# Patient Record
Sex: Male | Born: 1955 | Race: White | Hispanic: No | Smoking: Former smoker
Health system: Southern US, Community
[De-identification: ages and names within clinical notes are randomized; demographics above are authoritative.]

## PROBLEM LIST (undated history)

## (undated) DIAGNOSIS — I10 Essential (primary) hypertension: Secondary | ICD-10-CM

## (undated) DIAGNOSIS — I5032 Chronic diastolic (congestive) heart failure: Secondary | ICD-10-CM

## (undated) DIAGNOSIS — R51 Headache: Secondary | ICD-10-CM

## (undated) DIAGNOSIS — G8929 Other chronic pain: Secondary | ICD-10-CM

## (undated) DIAGNOSIS — F32A Depression, unspecified: Secondary | ICD-10-CM

## (undated) DIAGNOSIS — I2510 Atherosclerotic heart disease of native coronary artery without angina pectoris: Secondary | ICD-10-CM

## (undated) DIAGNOSIS — M25559 Pain in unspecified hip: Secondary | ICD-10-CM

## (undated) DIAGNOSIS — F419 Anxiety disorder, unspecified: Secondary | ICD-10-CM

## (undated) DIAGNOSIS — F1411 Cocaine abuse, in remission: Secondary | ICD-10-CM

## (undated) DIAGNOSIS — K219 Gastro-esophageal reflux disease without esophagitis: Secondary | ICD-10-CM

## (undated) DIAGNOSIS — F1011 Alcohol abuse, in remission: Secondary | ICD-10-CM

## (undated) DIAGNOSIS — I509 Heart failure, unspecified: Secondary | ICD-10-CM

## (undated) DIAGNOSIS — J449 Chronic obstructive pulmonary disease, unspecified: Secondary | ICD-10-CM

## (undated) DIAGNOSIS — M1990 Unspecified osteoarthritis, unspecified site: Secondary | ICD-10-CM

## (undated) DIAGNOSIS — M549 Dorsalgia, unspecified: Secondary | ICD-10-CM

## (undated) DIAGNOSIS — F329 Major depressive disorder, single episode, unspecified: Secondary | ICD-10-CM

## (undated) DIAGNOSIS — E785 Hyperlipidemia, unspecified: Secondary | ICD-10-CM

## (undated) DIAGNOSIS — E669 Obesity, unspecified: Secondary | ICD-10-CM

## (undated) DIAGNOSIS — J9851 Mediastinitis: Secondary | ICD-10-CM

## (undated) DIAGNOSIS — J189 Pneumonia, unspecified organism: Secondary | ICD-10-CM

## (undated) HISTORY — DX: Depression, unspecified: F32.A

## (undated) HISTORY — DX: Hyperlipidemia, unspecified: E78.5

## (undated) HISTORY — PX: CHOLECYSTECTOMY: SHX55

## (undated) HISTORY — DX: Other chronic pain: G89.29

## (undated) HISTORY — DX: Major depressive disorder, single episode, unspecified: F32.9

## (undated) HISTORY — DX: Dorsalgia, unspecified: M54.9

## (undated) HISTORY — PX: BACK SURGERY: SHX140

## (undated) HISTORY — PX: HIP SURGERY: SHX245

## (undated) HISTORY — DX: Cocaine abuse, in remission: F14.11

## (undated) HISTORY — DX: Obesity, unspecified: E66.9

## (undated) HISTORY — PX: NASAL/SINUS ENDOSCOPY: SHX288

## (undated) HISTORY — DX: Alcohol abuse, in remission: F10.11

## (undated) HISTORY — DX: Chronic obstructive pulmonary disease, unspecified: J44.9

## (undated) HISTORY — PX: CORONARY ARTERY BYPASS GRAFT: SHX141

## (undated) HISTORY — PX: TOTAL HIP ARTHROPLASTY: SHX124

## (undated) HISTORY — DX: Anxiety disorder, unspecified: F41.9

## (undated) HISTORY — PX: JOINT REPLACEMENT: SHX530

---

## 2005-10-19 ENCOUNTER — Ambulatory Visit: Payer: Self-pay | Admitting: Cardiology

## 2005-10-19 ENCOUNTER — Observation Stay (HOSPITAL_COMMUNITY): Admission: EM | Admit: 2005-10-19 | Discharge: 2005-10-20 | Payer: Self-pay | Admitting: Cardiology

## 2006-03-18 ENCOUNTER — Ambulatory Visit: Payer: Self-pay | Admitting: Family Medicine

## 2006-03-18 ENCOUNTER — Inpatient Hospital Stay (HOSPITAL_COMMUNITY): Admission: EM | Admit: 2006-03-18 | Discharge: 2006-03-18 | Payer: Self-pay | Admitting: Emergency Medicine

## 2006-03-28 ENCOUNTER — Inpatient Hospital Stay (HOSPITAL_COMMUNITY): Admission: EM | Admit: 2006-03-28 | Discharge: 2006-03-31 | Payer: Self-pay | Admitting: *Deleted

## 2006-03-29 ENCOUNTER — Encounter (INDEPENDENT_AMBULATORY_CARE_PROVIDER_SITE_OTHER): Payer: Self-pay | Admitting: Cardiovascular Disease

## 2006-04-07 ENCOUNTER — Ambulatory Visit: Payer: Self-pay | Admitting: Family Medicine

## 2006-04-19 ENCOUNTER — Ambulatory Visit: Payer: Self-pay | Admitting: Sports Medicine

## 2006-04-20 ENCOUNTER — Ambulatory Visit (HOSPITAL_COMMUNITY): Admission: RE | Admit: 2006-04-20 | Discharge: 2006-04-20 | Payer: Self-pay | Admitting: Internal Medicine

## 2006-04-20 ENCOUNTER — Encounter: Payer: Self-pay | Admitting: Vascular Surgery

## 2006-05-19 ENCOUNTER — Ambulatory Visit: Payer: Self-pay | Admitting: Sports Medicine

## 2006-06-08 ENCOUNTER — Emergency Department (HOSPITAL_COMMUNITY): Admission: EM | Admit: 2006-06-08 | Discharge: 2006-06-08 | Payer: Self-pay | Admitting: Emergency Medicine

## 2006-06-26 ENCOUNTER — Emergency Department (HOSPITAL_COMMUNITY): Admission: EM | Admit: 2006-06-26 | Discharge: 2006-06-26 | Payer: Self-pay | Admitting: Emergency Medicine

## 2006-07-12 ENCOUNTER — Ambulatory Visit: Payer: Self-pay | Admitting: Family Medicine

## 2006-08-09 ENCOUNTER — Emergency Department (HOSPITAL_COMMUNITY): Admission: EM | Admit: 2006-08-09 | Discharge: 2006-08-09 | Payer: Self-pay | Admitting: Emergency Medicine

## 2006-09-05 ENCOUNTER — Ambulatory Visit: Payer: Self-pay | Admitting: Sports Medicine

## 2006-09-06 ENCOUNTER — Ambulatory Visit (HOSPITAL_COMMUNITY): Admission: RE | Admit: 2006-09-06 | Discharge: 2006-09-06 | Payer: Self-pay | Admitting: Sports Medicine

## 2006-09-19 ENCOUNTER — Emergency Department (HOSPITAL_COMMUNITY): Admission: EM | Admit: 2006-09-19 | Discharge: 2006-09-19 | Payer: Self-pay | Admitting: Emergency Medicine

## 2006-10-04 ENCOUNTER — Ambulatory Visit: Payer: Self-pay | Admitting: Family Medicine

## 2006-10-20 ENCOUNTER — Ambulatory Visit: Payer: Self-pay | Admitting: Family Medicine

## 2006-10-20 DIAGNOSIS — M545 Low back pain, unspecified: Secondary | ICD-10-CM | POA: Insufficient documentation

## 2006-10-20 DIAGNOSIS — I739 Peripheral vascular disease, unspecified: Secondary | ICD-10-CM | POA: Insufficient documentation

## 2006-10-20 DIAGNOSIS — F32A Depression, unspecified: Secondary | ICD-10-CM | POA: Insufficient documentation

## 2006-10-20 DIAGNOSIS — J449 Chronic obstructive pulmonary disease, unspecified: Secondary | ICD-10-CM | POA: Insufficient documentation

## 2006-10-20 DIAGNOSIS — I10 Essential (primary) hypertension: Secondary | ICD-10-CM | POA: Insufficient documentation

## 2006-10-20 DIAGNOSIS — F329 Major depressive disorder, single episode, unspecified: Secondary | ICD-10-CM

## 2006-10-25 ENCOUNTER — Encounter (INDEPENDENT_AMBULATORY_CARE_PROVIDER_SITE_OTHER): Payer: Self-pay | Admitting: Family Medicine

## 2006-12-30 ENCOUNTER — Telehealth: Payer: Self-pay | Admitting: Family Medicine

## 2007-01-19 ENCOUNTER — Ambulatory Visit: Payer: Self-pay | Admitting: Family Medicine

## 2007-01-19 ENCOUNTER — Encounter: Payer: Self-pay | Admitting: Family Medicine

## 2007-01-19 DIAGNOSIS — E785 Hyperlipidemia, unspecified: Secondary | ICD-10-CM | POA: Insufficient documentation

## 2007-01-19 DIAGNOSIS — F411 Generalized anxiety disorder: Secondary | ICD-10-CM | POA: Insufficient documentation

## 2007-01-19 LAB — CONVERTED CEMR LAB
BUN: 17 mg/dL (ref 6–23)
Calcium: 9.6 mg/dL (ref 8.4–10.5)
Creatinine, Ser: 1.05 mg/dL (ref 0.40–1.50)
Direct LDL: 105 mg/dL — ABNORMAL HIGH
Glucose, Bld: 74 mg/dL (ref 70–99)
Potassium: 3.8 meq/L (ref 3.5–5.3)

## 2007-01-20 ENCOUNTER — Encounter: Payer: Self-pay | Admitting: Family Medicine

## 2007-01-29 ENCOUNTER — Inpatient Hospital Stay (HOSPITAL_COMMUNITY): Admission: EM | Admit: 2007-01-29 | Discharge: 2007-02-02 | Payer: Self-pay | Admitting: Emergency Medicine

## 2007-01-29 ENCOUNTER — Ambulatory Visit: Payer: Self-pay | Admitting: Internal Medicine

## 2007-02-01 ENCOUNTER — Ambulatory Visit: Payer: Self-pay | Admitting: Cardiology

## 2007-02-02 ENCOUNTER — Other Ambulatory Visit: Payer: Self-pay | Admitting: Internal Medicine

## 2007-03-03 ENCOUNTER — Emergency Department (HOSPITAL_COMMUNITY): Admission: EM | Admit: 2007-03-03 | Discharge: 2007-03-03 | Payer: Self-pay | Admitting: Emergency Medicine

## 2007-03-10 ENCOUNTER — Emergency Department (HOSPITAL_COMMUNITY): Admission: EM | Admit: 2007-03-10 | Discharge: 2007-03-10 | Payer: Self-pay | Admitting: Emergency Medicine

## 2007-06-13 ENCOUNTER — Emergency Department (HOSPITAL_COMMUNITY): Admission: EM | Admit: 2007-06-13 | Discharge: 2007-06-13 | Payer: Self-pay | Admitting: Emergency Medicine

## 2007-07-10 ENCOUNTER — Encounter (INDEPENDENT_AMBULATORY_CARE_PROVIDER_SITE_OTHER): Payer: Self-pay | Admitting: General Surgery

## 2007-07-10 ENCOUNTER — Ambulatory Visit (HOSPITAL_COMMUNITY): Admission: RE | Admit: 2007-07-10 | Discharge: 2007-07-10 | Payer: Self-pay | Admitting: General Surgery

## 2007-07-13 ENCOUNTER — Emergency Department (HOSPITAL_COMMUNITY): Admission: EM | Admit: 2007-07-13 | Discharge: 2007-07-13 | Payer: Self-pay | Admitting: Emergency Medicine

## 2007-08-21 ENCOUNTER — Encounter (INDEPENDENT_AMBULATORY_CARE_PROVIDER_SITE_OTHER): Payer: Self-pay | Admitting: *Deleted

## 2007-08-21 ENCOUNTER — Ambulatory Visit: Payer: Self-pay | Admitting: Sports Medicine

## 2007-08-21 ENCOUNTER — Encounter (INDEPENDENT_AMBULATORY_CARE_PROVIDER_SITE_OTHER): Payer: Self-pay | Admitting: Family Medicine

## 2007-09-11 ENCOUNTER — Encounter
Admission: RE | Admit: 2007-09-11 | Discharge: 2007-09-11 | Payer: Self-pay | Admitting: Physical Medicine & Rehabilitation

## 2007-09-12 ENCOUNTER — Telehealth: Payer: Self-pay | Admitting: Family Medicine

## 2007-09-18 ENCOUNTER — Ambulatory Visit: Payer: Self-pay | Admitting: Family Medicine

## 2007-09-27 ENCOUNTER — Emergency Department (HOSPITAL_COMMUNITY): Admission: EM | Admit: 2007-09-27 | Discharge: 2007-09-27 | Payer: Self-pay | Admitting: Emergency Medicine

## 2007-09-29 ENCOUNTER — Encounter (INDEPENDENT_AMBULATORY_CARE_PROVIDER_SITE_OTHER): Payer: Self-pay | Admitting: Family Medicine

## 2007-10-05 ENCOUNTER — Ambulatory Visit: Payer: Self-pay | Admitting: Family Medicine

## 2007-10-05 DIAGNOSIS — K449 Diaphragmatic hernia without obstruction or gangrene: Secondary | ICD-10-CM | POA: Insufficient documentation

## 2007-10-06 ENCOUNTER — Telehealth (INDEPENDENT_AMBULATORY_CARE_PROVIDER_SITE_OTHER): Payer: Self-pay | Admitting: Family Medicine

## 2007-10-15 ENCOUNTER — Encounter: Admission: RE | Admit: 2007-10-15 | Discharge: 2007-10-15 | Payer: Self-pay | Admitting: Neurosurgery

## 2007-11-04 ENCOUNTER — Emergency Department (HOSPITAL_COMMUNITY): Admission: EM | Admit: 2007-11-04 | Discharge: 2007-11-04 | Payer: Self-pay | Admitting: Emergency Medicine

## 2007-11-10 ENCOUNTER — Ambulatory Visit: Payer: Self-pay | Admitting: Family Medicine

## 2007-11-20 ENCOUNTER — Emergency Department (HOSPITAL_COMMUNITY): Admission: EM | Admit: 2007-11-20 | Discharge: 2007-11-20 | Payer: Self-pay | Admitting: Emergency Medicine

## 2007-12-05 ENCOUNTER — Ambulatory Visit (HOSPITAL_COMMUNITY): Admission: RE | Admit: 2007-12-05 | Discharge: 2007-12-05 | Payer: Self-pay | Admitting: Family Medicine

## 2007-12-05 ENCOUNTER — Ambulatory Visit: Payer: Self-pay | Admitting: Family Medicine

## 2007-12-05 ENCOUNTER — Emergency Department (HOSPITAL_COMMUNITY): Admission: EM | Admit: 2007-12-05 | Discharge: 2007-12-05 | Payer: Self-pay | Admitting: Emergency Medicine

## 2007-12-06 ENCOUNTER — Telehealth (INDEPENDENT_AMBULATORY_CARE_PROVIDER_SITE_OTHER): Payer: Self-pay | Admitting: Family Medicine

## 2007-12-11 ENCOUNTER — Ambulatory Visit: Payer: Self-pay | Admitting: Family Medicine

## 2007-12-11 ENCOUNTER — Encounter (INDEPENDENT_AMBULATORY_CARE_PROVIDER_SITE_OTHER): Payer: Self-pay | Admitting: Family Medicine

## 2007-12-12 ENCOUNTER — Encounter: Payer: Self-pay | Admitting: Family Medicine

## 2007-12-12 ENCOUNTER — Ambulatory Visit: Payer: Self-pay | Admitting: Family Medicine

## 2007-12-12 ENCOUNTER — Inpatient Hospital Stay (HOSPITAL_COMMUNITY): Admission: EM | Admit: 2007-12-12 | Discharge: 2007-12-13 | Payer: Self-pay | Admitting: Emergency Medicine

## 2007-12-21 ENCOUNTER — Ambulatory Visit: Payer: Self-pay | Admitting: Internal Medicine

## 2007-12-22 ENCOUNTER — Observation Stay (HOSPITAL_COMMUNITY): Admission: EM | Admit: 2007-12-22 | Discharge: 2007-12-22 | Payer: Self-pay | Admitting: Emergency Medicine

## 2007-12-28 ENCOUNTER — Inpatient Hospital Stay (HOSPITAL_COMMUNITY): Admission: RE | Admit: 2007-12-28 | Discharge: 2007-12-30 | Payer: Self-pay | Admitting: Neurosurgery

## 2008-01-03 ENCOUNTER — Telehealth (INDEPENDENT_AMBULATORY_CARE_PROVIDER_SITE_OTHER): Payer: Self-pay | Admitting: Family Medicine

## 2008-01-11 ENCOUNTER — Telehealth (INDEPENDENT_AMBULATORY_CARE_PROVIDER_SITE_OTHER): Payer: Self-pay | Admitting: Family Medicine

## 2008-01-29 ENCOUNTER — Ambulatory Visit: Payer: Self-pay | Admitting: Family Medicine

## 2008-02-02 ENCOUNTER — Encounter (INDEPENDENT_AMBULATORY_CARE_PROVIDER_SITE_OTHER): Payer: Self-pay | Admitting: Family Medicine

## 2008-02-09 ENCOUNTER — Telehealth (INDEPENDENT_AMBULATORY_CARE_PROVIDER_SITE_OTHER): Payer: Self-pay | Admitting: Family Medicine

## 2008-02-12 ENCOUNTER — Telehealth (INDEPENDENT_AMBULATORY_CARE_PROVIDER_SITE_OTHER): Payer: Self-pay | Admitting: *Deleted

## 2008-03-17 ENCOUNTER — Ambulatory Visit: Payer: Self-pay | Admitting: Cardiology

## 2008-03-19 ENCOUNTER — Ambulatory Visit: Payer: Self-pay | Admitting: Family Medicine

## 2008-03-20 ENCOUNTER — Telehealth: Payer: Self-pay | Admitting: *Deleted

## 2008-03-21 ENCOUNTER — Telehealth (INDEPENDENT_AMBULATORY_CARE_PROVIDER_SITE_OTHER): Payer: Self-pay | Admitting: Family Medicine

## 2008-03-27 ENCOUNTER — Encounter (INDEPENDENT_AMBULATORY_CARE_PROVIDER_SITE_OTHER): Payer: Self-pay | Admitting: Family Medicine

## 2008-04-10 ENCOUNTER — Telehealth (INDEPENDENT_AMBULATORY_CARE_PROVIDER_SITE_OTHER): Payer: Self-pay | Admitting: *Deleted

## 2008-04-26 ENCOUNTER — Ambulatory Visit: Payer: Self-pay | Admitting: Family Medicine

## 2008-04-26 ENCOUNTER — Telehealth: Payer: Self-pay | Admitting: *Deleted

## 2008-04-26 DIAGNOSIS — G4700 Insomnia, unspecified: Secondary | ICD-10-CM | POA: Insufficient documentation

## 2008-05-03 ENCOUNTER — Emergency Department (HOSPITAL_COMMUNITY): Admission: EM | Admit: 2008-05-03 | Discharge: 2008-05-03 | Payer: Self-pay | Admitting: Emergency Medicine

## 2008-05-05 ENCOUNTER — Telehealth (INDEPENDENT_AMBULATORY_CARE_PROVIDER_SITE_OTHER): Payer: Self-pay | Admitting: *Deleted

## 2008-05-06 ENCOUNTER — Encounter (INDEPENDENT_AMBULATORY_CARE_PROVIDER_SITE_OTHER): Payer: Self-pay | Admitting: Family Medicine

## 2008-05-13 ENCOUNTER — Encounter (INDEPENDENT_AMBULATORY_CARE_PROVIDER_SITE_OTHER): Payer: Self-pay | Admitting: Family Medicine

## 2008-05-13 ENCOUNTER — Telehealth (INDEPENDENT_AMBULATORY_CARE_PROVIDER_SITE_OTHER): Payer: Self-pay | Admitting: Family Medicine

## 2008-05-17 ENCOUNTER — Emergency Department (HOSPITAL_COMMUNITY): Admission: EM | Admit: 2008-05-17 | Discharge: 2008-05-18 | Payer: Self-pay | Admitting: Emergency Medicine

## 2008-05-29 ENCOUNTER — Ambulatory Visit: Payer: Self-pay | Admitting: Family Medicine

## 2008-06-06 ENCOUNTER — Ambulatory Visit: Payer: Self-pay | Admitting: Internal Medicine

## 2008-06-06 DIAGNOSIS — Z77090 Contact with and (suspected) exposure to asbestos: Secondary | ICD-10-CM | POA: Insufficient documentation

## 2008-06-11 ENCOUNTER — Telehealth (INDEPENDENT_AMBULATORY_CARE_PROVIDER_SITE_OTHER): Payer: Self-pay | Admitting: *Deleted

## 2008-06-18 ENCOUNTER — Telehealth (INDEPENDENT_AMBULATORY_CARE_PROVIDER_SITE_OTHER): Payer: Self-pay | Admitting: Family Medicine

## 2008-06-24 ENCOUNTER — Telehealth (INDEPENDENT_AMBULATORY_CARE_PROVIDER_SITE_OTHER): Payer: Self-pay | Admitting: *Deleted

## 2008-08-29 ENCOUNTER — Emergency Department (HOSPITAL_COMMUNITY): Admission: EM | Admit: 2008-08-29 | Discharge: 2008-08-29 | Payer: Self-pay | Admitting: Emergency Medicine

## 2008-09-20 ENCOUNTER — Telehealth: Payer: Self-pay | Admitting: Internal Medicine

## 2008-09-25 ENCOUNTER — Encounter: Payer: Self-pay | Admitting: Internal Medicine

## 2008-10-08 ENCOUNTER — Ambulatory Visit: Payer: Self-pay | Admitting: Internal Medicine

## 2008-10-14 ENCOUNTER — Ambulatory Visit (HOSPITAL_COMMUNITY): Admission: RE | Admit: 2008-10-14 | Discharge: 2008-10-14 | Payer: Self-pay | Admitting: Internal Medicine

## 2008-10-22 ENCOUNTER — Ambulatory Visit: Payer: Self-pay | Admitting: Internal Medicine

## 2008-12-10 ENCOUNTER — Emergency Department (HOSPITAL_COMMUNITY): Admission: EM | Admit: 2008-12-10 | Discharge: 2008-12-10 | Payer: Self-pay | Admitting: Emergency Medicine

## 2008-12-18 ENCOUNTER — Ambulatory Visit: Payer: Self-pay | Admitting: Family Medicine

## 2009-02-18 ENCOUNTER — Encounter (INDEPENDENT_AMBULATORY_CARE_PROVIDER_SITE_OTHER): Payer: Self-pay | Admitting: Family Medicine

## 2009-03-14 ENCOUNTER — Emergency Department (HOSPITAL_COMMUNITY): Admission: EM | Admit: 2009-03-14 | Discharge: 2009-03-14 | Payer: Self-pay | Admitting: Emergency Medicine

## 2009-03-28 ENCOUNTER — Emergency Department (HOSPITAL_COMMUNITY): Admission: EM | Admit: 2009-03-28 | Discharge: 2009-03-28 | Payer: Self-pay | Admitting: Emergency Medicine

## 2009-05-26 ENCOUNTER — Telehealth: Payer: Self-pay | Admitting: Family Medicine

## 2009-05-26 ENCOUNTER — Encounter: Payer: Self-pay | Admitting: *Deleted

## 2009-08-01 ENCOUNTER — Emergency Department (HOSPITAL_COMMUNITY): Admission: EM | Admit: 2009-08-01 | Discharge: 2009-08-02 | Payer: Self-pay | Admitting: Emergency Medicine

## 2010-06-27 ENCOUNTER — Emergency Department (HOSPITAL_COMMUNITY): Admission: EM | Admit: 2010-06-27 | Discharge: 2010-06-28 | Payer: Self-pay | Admitting: Emergency Medicine

## 2010-07-30 ENCOUNTER — Emergency Department (HOSPITAL_COMMUNITY): Admission: EM | Admit: 2010-07-30 | Discharge: 2010-01-09 | Payer: Self-pay | Admitting: Emergency Medicine

## 2010-07-31 ENCOUNTER — Telehealth: Payer: Self-pay | Admitting: Family Medicine

## 2010-09-13 ENCOUNTER — Encounter: Payer: Self-pay | Admitting: Internal Medicine

## 2010-09-13 ENCOUNTER — Encounter: Payer: Self-pay | Admitting: Neurosurgery

## 2010-09-13 ENCOUNTER — Encounter: Payer: Self-pay | Admitting: Family Medicine

## 2010-09-22 NOTE — Progress Notes (Signed)
   Patient sent Rx request for DuoNeb. Rx 1 with no refills. Ask patient to make appointment to meet new doc and review his medicines. I like to see patients that are on his types of medications every 3 months. I will not continue to Rx medications without an office visit.

## 2010-09-22 NOTE — Progress Notes (Signed)
Summary: UPDATE PROBLEM LIST   

## 2010-11-03 LAB — DIFFERENTIAL
Basophils Absolute: 0.1 10*3/uL (ref 0.0–0.1)
Eosinophils Absolute: 0.5 10*3/uL (ref 0.0–0.7)
Eosinophils Relative: 3 % (ref 0–5)
Lymphocytes Relative: 24 % (ref 12–46)
Monocytes Absolute: 1.4 10*3/uL — ABNORMAL HIGH (ref 0.1–1.0)

## 2010-11-03 LAB — BLOOD GAS, ARTERIAL
Acid-Base Excess: 1.2 mmol/L (ref 0.0–2.0)
FIO2: 0.21 %
O2 Saturation: 94 %
Patient temperature: 37
pCO2 arterial: 39 mmHg (ref 35.0–45.0)
pO2, Arterial: 66 mmHg — ABNORMAL LOW (ref 80.0–100.0)

## 2010-11-03 LAB — BASIC METABOLIC PANEL
BUN: 11 mg/dL (ref 6–23)
CO2: 26 mEq/L (ref 19–32)
Chloride: 102 mEq/L (ref 96–112)
Creatinine, Ser: 1.13 mg/dL (ref 0.4–1.5)
Glucose, Bld: 95 mg/dL (ref 70–99)
Potassium: 3.5 mEq/L (ref 3.5–5.1)

## 2010-11-03 LAB — CBC
HCT: 38.7 % — ABNORMAL LOW (ref 39.0–52.0)
MCH: 32.1 pg (ref 26.0–34.0)
MCV: 94.3 fL (ref 78.0–100.0)
Platelets: 263 10*3/uL (ref 150–400)
RDW: 13 % (ref 11.5–15.5)
WBC: 13.7 10*3/uL — ABNORMAL HIGH (ref 4.0–10.5)

## 2010-11-03 LAB — POCT CARDIAC MARKERS

## 2010-11-04 ENCOUNTER — Emergency Department (HOSPITAL_COMMUNITY)
Admission: EM | Admit: 2010-11-04 | Discharge: 2010-11-04 | Disposition: A | Discharge status: Home / Self Care | Payer: Medicare Other | Attending: Emergency Medicine | Admitting: Emergency Medicine

## 2010-11-04 ENCOUNTER — Emergency Department (HOSPITAL_COMMUNITY): Payer: Medicare Other

## 2010-11-04 DIAGNOSIS — R0602 Shortness of breath: Secondary | ICD-10-CM | POA: Insufficient documentation

## 2010-11-04 DIAGNOSIS — R071 Chest pain on breathing: Secondary | ICD-10-CM | POA: Insufficient documentation

## 2010-11-04 DIAGNOSIS — R079 Chest pain, unspecified: Secondary | ICD-10-CM | POA: Insufficient documentation

## 2010-11-04 LAB — CBC
HCT: 35.9 % — ABNORMAL LOW (ref 39.0–52.0)
Hemoglobin: 12.4 g/dL — ABNORMAL LOW (ref 13.0–17.0)
MCHC: 34.5 g/dL (ref 30.0–36.0)
RBC: 4.01 MIL/uL — ABNORMAL LOW (ref 4.22–5.81)

## 2010-11-04 LAB — COMPREHENSIVE METABOLIC PANEL
ALT: 20 U/L (ref 0–53)
AST: 21 U/L (ref 0–37)
Albumin: 3.4 g/dL — ABNORMAL LOW (ref 3.5–5.2)
CO2: 26 mEq/L (ref 19–32)
Chloride: 103 mEq/L (ref 96–112)
GFR calc Af Amer: >60 mL/min (ref >60)
GFR calc non Af Amer: >60 mL/min (ref >60)
Potassium: 4 mEq/L (ref 3.5–5.1)
Sodium: 136 mEq/L (ref 135–145)
Total Bilirubin: 0.3 mg/dL (ref 0.3–1.2)

## 2010-11-04 LAB — DIFFERENTIAL
Basophils Absolute: 0.1 10*3/uL (ref 0.0–0.1)
Lymphocytes Relative: 26 % (ref 12–46)
Monocytes Absolute: 0.8 10*3/uL (ref 0.1–1.0)
Monocytes Relative: 8 % (ref 3–12)
Neutro Abs: 6.3 10*3/uL (ref 1.7–7.7)
Neutrophils Relative %: 63 % (ref 43–77)

## 2010-11-09 LAB — BLOOD GAS, ARTERIAL
Acid-base deficit: 2 mmol/L (ref 0.0–2.0)
Bicarbonate: 21.2 mEq/L (ref 20.0–24.0)
O2 Saturation: 96.1 %
Patient temperature: 37
TCO2: 18.4 mmol/L (ref 0–100)
pH, Arterial: 7.463 — ABNORMAL HIGH (ref 7.350–7.450)

## 2010-11-09 LAB — BASIC METABOLIC PANEL
CO2: 23 mEq/L (ref 19–32)
Calcium: 9.3 mg/dL (ref 8.4–10.5)
Chloride: 102 mEq/L (ref 96–112)
GFR calc Af Amer: >60 mL/min (ref >60)
Glucose, Bld: 173 mg/dL — ABNORMAL HIGH (ref 70–99)
Sodium: 135 mEq/L (ref 135–145)

## 2010-11-09 LAB — CBC
HCT: 38.3 % — ABNORMAL LOW (ref 39.0–52.0)
Hemoglobin: 13.8 g/dL (ref 13.0–17.0)
MCHC: 36 g/dL (ref 30.0–36.0)
MCV: 94.4 fL (ref 78.0–100.0)
RBC: 4.05 MIL/uL — ABNORMAL LOW (ref 4.22–5.81)
RDW: 12.8 % (ref 11.5–15.5)

## 2010-11-09 LAB — DIFFERENTIAL
Basophils Absolute: 0.1 10*3/uL (ref 0.0–0.1)
Basophils Relative: 0 % (ref 0–1)
Eosinophils Absolute: 0.3 10*3/uL (ref 0.0–0.7)
Eosinophils Relative: 2 % (ref 0–5)
Monocytes Absolute: 1.4 10*3/uL — ABNORMAL HIGH (ref 0.1–1.0)
Monocytes Relative: 9 % (ref 3–12)
Neutro Abs: 11.8 10*3/uL — ABNORMAL HIGH (ref 1.7–7.7)

## 2010-11-10 ENCOUNTER — Telehealth: Payer: Self-pay | Admitting: Internal Medicine

## 2010-11-11 ENCOUNTER — Emergency Department (HOSPITAL_COMMUNITY)
Admission: EM | Admit: 2010-11-11 | Discharge: 2010-11-12 | Disposition: A | Discharge status: Home / Self Care | Payer: Medicare Other | Attending: Emergency Medicine | Admitting: Emergency Medicine

## 2010-11-11 ENCOUNTER — Emergency Department (HOSPITAL_COMMUNITY): Payer: Medicare Other

## 2010-11-11 DIAGNOSIS — E876 Hypokalemia: Secondary | ICD-10-CM | POA: Insufficient documentation

## 2010-11-11 DIAGNOSIS — G8929 Other chronic pain: Secondary | ICD-10-CM | POA: Insufficient documentation

## 2010-11-11 DIAGNOSIS — R079 Chest pain, unspecified: Secondary | ICD-10-CM | POA: Insufficient documentation

## 2010-11-11 DIAGNOSIS — I10 Essential (primary) hypertension: Secondary | ICD-10-CM | POA: Insufficient documentation

## 2010-11-11 DIAGNOSIS — E7800 Pure hypercholesterolemia, unspecified: Secondary | ICD-10-CM | POA: Insufficient documentation

## 2010-11-11 DIAGNOSIS — M549 Dorsalgia, unspecified: Secondary | ICD-10-CM | POA: Insufficient documentation

## 2010-11-11 LAB — BASIC METABOLIC PANEL
Chloride: 104 mEq/L (ref 96–112)
GFR calc non Af Amer: >60 mL/min (ref >60)
Potassium: 3.2 mEq/L — ABNORMAL LOW (ref 3.5–5.1)
Sodium: 137 mEq/L (ref 135–145)

## 2010-11-11 LAB — DIFFERENTIAL
Eosinophils Absolute: 0.5 10*3/uL (ref 0.0–0.7)
Eosinophils Relative: 4 % (ref 0–5)
Lymphocytes Relative: 34 % (ref 12–46)
Lymphs Abs: 4.1 10*3/uL — ABNORMAL HIGH (ref 0.7–4.0)
Monocytes Relative: 12 % (ref 3–12)

## 2010-11-11 LAB — CBC
HCT: 39.4 % (ref 39.0–52.0)
MCH: 31.3 pg (ref 26.0–34.0)
MCV: 91.4 fL (ref 78.0–100.0)
RDW: 12.8 % (ref 11.5–15.5)
WBC: 11.9 10*3/uL — ABNORMAL HIGH (ref 4.0–10.5)

## 2010-11-11 LAB — POCT CARDIAC MARKERS
CKMB, poc: 2.5 ng/mL (ref 1.0–8.0)
Troponin i, poc: <0.05 ng/mL (ref 0.00–0.09)

## 2010-11-11 LAB — D-DIMER, QUANTITATIVE: D-Dimer, Quant: 0.22 ug/mL-FEU (ref 0.00–0.48)

## 2010-11-12 LAB — POCT CARDIAC MARKERS
CKMB, poc: 1.3 ng/mL (ref 1.0–8.0)
Myoglobin, poc: 112 ng/mL (ref 12–200)
Troponin i, poc: <0.05 ng/mL (ref 0.00–0.09)

## 2010-11-19 NOTE — Progress Notes (Signed)
Summary: re stopping ace inhibitor - march 2010  ---- Converted from flag ---- ---- 10/30/2008 8:41 AM, Paulene Floor MD wrote: sure thing.  I have not seen hims since October and he has no showed or canceled 4 appointments with me since then!  I will certainly address his lisinopril the next time I see him.  mary  ---- 10/29/2008 2:59 AM, Kalman Shan MD wrote: Dear Dr. Yetta Barre  I think his lisinopril is making cough worse. COuld you please switch him to another antihypertensive?  Thanks  Bridget Westbrooks ------------------------------

## 2010-11-19 NOTE — Progress Notes (Signed)
Summary:  re stopping ace inhibitor in view of cough. March 2010  ---- Converted from flag ---- ---- 10/30/2008 8:41 AM, Paulene Floor MD wrote: sure thing.  I have not seen hims since October and he has no showed or canceled 4 appointments with me since then!  I will certainly address his lisinopril the next time I see him.  mary  ---- 10/29/2008 2:59 AM, Kalman Shan MD wrote: Dear Dr. Yetta Barre  I think his lisinopril is making cough worse. COuld you please switch him to another antihypertensive?  Thanks  [name] ------------------------------

## 2010-11-24 LAB — DIFFERENTIAL
Basophils Relative: 1 % (ref 0–1)
Eosinophils Absolute: 0.4 10*3/uL (ref 0.0–0.7)
Monocytes Relative: 8 % (ref 3–12)
Neutrophils Relative %: 69 % (ref 43–77)

## 2010-11-24 LAB — CBC
MCHC: 34.7 g/dL (ref 30.0–36.0)
MCV: 96.1 fL (ref 78.0–100.0)
Platelets: 245 10*3/uL (ref 150–400)
RBC: 4.48 MIL/uL (ref 4.22–5.81)
RDW: 13.2 % (ref 11.5–15.5)

## 2010-11-24 LAB — BLOOD GAS, ARTERIAL
Acid-base deficit: 2.7 mmol/L — ABNORMAL HIGH (ref 0.0–2.0)
Bicarbonate: 21.8 mEq/L (ref 20.0–24.0)
O2 Saturation: 97.6 %
Patient temperature: 37

## 2010-11-24 LAB — POCT CARDIAC MARKERS: Myoglobin, poc: 150 ng/mL (ref 12–200)

## 2010-11-24 LAB — BASIC METABOLIC PANEL
BUN: 14 mg/dL (ref 6–23)
CO2: 22 mEq/L (ref 19–32)
Chloride: 103 mEq/L (ref 96–112)
Creatinine, Ser: 0.83 mg/dL (ref 0.4–1.5)
Glucose, Bld: 124 mg/dL — ABNORMAL HIGH (ref 70–99)

## 2010-11-29 LAB — DIFFERENTIAL
Basophils Absolute: 0.1 10*3/uL (ref 0.0–0.1)
Basophils Relative: 1 % (ref 0–1)
Eosinophils Absolute: 0.4 K/uL (ref 0.0–0.7)
Eosinophils Relative: 4 % (ref 0–5)
Lymphocytes Relative: 26 % (ref 12–46)
Lymphs Abs: 2.6 K/uL (ref 0.7–4.0)
Monocytes Absolute: 1 K/uL (ref 0.1–1.0)
Monocytes Relative: 10 % (ref 3–12)
Neutro Abs: 5.9 10*3/uL (ref 1.7–7.7)
Neutrophils Relative %: 59 % (ref 43–77)

## 2010-11-29 LAB — POCT CARDIAC MARKERS
CKMB, poc: 1.2 ng/mL (ref 1.0–8.0)
Myoglobin, poc: 107 ng/mL (ref 12–200)
Troponin i, poc: <0.05 ng/mL (ref 0.00–0.09)

## 2010-11-29 LAB — COMPREHENSIVE METABOLIC PANEL WITH GFR
ALT: 23 U/L (ref 0–53)
AST: 33 U/L (ref 0–37)
Alkaline Phosphatase: 42 U/L (ref 39–117)
Calcium: 9.2 mg/dL (ref 8.4–10.5)
GFR calc Af Amer: >60 mL/min (ref >60)
Potassium: 3.6 meq/L (ref 3.5–5.1)
Sodium: 138 meq/L (ref 135–145)
Total Protein: 6.9 g/dL (ref 6.0–8.3)

## 2010-11-29 LAB — ETHANOL: Alcohol, Ethyl (B): <5 mg/dL (ref 0–10)

## 2010-11-29 LAB — COMPREHENSIVE METABOLIC PANEL
Albumin: 3.8 g/dL (ref 3.5–5.2)
BUN: 11 mg/dL (ref 6–23)
CO2: 24 mEq/L (ref 19–32)
Chloride: 106 mEq/L (ref 96–112)
Creatinine, Ser: 0.92 mg/dL (ref 0.4–1.5)
GFR calc non Af Amer: >60 mL/min (ref >60)
Glucose, Bld: 132 mg/dL — ABNORMAL HIGH (ref 70–99)
Total Bilirubin: 0.6 mg/dL (ref 0.3–1.2)

## 2010-11-29 LAB — CBC
HCT: 39.7 % (ref 39.0–52.0)
Hemoglobin: 14.1 g/dL (ref 13.0–17.0)
MCHC: 35.5 g/dL (ref 30.0–36.0)
MCV: 92.3 fL (ref 78.0–100.0)
Platelets: 249 10*3/uL (ref 150–400)
RBC: 4.3 MIL/uL (ref 4.22–5.81)
RDW: 13 % (ref 11.5–15.5)
WBC: 10 10*3/uL (ref 4.0–10.5)

## 2010-12-02 LAB — POCT CARDIAC MARKERS
Myoglobin, poc: 112 ng/mL (ref 12–200)
Myoglobin, poc: 95 ng/mL (ref 12–200)
Troponin i, poc: <0.05 ng/mL (ref 0.00–0.09)

## 2010-12-02 LAB — BASIC METABOLIC PANEL
BUN: 9 mg/dL (ref 6–23)
Calcium: 9.2 mg/dL (ref 8.4–10.5)
GFR calc non Af Amer: >60 mL/min (ref >60)
Glucose, Bld: 145 mg/dL — ABNORMAL HIGH (ref 70–99)

## 2010-12-02 LAB — DIFFERENTIAL
Basophils Absolute: 0.1 10*3/uL (ref 0.0–0.1)
Basophils Relative: 1 % (ref 0–1)
Eosinophils Relative: 5 % (ref 0–5)
Lymphocytes Relative: 25 % (ref 12–46)
Neutro Abs: 5.8 10*3/uL (ref 1.7–7.7)

## 2010-12-02 LAB — CBC
MCHC: 34.9 g/dL (ref 30.0–36.0)
Platelets: 272 10*3/uL (ref 150–400)
RDW: 13.7 % (ref 11.5–15.5)

## 2010-12-02 LAB — D-DIMER, QUANTITATIVE: D-Dimer, Quant: 0.56 ug/mL-FEU — ABNORMAL HIGH (ref 0.00–0.48)

## 2010-12-07 LAB — URINALYSIS, ROUTINE W REFLEX MICROSCOPIC
Bilirubin Urine: NEGATIVE
Glucose, UA: NEGATIVE mg/dL
Hgb urine dipstick: NEGATIVE
Protein, ur: NEGATIVE mg/dL
Urobilinogen, UA: 0.2 mg/dL (ref 0.0–1.0)

## 2010-12-07 LAB — POCT CARDIAC MARKERS
CKMB, poc: 1.3 ng/mL (ref 1.0–8.0)
CKMB, poc: 1.4 ng/mL (ref 1.0–8.0)
Myoglobin, poc: 115 ng/mL (ref 12–200)
Myoglobin, poc: 70.3 ng/mL (ref 12–200)

## 2010-12-07 LAB — COMPREHENSIVE METABOLIC PANEL
ALT: 21 U/L (ref 0–53)
AST: 24 U/L (ref 0–37)
Albumin: 3.7 g/dL (ref 3.5–5.2)
Alkaline Phosphatase: 47 U/L (ref 39–117)
BUN: 10 mg/dL (ref 6–23)
Chloride: 106 mEq/L (ref 96–112)
GFR calc Af Amer: >60 mL/min (ref >60)
Potassium: 3.5 mEq/L (ref 3.5–5.1)
Sodium: 134 mEq/L — ABNORMAL LOW (ref 135–145)
Total Bilirubin: 0.6 mg/dL (ref 0.3–1.2)
Total Protein: 7 g/dL (ref 6.0–8.3)

## 2010-12-07 LAB — RAPID URINE DRUG SCREEN, HOSP PERFORMED
Amphetamines: NOT DETECTED
Barbiturates: NOT DETECTED
Benzodiazepines: POSITIVE — AB
Tetrahydrocannabinol: NOT DETECTED

## 2010-12-07 LAB — DIFFERENTIAL
Basophils Absolute: 0.1 10*3/uL (ref 0.0–0.1)
Basophils Relative: 1 % (ref 0–1)
Eosinophils Absolute: 0.5 10*3/uL (ref 0.0–0.7)
Eosinophils Relative: 6 % — ABNORMAL HIGH (ref 0–5)
Monocytes Absolute: 1.1 10*3/uL — ABNORMAL HIGH (ref 0.1–1.0)
Monocytes Relative: 11 % (ref 3–12)

## 2010-12-07 LAB — CBC
HCT: 41.7 % (ref 39.0–52.0)
Platelets: 256 10*3/uL (ref 150–400)
RDW: 12.6 % (ref 11.5–15.5)
WBC: 9.9 10*3/uL (ref 4.0–10.5)

## 2010-12-07 LAB — BRAIN NATRIURETIC PEPTIDE: Pro B Natriuretic peptide (BNP): <30 pg/mL (ref 0.0–100.0)

## 2011-01-05 NOTE — Cardiovascular Report (Signed)
NAMEKHRYSTIAN, [name] NO.:  [id number]   MEDICAL RECORD NO.:  [id number]          PATIENT TYPE:  INP   LOCATION:  2034                         FACILITY:  [facility]   PHYSICIAN:  [name] Beals. Juanda Chance, MD, FACCDATE OF BIRTH:  1956/02/25   DATE OF PROCEDURE:  02/02/2007  DATE OF DISCHARGE:                            CARDIAC CATHETERIZATION   CLINICAL HISTORY:  [name] is 55 year old and was admitted to [name] with chest pain.  He has a history of hypertension and  hyperlipidemia.  Dr. Dietrich [name] saw him in consult and thought his pain  was somewhat concerning for ischemia and recommended evaluation with  angiography.  He had had previous noninvasive testing which has been  nondefinitive.  His ECG's and were normal and his markers were negative.   He had previous catheterization at [facility] and we did not have the  records of that.   PROCEDURE:  The procedure was performed via right femoral arterial using  arterial sheath and 6-French preformed coronary catheter.  A front wall  arterial puncture was performed and Omnipaque contrast was used.  We  used the right coronary artery for injecting the right coronary and the  anomalous left coronary off the right coronary cusp.  The patient's  right femoral was closed Angio-Seal at the end the procedure.  The  patient tolerated the procedure well and left laboratory in satisfactory  condition.   RESULTS:  The right coronary. Right coronary is a very large dominant  vessel gave rise to a large right ventricular branch, very large  posterior descending branch, two small posterolateral branches, and a  very large posterolateral branch.  These vessels were free of disease.   The left coronary artery arose from the right coronary cusp.  It may  have come off the very proximal portion of the right coronary artery  with a very short left main coronary artery.  This artery gave rise to a  left anterior descending  artery which gave rise to septal perforator and  diagonal branch and terminated before the apex.  The LAD was free of  disease.   This artery also gave rise to a circumflex artery which gave rise to a  marginal branch and two posterolateral branches.  These vessels appeared  to be free of disease.   The left ventriculogram performed in the RAO projection showed good wall  motion with no areas of hypokinesis.   The aortic pressure was 97/66 with mean of 81, left ventricular pressure  was 97/13.   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  2. Otherwise normal coronary angiography and no evidence of      obstructive coronary artery disease.   RECOMMENDATIONS:  The patient does not have any obstructive coronary  disease.  I cannot tell for certain from the initial review whether the  left coronary artery traverses between the pulmonary artery and aorta  which  potentially could put the patient at some risk or whether it traverses  behind the aorta.  I suspect the latter and will  review this with my  colleagues.  We may be able to let the patient go home later today.  I  think it is unlikely his symptoms are related to his anomalous coronary  artery.      [name] Elvera Lennox Juanda Chance, MD, [facility]  Electronically Signed     BRB/MEDQ  D:  02/02/2007  T:  02/02/2007  Job:  [id number]   cc:   [name] John T. Pamalee Leyden, MD  [name] Riffle, MD, [name]  [name] Friends. Dietrich Pates, MD, [name]

## 2011-01-05 NOTE — H&P (Signed)
NAMECOBURN, KNAUS NO.:  [id number]   MEDICAL RECORD NO.:  [id number]          PATIENT TYPE:  INP   LOCATION:  2034                         FACILITY:  [facility]   PHYSICIAN:  Gerrit Friends. Dietrich Pates, MD, FACCDATE OF BIRTH:  [birth year]   DATE OF ADMISSION:  02/01/2007  DATE OF DISCHARGE:                              HISTORY & PHYSICAL   REFERRING PHYSICIAN:  Dr. Rito Ehrlich, Hospitalist at [facility].   PRIMARY CARE PHYSICIAN:  Dr. Lynnea Ferrier.   PRIMARY CARDIOLOGIST:  Dr Dietrich Pates.   HISTORY OF PRESENT ILLNESS:  A 55 year old gentleman presenting to [facility] with chest pain and now transferred to [facility] for cardiac catheterization.  Mr. Scholle has no history  of cardiovascular disease.  He did undergo cardiac catheterization seven  years ago at [facility].  We have been unable to obtain records  regarding that procedure, but the patient was told that results were not  perfectly normal, although no intervention was required.  He presented  to [facility] with severe chest pressure.  He has had a  plethora of other associated symptoms including numbness in both hands,  paresthesias in the left arm, pain extending up both legs into his  pelvic region, etc.  Myocardial infarction was ruled out.  He continued  to have episodes of sharp left chest pain of moderate severity in-  hospital.  A stress test was attempted, but he was able to achieve only  a work load of 4 METS with minimal heart rate acceleration.  He  apparently has had a pharmacologic stress test in the past and refused  administration of adenosine, dipyridamole or dobutamine.  Rest images  had been obtained.  Stress imaging was performed after he complained of  left arm discomfort.  There were persistent defects in the anteroseptal  region and inferior region that did not clearly represent perfusion  abnormalities.  There was no evidence for  ischemia.  The left ventricle  was dilated with preserved systolic function.  Cardiac catheterization  is now recommended for definitive diagnosis in the absence of our  ability to obtain a reasonable noninvasive assessment.  The patient  understands the risks of the procedure and agrees to proceed.   There is information regarding an adenosine Myoview study in 2007 that  showed questionable mild ischemia in a segment of the myocardium with  preserved left ventricular systolic function.  An echocardiogram in  March 24, 2006 was reportedly normal.   ALLERGIES:  NONE.   MEDICATIONS ON ADMISSION:  1. Aspirin 325 mg daily.  2. Spiriva.  3. Lisinopril 10 mg daily.  4. Protonix 40 mg daily.  5. Niaspan 500 mg daily.  6. Hydrochlorothiazide 12.5 mg daily.  7. Ultracet p.r.n.  8. Prozac 20 mg daily.  9. Lovastatin 20 mg daily.   PAST MEDICAL HISTORY:  1. Hypertension.  2. Dyslipidemia.  3. GERD.  4. Question of peripheral vascular disease.   FAMILY HISTORY:  Positive for coronary disease.   SOCIAL HISTORY:  Tobacco use in the past; no  excessive use of alcohol.   REVIEW OF SYSTEMS:  Was updated without significant change.   PHYSICAL EXAMINATION:  GENERAL APPEARANCE:  A pleasant gentleman with  multiple tattoos in no acute distress.  VITAL SIGNS:  The heart rate is 60 and regular, blood pressure 125/55,  temperature 97.2, respirations 16.  Afebrile.  HEENT:  Unremarkable.  NECK:  No jugular venous distension; no carotid bruits.  LUNGS:  Clear.  CARDIAC:  Normal first and second heart sounds; no murmurs nor gallops  appreciated.  ABDOMEN:  Soft and nontender; no organomegaly.  EXTREMITIES:  2+ left dorsalis pedis pulse; 1+ pulses on the right; no  edema.   Laboratory studies at [facility] were unremarkable including  negative cardiac markers and a normal D-dimer.   EKG shows normal sinus rhythm, a right ventricular conduction delay and  is otherwise unremarkable.   An echocardiogram was repeated and was  unremarkable.   IMPRESSION:  [name] presents with somewhat worrisome chest  discomfort, but negative EKGs and negative cardiac markers.  There is  questionable history of coronary disease.  He continues to have symptoms  that are somewhat less worrisome in character, but will require  definitive diagnosis.  Cardiac catheterization is planned for today  (February 01, 2007).      Gerrit Friends. Dietrich Pates, MD, [facility]  Electronically Signed     RMR/MEDQ  D:  02/01/2007  T:  02/01/2007  Job:  [id number]

## 2011-01-05 NOTE — H&P (Signed)
[name], [name] NO.:  [id number]   MEDICAL RECORD NO.:  [id number]          PATIENT TYPE:  INP   LOCATION:  A217                          FACILITY:  [facility]   PHYSICIAN:  Skeet Latch, DO    DATE OF BIRTH:  1956/03/31   DATE OF ADMISSION:  12/21/2007  DATE OF DISCHARGE:  LH                              HISTORY & PHYSICAL   PRIMARY CARE Cheray Pardi:  Sylvan Cheese, M.D. of [facility].   CHIEF COMPLAINT:  Chest pain.   HISTORY OF THE PRESENT ILLNESS:  This is a 55 year old Caucasian male  who presents with the complaint of chest pain.  The patient has a  history of coronary artery disease, COPD and was recently discharged  from [facility] approximately a week ago for COPD, cough and  some similar chest pain.  The patient was released with the diagnoses of  cough and possible pneumonia.  The patient also had some chest pain  likely secondary to his coughing spells.  The patient was discharged and  told to follow up with his primary care physician and to follow up with  the neurosurgeon.   The patient presents to the ER today stating that yesterday he began to  have severe chest pain.  The patient states the pain has worsened to a 7-  8/10.  The patient states it is a pressure type sensation was a warm  feeling to his epigastrium with radiation to the left chest.  The  patient states that his pain started yesterday and happened all day, and  continued through to today.  The patient states that sometimes walking  made it feel better and at other times it did not.  The patient had some  associated lightheadedness and states that he had some ankle swelling  this A.M.  The patient at times states that he has had the sudden onset  of severe left sternal pain at times, but this has subsided.  Upon being  seen in the emergency room he was given 3 nitroglycerins with some  relief and states that IV pain medications also helped relieve some of  the  pain.   PAST MEDICAL HISTORY:  The patient's past medical history includes:  1. Generalized anxiety disorder.  2. Coronary artery disease.  3. Hyperlipidemia.  4. History of alcohol abuse.  5. History was tobacco abuse.  6. Hypertension.  7. Depression.  8. COPD.  9. Claudication.  10.Low back pain.   DRUG ALLERGIES:  No known drug allergies.   FAMILY HISTORY:  The patient states that his father is deceased from  heart disease in 2007.  His mother had a myocardial infarction at age  62.  She also has diabetes and Alzheimer's disease.  The patient states  that his brother is in the process of getting a stress test secondary to  chest discomfort.   PAST SURGICAL HISTORY:  Cholecystectomy.   SOCIAL HISTORY:  The patient is married.  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   MEDICATIONS:  The patient's home medications include:  1.  Aspirin daily.  2. Lisinopril 10 mg daily.  3. Lovastatin 20 mg daily.  4. Hydrochlorothiazide 12.5 mg daily.  5. Prozac 40 mg daily.  6. Xanax 1 mg every 8 hours as needed.   REVIEW OF SYSTEMS:  The review of systems is positive for chest pain,  lightheadedness, some ankle swelling, sweats, and difficulty breathing.  He also has a  productive cough.   PHYSICAL EXAMINATION:  VITAL SIGNS:  On physical exam vital signs show  temperature is 98.0, pulse is 87, respirations are 20, blood pressure  170/42, and sating at 97%, I believe on 2 liters.  GENERAL APPEARANCE:  In general he is alert, well-developed, well-  nourished, well-hydrated, and in no acute distress.  HEENT AND NECK:  Head is atraumatic and normocephalic.  Pupils are  PERLA.  EOMI.  The neck is soft, supple, nontender and nondistended.  Oral mucosa is moist.  No scleral icterus is noted.  CHEST AND LUNGS:  Respiratory - he has some end-expiratory wheezing  noted.  No rales or rhonchi.  HEART:  Cardiovascular - he has a regular rate and rhythm with no  murmurs, rubs or  gallops.  ABDOMEN:  The abdomen is obese, soft, nontender and nondistended.  No  rebound or guarding.  EXTREMITIES:  The extremities show there is some trace edema in the  bilateral lower extremities.  No clubbing or cyanosis.  NEUROLOGICAL:  Cranial nerves II-XII are grossly intact.  The patient is alert and  oriented x3.  He has good strength in all extremities.  PSYCHIATRIC:  The patient is slightly anxious on exam.   LABORATORY DATA:  Labs show the CK/MB is less than 1 and troponin is  less than 0.05.  Sodium 136, potassium 3.6, chloride 102, CO2 25,  glucose 122, BUN 17, and creatinine 1.14.  White count 11,000,  hemoglobin 13.7, hematocrit 39.6 and platelets 233,000.  Radiologic  studies; the chest x-ray showed no active disease.   ASSESSMENT:  1. Chest pain of unknown etiology.  2. History of a coronary artery disease.  3. History of generalized anxiety disorder.  4. History of hypercholesterolemia.  5. History of hypertension.  6. History of chronic obstructive pulmonary disease .  7. History of depression.   PLAN:  1. For his chest pain the patient will be IV pain medication as well      as nitroglycerin orally as needed.  So far troponins have been      negative.  2. We will get a repeat EKG in the morning as well as labs.  3. We will get a cardiology consult at that time.  4. For his generalized anxiety disorder we will place the patient on      Xanax three times a day as needed.  At this time the patient will      be kept on his home Prozac.  5. For his coronary artery disease; the patient is on a statin and      this will be continued at this time.  Also we will get a lipid      panel in the A.M.  6. For his hypertension the patient will be placed on his on home      medications with lisinopril and hydrochlorothiazide.  His blood      pressure seems to be stable at this time.  7. The patient will be placed on DVT as well as GI prophylaxis.      Skeet Latch, DO  Electronically  Signed     SM/MEDQ  D:  12/22/2007  T:  12/22/2007  Job:  [id number]

## 2011-01-05 NOTE — H&P (Signed)
[name], LEVENE NO.:  [id number]   MEDICAL RECORD NO.:  [id number]          PATIENT TYPE:  INP   LOCATION:  A208                          FACILITY:  [facility]   PHYSICIAN:  Marcello Moores, MD   DATE OF BIRTH:  Jan 15, 1956   DATE OF ADMISSION:  01/29/2007  DATE OF DISCHARGE:  LH                              HISTORY & PHYSICAL   PRIMARY CARE PHYSICIAN:  Broadus John T. Pickard II, MD.   CHIEF COMPLAINT:  Left-sided chest pain since this morning.   HISTORY OF PRESENT ILLNESS:  [name] is a 55 year old man with a  history of hypertension, dyslipidemia, sliding hiatal hernia with  esophageal spasm, obesity and history of atypical chest pain and  peripheral vascular disease and coronary artery disease status post  cardiac catheterization. He presented today to the emergency room with  chest pain since morning. The chest pain is on the left side and  anterior part of the chest, pressure in nature, intermittent and  including on the left shoulder area.  The patient stated that he had not  any palpitation. He has not any atypical exercise the last few days and  he has not any history of vomiting. Denied any fever or cough. He stated  that he had chest pain before for which he had cardiac catheterization  in [facility] a couple years back and he was told that there is no  severe stenosis at that time, but he was told that he is at risk for  future time. The patient was admitted also with the same complaints in  2007 in our hospital and old EKG as well as cardiac enzymes were normal  and stress test was without significant abnormality except for area of  suspicious ischemia. A barium swallow  was done also at that time which  showed a sliding hiatal hernia with intermittent esophageal spasm. The  patient stated that his chest pain relieved when he received  nitroglycerin and he has not any episode of pain since he arrived in the  emergency room. He denied any associated  shortness of breath with his  chest pain as well.   REVIEW OF SYSTEMS:  A 10-point review of systems is noncontributory.  He  denied any GI or urinary complaints.   ALLERGIES:  No known drug allergies.   SOCIAL HISTORY:  The patient is married and he used to work as a  Corporate investment banker.  He is a chronic smoker and alcohol user.   FAMILY HISTORY:  Significant for a history of MI in his mother as well  as his brother, both of them at the age of less than 1.   PAST MEDICAL HISTORY:  1. Hypertension.  2. History of coronary artery disease status post catheterization.  3. Peripheral vascular disease.  4. Hearing difficulties on the left ear.  5. Obesity.  6. Dyslipidemia.  7. Sliding hiatal hernia with esophageal spasm.   HOME MEDICATIONS:  1. Aspirin 325 mg p.o. daily.  2. Lisinopril 10 mg p.o. daily.  3. Protonix 40 mg p.o. daily.  4. Niaspan 500 mg p.o.  at bedtime.  5. Hydrochlorothiazide 12.5 mg p.o. daily.  6. Ultracet 1-2 tablets p.o. every 4 hours for pain.  7. Spiriva HandiHaler.   PHYSICAL EXAMINATION:  GENERAL:  The patient is lying in the bed without  any respiratory distress.  VITAL SIGNS:  Blood pressure 110/50 and temperature is 97.8 and pulse is  64, respiratory rate 24 and saturation is 98% on 2 liters. HEENT: He has  pink conjunctivae.  Nonicteric sclera.  NECK:  Supple.  CHEST:  Good air entry bilaterally.  No wheezes or rhonchi.  CVS:  S1, S2, regular, well-heard.  No murmur.  ABDOMEN:  Obese, no area of tenderness.  Normoactive bowel sounds.  EXTREMITIES:  He has not any pedal edema.  Peripheral pulses positive.  CNS:  He is alert and well oriented.   LABORATORY DATA:  White blood cells 11.1 and hemoglobin is 13,  hematocrit is 39, platelet count is 270. On the BMP, sodium is 136 and  potassium is 3.4, chloride 104, bicarb 26 and glucose 105. BUN 10,  creatinine is 0.9. CK-MB is 1.1 and troponin is less than 0.05.   EKG is normal sinus rhythm at  67%.  There is not any ST wave changes.  Chest x-ray is pending.   ASSESSMENT:  1. Chest pain.  The patient has a history of hypertension he is a      chronic smoker and alcohol user with a history of heart problem and      history of atypical chest pain before. He needs to be investigated      for possible cardiac origin even though the first cardiac enzymes      as well as the EKG is normal and the patient was put on      nitroglycerin, aspirin and Plavix overnight.  2. Hiatal hernia with esophageal spasm. The patient has barium swallow      in 2007 which showed hiatal hernia with esophageal spasm and this      could be also a cause of his current chest pain. After ruling out      cardiac origin, he might need investigation for his possible      esophageal spasm as well.  Otherwise his hypertension is well-      controlled and he has not any sign of COPD currently. The patient      will have serial cardiac enzymes and EKG and tomorrow he will have      also echocardiogram as well as cardiology consult to rule out      cardiac origin and we will send urine for drug screen as well.      Marcello Moores, MD  Electronically Signed     MT/MEDQ  D:  01/29/2007  T:  01/30/2007  Job:  [id number]

## 2011-01-05 NOTE — Discharge Summary (Signed)
NAMEAERON, DONAGHEY NO.:  [id number]   MEDICAL RECORD NO.:  [id number]          PATIENT TYPE:  INP   LOCATION:  2034                         FACILITY:  [facility]   PHYSICIAN:  Theodore Demark, PA-C   DATE OF BIRTH:  07/03/1956   DATE OF ADMISSION:  02/01/2007  DATE OF DISCHARGE:  02/02/2007                               DISCHARGE SUMMARY   PROCEDURES:  1. Cardiac catheterization.  2. Coronary arteriogram.  3. Left ventriculogram.   PRIMARY DIAGNOSIS:  Chest pain, abnormal left coronary artery from   Dictation ends here.      Theodore Demark, PA-C     RB/MEDQ  D:  02/02/2007  T:  02/02/2007  Job:  [id number]

## 2011-01-05 NOTE — Consult Note (Signed)
NAMECORRADO, HYMON NO.:  [id number]   MEDICAL RECORD NO.:  [id number]          PATIENT TYPE:  INP   LOCATION:  A208                          FACILITY:  [facility]   PHYSICIAN:  Pricilla Riffle, MD, FACCDATE OF BIRTH:  29-Apr-1956   DATE OF CONSULTATION:  01/30/2007  DATE OF DISCHARGE:                                 CONSULTATION   IDENTIFICATION:  Mr. Linn is a 55 year old gentleman who was admitted  yesterday with chest pain.   He reports he was lying in bed watching a race and he developed pain in  his left chest. It was sharp, stabbing, knife-like.  His left arm became  numb.  That lasted all day.  The pain in his chest lasted several  seconds, at times minutes, and occurred intermittently.  Different from  previous paint that he had been admitted for. plus/minus pleuritic.  He  did note some nausea, positive diaphoresis at times.   Prior to yesterday afternoon he had been doing fine.  He notes no reflux  symptoms recently.  Note, nitroglycerin helped the pain but has given  him a headache.   ALLERGIES:  None.   PAST MEDICAL HISTORY:  1. Hypertension.  2. Dyslipidemia.  3. Hiatal hernia with esophageal spasm.  4. Obesity.  5. Decreased hearing, left ear.  6. Question CAD.  Cardiac catheterization in 2001 at [facility].  Question      blockage in one artery.  7. Question PVOD.   Note:  The patient had been admitted for chest pain in August 2007, had  a Myoview scan, adenosine, that showed mild defect in the mid base  region, question ischemia, EF of 54%.  Echocardiogram also in August  2007 2007 showed an LVEF of 60%.   MEDICATIONS ON ADMISSION:  1. IV fluids, 60 mL per hour.  2. Aspirin.  3. Spiriva.  4. Lisinopril 10 mg.  5. Gabapentin 300 mg.  6. Lovastatin 20 mg.  7. Hydrochlorothiazide 12.5 mg.  8. Prozac 40 mg.  9. Plavix initiated.  10.Nitroglycerin.  11.Lovenox.   At home he was also on Niaspan 500 mg.   FAMILY HISTORY:  Significant for CAD  in the mom and brother in their  62s.   SOCIAL HISTORY:  Quit tobacco, though smokes occasional cigarettes.  Occasional EtOH.   REVIEW OF SYSTEMS:  All systems reviewed, negative to the above problem  except as noted above.   PHYSICAL EXAMINATION:  GENERAL:  The patient is currently without pain.  VITAL SIGNS:  Temperature is 97.8, blood pressure 110/54, pulse 50s to  70s, sinus rhythm.  HEENT:  Normocephalic, atraumatic.  EOMI.  PERRL.  Throat clear.  NECK:  JVP is normal.  No bruits.  LUNGS:  Clear to auscultation without rales or wheezes.  CARDIAC:  Regular rate and rhythm, S1, S2.  No S3, S4 or murmurs.  CHEST:  Minimally tender, different from the pain he was experiencing.  Note tattoos on chest and upper arms.  ABDOMEN:  Benign.  No hepatomegaly.  Normal bowel sounds.  EXTREMITIES:  Slight excoriation on shins (after fishing),  2+ pulses.   LABORATORY DATA:  Significant for a hemoglobin of 13, WBC of 10.2,  platelets 225.  Last CK-MB of 223/1.7, troponin 0.02.  BUN and  creatinine of 12 and 1, potassium of 3.8.  Amylase, lipase are normal.   A 12-lead EKG:  Normal sinus bradycardia, 57 beats per minute.  First  degree AV block with PR interval of 210 msec.  Chest x-ray portable done  last in January:  Cardiomegaly with CHF, peribronchial thickening.   IMPRESSION:  The patient is a 55 year old gentleman with a history of  chest pain that is atypical, sharp, stabbing, fleeting.  He does have  risk factors, though, possible coronary artery disease.  Trying to get  records from [place].  EKG is negative.  CK, troponin negative.  Await  echo.  Continue medications for now.  Resume niacin for dyslipidemia.  Continue Protonix for reflux.  Question musculoskeletal.  Patient with  lumbosacral spine disease.      Pricilla Riffle, MD, [facility]  Electronically Signed     PVR/MEDQ  D:  01/30/2007  T:  01/30/2007  Job:  [id number]

## 2011-01-05 NOTE — Discharge Summary (Signed)
NAMEGLOVER, CAPANO NO.:  [id number]   MEDICAL RECORD NO.:  [id number]          PATIENT TYPE:  INP   LOCATION:  A219                          FACILITY:  [facility]   PHYSICIAN:  Osvaldo Shipper, MD     DATE OF BIRTH:  February 21, 1956   DATE OF ADMISSION:  01/29/2007  DATE OF DISCHARGE:  06/11/2008LH                               DISCHARGE SUMMARY   Please review H&P dictated by Dr. Ninetta Lights D. Fanta for details regarding  patient's presenting illness.   Patient was actually transferred to [facility] for  possible cardiac catheterization.   TRANSFER DIAGNOSES:  1. Chest pain, unclear etiology.  2. History of hypertension.  3. History of previous cardiac catheterization with a stent placement.  4. History of peripheral vascular disease.  5. History of dyslipidemia.   BRIEF HOSPITAL COURSE:  Briefly, this is a 55 year old Caucasian male  who presented complaining of left-sided chest pain.  He was admitted to  the hospital and essentially ruled out for acute coronary syndrome.  Because of his history of previous cardiac catheterization, [facility] was consulted and they recommended stress test.  However, the  stress test was not a very good study and no interpretation could be  made out of that which could definitely diagnose the patient's  condition.  Hence, Dr. Dietrich Pates felt that definite way would be to  catheterize him, hence he was transferred to [facility] for cardiac catheterization.  D-dimer was negative.  Otherwise  patient  had some mild hypokalemia which was corrected.  He did have a  chest x-ray which showed mild cardiac enlargement without any acute  changes.   Patient was actually discharged and he went to [facility] before I could see him this morning.      Osvaldo Shipper, MD  Electronically Signed     GK/MEDQ  D:  02/01/2007  T:  02/01/2007  Job:  [id number]   cc:   Gerrit Friends.  Dietrich Pates, MD, [facility]  [address]  [place], [place] [address]

## 2011-01-05 NOTE — Procedures (Signed)
[name], [name] NO.:  [id number]   MEDICAL RECORD NO.:  [id number]          PATIENT TYPE:  INP   LOCATION:  A208                          FAC[facility]:  [facility]   PHYSICIAN:  Pricilla Riffle, MD, FACCDATE OF BIRTH:  08-02-1956   DATE OF PROCEDURE:  01/30/2007  DATE OF DISCHARGE:                                ECHOCARDIOGRAM   REFERRING PHYSICIAN:  Marcello Moores, MD   INDICATIONS:  The patient is a 55 year old with a history of chest pain,  question CAD.   Two-dimensional echocardiogram with echocardiogram Doppler:  The left  ventricle is normal in size with an end-diastolic dimension of 45 mm.  The interventricular septum is thickened at 17 mm moderately.  Posterior  wall mildly thickened at 13 mm.   Left atrium is grossly normal.   Right atrium and right ventricle are normal.   The aortic valve is normal.  There is no insufficiency.   Mitral valve is mildly thickened with no insufficiency.   Pulmonic valve is normal with no insufficiency.   Tricuspid valve is normal with no insufficiency.   Overall LV systolic function is normal with an LVEF of approximately  60%.  RVEF is normal.   No pericardial effusion is seen.      Pricilla Riffle, MD, [facility]  Electronically Signed     PVR/MEDQ  D:  01/30/2007  T:  01/31/2007  Job:  [id number]   cc:   Marcello Moores, MD

## 2011-01-05 NOTE — H&P (Signed)
NAMEHARVE, SPRADLEY NO.:  [id number]   MEDICAL RECORD NO.:  [id number]          PATIENT TYPE:  INP   LOCATION:  NA                           FACILITY:  [facility]   PHYSICIAN:  Raynelle Fanning A. Mayford Knife, M.D.DATE OF BIRTH:  1956/01/15   DATE OF ADMISSION:  DATE OF DISCHARGE:                              HISTORY & PHYSICAL   PRIMARY CARE Jadaya Sommerfield:  Dr. Sylvan Cheese at [facility].   CHIEF COMPLAINT:  Cold, cough, and chest pain.   HISTORY OF PRESENT ILLNESS:  The patient is a 55 year old male with  history of coronary artery disease and COPD who presents with a history  of productive cough since Thursday of last week.  The patient was  evaluated in the emergency department and sent home.  He was seen at  [facility] earlier in the day prior to admission  by his primary care Rockney Grenz and given Xopenex and albuterol.  The  patient received albuterol nebulizer in the office and has an  improvement.  At home, he had Xopenex and albuterol treatment, and had a  very bad paroxysm of coughing.  He has had multiple paroxysms of cough  since Thursday and subsequently developed a chest pain.  He had a  difficulty to catch his breath and became diaphoretic.  The patient  reports fever up to 100.5 to 100.6 at home, as well as chills.  He has  difficulty taking deep breaths, secondary to cough.  Cough productive of  light sputum.  The patient received Rocephin and azithromycin as well as  Solu-Medrol in the emergency department.  The patient reports some  posttussive emesis.   PAST MEDICAL HISTORY:  1. Coronary artery disease.  2. Generalized anxiety disorder.  3. Hypercholesterolemia.  4. History of alcohol abuse.  5. Remote history of tobacco abuse.  6. Hypertension.  7. Depression.  8. COPD.  9. Claudications.  10.Low back pain.   MEDICATIONS:  1. Hydrochlorothiazide 12.5 mg p.o. daily.  2. Lisinopril 10 mg p.o. daily.  3. Lovastatin 20  mg p.o. daily.  4. Albuterol 2 puffs q.4 h. p.r.n.  5. Prozac 40 mg p.o. daily.  6. Symbicort 1 puff inhaled b.i.d.  7. Omeprazole 40 p.o. daily.  8. Xanax 1 mg p.o. t.i.d. p.r.n. anxiety.  9. Sudafed 1 tablet p.o. b.i.d. p.r.n. congestion.  10.Ibuprofen 800 mg p.o. t.i.d. p.r.n. headache and chest wall pain.  11.Xopenex 1.25 mg per 3 mL inhaled q.6 h. with albuterol.   ALLERGIES:  No known drug allergies.   FAMILY HISTORY:  Father with heart disease, mother with diabetes  mellitus and heart disease, and sister with gallbladder disease.   SOCIAL HISTORY:  The patient is married.  He has history of remote  working in [employer] until 2007.  Remote history of alcohol abuse as  well as tobacco abuse, but currently quit from both.   REVIEW OF SYSTEMS:  Positive for chills, fatigue, fever, loss of  appetite, sweats, difficulty breathing with lying down, peripheral  edema, chest discomfort, cough, productive sputum, and shortness of  breath.  PHYSICAL EXAMINATION:  VITAL SIGNS:  Temperature 98.9, heart rate 99,  respiratory rate 12, blood pressure 116/50, and oxygen saturation 98% on  room air.  GENERAL:  Alert, very anxious-appearing male, with rapid speech and mild  distress.  HEENT:  Extraocular movements intact.  Pupils equal, round, and reactive  to light and accommodation.  Vision grossly normal.  No conjunctival  inflammation, scleral injection, or icterus.  No erythema or exudate in  the oropharynx.  NECK:  No cervical lymphadenopathy.  RESPIRATORY:  Prolonged expirations.  Occasional scattered wheezes.  Tachypneic without accessory muscle use.  No rales or rhonchi.  CARDIOVASCULAR:  Regular rate and rhythm without murmurs, rubs, or  gallops.  ABDOMEN:  Obese, nontender, nondistended with positive bowel sounds.  No  rebound or guarding.  MUSCULOSKELETAL:  Normal range of motion of all extremities without  joint tenderness, erythema, or edema.  EXTREMITIES:  Trace  peripheral edema, right greater than left.  No  clubbing or cyanosis.  A 2+ distal pulses.  NEUROLOGICAL:  Cranial nerves II through XII grossly are intact.  Strength is 5/5 in all extremities with intact sensation.  SKIN:  Intact without plaques, lesions, or rashes.  PSYCHIATRIC:  The patient is moderately to severely anxious.   LABORATORY DATA:  White blood cell count 14.3, hemoglobin 12.5,  hematocrit 34.6, and absolute neutrophil count 9.5.  Sodium 140,  potassium 3.2, chloride 104, glucose 125, BUN 16, and creatinine 1.3.  BNP 32.  Point of care cardiac markers negative x1.  EKG with sinus  rhythm and chest x-ray appear to have air spaces opacity representing  multifocal pneumonia versus pulmonary edema.   ASSESSMENT:  The patient is a 55 year old male with a history of chronic  obstructive pulmonary disease and coronary artery disease who presents  with cough and chest pain.   PLAN:  1. Cough:  Likely secondary to pneumonia versus COPD exacerbation.  We      will continue antibiotics.  We will change to Avelox 400 mg p.o.      daily for COPD exacerbation.  We will start prednisone 40 mg p.o.      daily x 7 days.  We will give albuterol and Atrovent nebs q.42 h.      p.r.n.  We will give Tussionex 5 mL p.o. b.i.d. and Tessalon Perles      200 mg p.o. t.i.d., and ibuprofen 800 mg p.o. q.8 h. p.r.n. fever      and pain.  2. Chest pain:  The patient was diagnosed with costochondritis at the      family practice center appointment that he had earlier today.  His      symptoms are most consistent with costochondritis versus pleurisy      related to cough.  We will give the patient ibuprofen 800 mg p.o.      q.8 h. p.r.n.  No EKG changes and more negative strep workup and      cardiac markers.  We did not believe that his pain is secondary to      acute coronary syndrome; therefore, we will not cycle cardiac      enzymes further.  3. Anxiety disorder/depression:  The patient's cough  has been visibly      exacerbated by his anxiety.  We will give Ativan 1 mg IV in the      emergency department and Xanax 1 mg p.o. b.i.d. p.r.n.  We will      also continue the patient's Prozac at  home dose.  4. Coronary artery disease:  We will continue the patient's      lovastatin.  It is currently not documented if he is taking      aspirin; therefore, we will add this as well.  5. Hypercholesterolemia:  We will continue lovastatin.  6. Hypertension:  The patient's blood pressure appears well      controlled.  We will continue lisinopril and hydrochlorothiazide.  7. Gastroesophageal reflux disease:  We will continue Omeprazole 40 mg      p.o. daily.  8. Feeding and nutrition/gastrointestinal:  We will place the patient      on heart-healthy diet and saline lock the  intravenous fluids.  9. Prophylaxis:  Ambulation and Protonix.  10.Disposition:  Pending clinical improvement, and no O2 requirement.      Lauro Franklin, MD  Electronically Signed      Zenaida Deed. Mayford Knife, M.D.  Electronically Signed    TCB/MEDQ  D:  12/15/2007  T:  12/16/2007  Job:  [id number]

## 2011-01-05 NOTE — Op Note (Signed)
[name], [name] NO.:  [id number]   MEDICAL RECORD NO.:  [id number]          PATIENT TYPE:  AMB   LOCATION:  DAY                           FACILITY:  [facility]   PHYSICIAN:  Dalia Heading, M.D.  DATE OF BIRTH:  10/06/1955   DATE OF PROCEDURE:  07/10/2007  DATE OF DISCHARGE:                               OPERATIVE REPORT   PREOPERATIVE DIAGNOSES:  Cholecystitis, cholelithiasis.   POSTOPERATIVE DIAGNOSES:  Cholecystitis, cholelithiasis.   PROCEDURE:  Laparoscopic cholecystectomy.   SURGEON:  Dalia Heading, M.D.   ANESTHESIA:  General endotracheal.   INDICATIONS:  The patient is a 55 year old white male who presents with  biliary colic secondary to cholelithiasis.  The risks and benefits of  the procedure including bleeding, infection, hepatobiliary injury and  the possibility of an open procedure were fully explained to the  patient; he gave informed consent.   PROCEDURE NOTE:  The patient was placed in the supine position.  After  the induction of general endotracheal anesthesia, the abdomen was  prepped and draped using the usual sterile technique with Betadine.  Surgical site confirmation was performed.   A supraumbilical incision was made down to the fascia.  A Veress needle  was introduced into the abdominal cavity, and confirmation of placement  was done using the saline drop test.  The abdomen was then insufflated  to 16 mmHg pressure.  An 11-mm trocar was introduced into the abdominal  cavity under direct visualization without difficulty.  The patient was  placed in reverse Trendelenburg position, and an additional 11-mm trocar  was placed in the epigastric region; and 5-mm trocars were placed in the  right upper quadrant and right flank regions.  The liver was inspected  and noted to be within normal limits.  The gallbladder was retracted  superiorly and laterally.  The dissection was begun around the  infundibulum of the gallbladder.  The  cystic duct was first identified.  The juncture to the infundibulum fully identified.  Endoclips were  placed proximally and distally on the cystic duct, and the cystic duct  was divided.  This was likewise done to the cystic artery.  The  gallbladder was then freed away from the gallbladder fossa using Bovie  electrocautery.  The gallbladder was delivered through the epigastric  trocar site using an EndoCatch bag.  The gallbladder fossa was  inspected; no abnormal bleeding or bile leakage was noted.  Surgicel was  placed in the gallbladder fossa.  All fluid and air were then evacuated  from the abdominal cavity prior to removal of the trocars.   All wounds were irrigated with normal saline.  All wounds were injected  with 0.5 cm Sensorcaine.  The supraumbilical fascia was reapproximated  using an 0 Vicryl interrupted suture.  All skin incisions were closed  using staples.  Betadine ointment and dry sterile dressings were  applied.   All tape and needle counts were correct at the end of the procedure.  The patient was extubated in the operating room and went back to  recovery room, awake and in stable  condition.   COMPLICATIONS:  None.   SPECIMEN:  Gallbladder.   BLOOD LOSS:  Minimal.      Dalia Heading, M.D.  Electronically Signed     MAJ/MEDQ  D:  07/10/2007  T:  07/10/2007  Job:  [id number]   cc:   Kirstie Peri, MD  Fax: [phone]

## 2011-01-05 NOTE — Discharge Summary (Signed)
NAMEISAAK, DELMUNDO NO.:  [id number]   MEDICAL RECORD NO.:  [id number]          PATIENT TYPE:  INP   LOCATION:  2034                         FACILITY:  [facility]   PHYSICIAN:  Everardo Beals. Juanda Chance, MD, FACCDATE OF BIRTH:  1956-06-23   DATE OF ADMISSION:  02/01/2007  DATE OF DISCHARGE:  02/02/2007                               DISCHARGE SUMMARY   PROCEDURES:  1. Cardiac catheterization.  2. Coronary arteriogram.  3. Left ventriculogram.   PRIMARY DIAGNOSES:  1. Chest pain.  No significant coronary artery disease at      catheterization.  2. Hypertension  3. History of peripheral vascular disease.  4. Dyslipidemia, with triglycerides of 232, HDL 27 in July 2007.  5. Obesity.  6. Gastroesophageal reflux disease.  7. Family history of coronary artery disease.  8. Remote history of tobacco use.   TIME OF DISCHARGE:  37 minutes.   HOSPITAL COURSE:  Mr. Kirby is a 55 year old male with no previous  history of coronary artery disease.  He went to [facility] on  January 29, 2007 for chest pain.  He ruled out for MI and had an  echocardiogram while at [facility] that showed an EF of 60%, with no  significant valvular abnormalities.  A stress test was performed which  was nondiagnostic.  Because of recurrent chest pain, he was transferred  to [facility] for cardiac catheterization.   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  He had no coronary artery disease  and, although there was a report of a previous stent at [facility],  none was seen on the catheterization.  Dr. Juanda Chance evaluated the patient  and said that he needed to tell whether the left coronary artery  traverses between the pulmonary artery and aorta or behind the aorta.  After further review, Dr. Juanda Chance felt that the left coronary artery  traversed behind the aorta.  Postprocedure, he was without chest pain.  His symptoms  were felt to be secondary to reflux most likely, and there  were some anxiety issues as well.  Mr. Hyslop was given a short-term  prescription for Xanax, with the caveat that we would not refill it.  He  was advised to follow up with Dr. Tanya Nones if he needed any further  antianxiety medications.  He is to follow up with Dr. Tenny Craw.  He still  complains of some significant dyspnea on exertion, and he was advised to  follow up with Dr. Tanya Nones for that as well.  He was considered stable  for discharge on February 02, 2007, with outpatient followup arranged.   DISCHARGE INSTRUCTIONS:  1. His activity level is to be increased gradually.  2. He is not to drive for 2 days.  3. He is to stick to a low-sodium, heart-healthy diet.  4. He is to call our office for any problems with the catheterization      site.  5. He is to follow up with Dr. Tanya Nones.  6. He is to follow up with Dr. Tenny Craw  on February 20, 2007 at 1:45.   DISCHARGE MEDICATIONS:  1. Aspirin 325 mg daily.  2. Lisinopril 10 mg daily  3. Nexium or Protonix daily.  4. Niaspan 500 mg at bedtime.  5. HCTZ 12.5 mg daily.  6. Ultracet p.r.n.  7. Lovastatin 20 mg daily.  8. Prozac 40 mg daily  9. Xanax 0.5 mg, 1/2 to 1 tablet p.r.n., 30 given, no refills.      Theodore Demark, PA-C      Bruce R. Juanda Chance, MD, [facility]  Electronically Signed    RB/MEDQ  D:  02/02/2007  T:  02/03/2007  Job:  [id number]   cc:   Carollee Herter. Tanya Nones, M.D.

## 2011-01-05 NOTE — Discharge Summary (Signed)
NAMEJAYDENCE, [name] NO.:  [id number]   MEDICAL RECORD NO.:  [id number]          PATIENT TYPE:  INP   LOCATION:  A217                          FACILITY[facility]  [facility]   PHYSICIAN:  [name] Singh, DO    DATE OF BIRTH:  [birth year]   DATE OF ADMISSION:  12/21/2007  DATE OF DISCHARGE:  05/01/2009LH                               DISCHARGE SUMMARY   ADMISSION DIAGNOSES:  1. Chest pain.  2. Coronary artery disease.  3. Generalized anxiety disorder.  4. Hypercholesterolemia.  5. Hypertension.  6. Chronic obstructive pulmonary disease.  7. Depression.   DISCHARGE DIAGNOSES:  1. Chest pain, noncardiac.  2. Chronic obstructive pulmonary disease.  3. Generalized anxiety disorder.  4. Coronary artery disease.  5. Hypercholesterolemia.  6. Hypertension.  7. Depression.   PRIMARY CARE PHYSICIAN:  [facility].   DIAGNOSTICS:  Include a portable chest on December 21, 2007 which showed no  active disease.   CONSULTATIONS:  [facility].   HISTORY/PHYSICAL:  His H&P was done by Dr. Skeet Latch, please  refer.  To summarize, the patient was admitted to the service of  [facility] for chest pain after he was just recently discharged a week  ago from [facility] on December 15, 2007 for COPD.  Also he was  admitted, an EKG was done and [facility] was consulted.  The  patient had DVT and GI prophylaxis.  He was seen by cardiology today who  reviewed his labs and his tests that were done.  It was concluded that  if he had normal cardiac enzymes this afternoon that he could be  discharged.  There was concern there was low risk for secondary cardiac  ischemia.  The patient continued to do well.  At this point in time, it  was determined he was stable enough for discharge.   DISCHARGE MEDICATIONS:  1. Aspirin 325 mg daily.  2. Lisinopril 10 mg daily.  3. Lovastatin 20 mg p.o. daily.  4. HCTZ 12.5 mg daily.  5. Prozac 40 mg daily.  6. Xanax, no  dose given.   There was also some question whether or not this could also be due to  the patient's COPD and he is __________ with inhalers.  I will go ahead  and start him on Advair discus 150/50 use b.i.d. as directed.   CONDITION ON DISCHARGE:  Stable.   DISPOSITION:  Home.   FOLLOW UP:  1. He is to follow up with his primary care doctor in 3-5 days.  2. Follow up with [facility] in 2 weeks or if he has any other      symptoms.      [name] Singh, DO  Electronically Signed     CB/MEDQ  D:  12/22/2007  T:  12/22/2007  Job:  [id number]

## 2011-01-05 NOTE — Discharge Summary (Signed)
NAMEVICTORIANO, CAMPION NO.:  [id number]   MEDICAL RECORD NO.:  [id number]          PATIENT TYPE:  INP   LOCATION:  3030                         FACILITY:  [facility]   PHYSICIAN:  Nestor Ramp, MD        DATE OF BIRTH:  05/10/1956   DATE OF ADMISSION:  12/12/2007  DATE OF DISCHARGE:  12/13/2007                               DISCHARGE SUMMARY   PRIMARY CARE PROVIDERS:  1. Sylvan Cheese, MD  2. Cristi Loron, MD   REASON FOR ADMISSION:  Cough and chest pain.   DISCHARGE DIAGNOSES:  1. Chronic obstructive pulmonary disease exacerbation.  2. Generalized anxiety disorder.  3. Hypercholesteremia.  4. Hypertension.  5. Depression.  6. Costochondritis.  7. History of claudication.  8. History of chronic low back pain.   DISCHARGE MEDICATIONS:  1. Xanax 1 mg p.o. q.8 h. p.r.n. anxiety.  2. Aspirin 81 mg p.o. daily.  3. Prozac 40 mg p.o. daily.  4. HCTZ 12.5 mg p.o. daily.  5. Lisinopril 10 mg p.o. daily.  6. Lovastatin 20 mg p.o. daily.  7. Doxycycline 100 mg p.o. b.i.d. x6 days.  8. Prednisone 40 mg p.o. daily x6 days.  9. Lortab 10/500 1 tab p.o. q.4 h. p.r.n. pain, #12, no refills.   The patient's neurosurgeon's office was contacted and informed that the  patient has received his pain medication prescription.  Since the  patient has narcotic pain contract with Dr. Lovell Sheehan' office.  Dr.  Lovell Sheehan' office was called prior to the patient is being prescribed this  medication and the patient was told of possible verifications with  regards to his current pain medicine contract.   HOSPITAL COURSE:  1. Cough:  Upon admission, the patient continued to have significant      paroxysms of cough which were exacerbated by deep inspirations as      well as his anxiety.  The patient's chest x-ray showed the      findingsconcerning for possible pneumonia; however, given his      overall clinical picture, determined  that he  most likely had      exacerbation of his chronic  obstructive pulmonary disease.  He was      admitted, did not require oxygen, and was started on antibiotics as      well as oral steroids and nebulized-breathing treatment.  For his      cough, he was also given Tessalon Perles scheduled, Tussionex      scheduled, and by the morning following the discharge, seemed to      have significant improvement with his cough.  The patient is to      complete the remainder of his antibiotics course as an outpatient.      Prior to discharge, the patient's oxygen saturations remained      stable and greater than 92% on room air.  2. Chest pain:  The patient's pain was most likely secondary to strong      and frequent coughing paroxysms.  He reports ibuprofen did not      significantly change  the pain; and therefore while in the hospital,      he received OxyIR 5-10 mg p.o. q.4 h. as needed.  The patient      received significant amount of pain medication from his      neurosurgeon on a bimonthly basis according to the patient; and      therefore on discharge, he was provided with 2 days' worth of      Lortab for his continued chest pain as well as his back pain with      his neurosurgeon's office to be notified and prescribed above.  3. Back pain:  The patient with a history of low back pain, and he was      scheduled for surgery on Monday, December 18, 2007.  He received      Lortab from his neurosurgeon's office and congestion with a      narcotic pain medication contract.  At the time of discharge, the      patient was requesting 100 tablets of Lortab, stating that he had      recently received 50 tablets on November 28, 2007, and due to increased      activity, had been improperly using them and using up to 8 tablets      a day and subsequently had ran out.  The patient's neurosurgeon's      office was called to collaborate his informations, and they were      informed that patient will be given 12 tablets.  At the time of      discharge, Lortab 10/500  mg since he states that his medication      refilled every 17 days.  Give that the patient was hospitalized      previously for a mild COPD exacerbation, we feel that he will be      stable for back surgery to be determined by Dr. Lovell Sheehan.  4. Hypertension:  The patient's blood pressure was stable throughout      the hospital stay.  He was discontinued his home medication      regimen.  5. Anxiety/Depression:  The patient appears to have very significant      anxiety and depression and question of improper use of pain      medications as well as his Xanax.  The patient states that he was      taking Xanax at home every 3 hours.  Would recommend possibly      changing patient to an  alternative antidepressant with an      indication for generalized anxiety disorder as an outpatient.   CONDITION ON DISCHARGE:  Improved.   DISCHARGE LABS:  Sodium 137, potassium 4.5, BUN 11, creatinine 1.02,  hemoglobin 13, hematocrit 38, platelets 323, and white blood cell count  21.   DISPOSITION:  The patient is being discharged home.   FOLLOWUP:  1. The patient is to follow up with Dr. Melvyn Novas at [facility] on December 20, 2007, at 4 p.m.  2. The patient is to follow up with Dr. Lovell Sheehan in Neurosurgery.  Dr.      Lovell Sheehan' office will call the patient with appointment.   FOLLOWUP ISSUES:  1. In the last appointment, the patient was given albuterol and      Xopenex for COPD.  We will recommend adjustment of this medication      regimen.  2. The patient's behavior at the time of discharge returning for  misuse of his narcotic pain medications.  3. The patient did appear to have a significant anxiety on admission      and could especially benefit from a different antidepressant which      also is a indication for his generalized anxiety disorder.  This      also may assist in decreasing the possibility of misuse of Xanax.      Lauro Franklin, MD  Electronically  Signed      Nestor Ramp, MD  Electronically Signed    TCB/MEDQ  D:  12/15/2007  T:  12/16/2007  Job:  [id number]

## 2011-01-05 NOTE — Consult Note (Signed)
NAMEDERRICO, ZHONG NO.:  [id number]   MEDICAL RECORD NO.:  [id number]          PATIENT TYPE:  INP   LOCATION:  A217                          FACILITY:  [facility]   PHYSICIAN:  Pricilla Riffle, MD, FACCDATE OF BIRTH:  1956/04/05   DATE OF CONSULTATION:  12/22/2007  DATE OF DISCHARGE:  12/22/2007                                 CONSULTATION   IDENTIFICATION:  Asked to see regarding chest pain.  The patient is a 55-  year-old gentleman who was just discharged from [facility] Wednesday  of this week.  He was admitted on December 15, 2007, with cough productive  of sputum and temperature 106.  He was treated for pneumonia with  antibiotics, prednisone, and nebulizers.  His pain at that time was felt  to be secondary to cough, possibly pleurisy.   Yesterday, he was at home sitting when he developed chest pressure like  a heaviness across his chest, 8/10 in intensity.  Lasted for a couple of  hours, came to the emergency room.  There was no help with nitroglycerin  either p.o. or IV.  Only with a pain pill and with oxygen did the pain  go away.  It was not pleuritic.  He noted no wheezing at home.  He does  have a history of acid reflux, which he says he takes medicines at home,  though they are not on his list of home medicines.  Now, he is feeling  great.   ALLERGIES:  None.   MEDICATIONS AT HOME:  By records show aspirin, lisinopril 10, lovastatin  20, hydrochlorothiazide 12.5, Prozac 40, and Xanax p.r.n.   PAST MEDICAL HISTORY:  1. Chest pain.  The patient had a cardiac catheterization in June      2008, showed no coronary artery disease.  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  The RCA was      dominant and a large vessel.  2. Dyslipidemia.  3. EtOH.  4. Tobacco use, remote, 35-pack-year.  5. Hypertension.  6. COPD.  7. Had a history of dust exposure.  8.  Depression.  9. Claudication.  10.Anxiety.   SOCIAL HISTORY:  The patient again remote tobacco, remote EtOH.  Married.   FAMILY HISTORY:  Significant for a mother with CAD at 70.  Dad died in  2006/01/16 of CAD.  Otherwise, diabetes.   REVIEW OF SYSTEMS:  The patient snores, scheduled for a sleep study,  also scheduled to have back surgery by Dr. Lovell Sheehan on 12/28/2007.  Otherwise, negative to the above problem except as noted.   PHYSICAL EXAMINATION:  GENERAL:  The patient is in no acute distress.  Actually, he says he feels great.  VITAL SIGNS:  Temperature is 98, blood pressure is 112-122/60-70, pulse  75 and regular (telemetry sinus rhythm), and O2 sat on room air 97%.  HEENT:  Normocephalic and atraumatic.  EOMI.  PERRL.  Mucous membranes  are moist.  NECK:  No bruits.  No JVD.  LUNGS:  Minimal wheeze and mild decreased air flow.  No rales.  CARDIAC:  Regular rate and rhythm, S1 and S2.  No S3.  No murmurs.  ABDOMEN:  Benign.  Normal bowel sounds.  No masses.  EXTREMITIES:  Distal pulses 2+.  No lower extremity edema.   LABORATORY DATA:  Significant for hemoglobin of 12.7, WBC of 10.4,  platelets of 195.  BUN and creatinine of 15 and 1.  Potassium of 3.7,  point of care troponin negative x2.  Chest x-ray, no acute disease.  A  12-lead EKG, normal sinus rhythm at 75 beats per minute.   IMPRESSION:  A 55 year old who does not have CAD.  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  He was just  discharged from the hospital 2 days ago after treatment for pneumonia.  Yesterday, he developed chest pressure at rest, helped/relieved with  oxygen and OxyContin.  Exam minimal wheeze with forced expiration and  mild decreased air flow.   IMPRESSION:  1. Chest pain.  I do not think it is secondary to cardiac ischemia,      most likely pulmonary with some reactive airways disease  given      recent upper respiratory tract infection.  Note, the patient has      not been on any inhalers or steroids at discharge, question if      needed.   Also, history of reflux, not on meds.   One more troponin.  If negative, okay to discontinue.   Overall, I feel the patient is at low risk for major cardiac event with  upcoming back surgery, but would stabilize pulmonary first.   1. Dyslipidemia.  Followup as outpatient with primary.  Continue      lovastatin.      Pricilla Riffle, MD, [facility]  Electronically Signed     PVR/MEDQ  D:  12/22/2007  T:  12/23/2007  Job:  [id number]

## 2011-01-05 NOTE — Op Note (Signed)
[name], [name] NO.:  [id number]   MEDICAL RECORD NO.:  [id number]          PATIENT TYPE:  INP   LOCATION:  3041                         FACILITY:  [facility]   PHYSICIAN:  Cristi Loron, M.D.DATE OF BIRTH:  10-Nov-1955   DATE OF PROCEDURE:  DATE OF DISCHARGE:  12/22/2007                               OPERATIVE REPORT   BRIEF HISTORY:  The patient is a 55 year old white male who has suffered  from back and leg pain consistent with neurogenic claudication.  He has  failed medical management, was worked up with a lumbar MRI, which  demonstrated that the patient had a multifactorial spinal stenosis at L4-  5, L5-1 with epidural lipomatosis.  I discussed various treatment  options with the patient including surgery.  He is aware of the risks,  benefits, and alternatives of surgery and decided to proceed with a  lumbar decompression.   PREOPERATIVE DIAGNOSES:  L4-L5 and L5-S1 spinal stenosis, facet  arthropathy, epidural lipomatosis, lumbago, lumbar radiculopathy.   POSTOPERATIVE DIAGNOSES:  L4-L5 and L5-S1 spinal stenosis, facet  arthropathy, epidural lipomatosis, lumbago, lumbar radiculopathy.   PROCEDURE:  L4 and L5 laminotomy and foraminotomies to decompress the  thecal sac and the bilateral L4-L5 and S1 nerve roots using  microdissection and to remove the interspinal lesion, i.e. the epidural  lipomatosis.   SURGEON:  Cristi Loron, M.D.   ASSISTANT:  Danae Orleans. Venetia Maxon, M.D.   ANESTHESIA:  General endotracheal.   ESTIMATED BLOOD LOSS:  200 mL.   SPECIMENS:  None.   DRAINS:  None.   COMPLICATIONS:  None.   DESCRIPTION OF PROCEDURE:  The patient was brought to the operating room  by anesthesia team.  General endotracheal anesthesia was induced.  The  patient was then turned to the prone position on the Wilson frame.  His  lumbosacral region was then prepared with Betadine scrub and Betadine  solution.  Sterile drapes were applied and then  injected and excised  with Marcaine with epinephrine solution.  I used the scalpel to make a  linear midline incision over the L4-5 and L5-S1 interspaces.  I used  electrocautery to perform a bilateral subperiosteal dissection, exposing  the spinous process of lamina of L3, L4, and L5 in the upper sacrum.  We  obtained intraoperative radiograph to confirm our location and then  inserted the [facility] retractor for exposure.  We began the  decompression by incising interspinous ligament at L3-L4, L4-L5, and L5-  S1 with the scalpel.  We used the [employer] to remove the spinous  process of L4 and L5 and a caudal edge of the L3 lamina.  We then used a  high-speed drill to perform bilateral L4 and L5 laminotomies.  Then, I  used the Kerrison punch to complete the laminectomy at L4 and L5 as well  as removed the ligamentum of flavum at L3-4, L4-L5, and L5-S1.  We then  brought the operative microscope into the field and using magnification  and elimination, we completed the microdissection/decompression.  We  then performed foraminotomies about the bilateral L4-5 and S1 nerve  roots.  Of note, the patient had significant facet and ligamentum flavum  hypertrophy and a lateral recess stenosis.  This combined with his  epidural lipomatosis causing considerable spinal stenosis.  We also used  microdissection to free up some of the epidural lipomatosis and removed  using the pituitary forceps further decompressing the thecal sac.  We  then inspected the intervertebral disk at L3-L4, L4-L5, and L5-S1 noted  there was bulging, but there was no disk herniations.  At this point, we  had good decompression.  We obtained hemostasis using bipolar cautery.  We irrigated the wound out bacitracin solution.  We then removed the  retractor and then reapproximated the patient's thoracolumbar fascia  with interrupted #1 Vicryl suture, the subcutaneous tissue with  interrupted with 2-0 Vicryl suture, and  the skin with Steri-Strips and  Benzoin.  The wound was then closed with bacitracin ointment and sterile  dressing is applied.  The drapes were removed, and the patient was  subsequently returned to supine position where he was extubated by the  anesthesia team and transported to the post anesthesia care unit in  stable condition.  All sponge, instrument, and needle counts were  correct during this case.      Cristi Loron, M.D.  Electronically Signed     JDJ/MEDQ  D:  12/28/2007  T:  12/29/2007  Job:  [id number]

## 2011-01-05 NOTE — H&P (Signed)
NAMEQUINNTON, BURY NO.:  [id number]   MEDICAL RECORD NO.:  [id number]          PATIENT TYPE:  AMB   LOCATION:  DAY                           FACILITY:  [facility]   PHYSICIAN:  Dalia Heading, M.D.  DATE OF BIRTH:  Jul 24, 1956   DATE OF ADMISSION:  DATE OF DISCHARGE:  LH                              HISTORY & PHYSICAL   CHIEF COMPLAINT:  Cholecystitis, cholelithiasis.   HISTORY OF PRESENT ILLNESS:  The patient is a 55 year old, white male  who was referred for evaluation and treatment of biliary colic secondary  to cholelithiasis. He has been having right upper quadrant abdominal  pain, nausea and bloating for many weeks. He does have fatty food  intolerance. No fever, chills or jaundice have been noted.   PAST MEDICAL HISTORY:  Hypertension, depression.   PAST SURGICAL HISTORY:  Unremarkable.   CURRENT MEDICATIONS:  Lisinopril, hydrochlorothiazide, Prozac.   ALLERGIES:  No known drug allergies.   REVIEW OF SYSTEMS:  He drinks alcohol daily. He does smoke tobacco  occasionally. He denies any chest pain, MI, CVA, or diabetes mellitus.   PHYSICAL EXAMINATION:  The patient is a well-developed, well-nourished,  white male in no acute distress.  HEENT:  Reveals no scleral icterus.  LUNGS:  Clear to auscultation with equal breath sounds bilaterally.  HEART:  Reveals a regular rate and rhythm without S3, S4 or murmurs.  ABDOMEN:  Soft and nondistended. He is tender in the right upper  quadrant to palpation. No hepatosplenomegaly, masses or hernias  identified. CT scan of the abdomen done at [facility]  reveals cholelithiasis.   IMPRESSION:  Cholecystitis, cholelithiasis.   PLAN:  The patient is scheduled for laparoscopic cholecystectomy on  July 10, 2007. The risks and benefits of the procedure including  bleeding, infection, hepatobiliary injury, and the possibility of an  open procedure were fully explained to the patient, who gave  informed  consent.      Dalia Heading, M.D.  Electronically Signed     MAJ/MEDQ  D:  07/06/2007  T:  07/07/2007  Job:  [id number]   cc:   Kirstie Peri, MD  Fax: [phone]

## 2011-01-08 NOTE — Discharge Summary (Signed)
[name], [name] NO.:  [id number]   MEDICAL RECORD NO.:  [id number]          PATIENT TYPE:  INP   LOCATION:  2010                         FACILITY:  [facility]   PHYSICIAN:  [name], M.D. LHCDATE OF BIRTH:  1955-12-27   DATE OF ADMISSION:  10/19/2005  DATE OF DISCHARGE:  10/20/2005                           DISCHARGE SUMMARY - REFERRING   DISCHARGE DIAGNOSES:  1.  Chest discomfort of uncertain etiology, patient refusing further      evaluation.  2.  Recent upper respiratory infection without elevated WBC count, but      running a slight temperature.  History as noted below.  No procedures      performed.   SUMMARY OF HISTORY:  [name] is a 55 year old white male who was  transferred [facility] for evaluation of chest  discomfort.  He describes a three-day history of substernal chest discomfort  as well as a temperature up to 104 with productive cough.  His discomfort  has been lasting all day and is described as a heaviness and is worse with  cough and deep inspiration and certain movements.  He does have associated  shortness of breath, nausea, vomiting, and diaphoresis.   PAST MEDICAL HISTORY:  1.  Catheterization at [facility] after an abnormal stress test.  He was that      there was blockages and that only two arteries supplied blood to his      heart.  However, he does not know any specifics.  He denies PCI and      records are not available.  2.  Remote tobacco and substance abuse with continued alcohol use.  3.  He has a history of hypertension, but denies history of diabetes or      hyperlipidemia.   LABORATORY DATA:  Chest x-ray shows prominent bibasilar interstitial  markings of uncertain chronicity.  EKGs show normal sinus rhythm, slightly  delayed R-wave, normal axis, nonspecific ST-T wave changes.  No old EKG is  available for comparison.  CK-MBs and troponins were negative x2.  Admission  H&H was 14.3 and 41.5, normal  indices, platelets 214, WBC 5.1.  D-dimer was  0.36.  Sodium 135, potassium 3.3, BUN 12, creatinine 1.1, glucose 133.  BNP  was less than 30.  Overnight he did not have any further chest discomfort;  however, continued to complain of a cough and demand medications for this.  Dr. Jens Som felt that he should have an inpatient stress Myoview to further  evaluate his discomfort.  However, on the morning of the 28th patient was  initially agreeable but late on the morning of the 28th he refused to have  any further testing and demanded to go home.  After discussion with Dr.  Dietrich Pates instead of having the patient sign out AMA patient was discharged  home.   DISPOSITION:  Mr. Lips is asked to maintain a low salt, fat, cholesterol  diet.  His activities are not restricted.  He is given permission to return  to work on October 21, 2005.  Dr. Dietrich Pates has given him  prescriptions for  dextromethorphan with codeine q.4-6h. p.r.n. for his cough and pravastatin  40 mg p.o. q.h.s.  He is asked to continue aspirin 81 mg p.o. daily.  He was  asked to call Dr. Eliberto Ivory to become established with a primary care physician  and arrange a follow-up appointment in approximately one week.  He has an  appointment to see Dr. Andee Lineman and Arnette Felts in the [place] office on November 18, 2005 at 12:30 to become established  with a cardiologist.  At the time of follow-up with Dr. Andee Lineman a waiver  should be signed obtaining the records from [facility].  He did receive a note at  the time of discharge that he had been in the hospital from February 27 to  October 20, 2005.      [name], P.A. [facility]    ______________________________  [name], M.D. [facility]    EW/MEDQ  D:  10/20/2005  T:  10/20/2005  Job:  [id number]   cc:   Eliberto Ivory, M.D.  [address]  [place], [place] [address]

## 2011-01-08 NOTE — H&P (Signed)
[name], [name] NO.:  [id number]   MEDICAL RECORD NO.:  [id number]          PATIENT TYPE:  INP   LOCATION:  1849                         FACILITY:  [facility]   PHYSICIAN:  Broadus John T. Pamalee Leyden, MDDATE OF BIRTH:  Feb 21, 1956   DATE OF ADMISSION:  03/28/2006  DATE OF DISCHARGE:                                HISTORY & PHYSICAL   DICTATOR:  Corbin Ade, MS4, dictating for Priscille Heidelberg. Pickard II, MD.   CHIEF COMPLAINT:  Chest pain, shortness of breath, dyspnea on exertion, and  lower extremity claudication as well as some numbness and tingling in the  lower and upper extremities.   HISTORY OF PRESENT ILLNESS:  The patient is a 55 year old white male with a  past medical history significant for two coronary arteries, per the patient,  and history of hypertension as well as peripheral vascular disease, who  presents with atypical chest pain at both rest and exertion.  This has been  going on for the last four weeks.  The pain has been coming every other day  up until the last several days in which his pain has come every day.  The  patient describes the pain as someone sitting on his chest that lasts  minutes and is associated with shortness of breath.  There is also some  radiation to the left shoulder.  Again, this occurs both at rest and  exertion.  The pain is not relieved with rest or with nitro.  He has also  been having some dyspnea on exertion but also has shortness of breath at  rest as well.  He also has some symptoms consistent with lower extremity  claudication with walking as he has aching in his feet and calves.  The  patient was actually admitted on July 27th but left July 28th AMA to go to a  different hospital, but then he has had increasing chest pain over the last  three to four days and decided to return to the hospital today.  He denies  any pleuritic pain or any chest pains related to food intake.  The patient  states he does have some different  type of chest pain with palpation and  movement.  At present, the patient is not having any shortness of breath or  significantly bad chest pain.  In the emergency department, the patient  received 4 mg of Zofran and 10 mg of Dilaudid.   PAST MEDICAL HISTORY:  1.  History of hypertension, untreated.  2.  History of two coronary arteries, per his report, via a catheterization      at [facility] approximately two to three years ago.  3.  Peripheral vascular disease and possible stent placement five years ago.  4.  Left ear hearing loss.  5.  Tobacco use with less than half a pack a day.   SOCIAL HISTORY:  The patient is married.  He works in [employer] but has  not been working lately due to his chest pain and shortness of breath as  well as numbness and tingling.  Again, he smokes less than  half a pack a day  of cigarettes.  He admits to drinking six beers a day on the weekends.  The  patient does have a history of cocaine use, but none recently.   FAMILY HISTORY:  The patient's father had an MI in his 2s.  The patient's  mother has diabetes, hypertension, and had an MI at the age of 55.  The  patient has a brother, who had an MI at the age of 76.  The patient has nine  sisters, all of which he claims to have gallbladder disease.   MEDICATIONS:  The patient is taking aspirin 81 daily.   ALLERGIES:  The patient has no known drug allergies.   REVIEW OF SYSTEMS:  Positive for dyspnea, chest pain, numbness, tingling,  some back pain, and calf cramps with activity.   PHYSICAL EXAMINATION:  VITAL SIGNS:  Temperature 97.2, heart rate 55, blood  pressure 122/69, respirations 18, O2 SAT is 98% on 2 liters.  GENERAL:  The patient is lying on the stretcher.  He is an obese white male  in no apparent distress.  HEENT:  The oropharynx is clear.  He has moist mucous membranes.  His  extraocular muscles are intact.  His pupils are equal, round, and reactive  to light and accommodation.   CARDIOVASCULAR:  He has distant heart sounds, but otherwise a regular rate  and rhythm with no murmurs, rubs, or gallops.  NECK:  There is no JVD and no carotid bruits.  There is also no  lymphadenopathy or thyromegaly.  PULMONARY:  The patient's lungs are clear to auscultation bilaterally.  No  wheezes, rales, or rhonchi.  ABDOMEN:  Soft, nontender, nondistended.  He is obese but with positive  normoactive bowel sounds.  EXTREMITIES:  The patient has trace edema to the mid tibial region.  He has  2 out of 4 bilateral dorsalis pedis pulses and 0 to 1 posterior tibial  pulses found with Doppler.  NEUROLOGIC:  The patient is alert and oriented x4.  Cranial nerves II-XII  are intact.  His motor strength is 5 out of 5 diffusely.   LABORATORY DATA:  CBC:  The patient has a white blood cell count of 6.7,  hemoglobin 13.5, hematocrit 39.0, platelets 219.  BMP:  He has a sodium of  142, potassium 3.9, chloride of 110, he has a bicarb of 23, BUN of 11,  creatinine 0.7, glucose 125.  VBG shows a pH of 7.38, pCO2 of 38.7, bicarb  of 23, acid base excess 2.0.  Fibrin split products less than 0.22.  Point-  of-care markers:  Myoglobin 101, CK-MB of 2.0, troponin-I of less than 0.05,  BNP of less than 30.0.   RADIOGRAPHIC DATA:  Chest x-ray shows cardiomegaly, but otherwise normal  with no acute changes.   ASSESSMENT AND PLAN:  This is a 55 year old male with possible history of  hypertension and heart disease presenting with four weeks of progressive  shortness of breath and chest pain.   PROBLEM LIST:  1.  Chest pain/shortness of breath.  This appears to be unstable angina as      the patient has had increasing pain in both frequency and intensity as      it was appearing at rest.  We will begin with aspirin 325 a day.  Will      also start Lopressor and nitroglycerin sublingual.  We will cycle     cardiac enzymes q.8h x3.  We will admit to the telemetry  floor.  We will      hold off on  heparin for now since the patient's pain has been present      for the past four weeks.  His point-of-care markers and the emergency      room electrocardiogram were normal.  We will monitor overnight and      consult Cardiology in the morning.  We will also check a homocystine      level as well as a two-dimensional echocardiogram.  We will also start      Niaspan to increase his HDL level.  2.  For deep vein thrombosis prophylaxis, the patient will be begun on      Lovenox.      Broadus John T. Pamalee Leyden, MD     WTP/MEDQ  D:  03/28/2006  T:  03/28/2006  Job:  [id number]

## 2011-01-08 NOTE — Discharge Summary (Signed)
[name] [name], [name] NO.:  [id number]   MEDICAL RECORD NO.:  [id number]          PATIENT TYPE:  INP   LOCATION:  3731                         FACILITY:  [facility]   PHYSICIAN:  [name] [name], MDDATE OF BIRTH:  11-Jul-1956   DATE OF ADMISSION:  03/28/2006  DATE OF DISCHARGE:  03/31/2006                                 DISCHARGE SUMMARY   REASON FOR ADMISSION:  Chest pain and shortness of breath.   1. Atypical chest pain.  2. Peripheral vascular disease.  3. Obesity.  4. Dyslipidemia.  5. Hypertension.  6. Sliding hiatal hernia with esophageal spasm.   CONSULTATIONS:  [name] [name], M.D.   PROCEDURES:  1. Portable chest x-ray on March 28, 2006 showed cardiomegaly with      scattered subsegmental atelectasis.  2. 2-D echo on March 29, 2006 showed normal left ventricular systolic      function and an ejection fraction estimated at 60% with no left      ventricular regional wall motion abnormalities.  3. Abdominal ultrasound performed on March 30, 2006 showed a mobile 8.5      mm solitary gallstone with no gallbladder wall thickening or      pericholecystic fluid.  There was diffuse, increased echogenicity of      the liver that may be related to fatty infiltration with an otherwise      normal ultrasound.  4. Cardiolite stress test was performed by cardiology on March 30, 2006.      Results showed subtle area of decreased activity on stress images in      the mid to basilar segment of the anterior wall suspicious for      reversible ischemia, but showed no focal wall motion abnormalities.  It      showed mild left ventricular cavitary dilatation on stress and rest      images with ejection fraction of 54%.  5. Barium swallow on March 31, 2006 showed a small sliding hiatal hernia      with intermittent spasm, but no stricture, reflux, or radiographic      changes of the esophagus.   ADMISSION LABORATORY DATA:  CBC showed white count of 6.7,  hemoglobin 13.5,  hematocrit 39, platelets 219,000.  BMP, patient had a sodium 142, potassium  3.9, chloride 110, bicarb 23, BUN 11, creatinine 0.7, glucose 125.  ABG  showed pH of 7.38, PCO2 of 38.7, bicarb of 23, acid base excess of 2.  Fiber  and split products less than 0.22.  Point of care markers showed myoglobin  101, CK-MB 2, troponin I of less than 0.05.  BNP of less than 30.   DISCHARGE LABORATORY DATA:  CBC showed white count of 8.5, hemoglobin 14.9,  hematocrit 42.7, platelet count of 221,000.  BMP shows sodium 139, potassium  4, chloride 106, bicarb 26, BUN 7, creatinine 1, glucose 112, calcium 9.6.   OTHER LABORATORY DATA:  Cardiac markers were negative x3 during hospital  stay.  Normal homocystine level of 10.7.  Normal LFTs with a total bilirubin  of 0.5, direct bilirubin was  0.1.  Alkaline phosphatase 47, AST 22, ALT 20,  total protein 6.3, albumin 3.1.  Urine drug screen, performed on day #3, we  negative.   HOSPITAL COURSE:  1. Chest pain and shortness of breath.  Patient initially presented with      retrosternal chest pain and shortness of breath x4 weeks.  He initially      had cardiac enzymes cycled x3, which were all negative.  He had      multiple EKGs during his hospital stay, which were all normal.  On      admission, patient was started on aspirin 325 daily.  He was also      started on metoprolol 2.5 mg 4 times a day as well as sublingual      nitroglycerin.  Because of the length of his chest pain, going on for 4      weeks, the decision was made to withhold heparin for time being.      Patient was also noted to have a decreased HDL level during      chemistries, so the decision was also to start him in Niaspan.  Patient      was admitted to the telemetry floor and monitored on telemetry      throughout his stay.  He continued to complain of chest pain on      hospital day #2 and it was subsequently decided to place patient on      morphine p.r.n. as well  as a nitroglycerin drip.  However, he was not      able to tolerate his nitroglycerin drip because of headache complaints.      His nitroglycerin drip was stopped on day #3, and his pain was managed      with morphine only.  On day #2, Dr. Algie [name] of cardiology, was      consulted and saw the patient.  He performed a Cardiolite stress test      on the patient on day #3, which was found to be normal.  Patient also      received his right upper quadrant ultrasound on day #3 as well, which      was found to be normal.  He received his barium swallow on day #4,      which showed results previously dictated.  On day #3 and #4, the      patient's chest pain began to regress and his nitroglycerin drip was      stopped.  On hospital day #3 and #4, his primary complaint was back      pain, which was controlled successfully with Percocet.  Patient was      pain free in terms of chest etiology at time of discharge and it was      felt that much of his chest pain most likely occurred from his hiatal      hernia and esophageal spasm.  2. Hypertension.  The patient was started on Lopressor when he was      admitted.  During his stay he was also began on hydrochlorothiazide as      well as lisinopril, and these were successfully controlling his blood      pressure at time of discharge and he was continued on these medication      at discharge.  3. Dyslipidemia.  Patient was found to have decreased HDL levels.  On      admission, he was begun on Niaspan.  He will have these  levels followed      up regularly with his primary care physician.  4. Hiatal hernia and intermittent esophageal spasm.  This was found on      barium swallow and was believed that the Protonix the patient was on      during his stay, possibly help with this pain, to help reduce the chest      pain he presented with.  He will have this followed up as an outpatient     with Dr. Charissa [name], at which time it will be decided whether  or      not he needs to be started on the calcium drip channel blocker or      nitrate medications.  5. Back pain.  The patient was controlled with Percocet during his stay.      However, he was discharged on Ultracet.  6. Obesity.  The patient was counseled on decreasing the salt and fat in      his diet, as well as improving his overall lifestyle.   DISCHARGE MEDICATIONS:  1. Aspirin 325 mg 1 p.o. daily.  2. Niaspan 500 mg 1 p.o. q.h.s. after aspirin.  3. Hydrochlorothiazide 12.5 mg 1 p.o. daily.  4. Lisinopril 10 mg 1 p.o. daily.  5. Protonix 40 mg 1 p.o. daily.  6. Ultracet 1 to 2 tablets p.o. q.4 hours p.r.n. back pain.   DISPOSITION:  The patient has a follow up appointment at the [facility] with Dr. Charissa [name] on April 07, 2006 at 10 a.m.  At the time of discharge, the patient was counseled on the importance of  improving his diet, decreasing his alcohol intake, stopping smoking, and  decreasing any use of illicit substances.      [name] John T. Pamalee Leyden, MD    WTP/MEDQ  D:  03/31/2006  T:  04/01/2006  Job:  [id number]

## 2011-01-08 NOTE — Discharge Summary (Signed)
NAMEEMITT, MAGLIONE NO.:  [id number]   MEDICAL RECORD NO.:  [id number]          PATIENT TYPE:  INP   LOCATION:  3735                         FACILITY:  [facility]   PHYSICIAN:  Levander Campion, M.D.  DATE OF BIRTH:  September 09, 1955   DATE OF ADMISSION:  03/17/2006  DATE OF DISCHARGE:  03/18/2006                                 DISCHARGE SUMMARY   DISCHARGE DIAGNOSES:  1.  Hypertension.  2.  Peripheral vascular disease.  3.  Chest pain.  4.  Dyspnea.  5.  Tobacco abuse.   DISCHARGE MEDICATIONS:  Aspirin 81 mg daily.   FOLLOWUP:  The patient has [place].  The patient is to return to his  cardiologist at [facility] to followup his chest pain.   CONSULTATIONS:  None.   PROCEDURE:  None.   HOSPITAL COURSE:  Chest pain.  Mr. Reali is a 55 year old male with a  history of hypertension, peripheral vascular disease, who presented  complaining of progressive dyspnea on exertion with progressive dyspnea at  rest and he has had episodes of substernal left sided sharp stabbing chest  pain with numbness, tingling, and radiation to both arms right greater than  left.  The chest pain is not relieved by rest but is completely relieved by  nitroglycerin in the emergency department.  The patient on admission was  unsure if he had any cardiac workup in the past.  His symptoms were  concerning for unstable angina and he had 3 sets of enzymes in the emergency  department that were all negative.  He had a negative D-dimer.  An EKG  showed normal sinus rhythm with no ST segment changes or T wave changes.  A  chest x-ray showed airway thickening suggestive of bronchitis or reactive  airway disease.  His symptoms despite his negative enzymes and normal EKG  were concerning for unstable angina.  He was admitted and his 2 full sets of  cardiac enzymes 2 hours apart and repeat EKG were ordered.  He was started  on nitro paste  and metoprolol for angina prophylaxis.  The plan was to  consult cardiology for a Myoview with a 2D echocardiogram to get a fasting  lipid panel and a TSH and a urine drug screen.  However, the patient's chest  pain resolved overnight and the following morning he refused all care by our  team or the cardiologists team and stated that 8 months ago he had a cardiac  catheterization at [facility] that was clear and that he and his wife were going  to go back to [facility] the following week and there was nothing we could do to  convince him to stay at [facility] so he was discharged with  instructions to followup with his cardiologist and to establish care with  Dr. Raul Del in [place].   The patient's decision to leave occurred before any more of his cardiac  workup could be completed other than the 2 regular sets of enzymes and the  repeat EKG showed no  changes or signs of acute coronary artery disease or  acute myocardial injury.  However, the rest of his workup was unable to be  performed because the patient insisted upon leaving.           ______________________________  Levander Campion, M.D.     JH/MEDQ  D:  03/21/2006  T:  03/22/2006  Job:  [id number]

## 2011-01-08 NOTE — H&P (Signed)
NAMEDEVINN, HURWITZ NO.:  [id number]   MEDICAL RECORD NO.:  [id number]          PATIENT TYPE:  INP   LOCATION:  3735                         FACILITY:  [facility]   PHYSICIAN:  Lupita Raider, M.D.   DATE OF BIRTH:  1955/08/25   DATE OF ADMISSION:  03/17/2006  DATE OF DISCHARGE:                                HISTORY & PHYSICAL   PRIMARY CARE PHYSICIAN:  Unassigned.   CHIEF COMPLAINT:  Chest pain and shortness of breath.   HISTORY OF PRESENT ILLNESS:  The patient is a 55 year old white male with a  history of hypertension, peripheral vascular disease, status post lower  extremity stents, now with the complaint of progressive dyspnea on exertion  with progression to dyspnea at rest.  He has episodes of substernal left-  sided, sharp, stabbing chest pain with numbness, tingling, radiating to both  arms, right greater than left.  No palpitations, hemoptysis, cough, syncope  or neurologic focality, GERD, dyspnea, nausea, vomiting, diarrhea, or  constipation.  The chest pain is not relieved by rest but is completely  relieved by nitroglycerin in the ED.  The patient is not sure where he had  his stents done.  He thinks they were done at [facility].  Unsure if he had a  cardiac work-up at that time.  He was admitted in February at [facility]  for chest pain and pneumonia.  He did not follow up after that  admission with cardiology or with establishment of a primary care physician  in [place].  He has not had a stress test or Myoview or any other cardiac work-  up as far as he knows.   REVIEW OF SYSTEMS:  Positive for intermittent headache for one month and  wheezing for one week.  Positive for decreased vision over the last six  months.  Positive for lower extremity edema and lower extremity cramps.  Positive for nasal congestion; otherwise negative except for the HPI.   PAST MEDICAL HISTORY:  1.  History of hypertension.  2.  Left ear hearing loss.  3.  Question  of a catheterization/peripheral vascular disease stents five      years ago.  4.  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   SOCIAL HISTORY:  Positive for tobacco, half pack a day.  The patient reports  drinking 12 to 15 beers per day up until two or three months ago when he  abruptly quit.  He does admit to drinking one to two beers this afternoon,  however.  Lives with his new wife and daughter.  Denies illicit drug use or  IV drug use.  He does light duty painting for employment currently.  Used to  be a heavy Location manager.   FAMILY HISTORY:  Father recently died at the age of 32 of an MI.  Mother is  living with diabetes and has had two MI's.  Eleven siblings with diabetes  and cardiovascular disease.   ALLERGIES:  No known drug allergies.   MEDICATIONS:  Aspirin 81  mg.   PHYSICAL EXAMINATION:  VITAL SIGNS:  Temperature 97.2, heart rate 94,  respirations 23, BP 137/85, 97% on room air.  GENERAL:  This is a well-appearing, obese, white male sitting up in bed,  pleasant, conversant, in no apparent distress.  He has a mild yellow tinge  to his skin and he is tattooed.  HEENT:  PERRL.  EOMI.  Moist mucous membranes.  NECK:  Large neck that is supple.  No JVD, lymphadenopathy or carotid  bruits.  CARDIOVASCULAR:  Regular rate and rhythm.  No murmurs, rubs or gallops.  Heart sounds are difficult to auscultate secondary to body habitus.  PMI not  displaced.  PULMONARY:  Mild inspiratory wheezes bilaterally, but otherwise clear to  auscultation.  No crackles or rhonchi.  No work of breathing.  ABDOMEN:  Soft.  Positive bowel sounds.  Nontender, nondistended, but obese.  No palpable organomegaly.  EXTREMITIES:  Normal.  2+ pulses radials.  Faint pedal pulses.  Otherwise  extremities warm and well perfused.  No edema.  NEUROLOGIC:  No focalities.  Cranial nerves II-XII intact. Strength 5/5 and  equal.  Normal gait.   LABORATORY DATA:   Point-of-care enzymes at 1 a.m., 2 a.m. and 3 a.m. were  negative.  D-dimer negative.  INR 1.  Sodium 138, potassium 3.8, chloride  107, bicarb 13, glucose 112, pH 7.4.  Hemoglobin 15.6, hematocrit 40%.  EKG  showed normal sinus rhythm.  Chest x-ray:  Airway thickening suggestive of  bronchitis or reactive airway disease.  Mild chronic prominence of the  interstitium.   ASSESSMENT/PLAN:  This is a 55 year old white male with a history of  peripheral vascular disease, hypertension, alcohol abuse, and question of a  congenital heart anomaly now with unstable angina.   Problem #1.  Unstable angina.  Negative for acute myocardial infarction.  Will follow point-of-care with full enzyme x2 and repeat his EKG.  Continue  Nitrol paste with the addition of Metoprolol for angina prophylaxis.  Cardiology consult for Myoview with 2-D echo, fasting lipid panel and TSH,  urine drug screen.  This is likely coronary artery disease given atypical  angina picture with chronic peripheral vascular disease.  Will check BNP as  well.   Problem #2.  Shortness of breath.  Likely related to #1.  Chest x-ray  negative for edema.  Will check BNP and repeat a chest x-ray, PA and  lateral.  No history of reactive airway disease.  More likely etiology  congestive but will start Tegaderm patch and offer smoking  cessation.  No  hypoxia currently.  Will follow.  No diuresis needed at this time.  D-dimer  was negative.   Problem #3.  Peripheral vascular disease.  Poor distal pulses in the lower  extremities.  Leg pain with remote paresthesias complaints.  We will check  venous arterial Dopplers and attempt to obtain records from [facility] about his  prior stent placement.  D-dimer negative.   Problem #4.  Question jaundice.  Will check a CMET given his alcohol abuse  history.  No known history of liver disease.  Denies IV drug abuse.  No need for Ativan protocol at this time given his recent cessation but will have   __________  procedure precautions from deep venous thromboses.  Also check  urine drug screen.   Problem #5.  Question of congenital anomaly.  The patient unclear what his  heart condition is.  Reports it is very rare and has two heart vessels  instead of  four.  He believes he was seen at [facility] for this as well.  We  will check an echo as above and consider heart MRI if we cannot obtain  records.   Problem #6.  Hypertension.  Not currently hypertensive but will start beta  blocker for anginal prophylaxis. Monitor his blood pressure. Would also  consider starting an ACE inhibitor if he needs further control and his  creatinine is within normal limits.   Problem #7.  Fluids, electrolytes, and nutrition/GI.  __________ , heart  healthy diet.  Follow electrolytes and replace p.r.n.   Problem #8.  Tobacco use.  Nicoderm 7 mg patch daily.  Smoking cessation  consult.   Problem #9.  Prophylaxis.  Heart healthy diet and ambulation.  To consider  __________  if the patient remains immobile tomorrow.   Problem #10.  Disposition.  Will follow __________  recommendations, and  complete risk stratification for coronary artery disease.  Anticipate one to  two day admission.           ______________________________  Lupita Raider, M.D.     KS/MEDQ  D:  03/18/2006  T:  03/18/2006  Job:  [id number]

## 2011-01-08 NOTE — H&P (Signed)
NAMEFERNAND, [name]:  [id number]   MEDICAL RECORD NO.:  [id number]          PATIENT TYPE:  INP   LOCATION:  2010                         FACILITY:  [facility]   PHYSICIAN:  Olga Millers, M.D. LHCDATE OF BIRTH:  1956-01-01   DATE OF ADMISSION:  10/19/2005  DATE OF DISCHARGE:                                HISTORY & PHYSICAL   [name] is a 55 year old male with past medical history of hypertension  and coronary disease who is transferred from [facility] with chest  pain. The patient apparently received previous cardiac care at [facility]. He had a catheterization at [facility] approximately two years ago  following an abnormal stress test. He was told there was blockage and only  two arteries supplied blood to his heart but I do not have those records  available. He apparently did not have PCI. Over the past three days, he has  complained of substernal chest pain. However, he has also had URI-type  symptoms. He has had a fever up to 104 by his report as well as a productive  cough. He had chest pain Sunday morning, Monday and recurrent pain today  lasting all day. The pain is described as someone standing on my chest. It  increases with cough and inspiration as well as certain movements. There is  associated shortness of breath, nausea, vomiting and diaphoresis. Because of  his pain, he went to [facility] and he was transferred for further  management. He takes only an aspirin a day at home.   ALLERGIES:  There are no known drug allergies.   SOCIAL HISTORY:  He does have a history of tobacco use as well as substance  abuse but states he has not consumed substances or tobacco over the past  year. There is occasional alcohol use.   FAMILY HISTORY:  Positive for coronary artery disease.   PAST MEDICAL HISTORY:  He denies any diabetes mellitus or hyperlipidemia but  he does have a history of hypertension. He has a history of coronary disease  as outlined in the HPI. He has had no previous surgeries.   REVIEW OF SYSTEMS:  He denies any headaches, fevers or chills at present. He  did have subjective fever and states it was up to 104 earlier this week.  There is a productive cough but no hemoptysis. There is no dysphagia,  odynophagia, melena, or hematochezia. There is no dysuria or hematuria.  There is no rash or seizure activity. There is no orthopnea, PND, or pedal  edema. He typically does not have exertional dyspnea or chest pain. The  remaining systems are negative.   PHYSICAL EXAMINATION:  His blood pressure is 120/68 and his pulse is 78. He  is afebrile. He is well-developed and somewhat obese. He is in no acute  distress. His skin is warm and dry. He has multiple tattoos. He does not  appear to be depressed and there is no peripheral clubbing. His HEENT is  unremarkable. Normal eyelids. His neck is supple with normal upstroke  bilaterally and I cannot appreciate bruits. There  is no jugular venous  distention and no thyromegaly noted. His chest is clear other than a mild  expiratory wheeze. His cardiovascular exam reveals a regular rate and  rhythm. Normal S1 and S2. There are no murmurs, rubs or gallops noted.  Abdominal exam is nontender, and nondistended. Positive bowel sounds. No  hepatosplenomegaly. No masses appreciated. There was no abdominal bruit. He  has 2+ femoral pulses bilaterally and no bruits. His extremities show no  edema and I can palpate no cords. He has 2+ dorsalis pedis pulses  bilaterally. Neurologic exam is grossly intact.   Electrocardiogram shows a normal sinus rhythm at a rate of 73. The axis is  normal. There is no RV conduction delay but there are no ST changes noted.  His white blood cell count is 5.1 and his hemoglobin is 14.3 with a  hematocrit of 41.5. His platelet count is 214,000. His d-dimer is normal.  His potassium was decreased at 3.3. His renal function is normal. His  initial  enzymes are negative. Chest x-ray showed prominent bibasilar  interstitial markings.   DIAGNOSES:  1.  Atypical chest pain.  2.  History of coronary disease.  3.  History of hypertension.   PLAN:  [name] presents with chest pain that is atypical in nature. He has  what sounds to be upper respiratory tract infection-type symptoms and his  pain may be musculoskeletal. We will rule out myocardial infarction with  serial enzymes. If his enzymes are negative, we will plan a Myoview for risk  stratification. I will add a Z-Pak for his URI and we will also check a  urine drug screen. We will treat with aspirin, Lopressor and IV heparin  until he rules out. Given his history of coronary disease, he would also  benefit from a Statin and we will begin Zocor 40 milligrams p.o. q.h.s.           ______________________________  Olga Millers, M.D. [facility]     BC/MEDQ  D:  10/19/2005  T:  10/20/2005  Job:  [id number]

## 2011-02-18 DIAGNOSIS — R55 Syncope and collapse: Secondary | ICD-10-CM

## 2011-02-18 DIAGNOSIS — R0989 Other specified symptoms and signs involving the circulatory and respiratory systems: Secondary | ICD-10-CM

## 2011-02-18 DIAGNOSIS — R0609 Other forms of dyspnea: Secondary | ICD-10-CM

## 2011-02-19 ENCOUNTER — Encounter: Payer: Self-pay | Admitting: Cardiology

## 2011-02-19 DIAGNOSIS — R55 Syncope and collapse: Secondary | ICD-10-CM

## 2011-02-24 ENCOUNTER — Emergency Department (HOSPITAL_COMMUNITY)
Admission: EM | Admit: 2011-02-24 | Discharge: 2011-02-24 | Disposition: A | Discharge status: Home / Self Care | Payer: Medicare Other | Source: Home / Self Care | Attending: Emergency Medicine | Admitting: Emergency Medicine

## 2011-02-24 ENCOUNTER — Emergency Department (HOSPITAL_COMMUNITY): Payer: Medicare Other

## 2011-02-24 ENCOUNTER — Inpatient Hospital Stay (HOSPITAL_COMMUNITY)
Admission: AD | Admit: 2011-02-24 | Discharge: 2011-02-26 | DRG: 287 | Disposition: A | Discharge status: Home / Self Care | Payer: Medicare Other | Source: Other Acute Inpatient Hospital | Attending: Cardiovascular Disease | Admitting: Cardiovascular Disease

## 2011-02-24 DIAGNOSIS — Z79899 Other long term (current) drug therapy: Secondary | ICD-10-CM

## 2011-02-24 DIAGNOSIS — R0789 Other chest pain: Principal | ICD-10-CM | POA: Diagnosis present

## 2011-02-24 DIAGNOSIS — Z7982 Long term (current) use of aspirin: Secondary | ICD-10-CM

## 2011-02-24 DIAGNOSIS — F411 Generalized anxiety disorder: Secondary | ICD-10-CM | POA: Insufficient documentation

## 2011-02-24 DIAGNOSIS — I10 Essential (primary) hypertension: Secondary | ICD-10-CM | POA: Diagnosis present

## 2011-02-24 DIAGNOSIS — J4489 Other specified chronic obstructive pulmonary disease: Secondary | ICD-10-CM | POA: Diagnosis present

## 2011-02-24 DIAGNOSIS — R079 Chest pain, unspecified: Secondary | ICD-10-CM | POA: Insufficient documentation

## 2011-02-24 DIAGNOSIS — I2510 Atherosclerotic heart disease of native coronary artery without angina pectoris: Secondary | ICD-10-CM | POA: Diagnosis present

## 2011-02-24 DIAGNOSIS — R0602 Shortness of breath: Secondary | ICD-10-CM | POA: Insufficient documentation

## 2011-02-24 DIAGNOSIS — F17200 Nicotine dependence, unspecified, uncomplicated: Secondary | ICD-10-CM | POA: Diagnosis present

## 2011-02-24 DIAGNOSIS — M4800 Spinal stenosis, site unspecified: Secondary | ICD-10-CM | POA: Diagnosis present

## 2011-02-24 DIAGNOSIS — F341 Dysthymic disorder: Secondary | ICD-10-CM | POA: Diagnosis present

## 2011-02-24 DIAGNOSIS — R110 Nausea: Secondary | ICD-10-CM | POA: Insufficient documentation

## 2011-02-24 DIAGNOSIS — E876 Hypokalemia: Secondary | ICD-10-CM | POA: Diagnosis present

## 2011-02-24 DIAGNOSIS — E785 Hyperlipidemia, unspecified: Secondary | ICD-10-CM | POA: Diagnosis present

## 2011-02-24 DIAGNOSIS — R61 Generalized hyperhidrosis: Secondary | ICD-10-CM | POA: Insufficient documentation

## 2011-02-24 DIAGNOSIS — J449 Chronic obstructive pulmonary disease, unspecified: Secondary | ICD-10-CM | POA: Diagnosis present

## 2011-02-24 DIAGNOSIS — Z8249 Family history of ischemic heart disease and other diseases of the circulatory system: Secondary | ICD-10-CM

## 2011-02-24 DIAGNOSIS — Q245 Malformation of coronary vessels: Secondary | ICD-10-CM

## 2011-02-24 DIAGNOSIS — E669 Obesity, unspecified: Secondary | ICD-10-CM | POA: Diagnosis present

## 2011-02-24 LAB — DIFFERENTIAL
Basophils Absolute: 0.1 10*3/uL (ref 0.0–0.1)
Basophils Relative: 1 % (ref 0–1)
Lymphocytes Relative: 22 % (ref 12–46)
Monocytes Relative: 11 % (ref 3–12)
Neutro Abs: 7.2 10*3/uL (ref 1.7–7.7)
Neutrophils Relative %: 65 % (ref 43–77)

## 2011-02-24 LAB — BASIC METABOLIC PANEL
Calcium: 8.9 mg/dL (ref 8.4–10.5)
Creatinine, Ser: 0.81 mg/dL (ref 0.50–1.35)
GFR calc Af Amer: >60 mL/min (ref >60)
GFR calc Af Amer: >60 mL/min (ref >60)
GFR calc non Af Amer: >60 mL/min (ref >60)
GFR calc non Af Amer: >60 mL/min (ref >60)
Glucose, Bld: 134 mg/dL — ABNORMAL HIGH (ref 70–99)
Potassium: 2.9 mEq/L — ABNORMAL LOW (ref 3.5–5.1)
Sodium: 137 mEq/L (ref 135–145)

## 2011-02-24 LAB — CBC
HCT: 37.9 % — ABNORMAL LOW (ref 39.0–52.0)
Hemoglobin: 13.2 g/dL (ref 13.0–17.0)
Hemoglobin: 12.1 g/dL — ABNORMAL LOW (ref 13.0–17.0)
MCH: 31.8 pg (ref 26.0–34.0)
MCH: 32.4 pg (ref 26.0–34.0)
MCHC: 34.8 g/dL (ref 30.0–36.0)
RBC: 3.74 MIL/uL — ABNORMAL LOW (ref 4.22–5.81)

## 2011-02-24 LAB — CARDIAC PANEL(CRET KIN+CKTOT+MB+TROPI)
CK, MB: 3.7 ng/mL (ref 0.3–4.0)
Relative Index: 1.1 (ref 0.0–2.5)
Total CK: 336 U/L — ABNORMAL HIGH (ref 7–232)
Troponin I: <0.3 ng/mL (ref <0.30)

## 2011-02-24 LAB — GLUCOSE, CAPILLARY: Glucose-Capillary: 114 mg/dL — ABNORMAL HIGH (ref 70–99)

## 2011-02-24 LAB — HEPARIN LEVEL (UNFRACTIONATED): Heparin Unfractionated: <0.1 IU/mL — ABNORMAL LOW (ref 0.30–0.70)

## 2011-02-24 LAB — CK TOTAL AND CKMB (NOT AT ARMC): CK, MB: 4.2 ng/mL — ABNORMAL HIGH (ref 0.3–4.0)

## 2011-02-25 DIAGNOSIS — I2510 Atherosclerotic heart disease of native coronary artery without angina pectoris: Secondary | ICD-10-CM

## 2011-02-25 LAB — BASIC METABOLIC PANEL
Chloride: 102 mEq/L (ref 96–112)
GFR calc Af Amer: >60 mL/min (ref >60)
GFR calc non Af Amer: >60 mL/min (ref >60)
Potassium: 3.2 mEq/L — ABNORMAL LOW (ref 3.5–5.1)

## 2011-02-25 LAB — CARDIAC PANEL(CRET KIN+CKTOT+MB+TROPI)
Relative Index: 1.5 (ref 0.0–2.5)
Total CK: 217 U/L (ref 7–232)
Troponin I: <0.3 ng/mL (ref <0.30)

## 2011-02-25 LAB — PROTIME-INR
INR: 1.02 (ref 0.00–1.49)
Prothrombin Time: 13.6 seconds (ref 11.6–15.2)

## 2011-02-26 DIAGNOSIS — R079 Chest pain, unspecified: Secondary | ICD-10-CM

## 2011-02-26 NOTE — Cardiovascular Report (Signed)
  NAMEORIS, STAFFIERI NO.:  [id number]  MEDICAL RECORD NO.:  [id number]  LOCATION:  6531                         FACILITY:  [facility]  PHYSICIAN:  Rollene Rotunda, MD, FACCDATE OF BIRTH:  04-Nov-1955  DATE OF PROCEDURE:  02/25/2011 DATE OF DISCHARGE:                           CARDIAC CATHETERIZATION   PRIMARY CARE PHYSICIAN:  Lia Hopping, MD  CARDIOLOGIST:  Learta Codding, MD, FACC  REASON FOR PRESENTATION:  Evaluate the patient with chest pain.  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  PROCEDURE NOTE:  Left heart catheterization was performed via the right radial artery.  The vessel was cannulated using anterior wall puncture. A #5-French arterial sheath was inserted via modified Seldinger technique.  Preformed Judkins and pigtail catheter were utilized.  The patient tolerated the procedure well, left the lab in stable condition.  RESULTS:  Hemodynamics:  LV 119/15, AO 123/80. Coronaries:  There was an anomalous left main takeoff off the right coronary cusp was seen previously.  This was selectively cannulated. Left main was normal through its course.  The LAD did not wrap the apex. There were diffuse proximal mid luminal irregularities, but no high- grade lesions.  There was small diagonals in the mid and distal vessel. The first diagonal had ostial 30% stenosis but was free of high-grade disease.  The circumflex in the AV groove had diffuse luminal irregularities.  There was mid 40% stenosis.  There was small marginals free of high-grade disease.  The right coronary artery was a very large dominant vessel.  There were mild proximal luminal irregularities.  The PDA was large with luminal irregularities.  Posterolateral was large and normal.  Left ventriculogram:  The left ventriculogram was obtained in the RAO projection.  The EF was 55% with no  regional wall motion abnormalities although the left ventricle was somewhat underfilled.  PLAN:  The patient will continue to be managed medically for primary risk reduction.  There is no objective evidence of ischemia, no obvious obstructive heart disease although he does have the anomalous anatomy. If he continues to have symptoms, we could consider CT angiography to evaluate the course of the left main though I would not strongly suspect this to be causing his symptoms.  This has not yet been clarified.     Rollene Rotunda, MD, [facility]     JH/MEDQ  D:  02/25/2011  T:  02/26/2011  Job:  [id number]  cc:   Lia Hopping, MD Learta Codding, MD,FACC  Electronically Signed by Rollene Rotunda MD [facility] on 02/26/2011 05:38:20 PM

## 2011-03-05 ENCOUNTER — Encounter: Payer: Self-pay | Admitting: *Deleted

## 2011-03-08 ENCOUNTER — Telehealth: Payer: Self-pay | Admitting: *Deleted

## 2011-03-08 NOTE — Telephone Encounter (Signed)
Message copied by Murriel Hopper on Mon Mar 08, 2011  4:13 PM ------      Message from: Lewayne Bunting E      Created: Mon Mar 08, 2011 11:39 AM       Patient will need cardiac CTA. I can discuss this if he has an early appointment if not he should be set up earlier. He is to be done at [facility].            ----- Message -----         From: Rollene Rotunda, MD         Sent: 02/25/2011  12:11 PM           To: Peyton Bottoms, MD            Michelle Piper,  This patient was to have an outpatient cath but came in with chest pain.  No objective evidence of CAD and no obstructive CAD.  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  The course of this was never clarified.  I will leave it to you to decide on CT if he has continued symptoms.  Thanks.  Leta Jungling

## 2011-03-08 NOTE — Telephone Encounter (Signed)
Patient has post hospital visit scheduled for 7/20 at 1:00.

## 2011-03-10 ENCOUNTER — Encounter: Payer: Medicare Other | Admitting: Cardiology

## 2011-03-11 NOTE — H&P (Signed)
NAMEWILLET, SCHLEIFER NO.:  [id number]  MEDICAL RECORD NO.:  [id number]  LOCATION:  2504                         FACILITY:  [facility]  PHYSICIAN:  Vesta Mixer, M.D. DATE OF BIRTH:  1956-05-02  DATE OF ADMISSION:  02/24/2011 DATE OF DISCHARGE:                             HISTORY & PHYSICAL   HISTORY. OF PRESENT ILLNESS:  [name] is a 55 year old gentleman with a history of COPD, chest pain and hypertension.  He also has a history of chronic back pain.  He is transferred from [facility] for episodes of chest pain.  The patient has history of chest pain for the past several weeks.  He apparently was seen at [facility] and was admitted.  I do not have any records on that.  He was apparently seen by Dr. Andee Lineman and was scheduled to have a heart catheterization last Friday.  Due to some scheduling problems, he was not able to show for the heart catheterization, but it was rescheduled for this Friday.  The patient had progressive shortness of breath and chest pain and presented to [facility] today.  He was transferred down for further evaluation.  The patient has severe chest pain and pressure as well shortness breath with any sort of exertion.  The pain radiates up to his neck.  It is associated with some diaphoresis.  He is very short of breath.  He notes that he is very short of breath walking to the mailbox.  CURRENT MEDICATIONS:  Include 1. Aspirin 325 mg a day. 2. Citalopram 40 mg a day. 3. HCTZ 25 mg a day. 4. Amlodipine 5 mg a day. 5. Imdur 30 mg a day. 6. Multivitamin once a day. 7. Omeprazole 40 mg a day. 8. Lorcet Plus 10 mg/500 mg every 4-6 hours as needed. 9. Xanax 1 mg three times a day as needed.  ALLERGIES:  He has no known drug allergies.  PAST MEDICAL HISTORY: 1. History of chest pain.  He has had a normal heart catheterization     in the past. 2. COPD - the patient continues to smoke. 3. History of back  pain.  The patient is unemployed because of his     chronic back pain.  SOCIAL HISTORY:  The patient still smokes.  He drinks alcohol occasionally.  He is unemployed.  FAMILY HISTORY:  He has extensive family history of coronary artery disease with heart attacks at early ages.  REVIEW OF SYSTEMS:  A 14-point review of systems is noted.  All systems were reviewed and are negative except for as otherwise noted in the HPI.  PHYSICAL EXAMINATION:  GENERAL:  He is a middle-aged gentleman in no acute distress. VITAL SIGNS:  His blood pressure is 127/67.  His heart rate is 71. HEENT:  Reveals that his carotids are 2+.  There is no JVD.  His sclerae are nonicteric.  His mucous membranes are moist. NECK:  Supple. LUNGS:  Exam reveals bilateral wheezes and rhonchi. BACK:  Nontender. HEART:  Regular rate S1 and S2.  His PMI is nondisplaced.  He has no murmurs. ABDOMEN:  Obese.  He has no hepatosplenomegaly.  There is  no guarding or rebound. EXTREMITIES:  He has no clubbing or cyanosis.  He has trace edema. There are no palpable cords. PULSES:  His pulses are intact. NEUROLOGIC:  Nonfocal.  Cranial nerves II-XII are intact and motor and sensory function intact.  EKG reveals normal sinus rhythm.  He has no ST or T-wave changes.  His laboratory data is pending.  IMPRESSION AND PLAN: 1. Chest pain.  The patient has a history of chest pain in the past     and had normal coronary arteries.  To be more specific he did have     an anomalous left coronary artery that rose from the right coronary     cusp, but there were no significant atherosclerotic lesions to     suggest ischemia.  Given his symptoms and a strong family history,     the fact that he continues to smoke.  We will proceed with cardiac     catheterization for further evaluation. 2. Chronic obstructive pulmonary disease.  The patient has significant     wheezing.  This may be because of his chest pain or shortness     breath.   We will start him on albuterol.  I have instructed him to     stop smoking. 3. Back pain - this is a chronic problem.     Vesta Mixer, M.D.     PJN/MEDQ  D:  02/24/2011  T:  02/25/2011  Job:  [id number]  cc:   Learta Codding, MD,FACC Lia Hopping, MD  Electronically Signed by Kristeen Miss M.D. on 03/11/2011 02:45:51 PM

## 2011-03-12 ENCOUNTER — Encounter: Payer: Medicare Other | Admitting: Cardiology

## 2011-03-15 ENCOUNTER — Encounter: Payer: Self-pay | Admitting: Cardiology

## 2011-03-19 NOTE — Discharge Summary (Signed)
NAMEAHMEER, TUMAN NO.:  [id number]  MEDICAL RECORD NO.:  [id number]  LOCATION:  6531                         FACILITY[facility]  [facility]  PHYSICIAN:  Rollene Rotunda, MD, FACCDATE OF BIRTH:  [birth year]  DATE OF ADMISSION:  02/24/2011 DATE OF DISCHARGE:  02/26/2011                              DISCHARGE SUMMARY   PROCEDURES: 1. Cardiac catheterization. 2. Coronary arteriogram. 3. Left ventriculogram. 4. Portable chest x-ray.  PRIMARY FINAL DISCHARGE DIAGNOSIS:  Chest pain, nonobstructive disease by cardiac catheterization this admission.  SECONDARY DIAGNOSES: 1. Chronic obstructive pulmonary disease. 2. Hypertension. 3. Chronic back pain with spinal stenosis, facet arthropathy, epidural     lymphomatosis, lumbago, and lumbar radiculopathy. 4. Anxiety disorder. 5. Hyperlipidemia. 6. Depression. 7. Obesity. 8. History of ethanol abuse. 9. Family history of coronary artery disease. 10.Remote history of cocaine use.  TIME AT DISCHARGE:  34 minutes.  HOSPITAL COURSE:  Mr. Sopko is a 55 year old male with a history of chest pain and nonobstructive coronary artery disease.  He is followed in [place].  He was seen at [facility] and scheduled for catheterization but had recurrent chest pain and came to the hospital where he was admitted for further evaluation.  His cardiac enzymes were negative for MI although his initial CKs were elevated.  The index and troponins were all negative.  He had some elevation in his blood sugars between 126-134 fasting.  He was hyperkalemic and this was supplemented.  On February 25, 2011, he had a cardiac catheterization which showed an anomalous left system off the right cusp.  Left main had no disease.  The LAD had diffuse irregularities.  D1 was 30% and the circumflex 40% stenosed with no other notable disease.  His EF was 55% with no regional wall motion abnormalities.  There was no objective evidence of ischemia and no obvious  obstructive heart disease despite the anomalous coronary anatomy.  A smoking cessation consult was called and the patient was encouraged to quit completely.  On February 26, 2011, Mr. Labell was evaluated by Dr. Antoine Poche.  He is ambulating without chest pain or shortness of breath.  He is considered stable for discharge, to follow up as an outpatient.  DISCHARGE INSTRUCTIONS: 1. His activity level is to be increased gradually. 2. He is encouraged to stick to a low-sodium, low-carbohydrate diet. 3. He is to follow up with Dr. Olena Leatherwood and with Dr. Andee Lineman on March 12, 2011 at 1:00 p.m.  DISCHARGE MEDICATIONS: 1. Celexa 40 mg a day. 2. Xanax 1 mg t.i.d. p.r.n. 3. Amlodipine 5 mg daily. 4. Multivitamins daily. 5. Isosorbide dinitrate is discontinued. 6. Lortab 10/500 q.6 h. p.r.n. as prior to admission. 7. Aspirin 325 mg a day. 8. Omeprazole 40 mg a day. 9. Trazodone 100 mg at bedtime p.r.n. as prior to admission. 10.Hydrochlorothiazide 25 mg daily as prior to admission. 11.Potassium 10 mEq daily.     Theodore Demark, PA-C ______________________________ Rollene Rotunda, MD, [facility]    RB/MEDQ  D:  02/26/2011  T:  02/26/2011  Job:  [id number]  cc:   Lia Hopping, MD  Electronically Signed by Theodore Demark PA-C on 03/09/2011 04:15:49 PM Electronically Signed by  Rollene Rotunda MD [facility] on 03/19/2011 07:48:52 PM

## 2011-04-02 ENCOUNTER — Encounter: Payer: Medicare Other | Admitting: Cardiology

## 2011-04-02 ENCOUNTER — Encounter: Payer: Self-pay | Admitting: Cardiology

## 2011-04-14 ENCOUNTER — Other Ambulatory Visit (HOSPITAL_COMMUNITY): Payer: Self-pay | Admitting: Neurosurgery

## 2011-04-14 DIAGNOSIS — M549 Dorsalgia, unspecified: Secondary | ICD-10-CM

## 2011-04-14 DIAGNOSIS — M5410 Radiculopathy, site unspecified: Secondary | ICD-10-CM

## 2011-04-19 ENCOUNTER — Ambulatory Visit (HOSPITAL_COMMUNITY): Admission: RE | Admit: 2011-04-19 | Payer: Medicare Other | Source: Ambulatory Visit

## 2011-04-22 ENCOUNTER — Other Ambulatory Visit (HOSPITAL_COMMUNITY): Payer: Self-pay | Admitting: Neurosurgery

## 2011-04-22 ENCOUNTER — Ambulatory Visit (HOSPITAL_COMMUNITY)
Admission: RE | Admit: 2011-04-22 | Discharge: 2011-04-22 | Disposition: A | Discharge status: Home / Self Care | Payer: Medicare Other | Source: Ambulatory Visit | Attending: Neurosurgery | Admitting: Neurosurgery

## 2011-04-22 DIAGNOSIS — M549 Dorsalgia, unspecified: Secondary | ICD-10-CM

## 2011-04-22 DIAGNOSIS — M5410 Radiculopathy, site unspecified: Secondary | ICD-10-CM

## 2011-04-22 DIAGNOSIS — M545 Low back pain, unspecified: Secondary | ICD-10-CM | POA: Insufficient documentation

## 2011-04-22 DIAGNOSIS — M5126 Other intervertebral disc displacement, lumbar region: Secondary | ICD-10-CM | POA: Insufficient documentation

## 2011-05-12 ENCOUNTER — Encounter: Payer: Self-pay | Admitting: Cardiology

## 2011-05-13 ENCOUNTER — Ambulatory Visit: Payer: Medicare Other | Admitting: Cardiology

## 2011-05-14 LAB — DIFFERENTIAL
Basophils Relative: 1
Eosinophils Absolute: 0.5
Eosinophils Relative: 6 — ABNORMAL HIGH
Lymphs Abs: 2.1
Monocytes Relative: 12
Neutrophils Relative %: 53

## 2011-05-14 LAB — CBC
HCT: 39.3
MCHC: 35.1
MCV: 92.1
RBC: 4.27

## 2011-05-14 LAB — BASIC METABOLIC PANEL
BUN: 16
CO2: 24
Chloride: 106
Creatinine, Ser: 0.89
GFR calc Af Amer: >60
Potassium: 3.9

## 2011-05-14 LAB — POCT CARDIAC MARKERS: Myoglobin, poc: 84.7

## 2011-05-17 LAB — CBC
HCT: 40.6
Hemoglobin: 14.3
MCHC: 35.2
MCV: 92.6
MCV: 92.8
Platelets: 237
RBC: 4.38
WBC: 11.4 — ABNORMAL HIGH
WBC: 9.6

## 2011-05-17 LAB — DIFFERENTIAL
Basophils Absolute: 0.1
Basophils Absolute: 0.1
Eosinophils Absolute: 0.6
Lymphocytes Relative: 25
Lymphs Abs: 3
Neutro Abs: 7
Neutrophils Relative %: 62
Neutrophils Relative %: 50

## 2011-05-17 LAB — POCT CARDIAC MARKERS
CKMB, poc: 3.1
Myoglobin, poc: 163
Operator id: 216461
Operator id: 211291
Troponin i, poc: <0.05

## 2011-05-17 LAB — BASIC METABOLIC PANEL
BUN: 11
Chloride: 104
Creatinine, Ser: 1.19
GFR calc non Af Amer: >60

## 2011-05-17 LAB — COMPREHENSIVE METABOLIC PANEL
BUN: 9
CO2: 24
Chloride: 103
Creatinine, Ser: 1.01
GFR calc non Af Amer: >60
Total Bilirubin: 0.6

## 2011-05-18 LAB — DIFFERENTIAL
Basophils Absolute: 0
Basophils Absolute: 0.1
Basophils Absolute: 0.1
Basophils Relative: 0
Basophils Relative: 1
Basophils Relative: 1
Eosinophils Absolute: 0
Eosinophils Absolute: 0.3
Eosinophils Absolute: 0.3
Lymphs Abs: 1
Monocytes Absolute: 1.1 — ABNORMAL HIGH
Monocytes Relative: 10
Neutro Abs: 7
Neutro Abs: 9.5 — ABNORMAL HIGH
Neutrophils Relative %: 85 — ABNORMAL HIGH
Neutrophils Relative %: 63
Neutrophils Relative %: 67

## 2011-05-18 LAB — CBC
HCT: 36.2 — ABNORMAL LOW
HCT: 38 — ABNORMAL LOW
Hemoglobin: 12.6 — ABNORMAL LOW
Hemoglobin: 13
Hemoglobin: 13.7
MCHC: 34.3
MCHC: 34.2
MCHC: 34.6
MCHC: 36
MCV: 93.4
MCV: 92.9
MCV: 95
Platelets: 246
Platelets: 248
Platelets: 271
RBC: 3.73 — ABNORMAL LOW
RBC: 4.02 — ABNORMAL LOW
RDW: 13.1
RDW: 12.9
RDW: 13.2
RDW: 13.4
WBC: 11 — ABNORMAL HIGH
WBC: 11.6 — ABNORMAL HIGH

## 2011-05-18 LAB — POCT I-STAT, CHEM 8
Calcium, Ion: 1.08 — ABNORMAL LOW
Calcium, Ion: 1.04 — ABNORMAL LOW
Creatinine, Ser: 1.3
Glucose, Bld: 125 — ABNORMAL HIGH
HCT: 41
HCT: 37 — ABNORMAL LOW
Hemoglobin: 13.9
Hemoglobin: 12.6 — ABNORMAL LOW
Potassium: 3.2 — ABNORMAL LOW
Sodium: 137
TCO2: 21
TCO2: 22

## 2011-05-18 LAB — BASIC METABOLIC PANEL
BUN: 11
CO2: 24
CO2: 25
Calcium: 8.7
Calcium: 8.8
Chloride: 102
Creatinine, Ser: 1.14
GFR calc Af Amer: >60
GFR calc non Af Amer: >60
Glucose, Bld: 122 — ABNORMAL HIGH
Glucose, Bld: 172 — ABNORMAL HIGH
Glucose, Bld: 230 — ABNORMAL HIGH
Potassium: 4.1
Potassium: 4.5
Sodium: 137
Sodium: 138

## 2011-05-18 LAB — B-NATRIURETIC PEPTIDE (CONVERTED LAB): Pro B Natriuretic peptide (BNP): <30

## 2011-05-18 LAB — POCT CARDIAC MARKERS
CKMB, poc: 1.1
CKMB, poc: 1.3
CKMB, poc: <1 — ABNORMAL LOW
Myoglobin, poc: 156
Myoglobin, poc: 277
Myoglobin, poc: 61.8
Operator id: 234501
Operator id: 151321

## 2011-05-24 LAB — CBC
Platelets: 264
RDW: 12.6

## 2011-05-24 LAB — URINALYSIS, ROUTINE W REFLEX MICROSCOPIC
Ketones, ur: NEGATIVE
Nitrite: NEGATIVE
Protein, ur: NEGATIVE
Urobilinogen, UA: 1

## 2011-05-24 LAB — COMPREHENSIVE METABOLIC PANEL
ALT: 21
AST: 21
Albumin: 3.8
Alkaline Phosphatase: 46
BUN: 15
GFR calc Af Amer: >60
Potassium: 3.7
Sodium: 137
Total Protein: 6.8

## 2011-05-24 LAB — DIFFERENTIAL
Basophils Relative: 0
Monocytes Absolute: 1.3 — ABNORMAL HIGH
Monocytes Relative: 10
Neutro Abs: 8.7 — ABNORMAL HIGH

## 2011-05-24 LAB — POCT CARDIAC MARKERS: Troponin i, poc: <0.05

## 2011-05-26 LAB — STREP A DNA PROBE: Group A Strep Probe: NEGATIVE

## 2011-05-31 ENCOUNTER — Encounter (HOSPITAL_COMMUNITY): Payer: Self-pay

## 2011-05-31 ENCOUNTER — Emergency Department (HOSPITAL_COMMUNITY)
Admission: EM | Admit: 2011-05-31 | Discharge: 2011-05-31 | Disposition: A | Discharge status: Home / Self Care | Payer: Medicare Other | Attending: Emergency Medicine | Admitting: Emergency Medicine

## 2011-05-31 DIAGNOSIS — M5440 Lumbago with sciatica, unspecified side: Secondary | ICD-10-CM

## 2011-05-31 DIAGNOSIS — R5381 Other malaise: Secondary | ICD-10-CM | POA: Insufficient documentation

## 2011-05-31 DIAGNOSIS — M549 Dorsalgia, unspecified: Secondary | ICD-10-CM | POA: Insufficient documentation

## 2011-05-31 DIAGNOSIS — M5430 Sciatica, unspecified side: Secondary | ICD-10-CM | POA: Insufficient documentation

## 2011-05-31 DIAGNOSIS — R209 Unspecified disturbances of skin sensation: Secondary | ICD-10-CM | POA: Insufficient documentation

## 2011-05-31 DIAGNOSIS — Z79899 Other long term (current) drug therapy: Secondary | ICD-10-CM | POA: Insufficient documentation

## 2011-05-31 DIAGNOSIS — R5383 Other fatigue: Secondary | ICD-10-CM | POA: Insufficient documentation

## 2011-05-31 MED ORDER — HYDROMORPHONE HCL 1 MG/ML IJ SOLN
2.0000 mg | Freq: Once | INTRAMUSCULAR | Status: AC
  Administered 2011-05-31: 2 mg via INTRAVENOUS
  Filled 2011-05-31: qty 2

## 2011-05-31 MED ORDER — ONDANSETRON HCL 4 MG/2ML IJ SOLN
4.0000 mg | Freq: Once | INTRAMUSCULAR | Status: AC
  Administered 2011-05-31: 4 mg via INTRAVENOUS
  Filled 2011-05-31: qty 2

## 2011-05-31 MED ORDER — SODIUM CHLORIDE 0.9 % IV SOLN
Freq: Once | INTRAVENOUS | Status: AC
  Administered 2011-05-31: 02:00:00 via INTRAVENOUS

## 2011-05-31 NOTE — ED Provider Notes (Signed)
History     CSN: [id number] Arrival date & time: 05/31/2011 12:55 AM  Chief Complaint  Patient presents with  . Back Pain    (Consider location/radiation/quality/duration/timing/severity/associated sxs/prior treatment) HPI Comments: Seen 0115. Patient with h/o back pain s/p laminectomies x 2 who has had increased lower back pain x 6 weeks. States pain intensified 6 weeks ago when he was getting up out of a chair. He has been seen by his neurosurgeon Dr. Lovell Sheehan. He has had 2 MRIs done since pain began, the most recent 05/25/11 at [facility] which was carried down to the neurosurgeon's office.Pain is across lower back and extends down both legs. Increased weakness in both legs, left worse than the right. Now unable to pick his left leg off the bed without severe back pain. Scheduled to see the neurosurgeon this week. Is on analgesics regularly without relief of the pain.  Patient is a 55 y.o. male presenting with back pain.  Back Pain  This is a chronic (Patient with chronic back pain s/p surgeries x 2 with 6 weeks of increased pain to lower back.) problem. Episode onset: 6 weeks. The problem occurs constantly. The problem has been gradually worsening. The pain is associated with no known injury. The pain is present in the lumbar spine. The quality of the pain is described as shooting and stabbing. Radiates to: radiates down both legs. The pain is at a severity of 10/10. The pain is severe. The symptoms are aggravated by bending, twisting and certain positions (walking). The pain is the same all the time. Associated symptoms include numbness, leg pain, tingling and weakness. Pertinent negatives include no perianal numbness and no bladder incontinence. He has tried analgesics and NSAIDs for the symptoms. The treatment provided no relief.    Past Medical History  Diagnosis Date  . COPD (chronic obstructive pulmonary disease)   . Hypertension   . Chronic back pain   . Anxiety disorder   .  Hyperlipidemia   . Depression   . Obesity   . History of alcohol abuse   . History of cocaine abuse     Past Surgical History  Procedure Date  . 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     Family History  Problem Relation Age of Onset  . Coronary artery disease      family h/o  . Heart attack      family h/o    History  Substance Use Topics  . Smoking status: Current Everyday Smoker  . Smokeless tobacco: Not on file  . Alcohol Use: Yes     drinks occasionally      Review of Systems  Genitourinary: Negative for bladder incontinence.  Musculoskeletal: Positive for back pain.  Neurological: Positive for tingling, weakness and numbness.  All other systems reviewed and are negative.    Allergies  Review of patient's allergies indicates no known allergies.  Home Medications   Current Outpatient Rx  Name Route Sig Dispense Refill  . ALBUTEROL SULFATE HFA 108 (90 BASE) MCG/ACT IN AERS Inhalation Inhale 2 puffs into the lungs every 4 (four) hours.     . ALPRAZOLAM 1 MG PO TABS Oral Take 1 mg by mouth 3 (three) times daily as needed.      Marland Kitchen AMLODIPINE BESYLATE 5 MG PO TABS Oral Take 5 mg by mouth daily.      . ASPIRIN 81 MG  PO TABS Oral Take 81 mg by mouth daily.      Marland Kitchen CITALOPRAM HYDROBROMIDE 20 MG PO TABS Oral Take 40 mg by mouth daily.     Marland Kitchen HYDROCHLOROTHIAZIDE 25 MG PO TABS Oral Take 25 mg by mouth daily. For blood pressure    . HYDROXYZINE HCL 25 MG PO TABS Oral Take 25 mg by mouth at bedtime.      . IBUPROFEN 600 MG PO TABS Oral Take 600 mg by mouth 3 (three) times daily.      . IPRATROPIUM-ALBUTEROL 0.5-2.5 (3) MG/3ML IN SOLN Nebulization Take 3 mLs by nebulization every 6 (six) hours.      . ISOSORBIDE DINITRATE 10 MG PO TABS Oral Take 30 mg by mouth 1 day or 1 dose.      Marland Kitchen OMEPRAZOLE 40 MG PO CPDR Oral Take 40 mg by mouth daily.      .  OXYCODONE HCL 15 MG PO TB12 Oral Take 15 mg by mouth 4 (four) times daily.      Marland Kitchen POTASSIUM CHLORIDE 10 MEQ PO TBCR Oral Take 10 mEq by mouth daily.      . BUDESONIDE-FORMOTEROL FUMARATE 160-4.5 MCG/ACT IN AERO Inhalation Inhale 1 puff into the lungs 2 (two) times daily.      Marland Kitchen LISINOPRIL 10 MG PO TABS Oral Take 10 mg by mouth daily.      Marland Kitchen LOVASTATIN 20 MG PO TABS Oral Take 20 mg by mouth daily.     . DESYREL PO Oral Take 50 mg by mouth at bedtime. For sleep       BP 132/67  Pulse 88  Temp(Src) 100.7 F (38.2 C) (Oral)  Resp 18  Ht 6\' 2"  (1.88 m)  Wt 330 lb (149.687 kg)  BMI 42.37 kg/m2  SpO2 96%  Physical Exam  Constitutional: He appears well-developed and well-nourished. He appears distressed.  HENT:  Head: Normocephalic and atraumatic.  Eyes: EOM are normal.  Neck: Normal range of motion.  Cardiovascular: Normal rate.   Murmur heard. Pulmonary/Chest: Effort normal and breath sounds normal.  Musculoskeletal:       Tenderness to percussion entire LS spine with tenderness to lumbar paraspinal muscles.    ED Course  Procedures (including critical care time)  Patient with chronic back pain here with acute pain he has had for 6 weeks. Last MRI done on 05/25/11 at [facility]. [id number] Spoke with Dr. Zonia Kief, radiologist. He reivewed findings from [facility] MRI; L4-5 with bulging disc that protrudes laterally not  Significantly changed , L1-L2 unchanged, L2-L3, right sided herniated disc. Overall report shows slight worsening of post operative scarring and changes, no acute process. Patient administered analgesic with some releif. Able to walk to the bathroom unassisted. Pt feels improved after observation and/or treatment in ED.Pt stable in ED with no significant deterioration in condition.The patient appears reasonably screened and/or stabilized for discharge and I doubt any other medical condition or other [facility] requiring further screening, evaluation, or treatment in the ED at this  time prior to discharge. MDM Reviewed: nursing note and vitals Reviewed previous: MRI and x-ray Total time providing critical care: 40 minutes. Consults: radiologist.          Nicoletta Dress. Colon Branch, MD 05/31/11 0230

## 2011-05-31 NOTE — ED Notes (Signed)
Back pain, states had mri this week.

## 2011-05-31 NOTE — ED Notes (Signed)
Pt brought to er by ems for severe back pain denies any recent known injury, was seen by pain doctor on Thursday and had mri on Friday for same, is on percocet 15mg  every 5 hours for pain and that is not helping.

## 2011-05-31 NOTE — ED Notes (Signed)
Pt ambulatory at discharge; steady gait noted; pain level decreased from admission

## 2011-05-31 NOTE — ED Notes (Signed)
Pt asked when he would be discharged, asked if deputy would drive him home, rn told pt that law enforcement was not able to transport pts' home.  Pt on cell phone attempting to get ride home--pt lives in [place]

## 2011-06-01 LAB — COMPREHENSIVE METABOLIC PANEL
ALT: 25
AST: 22
CO2: 27
Calcium: 9.1
GFR calc Af Amer: >60
Sodium: 137
Total Protein: 6.9

## 2011-06-01 LAB — CBC
MCHC: 34.2
MCHC: 33.8
MCV: 93.9
Platelets: 255
RBC: 4.06 — ABNORMAL LOW
RBC: 4.3
RDW: 12.4

## 2011-06-01 LAB — DIFFERENTIAL
Eosinophils Absolute: 0.5
Eosinophils Relative: 4
Lymphs Abs: 1.9
Monocytes Relative: 12

## 2011-06-01 LAB — BASIC METABOLIC PANEL
BUN: 15
CO2: 23
Chloride: 102
Creatinine, Ser: 1.18

## 2011-06-02 LAB — BASIC METABOLIC PANEL WITH GFR
CO2: 26
Calcium: 9.5
GFR calc Af Amer: >60
GFR calc non Af Amer: >60
Potassium: 3.5
Sodium: 136

## 2011-06-02 LAB — DIFFERENTIAL
Basophils Absolute: 0.1
Basophils Relative: 1
Eosinophils Absolute: 0.4
Eosinophils Relative: 3
Lymphocytes Relative: 26
Lymphs Abs: 3.4 — ABNORMAL HIGH
Monocytes Absolute: 1.1 — ABNORMAL HIGH
Monocytes Relative: 9
Neutro Abs: 8 — ABNORMAL HIGH
Neutrophils Relative %: 61

## 2011-06-02 LAB — CK TOTAL AND CKMB (NOT AT ARMC)
CK, MB: 1.6
Relative Index: 0.9
Total CK: 183

## 2011-06-02 LAB — PROTIME-INR
INR: 0.9
Prothrombin Time: 12.8

## 2011-06-02 LAB — HEPATIC FUNCTION PANEL
ALT: 25
AST: 22
Alkaline Phosphatase: 50
Bilirubin, Direct: <0.1
Total Protein: 7.5

## 2011-06-02 LAB — POCT CARDIAC MARKERS
CKMB, poc: 1.1
CKMB, poc: 1.3
Myoglobin, poc: 101
Myoglobin, poc: 103
Operator id: 208401
Operator id: 208401
Troponin i, poc: <0.05
Troponin i, poc: <0.05

## 2011-06-02 LAB — CBC
HCT: 39.5
Hemoglobin: 13.7
MCHC: 34.7
MCV: 93.2
Platelets: 265
RBC: 4.23
RDW: 12.5
WBC: 13.1 — ABNORMAL HIGH

## 2011-06-02 LAB — APTT: aPTT: 29

## 2011-06-02 LAB — BASIC METABOLIC PANEL
BUN: 13
Chloride: 103
Creatinine, Ser: 1.1
Glucose, Bld: 131 — ABNORMAL HIGH

## 2011-06-02 LAB — ETHANOL: Alcohol, Ethyl (B): 8

## 2011-06-02 LAB — TROPONIN I: Troponin I: 0.01

## 2011-06-02 LAB — D-DIMER, QUANTITATIVE: D-Dimer, Quant: 0.23

## 2011-06-02 LAB — B-NATRIURETIC PEPTIDE (CONVERTED LAB): Pro B Natriuretic peptide (BNP): <30

## 2011-06-10 LAB — CARDIAC PANEL(CRET KIN+CKTOT+MB+TROPI)
CK, MB: 1.7
CK, MB: 1.7
Relative Index: 0.8
Total CK: 200
Total CK: 223

## 2011-06-10 LAB — PROTIME-INR
INR: 0.9
INR: 1
Prothrombin Time: 12.8

## 2011-06-10 LAB — BASIC METABOLIC PANEL
BUN: 10
CO2: 26
CO2: 26
Calcium: 8.5
Calcium: 9.4
Chloride: 104
GFR calc Af Amer: >60
GFR calc Af Amer: >60
GFR calc non Af Amer: >60
Glucose, Bld: 105 — ABNORMAL HIGH
Potassium: 3.4 — ABNORMAL LOW
Potassium: 3.8
Sodium: 135
Sodium: 136
Sodium: 140

## 2011-06-10 LAB — CBC
HCT: 39
Hemoglobin: 13.7
Hemoglobin: 14
MCHC: 35
MCHC: 35.3
MCV: 90.4
Platelets: 270
RBC: 4.13 — ABNORMAL LOW
RBC: 4.44
RDW: 12.6

## 2011-06-10 LAB — DIFFERENTIAL
Basophils Absolute: 0.1
Basophils Absolute: 0.1
Basophils Relative: 1
Basophils Relative: 1
Eosinophils Absolute: 0.6
Eosinophils Relative: 5
Monocytes Absolute: 1.3 — ABNORMAL HIGH
Monocytes Relative: 13 — ABNORMAL HIGH
Neutro Abs: 3.9
Neutrophils Relative %: 39 — ABNORMAL LOW

## 2011-06-10 LAB — RAPID URINE DRUG SCREEN, HOSP PERFORMED
Barbiturates: NOT DETECTED
Opiates: NOT DETECTED

## 2011-06-10 LAB — POCT CARDIAC MARKERS: Troponin i, poc: <0.05

## 2011-06-29 ENCOUNTER — Ambulatory Visit: Payer: Medicare Other | Admitting: Cardiology

## 2011-07-09 ENCOUNTER — Other Ambulatory Visit: Payer: Self-pay

## 2011-07-09 ENCOUNTER — Emergency Department (HOSPITAL_COMMUNITY): Payer: Medicare Other

## 2011-07-09 ENCOUNTER — Encounter (HOSPITAL_COMMUNITY): Payer: Self-pay

## 2011-07-09 ENCOUNTER — Emergency Department (HOSPITAL_COMMUNITY)
Admission: EM | Admit: 2011-07-09 | Discharge: 2011-07-10 | Disposition: A | Discharge status: Home / Self Care | Payer: Medicare Other | Attending: Emergency Medicine | Admitting: Emergency Medicine

## 2011-07-09 DIAGNOSIS — W19XXXA Unspecified fall, initial encounter: Secondary | ICD-10-CM

## 2011-07-09 DIAGNOSIS — F17200 Nicotine dependence, unspecified, uncomplicated: Secondary | ICD-10-CM | POA: Insufficient documentation

## 2011-07-09 DIAGNOSIS — R0602 Shortness of breath: Secondary | ICD-10-CM | POA: Insufficient documentation

## 2011-07-09 DIAGNOSIS — S7000XA Contusion of unspecified hip, initial encounter: Secondary | ICD-10-CM

## 2011-07-09 DIAGNOSIS — R110 Nausea: Secondary | ICD-10-CM | POA: Insufficient documentation

## 2011-07-09 DIAGNOSIS — M79609 Pain in unspecified limb: Secondary | ICD-10-CM | POA: Insufficient documentation

## 2011-07-09 DIAGNOSIS — Z79899 Other long term (current) drug therapy: Secondary | ICD-10-CM | POA: Insufficient documentation

## 2011-07-09 DIAGNOSIS — R61 Generalized hyperhidrosis: Secondary | ICD-10-CM | POA: Insufficient documentation

## 2011-07-09 DIAGNOSIS — R079 Chest pain, unspecified: Secondary | ICD-10-CM | POA: Insufficient documentation

## 2011-07-09 DIAGNOSIS — M25559 Pain in unspecified hip: Secondary | ICD-10-CM | POA: Insufficient documentation

## 2011-07-09 LAB — BASIC METABOLIC PANEL
BUN: 17 mg/dL (ref 6–23)
Creatinine, Ser: 0.94 mg/dL (ref 0.50–1.35)
GFR calc Af Amer: >90 mL/min (ref >90)
GFR calc non Af Amer: >90 mL/min (ref >90)
Potassium: 3.4 mEq/L — ABNORMAL LOW (ref 3.5–5.1)

## 2011-07-09 LAB — CBC
Hemoglobin: 13.1 g/dL (ref 13.0–17.0)
MCH: 31.8 pg (ref 26.0–34.0)
MCHC: 34.1 g/dL (ref 30.0–36.0)
RDW: 12.3 % (ref 11.5–15.5)

## 2011-07-09 LAB — DIFFERENTIAL
Basophils Absolute: 0.1 10*3/uL (ref 0.0–0.1)
Basophils Relative: 1 % (ref 0–1)
Eosinophils Absolute: 0.5 10*3/uL (ref 0.0–0.7)
Monocytes Relative: 11 % (ref 3–12)
Neutrophils Relative %: 51 % (ref 43–77)

## 2011-07-09 LAB — POCT I-STAT TROPONIN I: Troponin i, poc: 0 ng/mL (ref 0.00–0.08)

## 2011-07-09 MED ORDER — MORPHINE SULFATE 4 MG/ML IJ SOLN
4.0000 mg | Freq: Once | INTRAMUSCULAR | Status: AC
  Administered 2011-07-09: 4 mg via INTRAVENOUS
  Filled 2011-07-09: qty 1

## 2011-07-09 MED ORDER — SODIUM CHLORIDE 0.9 % IV SOLN
Freq: Once | INTRAVENOUS | Status: AC
  Administered 2011-07-09: 21:00:00 via INTRAVENOUS

## 2011-07-09 MED ORDER — MORPHINE SULFATE 4 MG/ML IJ SOLN: 4.0000 mg | Freq: Once | INTRAMUSCULAR | Status: DC

## 2011-07-09 MED ORDER — ALBUTEROL SULFATE (5 MG/ML) 0.5% IN NEBU
5.0000 mg | INHALATION_SOLUTION | Freq: Once | RESPIRATORY_TRACT | Status: AC
  Administered 2011-07-09: 5 mg via RESPIRATORY_TRACT
  Filled 2011-07-09: qty 1

## 2011-07-09 MED ORDER — IPRATROPIUM BROMIDE 0.02 % IN SOLN
0.5000 mg | Freq: Once | RESPIRATORY_TRACT | Status: AC
  Administered 2011-07-09: 0.5 mg via RESPIRATORY_TRACT
  Filled 2011-07-09: qty 2.5

## 2011-07-09 MED ORDER — NITROGLYCERIN 0.4 MG SL SUBL
0.4000 mg | SUBLINGUAL_TABLET | Freq: Once | SUBLINGUAL | Status: AC
  Administered 2011-07-09: 0.4 mg via SUBLINGUAL
  Filled 2011-07-09: qty 25

## 2011-07-09 MED ORDER — OXYCODONE-ACETAMINOPHEN 5-325 MG PO TABS
2.0000 | ORAL_TABLET | Freq: Once | ORAL | Status: AC
  Administered 2011-07-09: 2 via ORAL
  Filled 2011-07-09: qty 2

## 2011-07-09 NOTE — ED Notes (Signed)
Pt requesting pain medication at this time. Pt advised last pain meds have not had enough time to work. Nitro given for cp. Pt requesting something to eat at this time also. Pt informed of the need to wait for ct results.

## 2011-07-09 NOTE — ED Notes (Signed)
Pt walked to restroom & returned to room. Pt stating in a lot of pain. edp notified.

## 2011-07-09 NOTE — ED Notes (Signed)
Patient transported to CT 

## 2011-07-09 NOTE — ED Provider Notes (Signed)
History     CSN: [id number] Arrival date & time: 07/09/2011  8:12 PM   First MD Initiated Contact with Patient 07/09/11 2024      Chief Complaint  Patient presents with  . Chest Pain  . Arm Pain  . Hip Pain  . Leg Pain    (Consider location/radiation/quality/duration/timing/severity/associated sxs/prior treatment) HPI Comments: Patient presenting with left-sided chest pain that radiates into his left arm has been ongoing for the past 2 hours. it is variable in intensity and associated with shortness of breath, nausea and diaphoresis. He states he has had this pain in the past but it's only when the last time was. 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  He states while walking in the kitchen he felt lightheaded and fell landing on his right hip where he is having a lot of pain. He denies any loss of consciousness, neck pain, low back pain. Denies abdominal pain.  He is able to bear weight with difficulty.  The history is provided by the patient.    Past Medical History  Diagnosis Date  . COPD (chronic obstructive pulmonary disease)   . Hypertension   . Chronic back pain   . Anxiety disorder   . Hyperlipidemia   . Depression   . Obesity   . History of alcohol abuse   . History of cocaine abuse     Past Surgical History  Procedure Date  . 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     Family History  Problem Relation Age of Onset  . Coronary artery disease      family h/o  . Heart attack      family h/o    History  Substance Use Topics  . Smoking status: Current Everyday Smoker  . Smokeless tobacco: Not on file  . Alcohol Use: Yes     drinks occasionally      Review of Systems  Cardiovascular: Positive for chest pain.    Allergies  Review of patient's allergies indicates no  known allergies.  Home Medications   Current Outpatient Rx  Name Route Sig Dispense Refill  . ALPRAZOLAM 1 MG PO TABS Oral Take 1 mg by mouth 3 (three) times daily as needed. For anxiety    . AMLODIPINE BESYLATE 5 MG PO TABS Oral Take 5 mg by mouth daily.      . ASPIRIN 81 MG PO TABS Oral Take 81 mg by mouth daily.      . BUDESONIDE-FORMOTEROL FUMARATE 160-4.5 MCG/ACT IN AERO Inhalation Inhale 1 puff into the lungs 2 (two) times daily.      . CELECOXIB 100 MG PO CAPS Oral Take 100 mg by mouth 2 (two) times daily.      Marland Kitchen CITALOPRAM HYDROBROMIDE 40 MG PO TABS Oral Take 40 mg by mouth daily.      Marland Kitchen HYDROCHLOROTHIAZIDE 25 MG PO TABS Oral Take 25 mg by mouth daily. For blood pressure    . ISOSORBIDE MONONITRATE ER 30 MG PO TB24 Oral Take 30 mg by mouth daily.      Marland Kitchen NITROGLYCERIN 0.4 MG SL SUBL Sublingual Place 0.4 mg under the tongue every 5 (five) minutes as needed.      Marland Kitchen OMEPRAZOLE 40 MG PO CPDR Oral Take 40 mg by mouth daily.      . OXYCODONE HCL 15 MG  PO TB12 Oral Take 15 mg by mouth 4 (four) times daily.      Marland Kitchen POTASSIUM CHLORIDE 10 MEQ PO TBCR Oral Take 10 mEq by mouth daily.      . TRAZODONE HCL 100 MG PO TABS Oral Take 100 mg by mouth at bedtime.      . ALBUTEROL SULFATE HFA 108 (90 BASE) MCG/ACT IN AERS Inhalation Inhale 2 puffs into the lungs every 4 (four) hours.     . ALBUTEROL SULFATE (2.5 MG/3ML) 0.083% IN NEBU Nebulization Take 2.5 mg by nebulization every 6 (six) hours as needed. For shortness of breath     . IPRATROPIUM-ALBUTEROL 0.5-2.5 (3) MG/3ML IN SOLN Nebulization Take 3 mLs by nebulization every 6 (six) hours.      Marland Kitchen LISINOPRIL 10 MG PO TABS Oral Take 10 mg by mouth daily.      Marland Kitchen LOVASTATIN 20 MG PO TABS Oral Take 20 mg by mouth daily.       BP 116/58  Pulse 72  Temp(Src) 97.9 F (36.6 C) (Oral)  Resp 20  Ht 6\' 3"  (1.905 m)  Wt 327 lb (148.326 kg)  BMI 40.87 kg/m2  SpO2 97%  Physical Exam  Constitutional: He is oriented to person, place, and time. He appears  well-developed and well-nourished. No distress.  HENT:  Head: Normocephalic and atraumatic.  Mouth/Throat: Oropharynx is clear and moist. No oropharyngeal exudate.  Eyes: Conjunctivae are normal. Pupils are equal, round, and reactive to light.  Neck: Normal range of motion.  Cardiovascular: Normal rate, regular rhythm and normal heart sounds.   Pulmonary/Chest: Effort normal. No respiratory distress. He has wheezes.       Inspiratory and expiratory wheezing bilaterally  Abdominal: Soft. There is no tenderness. There is no rebound and no guarding.  Musculoskeletal: Normal range of motion. He exhibits no edema and no tenderness.       TTP R lateral hip without deformity. No rotational deformity or shortening of limb.  Neurological: He is alert and oriented to person, place, and time. No cranial nerve deficit.  Skin: Skin is warm.    ED Course  Procedures (including critical care time)  Labs Reviewed  CBC - Abnormal; Notable for the following:    RBC 4.12 (*)    HCT 38.4 (*)    All other components within normal limits  DIFFERENTIAL - Abnormal; Notable for the following:    Monocytes Absolute 1.1 (*)    All other components within normal limits  BASIC METABOLIC PANEL - Abnormal; Notable for the following:    Potassium 3.4 (*)    Glucose, Bld 141 (*)    All other components within normal limits  PRO B NATRIURETIC PEPTIDE  PROTIME-INR  POCT I-STAT TROPONIN I  POCT I-STAT TROPONIN I   Dg Chest 1 View  07/09/2011  *RADIOLOGY REPORT*  Clinical Data: Fall.  Left-sided chest pain. COPD.  CHEST - 1 VIEW  Comparison: 02/24/2011  Findings: Heart size is stable.  Pulmonary interstitial prominence is unchanged.  No evidence of acute infiltrate or pleural effusion.  IMPRESSION: Stable exam.  No acute findings.  Original Report Authenticated By: Danae Orleans, M.D.   Dg Hip Complete Right  07/09/2011  *RADIOLOGY REPORT*  Clinical Data: Fall.  Right hip pain.  RIGHT HIP - COMPLETE 2+ VIEW   Comparison: None.  Findings: No evidence of right hip fracture or dislocation.  No evidence of arthropathy or other significant bone abnormality.  A sclerotic lesion is noted in the proximal left  femoral shaft which shows no aggressive radiographic features.  IMPRESSION:  1.  No acute findings. 2.  Sclerotic lesion in the proximal left femoral shaft, with no aggressive radiographic features.  If the patient has no previous history of cancer, this is almost certainly benign.  If the patient does have history of cancer, nuclear medicine whole body bone scan is recommended for further evaluation.  Original Report Authenticated By: Danae Orleans, M.D.   Ct Pelvis Wo Contrast  07/09/2011  *RADIOLOGY REPORT*  Clinical Data:  Severe right hip pain.  Negative hip radiographs. Evaluate for occult fracture.  CT PELVIS WITHOUT CONTRAST  Technique:  Multidetector CT imaging of the pelvis was performed following the standard protocol without intravenous contrast.  Comparison:  Right hip radiographs also obtained today.  Findings:  No evidence of acute fracture or dislocation.  Chronic avascular necrosis is seen involving both femoral heads. Mild right hip degenerative spurring noted without significant joint space narrowing. In the proximal left femoral shaft, there is a lucent lesion with thick sclerotic margin which shows no aggressive radiographic features, which is likely benign.  IMPRESSION:  1.  No evidence of hip fracture or dislocation. 2.  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  Original Report Authenticated By: Danae Orleans, M.D.     1. Fall   2. Chest pain   3. Contusion, hip       MDM  Chest pain, associated with nausea, lightheadedness, dizziness, SOB, in setting of recent negative LHC.  Also R hip pain after fall.  First set of cardiac enzymes negative, patient's chest pain persists. He also continues to complain of right hip pain despite negative x-rays. We'll attempt  to ambulate patient.  His wheezing has improved after the breathing treatment.  D/w Dr. Antoine Poche of cardiology given patient's recent LHC which showed nonobstructive disease.  Agrees no further provocative testing indicated.  Imaging of the hip is negative for occult fracture.  Patient is able to ambulate in the hallways without assistance.   Second set of enzymes pending, patient signed out to Dr. Lynelle Doctor.   Date: 07/09/2011  Rate: 78  Rhythm: normal sinus rhythm  QRS Axis: normal  Intervals: normal  ST/T Wave abnormalities: normal  Conduction Disutrbances:right bundle branch block  Narrative Interpretation:   Old EKG Reviewed: unchanged    Glynn Octave, MD 07/10/11 1005

## 2011-07-09 NOTE — ED Notes (Signed)
Pt state while he was sitting down he started having left sided chest pain that radiates to left arm. Pt state he fell when trying to stand and now is also having right hip and leg pain.

## 2011-07-09 NOTE — ED Notes (Signed)
Pt woke from sleep w/ left sided chest pain. Went to get up & fell on hardwood floors on right side of hip.

## 2011-07-10 NOTE — ED Notes (Signed)
This nurse carried paperwork outside to pt. Pt on cellphone when given paperwork. Was advised by security pt calling 911 for a ride.

## 2011-07-10 NOTE — ED Provider Notes (Signed)
Pt states his primary is Dr Olena Leatherwood in [place]. Pt had chest pain with negative cardiac cath in July 2012. His cardiac enzymes are negative. He states he fell and hurt his hip with negative scans. Nurses note when told he was going home he walked to the nurses station on his own ready to go home. Pt given his second troponin results and reviewed that his scans did not show a fracture.   [name] Givens, MD 07/10/11 [phone]

## 2011-07-10 NOTE — ED Notes (Signed)
Pt not wanting to wait for discharge paperwork. Pt signed discharge & walked to the front. Pt informed this nurse would bring papers to him when returned by the edp.

## 2011-08-17 ENCOUNTER — Encounter (HOSPITAL_COMMUNITY): Payer: Self-pay | Admitting: *Deleted

## 2011-08-17 ENCOUNTER — Observation Stay (HOSPITAL_COMMUNITY)
Admission: EM | Admit: 2011-08-17 | Discharge: 2011-08-18 | Discharge status: Against Medical Advice | Payer: Medicare Other | Attending: Emergency Medicine | Admitting: Emergency Medicine

## 2011-08-17 DIAGNOSIS — J4489 Other specified chronic obstructive pulmonary disease: Secondary | ICD-10-CM | POA: Insufficient documentation

## 2011-08-17 DIAGNOSIS — J449 Chronic obstructive pulmonary disease, unspecified: Secondary | ICD-10-CM | POA: Insufficient documentation

## 2011-08-17 DIAGNOSIS — M25551 Pain in right hip: Secondary | ICD-10-CM

## 2011-08-17 DIAGNOSIS — R0789 Other chest pain: Secondary | ICD-10-CM

## 2011-08-17 DIAGNOSIS — E669 Obesity, unspecified: Secondary | ICD-10-CM | POA: Insufficient documentation

## 2011-08-17 DIAGNOSIS — G8929 Other chronic pain: Secondary | ICD-10-CM | POA: Insufficient documentation

## 2011-08-17 DIAGNOSIS — F3289 Other specified depressive episodes: Secondary | ICD-10-CM | POA: Insufficient documentation

## 2011-08-17 DIAGNOSIS — R0989 Other specified symptoms and signs involving the circulatory and respiratory systems: Secondary | ICD-10-CM | POA: Insufficient documentation

## 2011-08-17 DIAGNOSIS — E785 Hyperlipidemia, unspecified: Secondary | ICD-10-CM | POA: Insufficient documentation

## 2011-08-17 DIAGNOSIS — I10 Essential (primary) hypertension: Secondary | ICD-10-CM | POA: Insufficient documentation

## 2011-08-17 DIAGNOSIS — M87059 Idiopathic aseptic necrosis of unspecified femur: Secondary | ICD-10-CM | POA: Insufficient documentation

## 2011-08-17 DIAGNOSIS — M169 Osteoarthritis of hip, unspecified: Secondary | ICD-10-CM | POA: Insufficient documentation

## 2011-08-17 DIAGNOSIS — M1610 Unilateral primary osteoarthritis, unspecified hip: Secondary | ICD-10-CM | POA: Insufficient documentation

## 2011-08-17 DIAGNOSIS — F411 Generalized anxiety disorder: Secondary | ICD-10-CM | POA: Insufficient documentation

## 2011-08-17 DIAGNOSIS — F329 Major depressive disorder, single episode, unspecified: Secondary | ICD-10-CM | POA: Insufficient documentation

## 2011-08-17 DIAGNOSIS — R0609 Other forms of dyspnea: Secondary | ICD-10-CM | POA: Insufficient documentation

## 2011-08-17 DIAGNOSIS — R079 Chest pain, unspecified: Principal | ICD-10-CM | POA: Insufficient documentation

## 2011-08-17 NOTE — ED Notes (Addendum)
Per EMS: pt normally walks with a walker due to right  hip and knee pain. Pt states that his mother fell this afternoon and he picked her up from the floor and he felt a sudden sharp pain in his chest. Pain is left sided and radiates down his left arm.pt received 324mg  of aspirin, 3SL nitro and 4mg  of morphine. Pt states pain barely relieved with meds. 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 Pt suppose to see specialist for hip and knee pain tomorrow.

## 2011-08-18 ENCOUNTER — Emergency Department (HOSPITAL_COMMUNITY): Payer: Medicare Other

## 2011-08-18 LAB — CBC
HCT: 40.8 % (ref 39.0–52.0)
Hemoglobin: 14.3 g/dL (ref 13.0–17.0)
MCHC: 35 g/dL (ref 30.0–36.0)
MCV: 92.9 fL (ref 78.0–100.0)
WBC: 13.8 10*3/uL — ABNORMAL HIGH (ref 4.0–10.5)

## 2011-08-18 LAB — POCT I-STAT TROPONIN I: Troponin i, poc: 0 ng/mL (ref 0.00–0.08)

## 2011-08-18 LAB — BASIC METABOLIC PANEL
BUN: 18 mg/dL (ref 6–23)
CO2: 23 mEq/L (ref 19–32)
Chloride: 99 mEq/L (ref 96–112)
Creatinine, Ser: 1.03 mg/dL (ref 0.50–1.35)

## 2011-08-18 LAB — DIFFERENTIAL
Eosinophils Relative: 3 % (ref 0–5)
Lymphocytes Relative: 26 % (ref 12–46)
Monocytes Absolute: 1.5 10*3/uL — ABNORMAL HIGH (ref 0.1–1.0)
Monocytes Relative: 11 % (ref 3–12)
Neutro Abs: 8.2 10*3/uL — ABNORMAL HIGH (ref 1.7–7.7)

## 2011-08-18 MED ORDER — SODIUM CHLORIDE 0.9 % IV SOLN: INTRAVENOUS | Status: DC

## 2011-08-18 MED ORDER — METOPROLOL TARTRATE 25 MG PO TABS
ORAL_TABLET | ORAL | Status: AC
  Administered 2011-08-18: 50 mg
  Filled 2011-08-18: qty 2

## 2011-08-18 MED ORDER — OXYCODONE-ACETAMINOPHEN 5-325 MG PO TABS
2.0000 | ORAL_TABLET | Freq: Once | ORAL | Status: AC
  Administered 2011-08-18: 2 via ORAL
  Filled 2011-08-18: qty 1

## 2011-08-18 MED ORDER — FENTANYL CITRATE 0.05 MG/ML IJ SOLN
100.0000 ug | Freq: Once | INTRAMUSCULAR | Status: AC
  Administered 2011-08-18: 100 ug via INTRAMUSCULAR
  Filled 2011-08-18: qty 2

## 2011-08-18 NOTE — ED Notes (Signed)
Pt requested to see Clarene Duke

## 2011-08-18 NOTE — ED Notes (Signed)
Pt requested that I prop his leg up; pt denies being able to ambulate but is able to move up and down in the bed unassisted. Pt is requesting additional pain medications.

## 2011-08-18 NOTE — ED Provider Notes (Signed)
History     CSN: [id number]  Arrival date & time 08/17/11  2338   Chief Complaint  Patient presents with  . Chest Pain  . Hip Pain    HPI Pt was seen at 2350.  Per pt, c/o gradual onset and persistence of constant acute flair of his chronic right hip "pain" x2 months.  States he has been walking with a walker due to his pain and is due to see "a specialist" for his pain tomorrow.  Denies new injury.  Also c/o sudden onset and resolution of one episode of chest pain that began this afternoon.  Describes the pain as "sharp" and radiates down his left arm.  States the pain began when he bent over to pick his mother up off the floor after she fell.  Pt was given ASA, ntg and morphine in route with improvement. States he has been having intermittent episodes of chest pain for the past 3 days, lasting "several hours" each episode, and being relieved with his own SL ntg.  Denies SOB/cough, no abd pain, no N/V/D.    Cards:  [place] Dr. Antoine Poche, Dr. Andee Lineman PMD:  Dr. Olena Leatherwood Past Medical History  Diagnosis Date  . COPD (chronic obstructive pulmonary disease)   . Hypertension   . Chronic back pain   . Anxiety disorder   . Hyperlipidemia   . Depression   . Obesity   . History of alcohol abuse   . History of cocaine abuse     Past Surgical History  Procedure Date  . 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     Family History  Problem Relation Age of Onset  . Coronary artery disease      family h/o  . Heart attack      family h/o    History  Substance Use Topics  . Smoking status: Current Everyday Smoker  . Smokeless tobacco: Not on file  . Alcohol Use: Yes     drinks occasionally    Review of Systems ROS: Statement: All systems negative except as marked or noted in the HPI; Constitutional: Negative for fever and chills. ; ; Eyes: Negative for eye pain,  redness and discharge. ; ; ENMT: Negative for ear pain, hoarseness, nasal congestion, sinus pressure and sore throat. ; ; Cardiovascular: +CP. Negative for palpitations, diaphoresis, dyspnea and peripheral edema. ; ; Respiratory: Negative for cough, wheezing and stridor. ; ; Gastrointestinal: Negative for nausea, vomiting, diarrhea, abdominal pain, blood in stool, hematemesis, jaundice and rectal bleeding.; ; Genitourinary: Negative for dysuria, flank pain and hematuria. ; ; Musculoskeletal: +right hip pain.  Negative for back pain and neck pain. Negative for swelling and trauma.; ; Skin: Negative for pruritus, rash, abrasions, blisters, bruising and skin lesion.; ; Neuro: Negative for headache, lightheadedness and neck stiffness. Negative for weakness, altered level of consciousness , altered mental status, extremity weakness, paresthesias, involuntary movement, seizure and syncope.     Allergies  Review of patient's allergies indicates no known allergies.  Home Medications   Current Outpatient Rx  Name Route Sig Dispense Refill  . ALBUTEROL SULFATE HFA 108 (90 BASE) MCG/ACT IN AERS Inhalation Inhale 2 puffs into the lungs every 4 (four) hours.     . ALBUTEROL SULFATE (2.5 MG/3ML) 0.083% IN NEBU Nebulization Take 2.5 mg by nebulization every 6 (six) hours as needed. For shortness of breath     .  ALPRAZOLAM 1 MG PO TABS Oral Take 1 mg by mouth 3 (three) times daily as needed. For anxiety    . AMLODIPINE BESYLATE 5 MG PO TABS Oral Take 5 mg by mouth daily.      . ASPIRIN 81 MG PO TABS Oral Take 81 mg by mouth daily.      . BUDESONIDE-FORMOTEROL FUMARATE 160-4.5 MCG/ACT IN AERO Inhalation Inhale 1 puff into the lungs 2 (two) times daily.      . CELECOXIB 100 MG PO CAPS Oral Take 100 mg by mouth 2 (two) times daily.      Marland Kitchen CITALOPRAM HYDROBROMIDE 40 MG PO TABS Oral Take 40 mg by mouth daily.      . COLCHICINE 0.6 MG PO TABS Oral Take 0.6 mg by mouth 2 (two) times daily.      Marland Kitchen HYDROCHLOROTHIAZIDE 25  MG PO TABS Oral Take 25 mg by mouth daily. For blood pressure    . IPRATROPIUM-ALBUTEROL 0.5-2.5 (3) MG/3ML IN SOLN Nebulization Take 3 mLs by nebulization every 6 (six) hours.      . ISOSORBIDE MONONITRATE ER 30 MG PO TB24 Oral Take 30 mg by mouth daily.      Marland Kitchen LISINOPRIL 10 MG PO TABS Oral Take 10 mg by mouth daily.      Marland Kitchen LOVASTATIN 20 MG PO TABS Oral Take 20 mg by mouth daily.     Marland Kitchen NITROGLYCERIN 0.4 MG SL SUBL Sublingual Place 0.4 mg under the tongue every 5 (five) minutes as needed.      Marland Kitchen OMEPRAZOLE 40 MG PO CPDR Oral Take 40 mg by mouth daily.      . OXYCODONE HCL ER 15 MG PO TB12 Oral Take 15 mg by mouth 4 (four) times daily.      . OXYCODONE-ACETAMINOPHEN 10-325 MG PO TABS Oral Take 1 tablet by mouth every 4 (four) hours as needed. For pain     . POTASSIUM CHLORIDE 10 MEQ PO TBCR Oral Take 10 mEq by mouth daily.      . TRAZODONE HCL 100 MG PO TABS Oral Take 100 mg by mouth at bedtime.        BP 102/51  Pulse 63  Temp(Src) 98.2 F (36.8 C) (Oral)  Resp 17  SpO2 97%  Physical Exam 0005: Physical examination:  Nursing notes reviewed; Vital signs and O2 SAT reviewed;  Constitutional: Well developed, Well nourished, Well hydrated, In no acute distress; Head:  Normocephalic, atraumatic; Eyes: EOMI, PERRL, No scleral icterus; ENMT: Mouth and pharynx normal, Mucous membranes moist; Neck: Supple, Full range of motion, No lymphadenopathy; Cardiovascular: Regular rate and rhythm, No murmur, rub, or gallop; Respiratory: Breath sounds clear & equal bilaterally, No rales, rhonchi, wheezes, or rub, Normal respiratory effort/excursion; Chest: +parasternal and anterior chest wall TTP, Movement normal; Abdomen: Soft, Nontender, Nondistended, Normal bowel sounds; Extremities: Pulses normal, +right hip TTP, No edema, No calf edema or asymmetry.; Neuro: AA&Ox3, Major CN grossly intact.  No gross focal motor or sensory deficits in extremities.; Skin: Color normal, Warm, Dry, no rash.    ED Course    Procedures   0030:  T/C to Cards MD [place], case discussed, including:  HPI, pertinent PM/SHx, VS/PE, dx testing, ED course and prehospital treatment (aware awaiting ED CXR and labs), also discussed EPIC chart review notes from Dr. Antoine Poche to Dr. Andee Lineman (below), as well as cardiac cath (below).  Pt has been to ED multiple times for CP since cardiac cath, has not obtained cardiac CTA; Cards MD feels pt  can be placed on CDU CP obs protocol and have cardiac CTA in the morning.       Message copied by Murriel Hopper on Mon Mar 08, 2011 4:13 PM  ------  Message from: Lewayne Bunting E  Created: Mon Mar 08, 2011 11:39 AM  Patient will need cardiac CTA. I can discuss this if he has an early appointment if not he should be set up earlier. He is to be done at [facility].  ----- Message -----  From: Rollene Rotunda, MD  Sent: 02/25/2011 12:11 PM  To: Peyton Bottoms, MD  Michelle Piper, This patient was to have an outpatient cath but came in with chest pain. No objective evidence of CAD and no obstructive CAD. 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 The course of this was never clarified. I will leave it to you to decide on CT if he has continued symptoms. Thanks. Leta Jungling  02/25/2011 Cardiac Cath: PLAN: The patient will continue to be managed medically for primary risk reduction. 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 If he continues to have symptoms, we could consider CT angiography to  evaluate the course of the left main though I would not strongly suspect this to be causing his symptoms. This has not yet been clarified.  Rollene Rotunda, MD, [facility]  Previous CT scan right hip:   Ct Pelvis Wo Contrast  07/09/2011 *RADIOLOGY REPORT* Clinical Data: Severe right hip pain. Negative hip radiographs. Evaluate for occult fracture. CT PELVIS WITHOUT CONTRAST Technique: Multidetector CT  imaging of the pelvis was performed following the standard protocol without intravenous contrast. Comparison: Right hip radiographs also obtained today. Findings: No evidence of acute fracture or dislocation. Chronic avascular necrosis is seen involving both femoral heads. Mild right hip degenerative spurring noted without significant joint space narrowing. In the proximal left femoral shaft, there is a lucent lesion with thick sclerotic margin which shows no aggressive radiographic features, which is likely benign. IMPRESSION: 1. No evidence of hip fracture or dislocation. 2. 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 Original Report Authenticated By: Danae Orleans, M.D.    [phone]:  2nd troponin negative.  EKG without acute STTW changes.  Continues without CP while in ED.  Only c/o chronic right hip pain, and asking staff multiple times for narcotic pain meds.  Observed to spontaneously move RLE/hip well.  Has had CT of right hip as above, will not re-image today given no new trauma.  Will give IM fentanyl.  Will place on CDU CP protocol for cardiac CTA this morning.  Pt agreeable.    MDM  MDM Reviewed: previous chart, nursing note and vitals Reviewed previous: ECG and CT scan Interpretation: ECG, labs and x-ray    Date: 08/18/2011  Rate: 75  Rhythm: normal sinus rhythm  QRS Axis: normal  Intervals: normal  ST/T Wave abnormalities: normal  Conduction Disutrbances:none  Narrative Interpretation:   Old EKG Reviewed: unchanged; no significant changes from previous EKG dated 07/09/2011.   Results for orders placed during the hospital encounter of 08/17/11  CBC      Component Value Range   WBC 13.8 (*) 4.0 - 10.5 (K/uL)   RBC 4.39  4.22 - 5.81 (MIL/uL)   Hemoglobin 14.3  13.0 - 17.0 (g/dL)   HCT 96.0  45.4 - 09.8 (%)   MCV 92.9  78.0 -  100.0 (fL)   MCH 32.6  26.0 - 34.0 (pg)   MCHC 35.0  30.0 - 36.0 (g/dL)   RDW 16.1  09.6 - 04.5 (%)   Platelets 234  150 - 400  (K/uL)  DIFFERENTIAL      Component Value Range   Neutrophils Relative 59  43 - 77 (%)   Neutro Abs 8.2 (*) 1.7 - 7.7 (K/uL)   Lymphocytes Relative 26  12 - 46 (%)   Lymphs Abs 3.6  0.7 - 4.0 (K/uL)   Monocytes Relative 11  3 - 12 (%)   Monocytes Absolute 1.5 (*) 0.1 - 1.0 (K/uL)   Eosinophils Relative 3  0 - 5 (%)   Eosinophils Absolute 0.4  0.0 - 0.7 (K/uL)   Basophils Relative 1  0 - 1 (%)   Basophils Absolute 0.1  0.0 - 0.1 (K/uL)  BASIC METABOLIC PANEL      Component Value Range   Sodium 136  135 - 145 (mEq/L)   Potassium 3.6  3.5 - 5.1 (mEq/L)   Chloride 99  96 - 112 (mEq/L)   CO2 23  19 - 32 (mEq/L)   Glucose, Bld 116 (*) 70 - 99 (mg/dL)   BUN 18  6 - 23 (mg/dL)   Creatinine, Ser 4.09  0.50 - 1.35 (mg/dL)   Calcium 9.4  8.4 - 81.1 (mg/dL)   GFR calc non Af Amer 81 (*) >90 (mL/min)   GFR calc Af Amer >90  >90 (mL/min)  POCT I-STAT TROPONIN I      Component Value Range   Troponin i, poc 0.01  0.00 - 0.08 (ng/mL)   Comment 3           PRO B NATRIURETIC PEPTIDE      Component Value Range   Pro B Natriuretic peptide (BNP) 12.5  0 - 125 (pg/mL)  POCT I-STAT TROPONIN I      Component Value Range   Troponin i, poc 0.00  0.00 - 0.08 (ng/mL)   Comment 3              Dg Chest Port 1 View 08/18/2011  *RADIOLOGY REPORT*  Clinical Data: Sudden onset of left-sided chest pain.  PORTABLE CHEST - 1 VIEW  Comparison: Chest radiograph performed 07/09/2011  Findings: The lungs are well-aerated.  Vascular congestion is noted, with bilateral central and bibasilar airspace opacities, concerning for pulmonary edema.  No pleural effusion or pneumothorax is seen.  The cardiomediastinal silhouette is borderline normal in size.  No acute osseous abnormalities are seen.  IMPRESSION: Vascular congestion, with bilateral central and bibasilar airspace opacities, concerning for pulmonary edema.  Original Report Authenticated By: Tonia Ghent, M.D.     [name] [address] DO 08/18/11 1953

## 2011-08-18 NOTE — ED Notes (Signed)
Pt provided w/ urinal.

## 2011-10-04 ENCOUNTER — Emergency Department (HOSPITAL_COMMUNITY)
Admission: EM | Admit: 2011-10-04 | Discharge: 2011-10-04 | Disposition: A | Discharge status: Home / Self Care | Payer: Medicare Other | Attending: Emergency Medicine | Admitting: Emergency Medicine

## 2011-10-04 ENCOUNTER — Encounter (HOSPITAL_COMMUNITY): Payer: Self-pay | Admitting: *Deleted

## 2011-10-04 DIAGNOSIS — E785 Hyperlipidemia, unspecified: Secondary | ICD-10-CM | POA: Insufficient documentation

## 2011-10-04 DIAGNOSIS — I10 Essential (primary) hypertension: Secondary | ICD-10-CM | POA: Insufficient documentation

## 2011-10-04 DIAGNOSIS — J4489 Other specified chronic obstructive pulmonary disease: Secondary | ICD-10-CM | POA: Insufficient documentation

## 2011-10-04 DIAGNOSIS — J449 Chronic obstructive pulmonary disease, unspecified: Secondary | ICD-10-CM | POA: Insufficient documentation

## 2011-10-04 DIAGNOSIS — M25559 Pain in unspecified hip: Secondary | ICD-10-CM | POA: Insufficient documentation

## 2011-10-04 DIAGNOSIS — M25551 Pain in right hip: Secondary | ICD-10-CM

## 2011-10-04 DIAGNOSIS — Z79899 Other long term (current) drug therapy: Secondary | ICD-10-CM | POA: Insufficient documentation

## 2011-10-04 DIAGNOSIS — F341 Dysthymic disorder: Secondary | ICD-10-CM | POA: Insufficient documentation

## 2011-10-04 MED ORDER — ONDANSETRON HCL 4 MG PO TABS
4.0000 mg | ORAL_TABLET | Freq: Once | ORAL | Status: AC
  Administered 2011-10-04: 4 mg via ORAL
  Filled 2011-10-04: qty 1

## 2011-10-04 MED ORDER — OXYCODONE-ACETAMINOPHEN 5-325 MG PO TABS: 2.0000 | ORAL_TABLET | Freq: Four times a day (QID) | ORAL | Status: AC | PRN

## 2011-10-04 MED ORDER — HYDROMORPHONE HCL PF 2 MG/ML IJ SOLN
INTRAMUSCULAR | Status: AC
  Administered 2011-10-04: 22:00:00
  Filled 2011-10-04: qty 1

## 2011-10-04 MED ORDER — HYDROMORPHONE HCL PF 1 MG/ML IJ SOLN: 2.0000 mg | Freq: Once | INTRAMUSCULAR | Status: DC

## 2011-10-04 NOTE — ED Notes (Signed)
Pt reports he was scheduled for rt hip surgery this am but his MD was sick and surgery was cancelled, pt now reports he is out of his pain meds and is in severe pain

## 2011-10-04 NOTE — Discharge Instructions (Signed)
Take your pain medicine as needed.  Follow-up with your doctor 

## 2011-10-04 NOTE — ED Notes (Addendum)
Pt reports that he was scheduled to have right hip surgery today at [facility] but his MD has the flu. Pt reports that he ran out of pain medication since he was suppose to have surgery. Pt states that pain is unbearable so he decided to come to the ER. Pt reports pain 10/10 at this time.

## 2011-10-04 NOTE — ED Provider Notes (Signed)
History    This chart was scribed for [employer]. Colon Branch, MD, MD by Smitty Pluck. The patient was seen in room APA7 and the patient's care was started at 9:21PM.   CSN: [id number]  Arrival date & time 10/04/11  1932   First MD Initiated Contact with Patient 10/04/11 2053      Chief Complaint  Patient presents with  . Hip Pain    (Consider location/radiation/quality/duration/timing/severity/associated sxs/prior treatment) Patient is a 56 y.o. male presenting with hip pain. The history is provided by the patient.  Hip Pain   [name] is a 56 y.o. male who presents to the Emergency Department complaining of severe hip pain.The pain has been constant. History of avascular necrosis of both hips. Pt reports that he was scheduled for hip surgery (right hip) today at [facility] but it was canceled due to MD being sick. Pt was using percocet for pain with relief but ran out of medication last night.  PCP is [name]. Camelia Eng  Past Medical History  Diagnosis Date  . COPD (chronic obstructive pulmonary disease)   . Hypertension   . Chronic back pain   . Anxiety disorder   . Hyperlipidemia   . Depression   . Obesity   . History of alcohol abuse   . History of cocaine abuse     Past Surgical History  Procedure Date  . 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     Family History  Problem Relation Age of Onset  . Coronary artery disease      family h/o  . Heart attack      family h/o    History  Substance Use Topics  . Smoking status: Current Everyday Smoker  . Smokeless tobacco: Not on file  . Alcohol Use: Yes     drinks occasionally      Review of Systems  All other systems reviewed and are negative.   10 Systems reviewed and are negative for acute change except as noted in the HPI.  Allergies  Review of patient's allergies indicates no known  allergies.  Home Medications   Current Outpatient Rx  Name Route Sig Dispense Refill  . ALBUTEROL SULFATE HFA 108 (90 BASE) MCG/ACT IN AERS Inhalation Inhale 2 puffs into the lungs every 4 (four) hours.     . ALBUTEROL SULFATE (2.5 MG/3ML) 0.083% IN NEBU Nebulization Take 2.5 mg by nebulization every 6 (six) hours as needed. For shortness of breath     . ALPRAZOLAM 1 MG PO TABS Oral Take 1 mg by mouth 3 (three) times daily as needed. For anxiety    . AMLODIPINE BESYLATE 5 MG PO TABS Oral Take 5 mg by mouth daily.      . ASPIRIN 81 MG PO TABS Oral Take 81 mg by mouth daily.      . BUDESONIDE-FORMOTEROL FUMARATE 160-4.5 MCG/ACT IN AERO Inhalation Inhale 1 puff into the lungs 2 (two) times daily.      . CELECOXIB 100 MG PO CAPS Oral Take 100 mg by mouth 2 (two) times daily.      Marland Kitchen CITALOPRAM HYDROBROMIDE 40 MG PO TABS Oral Take 40 mg by mouth daily.      . COLCHICINE 0.6 MG PO TABS Oral Take 0.6 mg by mouth 2 (two) times daily.      Marland Kitchen HYDROCHLOROTHIAZIDE 25 MG PO TABS Oral Take 25 mg  by mouth daily. For blood pressure    . IPRATROPIUM-ALBUTEROL 0.5-2.5 (3) MG/3ML IN SOLN Nebulization Take 3 mLs by nebulization every 6 (six) hours.      . ISOSORBIDE MONONITRATE ER 30 MG PO TB24 Oral Take 30 mg by mouth daily.      Marland Kitchen LISINOPRIL 10 MG PO TABS Oral Take 10 mg by mouth daily.      Marland Kitchen LOVASTATIN 20 MG PO TABS Oral Take 20 mg by mouth daily.     Marland Kitchen NITROGLYCERIN 0.4 MG SL SUBL Sublingual Place 0.4 mg under the tongue every 5 (five) minutes as needed.      Marland Kitchen OMEPRAZOLE 40 MG PO CPDR Oral Take 40 mg by mouth daily.      . OXYCODONE HCL ER 15 MG PO TB12 Oral Take 15 mg by mouth 4 (four) times daily.      . OXYCODONE-ACETAMINOPHEN 10-325 MG PO TABS Oral Take 1 tablet by mouth every 4 (four) hours as needed. For pain     . POTASSIUM CHLORIDE 10 MEQ PO TBCR Oral Take 10 mEq by mouth daily.      . TRAZODONE HCL 100 MG PO TABS Oral Take 100 mg by mouth at bedtime.        BP 127/79  Pulse 81  Temp(Src) 98.4  F (36.9 C) (Oral)  Resp 20  Ht 6\' 3"  (1.905 m)  Wt 327 lb (148.326 kg)  BMI 40.87 kg/m2  SpO2 98%  Physical Exam  Nursing note and vitals reviewed. Constitutional: He is oriented to person, place, and time. He appears well-developed and well-nourished. No distress.       Uncomfortable   HENT:  Head: Normocephalic and atraumatic.  Right Ear: External ear normal.  Left Ear: External ear normal.  Eyes: EOM are normal. Pupils are equal, round, and reactive to light.  Neck: Normal range of motion. Neck supple.  Cardiovascular: Normal rate, regular rhythm and normal heart sounds.   Pulmonary/Chest: Effort normal and breath sounds normal. No respiratory distress.  Abdominal: Soft. Bowel sounds are normal. He exhibits no distension. There is no tenderness.  Musculoskeletal:       Limited range of motion at either hip without pain.FROM at knees, ankles. Pulses 2+.  Neurological: He is alert and oriented to person, place, and time.  Skin: Skin is warm and dry.  Psychiatric: He has a normal mood and affect. His behavior is normal.    ED Course  Procedures (including critical care time)  DIAGNOSTIC STUDIES: Oxygen Saturation is 98% on room air, normal by my interpretation.    COORDINATION OF CARE:  9:33PM EDP ordered medication: Dilaudid 2 mg/ml, Zofran 4 mg  MDM  Patient with significant hip pain due to avascular necrosis scheduled for replacements, here with pain and out of medications. Given  analgesics with improvement. Patient to follow up with his orthopedist at [facility].Pt stable in ED with no significant deterioration in condition.The patient appears reasonably screened and/or stabilized for discharge and I doubt any other medical condition or other [facility] requiring further screening, evaluation, or treatment in the ED at this time prior to discharge.  I personally performed the services described in this documentation, which was scribed in my presence. The recorded information has  been reviewed and considered.  MDM Reviewed: nursing note and vitals Reviewed previous: MRI and CT scan         [name] Dress. Colon Branch, MD 10/05/11 2225

## 2011-11-29 ENCOUNTER — Emergency Department (HOSPITAL_COMMUNITY): Payer: Medicare Other

## 2011-11-29 ENCOUNTER — Encounter (HOSPITAL_COMMUNITY): Payer: Self-pay | Admitting: *Deleted

## 2011-11-29 ENCOUNTER — Emergency Department (HOSPITAL_COMMUNITY)
Admission: EM | Admit: 2011-11-29 | Discharge: 2011-11-29 | Discharge status: Against Medical Advice | Payer: Medicare Other | Attending: Emergency Medicine | Admitting: Emergency Medicine

## 2011-11-29 DIAGNOSIS — R0602 Shortness of breath: Secondary | ICD-10-CM | POA: Insufficient documentation

## 2011-11-29 DIAGNOSIS — R079 Chest pain, unspecified: Secondary | ICD-10-CM | POA: Insufficient documentation

## 2011-11-29 DIAGNOSIS — I517 Cardiomegaly: Secondary | ICD-10-CM | POA: Insufficient documentation

## 2011-11-29 HISTORY — DX: Heart failure, unspecified: I50.9

## 2011-11-29 LAB — CBC
HCT: 37.2 % — ABNORMAL LOW (ref 39.0–52.0)
MCV: 94.7 fL (ref 78.0–100.0)
Platelets: 304 10*3/uL (ref 150–400)
RBC: 3.93 MIL/uL — ABNORMAL LOW (ref 4.22–5.81)
WBC: 11.3 10*3/uL — ABNORMAL HIGH (ref 4.0–10.5)

## 2011-11-29 LAB — BASIC METABOLIC PANEL
CO2: 26 mEq/L (ref 19–32)
Chloride: 100 mEq/L (ref 96–112)
Creatinine, Ser: 0.96 mg/dL (ref 0.50–1.35)
GFR calc Af Amer: >90 mL/min (ref >90)
Potassium: 3.8 mEq/L (ref 3.5–5.1)

## 2011-11-29 LAB — PRO B NATRIURETIC PEPTIDE: Pro B Natriuretic peptide (BNP): 19.7 pg/mL (ref 0–125)

## 2011-11-29 NOTE — ED Notes (Signed)
Patient states - take all this off of me - I am leaving - I am hurting to much to stay here.  Encouraged to stay, refuses.  States he does not wish to stay - advised if he is having a heart attach he could walk out and die - he states he understands the risk

## 2011-11-29 NOTE — ED Notes (Signed)
Charge nurse in to speak to patient.  MD aware patient is leaving.  Assisted out via wheelchair.

## 2011-11-29 NOTE — ED Notes (Signed)
Discussed ramifications of leaving AMA.  Pt verbalized awareness of risk and states "I'll just chance it."  Reinforced risk and encouraged to stay.  Pt refused.

## 2011-11-29 NOTE — ED Notes (Addendum)
Pt presents to department with SOB. States that SOB began about 1230. Also reports that he has been having some chest pain in left side, into left shoulder.  Pt states he believes that chest pain is muscle pain due to having to pull himself up since he has had recent hip surgery.  Pt reports pain in hip as well.  States CP began when he woke up at 1230. Pt reports he did use 2 SL nitro at about 2:30 and did have relief from pain, but that it has returned.

## 2012-01-10 IMAGING — CR DG CHEST 1V PORT
1 series · 1 of 1 positions shown · non-contrast
Comparison: Chest radiograph performed 07/09/2011

CLINICAL DATA: Sudden onset of left-sided chest pain.

PORTABLE CHEST - 1 VIEW

[AP]
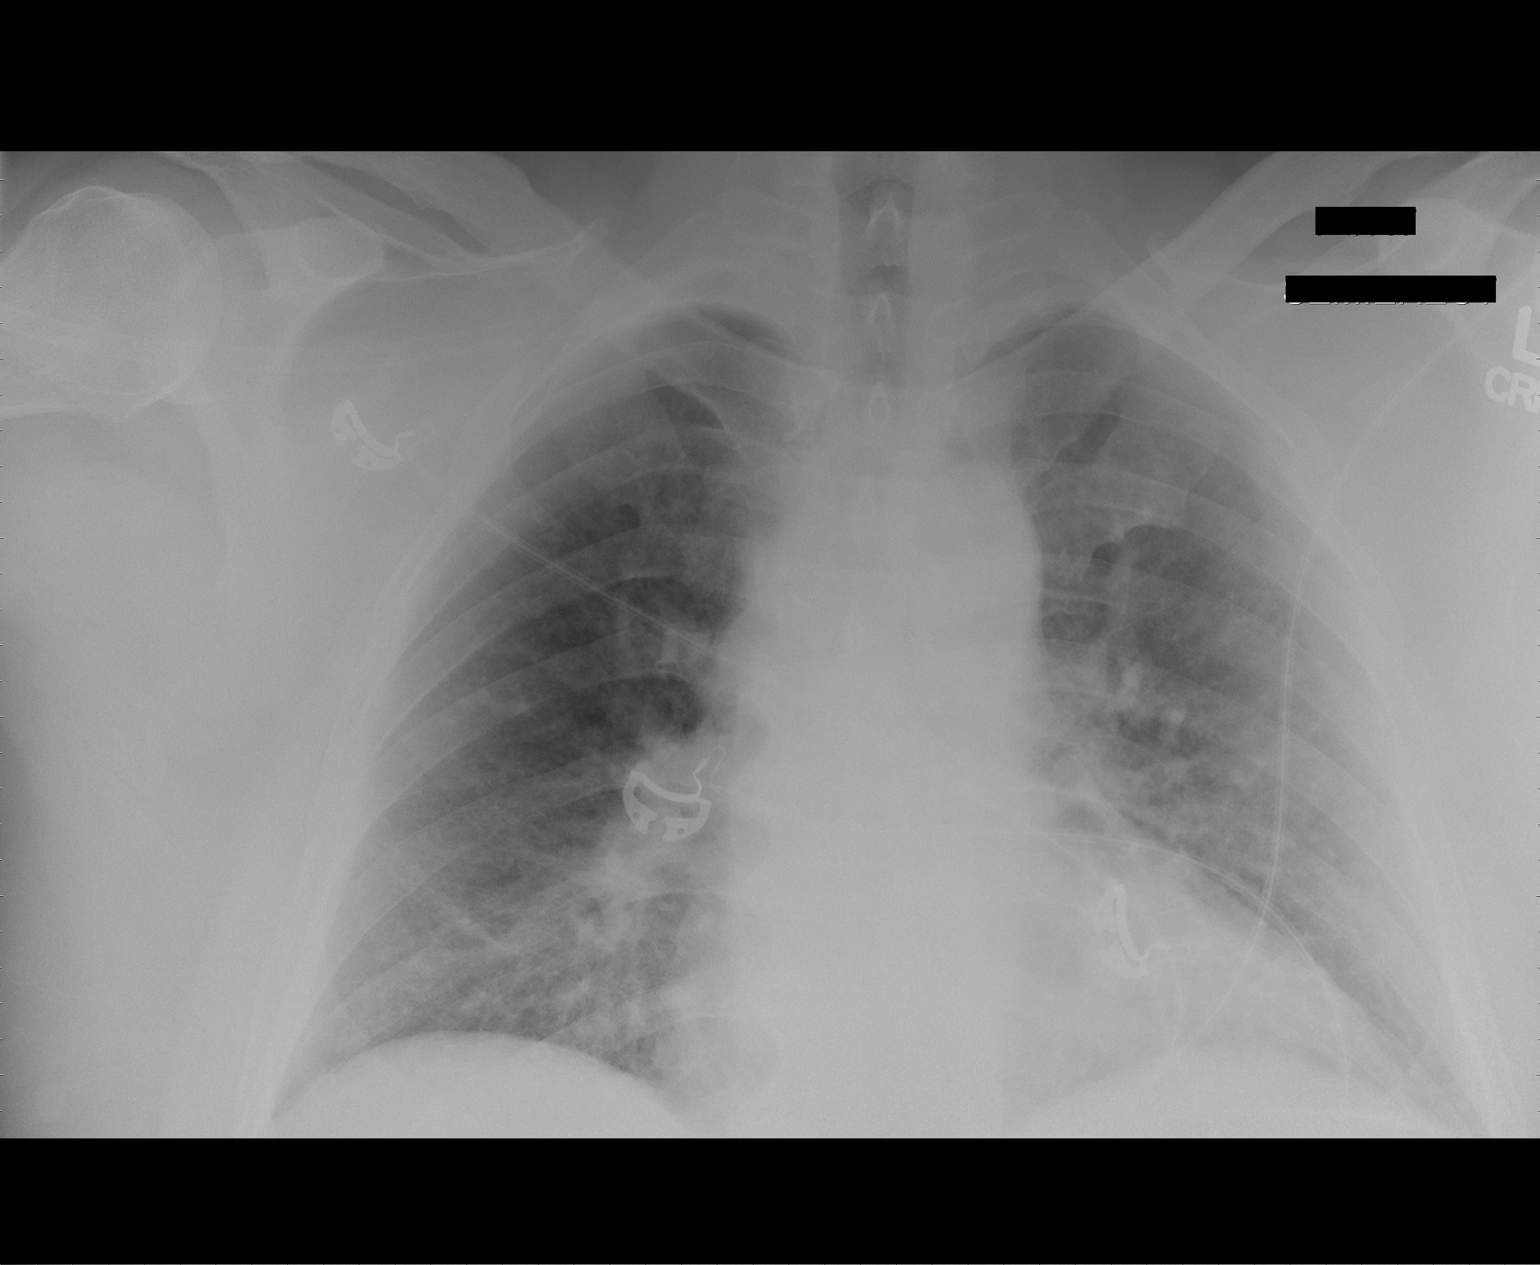

[1 of 1 positions shown; findings below may reference images not displayed]

FINDINGS: The lungs are well-aerated.  Vascular congestion is
noted, with bilateral central and bibasilar airspace opacities,
concerning for pulmonary edema.  No pleural effusion or
pneumothorax is seen.

The cardiomediastinal silhouette is borderline normal in size.  No
acute osseous abnormalities are seen.
IMPRESSION: Vascular congestion, with bilateral central and bibasilar airspace
opacities, concerning for pulmonary edema.

## 2012-02-06 ENCOUNTER — Emergency Department (HOSPITAL_COMMUNITY): Payer: Medicare Other

## 2012-02-06 ENCOUNTER — Emergency Department (HOSPITAL_COMMUNITY)
Admission: EM | Admit: 2012-02-06 | Discharge: 2012-02-06 | Disposition: A | Discharge status: Home / Self Care | Payer: Medicare Other | Attending: Emergency Medicine | Admitting: Emergency Medicine

## 2012-02-06 ENCOUNTER — Encounter (HOSPITAL_COMMUNITY): Payer: Self-pay

## 2012-02-06 DIAGNOSIS — I509 Heart failure, unspecified: Secondary | ICD-10-CM | POA: Insufficient documentation

## 2012-02-06 DIAGNOSIS — M25559 Pain in unspecified hip: Secondary | ICD-10-CM

## 2012-02-06 DIAGNOSIS — I10 Essential (primary) hypertension: Secondary | ICD-10-CM | POA: Insufficient documentation

## 2012-02-06 DIAGNOSIS — F3289 Other specified depressive episodes: Secondary | ICD-10-CM | POA: Insufficient documentation

## 2012-02-06 DIAGNOSIS — J449 Chronic obstructive pulmonary disease, unspecified: Secondary | ICD-10-CM | POA: Insufficient documentation

## 2012-02-06 DIAGNOSIS — R112 Nausea with vomiting, unspecified: Secondary | ICD-10-CM | POA: Insufficient documentation

## 2012-02-06 DIAGNOSIS — J4489 Other specified chronic obstructive pulmonary disease: Secondary | ICD-10-CM | POA: Insufficient documentation

## 2012-02-06 DIAGNOSIS — Z79899 Other long term (current) drug therapy: Secondary | ICD-10-CM | POA: Insufficient documentation

## 2012-02-06 DIAGNOSIS — G8929 Other chronic pain: Secondary | ICD-10-CM | POA: Insufficient documentation

## 2012-02-06 DIAGNOSIS — F329 Major depressive disorder, single episode, unspecified: Secondary | ICD-10-CM | POA: Insufficient documentation

## 2012-02-06 DIAGNOSIS — E785 Hyperlipidemia, unspecified: Secondary | ICD-10-CM | POA: Insufficient documentation

## 2012-02-06 DIAGNOSIS — F17200 Nicotine dependence, unspecified, uncomplicated: Secondary | ICD-10-CM | POA: Insufficient documentation

## 2012-02-06 LAB — CBC
Hemoglobin: 13.1 g/dL (ref 13.0–17.0)
MCH: 30.6 pg (ref 26.0–34.0)
Platelets: 232 10*3/uL (ref 150–400)
RBC: 4.28 MIL/uL (ref 4.22–5.81)
WBC: 9.5 10*3/uL (ref 4.0–10.5)

## 2012-02-06 LAB — DIFFERENTIAL
Basophils Absolute: 0 10*3/uL (ref 0.0–0.1)
Basophils Relative: 0 % (ref 0–1)
Eosinophils Absolute: 0.5 10*3/uL (ref 0.0–0.7)
Eosinophils Relative: 5 % (ref 0–5)
Lymphocytes Relative: 23 % (ref 12–46)
Lymphs Abs: 2.2 10*3/uL (ref 0.7–4.0)
Monocytes Absolute: 1 10*3/uL (ref 0.1–1.0)
Monocytes Relative: 11 % (ref 3–12)
Myelocytes: 0 %
Neutro Abs: 5.8 10*3/uL (ref 1.7–7.7)
Neutrophils Relative %: 61 % (ref 43–77)
nRBC: 0 /100 WBC

## 2012-02-06 LAB — BASIC METABOLIC PANEL
BUN: 15 mg/dL (ref 6–23)
Calcium: 9.7 mg/dL (ref 8.4–10.5)
Chloride: 97 mEq/L (ref 96–112)
Creatinine, Ser: 1.12 mg/dL (ref 0.50–1.35)
GFR calc Af Amer: 84 mL/min — ABNORMAL LOW (ref >90)
GFR calc non Af Amer: 73 mL/min — ABNORMAL LOW (ref >90)

## 2012-02-06 MED ORDER — HYDROMORPHONE HCL PF 1 MG/ML IJ SOLN
1.0000 mg | Freq: Once | INTRAMUSCULAR | Status: AC
  Administered 2012-02-06: 1 mg via INTRAVENOUS
  Filled 2012-02-06: qty 1

## 2012-02-06 MED ORDER — PANTOPRAZOLE SODIUM 40 MG IV SOLR
40.0000 mg | Freq: Once | INTRAVENOUS | Status: AC
  Administered 2012-02-06: 40 mg via INTRAVENOUS
  Filled 2012-02-06: qty 40

## 2012-02-06 MED ORDER — ONDANSETRON HCL 4 MG/2ML IJ SOLN
4.0000 mg | Freq: Once | INTRAMUSCULAR | Status: AC
  Administered 2012-02-06: 4 mg via INTRAVENOUS
  Filled 2012-02-06: qty 2

## 2012-02-06 MED ORDER — PROMETHAZINE HCL 25 MG PO TABS: 12.5000 mg | ORAL_TABLET | Freq: Four times a day (QID) | ORAL | Status: DC | PRN

## 2012-02-06 MED ORDER — IPRATROPIUM BROMIDE 0.02 % IN SOLN
0.5000 mg | Freq: Once | RESPIRATORY_TRACT | Status: AC
  Administered 2012-02-06: 0.5 mg via RESPIRATORY_TRACT
  Filled 2012-02-06: qty 2.5

## 2012-02-06 MED ORDER — SODIUM CHLORIDE 0.9 % IV SOLN: Freq: Once | INTRAVENOUS | Status: DC

## 2012-02-06 MED ORDER — SODIUM CHLORIDE 0.9 % IV BOLUS (SEPSIS)
1000.0000 mL | INTRAVENOUS | Status: AC
  Administered 2012-02-06: 1000 mL via INTRAVENOUS

## 2012-02-06 MED ORDER — ALBUTEROL SULFATE (5 MG/ML) 0.5% IN NEBU
2.5000 mg | INHALATION_SOLUTION | Freq: Once | RESPIRATORY_TRACT | Status: AC
  Administered 2012-02-06: 2.5 mg via RESPIRATORY_TRACT
  Filled 2012-02-06: qty 0.5

## 2012-02-06 MED ORDER — SODIUM CHLORIDE 0.9 % IV BOLUS (SEPSIS): 1000.0000 mL | INTRAVENOUS | Status: DC

## 2012-02-06 MED ORDER — METOCLOPRAMIDE HCL 5 MG/ML IJ SOLN
10.0000 mg | Freq: Once | INTRAMUSCULAR | Status: AC
  Administered 2012-02-06: 10 mg via INTRAVENOUS
  Filled 2012-02-06: qty 2

## 2012-02-06 MED ORDER — DIPHENOXYLATE-ATROPINE 2.5-0.025 MG PO TABS
2.0000 | ORAL_TABLET | Freq: Once | ORAL | Status: AC
  Administered 2012-02-06: 2 via ORAL
  Filled 2012-02-06: qty 2

## 2012-02-06 NOTE — ED Notes (Signed)
Pt alert & oriented x4, stable gait. Pt given discharge instructions, paperwork & prescription(s).  pt verbalized understanding. Pt left department w/ no further questions.  

## 2012-02-06 NOTE — Discharge Instructions (Signed)
Your blood work was normal here tonight and your xray did not show a bronchitis or pneumonia.Bland diet for the next 8-10 hours then progress as tolerated. Use BRAT to help with the diarrhea. Keep your follow up appointment with Dr. Andrey Campanile about your hip. Continue your home medicines.    B.R.A.T. Diet Your doctor has recommended the B.R.A.T. diet for you or your child until the condition improves. This is often used to help control diarrhea and vomiting symptoms. If you or your child can tolerate clear liquids, you may have:  Bananas.   Rice.   Applesauce.   Toast (and other simple starches such as crackers, potatoes, noodles).  Be sure to avoid dairy products, meats, and fatty foods until symptoms are better. Fruit juices such as apple, grape, and prune juice can make diarrhea worse. Avoid these. Continue this diet for 2 days or as instructed by your caregiver. Document Released: 08/09/2005 Document Revised: 07/29/2011 Document Reviewed: 01/26/2007 [facility] Patient Information 2012 [place], [place].

## 2012-02-06 NOTE — ED Notes (Signed)
Pt with no sx/signs of distress; appears comfortable; cough decreased; Pt has drank 2 cups of water without any vomiting; pt states pain is unchanged; states nausea has decreased; Discussed with MD

## 2012-02-06 NOTE — ED Notes (Signed)
Started having nausea, vomiting, diarrhea 2 days ago; coughing and coughing up phelm per pt.

## 2012-02-06 NOTE — ED Provider Notes (Signed)
History     CSN: [id number]  Arrival date & time 02/06/12  0008   First MD Initiated Contact with Patient 02/06/12 0006      Chief Complaint  Patient presents with  . Emesis  . Diarrhea  . Cough    (Consider location/radiation/quality/duration/timing/severity/associated sxs/prior treatment) HPI [name] is a 56 y.o. male who presents to the Emergency Department complaining of nausea, vomiting and diarrhea that began 2 days ago associated with a non productive cough and body aches. In addition he has continued pain to the right hip which is scheduled to be replaced in July. He has a h/o avascular necrosis and has had the right hip replaced 12 weeks ago, now scheduled for the left. His nausea, vomiting and diarrhea have resulted in him not eating or drinking anything for 2 days. Tonight he was unable to urinate. Denies fever, chills. He has taken no medicines.   PCP  Dr. Laury Axon Dr. Andrey Campanile, [facility]   Past Medical History  Diagnosis Date  . COPD (chronic obstructive pulmonary disease)   . Hypertension   . Chronic back pain   . Anxiety disorder   . Hyperlipidemia   . Depression   . Obesity   . History of alcohol abuse   . History of cocaine abuse   . CHF (congestive heart failure)     Past Surgical History  Procedure Date  . 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   . Hip surgery   . Cholecystectomy   . Back surgery     Family History  Problem Relation Age of Onset  . Coronary artery disease      family h/o  . Heart attack      family h/o    History  Substance Use Topics  . Smoking status: Current Everyday Smoker  . Smokeless tobacco: Not on file  . Alcohol Use: Yes     drinks occasionally      Review of Systems  Constitutional:       10 Systems reviewed and are negative for acute change except as noted in the HPI.    HENT: Negative for congestion.   Eyes: Negative for discharge.  Musculoskeletal:       Left hip pain  Neurological: Negative for syncope and numbness.  Psychiatric/Behavioral:       No behavior change.    Allergies  Haloperidol decanoate  Home Medications   Current Outpatient Rx  Name Route Sig Dispense Refill  . ALBUTEROL SULFATE HFA 108 (90 BASE) MCG/ACT IN AERS Inhalation Inhale 2 puffs into the lungs every 4 (four) hours.     . ALPRAZOLAM 1 MG PO TABS Oral Take 1 mg by mouth 3 (three) times daily as needed. For anxiety    . AMLODIPINE BESYLATE 5 MG PO TABS Oral Take 5 mg by mouth daily.      . ASPIRIN 325 MG PO TABS Oral Take 325 mg by mouth daily.    . BUDESONIDE-FORMOTEROL FUMARATE 160-4.5 MCG/ACT IN AERO Inhalation Inhale 1 puff into the lungs 2 (two) times daily.      Marland Kitchen CITRACAL + D PO Oral Take 1 tablet by mouth 2 (two) times daily.    Marland Kitchen CITALOPRAM HYDROBROMIDE 40 MG PO TABS Oral Take 40 mg by mouth daily.      Marland Kitchen HYDROCHLOROTHIAZIDE 25 MG PO TABS Oral Take 25 mg by mouth  daily. For blood pressure    . HYDROXYZINE PAMOATE 25 MG PO CAPS Oral Take 25 mg by mouth every 8 (eight) hours.    . ISOSORBIDE MONONITRATE ER 30 MG PO TB24 Oral Take 30 mg by mouth daily.      Marland Kitchen METOPROLOL TARTRATE 25 MG PO TABS Oral Take 25 mg by mouth daily.     Marland Kitchen NITROGLYCERIN 0.4 MG SL SUBL Sublingual Place 0.4 mg under the tongue every 5 (five) minutes as needed.      Marland Kitchen OMEPRAZOLE 20 MG PO CPDR Oral Take 20 mg by mouth daily.    . OXYCODONE-ACETAMINOPHEN 10-325 MG PO TABS Oral Take 1 tablet by mouth every 4 (four) hours as needed. For pain     . TRAZODONE HCL 100 MG PO TABS Oral Take 100 mg by mouth at bedtime.      . OXYCODONE HCL ER 15 MG PO TB12 Oral Take 15 mg by mouth 4 (four) times daily.        BP 121/57  Pulse 101  Temp 98.5 F (36.9 C) (Oral)  Resp 18  Ht 6\' 3"  (1.905 m)  Wt 327 lb (148.326 kg)  BMI 40.87 kg/m2  SpO2 95%  Physical Exam  Nursing note and vitals  reviewed. Constitutional: He is oriented to person, place, and time. He appears well-developed and well-nourished. No distress.       Awake, alert, nontoxic appearance.  HENT:  Head: Normocephalic and atraumatic.       edentulous  Eyes: EOM are normal. Pupils are equal, round, and reactive to light. Right eye exhibits no discharge. Left eye exhibits no discharge.  Neck: Normal range of motion. Neck supple.  Cardiovascular: Regular rhythm and normal heart sounds.   Pulmonary/Chest: Effort normal. He has wheezes. He exhibits no tenderness.       coughing  Abdominal: Soft. Bowel sounds are normal. There is no tenderness. There is no rebound.  Musculoskeletal: He exhibits no tenderness.       Baseline ROM, no obvious new focal weakness. Right hip with well healed incision from recent replacement 12 weeks ago. Left hip with discomfort to palpation and with movement.   Neurological: He is alert and oriented to person, place, and time.       Mental status and motor strength appears baseline for patient and situation.  Skin: Skin is warm and dry. No rash noted.  Psychiatric: He has a normal mood and affect.    ED Course  Procedures (including critical care time)  Results for orders placed during the hospital encounter of 02/06/12  CBC      Component Value Range   WBC 9.5  4.0 - 10.5 K/uL   RBC 4.28  4.22 - 5.81 MIL/uL   Hemoglobin 13.1  13.0 - 17.0 g/dL   HCT 96.0  45.4 - 09.8 %   MCV 91.4  78.0 - 100.0 fL   MCH 30.6  26.0 - 34.0 pg   MCHC 33.5  30.0 - 36.0 g/dL   RDW 11.9  14.7 - 82.9 %   Platelets 232  150 - 400 K/uL  DIFFERENTIAL      Component Value Range   Neutrophils Relative 61  43 - 77 %   Lymphocytes Relative 23  12 - 46 %   Monocytes Relative 11  3 - 12 %   Eosinophils Relative 5  0 - 5 %   Basophils Relative 0  0 - 1 %   Band Neutrophils 0  0 -  10 %   Metamyelocytes Relative 0     Myelocytes 0     Promyelocytes Absolute 0     Blasts 0     nRBC 0  0 /100 WBC   Neutro  Abs 5.8  1.7 - 7.7 K/uL   Lymphs Abs 2.2  0.7 - 4.0 K/uL   Monocytes Absolute 1.0  0.1 - 1.0 K/uL   Eosinophils Absolute 0.5  0.0 - 0.7 K/uL   Basophils Absolute 0.0  0.0 - 0.1 K/uL  BASIC METABOLIC PANEL      Component Value Range   Sodium 134 (*) 135 - 145 mEq/L   Potassium 3.6  3.5 - 5.1 mEq/L   Chloride 97  96 - 112 mEq/L   CO2 27  19 - 32 mEq/L   Glucose, Bld 85  70 - 99 mg/dL   BUN 15  6 - 23 mg/dL   Creatinine, Ser 1.61  0.50 - 1.35 mg/dL   Calcium 9.7  8.4 - 09.6 mg/dL   GFR calc non Af Amer 73 (*) >90 mL/min   GFR calc Af Amer 84 (*) >90 mL/min   Dg Chest Port 1 View  02/06/2012  *RADIOLOGY REPORT*  Clinical Data: Cough, difficulty breathing and congestion.  PORTABLE CHEST - 1 VIEW  Comparison: Chest radiograph performed 11/29/2011  Findings: The lungs are well-aerated.  Vascular congestion is noted, with mildly increased interstitial markings, possibly reflecting mild interstitial edema.  This is perhaps slightly worsened from the prior study.  No definite pleural effusion or pneumothorax is seen.  The cardiomediastinal silhouette is borderline enlarged.  No acute osseous abnormalities are seen.  IMPRESSION: Vascular congestion and borderline cardiomegaly, with mildly increased interstitial markings, possibly reflecting mild interstitial edema.  This is perhaps slightly worsened from the prior study.  Original Report Authenticated By: Tonia Ghent, M.D.    MDM  Patient with recent nausea, vomiting and diarrhea for two days. Given IVF, antiemeitc, PPI, analgesic. Patient given analgesic for chronic and increased pain to left hip. Able to take PO fluids. No vomiting or diarrhea since arrival in the ER. Labs are unremarkable, xray without acute findings. Patient was given albuterol/atrovent nebulizer treatment with improvement in cough and wheezing. Pt feels improved after observation and/or treatment in ED.Pt stable in ED with no significant deterioration in condition.The patient  appears reasonably screened and/or stabilized for discharge and I doubt any other medical condition or other [facility] requiring further screening, evaluation, or treatment in the ED at this time prior to discharge.  MDM Reviewed: nursing note and vitals Interpretation: labs and x-ray           Nicoletta Dress. Colon Branch, MD 02/06/12 0454

## 2012-02-18 ENCOUNTER — Emergency Department (HOSPITAL_COMMUNITY)
Admission: EM | Admit: 2012-02-18 | Discharge: 2012-02-19 | Disposition: A | Discharge status: Home / Self Care | Payer: Medicare Other | Attending: Emergency Medicine | Admitting: Emergency Medicine

## 2012-02-18 ENCOUNTER — Encounter (HOSPITAL_COMMUNITY): Payer: Self-pay | Admitting: *Deleted

## 2012-02-18 ENCOUNTER — Emergency Department (HOSPITAL_COMMUNITY): Payer: Medicare Other

## 2012-02-18 DIAGNOSIS — R062 Wheezing: Secondary | ICD-10-CM | POA: Insufficient documentation

## 2012-02-18 DIAGNOSIS — J40 Bronchitis, not specified as acute or chronic: Secondary | ICD-10-CM | POA: Insufficient documentation

## 2012-02-18 DIAGNOSIS — R079 Chest pain, unspecified: Secondary | ICD-10-CM | POA: Insufficient documentation

## 2012-02-18 DIAGNOSIS — Z79899 Other long term (current) drug therapy: Secondary | ICD-10-CM | POA: Insufficient documentation

## 2012-02-18 DIAGNOSIS — I10 Essential (primary) hypertension: Secondary | ICD-10-CM | POA: Insufficient documentation

## 2012-02-18 DIAGNOSIS — R0602 Shortness of breath: Secondary | ICD-10-CM | POA: Insufficient documentation

## 2012-02-18 HISTORY — DX: Pneumonia, unspecified organism: J18.9

## 2012-02-18 MED ORDER — OXYCODONE-ACETAMINOPHEN 5-325 MG PO TABS
2.0000 | ORAL_TABLET | Freq: Once | ORAL | Status: AC
  Administered 2012-02-18: 2 via ORAL
  Filled 2012-02-18: qty 2

## 2012-02-18 MED ORDER — ALBUTEROL SULFATE (5 MG/ML) 0.5% IN NEBU
INHALATION_SOLUTION | RESPIRATORY_TRACT | Status: AC
  Administered 2012-02-18: 5 mg
  Filled 2012-02-18: qty 1

## 2012-02-18 MED ORDER — ALBUTEROL SULFATE (5 MG/ML) 0.5% IN NEBU: 5.0000 mg | INHALATION_SOLUTION | Freq: Once | RESPIRATORY_TRACT | Status: DC

## 2012-02-18 MED ORDER — IPRATROPIUM BROMIDE 0.02 % IN SOLN
RESPIRATORY_TRACT | Status: AC
  Administered 2012-02-18: 0.5 mg
  Filled 2012-02-18: qty 2.5

## 2012-02-18 MED ORDER — PREDNISONE 20 MG PO TABS
60.0000 mg | ORAL_TABLET | Freq: Once | ORAL | Status: AC
  Administered 2012-02-18: 60 mg via ORAL
  Filled 2012-02-18: qty 3

## 2012-02-18 MED ORDER — IPRATROPIUM BROMIDE 0.02 % IN SOLN: 0.5000 mg | Freq: Once | RESPIRATORY_TRACT | Status: DC

## 2012-02-18 NOTE — ED Provider Notes (Signed)
History   This chart was scribed for Joya Gaskins, MD by Charolett Bumpers . The patient was seen in room APA09/APA09.    CSN: [id number]  Arrival date & time 02/18/12  2318   First MD Initiated Contact with Patient 02/18/12 2330      Chief Complaint  Patient presents with  . Shortness of Breath     HPI [name] is a 56 y.o. male who presents to the Emergency Department complaining of constant, moderate SOB with associated cough for the past several days. Patient reports that he was recently admitted to [facility] and was discharged on Monday with pneumonia. Reports being discharged with antibiotics and has been compliant with medications. Patient states that he finished his abx 2 days ago. Patient also reports associated chest wall pain that is aggravated with coughing.     Past Medical History  Diagnosis Date  . COPD (chronic obstructive pulmonary disease)   . Hypertension   . Chronic back pain   . Anxiety disorder   . Hyperlipidemia   . Depression   . Obesity   . History of alcohol abuse   . History of cocaine abuse   . CHF (congestive heart failure)   . Pneumonia     Past Surgical History  Procedure Date  . 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   . Hip surgery   . Cholecystectomy   . Back surgery     Family History  Problem Relation Age of Onset  . Coronary artery disease      family h/o  . Heart attack      family h/o    History  Substance Use Topics  . Smoking status: Current Everyday Smoker  . Smokeless tobacco: Not on file  . Alcohol Use: Yes     drinks occasionally      Review of Systems  Respiratory: Positive for cough and shortness of breath.   Cardiovascular: Positive for chest pain.  Gastrointestinal: Negative for vomiting, abdominal pain and diarrhea.  All other systems reviewed and are  negative.    Allergies  Haloperidol decanoate  Home Medications   Current Outpatient Rx  Name Route Sig Dispense Refill  . ALBUTEROL SULFATE HFA 108 (90 BASE) MCG/ACT IN AERS Inhalation Inhale 2 puffs into the lungs every 4 (four) hours.     . ALPRAZOLAM 1 MG PO TABS Oral Take 1 mg by mouth 3 (three) times daily as needed. For anxiety    . AMLODIPINE BESYLATE 5 MG PO TABS Oral Take 5 mg by mouth daily.      . ASPIRIN 325 MG PO TABS Oral Take 325 mg by mouth daily.    . BUDESONIDE-FORMOTEROL FUMARATE 160-4.5 MCG/ACT IN AERO Inhalation Inhale 1 puff into the lungs 2 (two) times daily.      Marland Kitchen CITRACAL + D PO Oral Take 1 tablet by mouth 2 (two) times daily.    Marland Kitchen CITALOPRAM HYDROBROMIDE 40 MG PO TABS Oral Take 40 mg by mouth daily.      Marland Kitchen HYDROCHLOROTHIAZIDE 25 MG PO TABS Oral Take 25 mg by mouth daily. For blood pressure    . HYDROXYZINE PAMOATE 25 MG PO CAPS Oral Take 25 mg by mouth every 8 (eight) hours.    . ISOSORBIDE MONONITRATE ER 30 MG PO TB24 Oral Take 30 mg by mouth daily.      Marland Kitchen  METOPROLOL TARTRATE 25 MG PO TABS Oral Take 25 mg by mouth daily.     Marland Kitchen NITROGLYCERIN 0.4 MG SL SUBL Sublingual Place 0.4 mg under the tongue every 5 (five) minutes as needed.      Marland Kitchen OMEPRAZOLE 20 MG PO CPDR Oral Take 20 mg by mouth daily.    . OXYCODONE HCL ER 15 MG PO TB12 Oral Take 15 mg by mouth 4 (four) times daily.      . OXYCODONE-ACETAMINOPHEN 10-325 MG PO TABS Oral Take 1 tablet by mouth every 4 (four) hours as needed. For pain     . PROMETHAZINE HCL 25 MG PO TABS Oral Take 0.5 tablets (12.5 mg total) by mouth every 6 (six) hours as needed for nausea. 10 tablet 0  . TRAZODONE HCL 100 MG PO TABS Oral Take 100 mg by mouth at bedtime.        BP 127/67  Pulse 90  Temp 98.2 F (36.8 C)  Resp 24  Ht 6\' 3"  (1.905 m)  Wt 322 lb (146.058 kg)  BMI 40.25 kg/m2  SpO2 99%  Physical Exam CONSTITUTIONAL: Well developed/well nourished HEAD AND FACE: Normocephalic/atraumatic EYES: EOMI/PERRL ENMT:  Mucous membranes moist NECK: supple no meningeal signs SPINE:entire spine nontender CV: S1/S2 noted, no murmurs/rubs/gallops noted Chest - tenderness to palpation, no crepitance LUNGS: no apparent distress, wheezes bilaterally ABDOMEN: soft, nontender, no rebound or guarding GU:no cva tenderness NEURO: Pt is awake/alert, moves all extremitiesx4 EXTREMITIES: pulses normal, full ROM SKIN: warm, color normal PSYCH: no abnormalities of mood noted  ED Course  Procedures   DIAGNOSTIC STUDIES: Oxygen Saturation is 99% on room air, normal by my interpretation.    COORDINATION OF CARE:  2336: Discussed planned course of treatment with the patient, who is agreeable at this time.   Patient improved after nebs.  His wheeze is present but improved.  He is not tachypneic and no hypoxia noted BP 133/80  Pulse 94  Temp 98.2 F (36.8 C)  Resp 18  Ht 6\' 3"  (1.905 m)  Wt 322 lb (146.058 kg)  BMI 40.25 kg/m2  SpO2 100% I feel he is safe for d/c.  He has albuterol at home and we discussed use of albuterol.  Also will start prednisone I doubt Pe/ACS/CHF at this time Pt walking around the ED in no distress     MDM  Nursing notes including past medical history and social history reviewed and considered in documentation xrays reviewed and considered      Date: 02/18/2012  Rate: 91  Rhythm: normal sinus rhythm  QRS Axis: normal  Intervals: normal  ST/T Wave abnormalities: normal  Conduction Disutrbances:none  Narrative Interpretation:   Old EKG Reviewed: unchanged     I personally performed the services described in this documentation, which was scribed in my presence. The recorded information has been reviewed and considered.          Joya Gaskins, MD 02/19/12 [phone]

## 2012-02-18 NOTE — ED Notes (Signed)
Pt reporting SOB, and cough.  Reports was recently admitted to [facility] and was discharged on Monday with pneumonia. Reports being discharged with antibiotics and has been compliant with medications.

## 2012-02-19 MED ORDER — ALBUTEROL (5 MG/ML) CONTINUOUS INHALATION SOLN
INHALATION_SOLUTION | RESPIRATORY_TRACT | Status: AC
  Filled 2012-02-19: qty 20

## 2012-02-19 MED ORDER — ALBUTEROL SULFATE (5 MG/ML) 0.5% IN NEBU
10.0000 mg | INHALATION_SOLUTION | Freq: Once | RESPIRATORY_TRACT | Status: AC
  Administered 2012-02-19: 10 mg via RESPIRATORY_TRACT

## 2012-02-19 MED ORDER — PREDNISONE 50 MG PO TABS: ORAL_TABLET | ORAL | Status: AC

## 2012-02-19 NOTE — ED Notes (Signed)
Respiratory paged for hour neb.

## 2012-02-19 NOTE — Discharge Instructions (Signed)
Bronchitis Bronchitis is a problem of the air tubes leading to your lungs. This problem makes it hard for air to get in and out of the lungs. You may cough a lot because your air tubes are narrow. Going without care can cause lasting (chronic) bronchitis. HOME CARE   Drink enough fluids to keep your pee (urine) clear or pale yellow.   Use a cool mist humidifier.   Quit smoking if you smoke. If you keep smoking, the bronchitis might not get better.   Only take medicine as told by your doctor.  GET HELP RIGHT AWAY IF:   Coughing keeps you awake.   You start to wheeze.   You become more sick or weak.   You have a hard time breathing or get short of breath.   You cough up blood.   Coughing lasts more than 2 weeks.   You have a fever.    MAKE SURE YOU:  Understand these instructions.   Will watch your condition.   Will get help right away if you are not doing well or get worse.  Document Released: 01/26/2008 Document Revised: 07/29/2011 Document Reviewed: 07/11/2009 [facility] Patient Information 2012 [place], [place].

## 2012-04-21 ENCOUNTER — Other Ambulatory Visit: Payer: Self-pay | Admitting: Orthopedic Surgery

## 2012-05-03 ENCOUNTER — Encounter (HOSPITAL_COMMUNITY): Payer: Self-pay | Admitting: Pharmacy Technician

## 2012-05-03 ENCOUNTER — Encounter (HOSPITAL_COMMUNITY): Payer: Self-pay

## 2012-05-03 ENCOUNTER — Encounter (HOSPITAL_COMMUNITY)
Admission: RE | Admit: 2012-05-03 | Discharge: 2012-05-03 | Disposition: A | Discharge status: Home / Self Care | Payer: Medicare Other | Source: Ambulatory Visit | Attending: Orthopedic Surgery | Admitting: Orthopedic Surgery

## 2012-05-03 HISTORY — DX: Headache: R51

## 2012-05-03 HISTORY — DX: Unspecified osteoarthritis, unspecified site: M19.90

## 2012-05-03 HISTORY — DX: Gastro-esophageal reflux disease without esophagitis: K21.9

## 2012-05-03 LAB — URINALYSIS, ROUTINE W REFLEX MICROSCOPIC
Bilirubin Urine: NEGATIVE
Glucose, UA: NEGATIVE mg/dL
Ketones, ur: NEGATIVE mg/dL
Protein, ur: NEGATIVE mg/dL
Urobilinogen, UA: 0.2 mg/dL (ref 0.0–1.0)

## 2012-05-03 LAB — CBC WITH DIFFERENTIAL/PLATELET
Eosinophils Relative: 6 % — ABNORMAL HIGH (ref 0–5)
Lymphocytes Relative: 34 % (ref 12–46)
Lymphs Abs: 3.3 10*3/uL (ref 0.7–4.0)
MCV: 93.1 fL (ref 78.0–100.0)
Neutro Abs: 5.2 10*3/uL (ref 1.7–7.7)
Platelets: 260 10*3/uL (ref 150–400)
RBC: 4.52 MIL/uL (ref 4.22–5.81)
WBC: 9.9 10*3/uL (ref 4.0–10.5)

## 2012-05-03 LAB — BASIC METABOLIC PANEL
CO2: 23 mEq/L (ref 19–32)
Calcium: 9.6 mg/dL (ref 8.4–10.5)
Chloride: 103 mEq/L (ref 96–112)
GFR calc Af Amer: >90 mL/min (ref >90)
Sodium: 139 mEq/L (ref 135–145)

## 2012-05-03 LAB — PROTIME-INR
INR: 0.91 (ref 0.00–1.49)
Prothrombin Time: 12.5 seconds (ref 11.6–15.2)

## 2012-05-03 LAB — TYPE AND SCREEN: ABO/RH(D): A NEG

## 2012-05-03 LAB — SURGICAL PCR SCREEN: Staphylococcus aureus: NEGATIVE

## 2012-05-03 LAB — APTT: aPTT: 31 seconds (ref 24–37)

## 2012-05-03 LAB — ABO/RH: ABO/RH(D): A NEG

## 2012-05-03 MED ORDER — CHLORHEXIDINE GLUCONATE 4 % EX LIQD: 60.0000 mL | Freq: Once | CUTANEOUS | Status: DC

## 2012-05-03 NOTE — Pre-Procedure Instructions (Addendum)
20 NENG ALBEE  05/03/2012   Your procedure is scheduled on:  05/08/12  Report to [facility] at 1030 AM.  Call this number if you have problems the morning of surgery: [phone]   Remember:   Do not eat food or drink:After Midnight.    Take these medicines the morning of surgery with A SIP OF WATER: xanax, inhalers, amlodipine,celexa, imdur, metoprolol, prilosec  STOP  Aspirin, now   Do not wear jewelry, make-up or nail polish.  Do not wear lotions, powders, or perfumes. You may wear deodorant.  Do not shave 48 hours prior to surgery. Men may shave face and neck.  Do not bring valuables to the hospital.  Contacts, dentures or bridgework may not be worn into surgery.  Leave suitcase in the car. After surgery it may be brought to your room.  For patients admitted to the hospital, checkout time is 11:00 AM the day of discharge.   Patients discharged the day of surgery will not be allowed to drive home.  Name and phone number of your driver: Raynelle Fanning [phone] wife  Special Instructions: Incentive Spirometry - Practice and bring it with you on the day of surgery. and CHG Shower Use Special Wash: 1/2 bottle night before surgery and 1/2 bottle morning of surgery.   Please read over the following fact sheets that you were given: Pain Booklet, Coughing and Deep Breathing, Blood Transfusion Information, Total Joint Packet, MRSA Information and Surgical Site Infection Prevention

## 2012-05-03 NOTE — Progress Notes (Signed)
Stress 6/08 ,echo 8/07 ,ekg 7/13 cath 7/12 all basically normal with some abn anomaly per note in epic

## 2012-05-05 NOTE — Progress Notes (Signed)
Patient called 05/05/12 at 1550 needs to arrive at 820 on 05/08/12. Message left on machine.

## 2012-05-05 NOTE — Progress Notes (Signed)
Patients wife verified  Surgery arrival time of 8:20am.

## 2012-05-06 NOTE — H&P (Signed)
  HPI: Patient presents with a chief complaint of left hip pain that began approximately 8 months ago with no known mechanism of injury.  [name] has had his right hip replaced by another physician several months ago.  He reports that the right hip is doing very well.  He had an MRI scan in December of 2012 that showed bilateral AVN of the hips.  At that time he had collapse on the right side.  Recently his left hip pain has been worsening.  He localizes it to the groin area and states that it radiates laterally.  He is ambulating with a cane and using 10 mg Percocet for pain control.  All: Haldol, Dilaudid, Ativan  ROS: 14 point review of systems form filled out by the patient was reviewed and was negative as it relates to the history of present illness except for: shortness of breath  PMH: Hypertension  FHx: Diabetes, hypertension, heart disease, arthritis  SocHx:  He smokes but denies use of alcohol.  He is married and disabled  PHYSICAL EXAM: Well-developed, well-nourished.  Awake, alert, and oriented x3.  Extraocular motion is intact.  No use of accessory respiratory muscles for breathing.   Cardiovascular exam reveals a regular rhythm.  Skin is intact without cuts, scrapes, or abrasions. He 6 feet 3 inches tall and weighs 327 pounds.  BMI is 41.  Exam of the left hip demonstrates significant pain with internal rotation.  Foot tap is negative.  He is neurovascularly intact.  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  He would like to proceed with left total hip arthroplasty.  He understands that the surgery will be slightly more difficult due to his body habitus and the cyst in his femur.  We'll plan on using an S-ROM stem and a Pinnacle cup.  He will talk to Iatan today about scheduling.

## 2012-05-07 MED ORDER — DEXTROSE 5 % IV SOLN
3.0000 g | INTRAVENOUS | Status: AC
  Administered 2012-05-08: 3 g via INTRAVENOUS
  Filled 2012-05-07: qty 3000

## 2012-05-08 ENCOUNTER — Encounter (HOSPITAL_COMMUNITY): Payer: Self-pay | Admitting: Anesthesiology

## 2012-05-08 ENCOUNTER — Encounter (HOSPITAL_COMMUNITY)
Admission: RE | Disposition: A | Discharge status: Skilled Nursing Facility | Payer: Self-pay | Source: Ambulatory Visit | Attending: Internal Medicine

## 2012-05-08 ENCOUNTER — Inpatient Hospital Stay (HOSPITAL_COMMUNITY): Payer: Medicare Other

## 2012-05-08 ENCOUNTER — Inpatient Hospital Stay (HOSPITAL_COMMUNITY): Payer: Medicare Other | Admitting: Anesthesiology

## 2012-05-08 ENCOUNTER — Inpatient Hospital Stay (HOSPITAL_COMMUNITY)
Admission: RE | Admit: 2012-05-08 | Discharge: 2012-05-15 | DRG: 469 | Disposition: A | Discharge status: Skilled Nursing Facility | Payer: Medicare Other | Source: Ambulatory Visit | Attending: Internal Medicine | Admitting: Internal Medicine

## 2012-05-08 DIAGNOSIS — K449 Diaphragmatic hernia without obstruction or gangrene: Secondary | ICD-10-CM | POA: Diagnosis present

## 2012-05-08 DIAGNOSIS — M87059 Idiopathic aseptic necrosis of unspecified femur: Principal | ICD-10-CM | POA: Diagnosis present

## 2012-05-08 DIAGNOSIS — J690 Pneumonitis due to inhalation of food and vomit: Secondary | ICD-10-CM | POA: Diagnosis not present

## 2012-05-08 DIAGNOSIS — D500 Iron deficiency anemia secondary to blood loss (chronic): Secondary | ICD-10-CM

## 2012-05-08 DIAGNOSIS — E876 Hypokalemia: Secondary | ICD-10-CM

## 2012-05-08 DIAGNOSIS — G929 Unspecified toxic encephalopathy: Secondary | ICD-10-CM | POA: Diagnosis present

## 2012-05-08 DIAGNOSIS — G8918 Other acute postprocedural pain: Secondary | ICD-10-CM

## 2012-05-08 DIAGNOSIS — G92 Toxic encephalopathy: Secondary | ICD-10-CM | POA: Diagnosis present

## 2012-05-08 DIAGNOSIS — D62 Acute posthemorrhagic anemia: Secondary | ICD-10-CM | POA: Diagnosis not present

## 2012-05-08 DIAGNOSIS — Z01812 Encounter for preprocedural laboratory examination: Secondary | ICD-10-CM

## 2012-05-08 DIAGNOSIS — J9620 Acute and chronic respiratory failure, unspecified whether with hypoxia or hypercapnia: Secondary | ICD-10-CM

## 2012-05-08 DIAGNOSIS — G9340 Encephalopathy, unspecified: Secondary | ICD-10-CM | POA: Diagnosis not present

## 2012-05-08 DIAGNOSIS — E871 Hypo-osmolality and hyponatremia: Secondary | ICD-10-CM | POA: Diagnosis present

## 2012-05-08 DIAGNOSIS — F17200 Nicotine dependence, unspecified, uncomplicated: Secondary | ICD-10-CM | POA: Diagnosis present

## 2012-05-08 DIAGNOSIS — N179 Acute kidney failure, unspecified: Secondary | ICD-10-CM | POA: Diagnosis present

## 2012-05-08 DIAGNOSIS — G4733 Obstructive sleep apnea (adult) (pediatric): Secondary | ICD-10-CM | POA: Diagnosis present

## 2012-05-08 DIAGNOSIS — I10 Essential (primary) hypertension: Secondary | ICD-10-CM

## 2012-05-08 DIAGNOSIS — E785 Hyperlipidemia, unspecified: Secondary | ICD-10-CM | POA: Diagnosis present

## 2012-05-08 DIAGNOSIS — J449 Chronic obstructive pulmonary disease, unspecified: Secondary | ICD-10-CM

## 2012-05-08 DIAGNOSIS — E872 Acidosis, unspecified: Secondary | ICD-10-CM | POA: Diagnosis present

## 2012-05-08 DIAGNOSIS — J4489 Other specified chronic obstructive pulmonary disease: Secondary | ICD-10-CM | POA: Diagnosis present

## 2012-05-08 DIAGNOSIS — Z77090 Contact with and (suspected) exposure to asbestos: Secondary | ICD-10-CM

## 2012-05-08 DIAGNOSIS — M87052 Idiopathic aseptic necrosis of left femur: Secondary | ICD-10-CM

## 2012-05-08 HISTORY — PX: TOTAL HIP ARTHROPLASTY: SHX124

## 2012-05-08 SURGERY — ARTHROPLASTY, HIP, TOTAL,POSTERIOR APPROACH
Anesthesia: General | Site: Hip | Laterality: Left | Wound class: Clean

## 2012-05-08 MED ORDER — DIPHENHYDRAMINE HCL 12.5 MG/5ML PO ELIX: 12.5000 mg | ORAL_SOLUTION | ORAL | Status: DC | PRN

## 2012-05-08 MED ORDER — ALPRAZOLAM 0.5 MG PO TABS
1.0000 mg | ORAL_TABLET | Freq: Three times a day (TID) | ORAL | Status: DC | PRN
  Filled 2012-05-08: qty 2

## 2012-05-08 MED ORDER — NITROGLYCERIN 0.4 MG SL SUBL: 0.4000 mg | SUBLINGUAL_TABLET | SUBLINGUAL | Status: DC | PRN

## 2012-05-08 MED ORDER — SODIUM CHLORIDE 0.9 % IR SOLN
Status: DC | PRN
  Administered 2012-05-08: 1000 mL

## 2012-05-08 MED ORDER — AMLODIPINE BESYLATE 5 MG PO TABS
5.0000 mg | ORAL_TABLET | Freq: Every day | ORAL | Status: DC
  Administered 2012-05-08 – 2012-05-13 (×4): 5 mg via ORAL
  Filled 2012-05-08 (×6): qty 1

## 2012-05-08 MED ORDER — TRAZODONE HCL 100 MG PO TABS
100.0000 mg | ORAL_TABLET | Freq: Every day | ORAL | Status: DC
  Administered 2012-05-08 – 2012-05-14 (×5): 100 mg via ORAL
  Filled 2012-05-08 (×9): qty 1

## 2012-05-08 MED ORDER — GLYCOPYRROLATE 0.2 MG/ML IJ SOLN
INTRAMUSCULAR | Status: DC | PRN
  Administered 2012-05-08: .6 mg via INTRAVENOUS

## 2012-05-08 MED ORDER — ASPIRIN EC 325 MG PO TBEC
325.0000 mg | DELAYED_RELEASE_TABLET | Freq: Two times a day (BID) | ORAL | Status: DC
  Administered 2012-05-08 – 2012-05-10 (×5): 325 mg via ORAL
  Filled 2012-05-08 (×6): qty 1

## 2012-05-08 MED ORDER — ONDANSETRON HCL 4 MG/2ML IJ SOLN
4.0000 mg | Freq: Four times a day (QID) | INTRAMUSCULAR | Status: DC | PRN
  Filled 2012-05-08: qty 2

## 2012-05-08 MED ORDER — MENTHOL 3 MG MT LOZG: 1.0000 | LOZENGE | OROMUCOSAL | Status: DC | PRN

## 2012-05-08 MED ORDER — HYDROCHLOROTHIAZIDE 25 MG PO TABS
25.0000 mg | ORAL_TABLET | Freq: Every day | ORAL | Status: DC
  Administered 2012-05-08 – 2012-05-09 (×2): 25 mg via ORAL
  Filled 2012-05-08 (×2): qty 1

## 2012-05-08 MED ORDER — MORPHINE SULFATE (PF) 1 MG/ML IV SOLN
INTRAVENOUS | Status: DC
  Administered 2012-05-08 (×2): via INTRAVENOUS
  Administered 2012-05-08: 16.5 mg via INTRAVENOUS
  Administered 2012-05-09: 19.32 mg via INTRAVENOUS
  Administered 2012-05-09: 12.5 mg via INTRAVENOUS
  Administered 2012-05-09: 1 mg via INTRAVENOUS
  Administered 2012-05-09: 10.5 mg via INTRAVENOUS
  Administered 2012-05-09: 12.63 mg via INTRAVENOUS
  Administered 2012-05-09: 02:00:00 via INTRAVENOUS
  Filled 2012-05-08 (×4): qty 25

## 2012-05-08 MED ORDER — MIDAZOLAM HCL 5 MG/5ML IJ SOLN
INTRAMUSCULAR | Status: DC | PRN
  Administered 2012-05-08: 2 mg via INTRAVENOUS

## 2012-05-08 MED ORDER — CITALOPRAM HYDROBROMIDE 40 MG PO TABS
40.0000 mg | ORAL_TABLET | Freq: Every day | ORAL | Status: DC
  Administered 2012-05-09 – 2012-05-15 (×5): 40 mg via ORAL
  Filled 2012-05-08 (×8): qty 1

## 2012-05-08 MED ORDER — FENTANYL CITRATE 0.05 MG/ML IJ SOLN
INTRAMUSCULAR | Status: DC | PRN
  Administered 2012-05-08 (×3): 50 ug via INTRAVENOUS
  Administered 2012-05-08: 250 ug via INTRAVENOUS
  Administered 2012-05-08 (×2): 50 ug via INTRAVENOUS

## 2012-05-08 MED ORDER — METHOCARBAMOL 500 MG PO TABS
500.0000 mg | ORAL_TABLET | Freq: Four times a day (QID) | ORAL | Status: DC | PRN
  Administered 2012-05-08 – 2012-05-09 (×2): 500 mg via ORAL
  Filled 2012-05-08 (×3): qty 1

## 2012-05-08 MED ORDER — ONDANSETRON HCL 4 MG/2ML IJ SOLN
INTRAMUSCULAR | Status: DC | PRN
  Administered 2012-05-08: 4 mg via INTRAVENOUS

## 2012-05-08 MED ORDER — ALUM & MAG HYDROXIDE-SIMETH 200-200-20 MG/5ML PO SUSP
30.0000 mL | ORAL | Status: DC | PRN
  Filled 2012-05-08: qty 30

## 2012-05-08 MED ORDER — MORPHINE SULFATE (PF) 1 MG/ML IV SOLN
INTRAVENOUS | Status: AC
  Administered 2012-05-08: 21:00:00
  Filled 2012-05-08: qty 25

## 2012-05-08 MED ORDER — MAGNESIUM HYDROXIDE 400 MG/5ML PO SUSP: 30.0000 mL | Freq: Every day | ORAL | Status: DC | PRN

## 2012-05-08 MED ORDER — ALBUMIN HUMAN 5 % IV SOLN
INTRAVENOUS | Status: DC | PRN
  Administered 2012-05-08: 12:00:00 via INTRAVENOUS

## 2012-05-08 MED ORDER — OXYCODONE-ACETAMINOPHEN 5-325 MG PO TABS
1.0000 | ORAL_TABLET | ORAL | Status: DC | PRN
  Administered 2012-05-08 (×3): 2 via ORAL
  Filled 2012-05-08 (×4): qty 2

## 2012-05-08 MED ORDER — FENTANYL CITRATE 0.05 MG/ML IJ SOLN
INTRAMUSCULAR | Status: AC
  Filled 2012-05-08: qty 2

## 2012-05-08 MED ORDER — BISACODYL 5 MG PO TBEC: 5.0000 mg | DELAYED_RELEASE_TABLET | Freq: Every day | ORAL | Status: DC | PRN

## 2012-05-08 MED ORDER — LACTATED RINGERS IV SOLN
INTRAVENOUS | Status: DC | PRN
  Administered 2012-05-08 (×3): via INTRAVENOUS

## 2012-05-08 MED ORDER — OXYCODONE HCL 5 MG PO TABS
5.0000 mg | ORAL_TABLET | ORAL | Status: DC | PRN
  Administered 2012-05-08 (×3): 10 mg via ORAL
  Filled 2012-05-08 (×4): qty 2

## 2012-05-08 MED ORDER — PANTOPRAZOLE SODIUM 40 MG PO TBEC
40.0000 mg | DELAYED_RELEASE_TABLET | Freq: Every day | ORAL | Status: DC
  Administered 2012-05-13 – 2012-05-15 (×3): 40 mg via ORAL
  Filled 2012-05-08 (×5): qty 1

## 2012-05-08 MED ORDER — OXYCODONE-ACETAMINOPHEN 10-325 MG PO TABS: 1.0000 | ORAL_TABLET | ORAL | Status: DC | PRN

## 2012-05-08 MED ORDER — INFLUENZA VIRUS VACC SPLIT PF IM SUSP
0.5000 mL | INTRAMUSCULAR | Status: AC
  Filled 2012-05-08: qty 0.5

## 2012-05-08 MED ORDER — METOPROLOL TARTRATE 25 MG PO TABS
25.0000 mg | ORAL_TABLET | Freq: Every day | ORAL | Status: DC
  Administered 2012-05-09 – 2012-05-13 (×3): 25 mg via ORAL
  Filled 2012-05-08 (×6): qty 1

## 2012-05-08 MED ORDER — DIPHENHYDRAMINE HCL 12.5 MG/5ML PO ELIX: 12.5000 mg | ORAL_SOLUTION | Freq: Four times a day (QID) | ORAL | Status: DC | PRN

## 2012-05-08 MED ORDER — ONDANSETRON HCL 4 MG/2ML IJ SOLN
4.0000 mg | Freq: Four times a day (QID) | INTRAMUSCULAR | Status: DC | PRN
  Administered 2012-05-09: 4 mg via INTRAVENOUS

## 2012-05-08 MED ORDER — HYDROXYZINE PAMOATE 25 MG PO CAPS
25.0000 mg | ORAL_CAPSULE | Freq: Three times a day (TID) | ORAL | Status: DC | PRN
  Filled 2012-05-08: qty 1

## 2012-05-08 MED ORDER — METOCLOPRAMIDE HCL 5 MG PO TABS
5.0000 mg | ORAL_TABLET | Freq: Three times a day (TID) | ORAL | Status: DC | PRN
  Filled 2012-05-08: qty 2

## 2012-05-08 MED ORDER — NEOSTIGMINE METHYLSULFATE 1 MG/ML IJ SOLN
INTRAMUSCULAR | Status: DC | PRN
  Administered 2012-05-08: 4 mg via INTRAVENOUS

## 2012-05-08 MED ORDER — BUDESONIDE-FORMOTEROL FUMARATE 160-4.5 MCG/ACT IN AERO
2.0000 | INHALATION_SPRAY | Freq: Two times a day (BID) | RESPIRATORY_TRACT | Status: DC
  Administered 2012-05-08 – 2012-05-09 (×2): 2 via RESPIRATORY_TRACT
  Filled 2012-05-08: qty 6

## 2012-05-08 MED ORDER — SODIUM CHLORIDE 0.9 % IV SOLN
INTRAVENOUS | Status: DC | PRN
  Administered 2012-05-08: 12:00:00 via INTRAVENOUS

## 2012-05-08 MED ORDER — PROPOFOL 10 MG/ML IV BOLUS
INTRAVENOUS | Status: DC | PRN
  Administered 2012-05-08: 300 mg via INTRAVENOUS

## 2012-05-08 MED ORDER — ISOSORBIDE MONONITRATE ER 30 MG PO TB24
30.0000 mg | ORAL_TABLET | Freq: Every day | ORAL | Status: DC
  Administered 2012-05-08 – 2012-05-15 (×6): 30 mg via ORAL
  Filled 2012-05-08 (×8): qty 1

## 2012-05-08 MED ORDER — EPHEDRINE SULFATE 50 MG/ML IJ SOLN
INTRAMUSCULAR | Status: DC | PRN
  Administered 2012-05-08: 10 mg via INTRAVENOUS

## 2012-05-08 MED ORDER — ROCURONIUM BROMIDE 100 MG/10ML IV SOLN
INTRAVENOUS | Status: DC | PRN
  Administered 2012-05-08: 50 mg via INTRAVENOUS
  Administered 2012-05-08 (×4): 10 mg via INTRAVENOUS

## 2012-05-08 MED ORDER — ACETAMINOPHEN 10 MG/ML IV SOLN
INTRAVENOUS | Status: DC | PRN
  Administered 2012-05-08: 1000 mg via INTRAVENOUS

## 2012-05-08 MED ORDER — MORPHINE SULFATE 10 MG/ML IJ SOLN
INTRAMUSCULAR | Status: DC | PRN
  Administered 2012-05-08: 10 mg via INTRAVENOUS

## 2012-05-08 MED ORDER — ALBUTEROL SULFATE HFA 108 (90 BASE) MCG/ACT IN AERS
2.0000 | INHALATION_SPRAY | Freq: Four times a day (QID) | RESPIRATORY_TRACT | Status: DC | PRN
  Administered 2012-05-14: 2 via RESPIRATORY_TRACT
  Filled 2012-05-08 (×2): qty 6.7

## 2012-05-08 MED ORDER — SUCCINYLCHOLINE CHLORIDE 20 MG/ML IJ SOLN
INTRAMUSCULAR | Status: DC | PRN
  Administered 2012-05-08: 140 mg via INTRAVENOUS

## 2012-05-08 MED ORDER — FENTANYL CITRATE 0.05 MG/ML IJ SOLN
100.0000 ug | INTRAMUSCULAR | Status: DC | PRN
  Administered 2012-05-08: 50 ug via INTRAVENOUS

## 2012-05-08 MED ORDER — LIDOCAINE HCL 4 % MT SOLN
OROMUCOSAL | Status: DC | PRN
  Administered 2012-05-08: 4 mL via TOPICAL

## 2012-05-08 MED ORDER — ONDANSETRON HCL 4 MG/2ML IJ SOLN: 4.0000 mg | Freq: Once | INTRAMUSCULAR | Status: DC | PRN

## 2012-05-08 MED ORDER — LIDOCAINE HCL (CARDIAC) 20 MG/ML IV SOLN
INTRAVENOUS | Status: DC | PRN
  Administered 2012-05-08: 100 mg via INTRAVENOUS

## 2012-05-08 MED ORDER — NALOXONE HCL 0.4 MG/ML IJ SOLN: 0.4000 mg | INTRAMUSCULAR | Status: DC | PRN

## 2012-05-08 MED ORDER — FLEET ENEMA 7-19 GM/118ML RE ENEM: 1.0000 | ENEMA | Freq: Once | RECTAL | Status: AC | PRN

## 2012-05-08 MED ORDER — ACETAMINOPHEN 10 MG/ML IV SOLN
1000.0000 mg | Freq: Four times a day (QID) | INTRAVENOUS | Status: AC
  Filled 2012-05-08 (×4): qty 100

## 2012-05-08 MED ORDER — HYDROXYZINE HCL 25 MG PO TABS
25.0000 mg | ORAL_TABLET | Freq: Three times a day (TID) | ORAL | Status: DC | PRN
  Administered 2012-05-08 (×2): 25 mg via ORAL
  Filled 2012-05-08 (×2): qty 1

## 2012-05-08 MED ORDER — ACETAMINOPHEN 325 MG PO TABS: 650.0000 mg | ORAL_TABLET | Freq: Four times a day (QID) | ORAL | Status: DC | PRN

## 2012-05-08 MED ORDER — DEXTROSE-NACL 5-0.45 % IV SOLN: INTRAVENOUS | Status: DC

## 2012-05-08 MED ORDER — KCL IN DEXTROSE-NACL 20-5-0.45 MEQ/L-%-% IV SOLN
INTRAVENOUS | Status: DC
  Administered 2012-05-08: 16:00:00 via INTRAVENOUS
  Administered 2012-05-09: 125 mL/h via INTRAVENOUS
  Filled 2012-05-08 (×6): qty 1000

## 2012-05-08 MED ORDER — BUPIVACAINE-EPINEPHRINE PF 0.25-1:200000 % IJ SOLN
INTRAMUSCULAR | Status: DC | PRN
  Administered 2012-05-08: 20 mL

## 2012-05-08 MED ORDER — SODIUM CHLORIDE 0.9 % IJ SOLN: 9.0000 mL | INTRAMUSCULAR | Status: DC | PRN

## 2012-05-08 MED ORDER — ONDANSETRON HCL 4 MG PO TABS: 4.0000 mg | ORAL_TABLET | Freq: Four times a day (QID) | ORAL | Status: DC | PRN

## 2012-05-08 MED ORDER — PHENOL 1.4 % MT LIQD: 1.0000 | OROMUCOSAL | Status: DC | PRN

## 2012-05-08 MED ORDER — DIPHENHYDRAMINE HCL 50 MG/ML IJ SOLN: 12.5000 mg | Freq: Four times a day (QID) | INTRAMUSCULAR | Status: DC | PRN

## 2012-05-08 MED ORDER — PHENYLEPHRINE HCL 10 MG/ML IJ SOLN
INTRAMUSCULAR | Status: DC | PRN
  Administered 2012-05-08 (×5): 80 ug via INTRAVENOUS

## 2012-05-08 MED ORDER — METHOCARBAMOL 100 MG/ML IJ SOLN
500.0000 mg | Freq: Four times a day (QID) | INTRAVENOUS | Status: DC | PRN
  Administered 2012-05-08: 500 mg via INTRAVENOUS
  Filled 2012-05-08: qty 5

## 2012-05-08 MED ORDER — ACETAMINOPHEN 650 MG RE SUPP: 650.0000 mg | Freq: Four times a day (QID) | RECTAL | Status: DC | PRN

## 2012-05-08 MED ORDER — METOCLOPRAMIDE HCL 5 MG/ML IJ SOLN
5.0000 mg | Freq: Three times a day (TID) | INTRAMUSCULAR | Status: DC | PRN
  Administered 2012-05-09: 10 mg via INTRAVENOUS
  Filled 2012-05-08 (×2): qty 2

## 2012-05-08 MED ORDER — BUPIVACAINE-EPINEPHRINE PF 0.25-1:200000 % IJ SOLN
INTRAMUSCULAR | Status: AC
  Filled 2012-05-08: qty 30

## 2012-05-08 SURGICAL SUPPLY — 53 items
BLADE SAW SAG 73X25 THK (BLADE) ×1
BLADE SAW SGTL 18X1.27X75 (BLADE) IMPLANT
BLADE SAW SGTL 73X25 THK (BLADE) ×1 IMPLANT
BLADE SAW SGTL MED 73X18.5 STR (BLADE) IMPLANT
BRUSH FEMORAL CANAL (MISCELLANEOUS) IMPLANT
CLOTH BEACON ORANGE TIMEOUT ST (SAFETY) ×2 IMPLANT
COVER BACK TABLE 24X17X13 BIG (DRAPES) IMPLANT
COVER SURGICAL LIGHT HANDLE (MISCELLANEOUS) ×4 IMPLANT
DRAPE ORTHO SPLIT 77X108 STRL (DRAPES) ×1
DRAPE PROXIMA HALF (DRAPES) ×2 IMPLANT
DRAPE SURG ORHT 6 SPLT 77X108 (DRAPES) ×1 IMPLANT
DRAPE U-SHAPE 47X51 STRL (DRAPES) ×2 IMPLANT
DRILL BIT 7/64X5 (BIT) ×2 IMPLANT
DRSG MEPILEX BORDER 4X12 (GAUZE/BANDAGES/DRESSINGS) ×2 IMPLANT
DRSG MEPILEX BORDER 4X8 (GAUZE/BANDAGES/DRESSINGS) ×2 IMPLANT
DURAPREP 26ML APPLICATOR (WOUND CARE) ×2 IMPLANT
ELECT BLADE 4.0 EZ CLEAN MEGAD (MISCELLANEOUS)
ELECT REM PT RETURN 9FT ADLT (ELECTROSURGICAL) ×2
ELECTRODE BLDE 4.0 EZ CLN MEGD (MISCELLANEOUS) IMPLANT
ELECTRODE REM PT RTRN 9FT ADLT (ELECTROSURGICAL) ×1 IMPLANT
GAUZE XEROFORM 1X8 LF (GAUZE/BANDAGES/DRESSINGS) ×2 IMPLANT
GLOVE BIO SURGEON STRL SZ7 (GLOVE) ×2 IMPLANT
GLOVE BIO SURGEON STRL SZ7.5 (GLOVE) ×2 IMPLANT
GLOVE BIOGEL PI IND STRL 7.0 (GLOVE) ×1 IMPLANT
GLOVE BIOGEL PI IND STRL 8 (GLOVE) ×1 IMPLANT
GLOVE BIOGEL PI INDICATOR 7.0 (GLOVE) ×1
GLOVE BIOGEL PI INDICATOR 8 (GLOVE) ×1
GOWN PREVENTION PLUS XLARGE (GOWN DISPOSABLE) ×2 IMPLANT
GOWN STRL NON-REIN LRG LVL3 (GOWN DISPOSABLE) ×4 IMPLANT
HANDPIECE INTERPULSE COAX TIP (DISPOSABLE)
HOOD PEEL AWAY FACE SHEILD DIS (HOOD) ×4 IMPLANT
KIT BASIN OR (CUSTOM PROCEDURE TRAY) ×2 IMPLANT
KIT ROOM TURNOVER OR (KITS) ×2 IMPLANT
MANIFOLD NEPTUNE II (INSTRUMENTS) ×2 IMPLANT
NEEDLE 22X1 1/2 (OR ONLY) (NEEDLE) ×2 IMPLANT
NS IRRIG 1000ML POUR BTL (IV SOLUTION) ×2 IMPLANT
PACK TOTAL JOINT (CUSTOM PROCEDURE TRAY) ×2 IMPLANT
PAD ARMBOARD 7.5X6 YLW CONV (MISCELLANEOUS) ×4 IMPLANT
PASSER SUT SWANSON 36MM LOOP (INSTRUMENTS) ×2 IMPLANT
PRESSURIZER FEMORAL UNIV (MISCELLANEOUS) IMPLANT
SET HNDPC FAN SPRY TIP SCT (DISPOSABLE) IMPLANT
SUT ETHIBOND 2 V 37 (SUTURE) ×2 IMPLANT
SUT ETHILON 3 0 FSL (SUTURE) ×2 IMPLANT
SUT VIC AB 0 CTB1 27 (SUTURE) ×2 IMPLANT
SUT VIC AB 1 CTX 36 (SUTURE) ×1
SUT VIC AB 1 CTX36XBRD ANBCTR (SUTURE) ×1 IMPLANT
SUT VIC AB 2-0 CTB1 (SUTURE) ×2 IMPLANT
SYR CONTROL 10ML LL (SYRINGE) ×2 IMPLANT
TOWEL OR 17X24 6PK STRL BLUE (TOWEL DISPOSABLE) ×2 IMPLANT
TOWEL OR 17X26 10 PK STRL BLUE (TOWEL DISPOSABLE) ×2 IMPLANT
TOWER CARTRIDGE SMART MIX (DISPOSABLE) IMPLANT
TRAY FOLEY CATH 14FR (SET/KITS/TRAYS/PACK) IMPLANT
WATER STERILE IRR 1000ML POUR (IV SOLUTION) ×8 IMPLANT

## 2012-05-08 NOTE — Anesthesia Preprocedure Evaluation (Addendum)
Anesthesia Evaluation  Patient identified by MRN, date of birth, ID band Patient awake    Reviewed: Allergy & Precautions, H&P , NPO status , Patient's Chart, lab work & pertinent test results, reviewed documented beta blocker date and time   Airway Mallampati: III TM Distance: >3 FB   Mouth opening: Limited Mouth Opening  Dental  (+) Edentulous Upper and Edentulous Lower   Pulmonary pneumonia -, COPD COPD inhaler,    Pulmonary exam normal       Cardiovascular hypertension, Pt. on medications and Pt. on home beta blockers + CAD, + Cardiac Stents and +CHF Rhythm:regular Rate:Normal     Neuro/Psych PSYCHIATRIC DISORDERS    GI/Hepatic GERD-  Medicated,  Endo/Other    Renal/GU      Musculoskeletal   Abdominal Normal abdominal exam  (+)   Peds  Hematology   Anesthesia Other Findings   Reproductive/Obstetrics                          Anesthesia Physical Anesthesia Plan  ASA: III  Anesthesia Plan: General   Post-op Pain Management:    Induction: Intravenous  Airway Management Planned: Oral ETT  Additional Equipment:   Intra-op Plan:   Post-operative Plan: Extubation in OR  Informed Consent: I have reviewed the patients History and Physical, chart, labs and discussed the procedure including the risks, benefits and alternatives for the proposed anesthesia with the patient or authorized representative who has indicated his/her understanding and acceptance.   Dental advisory given  Plan Discussed with: CRNA, Anesthesiologist and Surgeon  Anesthesia Plan Comments:         Anesthesia Quick Evaluation

## 2012-05-08 NOTE — Progress Notes (Signed)
UR COMPLETED  

## 2012-05-08 NOTE — Interval H&P Note (Signed)
History and Physical Interval Note:  05/08/2012 10:15 AM  [name]  has presented today for surgery, with the diagnosis of AVASCULAR NECROSIS LEFT HIP  The various methods of treatment have been discussed with the patient and family. After consideration of risks, benefits and other options for treatment, the patient has consented to  Procedure(s) (LRB) with comments: TOTAL HIP ARTHROPLASTY (Left) as a surgical intervention .  The patient's history has been reviewed, patient examined, no change in status, stable for surgery.  I have reviewed the patient's chart and labs.  Questions were answered to the patient's satisfaction.     Nestor Lewandowsky

## 2012-05-08 NOTE — Op Note (Signed)
OPERATIVE REPORT    DATE OF PROCEDURE:  05/08/2012       PREOPERATIVE DIAGNOSIS:  AVASCULAR NECROSIS LEFT HIP                                                          POSTOPERATIVE DIAGNOSIS:  AVASCULAR NECROSIS LEFT HIP                                                           PROCEDURE:  L total hip arthroplasty using a 58 mm DePuy Pinnacle  Cup, [employer], 10-degree polyethylene liner index superior  and posterior, a +0 36 mm ceramic head, a 18x13x42x160 SROM stem, 18FXL Cone   SURGEON: Alleene Stoy J    ASSISTANT:   Mauricia Area, PA-C  (present throughout entire procedure and necessary for timely completion of the procedure)   ANESTHESIA: General BLOOD LOSS: 800 FLUID REPLACEMENT: 2000 crystalloid DRAINS: Foley Catheter URINE OUTPUT: 300cc COMPLICATIONS: none    INDICATIONS FOR PROCEDURE: A 56 y.o. year-old With  AVASCULAR NECROSIS LEFT HIP   for 3 years, x-rays show crescent sign with subchondral collapse of bone femoral head. Despite conservative measures with observation, anti-inflammatory medicine, narcotics, use of a cane, has severe unremitting pain and can ambulate only a few blocks before resting.  Patient desires elective L total hip arthroplasty to decrease pain and increase function. The risks, benefits, and alternatives were discussed at length including but not limited to the risks of infection, bleeding, nerve injury, stiffness, blood clots, the need for revision surgery, cardiopulmonary complications, among others, and they were willing to proceed.y have been discussed. Questions answered.     PROCEDURE IN DETAIL: The patient was identified by armband,  received preoperative IV antibiotics in the holding area at [facility], taken to the operating room , appropriate anesthetic monitors  were attached and general endotracheal anesthesia induced. Foley catheter was inserted. Pt was rolled into the R lateral decubitus position and fixed there with a  Stulberg Mark II pelvic clamp and the L lower extremity was then prepped and draped  in the usual sterile fashion from the ankle to the hemipelvis. A time-out  procedure was performed. The skin along the lateral hip and thigh  infiltrated with 10 mL of 0.5% Marcaine and epinephrine solution. We  then made a posterolateral approach to the hip. With a #10 blade, 24 cm  incision through skin and subcutaneous tissue down to the level of the  IT band. Small bleeders were identified and cauterized. IT band cut in  line with skin incision exposing the greater trochanter. A Cobra retractor was placed between the gluteus minimus and the superior hip joint capsule, and a spiked Cobra between the quadratus femoris and the inferior hip joint capsule. This isolated the short  external rotators and piriformis tendons. These were tagged with a #2 Ethibond  suture and cut off their insertion on the intertrochanteric crest. The posterior  capsule was then developed into an acetabular-based flap from Posterior Superior off of the acetabulum out over the femoral neck and back posterior inferior to the acetabular rim. This flap was  tagged with two #2 Ethibond sutures and retracted protecting the sciatic nerve. This exposed the arthritic femoral head and osteophytes. The hip was then flexed and internally rotated, dislocating the femoral head and a standard neck cut performed 1 fingerbreadth above the lesser trochanter.  A spiked Cobra was placed in the cotyloid notch and a Hohmann retractor was then used to lever the femur anteriorly off of the anterior pelvic column. A posterior-inferior wing retractor was placed at the junction of the acetabulum and the ischium completing the acetabular exposure.We then removed the peripheral osteophytes and labrum from the acetabulum. We then reamed the acetabulum up to 57 mm with basket reamers obtaining good coverage in all quadrants, irrigated out with normal  saline solution and  hammered into place a 58 mm pinnacle cup in 45  degrees of abduction and about 20 degrees of anteversion. More  peripheral osteophytes removed and a trial 10-degree liner placed with the  index superior-posterior. The hip was then flexed and internally rotated exposing the  proximal femur, which was entered with the initiating reamer. This reamer was used to punch through the large 4 x 6 cm metaphyseal osteochondroma of the proximal femur. We then penetrated the inferior wall the osteochondroma with a small long drill and obtained C-arm images to make sure they were still within the center of the femoral canal. We then reamed from 4-6-8 mm using reamers from the revision set, and staying in the canal of the femur. This was followed by  the axial reamers up to a 13.5 mm full depth and 14 mm partial depth. We then conically reamed to 89F to the correct depth for a 42 base neck. The calcar was milled to 89FXL. A trial cone and stem was inserted in the 25 degrees anteversion, with a +0 36mm trial head. Trial reduction was then performed and excellent stability was noted with at 90 of flexion with 60 of internal rotation and then full extension with maximal external rotation. The hip could not be dislocated in full extension. The knee could easily flex  to about 130 degrees. We also stretched the abductors at this point,  because of the preexisting adductor contractures. All trial components  were then removed. The acetabulum was irrigated out with normal saline  solution. A titanium Apex [facility] was then screwed into place  followed by a 10-degree polyethylene liner index superior-posterior. On  the femoral side a 89FXL ZTT1 cone was hammered into place, followed by a 18x13x160x42 SROM stem in 25 degrees of anteversion. At this point, a +0 36 mm ceramic head was  hammered on the stem. The hip was reduced. We checked our stability  one more time and found to be excellent. The wound was once again    thoroughly irrigated out with normal saline solution lavage. The  capsular flap and short external rotators were repaired back to the  intertrochanteric crest through drill holes with a #2 Ethibond suture.  The IT band was closed with running 1 Vicryl suture. The subcutaneous  tissue with 0 and 2-0 undyed Vicryl suture and the skin with running  interlocking 3-0 nylon suture. Dressing of Xeroform and Mepilex was  then applied. The patient was then unclamped, rolled supine, awaken extubated and taken to recovery room without difficulty in stable condition.   Nestor Lewandowsky 05/08/2012, 12:27 PM

## 2012-05-08 NOTE — Transfer of Care (Signed)
Immediate Anesthesia Transfer of Care Note  Patient: [name]  Procedure(s) Performed: Procedure(s) (LRB) with comments: TOTAL HIP ARTHROPLASTY (Left)  Patient Location: PACU  Anesthesia Type: General  Level of Consciousness: awake, alert  and oriented  Airway & Oxygen Therapy: Patient Spontanous Breathing and Patient connected to face mask oxygen  Post-op Assessment: Report given to PACU RN  Post vital signs: Reviewed and stable  Complications: No apparent anesthesia complications

## 2012-05-08 NOTE — Preoperative (Signed)
Beta Blockers   Reason not to administer Beta Blockers:Not Applicable 

## 2012-05-08 NOTE — Plan of Care (Signed)
Problem: Consults Goal: Diagnosis- Total Joint Replacement Primary Total Hip LEFT     

## 2012-05-09 ENCOUNTER — Inpatient Hospital Stay (HOSPITAL_COMMUNITY): Payer: Medicare Other

## 2012-05-09 DIAGNOSIS — G8918 Other acute postprocedural pain: Secondary | ICD-10-CM

## 2012-05-09 DIAGNOSIS — J9620 Acute and chronic respiratory failure, unspecified whether with hypoxia or hypercapnia: Secondary | ICD-10-CM

## 2012-05-09 DIAGNOSIS — N179 Acute kidney failure, unspecified: Secondary | ICD-10-CM | POA: Diagnosis not present

## 2012-05-09 DIAGNOSIS — J449 Chronic obstructive pulmonary disease, unspecified: Secondary | ICD-10-CM

## 2012-05-09 DIAGNOSIS — J690 Pneumonitis due to inhalation of food and vomit: Secondary | ICD-10-CM | POA: Diagnosis not present

## 2012-05-09 DIAGNOSIS — G9340 Encephalopathy, unspecified: Secondary | ICD-10-CM | POA: Diagnosis not present

## 2012-05-09 LAB — BLOOD GAS, ARTERIAL
Bicarbonate: 24.9 mEq/L — ABNORMAL HIGH (ref 20.0–24.0)
Patient temperature: 98.6
TCO2: 26.6 mmol/L (ref 0–100)
pH, Arterial: 7.284 — ABNORMAL LOW (ref 7.350–7.450)

## 2012-05-09 LAB — BASIC METABOLIC PANEL
BUN: 12 mg/dL (ref 6–23)
CO2: 29 mEq/L (ref 19–32)
Calcium: 9 mg/dL (ref 8.4–10.5)
Chloride: 96 mEq/L (ref 96–112)
Creatinine, Ser: 1.36 mg/dL — ABNORMAL HIGH (ref 0.50–1.35)
GFR calc Af Amer: 67 mL/min — ABNORMAL LOW (ref >90)
Glucose, Bld: 152 mg/dL — ABNORMAL HIGH (ref 70–99)
Glucose, Bld: 124 mg/dL — ABNORMAL HIGH (ref 70–99)
Potassium: 5.3 mEq/L — ABNORMAL HIGH (ref 3.5–5.1)
Sodium: 130 mEq/L — ABNORMAL LOW (ref 135–145)

## 2012-05-09 LAB — CBC
HCT: 32.3 % — ABNORMAL LOW (ref 39.0–52.0)
HCT: 28.2 % — ABNORMAL LOW (ref 39.0–52.0)
Hemoglobin: 10.9 g/dL — ABNORMAL LOW (ref 13.0–17.0)
MCHC: 33.7 g/dL (ref 30.0–36.0)
MCV: 94.4 fL (ref 78.0–100.0)
MCV: 92.5 fL (ref 78.0–100.0)
RBC: 3.05 MIL/uL — ABNORMAL LOW (ref 4.22–5.81)
RDW: 12.9 % (ref 11.5–15.5)
RDW: 12.7 % (ref 11.5–15.5)
WBC: 15.6 10*3/uL — ABNORMAL HIGH (ref 4.0–10.5)
WBC: 17.2 10*3/uL — ABNORMAL HIGH (ref 4.0–10.5)

## 2012-05-09 LAB — RAPID URINE DRUG SCREEN, HOSP PERFORMED
Benzodiazepines: POSITIVE — AB
Cocaine: NOT DETECTED
Opiates: POSITIVE — AB
Tetrahydrocannabinol: NOT DETECTED

## 2012-05-09 MED ORDER — FUROSEMIDE 10 MG/ML IJ SOLN
40.0000 mg | Freq: Once | INTRAMUSCULAR | Status: AC
  Administered 2012-05-09: 40 mg via INTRAVENOUS

## 2012-05-09 MED ORDER — SODIUM CHLORIDE 0.9 % IV SOLN: INTRAVENOUS | Status: DC

## 2012-05-09 MED ORDER — SODIUM CHLORIDE 0.9 % IV BOLUS (SEPSIS): 1000.0000 mL | Freq: Once | INTRAVENOUS | Status: DC

## 2012-05-09 MED ORDER — METOCLOPRAMIDE HCL 5 MG/ML IJ SOLN
10.0000 mg | Freq: Three times a day (TID) | INTRAMUSCULAR | Status: DC | PRN
  Filled 2012-05-09: qty 2

## 2012-05-09 MED ORDER — FENTANYL CITRATE 0.05 MG/ML IJ SOLN
50.0000 ug | INTRAMUSCULAR | Status: DC | PRN
  Administered 2012-05-09 – 2012-05-10 (×5): 100 ug via INTRAVENOUS
  Administered 2012-05-10: 50 ug via INTRAVENOUS
  Administered 2012-05-10 (×2): 100 ug via INTRAVENOUS
  Administered 2012-05-11: 200 ug via INTRAVENOUS
  Administered 2012-05-11 – 2012-05-12 (×10): 100 ug via INTRAVENOUS
  Administered 2012-05-12: 200 ug via INTRAVENOUS
  Administered 2012-05-13 (×2): 100 ug via INTRAVENOUS
  Administered 2012-05-13: 50 ug via INTRAVENOUS
  Filled 2012-05-09 (×10): qty 2
  Filled 2012-05-09: qty 4
  Filled 2012-05-09 (×6): qty 2
  Filled 2012-05-09: qty 4
  Filled 2012-05-09: qty 2
  Filled 2012-05-09: qty 4
  Filled 2012-05-09: qty 2

## 2012-05-09 MED ORDER — ALBUTEROL SULFATE (5 MG/ML) 0.5% IN NEBU
2.5000 mg | INHALATION_SOLUTION | RESPIRATORY_TRACT | Status: DC
  Administered 2012-05-09 – 2012-05-10 (×3): 2.5 mg via RESPIRATORY_TRACT
  Filled 2012-05-09 (×3): qty 0.5

## 2012-05-09 MED ORDER — PANTOPRAZOLE SODIUM 40 MG IV SOLR
40.0000 mg | INTRAVENOUS | Status: DC
  Administered 2012-05-09 – 2012-05-12 (×4): 40 mg via INTRAVENOUS
  Filled 2012-05-09 (×5): qty 40

## 2012-05-09 MED ORDER — ALPRAZOLAM 0.5 MG PO TABS
0.5000 mg | ORAL_TABLET | Freq: Three times a day (TID) | ORAL | Status: DC | PRN
Start: 2012-05-09 — End: 2012-05-10
  Administered 2012-05-10: 0.5 mg via ORAL
  Filled 2012-05-09: qty 1

## 2012-05-09 MED ORDER — ENOXAPARIN SODIUM 30 MG/0.3ML ~~LOC~~ SOLN
30.0000 mg | Freq: Two times a day (BID) | SUBCUTANEOUS | Status: DC
  Administered 2012-05-09 – 2012-05-10 (×2): 30 mg via SUBCUTANEOUS
  Filled 2012-05-09 (×3): qty 0.3

## 2012-05-09 MED ORDER — SODIUM CHLORIDE 0.9 % IV SOLN
INTRAVENOUS | Status: DC
  Administered 2012-05-09: 125 mL/h via INTRAVENOUS
  Administered 2012-05-09: 17:00:00 via INTRAVENOUS
  Administered 2012-05-09: 20 mL/h via INTRAVENOUS
  Administered 2012-05-11: 18:00:00 via INTRAVENOUS

## 2012-05-09 MED ORDER — PIPERACILLIN-TAZOBACTAM 3.375 G IVPB
3.3750 g | Freq: Three times a day (TID) | INTRAVENOUS | Status: DC
  Administered 2012-05-09 – 2012-05-11 (×5): 3.375 g via INTRAVENOUS
  Filled 2012-05-09 (×8): qty 50

## 2012-05-09 MED ORDER — FUROSEMIDE 10 MG/ML IJ SOLN
INTRAMUSCULAR | Status: AC
  Filled 2012-05-09: qty 4

## 2012-05-09 MED ORDER — PROCHLORPERAZINE EDISYLATE 5 MG/ML IJ SOLN
10.0000 mg | Freq: Four times a day (QID) | INTRAMUSCULAR | Status: DC | PRN
  Filled 2012-05-09: qty 2

## 2012-05-09 MED ORDER — SENNOSIDES-DOCUSATE SODIUM 8.6-50 MG PO TABS
1.0000 | ORAL_TABLET | Freq: Two times a day (BID) | ORAL | Status: DC
  Administered 2012-05-10 – 2012-05-15 (×8): 1 via ORAL
  Filled 2012-05-09 (×14): qty 1

## 2012-05-09 MED ORDER — DEXMEDETOMIDINE HCL IN NACL 200 MCG/50ML IV SOLN
0.4000 ug/kg/h | INTRAVENOUS | Status: DC
  Administered 2012-05-10: 1.2 ug/kg/h via INTRAVENOUS
  Administered 2012-05-10: 0.2 ug/kg/h via INTRAVENOUS
  Administered 2012-05-10: 0.7 ug/kg/h via INTRAVENOUS
  Filled 2012-05-09 (×3): qty 50

## 2012-05-09 MED ORDER — IPRATROPIUM BROMIDE 0.02 % IN SOLN
0.5000 mg | RESPIRATORY_TRACT | Status: DC
  Administered 2012-05-09 – 2012-05-10 (×3): 0.5 mg via RESPIRATORY_TRACT
  Filled 2012-05-09 (×3): qty 2.5

## 2012-05-09 MED ORDER — INSULIN ASPART 100 UNIT/ML ~~LOC~~ SOLN
2.0000 [IU] | SUBCUTANEOUS | Status: DC
  Administered 2012-05-09 – 2012-05-11 (×7): 2 [IU] via SUBCUTANEOUS

## 2012-05-09 MED ORDER — MORPHINE SULFATE 2 MG/ML IJ SOLN: 2.0000 mg | INTRAMUSCULAR | Status: DC | PRN

## 2012-05-09 NOTE — Progress Notes (Addendum)
Patient ID: [name], male   DOB: 10/07/1955, 56 y.o.   MRN: [id number] PATIENT ID: [name]  MRN: [id number]  DOB/AGE:  March 13, 1956 / 56 y.o.  1 Day Post-Op Procedure(s) (LRB): TOTAL HIP ARTHROPLASTY (Left)    PROGRESS NOTE Subjective: Patient is alert, oriented,no Nausea, no Vomiting, yes passing gas, no Bowel Movement. Taking PO well. Denies SOB, Chest or Calf Pain. Using Incentive Spirometer, PAS in place. Ambulate wbat Patient reports pain as 10 on 0-10 scale, has been on long term percocet.  Using morphine PCA, seems mildly narcotized  Objective: Vital signs in last 24 hours: Filed Vitals:   05/09/12 0000 05/09/12 0144 05/09/12 0410 05/09/12 0628  BP:    133/80  Pulse:    101  Temp:    98.5 F (36.9 C)  TempSrc:      Resp: 21 20 22 16   SpO2: 97% 98% 93% 94%      Intake/Output from previous day: I/O last 3 completed shifts: In: 2700 [I.V.:2450; IV Piggyback:250] Out: 1675 [Urine:925; Blood:750]   Intake/Output this shift:     LABORATORY DATA: No results found for this basename: WBC:2,HGB:2,HCT:2,PLT:2,NA:2,K:2,CL:2,CO2:2,BUN:2,CREATININE:2,GLUCOSE:2,GLUCAP:3,PT:2,INR:2,CALCIUM,2: in the last 72 hours  Examination: Neurologically intact ABD soft Neurovascular intact Sensation intact distally Intact pulses distally Dorsiflexion/Plantar flexion intact Incision: moderate drainage No cellulitis present Compartment soft} XR AP&Lat of hip shows well placed\fixed THA  Assessment:   1 Day Post-Op Procedure(s) (LRB): TOTAL HIP ARTHROPLASTY (Left) ADDITIONAL DIAGNOSIS:  morbid obesity  Plan: PT/OT WBAT, THA  posterior precautions, change dressing  DVT Prophylaxis: SCDx72 hrs, ASA 325 mg BID x 2 weeks  DISCHARGE PLAN: Skilled Nursing Facility/Rehab  DISCHARGE NEEDS: HHPT, HHRN, CPM, Walker and 3-in-1 comode seat

## 2012-05-09 NOTE — Progress Notes (Signed)
Physical Therapy Evaluation Patient Details Name: [name] MRN: [id number] DOB: 1956/05/15 Today's Date: 05/09/2012 Time: 4098-1191 PT Time Calculation (min): 40 min  PT Assessment / Plan / Recommendation Clinical Impression  Pt is a 56 y.o. M s/p posterior THA.  Pt will benefit from acute care PT to increase safety awareness, strength, balance, and endurance through exercises and ambulation.  Pt very impulsive and required max cueing for all activities.    PT Assessment  Patient needs continued PT services    Follow Up Recommendations  Skilled nursing facility    Barriers to Discharge Inaccessible home environment      Equipment Recommendations  Rolling walker with 5" wheels    Recommendations for Other Services     Frequency 7X/week    Precautions / Restrictions Precautions Precautions: Posterior Hip Restrictions Weight Bearing Restrictions: Yes LLE Weight Bearing: Weight bearing as tolerated   Pertinent Vitals/Pain Pt c/o pain in his L LE.      Mobility  Bed Mobility Bed Mobility: Supine to Sit;Sitting - Scoot to Edge of Bed Supine to Sit: 1: +2 Total assist Supine to Sit: Patient Percentage: 50% Sitting - Scoot to Edge of Bed: 1: +2 Total assist Sitting - Scoot to Edge of Bed: Patient Percentage: 50% Details for Bed Mobility Assistance: (A) for safety and maintaining hip precautions.  Max cues for hand placement and proper technique Transfers Transfers: Sit to Stand;Stand to Sit Sit to Stand: 1: +2 Total assist Sit to Stand: Patient Percentage: 60% Stand to Sit: 1: +2 Total assist Stand to Sit: Patient Percentage: 60% Details for Transfer Assistance: (A) with maintaining safety and hip precautions.  Max cues for hand placement and proper technique. Ambulation/Gait Ambulation/Gait Assistance: Not tested (comment)    Exercises     PT Diagnosis: Difficulty walking;Abnormality of gait;Generalized weakness;Acute pain  PT Problem List: Decreased  strength;Decreased range of motion;Decreased activity tolerance;Decreased balance;Decreased mobility;Decreased cognition;Decreased knowledge of use of DME;Decreased safety awareness;Decreased knowledge of precautions;Pain PT Treatment Interventions: DME instruction;Gait training;Stair training;Functional mobility training;Therapeutic activities;Therapeutic exercise;Balance training;Neuromuscular re-education;Patient/family education   PT Goals Acute Rehab PT Goals PT Goal Formulation: With patient Time For Goal Achievement: 05/16/12 Potential to Achieve Goals: Fair Pt will go Supine/Side to Sit: with supervision PT Goal: Supine/Side to Sit - Progress: Goal set today Pt will Sit at Edge of Bed: with supervision PT Goal: Sit at Edge Of Bed - Progress: Goal set today Pt will go Sit to Supine/Side: with supervision PT Goal: Sit to Supine/Side - Progress: Goal set today Pt will go Sit to Stand: with supervision PT Goal: Sit to Stand - Progress: Goal set today Pt will go Stand to Sit: with supervision PT Goal: Stand to Sit - Progress: Goal set today Pt will Ambulate: >150 feet PT Goal: Ambulate - Progress: Goal set today Pt will Perform Home Exercise Program: Independently PT Goal: Perform Home Exercise Program - Progress: Goal set today  Visit Information  Last PT Received On: 05/09/12 Assistance Needed: +2    Subjective Data  Subjective: The doctor told me that I would not be getting up for days. Patient Stated Goal: To be d/c to SNF   Prior Functioning  Home Living Lives With: Spouse Available Help at Discharge: Skilled Nursing Facility Type of Home: House Home Access: Level entry Home Layout: One level Bathroom Shower/Tub: Tub/shower unit;Curtain Firefighter: Standard Bathroom Accessibility: No Home Adaptive Equipment: Straight cane Prior Function Level of Independence: Independent with assistive device(s) Able to Take Stairs?: Yes Driving: Yes Vocation:  On  disability Communication Communication: No difficulties Dominant Hand: Right    Cognition  Overall Cognitive Status: Impaired Area of Impairment: Attention;Following commands;Safety/judgement Arousal/Alertness: Lethargic Orientation Level: Disoriented to;Time Behavior During Session: Agitated Current Attention Level: Selective Attention - Other Comments: Pt kept falling asleep during the evaluation questions and would start talking off topic every few minutes. Following Commands: Follows one step commands inconsistently Safety/Judgement: Decreased awareness of safety precautions;Impulsive;Decreased safety judgement for tasks assessed;Decreased awareness of need for assistance Safety/Judgement - Other Comments: Unaware of safety precautions and was impulsive to move from the bed to chair.    Extremity/Trunk Assessment     Balance    End of Session PT - End of Session Activity Tolerance: Treatment limited secondary to agitation;Patient limited by pain Patient left: in chair;with call bell/phone within reach Nurse Communication: Mobility status  GP     [name] 05/09/2012, 10:23 AM

## 2012-05-09 NOTE — Consult Note (Addendum)
Name: [name] MRN: [id number] DOB: Jul 28, 1956    LOS: 1  PCP is [name], MD Referring Provider:  Dr Lonia Blood of [facility] and Dr Turner Daniels of orthopedics  Reason for Referral:  Delirium, nausea, and Acute resp acidosis following morphine following Left hip surgery in obese, non-compliant   PULMONARY / CRITICAL CARE MEDICINE  HPI:  56 year old morbidly obese male, BMI 43 in 2009, prior etoh in 2009 per chart/last week per wife, prior coccaine in 2009 per chart, > 20 pack smoker, pack smoker, minimal non-obst CAD. Has hx of "Drug seeking behavior" , hx of poor medication understanding/non compliance with MD Visits mentioned in chart review. 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 Diagnosed to have Gold stage 2 copd (fev1 61%, ratio 60 on 04/22/2006 per chart review).  Has OSA but does not use CPAP. At baseline has class 3 dyspnea and chronic cough  He was currently admitted 05/08/2012 for LEft Hip surgery for avascular necrosis of hip and on 05/08/12 underwent L total hip arthroplasty using a 58 mm DePuy Pinnacle by DR Turner Daniels. Post operatively initially uneventful but on 05/08/12 was using morpine PCA aggressively > 20mg  along with percocet and vistaril and xanax and robaxin. The on 05/09/12 has used 15mg  morphine sulfate between 9am-6pm due to severe pain along with developing agitation, confusion  And intermittent agitation alternating with somnolence since early morning 05/09/12. Then on 05/09/12 pm developed nausea and vomit along with cough needing zofran and reglan. ABG showed mild-moderate respiratory acodsis. Labs showed worsening creatinine.  LAsix given. AXR showed gastric distension but NG attempts failed due to agitation.  PCCM then called and patient arranged for transfer to ICU on 05/09/12 pm  Note: difficult IV access.No narcan given. Similar episode to above at [facility] when right hip surgery done     Past Medical History  Diagnosis  Date  . COPD (chronic obstructive pulmonary disease)   . Hypertension   . Chronic back pain   . Anxiety disorder   . Hyperlipidemia   . Depression   . Obesity   . History of alcohol abuse   . History of cocaine abuse   . Pneumonia   . CHF (congestive heart failure)   . GERD (gastroesophageal reflux disease)   . Headache   . Arthritis    Past Surgical History  Procedure Date  . 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   . Hip surgery     rt  . Cholecystectomy   . Back surgery   . Nasal/sinus endoscopy    Prior to Admission medications   Medication Sig Start Date End Date Taking? Authorizing Provider  albuterol (PROVENTIL HFA;VENTOLIN HFA) 108 (90 BASE) MCG/ACT inhaler Inhale 2 puffs into the lungs every 6 (six) hours as needed. For shortness of breath   Yes Historical Provider, MD  ALPRAZolam Prudy Feeler) 1 MG tablet Take 1 mg by mouth 3 (three) times daily as needed. For anxiety   Yes Historical Provider, MD  amLODipine (NORVASC) 5 MG tablet Take 5 mg by mouth daily.     Yes Historical Provider, MD  budesonide-formoterol (SYMBICORT) 160-4.5 MCG/ACT inhaler Inhale 2 puffs into the lungs 2 (two) times daily.   Yes Historical Provider, MD  citalopram (CELEXA) 40 MG tablet Take 40 mg by mouth daily.     Yes Historical Provider,  MD  hydrochlorothiazide 25 MG tablet Take 25 mg by mouth daily. For blood pressure   Yes Historical Provider, MD  hydrOXYzine (VISTARIL) 25 MG capsule Take 25 mg by mouth 3 (three) times daily as needed. For itching   Yes Historical Provider, MD  isosorbide mononitrate (IMDUR) 30 MG 24 hr tablet Take 30 mg by mouth daily.     Yes Historical Provider, MD  metoprolol tartrate (LOPRESSOR) 25 MG tablet Take 25 mg by mouth daily.    Yes Historical Provider, MD  omeprazole (PRILOSEC) 20 MG capsule Take 20 mg by mouth daily.   Yes Historical  Provider, MD  oxyCODONE-acetaminophen (PERCOCET) 10-325 MG per tablet Take 1 tablet by mouth every 4 (four) hours as needed. For pain   Yes Historical Provider, MD  traZODone (DESYREL) 100 MG tablet Take 100 mg by mouth at bedtime.    Yes Historical Provider, MD  aspirin 81 MG tablet Take 81 mg by mouth daily.    Historical Provider, MD  nitroGLYCERIN (NITROSTAT) 0.4 MG SL tablet Place 0.4 mg under the tongue every 5 (five) minutes as needed. For chest pain    Historical Provider, MD   Allergies Allergies  Allergen Reactions  . Ativan (Lorazepam) Other (See Comments)    "out of mind"  . Dilaudid (Hydromorphone Hcl) Other (See Comments)    " out of mind"  . Haloperidol Decanoate     Family History Family History  Problem Relation Age of Onset  . Coronary artery disease      family h/o  . Heart attack      family h/o   Social History  reports that he has been smoking Cigarettes.  He has a 19.5 pack-year smoking history. He does not have any smokeless tobacco history on file. He reports that he does not drink alcohol or use illicit drugs.  Review Of Systems:  NEgative other than HPI  Events Since Admission: 05/08/2012 > Admit   Vital Signs: Temp:  [97.7 F (36.5 C)-99.5 F (37.5 C)] 99.5 F (37.5 C) (09/17 1600) Pulse Rate:  [82-111] 83  (09/17 1529) Resp:  [16-22] 20  (09/17 1529) BP: (127-156)/(60-95) 139/60 mmHg (09/17 1529) SpO2:  [90 %-100 %] 90 % (09/17 1600)  Physical Examination: General:  Obese Morbidly Neuro:  RASS +1 to +2.. Intermittently confused. Cries of pain HEENT:  Obese. Mallampatti class 4. Supple neck Neck:  PEERL + Cardiovascular:  S1S2+. Mild sinus tachycardia. No murmur Lungs:  Distant breath sounds. No wheeze. No distress Abdomen:  Obese, Distended. Bowel sounds + Musculoskeletal:  No cyanosis. No clubbing. No edema Skin:  TATTOOS+   Dg Hip Operative Left  05/08/2012  *RADIOLOGY REPORT*  Clinical Data: Avascular necrosis of the left hip.   Left total hip replacement.  OPERATIVE LEFT HIP  Comparison: None.  Findings: Intraoperative images demonstrate the drill bit in the central portion of the intramedullary canal of the proximal left femur.  Sclerotic bone lesion is seen in the proximal femoral shaft.  IMPRESSION: Drill bit appears in good position.   Original Report Authenticated By: Gwynn Burly, M.D.    Dg Pelvis Portable  05/08/2012  *RADIOLOGY REPORT*  Clinical Data: Postop total left hip arthroplasty.  PORTABLE PELVIS  Comparison: None  Findings: On the femoral and acetabular components are well seated. No complicating features are demonstrated.  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  The right hip prosthesis  is stable.  IMPRESSION: Well seated components of a total left hip arthroplasty without complicating features.   Original Report Authenticated By: P. Loralie Champagne, M.D.    Dg Chest Port 1 View  05/09/2012  *RADIOLOGY REPORT*  Clinical Data: Respiratory distress.  PORTABLE CHEST - 1 VIEW  Comparison: Two-view chest 05/03/2012.  Findings: Cardiac enlargement is exaggerated by low lung volumes. There is progressive right middle or lower lobe airspace disease, worrisome for developing pneumonia.  Mild pulmonary vascular congestion is now present.  IMPRESSION:  1.  Right to lower or more likely middle lobe airspace disease is worrisome for developing pneumonia. 2.  Cardiomegaly and progressive pulmonary vascular congestion.   Original Report Authenticated By: Jamesetta Orleans. MATTERN, M.D.    Dg Abd Portable 1v  05/09/2012  *RADIOLOGY REPORT*  Clinical Data: Vomiting.  Abdominal pain.  PORTABLE ABDOMEN - 1 VIEW  Comparison: Single view pelvis 05/08/2012.  Findings: There is marked gastric distention.  The bowel gas pattern is otherwise unremarkable.  Gas is present in both large and small bowel.  Although no definite free air  is seen, it is difficult to exclude free air on the basis of this single view. Recommend supine and upright to views of the abdomen or decubitus imaging if the patient cannot tolerate upright imaging.  IMPRESSION:  1.  Marked gastric distention. 2.  Normal gas pattern in the large and small bowel. 3.  Although no definite free air is present, upright or decubitus imaging would be useful for further evaluation.   Original Report Authenticated By: Jamesetta Orleans. MATTERN, M.D.      Principal Problem:  *Acute-on-chronic respiratory failure Active Problems:  OBESITY, MORBID  Avascular necrosis of femur head, left  Acute encephalopathy  Acute renal failure  Post-operative pain  COPD  HYPERCHOLESTEROLEMIA, PURE  HIATAL HERNIA  HISTORY OF ASBESTOS EXPOSURE  Aspiration pneumonia   ASSESSMENT AND PLAN  PULMONARY  Lab 05/09/12 1625  PHART 7.284*  PCO2ART 54.3*  PO2ART 62.5*  HCO3 24.9*  O2SAT 90.7   Ventilator Settings:    A:  Acute on CHronic Resp acidosis due to opioids, copd and untreated OSA in setting of morbid obesity P:   STart bipap depending on clinical status  CARDIOVASCULAR No results found for this basename: TROPONINI:5,LATICACIDVEN:5, O2SATVEN:5,PROBNP:5 in the last 168 hours ECG:  Nil acute  A: Prior normal cath but risk factors for CAD + P:  Serial troponin  RENAL  Lab 05/09/12 0545 05/03/12 1339  NA 129* 139  K 5.3* 3.7  CL 96 103  CO2 25 23  BUN 12 8  CREATININE 1.36* 0.87  CALCIUM 8.7 9.6  MG -- --  PHOS -- --   Intake/Output      09/16 0701 - 09/17 0700 09/17 0701 - 09/18 0700   P.O. 480 840   I.V. 3950 1200   IV Piggyback 250    Total Intake 4680 2040   Urine 925 1000   Blood 750    Total Output 1675 1000   Net +3005 +1040        Urine Occurrence  1 x    Foley:  05/08/2012   A:  Acute post operative renal failure with hyperkalemia (was on K containing fluids). HE is s/p lasix P:   FLuid bolus 1L and then maintenance fluids DC K  containing fluids (already done) Recheck bmet 10pm  GASTROINTESTINAL No results found for this basename: AST:5,ALT:5,ALKPHOS:5,BILITOT:5,PROT:5,ALBUMIN:5 in the last 168 hours  A:  Nause and VOmit 05/09/12 -  likely related to morphine. Got agitated placing NG for Gastric distension on AXR P:   REglan 10mg  Q8h prn - reduce dose if renal function gets worse Add compazine prn as well Keep NPO NG tube depending on his cooperation   HEMATOLOGIC  Lab 05/09/12 0545 05/03/12 1339  HGB 10.9* 14.6  HCT 32.3* 42.1  PLT 251 260  INR -- 0.91  APTT -- 31   A:  Post op anemia P:  PRBC for hgb < 7gm% only  INFECTIOUS  Lab 05/09/12 0545 05/03/12 1339  WBC 15.6* 9.9  PROCALCITON -- --   Cultures:  Antibiotics:   A:  No evidence of infection P:   Zosyn pr ortho Check PCT and lactate Monitor  ENDOCRINE No results found for this basename: GLUCAP:5 in the last 168 hours A:  Metabolic syndrome +   P:   Monitor ICU hyperglycemia protocol  NEUROLOGIC  A:  #Difficult to Control SEvere POst OP Pain   - Hx of chronic narcs, ETOH and prior substance abuse places him at risk for effective pain mgmt    #On 05/09/12  - Likely opioid induced delirium in setting of developing renal failure  P:   Rx fluid bolus Rotate PCA opioid to RN directed fentanyl  If delirum continues, consider precedex For now continue home robaxin and xanax  Prn and daily celexa  BEST PRACTICE / DISPOSITION Level of Care:  Move to ICU 05/09/12 Primary Service:  ORtho to PCCM on 05/09/12  Consultants:  Ortho Code Status:  Full Diet:  NPO DVT Px:  Lovenox SQ GI Px:  Protonix IV Skin Integrity:  Intact Social / Family:  Wife not at bedside    The patient is critically ill with multiple organ systems failure and requires high complexity decision making for assessment and support, frequent evaluation and titration of therapies, application of advanced monitoring technologies and extensive interpretation of  multiple databases.   Critical Care Time devoted to patient care services described in this note is  75  Minutes.  Dr. Kalman Shan, M.D., [facility] Pulmonary and Critical Care Medicine Staff Physician [facility] [place] [facility] Pager: [phone], If no answer or between  15:00h - 7:00h: call [phone]  05/09/2012 7:10 PM

## 2012-05-09 NOTE — Progress Notes (Signed)
MD notified regarding elevated K+ at 5.3.  Order obtained to switch fluids to NS @ 125 mL/hr and then [facility] when pt taking POs and voiding.  Also discussed patient's decrease in orientation, confusion, and decline in respiratory status.  MD aware.  Will continue to monitor.

## 2012-05-09 NOTE — Progress Notes (Signed)
[name] c/o severe pain during night, using Morphine PCA plus percocet and oxycodone. He was able to sleep for 3 hours at a time and waking up in between in severe pain. Pt assisted to sit edge of bed, attempted to get OOB but he had too much pain and refused. Pt constantly moved around in bed during the night unable to get comfortable. Around 0600 he began to c/o pain all over and shooting down his arms and "not feeling right". EKG obtained to r/o cardiac issues, results normal sinus rhythm. Pt sat up in bed, eating breakfast. States he feels better now but just continues to have severe pain all over. Will continue to monitor.

## 2012-05-09 NOTE — Progress Notes (Signed)
Pt. Admitted to 2101. Report called to me from [place], RN on 5 [place]. Pt. Is stable, but complaining of severe pain. He is alert and oriented. Pt. Has no IV access. Passed on in report to call IV team to get a PIV. Pt. Seems very agitated and unhappy with situation.

## 2012-05-09 NOTE — Progress Notes (Signed)
Called and updated Patient's wife, Raynelle Fanning.  She is aware of patient's transfer to 2101.  Patient's wife reports a similar situation immediately following surgery on his right hip a couple years ago.  She reports severe hallucinations, agitation, and confusion for up to 3 weeks post operatively.  She advised RN she would come visit in the AM.  Pt transported to room 2101 via bed, on telemetry and 2LO2.  Rapid response RN and floor RN transported pt in stable condition.

## 2012-05-09 NOTE — Progress Notes (Signed)
ANTICOAGULATION CONSULT NOTE - Initial Consult  Pharmacy Consult for Lovenox Indication: DVT prophylaxis  Allergies  Allergen Reactions  . Ativan (Lorazepam) Other (See Comments)    "out of mind"  . Dilaudid (Hydromorphone Hcl) Other (See Comments)    " out of mind"  . Haloperidol Decanoate     Patient Measurements: Height: 6\' 3"  (190.5 cm) Weight: 343 lb 11.2 oz (155.9 kg) IBW/kg (Calculated) : 84.5  Body mass index is 42.96 kg/(m^2).   Vital Signs: Temp: 99.5 F (37.5 C) (09/17 1600) Temp src: Oral (09/17 1600) BP: 139/60 mmHg (09/17 1529) Pulse Rate: 90  (09/17 1900)  Labs:  Basename 05/09/12 0545  HGB 10.9*  HCT 32.3*  PLT 251  APTT --  LABPROT --  INR --  HEPARINUNFRC --  CREATININE 1.36*  CKTOTAL --  CKMB --  TROPONINI --    Estimated Creatinine Clearance: 99.3 ml/min (by C-G formula based on Cr of 1.36).   Medical History: Past Medical History  Diagnosis Date  . COPD (chronic obstructive pulmonary disease)   . Hypertension   . Chronic back pain   . Anxiety disorder   . Hyperlipidemia   . Depression   . Obesity   . History of alcohol abuse   . History of cocaine abuse   . Pneumonia   . CHF (congestive heart failure)   . GERD (gastroesophageal reflux disease)   . Headache   . Arthritis     Medications:  Scheduled:    . acetaminophen  1,000 mg Intravenous Q6H  . ipratropium  0.5 mg Nebulization Q4H   And  . albuterol  2.5 mg Nebulization Q4H  . amLODipine  5 mg Oral Daily  . aspirin EC  325 mg Oral BID  . citalopram  40 mg Oral Daily  . fentaNYL      . furosemide      . furosemide  40 mg Intravenous Once  . influenza  inactive virus vaccine  0.5 mL Intramuscular Tomorrow-1000  . insulin aspart  2-6 Units Subcutaneous Q4H  . isosorbide mononitrate  30 mg Oral Daily  . metoprolol tartrate  25 mg Oral Daily  . morphine      . pantoprazole  40 mg Oral Q1200  . pantoprazole (PROTONIX) IV  40 mg Intravenous Q24H  .  piperacillin-tazobactam (ZOSYN)  IV  3.375 g Intravenous Q8H  . senna-docusate  1 tablet Oral BID  . sodium chloride  1,000 mL Intravenous Once  . traZODone  100 mg Oral QHS  . DISCONTD: budesonide-formoterol  2 puff Inhalation BID  . DISCONTD: hydrochlorothiazide  25 mg Oral Daily  . DISCONTD: morphine   Intravenous Q4H    Assessment: DVT prophylaxis:  Patient is s/p left hip THA on 9/16 and is currently receiving ASA BID + SCDs for DVT prophylaxis.  With transfer to ICU additional pharmacologic prophylaxis is indicated.  In a medical patient we would use a higher dose of Lovenox for BMI >30 however in a post-surgical patient the risk of bleeding may outweigh the benefit and a standard dose approach will be used.  Goal of Therapy:  Prevention of VTE Monitor platelets by anticoagulation protocol: Yes   Plan:  Lovenox 30mg  SQ q12h until risk of bleed is lower, then may consider dose increase for BMI >30. Monitor CBC and renal function May consider holding BID aspirin while on Lovenox  Estella Husk, Pharm.D., BCPS Clinical Pharmacist  Phone [phone] Pager [phone] 05/09/2012, 7:53 PM

## 2012-05-09 NOTE — Evaluation (Signed)
Occupational Therapy Evaluation Patient Details Name: [name] MRN: [id number] DOB: 06-Oct-1955 Today's Date: 05/09/2012 Time: 0865-7846 OT Time Calculation (min): 26 min  OT Assessment / Plan / Recommendation Clinical Impression  56 yo male s/p posterior Lt THP with decr arousal and cardiac symptoms. OT to follow acutely    OT Assessment  Patient needs continued OT Services    Follow Up Recommendations  Skilled nursing facility    Barriers to Discharge      Equipment Recommendations  Defer to next venue    Recommendations for Other Services    Frequency  Min 2X/week    Precautions / Restrictions Precautions Precautions: Posterior Hip Precaution Comments: Pt educated on 3 precautions and asked to immediately recall. pt unable to state any precautions or awareness of surgery.  Restrictions LLE Weight Bearing: Weight bearing as tolerated   Pertinent Vitals/Pain Rapid response present Vomiting 10/10   ADL  ADL Comments: Pt required arousal throughout session. Pt with slurred speech. Pt with focused attention through session. Pt requesting repositioning. Tech called to the room to assist with bed mobility due to pt request. Pt states "raise me UP" pt states "I need to throw up give me something to throw up" Pt then vomitted small bile amount into basin. RN called immediately to room due to patients symptoms appear cardiac. Pt requesting "help. something bad is happening." pt c/o SOB, chest pain, radiating pain in Rt UE and pain at Lt hip. Pt with RN addressing pt's vitals. OT asking orientation questions. Pt responds "I dont know" to all questions. Rapid response present    OT Diagnosis: Generalized weakness;Acute pain;Cognitive deficits  OT Problem List: Decreased strength;Decreased range of motion;Decreased activity tolerance;Impaired balance (sitting and/or standing);Decreased cognition;Decreased coordination;Decreased safety awareness;Decreased knowledge of use of DME or  AE;Decreased knowledge of precautions;Pain;Obesity OT Treatment Interventions:     OT Goals Acute Rehab OT Goals OT Goal Formulation: Patient unable to participate in goal setting Time For Goal Achievement: 05/23/12 Potential to Achieve Goals: Good ADL Goals Pt Will Transfer to Toilet: with mod assist;with DME;3-in-1 ADL Goal: Toilet Transfer - Progress: Goal set today Miscellaneous OT Goals Miscellaneous OT Goal #1: Pt will verbalzie posterior hip precautions as precursor to adls OT Goal: Miscellaneous Goal #1 - Progress: Goal set today Miscellaneous OT Goal #2: Pt will perform bed mobility Min (A) as precursor to adls OT Goal: Miscellaneous Goal #2 - Progress: Goal set today  Visit Information  Last OT Received On: 05/09/12 Assistance Needed: +2    Subjective Data  Subjective: " Please call my wife I need to see her something bad is about to happen" Patient Stated Goal: to talk to wife   Prior Functioning  Vision/Perception  Home Living Lives With: Spouse Available Help at Discharge: Skilled Nursing Facility Type of Home: House Home Access: Level entry Home Layout: One level Bathroom Shower/Tub: Forensic scientist: Standard Bathroom Accessibility: No Home Adaptive Equipment: Straight cane Prior Function Level of Independence: Independent with assistive device(s) Able to Take Stairs?: Yes Driving: Yes Vocation: On disability Communication Communication: No difficulties Dominant Hand: Right      Cognition  Overall Cognitive Status: Impaired Arousal/Alertness: Lethargic Orientation Level: Oriented X4 / Intact Behavior During Session: Lethargic Current Attention Level:  (aroused to focus)    Extremity/Trunk Assessment              End of Session OT - End of Session Activity Tolerance: Patient limited by fatigue Patient left: in bed;with call bell/phone within reach  Nurse Communication:  (RN present with Rapid response)  GO      Harrel Carina [facility] 05/09/2012, 3:33 PM Pager: [phone]

## 2012-05-09 NOTE — Progress Notes (Signed)
Agree with PT evaluation.  Levorn Oleski, PT DPT [phone]  

## 2012-05-09 NOTE — Progress Notes (Signed)
Notified by OT that pt. was complaining of SOB, chest pain, and pain radiating "all over". Pt stated he had pain "shooting down his arms" was having difficulty breathing. Pt was nauseated and began vomiting per OT.  Assessed pts. heart and lung sounds. Cardiac sounds were normal, breath sounds were diminished with slight expiratory wheezing. Elevated pts HOB to 90 degrees. Assessed vital signs and called rapid response RN. Stayed with patient and continued to monitor.

## 2012-05-09 NOTE — Consult Note (Signed)
[facility] Medical Consultation  READ BONELLI ZOX:[id number] DOB: [birth year] DOA: 05/08/2012 PCP: [name], MD   Requesting physician: Gean Birchwood  Date of consultation: 05/09/12 Reason for consultation: chest pain   Impression/Recommendations Principal Problem:  *Acute-on-chronic respiratory failure Active Problems:  HYPERCHOLESTEROLEMIA, PURE  OBESITY, MORBID  COPD  HIATAL HERNIA  HISTORY OF ASBESTOS EXPOSURE  Avascular necrosis of femur head, left  Aspiration pneumonia   1. Acute on chronic respiratory failure - suspect due to combination of aspiration pneumonia, obstructive sleep apnea, COPD, decreased lung compliance from an overdistended stomach, volume overload. Recommend ICU admission, nasogastric tube placement with decompression of the stomach, BiPAP if patient can tolerate, one dose of IV Lasix, empiric IV Zosyn. Pulmonary and critical care medicine consultation has been obtained.  2. chest pain-noncardiac-EKG is within normal limits. The patient had a normal cardiac catheterization 1 year ago.  Chief Complaint: here for hip replacement  HPI:  56 yo man admitted for an elective THR, according to family has a very low pain tolerance, he received a PCA morphine pump post op and oxycodone as needed. This AM RN noticed that the patient was less alert and that he vomited a few times. We were called to help with management.    Review of Systems:  Unobtainable due to patient obtundation   Past Medical History  Diagnosis Date  . COPD (chronic obstructive pulmonary disease)   . Hypertension   . Chronic back pain   . Anxiety disorder   . Hyperlipidemia   . Depression   . Obesity   . History of alcohol abuse   . History of cocaine abuse   . Pneumonia   . CHF (congestive heart failure)   . GERD (gastroesophageal reflux disease)   . Headache   . Arthritis    Past Surgical History  Procedure Date  . 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   . Hip surgery     rt  . Cholecystectomy   . Back surgery   . Nasal/sinus endoscopy    Social History:  reports that he has been smoking Cigarettes.  He has a 19.5 pack-year smoking history. He does not have any smokeless tobacco history on file. He reports that he does not drink alcohol or use illicit drugs.  Allergies  Allergen Reactions  . Ativan (Lorazepam) Other (See Comments)    "out of mind"  . Dilaudid (Hydromorphone Hcl) Other (See Comments)    " out of mind"  . Haloperidol Decanoate    Family History  Problem Relation Age of Onset  . Coronary artery disease      family h/o  . Heart attack      family h/o    Prior to Admission medications   Medication Sig Start Date End Date Taking? Authorizing Provider  albuterol (PROVENTIL HFA;VENTOLIN HFA) 108 (90 BASE) MCG/ACT inhaler Inhale 2 puffs into the lungs every 6 (six) hours as needed. For shortness of breath   Yes Historical Provider, MD  ALPRAZolam Prudy Feeler) 1 MG tablet Take 1 mg by mouth 3 (three) times daily as needed. For anxiety   Yes Historical Provider, MD  amLODipine (NORVASC) 5 MG tablet Take 5 mg by mouth daily.     Yes Historical Provider, MD  budesonide-formoterol (SYMBICORT) 160-4.5 MCG/ACT inhaler Inhale 2 puffs into the lungs 2 (two) times daily.   Yes Historical Provider, MD  citalopram (CELEXA) 40 MG tablet Take 40 mg by mouth daily.     Yes Historical Provider, MD  hydrochlorothiazide 25 MG tablet Take 25 mg by mouth daily. For blood pressure   Yes Historical Provider, MD  hydrOXYzine (VISTARIL) 25 MG capsule Take 25 mg by mouth 3 (three) times daily as needed. For itching   Yes Historical Provider, MD  isosorbide mononitrate (IMDUR) 30 MG 24 hr tablet Take 30 mg by mouth daily.     Yes Historical Provider, MD  metoprolol tartrate (LOPRESSOR) 25 MG tablet Take 25 mg by mouth daily.    Yes  Historical Provider, MD  omeprazole (PRILOSEC) 20 MG capsule Take 20 mg by mouth daily.   Yes Historical Provider, MD  oxyCODONE-acetaminophen (PERCOCET) 10-325 MG per tablet Take 1 tablet by mouth every 4 (four) hours as needed. For pain   Yes Historical Provider, MD  traZODone (DESYREL) 100 MG tablet Take 100 mg by mouth at bedtime.    Yes Historical Provider, MD  aspirin 81 MG tablet Take 81 mg by mouth daily.    Historical Provider, MD  nitroGLYCERIN (NITROSTAT) 0.4 MG SL tablet Place 0.4 mg under the tongue every 5 (five) minutes as needed. For chest pain    Historical Provider, MD   Physical Exam: Blood pressure 139/60, pulse 83, temperature 99.5 F (37.5 C), temperature source Oral, resp. rate 20, SpO2 90.00%. Filed Vitals:   05/09/12 1508 05/09/12 1517 05/09/12 1529 05/09/12 1600  BP: 156/95 156/95 139/60   Pulse: 111 111 83   Temp: 98 F (36.7 C)   99.5 F (37.5 C)  TempSrc:    Oral  Resp: 16  20   SpO2: 91%  92% 90%     General:  Arousable, but unable to stay awake for more than a few seconds  Eyes: 1 mm, reactive to light  ENT: Normal appearing external ears  Neck: Short thick, JVD  Cardiovascular: Regular, distant  Respiratory: Poor air movement, right base crackles, no wheezes  Abdomen: Distended, tympanitic, nontender, quiet  Skin: Tattoos, pale, dry  Musculoskeletal: Tender over the hip replacement surgical site  Psychiatric: Unable to test  Neurologic: Able to move all 4 extremities  Labs on Admission:  Basic Metabolic Panel:  Lab 05/09/12 1610 05/03/12 1339  NA 129* 139  K 5.3* 3.7  CL 96 103  CO2 25 23  GLUCOSE 152* 117*  BUN 12 8  CREATININE 1.36* 0.87  CALCIUM 8.7 9.6  MG -- --  PHOS -- --   Liver Function Tests: No results found for this basename: AST:5,ALT:5,ALKPHOS:5,BILITOT:5,PROT:5,ALBUMIN:5 in the last 168 hours No results found for this basename: LIPASE:5,AMYLASE:5 in the last 168 hours No results found for this basename:  AMMONIA:5 in the last 168 hours CBC:  Lab 05/09/12 0545 05/03/12 1339  WBC 15.6* 9.9  NEUTROABS -- 5.2  HGB 10.9* 14.6  HCT 32.3* 42.1  MCV 94.4 93.1  PLT 251 260   Cardiac Enzymes: No results found for this basename: CKTOTAL:5,CKMB:5,CKMBINDEX:5,TROPONINI:5 in the last 168 hours BNP: No components found with this basename: POCBNP:5 CBG: No results found for this basename: GLUCAP:5 in the last 168 hours  Radiological Exams on Admission: Dg Hip Operative Left  05/08/2012  *RADIOLOGY REPORT*  Clinical Data: Avascular necrosis of the left hip.  Left total hip replacement.  OPERATIVE LEFT HIP  Comparison: None.  Findings: Intraoperative images demonstrate the drill bit in the central portion of the intramedullary canal of the proximal left femur.  Sclerotic bone lesion  is seen in the proximal femoral shaft.  IMPRESSION: Drill bit appears in good position.   Original Report Authenticated By: Gwynn Burly, M.D.    Dg Pelvis Portable  05/08/2012  *RADIOLOGY REPORT*  Clinical Data: Postop total left hip arthroplasty.  PORTABLE PELVIS  Comparison: None  Findings: On the femoral and acetabular components are well seated. No complicating features are demonstrated.  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  The right hip prosthesis is stable.  IMPRESSION: Well seated components of a total left hip arthroplasty without complicating features.   Original Report Authenticated By: P. Loralie Champagne, M.D.    Dg Chest Port 1 View  05/09/2012  *RADIOLOGY REPORT*  Clinical Data: Respiratory distress.  PORTABLE CHEST - 1 VIEW  Comparison: Two-view chest 05/03/2012.  Findings: Cardiac enlargement is exaggerated by low lung volumes. There is progressive right middle or lower lobe airspace disease, worrisome for developing pneumonia.  Mild pulmonary vascular congestion is now present.  IMPRESSION:  1.  Right  to lower or more likely middle lobe airspace disease is worrisome for developing pneumonia. 2.  Cardiomegaly and progressive pulmonary vascular congestion.   Original Report Authenticated By: Jamesetta Orleans. MATTERN, M.D.    Dg Abd Portable 1v  05/09/2012  *RADIOLOGY REPORT*  Clinical Data: Vomiting.  Abdominal pain.  PORTABLE ABDOMEN - 1 VIEW  Comparison: Single view pelvis 05/08/2012.  Findings: There is marked gastric distention.  The bowel gas pattern is otherwise unremarkable.  Gas is present in both large and small bowel.  Although no definite free air is seen, it is difficult to exclude free air on the basis of this single view. Recommend supine and upright to views of the abdomen or decubitus imaging if the patient cannot tolerate upright imaging.  IMPRESSION:  1.  Marked gastric distention. 2.  Normal gas pattern in the large and small bowel. 3.  Although no definite free air is present, upright or decubitus imaging would be useful for further evaluation.   Original Report Authenticated By: Jamesetta Orleans. MATTERN, M.D.     EKG: Independently reviewed. Normal sinus rhythm, without ST changes  Time spent: One hour  Maclaine Ahola [facility] Pager [phone]  If 7PM-7AM, please contact night-coverage [email or web address] Password [facility] 05/09/2012, 6:11 PM

## 2012-05-09 NOTE — Clinical Documentation Improvement (Signed)
Abnormal Labs Clarification  THIS DOCUMENT IS NOT A PERMANENT PART OF THE MEDICAL RECORD  TO RESPOND TO THE THIS QUERY, FOLLOW THE INSTRUCTIONS BELOW:  1. If needed, update documentation for the patient's encounter via the notes activity.  2. Access this query again and click edit on the [employer].  3. After updating, or not, click F2 to complete all highlighted (required) fields concerning your review. Select "additional documentation in the medical record" OR "no additional documentation provided".  4. Click Sign note button.  5. The deficiency will fall out of your InBasket *Please let us know if you are not able to complete this workflow by phone or e-mail (listed below).  Please update your documentation within the medical record to reflect your response to this query.                                                                                   05/09/12  Dear Mauricia Area, PA-C Marton Redwood  In a better effort to capture your patient's severity of illness, reflect appropriate length of stay and utilization of resources, a review of the medical record has revealed the following indicators.    Based on your clinical judgment, please clarify and document in a progress note and/or discharge summary the clinical condition associated with the following supporting information:  In responding to this query please exercise your independent judgment.  The fact that a query is asked, does not imply that any particular answer is desired or expected.  Abnormal findings (laboratory, x-ray, pathologic, and other diagnostic results) are not coded and reported unless the physician indicates their clinical significance.   The medical record reflects the following clinical findings, please clarify the diagnostic and/or clinical significance:      Per op note patient had EBL of 800 ml, given 2000 ml of fluid replacement, H/H ( 14.6/42.1) 9/11 , H/H (10.9/32.3) 9/17, continues on IVF 2 125  ml /hr with CBC 's ordered daily.  Please consider secondary diagnosis if appropriate. Thank you     Possible Clinical Conditions?                                   Expected Acute Blood Loss Anemia  Acute Blood Loss Anemia  Acute on chronic blood loss anemia  Chronic blood loss anemia  Precipitous drop in Hematocrit   Other Condition              Cannot Clinically Determine   Treatment: Intra Op transfused 2000 ml crystalloid  Reviewed: additional documentation in the medical record   Thank You,  Leonette Most Addison  Clinical Documentation Specialist RN, BSN: Pager [phone] HIM Off # [phone]  Health Information Management [place]

## 2012-05-09 NOTE — Progress Notes (Signed)
CARE MANAGEMENT NOTE 05/09/2012  Patient:  [name], [name]   Account Number:  [id number]  Date Initiated:  05/09/2012  Documentation initiated by:  Vance Peper  Subjective/Objective Assessment:   56 yr old male s/p left total hip arthroplasty.     Action/Plan:   CM spoke with patient regarding home health and DME needs. Patient states he will be going to [facility] for shortterm rehab.Clinical social worker is aware.   Anticipated DC Date:  05/11/2012   Anticipated DC Plan:  SKILLED NURSING FACILITY  In-house referral  Clinical Social Worker      DC Planning Services  CM consult      Choice offered to / List presented to:             Status of service:  Completed, signed off Medicare Important Message given?   (If response is "NO", the following Medicare IM given date fields will be blank) Date Medicare IM given:   Date Additional Medicare IM given:    Discharge Disposition:  SKILLED NURSING FACILITY  Per UR Regulation:    If discussed at Long Length of Stay Meetings, dates discussed:    Comments:

## 2012-05-09 NOTE — Anesthesia Postprocedure Evaluation (Signed)
  Anesthesia Post-op Note  Patient: [name]  Procedure(s) Performed: Procedure(s) (LRB) with comments: TOTAL HIP ARTHROPLASTY (Left)  Patient Location: Nursing Unit  Anesthesia Type: General  Level of Consciousness: alert  and oriented  Airway and Oxygen Therapy: Patient Spontanous Breathing and Patient connected to nasal cannula oxygen  Post-op Pain: severe  Post-op Assessment: Post-op Vital signs reviewed, Patient's Cardiovascular Status Stable, Respiratory Function Stable, Patent Airway, No signs of Nausea or vomiting and Adequate PO intake  Post-op Vital Signs: Reviewed and stable  Complications: No apparent anesthesia complications

## 2012-05-09 NOTE — Clinical Social Work Placement (Signed)
     Clinical Social Work Department CLINICAL SOCIAL WORK PLACEMENT NOTE 05/09/2012  Patient:  [name], [name]  Account Number:  [id number] Admit date:  05/08/2012  Clinical Social Worker:  Lupita Leash Meilech Virts, BSW  Date/time:  05/09/2012 03:22 PM  Clinical Social Work is seeking post-discharge placement for this patient at the following level of care:   SKILLED NURSING   (*CSW will update this form in Epic as items are completed)   05/09/2012  Patient/family provided with [facility] Department of Clinical Social Works list of facilities offering this level of care within the geographic area requested by the patient (or if unable, by the patients family).  05/09/2012  Patient/family informed of their freedom to choose among providers that offer the needed level of care, that participate in Medicare, Medicaid or managed care program needed by the patient, have an available bed and are willing to accept the patient.  05/09/2012  Patient/family informed of [facility] ownership interest in [facility], as well as of the fact that they are under no obligation to receive care at this facility.  PASARR submitted to EDS on 05/09/2012 PASARR number received from EDS on   FL2 transmitted to all facilities in geographic area requested by pt/family on   FL2 transmitted to all facilities within larger geographic area on   Patient informed that his/her managed care company has contracts with or will negotiate with  certain facilities, including the following:     Patient/family informed of bed offers received:   Patient [facility] Physician recommends and patient chooses bed at    Patient to be transferred to [facility] on   Patient to be transferred to facility by Ambulance  Sharin Mons)  The following physician request were entered in Epic:   Additional Comments:

## 2012-05-10 ENCOUNTER — Encounter (HOSPITAL_COMMUNITY): Payer: Self-pay | Admitting: Orthopedic Surgery

## 2012-05-10 DIAGNOSIS — J690 Pneumonitis due to inhalation of food and vomit: Secondary | ICD-10-CM

## 2012-05-10 DIAGNOSIS — M87059 Idiopathic aseptic necrosis of unspecified femur: Principal | ICD-10-CM

## 2012-05-10 LAB — BLOOD GAS, ARTERIAL
Bicarbonate: 27.6 mEq/L — ABNORMAL HIGH (ref 20.0–24.0)
FIO2: 0.32 %
O2 Saturation: 98.6 %
Patient temperature: 97
pH, Arterial: 7.449 (ref 7.350–7.450)

## 2012-05-10 LAB — BASIC METABOLIC PANEL
CO2: 30 mEq/L (ref 19–32)
Chloride: 91 mEq/L — ABNORMAL LOW (ref 96–112)
Sodium: 128 mEq/L — ABNORMAL LOW (ref 135–145)

## 2012-05-10 LAB — CBC
HCT: 26 % — ABNORMAL LOW (ref 39.0–52.0)
Hemoglobin: 8.8 g/dL — ABNORMAL LOW (ref 13.0–17.0)
MCV: 91.9 fL (ref 78.0–100.0)
RBC: 2.83 MIL/uL — ABNORMAL LOW (ref 4.22–5.81)
WBC: 14.3 10*3/uL — ABNORMAL HIGH (ref 4.0–10.5)

## 2012-05-10 LAB — PROCALCITONIN: Procalcitonin: 0.18 ng/mL

## 2012-05-10 LAB — GLUCOSE, CAPILLARY: Glucose-Capillary: 132 mg/dL — ABNORMAL HIGH (ref 70–99)

## 2012-05-10 LAB — TROPONIN I: Troponin I: <0.3 ng/mL (ref <0.30)

## 2012-05-10 MED ORDER — VITAMIN B-1 100 MG PO TABS
100.0000 mg | ORAL_TABLET | Freq: Every day | ORAL | Status: DC
  Filled 2012-05-10: qty 1

## 2012-05-10 MED ORDER — SODIUM CHLORIDE 0.9 % IV SOLN
1.0000 mg | Freq: Once | INTRAVENOUS | Status: AC
  Administered 2012-05-10: 1 mg via INTRAVENOUS
  Filled 2012-05-10: qty 0.2

## 2012-05-10 MED ORDER — MIDAZOLAM HCL 2 MG/2ML IJ SOLN
INTRAMUSCULAR | Status: AC
  Administered 2012-05-10: 1 mg
  Filled 2012-05-10: qty 2

## 2012-05-10 MED ORDER — FOLIC ACID 1 MG PO TABS
1.0000 mg | ORAL_TABLET | Freq: Every day | ORAL | Status: DC
  Filled 2012-05-10: qty 1

## 2012-05-10 MED ORDER — RISPERIDONE 1 MG PO TABS
1.0000 mg | ORAL_TABLET | Freq: Once | ORAL | Status: AC
Start: 2012-05-10 — End: 2012-05-10
  Administered 2012-05-10: 1 mg via ORAL
  Filled 2012-05-10: qty 1

## 2012-05-10 MED ORDER — ADULT MULTIVITAMIN W/MINERALS CH
1.0000 | ORAL_TABLET | Freq: Every day | ORAL | Status: DC
  Administered 2012-05-13 – 2012-05-15 (×3): 1 via ORAL
  Filled 2012-05-10 (×6): qty 1

## 2012-05-10 MED ORDER — MIDAZOLAM HCL 2 MG/2ML IJ SOLN
1.0000 mg | INTRAMUSCULAR | Status: DC | PRN
  Administered 2012-05-10 – 2012-05-11 (×4): 2 mg via INTRAVENOUS
  Administered 2012-05-11: 1 mg via INTRAVENOUS
  Administered 2012-05-11 – 2012-05-13 (×8): 2 mg via INTRAVENOUS
  Filled 2012-05-10 (×12): qty 2

## 2012-05-10 MED ORDER — POTASSIUM CHLORIDE 10 MEQ/100ML IV SOLN
10.0000 meq | INTRAVENOUS | Status: AC
  Administered 2012-05-10 (×2): 10 meq via INTRAVENOUS
  Filled 2012-05-10 (×2): qty 100

## 2012-05-10 MED ORDER — ALBUTEROL SULFATE (5 MG/ML) 0.5% IN NEBU
2.5000 mg | INHALATION_SOLUTION | Freq: Four times a day (QID) | RESPIRATORY_TRACT | Status: DC
  Administered 2012-05-10 – 2012-05-15 (×16): 2.5 mg via RESPIRATORY_TRACT
  Filled 2012-05-10 (×19): qty 0.5

## 2012-05-10 MED ORDER — MIDAZOLAM HCL 2 MG/2ML IJ SOLN
1.0000 mg | Freq: Four times a day (QID) | INTRAMUSCULAR | Status: DC
  Administered 2012-05-10 – 2012-05-13 (×6): 1 mg via INTRAVENOUS
  Filled 2012-05-10 (×6): qty 2

## 2012-05-10 MED ORDER — HALOPERIDOL LACTATE 5 MG/ML IJ SOLN
INTRAMUSCULAR | Status: AC
  Filled 2012-05-10: qty 1

## 2012-05-10 MED ORDER — MIDAZOLAM HCL 2 MG/2ML IJ SOLN
INTRAMUSCULAR | Status: AC
  Filled 2012-05-10: qty 2

## 2012-05-10 MED ORDER — IPRATROPIUM BROMIDE 0.02 % IN SOLN
0.5000 mg | Freq: Four times a day (QID) | RESPIRATORY_TRACT | Status: DC
  Administered 2012-05-10 – 2012-05-15 (×16): 0.5 mg via RESPIRATORY_TRACT
  Filled 2012-05-10 (×19): qty 2.5

## 2012-05-10 MED ORDER — DEXMEDETOMIDINE HCL IN NACL 400 MCG/100ML IV SOLN
0.4000 ug/kg/h | INTRAVENOUS | Status: DC
  Administered 2012-05-10 (×2): 1.2 ug/kg/h via INTRAVENOUS
  Administered 2012-05-10: 1.5 ug/kg/h via INTRAVENOUS
  Filled 2012-05-10 (×2): qty 100

## 2012-05-10 MED ORDER — RISPERIDONE 1 MG PO TABS
1.0000 mg | ORAL_TABLET | Freq: Two times a day (BID) | ORAL | Status: DC
  Filled 2012-05-10 (×4): qty 1

## 2012-05-10 MED ORDER — FENTANYL CITRATE 0.05 MG/ML IJ SOLN
25.0000 ug | INTRAMUSCULAR | Status: DC | PRN
  Administered 2012-05-10: 25 ug via INTRAVENOUS
  Filled 2012-05-10 (×2): qty 2

## 2012-05-10 MED ORDER — MIDAZOLAM HCL 2 MG/2ML IJ SOLN
1.0000 mg | Freq: Once | INTRAMUSCULAR | Status: AC
  Administered 2012-05-10: 1 mg via INTRAMUSCULAR

## 2012-05-10 MED ORDER — THIAMINE HCL 100 MG/ML IJ SOLN
100.0000 mg | Freq: Every day | INTRAMUSCULAR | Status: DC
  Administered 2012-05-10 – 2012-05-13 (×4): 100 mg via INTRAVENOUS
  Filled 2012-05-10 (×4): qty 1

## 2012-05-10 MED ORDER — DEXMEDETOMIDINE HCL IN NACL 400 MCG/100ML IV SOLN
0.2000 ug/kg/h | INTRAVENOUS | Status: DC
  Administered 2012-05-10 – 2012-05-11 (×9): 1.5 ug/kg/h via INTRAVENOUS
  Administered 2012-05-11: 0.75 ug/kg/h via INTRAVENOUS
  Administered 2012-05-11 (×5): 1.5 ug/kg/h via INTRAVENOUS
  Administered 2012-05-11 (×2): 1.7 ug/kg/h via INTRAVENOUS
  Administered 2012-05-11 – 2012-05-12 (×10): 1.5 ug/kg/h via INTRAVENOUS
  Filled 2012-05-10 (×31): qty 100

## 2012-05-10 MED ORDER — MIDAZOLAM HCL 2 MG/2ML IJ SOLN
2.0000 mg | Freq: Four times a day (QID) | INTRAMUSCULAR | Status: DC
  Administered 2012-05-10: 2 mg via INTRAVENOUS
  Filled 2012-05-10: qty 2

## 2012-05-10 MED ORDER — DEXMEDETOMIDINE HCL IN NACL 200 MCG/50ML IV SOLN
0.2000 ug/kg/h | INTRAVENOUS | Status: DC
  Administered 2012-05-10: 1.7 ug/kg/h via INTRAVENOUS
  Administered 2012-05-10: 1.6 ug/kg/h via INTRAVENOUS
  Filled 2012-05-10 (×2): qty 50

## 2012-05-10 MED ORDER — ENOXAPARIN SODIUM 80 MG/0.8ML ~~LOC~~ SOLN
75.0000 mg | SUBCUTANEOUS | Status: DC
  Administered 2012-05-10 – 2012-05-14 (×5): 75 mg via SUBCUTANEOUS
  Filled 2012-05-10 (×6): qty 0.8

## 2012-05-10 NOTE — Consult Note (Signed)
Name: EMIGDIO WILDEMAN MRN: [id number] DOB: 1955-10-17    LOS: 2  PCP is [name], MD Referring Provider:  Dr Lonia Blood of [facility] and Dr Turner Daniels of orthopedics  Reason for Referral:  Delirium, nausea, and Acute resp acidosis following morphine following Left hip surgery in obese, non-compliant   PULMONARY / CRITICAL CARE MEDICINE  HPI:  56 year old morbidly obese male, BMI 43 in 2009, prior etoh in 2009 per chart/last week per wife, prior coccaine in 2009 per chart, > 20 pack smoker, pack smoker, minimal non-obst CAD. Has hx of "Drug seeking behavior" , hx of poor medication understanding/non compliance with MD Visits mentioned in chart review. 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 Diagnosed to have Gold stage 2 copd (fev1 61%, ratio 60 on 04/22/2006 per chart review).  Has OSA but does not use CPAP. At baseline has class 3 dyspnea and chronic cough  He was currently admitted 05/08/2012 for LEft Hip surgery for avascular necrosis of hip and on 05/08/12 underwent L total hip arthroplasty using a 58 mm DePuy Pinnacle by DR Turner Daniels. Post operatively initially uneventful but on 05/08/12 was using morpine PCA aggressively > 20mg  along with percocet and vistaril and xanax and robaxin. The on 05/09/12 has used 15mg  morphine sulfate between 9am-6pm due to severe pain along with developing agitation, confusion  And intermittent agitation alternating with somnolence since early morning 05/09/12. Then on 05/09/12 pm developed nausea and vomit along with cough needing zofran and reglan. ABG showed mild-moderate respiratory acodsis. Labs showed worsening creatinine.  LAsix given. AXR showed gastric distension but NG attempts failed due to agitation.  PCCM then called and patient arranged for transfer to ICU on 05/09/12 pm  Note: difficult IV access.No narcan given. Similar episode to above at [facility] when right hip surgery done  Events Since Admission: 05/08/2012 >  Admit 9/18- severe agitation, PIV out, precdex  Vital Signs: Temp:  [97.5 F (36.4 C)-99.5 F (37.5 C)] 98.1 F (36.7 C) (09/18 0814) Pulse Rate:  [60-111] 80  (09/18 1008) Resp:  [13-21] 16  (09/18 0700) BP: (86-156)/(29-112) 101/60 mmHg (09/18 1008) SpO2:  [90 %-100 %] 100 % (09/18 0700) FiO2 (%):  [40 %] 40 % (09/18 0009) Weight:  [155.9 kg (343 lb 11.2 oz)] 155.9 kg (343 lb 11.2 oz) (09/17 1900)  Physical Examination: General:  Obese Morbidly Neuro:  RASS 3, severe agiation HEENT:  Obese. Mallampatti class 4. Supple neck Neck:  PEERL + Cardiovascular:  S1S2+. Mild sinus tachycardia. No murmur Lungs:  Distant breath sounds Abdomen:  Obese, Distended. Bowel sounds + Musculoskeletal:  No cyanosis. No clubbing. No edema Skin:  TATTOOS+   Dg Hip Operative Left  05/08/2012  *RADIOLOGY REPORT*  Clinical Data: Avascular necrosis of the left hip.  Left total hip replacement.  OPERATIVE LEFT HIP  Comparison: None.  Findings: Intraoperative images demonstrate the drill bit in the central portion of the intramedullary canal of the proximal left femur.  Sclerotic bone lesion is seen in the proximal femoral shaft.  IMPRESSION: Drill bit appears in good position.   Original Report Authenticated By: Gwynn Burly, M.D.    Dg Pelvis Portable  05/08/2012  *RADIOLOGY REPORT*  Clinical Data: Postop total left hip arthroplasty.  PORTABLE PELVIS  Comparison: None  Findings: On the femoral and acetabular components are well seated. No complicating features are demonstrated.  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  The right hip prosthesis is stable.  IMPRESSION: Well seated components of a total left hip arthroplasty without complicating features.   Original Report Authenticated By: P. Loralie Champagne, M.D.    Dg Chest Port 1 View  05/09/2012  *RADIOLOGY REPORT*  Clinical Data: Respiratory  distress.  PORTABLE CHEST - 1 VIEW  Comparison: Two-view chest 05/03/2012.  Findings: Cardiac enlargement is exaggerated by low lung volumes. There is progressive right middle or lower lobe airspace disease, worrisome for developing pneumonia.  Mild pulmonary vascular congestion is now present.  IMPRESSION:  1.  Right to lower or more likely middle lobe airspace disease is worrisome for developing pneumonia. 2.  Cardiomegaly and progressive pulmonary vascular congestion.   Original Report Authenticated By: Jamesetta Orleans. MATTERN, M.D.    Dg Abd Portable 1v  05/09/2012  *RADIOLOGY REPORT*  Clinical Data: Vomiting.  Abdominal pain.  PORTABLE ABDOMEN - 1 VIEW  Comparison: Single view pelvis 05/08/2012.  Findings: There is marked gastric distention.  The bowel gas pattern is otherwise unremarkable.  Gas is present in both large and small bowel.  Although no definite free air is seen, it is difficult to exclude free air on the basis of this single view. Recommend supine and upright to views of the abdomen or decubitus imaging if the patient cannot tolerate upright imaging.  IMPRESSION:  1.  Marked gastric distention. 2.  Normal gas pattern in the large and small bowel. 3.  Although no definite free air is present, upright or decubitus imaging would be useful for further evaluation.   Original Report Authenticated By: Jamesetta Orleans. MATTERN, M.D.      Principal Problem:  *Acute-on-chronic respiratory failure Active Problems:  HYPERCHOLESTEROLEMIA, PURE  OBESITY, MORBID  COPD  HIATAL HERNIA  HISTORY OF ASBESTOS EXPOSURE  Avascular necrosis of femur head, left  Aspiration pneumonia  Acute encephalopathy  Acute renal failure  Post-operative pain   ASSESSMENT AND PLAN  PULMONARY  Lab 05/10/12 0624 05/09/12 1625  PHART 7.449 7.284*  PCO2ART 39.9 54.3*  PO2ART 75.7* 62.5*  HCO3 27.6* 24.9*  O2SAT 98.6 90.7   Ventilator Settings: Vent Mode:  [-]  FiO2 (%):  [40 %] 40 %  A:  Acute on CHronic  Resp acidosis due to opioids, copd and untreated OSA in setting of morbid obesity, r/;o edema P:   -STart bipap depending on clinical status, not tolerable or required now -pcxr reviewed, re assess in am  -Goal neg obtained -change to q6h treatments -abg improved, limit narcs when able  CARDIOVASCULAR  Lab 05/10/12 0311 05/09/12 2146 05/09/12 1957  TROPONINI <0.30 -- <0.30  LATICACIDVEN -- 1.4 --  PROBNP -- -- --   ECG:  Nil acute  A: Prior normal cath but risk factors for CAD + P:  Serial troponin - neg -tele  RENAL  Lab 05/10/12 0311 05/09/12 2145 05/09/12 0545 05/03/12 1339  NA 128* 130* 129* 139  K 3.7 4.3 -- --  CL 91* 92* 96 103  CO2 30 29 25 23   BUN 13 15 12 8   CREATININE 0.92 1.06 1.36* 0.87  CALCIUM 8.9 9.0 8.7 9.6  MG -- -- -- --  PHOS -- -- -- --   Intake/Output      09/17 0701 - 09/18 0700 09/18 0701 - 09/19 0700   P.O. 840    I.V. (mL/kg) 1471.8 (9.4)    IV Piggyback 72.5    Total Intake(mL/kg) 2384.3 (15.3)  Urine (mL/kg/hr) 3235 (0.9)    Blood     Total Output 3235    Net -850.7         Urine Occurrence 1 x     Foley:  05/08/2012   A:  Acute post operative renal failure with hyperkalemia (was on K containing fluids). HE is s/p lasix P:   Neg balance tolerated supp k  pcxr in am  Hold further lasix unless dest or delines  GASTROINTESTINAL No results found for this basename: AST:5,ALT:5,ALKPHOS:5,BILITOT:5,PROT:5,ALBUMIN:5 in the last 168 hours  A:  Nause and VOmit 05/09/12 - likely related to morphine. Got agitated placing NG for Gastric distension on AXR P:   REglan 10mg  Q8h prn  Clears improved  HEMATOLOGIC  Lab 05/10/12 0311 05/09/12 2145 05/09/12 0545 05/03/12 1339  HGB 8.8* 9.8* 10.9* 14.6  HCT 26.0* 28.2* 32.3* 42.1  PLT 197 217 251 260  INR -- -- -- 0.91  APTT -- -- -- 31   A:  Post op anemia, s/p hip P:  PRBC for hgb < 7gm% only Cbc in am after neg balance Lovenox, crt clearance in am  scd  INFECTIOUS  Lab  05/10/12 0311 05/09/12 2145 05/09/12 0545 05/03/12 1339  WBC 14.3* 17.2* 15.6* 9.9  PROCALCITON -- -- -- --   Cultures:  Antibiotics:   A:  No evidence of infection, r/o asp P:   Zosyn pr ortho, consider short course and narrow Likely edema a component as well Monitor  ENDOCRINE  Lab 05/10/12 0708 05/10/12 0333 05/10/12 0002 05/09/12 1934  GLUCAP 118* 132* 121* 133*   A:  Metabolic syndrome +   P:   Monitor ICU hyperglycemia protocol  NEUROLOGIC  A:  #Difficult to Control SEvere POst OP Pain   - Hx of chronic narcs, ETOH and prior substance abuse places him at risk for effective pain mgmt    #On 05/09/12  - Likely opioid induced delirium in setting of developing renal failure  P:   Restarted precedex after PIV obtained, will need to increase past 1.2 max, he is tolerating fine so far, follow hemodynamics Had to give versed 1 mg IM  X 2 Benzo at home, continue to IV benzo with hold parameters Ensure thiamine, folic Ativan se listed, avoiding for now Consider oral rispirdal Add celexa home, avoid SSRI rebound trazadone  BEST PRACTICE / DISPOSITION Level of Care:  Move to ICU 05/09/12 Primary Service:  ORtho to PCCM on 05/09/12  Consultants:  Ortho Code Status:  Full Diet:  NPO DVT Px:  Lovenox SQ GI Px:  Protonix IV Skin Integrity:  Intact Social / Family:  Wife not at bedside  Ccm time 30 min   Mcarthur Rossetti. Tyson Alias, MD, FACP Pgr: [phone] [facility]

## 2012-05-10 NOTE — Progress Notes (Signed)
Patient refused to wear bipap, pulling it off saying he did not need to wear it.Patient alert, oriented x4. Patient placed on 4L [place]. Will continue to closely assess patient. Doree Albee

## 2012-05-10 NOTE — Progress Notes (Signed)
PT Cancellation Note  Treatment cancelled today due to per RN pt with increased Delirium and became combative with Nsg staff.  Pt now in restraints and per RN not following any directions.  Will hold PT at this time and check back tomorrow.    Sunny Schlein, [place] [phone] 05/10/2012, 9:44 AM

## 2012-05-10 NOTE — Clinical Social Work Psychosocial (Addendum)
    Clinical Social Work Department BRIEF PSYCHOSOCIAL ASSESSMENT 05/10/2012  Patient:  [name], [name]     Account Number:  [id number]     Admit date:  05/08/2012  Clinical Social Worker:  Burnard Hawthorne  Date/Time:  05/09/2012 03:18 PM  Referred by:  Physician  Date Referred:  05/09/2012 Referred for  SNF Placement   Other Referral:   Interview type:   Other interview type:    PSYCHOSOCIAL DATA Living Status:  WIFE Admitted from facility:   Level of care:   Primary support name:  [name]  [phone] Primary support relationship to patient:  SPOUSE Degree of support available:   Strong support    CURRENT CONCERNS Current Concerns  Post-Acute Placement   Other Concerns:    SOCIAL WORK ASSESSMENT / PLAN 56 year old male referred to CSW for Short term SNF placement Patient is requesting placement at [facility] [place] per nursing.  Spoke with Ceasar Mons Admissions Director of [facility]. She is aware of patient and states at bed will be available for patient when stable. FL2 wil be sent to facility.   Assessment/plan status:  Psychosocial Support/Ongoing Assessment of Needs Other assessment/ plan:   Information/referral to community resources:   SNF list not given as patient has prechosen facility that has a bed.    PATIENT'S/FAMILY'S RESPONSE TO PLAN OF CARE: Attempted to speak with patient this afternoon but a Rapid Response Code had been callled for patient. Nursing states patient will be transferred to ICU.  CSW was unable to interview patient or his wife- will report to receiving CSW for follow up.

## 2012-05-10 NOTE — Progress Notes (Signed)
Pt. Wore Bipap for a total of approximately 2.5 hours throughout the night. Pt. Refused several times when asked to wear it. He states his nasal cannula was enough oxygen.

## 2012-05-10 NOTE — Progress Notes (Signed)
Clinical Social Worker received referral to assist with dc planning. At this time, pt is not oriented.  CSW to continue to follow and assist as needed.   Angelia Mould, MSW, [place] [phone]

## 2012-05-10 NOTE — Progress Notes (Signed)
ANTICOAGULATION CONSULT NOTE - Follow-up  Pharmacy Consult for Lovenox Indication: DVT prophylaxis  Allergies  Allergen Reactions  . Ativan (Lorazepam) Other (See Comments)    "out of mind"  . Dilaudid (Hydromorphone Hcl) Other (See Comments)    " out of mind"  . Haloperidol Decanoate     Patient Measurements: Height: 6\' 3"  (190.5 cm) Weight: 343 lb 11.2 oz (155.9 kg) IBW/kg (Calculated) : 84.5  Body mass index is 42.96 kg/(m^2).   Vital Signs: Temp: 98.1 F (36.7 C) (09/18 0814) Temp src: Oral (09/18 0814) BP: 101/60 mmHg (09/18 1008) Pulse Rate: 80  (09/18 1008)  Labs:  Basename 05/10/12 0311 05/09/12 2145 05/09/12 1957 05/09/12 0545  HGB 8.8* 9.8* -- --  HCT 26.0* 28.2* -- 32.3*  PLT 197 217 -- 251  APTT -- -- -- --  LABPROT -- -- -- --  INR -- -- -- --  HEPARINUNFRC -- -- -- --  CREATININE 0.92 1.06 -- 1.36*  CKTOTAL -- -- -- --  CKMB -- -- -- --  TROPONINI <0.30 -- <0.30 --    Estimated Creatinine Clearance: 146.8 ml/min (by C-G formula based on Cr of 0.92).  Assessment: DVT prophylaxis:  Patient is s/p left hip THA on 9/16 and is currently receiving ASA BID + SCDs for DVT prophylaxis. Started on standard lovenox 30mg  BID after transfer from the ICU. However, d/t pt weight and BMI agreed to increase to 0.5mg /kg/day and DC BID asa. No bleeding noted  Goal of Therapy:  Prevention of VTE Monitor platelets by anticoagulation protocol: Yes   Plan:  1. Change lovenox to 75mg  SQ Q24H 2. DC aspirin 3. F/u CBC as ordered 4. Monitor for signs/symptoms of bleeding  Lysle Pearl, PharmD, BCPS Pager # [phone] 05/10/2012 11:28 AM

## 2012-05-10 NOTE — Progress Notes (Signed)
Patient was calm,cooperative throughout the night just requesting pain medicine for hip. At 0620am patient became very agitated and disruptive in care. Deterding MD gave order to increase precedex and ABG. Patient calmed down with precedex and pain medicine. At 0710 patient became extremely agitated and aggressive towards staff. Patient removed cardiac monitoring, iv, and foley.  Doree Albee

## 2012-05-10 NOTE — Progress Notes (Addendum)
PATIENT ID: [name]  MRN: [id number]  DOB/AGE:  1956-02-15 / 56 y.o.  2 Days Post-Op Procedure(s) (LRB): TOTAL HIP ARTHROPLASTY (Left)    PROGRESS NOTE Subjective: Awake, confused.  Transferred to ICU yesterday.  Patient reports pain as moderate  .    Objective: Vital signs in last 24 hours: Filed Vitals:   05/10/12 0400 05/10/12 0500 05/10/12 0600 05/10/12 0814  BP: 99/81  93/30   Pulse: 108 86 60   Temp:    98.1 F (36.7 C)  TempSrc:    Oral  Resp: 18 19 20    Height:      Weight:      SpO2: 96% 96% 94%       Intake/Output from previous day: I/O last 3 completed shifts: In: 4364.3 [P.O.:1320; I.V.:2971.8; IV Piggyback:72.5] Out: 3685 [Urine:3685]   Intake/Output this shift:     LABORATORY DATA:  Basename 05/10/12 0333 05/10/12 0311 05/10/12 0002 05/09/12 2145 05/09/12 1934  WBC -- 14.3* -- 17.2* --  HGB -- 8.8* -- 9.8* --  HCT -- 26.0* -- 28.2* --  PLT -- 197 -- 217 --  NA -- 128* -- 130* --  K -- 3.7 -- 4.3 --  CL -- 91* -- 92* --  CO2 -- 30 -- 29 --  BUN -- 13 -- 15 --  CREATININE -- 0.92 -- 1.06 --  GLUCOSE -- 112* -- 124* --  GLUCAP 132* -- 121* -- 133*  INR -- -- -- -- --  CALCIUM -- 8.9 -- -- --    Examination: Neurovascular intact Sensation intact distally Intact pulses distally Dorsiflexion/Plantar flexion intact Incision: scant drainage} XR AP&Lat of hip shows well placed\fixed THA  Assessment:   2 Days Post-Op Procedure(s) (LRB): TOTAL HIP ARTHROPLASTY (Left)  #2: Expected blood loss anemia  Plan: PT/OT WBAT, THA  posterior precautions  DVT Prophylaxis: SCDx72 hrs, Lovenox per medicine service  DISCHARGE PLAN: Skilled Nursing Facility/Rehab when stable  DISCHARGE NEEDS: HHPT, HHRN, Walker and 3-in-1 comode seat

## 2012-05-10 NOTE — Care Management Note (Signed)
    Page 1 of 1   05/10/2012     1:47:26 PM   CARE MANAGEMENT NOTE 05/10/2012  Patient:  [name], [name]   Account Number:  [id number]  Date Initiated:  05/09/2012  Documentation initiated by:  Vance Peper  Subjective/Objective Assessment:   56 yr old male s/p left total hip arthroplasty.     Action/Plan:   CM spoke with patient regarding home health and DME needs. Patient states he will be going to [facility] [place] for shortterm rehab.Clinical social worker is aware.   Anticipated DC Date:  05/11/2012   Anticipated DC Plan:  SKILLED NURSING FACILITY  In-house referral  Clinical Social Worker      DC Planning Services  CM consult      Choice offered to / List presented to:             Status of service:  Completed, signed off Medicare Important Message given?   (If response is "NO", the following Medicare IM given date fields will be blank) Date Medicare IM given:   Date Additional Medicare IM given:    Discharge Disposition:  SKILLED NURSING FACILITY  Per UR Regulation:    If discussed at Long Length of Stay Meetings, dates discussed:    Comments:  05-10-12 1:45pm Avie Arenas, RNBSN [phone] Patient tx to ICU due to requiring bipap, belligerent, hollering out.  Wife states this happened the last time he had hip surgery.  Plan still for ST rehab at SNF.   SW referral already placed.

## 2012-05-11 ENCOUNTER — Inpatient Hospital Stay (HOSPITAL_COMMUNITY): Payer: Medicare Other

## 2012-05-11 LAB — GLUCOSE, CAPILLARY
Glucose-Capillary: 112 mg/dL — ABNORMAL HIGH (ref 70–99)
Glucose-Capillary: 131 mg/dL — ABNORMAL HIGH (ref 70–99)
Glucose-Capillary: 92 mg/dL (ref 70–99)

## 2012-05-11 LAB — CBC
HCT: 25 % — ABNORMAL LOW (ref 39.0–52.0)
MCH: 31.9 pg (ref 26.0–34.0)
MCHC: 34.8 g/dL (ref 30.0–36.0)
MCV: 91.6 fL (ref 78.0–100.0)
Platelets: 183 10*3/uL (ref 150–400)
RDW: 12.5 % (ref 11.5–15.5)
WBC: 11.6 10*3/uL — ABNORMAL HIGH (ref 4.0–10.5)

## 2012-05-11 LAB — POCT I-STAT 3, ART BLOOD GAS (G3+)
Acid-Base Excess: 4 mmol/L — ABNORMAL HIGH (ref 0.0–2.0)
O2 Saturation: 98 %
TCO2: 29 mmol/L (ref 0–100)
pCO2 arterial: 40.2 mmHg (ref 35.0–45.0)

## 2012-05-11 LAB — MAGNESIUM: Magnesium: 2 mg/dL (ref 1.5–2.5)

## 2012-05-11 LAB — BASIC METABOLIC PANEL
BUN: 13 mg/dL (ref 6–23)
Calcium: 8.9 mg/dL (ref 8.4–10.5)
Chloride: 97 mEq/L (ref 96–112)
Creatinine, Ser: 0.92 mg/dL (ref 0.50–1.35)
GFR calc Af Amer: >90 mL/min (ref >90)
GFR calc non Af Amer: >90 mL/min (ref >90)

## 2012-05-11 LAB — TROPONIN I: Troponin I: <0.3 ng/mL (ref <0.30)

## 2012-05-11 MED ORDER — PRO-STAT SUGAR FREE PO LIQD
30.0000 mL | Freq: Two times a day (BID) | ORAL | Status: DC
  Administered 2012-05-13 – 2012-05-15 (×4): 30 mL
  Filled 2012-05-11 (×10): qty 30

## 2012-05-11 MED ORDER — DEXMEDETOMIDINE HCL IN NACL 400 MCG/100ML IV SOLN: 0.2000 ug/kg/h | INTRAVENOUS | Status: DC

## 2012-05-11 MED ORDER — OSMOLITE 1.2 CAL PO LIQD
1000.0000 mL | ORAL | Status: DC
  Filled 2012-05-11 (×6): qty 1000

## 2012-05-11 MED ORDER — OSMOLITE 1.2 CAL PO LIQD
1000.0000 mL | ORAL | Status: DC
  Filled 2012-05-11 (×2): qty 1000

## 2012-05-11 MED ORDER — RISPERIDONE 2 MG PO TABS
2.0000 mg | ORAL_TABLET | Freq: Two times a day (BID) | ORAL | Status: DC
  Administered 2012-05-12 – 2012-05-15 (×6): 2 mg via ORAL
  Filled 2012-05-11 (×10): qty 1

## 2012-05-11 NOTE — Progress Notes (Signed)
Speech Language Pathology  Patient Details Name: [name] MRN: [id number] DOB: [birth year] Today's Date: 05/11/2012 Time:  -    Received order for speech-language-cognitive eval.  Order clarified (verbally) from Dr.Feinstein that order is for swallow assessment.  RN reports pt. has been agitated this am and was given sedatives.  RN also stated he just placed an NGT, however MD stated in hall that pt. Doesn't need NGT.  RN is to clarify need for NGT.    SLP will attempt to return later this afternoon.  Breck Coons [place].Ed [employer] [phone]  05/11/2012

## 2012-05-11 NOTE — Consult Note (Signed)
Name: [name] MRN: [id number] DOB: 06-21-1956    LOS: 3  PCP is [name], MD Referring Provider:  Dr Lonia Blood of [facility] and Dr Turner Daniels of orthopedics  Reason for Referral:  Delirium, nausea, and Acute resp acidosis following morphine following Left hip surgery in obese, non-compliant   PULMONARY / CRITICAL CARE MEDICINE  HPI:  56 year old morbidly obese male, BMI 43 in 2009, prior etoh in 2009 per chart/last week per wife, prior coccaine in 2009 per chart, > 20 pack smoker, pack smoker, minimal non-obst CAD. Has hx of "Drug seeking behavior" , hx of poor medication understanding/non compliance with MD Visits mentioned in chart review. 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 Diagnosed to have Gold stage 2 copd (fev1 61%, ratio 60 on 04/22/2006 per chart review).  Has OSA but does not use CPAP. At baseline has class 3 dyspnea and chronic cough  He was currently admitted 05/08/2012 for LEft Hip surgery for avascular necrosis of hip and on 05/08/12 underwent L total hip arthroplasty using a 58 mm DePuy Pinnacle by DR Turner Daniels. Post operatively with severe delirium.  Events Since Admission: 05/08/2012 > Admit 9/18- severe agitation, PIV out, precdex 9/19- improved with precedex high dose  Vital Signs: Temp:  [97.9 F (36.6 C)-98.4 F (36.9 C)] 97.9 F (36.6 C) (09/19 0752) Pulse Rate:  [66-80] 70  (09/19 0800) Resp:  [20-28] 20  (09/19 0800) BP: (93-134)/(37-115) 110/52 mmHg (09/19 0800) SpO2:  [97 %-100 %] 100 % (09/19 0800)  Physical Examination: General:  Obese Morbidly Neuro:  RASS -2, int wakes up "f you" HEENT:  Obese. Mallampatti class 4. Supple neck Neck:  PEERL + Cardiovascular:  S1S2+. Mild sinus tachycardia. No murmur Lungs:  cta Abdomen:  Obese, Distended. Bowel sounds + Musculoskeletal:  No cyanosis. No clubbing. No edema Skin:  TATTOOS+   Dg Chest Port 1 View  05/09/2012  *RADIOLOGY REPORT*  Clinical Data:  Respiratory distress.  PORTABLE CHEST - 1 VIEW  Comparison: Two-view chest 05/03/2012.  Findings: Cardiac enlargement is exaggerated by low lung volumes. There is progressive right middle or lower lobe airspace disease, worrisome for developing pneumonia.  Mild pulmonary vascular congestion is now present.  IMPRESSION:  1.  Right to lower or more likely middle lobe airspace disease is worrisome for developing pneumonia. 2.  Cardiomegaly and progressive pulmonary vascular congestion.   Original Report Authenticated By: Jamesetta Orleans. MATTERN, M.D.    Dg Abd Portable 1v  05/09/2012  *RADIOLOGY REPORT*  Clinical Data: Vomiting.  Abdominal pain.  PORTABLE ABDOMEN - 1 VIEW  Comparison: Single view pelvis 05/08/2012.  Findings: There is marked gastric distention.  The bowel gas pattern is otherwise unremarkable.  Gas is present in both large and small bowel.  Although no definite free air is seen, it is difficult to exclude free air on the basis of this single view. Recommend supine and upright to views of the abdomen or decubitus imaging if the patient cannot tolerate upright imaging.  IMPRESSION:  1.  Marked gastric distention. 2.  Normal gas pattern in the large and small bowel. 3.  Although no definite free air is present, upright or decubitus imaging would be useful for further evaluation.   Original Report Authenticated By: Jamesetta Orleans. MATTERN, M.D.      Principal Problem:  *Acute-on-chronic respiratory failure Active Problems:  HYPERCHOLESTEROLEMIA, PURE  OBESITY, MORBID  COPD  HIATAL HERNIA  HISTORY OF ASBESTOS EXPOSURE  Avascular  necrosis of femur head, left  Aspiration pneumonia  Acute encephalopathy  Acute renal failure  Post-operative pain   ASSESSMENT AND PLAN  PULMONARY  Lab 05/10/12 0624 05/09/12 1625  PHART 7.449 7.284*  PCO2ART 39.9 54.3*  PO2ART 75.7* 62.5*  HCO3 27.6* 24.9*  O2SAT 98.6 90.7   Ventilator Settings:    A:  Acute on CHronic Resp acidosis due to  opioids, copd and untreated OSA in setting of morbid obesity, r/;o edema P:   -would avoid BIPAP in this current circumstance -pcxr reviewed, re assess in am  -Goal neg obtained -consider abg assessment  CARDIOVASCULAR  Lab 05/11/12 0417 05/10/12 2144 05/10/12 1357 05/10/12 0311 05/09/12 2146 05/09/12 1957  TROPONINI <0.30 <0.30 <0.30 <0.30 -- <0.30  LATICACIDVEN -- -- -- -- 1.4 --  PROBNP -- -- -- -- -- --   ECG:  Nil acute  A: Prior normal cath but risk factors for CAD + P:  Serial troponin - neg -tele -ensure kvo  RENAL  Lab 05/11/12 0417 05/10/12 0311 05/09/12 2145 05/09/12 0545  NA 134* 128* 130* 129*  K 4.0 3.7 -- --  CL 97 91* 92* 96  CO2 30 30 29 25   BUN 13 13 15 12   CREATININE 0.92 0.92 1.06 1.36*  CALCIUM 8.9 8.9 9.0 8.7  MG 2.0 -- -- --  PHOS 2.7 -- -- --   Intake/Output      09/18 0701 - 09/19 0700 09/19 0701 - 09/20 0700   P.O.     I.V. (mL/kg) 1814.4 (11.6) 85 (0.5)   IV Piggyback 122.5 12.5   Total Intake(mL/kg) 1936.9 (12.4) 97.5 (0.6)   Urine (mL/kg/hr) 2110 (0.6)    Total Output 2110    Net -173.1 +97.5        Urine Occurrence 1 x     Foley:  05/08/2012   A:  Acute post operative renal failure with hyperkalemia improved, neg on own P:   Neg balance tolerated Chem in am  kvo Consider T Fif able  GASTROINTESTINAL No results found for this basename: AST:5,ALT:5,ALKPHOS:5,BILITOT:5,PROT:5,ALBUMIN:5 in the last 168 hours  A:  Nause and Vomit 05/09/12 - likely related to morphine. Got agitated placing NG for Gastric distension on AXR P:   Gastric distention was likely from NIMV, now off No vomiting Consider NGT and startTF, asp precaution lft in am  With lower precedex, hope can feed? slp  HEMATOLOGIC  Lab 05/11/12 0417 05/10/12 0311 05/09/12 2145 05/09/12 0545  HGB 8.7* 8.8* 9.8* 10.9*  HCT 25.0* 26.0* 28.2* 32.3*  PLT 183 197 217 251  INR -- -- -- --  APTT -- -- -- --   A:  Post op anemia, s/p hip (high risk dvt) P:  PRBC for  hgb < 7gm% only Cbc in am after neg balance Lovenox increased for mass, crt in am  scd  INFECTIOUS  Lab 05/11/12 0417 05/10/12 2144 05/10/12 0311 05/09/12 2145 05/09/12 0545  WBC 11.6* -- 14.3* 17.2* 15.6*  PROCALCITON -- 0.18 -- -- --   Cultures:  Antibiotics:  A:  No evidence of infection, r/o asp P:   No evidence aspiration, no fevers Dc zosyn  ENDOCRINE  Lab 05/11/12 0748 05/11/12 0345 05/10/12 2342 05/10/12 1951 05/10/12 1612  GLUCAP 130* 124* 131* 128* 117*   A:  Metabolic syndrome + , controlled glu P:   Monitor ICU hyperglycemia protocol  NEUROLOGIC  A:  #Difficult to Control SEvere POst OP Pain   - Hx of chronic narcs, ETOH and  prior substance abuse places him at risk for effective pain mgmt    #On 05/09/12  - Likely opioid induced delirium in setting of developing renal failure  P:   precedex  At 1.5, goal to less 1.0 with increase other agents Benzo at home, continue to IV benzo with hold parameters thiamine, folic Ativan se listed, avoiding for now increase oral Risperdal in hopes to taper  celexa home, avoid SSRI rebound trazadone  BEST PRACTICE / DISPOSITION Level of Care:  Move to ICU 05/09/12 Primary Service:  ORtho to PCCM on 05/09/12  Consultants:  Ortho Code Status:  Full Diet:  NPO DVT Px:  Lovenox SQ GI Px:  Protonix IV Skin Integrity:  Intact Social / Family:  Wife not at bedside  Ccm time 30 min   Mcarthur Rossetti. Tyson Alias, MD, FACP Pgr: [phone] [facility]

## 2012-05-11 NOTE — Progress Notes (Signed)
PATIENT ID: [name]  MRN: [id number]  DOB/AGE:  09/18/1955 / 56 y.o.  3 Days Post-Op Procedure(s) (LRB): TOTAL HIP ARTHROPLASTY (Left)    PROGRESS NOTE Subjective: Subjective: Awake but drowsy and disoriented.  Does not respond appropriately to questioning.  Physical therapy on hold until mental status improves.    Objective: Vital signs in last 24 hours: Filed Vitals:   05/11/12 0700 05/11/12 0708 05/11/12 0752 05/11/12 0800  BP: 134/115   110/52  Pulse: 67   70  Temp:   97.9 F (36.6 C)   TempSrc:   Oral   Resp: 21   20  Height:      Weight:      SpO2: 100% 100%  100%      Intake/Output from previous day: I/O last 3 completed shifts: In: 2301.2 [I.V.:2106.2; IV Piggyback:195] Out: 4085 [Urine:4085]   Intake/Output this shift: Total I/O In: 97.5 [I.V.:85; IV Piggyback:12.5] Out: -    LABORATORY DATA:  Basename 05/11/12 0748 05/11/12 0417 05/11/12 0345 05/10/12 2342 05/10/12 0311  WBC -- 11.6* -- -- 14.3*  HGB -- 8.7* -- -- 8.8*  HCT -- 25.0* -- -- 26.0*  PLT -- 183 -- -- 197  NA -- 134* -- -- 128*  K -- 4.0 -- -- 3.7  CL -- 97 -- -- 91*  CO2 -- 30 -- -- 30  BUN -- 13 -- -- 13  CREATININE -- 0.92 -- -- 0.92  GLUCOSE -- 127* -- -- 112*  GLUCAP 130* -- 124* 131* --  INR -- -- -- -- --  CALCIUM -- 8.9 -- -- --    Examination: Intact pulses distally Incision: scant drainage} XR AP&Lat of hip shows well placed\fixed THA  Assessment:   3 Days Post-Op Procedure(s) (LRB): TOTAL HIP ARTHROPLASTY (Left) ADDITIONAL DIAGNOSIS:  delirium, expected blood loss anemia  Plan: PT/OT WBAT, THA  posterior precautions  DVT Prophylaxis: SCDx72 hrs, ASA 325 mg BID x 2 weeks  DISCHARGE PLAN: Skilled Nursing Facility/Rehab when stable.  DISCHARGE NEEDS: HHPT, Walker and 3-in-1 comode seat

## 2012-05-11 NOTE — Progress Notes (Signed)
INITIAL ADULT NUTRITION ASSESSMENT Date: 05/11/2012   Time: 10:55 AM  Reason for Assessment: MD Consult for TF initiation and management  INTERVENTION: Start Osmolite 1.2 at 20 ml/h, increase by 10 ml every 4 hours to goal rate of 75 ml/h with Prostat 30 ml BID to provide 2360 kcals, 130 grams protein, 1476 ml free water daily.   DOCUMENTATION CODES Per approved criteria  -Morbid Obesity    ASSESSMENT: Male 56 y.o.  Dx: Acute-on-chronic respiratory failure  Hx:  Past Medical History  Diagnosis Date  . COPD (chronic obstructive pulmonary disease)   . Hypertension   . Chronic back pain   . Anxiety disorder   . Hyperlipidemia   . Depression   . Obesity   . History of alcohol abuse   . History of cocaine abuse   . Pneumonia   . CHF (congestive heart failure)   . GERD (gastroesophageal reflux disease)   . Headache   . Arthritis     Past Surgical History  Procedure Date  . 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   . Hip surgery     rt  . Cholecystectomy   . Back surgery   . Nasal/sinus endoscopy   . Total hip arthroplasty 05/08/2012    Procedure: TOTAL HIP ARTHROPLASTY;  Surgeon: Nestor Lewandowsky, MD;  Location: [facility] OR;  Service: Orthopedics;  Laterality: Left;    Related Meds:  Scheduled Meds:   . albuterol  2.5 mg Nebulization Q6H   And  . ipratropium  0.5 mg Nebulization Q6H  . amLODipine  5 mg Oral Daily  . citalopram  40 mg Oral Daily  . enoxaparin (LOVENOX) injection  75 mg Subcutaneous Q24H  . folic acid (FOLVITE) IVPB  1 mg Intravenous Once  . haloperidol lactate      . insulin aspart  2-6 Units Subcutaneous Q4H  . isosorbide mononitrate  30 mg Oral Daily  . metoprolol tartrate  25 mg Oral Daily  . midazolam      . midazolam  1 mg Intramuscular Once  . midazolam  1 mg Intravenous Q6H  . multivitamin with minerals  1 tablet Oral  Daily  . pantoprazole  40 mg Oral Q1200  . pantoprazole (PROTONIX) IV  40 mg Intravenous Q24H  . potassium chloride  10 mEq Intravenous Q1 Hr x 2  . risperiDONE  2 mg Oral Q12H  . senna-docusate  1 tablet Oral BID  . sodium chloride  1,000 mL Intravenous Once  . thiamine  100 mg Intravenous Daily  . traZODone  100 mg Oral QHS  . DISCONTD: albuterol  2.5 mg Nebulization Q4H  . DISCONTD: aspirin EC  325 mg Oral BID  . DISCONTD: enoxaparin (LOVENOX) injection  30 mg Subcutaneous Q12H  . DISCONTD: folic acid  1 mg Oral Daily  . DISCONTD: ipratropium  0.5 mg Nebulization Q4H  . DISCONTD: midazolam  2 mg Intravenous Q6H  . DISCONTD: piperacillin-tazobactam (ZOSYN)  IV  3.375 g Intravenous Q8H  . DISCONTD: risperiDONE  1 mg Oral Q12H  . DISCONTD: thiamine  100 mg Oral Daily   Continuous Infusions:   . sodium chloride 20 mL/hr (05/09/12 2132)  . dexmedetomidine 1.5 mcg/kg/hr (05/11/12 1031)  . feeding supplement (OSMOLITE 1.2 CAL)    . DISCONTD: sodium chloride    . DISCONTD: dexmedetomidine 1.7 mcg/kg/hr (05/10/12 1140)  . DISCONTD: dexmedetomidine 1.5 mcg/kg/hr (05/10/12  1111)  . DISCONTD: dexmedetomidine     PRN Meds:.albuterol, fentaNYL, fentaNYL, midazolam, DISCONTD: ALPRAZolam, DISCONTD: methocarbamol (ROBAXIN) IV, DISCONTD: methocarbamol, DISCONTD: metoCLOPramide (REGLAN) injection, DISCONTD: metoCLOPramide (REGLAN) injection, DISCONTD: metoCLOPramide, DISCONTD: nitroGLYCERIN, DISCONTD: ondansetron (ZOFRAN) IV, DISCONTD: ondansetron, DISCONTD: prochlorperazine   Ht: 6\' 3"  (190.5 cm)  Wt: 343 lb 11.2 oz (155.9 kg)  Ideal Wt: 89.1 kg % Ideal Wt: 175%  Wt Readings from Last 10 Encounters:  05/09/12 343 lb 11.2 oz (155.9 kg)  05/09/12 343 lb 11.2 oz (155.9 kg)  05/03/12 323 lb 13.7 oz (146.9 kg)  02/18/12 322 lb (146.058 kg)  02/06/12 327 lb (148.326 kg)  11/29/11 312 lb (141.522 kg)  10/04/11 327 lb (148.326 kg)  07/09/11 327 lb (148.326 kg)  05/31/11 330 lb (149.687 kg)    12/18/08 319 lb 1.6 oz (144.743 kg)    Usual Wt: 323 lb % Usual Wt: 106%  Body mass index is 42.96 kg/(m^2).  Food/Nutrition Related Hx: No nutrition problems identified on admission nutrition screen.  Class 3, extreme obesity.  Labs:  CMP     Component Value Date/Time   NA 134* 05/11/2012 0417   K 4.0 05/11/2012 0417   CL 97 05/11/2012 0417   CO2 30 05/11/2012 0417   GLUCOSE 127* 05/11/2012 0417   BUN 13 05/11/2012 0417   CREATININE 0.92 05/11/2012 0417   CALCIUM 8.9 05/11/2012 0417   PROT 6.6 11/04/2010 1340   ALBUMIN 3.4* 11/04/2010 1340   AST 21 11/04/2010 1340   ALT 20 11/04/2010 1340   ALKPHOS 47 11/04/2010 1340   BILITOT 0.3 11/04/2010 1340   GFRNONAA >90 05/11/2012 0417   GFRAA >90 05/11/2012 0417    CBG (last 3)   Basename 05/11/12 0748 05/11/12 0345 05/10/12 2342  GLUCAP 130* 124* 131*     Intake/Output Summary (Last 24 hours) at 05/11/12 1058 Last data filed at 05/11/12 0900  Gross per 24 hour  Intake 1895.33 ml  Output   2110 ml  Net -214.67 ml     Diet Order: NPO; Osmolite 1.2 ordered with goal rate of 40 ml/h to provide 1152 kcals, 53 grams protein, 787 ml free water daily.   IVF:    sodium chloride Last Rate: 20 mL/hr (05/09/12 2132)  dexmedetomidine Last Rate: 1.5 mcg/kg/hr (05/11/12 1031)  feeding supplement (OSMOLITE 1.2 CAL)   DISCONTD: sodium chloride   DISCONTD: dexmedetomidine Last Rate: 1.7 mcg/kg/hr (05/10/12 1140)  DISCONTD: dexmedetomidine Last Rate: 1.5 mcg/kg/hr (05/10/12 1111)  DISCONTD: dexmedetomidine     Estimated Nutritional Needs:   Kcal: 2300-2500 Protein: 130-145 grams Fluid: 2.3-2.5 liters  Patient was admitted with avascular necrosis of his left hip, S/P L total hip arthroplasty 9/16.  Transferred to the ICU 9/17 with delirium, nausea, and acute resp acidosis.  Received consult for TF initiation and management.  NG tube is in place.  Swallow evaluation has been ordered.  Patient is currently sedated, not  intubated.  NUTRITION DIAGNOSIS: -Inadequate oral intake (NI-2.1).  Status: Ongoing  RELATED TO: inability to eat  AS EVIDENCED BY: NPO status  MONITORING/EVALUATION(Goals): Goal:  Intake to meet >90% of estimated nutrition needs.  Monitor:  TF tolerance/adequacy, labs, weight trend, ability to start a PO diet.  EDUCATION NEEDS: -Education not appropriate at this time   Joaquin Courts, RD, LDN, CNSC Pager# [phone] After Hours Pager# [phone]  05/11/2012, 10:55 AM

## 2012-05-11 NOTE — Evaluation (Signed)
Clinical/Bedside Swallow Evaluation Patient Details  Name: [name] MRN: [id number] Date of Birth: 16-Oct-1955  Today's Date: 05/11/2012 Time: 4098-1191 SLP Time Calculation (min): 13 min  Past Medical History:  Past Medical History  Diagnosis Date  . COPD (chronic obstructive pulmonary disease)   . Hypertension   . Chronic back pain   . Anxiety disorder   . Hyperlipidemia   . Depression   . Obesity   . History of alcohol abuse   . History of cocaine abuse   . Pneumonia   . CHF (congestive heart failure)   . GERD (gastroesophageal reflux disease)   . Headache   . Arthritis    Past Surgical History:  Past Surgical History  Procedure Date  . 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   . Hip surgery     rt  . Cholecystectomy   . Back surgery   . Nasal/sinus endoscopy   . Total hip arthroplasty 05/08/2012    Procedure: TOTAL HIP ARTHROPLASTY;  Surgeon: Nestor Lewandowsky, MD;  Location: [facility] OR;  Service: Orthopedics;  Laterality: Left;   HPI:  56 yr old patient presents with a chief complaint of left hip pain that began approximately 8 months ago with no known mechanism of injury.  [name] has had his right hip replaced by another physician several months ago.  He reports that the right hip is doing very well.  He had an MRI scan in December of 2012 that showed bilateral AVN of the hips.  At that time he had collapse on the right side.  Recently his left hip pain has been worsening.  He is ambulating with a cane and using 10 mg Percocet for pain control.  PMH: BMI 43 in 2009, prior ETOH in 2009 per chart/last week per wife, prior coccaine in 2009 per chart, > 20 pack smoker, pack smoker, CAD. Has hx of "Drug seeking behavior" , hx of poor medication understanding/non compliance, COPD, OSA   Assessment / Plan / Recommendation Clinical Impression  Pt.  lethargic and drowsy following sedation this am for agitation.  Pt. is confused, frustrated and stating "take these off" (mitts).  Immediate cough thin water most likely resulting from premature spill/delayed swallow.  Prognosis for functional swallow is good once alertness and cognition improves.  Continue NPO and SLP will return next date to determine safety to initiate po's.      Aspiration Risk  Severe    Diet Recommendation NPO        Other  Recommendations Oral Care Recommendations: Oral care BID   Follow Up Recommendations   (to be determined)    Frequency and Duration min 2x/week  2 weeks        SLP Swallow Goals Goal #3: Pt. will demonstrate increased ability to sustain alertness/increased awareness to initiate swallow for safetst recommendation with mod verbal cues    Swallow Study Prior Functional Status          Oral/Motor/Sensory Function Overall Oral Motor/Sensory Function:  (uncooperative )   Ice Chips Ice chips: Impaired Presentation: Spoon Oral Phase Impairments: Reduced labial seal;Reduced lingual movement/coordination;Poor awareness of bolus;Impaired anterior to posterior transit Oral Phase Functional Implications: Prolonged oral transit;Oral residue Pharyngeal Phase Impairments: Decreased hyoid-laryngeal movement;Suspected delayed Swallow;Throat Clearing - Immediate;Cough - Delayed   Thin Liquid Thin Liquid: Impaired Presentation: Spoon Oral Phase Impairments: Reduced labial seal;Reduced lingual movement/coordination;Impaired anterior  to posterior transit;Poor awareness of bolus Oral Phase Functional Implications: Prolonged oral transit Pharyngeal  Phase Impairments: Suspected delayed Swallow;Decreased hyoid-laryngeal movement;Cough - Immediate    Nectar Thick Nectar Thick Liquid: Not tested   Honey Thick Honey Thick Liquid: Not tested   Puree Puree: Not tested   Solid       Solid: Not tested       Royce Macadamia M.Ed [employer]  [phone] 05/11/2012

## 2012-05-11 NOTE — Progress Notes (Signed)
Notified MD on call that I have been unsuccessful in placing an NG tube after 4 attempts.  While I was able to pass the NG tube on the first attempt, portable abdomen x-ray revealed it to not be in the stomach.  Second attempt did not pass into the stomach.  Subsequent two attempts unable to pass beyond the nasopharynx.  Patient combative in spite of Precedex at 1.7 mcg/kg/min, and bolus of 2 mg Versed IV.  Patient did not pass swallow evaluation this am and refuses to cooperate when asked to swallow. Unable to give scheduled P.O medications or initiate ordered enteral feed.

## 2012-05-11 NOTE — Progress Notes (Signed)
PT Cancellation Note  Treatment cancelled today due to pt now sedated.  Spoke with RN who notes pt sedated secondary to being confused and combative.  Will hold PT today.  Please clarify if pt is appropriate for PT and mobility at this time or if should sign off and await new order.  Thanks.    Sunny Schlein, [place] [phone] 05/11/2012, 8:51 AM

## 2012-05-11 NOTE — Progress Notes (Signed)
Clinical Social Worker submitted required information for [place] Prior Approval.  CSW to continue to follow and assist as needed.   Angelia Mould, MSW, [place] [phone]

## 2012-05-12 ENCOUNTER — Inpatient Hospital Stay (HOSPITAL_COMMUNITY): Payer: Medicare Other

## 2012-05-12 LAB — CBC WITH DIFFERENTIAL/PLATELET
Basophils Relative: 0 % (ref 0–1)
Eosinophils Absolute: 0.5 10*3/uL (ref 0.0–0.7)
HCT: 24.2 % — ABNORMAL LOW (ref 39.0–52.0)
Hemoglobin: 8.3 g/dL — ABNORMAL LOW (ref 13.0–17.0)
Lymphs Abs: 1.9 10*3/uL (ref 0.7–4.0)
MCH: 31.8 pg (ref 26.0–34.0)
MCHC: 34.3 g/dL (ref 30.0–36.0)
Monocytes Absolute: 1.6 10*3/uL — ABNORMAL HIGH (ref 0.1–1.0)
Monocytes Relative: 14 % — ABNORMAL HIGH (ref 3–12)
Neutrophils Relative %: 65 % (ref 43–77)
RBC: 2.61 MIL/uL — ABNORMAL LOW (ref 4.22–5.81)

## 2012-05-12 LAB — COMPREHENSIVE METABOLIC PANEL
ALT: 24 U/L (ref 0–53)
AST: 74 U/L — ABNORMAL HIGH (ref 0–37)
Albumin: 2.5 g/dL — ABNORMAL LOW (ref 3.5–5.2)
Calcium: 8.6 mg/dL (ref 8.4–10.5)
Creatinine, Ser: 0.83 mg/dL (ref 0.50–1.35)
Sodium: 133 mEq/L — ABNORMAL LOW (ref 135–145)
Total Protein: 5.9 g/dL — ABNORMAL LOW (ref 6.0–8.3)

## 2012-05-12 LAB — TROPONIN I
Troponin I: <0.3 ng/mL (ref <0.30)
Troponin I: <0.3 ng/mL (ref <0.30)

## 2012-05-12 LAB — GLUCOSE, CAPILLARY
Glucose-Capillary: 114 mg/dL — ABNORMAL HIGH (ref 70–99)
Glucose-Capillary: 114 mg/dL — ABNORMAL HIGH (ref 70–99)
Glucose-Capillary: 120 mg/dL — ABNORMAL HIGH (ref 70–99)

## 2012-05-12 MED ORDER — POTASSIUM CHLORIDE 10 MEQ/100ML IV SOLN
10.0000 meq | INTRAVENOUS | Status: AC
  Administered 2012-05-12 (×2): 10 meq via INTRAVENOUS
  Filled 2012-05-12: qty 200

## 2012-05-12 MED ORDER — ZIPRASIDONE MESYLATE 20 MG IM SOLR
20.0000 mg | Freq: Once | INTRAMUSCULAR | Status: AC
  Administered 2012-05-12: 20 mg via INTRAMUSCULAR
  Filled 2012-05-12: qty 20

## 2012-05-12 MED ORDER — FUROSEMIDE 10 MG/ML IJ SOLN
40.0000 mg | Freq: Two times a day (BID) | INTRAMUSCULAR | Status: AC
  Administered 2012-05-12 – 2012-05-13 (×4): 40 mg via INTRAVENOUS
  Filled 2012-05-12 (×4): qty 4

## 2012-05-12 NOTE — Progress Notes (Signed)
PT Cancellation Note  Treatment cancelled today due to per Dr Tyson Alias hold until Monday.  Will f/u Monday.    Sunny Schlein, [place] [phone] 05/12/2012, 11:29 AM

## 2012-05-12 NOTE — Progress Notes (Signed)
Attempted to see pt today. MD asked therapy to return to attempt on Monday as pt continues to be cognitively inappropriate for therapy at this time. Tory Emerald, [place] [phone]

## 2012-05-12 NOTE — Evaluation (Signed)
Clinical/Bedside Swallow Evaluation Patient Details  Name: [name] MRN: [id number] Date of Birth: 11/11/1955  Today's Date: 05/12/2012 Time: 1430-1510 SLP Time Calculation (min): 40 min  Past Medical History:  Past Medical History  Diagnosis Date  . COPD (chronic obstructive pulmonary disease)   . Hypertension   . Chronic back pain   . Anxiety disorder   . Hyperlipidemia   . Depression   . Obesity   . History of alcohol abuse   . History of cocaine abuse   . Pneumonia   . CHF (congestive heart failure)   . GERD (gastroesophageal reflux disease)   . Headache   . Arthritis    Past Surgical History:  Past Surgical History  Procedure Date  . 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   . Hip surgery     rt  . Cholecystectomy   . Back surgery   . Nasal/sinus endoscopy   . Total hip arthroplasty 05/08/2012    Procedure: TOTAL HIP ARTHROPLASTY;  Surgeon: Nestor Lewandowsky, MD;  Location: [facility] OR;  Service: Orthopedics;  Laterality: Left;   HPI:      Assessment / Plan / Recommendation Clinical Impression  Sedation has been held, and patient is more alert, but still poorly cooperative.  Patient had coughed bloody mucous across room, onto floor, refused applesauce and cracker, (spitting items he did not want).  Patient appears to tolerate thin liquids via straw without difficulty, and should be able to swallow solids as he becomes more alert and lucid.    Aspiration Risk  Mild    Diet Recommendation Thin liquid;Dysphagia 3 (Mechanical Soft)   Liquid Administration via: Straw Medication Administration: Whole meds with puree Supervision: Full supervision/cueing for compensatory strategies;Staff feed patient Compensations: Slow rate;Small sips/bites Postural Changes and/or Swallow Maneuvers: Seated upright 90 degrees    Other  Recommendations Oral Care  Recommendations: Oral care QID Other Recommendations: Clarify dietary restrictions (Carb modified)   Follow Up Recommendations   (TBD)    Frequency and Duration min 1 x/week  1 week   Pertinent Vitals/Pain C/o pain in right arm.    SLP Swallow Goals Patient will consume recommended diet without observed clinical signs of aspiration with: Supervision/safety;Set-up;Moderate assistance;Moderate cueing Patient will utilize recommended strategies during swallow to increase swallowing safety with: Supervision/safety;Set-up;Moderate assistance;Moderate cueing Goal #3: Pt. will demonstrate increased ability to sustain alertness/increased awareness to initiate swallow for safetst recommendation with mod verbal cues    Swallow Study Prior Functional Status       General Type of Study: Bedside swallow evaluation Previous Swallow Assessment: See am evaluatino. Diet Prior to this Study: NPO Temperature Spikes Noted: No Respiratory Status: Supplemental O2 delivered via (comment) History of Recent Intubation: Yes Length of Intubations (days):  (intubated for surgery) Date extubated: 05/09/12 Behavior/Cognition: Confused;Agitated;Impulsive;Uncooperative;Distractible;Requires cueing;Doesn't follow directions;Decreased sustained attention Oral Cavity - Dentition: Adequate natural dentition Self-Feeding Abilities: Total assist (Hands in restraints) Patient Positioning: Upright in bed Baseline Vocal Quality: Clear Volitional Cough: Cognitively unable to elicit Volitional Swallow: Unable to elicit    Oral/Motor/Sensory Function Overall Oral Motor/Sensory Function:  (unable to assess)   Ice Chips     Thin Liquid Thin Liquid: Within functional limits Presentation: Straw Other Comments: Much improved over am session.  Patient took large consecutive swallows of thin liquid via a straw without overt s/s of aspiration.    Nectar Thick Nectar Thick Liquid:  Not tested   Honey Thick Honey Thick Liquid:  Not tested   Puree Puree: Not tested (Patient refused.) Presentation: Spoon   Solid   GO    Solid: Within functional limits Presentation:  (Patient refused most solids presented.  Took bite of cookie)       [name] T 05/12/2012,3:36 PM

## 2012-05-12 NOTE — Progress Notes (Signed)
Clinical Social Worker spoke by phone with pt's wife, Raynelle Fanning [phone]).  CSW introduced self, explained role, and provided support.  CSW to continue to follow and assist as needed.    Angelia Mould, MSW, [place] [phone]

## 2012-05-12 NOTE — Progress Notes (Signed)
Speech Language Pathology Dysphagia Treatment Patient Details Name: [name] MRN: [id number] DOB: 18-Jun-1956 Today's Date: 05/12/2012 Time: 7829-5621 SLP Time Calculation (min): 23 min  Assessment / Plan / Recommendation Clinical Impression  RN reports patient requires continued sedation due to delirum, agitation, and combative behavior.  Patient is unable to complete any functional swallow evaluation in current sedated state.  Patient only accepted one ice chip orally, but did not initiate a swallow, and had positive cough response as if aspirating what was given.  Max stim was required to obtain even slight LOA for p.o. attemtps,.  Patient would not follow commands, and spit out applesauce and nectar cranberry juice.  Patient is not appropriate for further p.o. attempts until he can remain alert for at least 5-10 minutes and cooperate for evaluation.      Diet Recommendation  Continue with Current Diet: NPO    SLP Plan:  MD, please reorder BSE when patient is alert and able to cooperate in evaluation.  Discharge SLP treatment due to (comment) (decreased LOA.)   Pertinent Vitals/Pain n/a   Swallowing Goals     General Temperature Spikes Noted: No Respiratory Status: Supplemental O2 delivered via (comment) Behavior/Cognition: Confused;Impulsive;Uncooperative;Lethargic;Decreased sustained attention Oral Cavity - Dentition: Adequate natural dentition Patient Positioning: Upright in bed  Oral Cavity - Oral Hygiene Does patient have any of the following "at risk" factors?: Oxygen therapy - cannula, mask, simple oxygen devices Brush patient's teeth BID with toothbrush (using toothpaste with fluoride): Yes Patient is HIGH RISK - Oral Care Protocol followed (see row info): Yes Patient is AT RISK - Oral Care Protocol followed (see row info): Yes Patient is mechanically ventilated, follow VAP prevention protocol for oral care: Oral care provided every 4 hours   Dysphagia  Treatment Treatment focused on: Skilled observation of diet tolerance;Upgraded PO texture trials Treatment Methods/Modalities: Skilled observation Patient observed directly with PO's: Yes Type of PO's observed: Ice chips;Dysphagia 1 (puree);Nectar-thick liquids Feeding: Total assist;Refused PO Liquids provided via: Teaspoon Oral Phase Signs & Symptoms: Anterior loss/spillage;Other (comment) (Spitting applesauce and Nectar cranberry juice.) Pharyngeal Phase Signs & Symptoms: Immediate cough Type of cueing: Verbal Amount of cueing: Total (Max assist to awaken.  Cold cloth to face, sternal rub)   GO     Maryjo Rochester T 05/12/2012, 10:27 AM

## 2012-05-12 NOTE — Progress Notes (Signed)
PATIENT ID: [name]  MRN: [id number]  DOB/AGE:  54-Jan-1957 / 54 y.o.  4 Days Post-Op Procedure(s) (LRB): TOTAL HIP ARTHROPLASTY (Left)    PROGRESS NOTE Subjective:  Patient is resting comfortably this morning.  Nursing reports that he is still intermittently agitated.  Attempts at NG tube have been unsuccessful.  Physical therapy on hold until mental status improves.    Objective: Vital signs in last 24 hours: Filed Vitals:   05/12/12 0700 05/12/12 0732 05/12/12 0736 05/12/12 0800  BP: 104/60   109/51  Pulse: 65   70  Temp:   98.5 F (36.9 C)   TempSrc:   Oral   Resp: 20   19  Height:      Weight:      SpO2: 100% 100%  100%      Intake/Output from previous day: I/O last 3 completed shifts: In: 2862.4 [I.V.:2742.4; IV Piggyback:120] Out: 2461 [Urine:2461]   Intake/Output this shift:     LABORATORY DATA:  Basename 05/12/12 0734 05/12/12 0352 05/12/12 0249 05/11/12 2339 05/11/12 0417  WBC -- -- 11.2* -- 11.6*  HGB -- -- 8.3* -- 8.7*  HCT -- -- 24.2* -- 25.0*  PLT -- -- 215 -- 183  NA -- -- 133* -- 134*  K -- -- 3.8 -- 4.0  CL -- -- 98 -- 97  CO2 -- -- 25 -- 30  BUN -- -- 15 -- 13  CREATININE -- -- 0.83 -- 0.92  GLUCOSE -- -- 111* -- 127*  GLUCAP 120* 117* -- 114* --  INR -- -- -- -- --  CALCIUM -- -- 8.6 -- --    Examination: Incision: scant drainage} XR AP&Lat of hip shows well placed\fixed THA  Assessment:   4 Days Post-Op Procedure(s) (LRB): TOTAL HIP ARTHROPLASTY (Left) ADDITIONAL DIAGNOSIS:  Acute Blood Loss Anemia, delirium  Plan: PT/OT WBAT, THA  posterior precautions  DVT Prophylaxis: SCDx72 hrs, Lovenox per medicine service  DISCHARGE PLAN: Skilled Nursing Facility/Rehab when stable.  DISCHARGE NEEDS: HHPT, Walker and 3-in-1 comode seat

## 2012-05-12 NOTE — Consult Note (Signed)
Name: ALFONZA BRIESE MRN: [id number] DOB: [birth year]    LOS: 4  PCP is [name], MD Referring Provider:  Dr Lonia Blood of [facility] and Dr Turner Daniels of orthopedics  Reason for Referral:  Delirium, nausea, and Acute resp acidosis following morphine following Left hip surgery in obese, non-compliant   PULMONARY / CRITICAL CARE MEDICINE  HPI:  56 year old morbidly obese male, BMI 43 in 2009, prior etoh in 2009 per chart/last week per wife, prior coccaine in 2009 per chart, > 20 pack smoker, pack smoker, minimal non-obst CAD. Has hx of "Drug seeking behavior" , hx of poor medication understanding/non compliance with MD Visits mentioned in chart review. 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 Diagnosed to have Gold stage 2 copd (fev1 61%, ratio 60 on 04/22/2006 per chart review).  Has OSA but does not use CPAP. At baseline has class 3 dyspnea and chronic cough  He was currently admitted 05/08/2012 for LEft Hip surgery for avascular necrosis of hip and on 05/08/12 underwent L total hip arthroplasty using a 58 mm DePuy Pinnacle by DR Turner Daniels. Post operatively with severe delirium.  Events Since Admission: 05/08/2012 > Admit 9/18- severe agitation, PIV out, precdex 9/19- improved with precedex high dose 9/20- more oprganzied  Vital Signs: Temp:  [98.2 F (36.8 C)-98.9 F (37.2 C)] 98.5 F (36.9 C) (09/20 0736) Pulse Rate:  [65-80] 70  (09/20 0800) Resp:  [16-25] 19  (09/20 0800) BP: (90-122)/(36-86) 109/51 mmHg (09/20 0800) SpO2:  [96 %-100 %] 100 % (09/20 0800) Weight:  [150.7 kg (332 lb 3.7 oz)] 150.7 kg (332 lb 3.7 oz) (09/20 0500)  Physical Examination: General:  Obese Morbidly Neuro:  RASS -1, more organized, calmer HEENT:  Obese.Supple neck Neck:  PEERL + Cardiovascular:  S1S2+. No longer tachy rr Lungs:  cta Abdomen:  Obese, Distended. Bowel sounds + Musculoskeletal:  No cyanosis. Skin:  TATTOOS+   Dg Chest Port 1  View  05/12/2012  *RADIOLOGY REPORT*  Clinical Data: Congestion and shortness of breath.  PORTABLE CHEST - 1 VIEW  Comparison: Chest 05/09/2012 and 05/03/2012  Findings: There is cardiomegaly.  There is left perihilar and right basilar airspace disease.  No pneumothorax.  No pleural effusion.  IMPRESSION: Left perihilar and right basilar airspace disease could be due to asymmetric edema or pneumonia.   Original Report Authenticated By: Bernadene Bell. Maricela Curet, M.D.    Dg Abd Portable 1v  05/11/2012  *RADIOLOGY REPORT*  Clinical Data: NG tube placement.  PORTABLE ABDOMEN - 1 VIEW  Comparison: Single view of the abdomen 05/11/2012.  Findings: NG tube is not visualized. Gaseous distention of the colon noted.  IMPRESSION: No NG tube is visualized. Finding called to the patient's nurse, Fayrene Fearing, at the time of interpreation.   Original Report Authenticated By: Bernadene Bell. Maricela Curet, M.D.    Dg Abd Portable 1v  05/11/2012  *RADIOLOGY REPORT*  Clinical Data: Tube placement.  PORTABLE ABDOMEN - 1 VIEW  Comparison: 05/09/2012  Findings: There is no NG tube visible in the abdomen.  Gaseous distention of the stomach has resolved.  There is moderate gas in the colon and in the stomach but these are not significantly distended.  No acute osseous abnormality.  IMPRESSION: No visible NG tube.  Decreased air in the stomach.   Original Report Authenticated By: Gwynn Burly, M.D.      Principal Problem:  *Acute-on-chronic respiratory failure Active Problems:  HYPERCHOLESTEROLEMIA, PURE  OBESITY, MORBID  COPD  HIATAL HERNIA  HISTORY OF ASBESTOS EXPOSURE  Avascular necrosis of femur head, left  Aspiration pneumonia  Acute encephalopathy  Acute renal failure  Post-operative pain   ASSESSMENT AND PLAN  PULMONARY  Lab 05/11/12 0929 05/10/12 0624 05/09/12 1625  PHART 7.452* 7.449 7.284*  PCO2ART 40.2 39.9 54.3*  PO2ART 93.0 75.7* 62.5*  HCO3 28.1* 27.6* 24.9*  O2SAT 98.0 98.6 90.7   Ventilator Settings:     A:  Acute on CHronic Resp acidosis due to opioids, copd and untreated OSA in setting of morbid obesity, r/;o edema P:   Some edema on pcxr vs under penetration Pos balance, lasix to mild neg balance as goal IS when abel pcxr in am   CARDIOVASCULAR  Lab 05/12/12 0248 05/11/12 1822 05/11/12 1226 05/11/12 0417 05/10/12 2144 05/09/12 2146  TROPONINI <0.30 <0.30 <0.30 <0.30 <0.30 --  LATICACIDVEN -- -- -- -- -- 1.4  PROBNP -- -- -- -- -- --   ECG:  Nil acute  A: Prior normal cath but risk factors for CAD + P:  Tele while on dex lasix  RENAL  Lab 05/12/12 0249 05/11/12 0417 05/10/12 0311 05/09/12 2145 05/09/12 0545  NA 133* 134* 128* 130* 129*  K 3.8 4.0 -- -- --  CL 98 97 91* 92* 96  CO2 25 30 30 29 25   BUN 15 13 13 15 12   CREATININE 0.83 0.92 0.92 1.06 1.36*  CALCIUM 8.6 8.9 8.9 9.0 8.7  MG -- 2.0 -- -- --  PHOS -- 2.7 -- -- --   Intake/Output      09/19 0701 - 09/20 0700 09/20 0701 - 09/21 0700   I.V. (mL/kg) 1804.8 (12)    IV Piggyback 22.5    Total Intake(mL/kg) 1827.3 (12.1)    Urine (mL/kg/hr) 1101 (0.3)    Total Output 1101    Net +726.3          Foley:  05/08/2012   A:  Pos balance, hyponatremia P:   bmet in am  supp k  As we start lasix kvo  GASTROINTESTINAL  Lab 05/12/12 0249  AST 74*  ALT 24  ALKPHOS 46  BILITOT 0.5  PROT 5.9*  ALBUMIN 2.5*   A:  Got agitated placing NG for Gastric distension on AXR P:   Repeat slp as we lower precedex If unable, then place ngt again and start TF Seems more compliant today lft in am   HEMATOLOGIC  Lab 05/12/12 0249 05/11/12 0417 05/10/12 0311 05/09/12 2145 05/09/12 0545  HGB 8.3* 8.7* 8.8* 9.8* 10.9*  HCT 24.2* 25.0* 26.0* 28.2* 32.3*  PLT 215 183 197 217 251  INR -- -- -- -- --  APTT -- -- -- -- --   A:  Post op anemia, s/p hip (high risk dvt), dilutional anemia P:  PRBC for hgb < 7gm% only L;asix , hope for hemoconcentration Cbc in am  No role TxLovenox 0.5 mg/kg for dvt , post  hip  INFECTIOUS  Lab 05/12/12 0249 05/11/12 0417 05/10/12 2144 05/10/12 0311 05/09/12 2145 05/09/12 0545  WBC 11.2* 11.6* -- 14.3* 17.2* 15.6*  PROCALCITON -- -- 0.18 -- -- --   Cultures:  Antibiotics:  A:  No evidence of infection, r/o asp P:   Off all abx, monitor fever curve  ENDOCRINE  Lab 05/12/12 0734 05/12/12 0352 05/11/12 2339 05/11/12 1948 05/11/12 1600  GLUCAP 120* 117* 114* 114* 92   A:  Metabolic syndrome + , controlled glu P:   Monitor ICU hyperglycemia protocol  NEUROLOGIC  A:  #Difficult to Control SEvere POst OP Pain   - Hx of chronic narcs, ETOH and prior substance abuse places him at risk for effective pain mgmt    #On 05/09/12  - Likely opioid induced delirium in setting of developing renal failure Not taking pills at times  P:   precedex  At 1.5, goal to 0.7 today  Not taking Risperdal, consider IM geodon  x1 then re eval, goal reduction precedex Benzo continue scheduled celexa home, avoid SSRI rebound trazadone Limiting narcs as able  BEST PRACTICE / DISPOSITION Level of Care:  Move to ICU 05/09/12 Primary Service:  ORtho to PCCM on 05/09/12  Consultants:  Ortho Code Status:  Full Diet:  NPO DVT Px:  Lovenox SQ GI Px:  Protonix IV Skin Integrity:  Intact Social / Family:  Wife not at bedside, will call. Sisters updated  Ccm time 30 min   Mcarthur Rossetti. Tyson Alias, MD, FACP Pgr: [phone] [facility]

## 2012-05-13 ENCOUNTER — Inpatient Hospital Stay (HOSPITAL_COMMUNITY): Payer: Medicare Other

## 2012-05-13 LAB — COMPREHENSIVE METABOLIC PANEL
ALT: 31 U/L (ref 0–53)
AST: 91 U/L — ABNORMAL HIGH (ref 0–37)
Calcium: 8.8 mg/dL (ref 8.4–10.5)
Creatinine, Ser: 0.91 mg/dL (ref 0.50–1.35)
GFR calc non Af Amer: >90 mL/min (ref >90)
Sodium: 131 mEq/L — ABNORMAL LOW (ref 135–145)
Total Protein: 6.4 g/dL (ref 6.0–8.3)

## 2012-05-13 LAB — TROPONIN I: Troponin I: <0.3 ng/mL (ref <0.30)

## 2012-05-13 LAB — GLUCOSE, CAPILLARY
Glucose-Capillary: 93 mg/dL (ref 70–99)
Glucose-Capillary: 95 mg/dL (ref 70–99)

## 2012-05-13 LAB — CBC WITH DIFFERENTIAL/PLATELET
Basophils Relative: 1 % (ref 0–1)
Eosinophils Absolute: 0.3 10*3/uL (ref 0.0–0.7)
HCT: 27 % — ABNORMAL LOW (ref 39.0–52.0)
Hemoglobin: 9.1 g/dL — ABNORMAL LOW (ref 13.0–17.0)
Lymphs Abs: 1.6 10*3/uL (ref 0.7–4.0)
MCH: 31.3 pg (ref 26.0–34.0)
MCHC: 33.7 g/dL (ref 30.0–36.0)
Monocytes Absolute: 2.3 10*3/uL — ABNORMAL HIGH (ref 0.1–1.0)
Monocytes Relative: 23 % — ABNORMAL HIGH (ref 3–12)
Neutro Abs: 5.7 10*3/uL (ref 1.7–7.7)
Neutrophils Relative %: 58 % (ref 43–77)
RBC: 2.91 MIL/uL — ABNORMAL LOW (ref 4.22–5.81)

## 2012-05-13 MED ORDER — MIDAZOLAM HCL 2 MG/2ML IJ SOLN: 0.5000 mg | Freq: Four times a day (QID) | INTRAMUSCULAR | Status: DC

## 2012-05-13 MED ORDER — OXYCODONE HCL 5 MG PO TABS: 5.0000 mg | ORAL_TABLET | ORAL | Status: DC | PRN

## 2012-05-13 MED ORDER — FENTANYL CITRATE 0.05 MG/ML IJ SOLN
50.0000 ug | INTRAMUSCULAR | Status: DC | PRN
  Administered 2012-05-13 – 2012-05-14 (×3): 100 ug via INTRAVENOUS
  Filled 2012-05-13 (×4): qty 2

## 2012-05-13 MED ORDER — HYDROMORPHONE HCL PF 1 MG/ML IJ SOLN: 0.2500 mg | INTRAMUSCULAR | Status: DC | PRN

## 2012-05-13 MED ORDER — OXYCODONE HCL 5 MG PO TABS
5.0000 mg | ORAL_TABLET | ORAL | Status: DC | PRN
  Administered 2012-05-13: 10 mg via ORAL
  Administered 2012-05-13 – 2012-05-14 (×2): 5 mg via ORAL
  Administered 2012-05-14 (×2): 10 mg via ORAL
  Filled 2012-05-13 (×2): qty 1
  Filled 2012-05-13 (×3): qty 2

## 2012-05-13 MED ORDER — OXYCODONE-ACETAMINOPHEN 5-325 MG PO TABS
1.0000 | ORAL_TABLET | ORAL | Status: DC | PRN
  Administered 2012-05-13: 2 via ORAL
  Filled 2012-05-13 (×2): qty 2

## 2012-05-13 MED ORDER — METOPROLOL TARTRATE 25 MG PO TABS
25.0000 mg | ORAL_TABLET | Freq: Two times a day (BID) | ORAL | Status: DC
  Administered 2012-05-13 – 2012-05-15 (×4): 25 mg via ORAL
  Filled 2012-05-13 (×5): qty 1

## 2012-05-13 MED ORDER — MIDAZOLAM HCL 2 MG/2ML IJ SOLN
0.5000 mg | Freq: Three times a day (TID) | INTRAMUSCULAR | Status: DC
  Administered 2012-05-14 – 2012-05-15 (×4): 0.5 mg via INTRAVENOUS
  Filled 2012-05-13 (×4): qty 2

## 2012-05-13 MED ORDER — MIDAZOLAM HCL 2 MG/2ML IJ SOLN
1.0000 mg | INTRAMUSCULAR | Status: DC | PRN
  Administered 2012-05-13 – 2012-05-14 (×3): 2 mg via INTRAVENOUS
  Filled 2012-05-13 (×3): qty 2

## 2012-05-13 NOTE — Progress Notes (Addendum)
[facility] Progress Note [place] TEAM 1 - Stepdown/ICU TEAM   [name] WUJ:[id number] DOB: 1956-06-30 DOA: 05/08/2012 PCP: [name], MD  Brief narrative: 56 year old morbidly obese male, BMI 43 in 2009, prior etoh in 2009 per chart/last week per wife, prior coccaine in 2009 per chart, > 20 pack year smoker, minimal non-obst CAD, hx of "Drug seeking behavior", hx of poor medication understanding, non compliance with MD visits mentioned in chart review. 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 Diagnosed to have Gold stage 2 copd (fev1 61%, ratio 60 on 04/22/2006 per chart review). Has OSA but does not use CPAP. At baseline has class 3 dyspnea and chronic cough.  He was admitted 05/08/2012 for Left Hip surgery for avascular necrosis and on 05/08/12 underwent L total hip arthroplasty using a 58 mm DePuy Pinnacle by DR Turner Daniels. Post operatively he developed severe delirium and was admitted to the ICU.  Events Since Admission:  05/08/2012 > Admit  9/18- severe agitation, PIV out, precdex  9/19- improved with precedex high dose  9/20- more organized  Assessment/Plan:  Acute on chronic respiratory acidosis due to opioids, COPD, untreated obstructive sleep apnea  Severe acute delirium / toxic metabolic encephalopathy Required Precedex IV drip  Hyponatremia  Postop anemia  Difficult to control severe postoperative pain History of chronic narcotic use, alcohol abuse, and prior history of substance abuse  Morbid obesity  COPD  Avascular necrosis of left femoral head status post surgical intervention Ongoing care as per orthopedic surgery  Acute renal failure  Code Status: Full Disposition Plan:  As day has progressed, pt has become much more alert and conversant - now appears to be stable for transfer to tele bed  Consultants: Orthopedic surgery  Procedures: 05/08/12 L total hip arthroplasty - 58 mm DePuy  Pinnacle  Antibiotics: None  DVT prophylaxis: Lovenox as directed by orthopedic service  HPI/Subjective: At the time of my visit the patient is quite lethargic but he is able to open his eyes.  He cannot provide a reliable history.  His speech is slurred and he is glassy eyed.  There is no family present in the room   Objective: Blood pressure 117/41, pulse 88, temperature 98.7 F (37.1 C), temperature source Oral, resp. rate 17, height 6\' 3"  (1.905 m), weight 150.7 kg (332 lb 3.7 oz), SpO2 98.00%.  Intake/Output Summary (Last 24 hours) at 05/13/12 1726 Last data filed at 05/13/12 0600  Gross per 24 hour  Intake    194 ml  Output   2080 ml  Net  -1886 ml     Exam: General: No acute respiratory distress - patient is sedate/lethargic Lungs: Distant breath sounds throughout with no appreciable wheeze or focal crackles Cardiovascular: Regular rate and rhythm without murmur gallop or rub normal S1 and S2 Abdomen: Obese, nontender, nondistended, soft, bowel sounds positive, no rebound, no ascites, no appreciable mass Extremities: 1+ bilateral lower extremity edema without cyanosis or clubbing  Data Reviewed: Basic Metabolic Panel:  Lab 05/13/12 9562 05/12/12 0249 05/11/12 0417 05/10/12 0311 05/09/12 2145  NA 131* 133* 134* 128* 130*  K 3.6 3.8 4.0 3.7 4.3  CL 96 98 97 91* 92*  CO2 22 25 30 30 29   GLUCOSE 96 111* 127* 112* 124*  BUN 15 15 13 13 15   CREATININE 0.91 0.83 0.92 0.92 1.06  CALCIUM 8.8 8.6 8.9 8.9 9.0  MG -- -- 2.0 -- --  PHOS -- -- 2.7 -- --  Liver Function Tests:  Lab 05/13/12 0500 05/12/12 0249  AST 91* 74*  ALT 31 24  ALKPHOS 54 46  BILITOT 0.9 0.5  PROT 6.4 5.9*  ALBUMIN 2.5* 2.5*   CBC:  Lab 05/13/12 0500 05/12/12 0249 05/11/12 0417 05/10/12 0311 05/09/12 2145  WBC 10.0 11.2* 11.6* 14.3* 17.2*  NEUTROABS 5.7 7.2 -- -- --  HGB 9.1* 8.3* 8.7* 8.8* 9.8*  HCT 27.0* 24.2* 25.0* 26.0* 28.2*  MCV 92.8 92.7 91.6 91.9 92.5  PLT 207 215 183 197 217    Cardiac Enzymes:  Lab 05/13/12 1047 05/13/12 0257 05/12/12 1857 05/12/12 1008 05/12/12 0248  CKTOTAL -- -- -- -- --  CKMB -- -- -- -- --  CKMBINDEX -- -- -- -- --  TROPONINI <0.30 <0.30 <0.30 <0.30 <0.30   BNP (last 3 results)  Basename 11/29/11 2026 08/18/11 0109 07/09/11 2029  PROBNP 19.7 12.5 21.0   CBG:  Lab 05/13/12 1533 05/13/12 1123 05/13/12 0710 05/13/12 0432 05/12/12 2344  GLUCAP 106* 116* 91 95 93    Recent Results (from the past 240 hour(s))  MRSA PCR SCREENING     Status: Normal   Collection Time   05/09/12  6:59 PM      Component Value Range Status Comment   MRSA by PCR NEGATIVE  NEGATIVE Final      Studies:  Recent x-ray studies have been reviewed in detail by the Attending Physician  Scheduled Meds:  Reviewed in detail by the Attending Physician   Lonia Blood, MD [facility] Office  [phone] Pager [phone]  On-Call/Text Page:      [email or web address]      password [email or web address]  If 7PM-7AM, please contact night-coverage [email or web address] Password [email or web address] 05/13/2012, 5:26 PM   LOS: 5 days

## 2012-05-13 NOTE — Progress Notes (Signed)
Subjective: 5 Days Post-Op Procedure(s) (LRB): TOTAL HIP ARTHROPLASTY (Left) C/o of pain in r arm and l hip Activity level:  Can be oob when he cooperates Diet tolerance:  ok Voiding:  ok Patient reports pain as 4 on 0-10 scale.    Objective: Vital signs in last 24 hours: Temp:  [98.5 F (36.9 C)-99 F (37.2 C)] 98.9 F (37.2 C) (09/21 0737) Pulse Rate:  [65-109] 95  (09/21 0700) Resp:  [16-26] 16  (09/21 0500) BP: (97-133)/(38-85) 133/53 mmHg (09/21 0700) SpO2:  [94 %-100 %] 100 % (09/21 0700)  Labs:  Basename 05/13/12 0500 05/12/12 0249 05/11/12 0417  HGB 9.1* 8.3* 8.7*    Basename 05/13/12 0500 05/12/12 0249  WBC 10.0 11.2*  RBC 2.91* 2.61*  HCT 27.0* 24.2*  PLT 207 215    Basename 05/13/12 0500 05/12/12 0249  NA 131* 133*  K 3.6 3.8  CL 96 98  CO2 22 25  BUN 15 15  CREATININE 0.91 0.83  GLUCOSE 96 111*  CALCIUM 8.8 8.6   No results found for this basename: LABPT:2,INR:2 in the last 72 hours  Physical Exam:  Neurologically intact ABD soft Neurovascular intact Sensation intact distally Intact pulses distally Dorsiflexion/Plantar flexion intact No cellulitis present Compartment soft RUE wnl Assessment/Plan:  5 Days Post-Op Procedure(s) (LRB): TOTAL HIP ARTHROPLASTY (Left) Up with therapy Discharge to SNF next week     Chantal Worthey R 05/13/2012, 8:19 AM

## 2012-05-13 NOTE — Progress Notes (Signed)
ANTICOAGULATION CONSULT NOTE - Follow-up  Pharmacy Consult for Lovenox Indication: DVT prophylaxis  Allergies  Allergen Reactions  . Ativan (Lorazepam) Other (See Comments)    "out of mind"  . Dilaudid (Hydromorphone Hcl) Other (See Comments)    " out of mind"  . Haloperidol Decanoate     Patient Measurements: Height: 6\' 3"  (190.5 cm) Weight: 332 lb 3.7 oz (150.7 kg) IBW/kg (Calculated) : 84.5  Body mass index is 41.53 kg/(m^2).   Vital Signs: Temp: 98.9 F (37.2 C) (09/21 0737) Temp src: Oral (09/21 0737) BP: 133/53 mmHg (09/21 0700) Pulse Rate: 95  (09/21 0700)  Labs:  Basename 05/13/12 0500 05/13/12 0257 05/12/12 1857 05/12/12 1008 05/12/12 0249 05/11/12 0417  HGB 9.1* -- -- -- 8.3* --  HCT 27.0* -- -- -- 24.2* 25.0*  PLT 207 -- -- -- 215 183  APTT -- -- -- -- -- --  LABPROT -- -- -- -- -- --  INR -- -- -- -- -- --  HEPARINUNFRC -- -- -- -- -- --  CREATININE 0.91 -- -- -- 0.83 0.92  CKTOTAL -- -- -- -- -- --  CKMB -- -- -- -- -- --  TROPONINI -- <0.30 <0.30 <0.30 -- --    Estimated Creatinine Clearance: 145.7 ml/min (by C-G formula based on Cr of 0.91).  Assessment: DVT prophylaxis:  Patient is s/p left hip THA on 9/16 and continues on lovenox 0.5mg /kg/day due to BMI>30 for VTE prophylaxis. Previously on standard post-op lovenox 30mg  BID + ASA BID for DVT prophylaxis. No bleeding noted. CBC is stable.   Goal of Therapy:  Prevention of VTE Monitor platelets by anticoagulation protocol: Yes   Plan:  1. Change lovenox to 75mg  SQ Q24H 2. F/u CBC as ordered 3. Monitor for signs/symptoms of bleeding  Lysle Pearl, PharmD, BCPS Pager # [phone] 05/13/2012 10:25 AM

## 2012-05-13 NOTE — Progress Notes (Signed)
Changed l hip dressing  - wound looks great Will try on po pain meds  - Percocet / oxy ir Will back off iv meds

## 2012-05-14 ENCOUNTER — Encounter (HOSPITAL_COMMUNITY): Payer: Self-pay | Admitting: Family Medicine

## 2012-05-14 DIAGNOSIS — D500 Iron deficiency anemia secondary to blood loss (chronic): Secondary | ICD-10-CM

## 2012-05-14 LAB — BASIC METABOLIC PANEL
BUN: 17 mg/dL (ref 6–23)
CO2: 29 mEq/L (ref 19–32)
Calcium: 8.9 mg/dL (ref 8.4–10.5)
Creatinine, Ser: 0.9 mg/dL (ref 0.50–1.35)
GFR calc non Af Amer: >90 mL/min (ref >90)
Glucose, Bld: 111 mg/dL — ABNORMAL HIGH (ref 70–99)

## 2012-05-14 LAB — CBC
HCT: 24 % — ABNORMAL LOW (ref 39.0–52.0)
Hemoglobin: 7.9 g/dL — ABNORMAL LOW (ref 13.0–17.0)
MCH: 31.1 pg (ref 26.0–34.0)
MCHC: 32.9 g/dL (ref 30.0–36.0)
MCV: 94.5 fL (ref 78.0–100.0)
RBC: 2.54 MIL/uL — ABNORMAL LOW (ref 4.22–5.81)

## 2012-05-14 MED ORDER — FENTANYL CITRATE 0.05 MG/ML IJ SOLN: 25.0000 ug | INTRAMUSCULAR | Status: DC | PRN

## 2012-05-14 MED ORDER — MIDAZOLAM HCL 2 MG/2ML IJ SOLN: 0.5000 mg | INTRAMUSCULAR | Status: DC | PRN

## 2012-05-14 MED ORDER — OXYCODONE HCL 5 MG PO TABS
10.0000 mg | ORAL_TABLET | ORAL | Status: DC | PRN
  Administered 2012-05-14 – 2012-05-15 (×8): 10 mg via ORAL
  Filled 2012-05-14 (×8): qty 2

## 2012-05-14 MED ORDER — POTASSIUM CHLORIDE CRYS ER 20 MEQ PO TBCR
40.0000 meq | EXTENDED_RELEASE_TABLET | Freq: Two times a day (BID) | ORAL | Status: AC
  Administered 2012-05-14 – 2012-05-15 (×2): 40 meq via ORAL
  Filled 2012-05-14 (×2): qty 2

## 2012-05-14 NOTE — Progress Notes (Addendum)
Subjective: 6 Days Post-Op Procedure(s) (LRB): TOTAL HIP ARTHROPLASTY (Left) Seems more comfortable . HGB 7.9 vss stable Activity level:  Needs to be oob with therapy pwb-wbat l leg Diet tolerance:  eating Voiding:  ok Patient reports pain as 6 on 0-10 scale.    Objective: Vital signs in last 24 hours: Temp:  [98.6 F (37 C)-99.1 F (37.3 C)] 99.1 F (37.3 C) (09/22 0405) Pulse Rate:  [74-88] 86  (09/22 0405) Resp:  [10-23] 18  (09/22 0405) BP: (109-135)/(41-72) 135/72 mmHg (09/22 0405) SpO2:  [88 %-100 %] 96 % (09/22 0405)  Labs:  Basename 05/14/12 0628 05/13/12 0500 05/12/12 0249  HGB 7.9* 9.1* 8.3*    Basename 05/14/12 0628 05/13/12 0500  WBC 10.4 10.0  RBC 2.54* 2.91*  HCT 24.0* 27.0*  PLT 276 207    Basename 05/14/12 0628 05/13/12 0500  NA 136 131*  K 3.2* 3.6  CL 97 96  CO2 29 22  BUN 17 15  CREATININE 0.90 0.91  GLUCOSE 111* 96  CALCIUM 8.9 8.8   No results found for this basename: LABPT:2,INR:2 in the last 72 hours  Physical Exam:  Neurologically intact ABD soft Neurovascular intact Sensation intact distally Intact pulses distally Dorsiflexion/Plantar flexion intact Incision: dressing C/D/I No cellulitis present Compartment soft  Assessment/Plan:  6 Days Post-Op Procedure(s) (LRB): TOTAL HIP ARTHROPLASTY (Left) Up with therapy Discharge to SNF hopefully in a few days Seems stable with hgb of 7.9 , will let Dr. Laurier Nancy transfuse if needed OOB with therapy pwb-wbat left leg    Lovel Suazo R 05/14/2012, 11:41 AM

## 2012-05-14 NOTE — Progress Notes (Signed)
[facility] Progress Note    TAGE FEGGINS MWU:[id number] DOB: 06-26-1956 DOA: 05/08/2012 PCP: [name], MD  Brief narrative: 56 year old morbidly obese male, BMI 43 in 2009, prior etoh in 2009 per chart/last week per wife, prior coccaine in 2009 per chart, > 20 pack year smoker, minimal non-obst CAD, hx of "Drug seeking behavior", hx of poor medication understanding, non compliance with MD visits mentioned in chart review. 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 Diagnosed to have Gold stage 2 copd (fev1 61%, ratio 60 on 04/22/2006 per chart review). Has OSA but does not use CPAP. At baseline has class 3 dyspnea and chronic cough.  He was admitted 05/08/2012 for Left Hip surgery for avascular necrosis and on 05/08/12 underwent L total hip arthroplasty using a 58 mm DePuy Pinnacle by DR Turner Daniels. Post operatively he developed severe delirium and was admitted to the ICU.  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  Code Status: Full Disposition Plan:  As day has progressed, pt has become much more alert and conversant - now appears to be stable for transfer to tele bed  Consultants: Orthopedic surgery  Procedures: 05/08/12 L total  hip arthroplasty - 58 mm DePuy Pinnacle  Antibiotics: None  DVT prophylaxis: Lovenox as directed by orthopedic service  HPI/Subjective:pt is lethargic, but asking for more pain medications.   Objective: Blood pressure 135/72, pulse 86, temperature 99.1 F (37.3 C), temperature source Oral, resp. rate 18, height 6\' 3"  (1.905 m), weight 150.7 kg (332 lb 3.7 oz), SpO2 96.00%.  Intake/Output Summary (Last 24 hours) at 05/14/12 0905 Last data filed at 05/14/12 0026  Gross per 24 hour  Intake    474 ml  Output   1650 ml  Net  -1176 ml     Exam: General: No acute respiratory distress - patient is sedate/lethargic Lungs: Distant breath sounds throughout with no appreciable wheeze or focal crackles Cardiovascular: Regular rate and rhythm without murmur gallop or rub normal S1 and S2 Abdomen: Obese, nontender, nondistended, soft, bowel sounds positive, no rebound, no ascites, no appreciable mass Extremities: 1+ bilateral lower extremity edema without cyanosis or clubbing  Data Reviewed: Basic Metabolic Panel:  Lab 05/14/12 7253 05/13/12 0500 05/12/12 0249 05/11/12 0417 05/10/12 0311  NA 136 131* 133* 134* 128*  K 3.2* 3.6 3.8 4.0 3.7  CL 97 96 98 97 91*  CO2 29 22 25 30 30   GLUCOSE 111* 96 111* 127* 112*  BUN 17 15 15 13 13   CREATININE 0.90 0.91 0.83 0.92 0.92  CALCIUM 8.9 8.8 8.6 8.9 8.9  MG -- -- -- 2.0 --  PHOS -- -- -- 2.7 --   Liver Function Tests:  Lab 05/13/12 0500 05/12/12 0249  AST 91* 74*  ALT 31 24  ALKPHOS 54 46  BILITOT 0.9  0.5  PROT 6.4 5.9*  ALBUMIN 2.5* 2.5*   CBC:  Lab 05/14/12 0628 05/13/12 0500 05/12/12 0249 05/11/12 0417 05/10/12 0311  WBC 10.4 10.0 11.2* 11.6* 14.3*  NEUTROABS -- 5.7 7.2 -- --  HGB 7.9* 9.1* 8.3* 8.7* 8.8*  HCT 24.0* 27.0* 24.2* 25.0* 26.0*  MCV 94.5 92.8 92.7 91.6 91.9  PLT 276 207 215 183 197   Cardiac Enzymes:  Lab 05/13/12 1047 05/13/12 0257 05/12/12 1857 05/12/12 1008 05/12/12 0248  CKTOTAL -- -- -- -- --  CKMB --  -- -- -- --  CKMBINDEX -- -- -- -- --  TROPONINI <0.30 <0.30 <0.30 <0.30 <0.30   BNP (last 3 results)  Basename 11/29/11 2026 08/18/11 0109 07/09/11 2029  PROBNP 19.7 12.5 21.0   CBG:  Lab 05/13/12 1934 05/13/12 1533 05/13/12 1123 05/13/12 0710 05/13/12 0432  GLUCAP 104* 106* 116* 91 95    Recent Results (from the past 240 hour(s))  MRSA PCR SCREENING     Status: Normal   Collection Time   05/09/12  6:59 PM      Component Value Range Status Comment   MRSA by PCR NEGATIVE  NEGATIVE Final         Kathlen Mody, MD [facility] Office  [phone] Pager [phone]  On-Call/Text Page:      [email or web address]      password [email or web address]  If 7PM-7AM, please contact night-coverage [email or web address] Password [email or web address] 05/14/2012, 9:05 AM   LOS: 6 days

## 2012-05-15 DIAGNOSIS — I10 Essential (primary) hypertension: Secondary | ICD-10-CM

## 2012-05-15 DIAGNOSIS — E876 Hypokalemia: Secondary | ICD-10-CM

## 2012-05-15 LAB — BASIC METABOLIC PANEL
BUN: 17 mg/dL (ref 6–23)
Calcium: 8.9 mg/dL (ref 8.4–10.5)
GFR calc Af Amer: >90 mL/min (ref >90)
GFR calc non Af Amer: >90 mL/min (ref >90)
Potassium: 3.3 mEq/L — ABNORMAL LOW (ref 3.5–5.1)
Sodium: 136 mEq/L (ref 135–145)

## 2012-05-15 LAB — CBC
MCH: 31.5 pg (ref 26.0–34.0)
MCHC: 33.5 g/dL (ref 30.0–36.0)
RDW: 13 % (ref 11.5–15.5)

## 2012-05-15 MED ORDER — OXYCODONE-ACETAMINOPHEN 5-325 MG PO TABS
1.0000 | ORAL_TABLET | Freq: Four times a day (QID) | ORAL | Status: DC | PRN
  Administered 2012-05-15: 2 via ORAL
  Filled 2012-05-15: qty 2

## 2012-05-15 MED ORDER — ALPRAZOLAM 1 MG PO TABS: 1.0000 mg | ORAL_TABLET | Freq: Three times a day (TID) | ORAL | Status: DC | PRN

## 2012-05-15 MED ORDER — POTASSIUM CHLORIDE CRYS ER 20 MEQ PO TBCR
40.0000 meq | EXTENDED_RELEASE_TABLET | Freq: Once | ORAL | Status: AC
  Administered 2012-05-15: 40 meq via ORAL
  Filled 2012-05-15: qty 2

## 2012-05-15 MED ORDER — ALBUTEROL SULFATE (5 MG/ML) 0.5% IN NEBU
2.5000 mg | INHALATION_SOLUTION | Freq: Four times a day (QID) | RESPIRATORY_TRACT | Status: DC
  Administered 2012-05-15: 2.5 mg via RESPIRATORY_TRACT
  Filled 2012-05-15: qty 0.5

## 2012-05-15 MED ORDER — OXYCODONE-ACETAMINOPHEN 10-325 MG PO TABS: 1.0000 | ORAL_TABLET | Freq: Four times a day (QID) | ORAL | Status: DC | PRN

## 2012-05-15 MED ORDER — ADULT MULTIVITAMIN W/MINERALS CH: 1.0000 | ORAL_TABLET | Freq: Every day | ORAL | Status: DC

## 2012-05-15 MED ORDER — IPRATROPIUM BROMIDE 0.02 % IN SOLN
0.5000 mg | Freq: Three times a day (TID) | RESPIRATORY_TRACT | Status: DC
  Administered 2012-05-15: 0.5 mg via RESPIRATORY_TRACT
  Filled 2012-05-15: qty 2.5

## 2012-05-15 MED ORDER — ALBUTEROL SULFATE (5 MG/ML) 0.5% IN NEBU: 2.5000 mg | INHALATION_SOLUTION | Freq: Three times a day (TID) | RESPIRATORY_TRACT | Status: DC

## 2012-05-15 MED ORDER — ASPIRIN EC 325 MG PO TBEC: 325.0000 mg | DELAYED_RELEASE_TABLET | Freq: Every day | ORAL | Status: AC

## 2012-05-15 MED ORDER — SODIUM CHLORIDE 0.9 % IJ SOLN
3.0000 mL | INTRAMUSCULAR | Status: DC | PRN
  Administered 2012-05-15: 3 mL via INTRAVENOUS

## 2012-05-15 MED ORDER — ASPIRIN 81 MG PO TABS: 81.0000 mg | ORAL_TABLET | Freq: Every day | ORAL | Status: AC

## 2012-05-15 MED ORDER — IPRATROPIUM BROMIDE 0.02 % IN SOLN: 0.5000 mg | Freq: Three times a day (TID) | RESPIRATORY_TRACT | Status: DC

## 2012-05-15 MED ORDER — SENNOSIDES-DOCUSATE SODIUM 8.6-50 MG PO TABS: 1.0000 | ORAL_TABLET | Freq: Two times a day (BID) | ORAL | Status: DC

## 2012-05-15 MED ORDER — POTASSIUM CHLORIDE 10 MEQ/100ML IV SOLN
10.0000 meq | Freq: Once | INTRAVENOUS | Status: AC
  Administered 2012-05-15: 10 meq via INTRAVENOUS
  Filled 2012-05-15: qty 100

## 2012-05-15 MED ORDER — PRO-STAT SUGAR FREE PO LIQD: 30.0000 mL | Freq: Two times a day (BID) | ORAL | Status: DC

## 2012-05-15 NOTE — Progress Notes (Signed)
Pt discharge instructions and education in packet for SNF. IV site d/c. Sites WNL. Family updated at bedside. Awaiting ambulance to be transferred to [facility]. Dion Saucier

## 2012-05-15 NOTE — Progress Notes (Signed)
Occupational Therapy Treatment Patient Details Name: [name] MRN: [id number] DOB: May 10, 1956 Today's Date: 05/15/2012 Time: 4098-1191 OT Time Calculation (min): 24 min  OT Assessment / Plan / Recommendation Comments on Treatment Session Pt limited by pain in R shoulder, MD is aware.  Knowledgeable in use of AE and stated 2 of 3 hip precautions.  Pt is anxious to get to rehab in SNF and return home.    Follow Up Recommendations  Skilled nursing facility    Barriers to Discharge       Equipment Recommendations  None recommended by OT    Recommendations for Other Services    Frequency Min 2X/week   Plan Discharge plan remains appropriate    Precautions / Restrictions Precautions Precautions: Fall;Posterior Hip Precaution Comments: Pt able to state 2/3 hip precautions (not inward rotation of hip) Restrictions Weight Bearing Restrictions: Yes LLE Weight Bearing: Weight bearing as tolerated   Pertinent Vitals/Pain 10/10 pain in R shoulder, repositioned, RN aware    ADL  Grooming: Performed;Wash/dry face;Wash/dry hands;Set up Where Assessed - Grooming: Unsupported sitting Toilet Transfer: Simulated;+2 Total assistance Toilet Transfer: Patient Percentage: 70% Toilet Transfer Method: Sit to Barista: Other (comment) (to recliner) Equipment Used: Gait belt;Rolling walker Transfers/Ambulation Related to ADLs: Pain in pt's R shoulder preventing him from being able to ambulate with RW. ADL Comments: Pt directing care throughout session.    OT Diagnosis:    OT Problem List:   OT Treatment Interventions:     OT Goals ADL Goals Pt Will Transfer to Toilet: with mod assist;with DME;3-in-1 ADL Goal: Toilet Transfer - Progress: Progressing toward goals Miscellaneous OT Goals Miscellaneous OT Goal #1: Pt will verbalzie posterior hip precautions as precursor to adls OT Goal: Miscellaneous Goal #1 - Progress: Progressing toward goals Miscellaneous OT Goal  #2: Pt will perform bed mobility Min (A) as precursor to adls OT Goal: Miscellaneous Goal #2 - Progress: Progressing toward goals  Visit Information  Last OT Received On: 05/15/12 Assistance Needed: +2 PT/OT Co-Evaluation/Treatment: Yes    Subjective Data  Subjective: "My shoulder is more painful than my hip."   Prior Functioning       Cognition  Overall Cognitive Status: Appears within functional limits for tasks assessed/performed Arousal/Alertness: Awake/alert Orientation Level: Appears intact for tasks assessed    Mobility  Shoulder Instructions Bed Mobility Bed Mobility: Supine to Sit;Sitting - Scoot to Edge of Bed Supine to Sit: 1: +2 Total assist Supine to Sit: Patient Percentage: 60% Sitting - Scoot to Edge of Bed: 1: +2 Total assist Sitting - Scoot to Edge of Bed: Patient Percentage: 60% Details for Bed Mobility Assistance: (A) for safety and maintaining hip precautions.  Max cues for hand placement and proper technique Transfers Transfers: Sit to Stand;Stand to Sit Sit to Stand: 1: +2 Total assist;From bed;From elevated surface;With upper extremity assist Sit to Stand: Patient Percentage: 70% Stand to Sit: 1: +2 Total assist;To chair/3-in-1;With upper extremity assist Stand to Sit: Patient Percentage: 70%       Exercises      Balance     End of Session OT - End of Session Activity Tolerance: Patient limited by pain Patient left: in chair;with call bell/phone within reach Nurse Communication: Mobility status  GO     [name] 05/15/2012, 10:49 AM [phone]

## 2012-05-15 NOTE — Discharge Summary (Addendum)
Physician Discharge Summary  [name]:[id number] DOB: 12-20-1955 DOA: 05/08/2012  PCP: [name], MD  Admit date: 05/08/2012 Discharge date: 05/15/2012  Recommendations for Outpatient Follow-up:  1. Follow up with orthopedics in 3 weeks 2. Please check BMP tomorrow at the facility to make sure potassium is within normal limits.  3.   PT/OT WBAT, THA posterior precautions 4. DVT Prophylaxis: SCDx72 hrs, ASA 325 mg BID x 2 weeks as recommended by ortho. Followed by aspirin 81 mg daily.     Discharge Diagnoses:  Principal Problem:  *Acute-on-chronic respiratory failure Active Problems:  HYPERCHOLESTEROLEMIA, PURE  OBESITY, MORBID  COPD  HIATAL HERNIA  HISTORY OF ASBESTOS EXPOSURE  Avascular necrosis of femur head, left  Aspiration pneumonia  Acute encephalopathy  Acute renal failure  Post-operative pain  Hypokalemia  Blood loss anemia   Discharge Condition: stable  Diet recommendation: low sodium diet.   Filed Weights   05/09/12 1900 05/12/12 0500 05/15/12 0500  Weight: 155.9 kg (343 lb 11.2 oz) 150.7 kg (332 lb 3.7 oz) 148 kg (326 lb 4.5 oz)    History of present illness:  56 year old morbidly obese male, BMI 43 in 2009, prior etoh in 2009 per chart/last week per wife, prior coccaine in 2009 per chart, > 20 pack year smoker, minimal non-obst CAD, hx of "Drug seeking behavior", hx of poor medication understanding, non compliance with MD visits mentioned in chart review. 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 Diagnosed to have Gold stage 2 copd (fev1 61%, ratio 60 on 04/22/2006 per chart review). Has OSA but does not use CPAP. At baseline has class 3 dyspnea and chronic cough. He was admitted 05/08/2012 for Left Hip surgery for avascular necrosis and on 05/08/12 underwent L total hip arthroplasty using a 58 mm DePuy Pinnacle by DR Turner Daniels. Post operatively he developed severe delirium and was admitted to the ICU.  Events  Since Admission:  05/08/2012 > Admit  9/18- severe agitation, PIV out, precdex  9/19- improved with precedex high dose  9/20- more organized and transferred to regular floor on 9/22. 9/23- discharge to SNF.   Hospital Course:  Acute on chronic respiratory acidosis due to opioids, COPD, untreated obstructive sleep apnea  RESOLVED.  All sedatives have been discontinued and he is being discharged on percocet every 6 hours as needed.  Severe acute delirium / toxic metabolic encephalopathy  Required Precedex IV drip  And resolved.  Hyponatremia  Resolved.   Difficult to control severe postoperative pain  History of chronic narcotic use, alcohol abuse, and prior history of substance abuse . His pain is controlled on 10 mg of oxycodone every 6 hours.  Morbid obesity  COPD  Stable. 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  PT/OT WBAT, THA posterior precautions  DVT Prophylaxis: SCDx72 hrs, ASA 325 mg BID x 2 weeks as recommended by ortho. Followed by aspirin 81 mg daily.    Acute renal failure: resolved  Anemia/ Blood loss anemia secondary to surgery:  His hemoglobin is stable at 7.9. Will need transfusion if his hgb is less than 7.  Hypokalemia: replete as needed.    Procedures:  TOTAL HIP ARTHROPLASTY  Consultations:  PCCM  ORTHOPEDICS  Discharge Exam: Filed Vitals:   05/14/12 2025 05/14/12 2115 05/15/12 0500 05/15/12 0956  BP:  132/53 143/59 141/62  Pulse:  90 89  88  Temp:  99.2 F (37.3 C) 99.9 F (37.7 C)   TempSrc:  Oral Oral   Resp:  19 18   Height:      Weight:   148 kg (326 lb 4.5 oz)   SpO2: 94% 96% 93%     Exam:  General: No acute respiratory distress - patient is alert and oriented.   Lungs: Distant breath sounds throughout with no appreciable wheeze or focal crackles  Cardiovascular: Regular rate and rhythm without murmur gallop or rub  normal S1 and S2  Abdomen: Obese, nontender, nondistended, soft, bowel sounds positive, no rebound, no ascites, no appreciable mass  Extremities: 1+ bilateral lower extremity edema without cyanosis or clubbing      Discharge Instructions  Discharge Orders    Future Orders Please Complete By Expires   Diet - low sodium heart healthy      Discharge instructions      Comments:   Follow up with orthopedics in 2 weeks. SCD'S FOR 72 HOURS AND ASPIRIN 325 MG DAILY FOR 2 WEEKS FOLLOWED BY 81 MG DAILY .       Medication List     As of 05/15/2012 12:01 PM    STOP taking these medications         hydrochlorothiazide 25 MG tablet   Commonly known as: HYDRODIURIL      TAKE these medications         albuterol 108 (90 BASE) MCG/ACT inhaler   Commonly known as: PROVENTIL HFA;VENTOLIN HFA   Inhale 2 puffs into the lungs every 6 (six) hours as needed. For shortness of breath      ALPRAZolam 1 MG tablet   Commonly known as: XANAX   Take 1 tablet (1 mg total) by mouth 3 (three) times daily as needed for anxiety. For anxiety      amLODipine 5 MG tablet   Commonly known as: NORVASC   Take 5 mg by mouth daily.      aspirin EC 325 MG tablet   Take 1 tablet (325 mg total) by mouth daily.   Start taking on: 05/17/2012      aspirin 81 MG tablet   Take 1 tablet (81 mg total) by mouth daily.   Start taking on: 05/31/2012      budesonide-formoterol 160-4.5 MCG/ACT inhaler   Commonly known as: SYMBICORT   Inhale 2 puffs into the lungs 2 (two) times daily.      citalopram 40 MG tablet   Commonly known as: CELEXA   Take 40 mg by mouth daily.      feeding supplement Liqd   Place 30 mLs into feeding tube 2 (two) times daily.      hydrOXYzine 25 MG capsule   Commonly known as: VISTARIL   Take 25 mg by mouth 3 (three) times daily as needed. For itching      isosorbide mononitrate 30 MG 24 hr tablet   Commonly known as: IMDUR   Take 30 mg by mouth daily.      metoprolol tartrate 25 MG  tablet   Commonly known as: LOPRESSOR   Take 25 mg by mouth daily.      multivitamin with minerals Tabs   Take 1 tablet by mouth daily.      nitroGLYCERIN 0.4 MG SL tablet   Commonly known as: NITROSTAT   Place 0.4 mg under the tongue every 5 (five) minutes as needed. For chest pain      omeprazole 20 MG capsule  Commonly known as: PRILOSEC   Take 20 mg by mouth daily.      oxyCODONE-acetaminophen 10-325 MG per tablet   Commonly known as: PERCOCET   Take 1 tablet by mouth every 6 (six) hours as needed. For pain      senna-docusate 8.6-50 MG per tablet   Commonly known as: Senokot-S   Take 1 tablet by mouth 2 (two) times daily.      traZODone 100 MG tablet   Commonly known as: DESYREL   Take 100 mg by mouth at bedtime.          The results of significant diagnostics from this hospitalization (including imaging, microbiology, ancillary and laboratory) are listed below for reference.    Significant Diagnostic Studies: Dg Chest 2 View  05/03/2012  *RADIOLOGY REPORT*  Clinical Data: Left hip arthroplasty.  Preop.  COPD.  Hypertension. CHF.  Smoker.  CHEST - 2 VIEW  Comparison: 02/10/2012  Findings: Hyperinflation.  Moderate mid thoracic spondylosis. Midline trachea.  Borderline cardiomegaly.     Mediastinal contours otherwise within normal limits.  No pleural effusion or pneumothorax.  Diffuse peribronchial thickening.  Clear lungs.  IMPRESSION: 1.  No acute cardiopulmonary disease. 2.  COPD/chronic bronchitis.   Original Report Authenticated By: Consuello Bossier, M.D.    Dg Hip Operative Left  05/08/2012  *RADIOLOGY REPORT*  Clinical Data: Avascular necrosis of the left hip.  Left total hip replacement.  OPERATIVE LEFT HIP  Comparison: None.  Findings: Intraoperative images demonstrate the drill bit in the central portion of the intramedullary canal of the proximal left femur.  Sclerotic bone lesion is seen in the proximal femoral shaft.  IMPRESSION: Drill bit appears in good  position.   Original Report Authenticated By: Gwynn Burly, M.D.    Dg Pelvis Portable  05/08/2012  *RADIOLOGY REPORT*  Clinical Data: Postop total left hip arthroplasty.  PORTABLE PELVIS  Comparison: None  Findings: On the femoral and acetabular components are well seated. No complicating features are demonstrated.  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  The right hip prosthesis is stable.  IMPRESSION: Well seated components of a total left hip arthroplasty without complicating features.   Original Report Authenticated By: P. Loralie Champagne, M.D.    Dg Chest Port 1 View  05/13/2012  *RADIOLOGY REPORT*  Clinical Data: Evaluate pulmonary edema  PORTABLE CHEST - 1 VIEW  Comparison: 05/12/2012; 05/09/2012; 05/03/2012  Findings:  Examination is slightly degraded secondary to patient body habitus and portable technique.  Grossly unchanged enlarged cardiac silhouette and mediastinal contours. Grossly unchanged bilateral infrahilar and lower lung heterogeneous air space opacities.  Pulmonary vasculature is indistinct.  No definite pleural effusion or pneumothorax.  Grossly unchanged bones.  IMPRESSION: 1.  Grossly unchanged bilateral infrahilar and basilar heterogeneous air space opacities while possibly atelectasis, worrisome for multifocal infection. Further evaluation with a PA and lateral chest radiograph may be obtained as clinically indicated.  Mild cephalization of flow without frank evidence of edema.   Original Report Authenticated By: Waynard Reeds, M.D.    Dg Chest Port 1 View  05/12/2012  *RADIOLOGY REPORT*  Clinical Data: Congestion and shortness of breath.  PORTABLE CHEST - 1 VIEW  Comparison: Chest 05/09/2012 and 05/03/2012  Findings: There is cardiomegaly.  There is left perihilar and right basilar airspace disease.  No pneumothorax.  No pleural effusion.  IMPRESSION: Left perihilar and  right basilar airspace  disease could be due to asymmetric edema or pneumonia.   Original Report Authenticated By: Bernadene Bell. Maricela Curet, M.D.    Dg Chest Port 1 View  05/09/2012  *RADIOLOGY REPORT*  Clinical Data: Respiratory distress.  PORTABLE CHEST - 1 VIEW  Comparison: Two-view chest 05/03/2012.  Findings: Cardiac enlargement is exaggerated by low lung volumes. There is progressive right middle or lower lobe airspace disease, worrisome for developing pneumonia.  Mild pulmonary vascular congestion is now present.  IMPRESSION:  1.  Right to lower or more likely middle lobe airspace disease is worrisome for developing pneumonia. 2.  Cardiomegaly and progressive pulmonary vascular congestion.   Original Report Authenticated By: Jamesetta Orleans. MATTERN, M.D.    Dg Abd Portable 1v  05/11/2012  *RADIOLOGY REPORT*  Clinical Data: NG tube placement.  PORTABLE ABDOMEN - 1 VIEW  Comparison: Single view of the abdomen 05/11/2012.  Findings: NG tube is not visualized. Gaseous distention of the colon noted.  IMPRESSION: No NG tube is visualized. Finding called to the patient's nurse, Fayrene Fearing, at the time of interpreation.   Original Report Authenticated By: Bernadene Bell. Maricela Curet, M.D.    Dg Abd Portable 1v  05/11/2012  *RADIOLOGY REPORT*  Clinical Data: Tube placement.  PORTABLE ABDOMEN - 1 VIEW  Comparison: 05/09/2012  Findings: There is no NG tube visible in the abdomen.  Gaseous distention of the stomach has resolved.  There is moderate gas in the colon and in the stomach but these are not significantly distended.  No acute osseous abnormality.  IMPRESSION: No visible NG tube.  Decreased air in the stomach.   Original Report Authenticated By: Gwynn Burly, M.D.    Dg Abd Portable 1v  05/09/2012  *RADIOLOGY REPORT*  Clinical Data: Vomiting.  Abdominal pain.  PORTABLE ABDOMEN - 1 VIEW  Comparison: Single view pelvis 05/08/2012.  Findings: There is marked gastric distention.  The bowel gas pattern is otherwise  unremarkable.  Gas is present in both large and small bowel.  Although no definite free air is seen, it is difficult to exclude free air on the basis of this single view. Recommend supine and upright to views of the abdomen or decubitus imaging if the patient cannot tolerate upright imaging.  IMPRESSION:  1.  Marked gastric distention. 2.  Normal gas pattern in the large and small bowel. 3.  Although no definite free air is present, upright or decubitus imaging would be useful for further evaluation.   Original Report Authenticated By: Jamesetta Orleans. MATTERN, M.D.     Microbiology: Recent Results (from the past 240 hour(s))  MRSA PCR SCREENING     Status: Normal   Collection Time   05/09/12  6:59 PM      Component Value Range Status Comment   MRSA by PCR NEGATIVE  NEGATIVE Final      Labs: Basic Metabolic Panel:  Lab 05/15/12 1610 05/14/12 0628 05/13/12 0500 05/12/12 0249 05/11/12 0417  NA 136 136 131* 133* 134*  K 3.3* 3.2* 3.6 3.8 4.0  CL 100 97 96 98 97  CO2 28 29 22 25 30   GLUCOSE 114* 111* 96 111* 127*  BUN 17 17 15 15 13   CREATININE 0.83 0.90 0.91 0.83 0.92  CALCIUM 8.9 8.9 8.8 8.6 8.9  MG 2.1 -- -- -- 2.0  PHOS -- -- -- -- 2.7   Liver Function Tests:  Lab 05/13/12 0500 05/12/12 0249  AST 91* 74*  ALT 31 24  ALKPHOS 54 46  BILITOT 0.9 0.5  PROT 6.4  5.9*  ALBUMIN 2.5* 2.5*   No results found for this basename: LIPASE:5,AMYLASE:5 in the last 168 hours No results found for this basename: AMMONIA:5 in the last 168 hours CBC:  Lab 05/15/12 0500 05/14/12 0628 05/13/12 0500 05/12/12 0249 05/11/12 0417  WBC 10.5 10.4 10.0 11.2* 11.6*  NEUTROABS -- -- 5.7 7.2 --  HGB 7.9* 7.9* 9.1* 8.3* 8.7*  HCT 23.6* 24.0* 27.0* 24.2* 25.0*  MCV 94.0 94.5 92.8 92.7 91.6  PLT 289 276 207 215 183   Cardiac Enzymes:  Lab 05/13/12 1047 05/13/12 0257 05/12/12 1857 05/12/12 1008 05/12/12 0248  CKTOTAL -- -- -- -- --  CKMB -- -- -- -- --  CKMBINDEX -- -- -- -- --  TROPONINI <0.30 <0.30  <0.30 <0.30 <0.30   BNP: BNP (last 3 results)  Basename 11/29/11 2026 08/18/11 0109 07/09/11 2029  PROBNP 19.7 12.5 21.0   CBG:  Lab 05/13/12 1934 05/13/12 1533 05/13/12 1123 05/13/12 0710 05/13/12 0432  GLUCAP 104* 106* 116* 91 95    Time coordinating discharge: 65 minutes  Signed:  Anda Sobotta  [facility] 05/15/2012, 12:01 PM

## 2012-05-15 NOTE — Progress Notes (Signed)
Physical Therapy Treatment Patient Details Name: [name] MRN: [id number] DOB: [birth year] Today's Date: 05/15/2012 Time: 6578-4696 PT Time Calculation (min): 23 min  PT Assessment / Plan / Recommendation Comments on Treatment Session  Pt s/p lt  THA.  Pt with resp failure.  Pt now improving with mobiilty.  Limited today primarily by rt shoulder/arm pain.    Follow Up Recommendations  Skilled nursing facility    Barriers to Discharge        Equipment Recommendations  None recommended by PT    Recommendations for Other Services    Frequency 7X/week   Plan Discharge plan remains appropriate;Frequency remains appropriate    Precautions / Restrictions Precautions Precautions: Fall;Posterior Hip Precaution Comments: Pt able to state 2/3 hip precautions (not inward rotation of hip) Restrictions Weight Bearing Restrictions: Yes LLE Weight Bearing: Weight bearing as tolerated   Pertinent Vitals/Pain Rt shoulder/arm pain    Mobility  Bed Mobility Bed Mobility: Supine to Sit;Sitting - Scoot to Edge of Bed Supine to Sit: 1: +2 Total assist Supine to Sit: Patient Percentage: 60% Sitting - Scoot to Edge of Bed: 1: +2 Total assist Sitting - Scoot to Edge of Bed: Patient Percentage: 60% Details for Bed Mobility Assistance: (A) for safety and maintaining hip precautions.  Max cues for hand placement and proper technique Transfers Sit to Stand: 1: +2 Total assist;From bed;From elevated surface;With upper extremity assist Sit to Stand: Patient Percentage: 70% Stand to Sit: 1: +2 Total assist;To chair/3-in-1;With upper extremity assist Stand to Sit: Patient Percentage: 70% Details for Transfer Assistance: verbal cues for hand placement Ambulation/Gait Ambulation/Gait Assistance: 4: Min assist Ambulation Distance (Feet): 2 Feet Assistive device: Rolling walker Ambulation/Gait Assistance Details: Amb limited by rt arm pain. Gait Pattern: Step-to pattern;Decreased step length -  right;Decreased step length - left    Exercises     PT Diagnosis:    PT Problem List:   PT Treatment Interventions:     PT Goals Acute Rehab PT Goals PT Goal: Supine/Side to Sit - Progress: Progressing toward goal PT Goal: Sit at Edge Of Bed - Progress: Met PT Goal: Sit to Stand - Progress: Progressing toward goal PT Goal: Stand to Sit - Progress: Progressing toward goal PT Goal: Ambulate - Progress: Progressing toward goal  Visit Information  Last PT Received On: 05/15/12 Assistance Needed: +2    Subjective Data  Subjective: Pt stating his rt shoulder hurts more than his hip.   Cognition  Overall Cognitive Status: Appears within functional limits for tasks assessed/performed Arousal/Alertness: Awake/alert Orientation Level: Appears intact for tasks assessed    Balance  Static Standing Balance Static Standing - Balance Support: Bilateral upper extremity supported (on walker) Static Standing - Level of Assistance: 4: Min assist  End of Session PT - End of Session Equipment Utilized During Treatment: Gait belt Activity Tolerance: Patient limited by pain Patient left: in chair;with call bell/phone within reach Nurse Communication: Mobility status   GP     Beverlee Wilmarth 05/15/2012, 11:04 AM  Skip Mayer PT [phone]

## 2012-05-15 NOTE — Progress Notes (Signed)
CSW called and confirmed bed availability at [facility], [facility], [place].  CSW confirmed d/c with RN and text paged MD to confirm d/c plans.  FL2 is on the chart- needs signature.  As soon as CSW is able to access the d/c summary and AVS, CSW will facilitate d/c to SNF. Vickii Penna, LCSWA [phone]  Clinical Social Work

## 2012-05-15 NOTE — Progress Notes (Signed)
PATIENT ID: [name]  MRN: [id number]  DOB/AGE:  [birth year] / 56 y.o.  7 Days Post-Op Procedure(s) (LRB): TOTAL HIP ARTHROPLASTY (Left)    PROGRESS NOTE Subjective: Awake, more alert this morning.  Hip pain controlled.    Objective: Vital signs in last 24 hours: Filed Vitals:   05/14/12 1445 05/14/12 2025 05/14/12 2115 05/15/12 0500  BP: 130/70  132/53 143/59  Pulse: 82  90 89  Temp: 98.9 F (37.2 C)  99.2 F (37.3 C) 99.9 F (37.7 C)  TempSrc:   Oral Oral  Resp: 18  19 18   Height:      Weight:    148 kg (326 lb 4.5 oz)  SpO2: 96% 94% 96% 93%      Intake/Output from previous day: I/O last 3 completed shifts: In: 730 [P.O.:720; I.V.:10] Out: 875 [Urine:875]   Intake/Output this shift:     LABORATORY DATA:  Basename 05/15/12 0500 05/14/12 0628 05/13/12 1934 05/13/12 1533 05/13/12 1123  WBC 10.5 10.4 -- -- --  HGB 7.9* 7.9* -- -- --  HCT 23.6* 24.0* -- -- --  PLT 289 276 -- -- --  NA 136 136 -- -- --  K 3.3* 3.2* -- -- --  CL 100 97 -- -- --  CO2 28 29 -- -- --  BUN 17 17 -- -- --  CREATININE 0.83 0.90 -- -- --  GLUCOSE 114* 111* -- -- --  GLUCAP -- -- 104* 106* 116*  INR -- -- -- -- --  CALCIUM 8.9 -- -- -- --    Examination: Neurologically intact ABD soft Neurovascular intact Sensation intact distally Intact pulses distally Dorsiflexion/Plantar flexion intact Incision: scant drainage} XR AP&Lat of hip shows well placed\fixed THA  Assessment:   7 Days Post-Op Procedure(s) (LRB): TOTAL HIP ARTHROPLASTY (Left) ADDITIONAL DIAGNOSIS:  Acute Blood Loss Anemia, delirium (improving)  Plan: PT/OT WBAT, THA  posterior precautions  DVT Prophylaxis: SCDx72 hrs, ASA 325 mg BID x 2 weeks  DISCHARGE PLAN: Skilled Nursing Facility/Rehab when bed available   DISCHARGE NEEDS: Walker and 3-in-1 comode seat

## 2012-05-22 ENCOUNTER — Encounter (HOSPITAL_COMMUNITY): Payer: Self-pay | Admitting: *Deleted

## 2012-05-22 ENCOUNTER — Emergency Department (HOSPITAL_COMMUNITY)
Admission: EM | Admit: 2012-05-22 | Discharge: 2012-05-22 | Disposition: A | Discharge status: Home / Self Care | Payer: Medicare Other | Attending: Emergency Medicine | Admitting: Emergency Medicine

## 2012-05-22 DIAGNOSIS — E669 Obesity, unspecified: Secondary | ICD-10-CM | POA: Insufficient documentation

## 2012-05-22 DIAGNOSIS — M25559 Pain in unspecified hip: Secondary | ICD-10-CM | POA: Insufficient documentation

## 2012-05-22 DIAGNOSIS — F411 Generalized anxiety disorder: Secondary | ICD-10-CM | POA: Insufficient documentation

## 2012-05-22 DIAGNOSIS — Z8249 Family history of ischemic heart disease and other diseases of the circulatory system: Secondary | ICD-10-CM | POA: Insufficient documentation

## 2012-05-22 DIAGNOSIS — Z888 Allergy status to other drugs, medicaments and biological substances status: Secondary | ICD-10-CM | POA: Insufficient documentation

## 2012-05-22 DIAGNOSIS — F329 Major depressive disorder, single episode, unspecified: Secondary | ICD-10-CM | POA: Insufficient documentation

## 2012-05-22 DIAGNOSIS — M25552 Pain in left hip: Secondary | ICD-10-CM

## 2012-05-22 DIAGNOSIS — I10 Essential (primary) hypertension: Secondary | ICD-10-CM | POA: Insufficient documentation

## 2012-05-22 DIAGNOSIS — F3289 Other specified depressive episodes: Secondary | ICD-10-CM | POA: Insufficient documentation

## 2012-05-22 DIAGNOSIS — K219 Gastro-esophageal reflux disease without esophagitis: Secondary | ICD-10-CM | POA: Insufficient documentation

## 2012-05-22 DIAGNOSIS — F17200 Nicotine dependence, unspecified, uncomplicated: Secondary | ICD-10-CM | POA: Insufficient documentation

## 2012-05-22 MED ORDER — HYDROMORPHONE HCL PF 2 MG/ML IJ SOLN
2.0000 mg | Freq: Once | INTRAMUSCULAR | Status: AC
  Administered 2012-05-22: 2 mg via INTRAMUSCULAR
  Filled 2012-05-22: qty 1

## 2012-05-22 MED ORDER — OXYCODONE-ACETAMINOPHEN 10-325 MG PO TABS: 1.0000 | ORAL_TABLET | Freq: Four times a day (QID) | ORAL | Status: DC | PRN

## 2012-05-22 NOTE — ED Notes (Signed)
Pt's wife states he is just here for pain medicine because he is out at home.

## 2012-05-22 NOTE — ED Notes (Signed)
Had hip replacement  2 weeks ago at [facility], chest pain and bil arm pain

## 2012-05-22 NOTE — ED Notes (Signed)
Pt discharged. Pt stable at time of discharge. Medications reviewed pt has no questions regarding discharge at this time. Pt voiced understanding of discharge instructions.  

## 2012-05-22 NOTE — ED Provider Notes (Signed)
History     CSN: [id number]  Arrival date & time 05/22/12  1736   First MD Initiated Contact with Patient 05/22/12 1802      Chief Complaint  Patient presents with  . Chest Pain   Chief complaint:  Left hip pain  [not chest pain] (Consider location/radiation/quality/duration/timing/severity/associated sxs/prior treatment) HPI....status post left hip replacement approximately 2 weeks ago by Dr. Gean Birchwood.  Now with persistent pain in hip area.  Recent x-ray on Saturday was negative.  He has run out of pain medicine. Severity is moderate. Palpation makes pain worse. No fevers, sweats, chills, tenderness on posterior aspect of thigh or calf.  No chest pain or shortness of breath   Past Medical History  Diagnosis Date  . COPD (chronic obstructive pulmonary disease)   . Hypertension   . Chronic back pain   . Anxiety disorder   . Hyperlipidemia   . Depression   . Obesity   . History of alcohol abuse   . History of cocaine abuse   . Pneumonia   . CHF (congestive heart failure)   . GERD (gastroesophageal reflux disease)   . Headache   . Arthritis     Past Surgical History  Procedure Date  . 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   . Hip surgery     rt  . Cholecystectomy   . Back surgery   . Nasal/sinus endoscopy   . Total hip arthroplasty 05/08/2012    Procedure: TOTAL HIP ARTHROPLASTY;  Surgeon: Nestor Lewandowsky, MD;  Location: [facility] OR;  Service: Orthopedics;  Laterality: Left;  . Joint replacement     Family History  Problem Relation Age of Onset  . Coronary artery disease      family h/o  . Heart attack      family h/o    History  Substance Use Topics  . Smoking status: Current Every Day Smoker -- 0.5 packs/day for 39 years    Types: Cigarettes  . Smokeless tobacco: Not on file  . Alcohol Use: No     drinks occasionally      Review of  Systems  All other systems reviewed and are negative.    Allergies  Ativan; Dilaudid; and Haloperidol decanoate  Home Medications   Current Outpatient Rx  Name Route Sig Dispense Refill  . ALBUTEROL SULFATE HFA 108 (90 BASE) MCG/ACT IN AERS Inhalation Inhale 2 puffs into the lungs every 6 (six) hours as needed. For shortness of breath    . ALPRAZOLAM 1 MG PO TABS Oral Take 1 tablet (1 mg total) by mouth 3 (three) times daily as needed for anxiety. For anxiety 30 tablet 0  . AMLODIPINE BESYLATE 5 MG PO TABS Oral Take 5 mg by mouth daily.      . ASPIRIN 81 MG PO TABS Oral Take 1 tablet (81 mg total) by mouth daily. 30 tablet 0  . ASPIRIN EC 325 MG PO TBEC Oral Take 1 tablet (325 mg total) by mouth daily. 30 tablet 0    FOR 2 WEEKS.  . BUDESONIDE-FORMOTEROL FUMARATE 160-4.5 MCG/ACT IN AERO Inhalation Inhale 2 puffs into the lungs 2 (two) times daily.    Marland Kitchen CITALOPRAM HYDROBROMIDE 40 MG PO TABS Oral Take 40 mg by mouth daily.      Marland Kitchen PRO-STAT 64 PO LIQD Per Tube Place 30 mLs into feeding tube 2 (two) times daily.  900 mL   . HYDROXYZINE PAMOATE 25 MG PO CAPS Oral Take 25 mg by mouth 3 (three) times daily as needed. For itching    . ISOSORBIDE MONONITRATE ER 30 MG PO TB24 Oral Take 30 mg by mouth daily.      Marland Kitchen METOPROLOL TARTRATE 25 MG PO TABS Oral Take 25 mg by mouth daily.     . ADULT MULTIVITAMIN W/MINERALS CH Oral Take 1 tablet by mouth daily.    Marland Kitchen NITROGLYCERIN 0.4 MG SL SUBL Sublingual Place 0.4 mg under the tongue every 5 (five) minutes as needed. For chest pain    . OMEPRAZOLE 20 MG PO CPDR Oral Take 20 mg by mouth daily.    . OXYCODONE-ACETAMINOPHEN 10-325 MG PO TABS Oral Take 1 tablet by mouth every 6 (six) hours as needed. For pain 30 tablet 0  . SENNOSIDES-DOCUSATE SODIUM 8.6-50 MG PO TABS Oral Take 1 tablet by mouth 2 (two) times daily.    . TRAZODONE HCL 100 MG PO TABS Oral Take 100 mg by mouth at bedtime.       BP 109/64  Pulse 81  Temp 97.8 F (36.6 C) (Oral)  Resp 22  Ht  6\' 3"  (1.905 m)  Wt 327 lb (148.326 kg)  BMI 40.87 kg/m2  SpO2 100%  Physical Exam  Nursing note and vitals reviewed. Constitutional: He is oriented to person, place, and time. He appears well-developed and well-nourished.  HENT:  Head: Normocephalic and atraumatic.  Eyes: Conjunctivae normal and EOM are normal. Pupils are equal, round, and reactive to light.  Neck: Normal range of motion. Neck supple.  Cardiovascular: Normal rate, regular rhythm and normal heart sounds.   Pulmonary/Chest: Effort normal and breath sounds normal.  Abdominal: Soft. Bowel sounds are normal.  Musculoskeletal:       Left hip: Tenderness to palpation. No evidence of cellulitis or abscess.   Nontender on posterior calf and thigh  Neurological: He is alert and oriented to person, place, and time.  Skin: Skin is warm and dry.  Psychiatric: He has a normal mood and affect.    ED Course  Procedures (including critical care time)  Labs Reviewed - No data to display No results found.   No diagnosis found.  Date: 05/22/2012  Rate: 75  Rhythm: normal sinus rhythm  QRS Axis: normal  Intervals: normal  ST/T Wave abnormalities: normal  Conduction Disutrbances:none  Narrative Interpretation:   Old EKG Reviewed: unchanged    MDM  Patient has had inadequate pain management. No evidence of infection or DVT.   Rx Percocet 10/325 #40. Follow up with orthopedic surgeon.        Donnetta Hutching, MD 05/22/12 2005

## 2012-11-10 ENCOUNTER — Emergency Department (HOSPITAL_COMMUNITY)
Admission: EM | Admit: 2012-11-10 | Discharge: 2012-11-10 | Disposition: A | Discharge status: Home / Self Care | Payer: Medicare Other | Attending: Emergency Medicine | Admitting: Emergency Medicine

## 2012-11-10 ENCOUNTER — Emergency Department (HOSPITAL_COMMUNITY): Payer: Medicare Other

## 2012-11-10 ENCOUNTER — Encounter (HOSPITAL_COMMUNITY): Payer: Self-pay | Admitting: *Deleted

## 2012-11-10 ENCOUNTER — Other Ambulatory Visit: Payer: Self-pay

## 2012-11-10 DIAGNOSIS — M549 Dorsalgia, unspecified: Secondary | ICD-10-CM | POA: Insufficient documentation

## 2012-11-10 DIAGNOSIS — F411 Generalized anxiety disorder: Secondary | ICD-10-CM | POA: Insufficient documentation

## 2012-11-10 DIAGNOSIS — Z862 Personal history of diseases of the blood and blood-forming organs and certain disorders involving the immune mechanism: Secondary | ICD-10-CM | POA: Insufficient documentation

## 2012-11-10 DIAGNOSIS — F329 Major depressive disorder, single episode, unspecified: Secondary | ICD-10-CM | POA: Insufficient documentation

## 2012-11-10 DIAGNOSIS — Z79899 Other long term (current) drug therapy: Secondary | ICD-10-CM | POA: Insufficient documentation

## 2012-11-10 DIAGNOSIS — J449 Chronic obstructive pulmonary disease, unspecified: Secondary | ICD-10-CM | POA: Insufficient documentation

## 2012-11-10 DIAGNOSIS — Z8639 Personal history of other endocrine, nutritional and metabolic disease: Secondary | ICD-10-CM | POA: Insufficient documentation

## 2012-11-10 DIAGNOSIS — R079 Chest pain, unspecified: Secondary | ICD-10-CM | POA: Insufficient documentation

## 2012-11-10 DIAGNOSIS — J4489 Other specified chronic obstructive pulmonary disease: Secondary | ICD-10-CM | POA: Insufficient documentation

## 2012-11-10 DIAGNOSIS — E669 Obesity, unspecified: Secondary | ICD-10-CM | POA: Insufficient documentation

## 2012-11-10 DIAGNOSIS — Z8739 Personal history of other diseases of the musculoskeletal system and connective tissue: Secondary | ICD-10-CM | POA: Insufficient documentation

## 2012-11-10 DIAGNOSIS — G8929 Other chronic pain: Secondary | ICD-10-CM | POA: Insufficient documentation

## 2012-11-10 DIAGNOSIS — M25559 Pain in unspecified hip: Secondary | ICD-10-CM | POA: Insufficient documentation

## 2012-11-10 DIAGNOSIS — F17200 Nicotine dependence, unspecified, uncomplicated: Secondary | ICD-10-CM | POA: Insufficient documentation

## 2012-11-10 DIAGNOSIS — Z7982 Long term (current) use of aspirin: Secondary | ICD-10-CM | POA: Insufficient documentation

## 2012-11-10 DIAGNOSIS — K219 Gastro-esophageal reflux disease without esophagitis: Secondary | ICD-10-CM | POA: Insufficient documentation

## 2012-11-10 DIAGNOSIS — F3289 Other specified depressive episodes: Secondary | ICD-10-CM | POA: Insufficient documentation

## 2012-11-10 DIAGNOSIS — M25552 Pain in left hip: Secondary | ICD-10-CM

## 2012-11-10 DIAGNOSIS — I10 Essential (primary) hypertension: Secondary | ICD-10-CM | POA: Insufficient documentation

## 2012-11-10 DIAGNOSIS — Z8701 Personal history of pneumonia (recurrent): Secondary | ICD-10-CM | POA: Insufficient documentation

## 2012-11-10 DIAGNOSIS — I509 Heart failure, unspecified: Secondary | ICD-10-CM | POA: Insufficient documentation

## 2012-11-10 LAB — BASIC METABOLIC PANEL WITH GFR
BUN: 14 mg/dL (ref 6–23)
CO2: 25 meq/L (ref 19–32)
Calcium: 9.5 mg/dL (ref 8.4–10.5)
Chloride: 98 meq/L (ref 96–112)
Creatinine, Ser: 0.75 mg/dL (ref 0.50–1.35)
GFR calc Af Amer: >90 mL/min
GFR calc non Af Amer: >90 mL/min
Glucose, Bld: 112 mg/dL — ABNORMAL HIGH (ref 70–99)
Potassium: 3.8 meq/L (ref 3.5–5.1)
Sodium: 134 meq/L — ABNORMAL LOW (ref 135–145)

## 2012-11-10 LAB — CBC
MCH: 31.6 pg (ref 26.0–34.0)
MCHC: 34.7 g/dL (ref 30.0–36.0)
Platelets: 243 10*3/uL (ref 150–400)
RDW: 13.3 % (ref 11.5–15.5)

## 2012-11-10 LAB — TROPONIN I: Troponin I: <0.3 ng/mL (ref <0.30)

## 2012-11-10 MED ORDER — KETOROLAC TROMETHAMINE 30 MG/ML IJ SOLN
30.0000 mg | Freq: Once | INTRAMUSCULAR | Status: AC
  Administered 2012-11-10: 30 mg via INTRAVENOUS
  Filled 2012-11-10: qty 1

## 2012-11-10 MED ORDER — MORPHINE SULFATE 4 MG/ML IJ SOLN
6.0000 mg | Freq: Once | INTRAMUSCULAR | Status: AC
  Administered 2012-11-10: 6 mg via INTRAVENOUS
  Filled 2012-11-10: qty 1

## 2012-11-10 MED ORDER — MORPHINE SULFATE 2 MG/ML IJ SOLN
INTRAMUSCULAR | Status: AC
  Filled 2012-11-10: qty 1

## 2012-11-10 MED ORDER — MORPHINE SULFATE 4 MG/ML IJ SOLN
8.0000 mg | Freq: Once | INTRAMUSCULAR | Status: AC
  Administered 2012-11-10: 8 mg via INTRAVENOUS
  Filled 2012-11-10: qty 2

## 2012-11-10 NOTE — ED Provider Notes (Signed)
History    This chart was scribed for Hurman Horn, MD by Charolett Bumpers, ED Scribe. The patient was seen in room APA19/APA19. Patient's care was started at 1358.   CSN: [id number]  Arrival date & time 11/10/12  1340   First MD Initiated Contact with Patient 11/10/12 1358      Chief Complaint  Patient presents with  . Chest Pain  . Hip Pain    The history is provided by the patient. No language interpreter was used.  [name] is a 57 y.o. male who presents to the Emergency Department complaining of 2 days of sudden, sharp left hip pain. He states his pain is aggravated with weight bearing. He denies any pain at baseline. His pain started radiating all the way up and down the entire left side of his body yesterday with bearing weight. He denies any recent falls or trauma. He also complains of pain to the left side of his head and numbness to the tip of the toes on his left foot. He denies any nausea, vomiting, speech or visual disturbances, fevers, confusion, hallucinations. He has a h/o bilateral hip replacements. He reports a h/o stable COPD, chronic back and chest pain. He has a h/o alcohol and cocaine abuse with drug seeking behavior. He takes Percocet every 8 hours for his chronic pain.   Past Medical History  Diagnosis Date  . COPD (chronic obstructive pulmonary disease)   . Hypertension   . Chronic back pain   . Anxiety disorder   . Hyperlipidemia   . Depression   . Obesity   . History of alcohol abuse   . History of cocaine abuse   . Pneumonia   . CHF (congestive heart failure)   . GERD (gastroesophageal reflux disease)   . Headache   . Arthritis     Past Surgical History  Procedure Laterality Date  . 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   . Hip surgery      rt  . Cholecystectomy    . Back surgery    . Nasal/sinus endoscopy     . Total hip arthroplasty  05/08/2012    Procedure: TOTAL HIP ARTHROPLASTY;  Surgeon: Nestor Lewandowsky, MD;  Location: [facility] OR;  Service: Orthopedics;  Laterality: Left;  . Joint replacement      Family History  Problem Relation Age of Onset  . Coronary artery disease      family h/o  . Heart attack      family h/o    History  Substance Use Topics  . Smoking status: Current Every Day Smoker -- 0.50 packs/day for 39 years    Types: Cigarettes  . Smokeless tobacco: Not on file  . Alcohol Use: No     Comment: denies      Review of Systems A complete 10 system review of systems was obtained and all systems are negative except as noted in the HPI and PMH.   Allergies  Ativan; Dilaudid; and Haloperidol decanoate  Home Medications   Current Outpatient Rx  Name  Route  Sig  Dispense  Refill  . albuterol (PROVENTIL HFA;VENTOLIN HFA) 108 (90 BASE) MCG/ACT inhaler   Inhalation   Inhale 2 puffs into the lungs every 6 (six) hours as needed. For shortness of breath         . ALPRAZolam (XANAX) 1 MG  tablet   Oral   Take 1 tablet (1 mg total) by mouth 3 (three) times daily as needed for anxiety. For anxiety   30 tablet   0   . amLODipine (NORVASC) 5 MG tablet   Oral   Take 5 mg by mouth daily.           Marland Kitchen aspirin 81 MG tablet   Oral   Take 1 tablet (81 mg total) by mouth daily.   30 tablet   0   . budesonide-formoterol (SYMBICORT) 160-4.5 MCG/ACT inhaler   Inhalation   Inhale 2 puffs into the lungs 2 (two) times daily.         . citalopram (CELEXA) 40 MG tablet   Oral   Take 40 mg by mouth daily.           . hydrOXYzine (VISTARIL) 25 MG capsule   Oral   Take 25 mg by mouth at bedtime. For itching         . isosorbide mononitrate (IMDUR) 30 MG 24 hr tablet   Oral   Take 30 mg by mouth daily.           . metoprolol tartrate (LOPRESSOR) 25 MG tablet   Oral   Take 25 mg by mouth daily.          . nitroGLYCERIN (NITROSTAT) 0.4 MG SL tablet   Sublingual    Place 0.4 mg under the tongue every 5 (five) minutes as needed. For chest pain         . omeprazole (PRILOSEC) 20 MG capsule   Oral   Take 20 mg by mouth daily.         Marland Kitchen oxyCODONE-acetaminophen (PERCOCET) 10-325 MG per tablet   Oral   Take 1 tablet by mouth every 8 (eight) hours as needed for pain.         . traZODone (DESYREL) 100 MG tablet   Oral   Take 100 mg by mouth at bedtime.          . vitamin C (ASCORBIC ACID) 500 MG tablet   Oral   Take 500 mg by mouth daily.           BP 127/75  Pulse 68  Temp(Src) 97.3 F (36.3 C) (Oral)  Resp 13  SpO2 98%  Physical Exam  Nursing note and vitals reviewed. Constitutional:  Awake, alert, nontoxic appearance.  HENT:  Head: Atraumatic.  Eyes: Right eye exhibits no discharge. Left eye exhibits no discharge.  Neck: Neck supple.  Pulmonary/Chest: Effort normal. He exhibits no tenderness.  Abdominal: Soft. There is no tenderness. There is no rebound.  Musculoskeletal: He exhibits tenderness. He exhibits no edema.  Baseline ROM, no obvious new focal weakness. Left foot with decreased sensation to light touch on the tip of toes. DP/PT pulses intact. Capillary refill less than 2 seconds. Left lateral hip is tender to palpation with no redness, warmth or induration noted to skin. Pain with axial load and traction to left hip. Mild pain with external and internal rotation. Lower back is non-tender.   Neurological:  Mental status and motor strength appears baseline for patient and situation.  Skin: No rash noted.  Psychiatric: He has a normal mood and affect.    ED Course  Procedures (including critical care time) ECG: Sinus rhythm, first degree AV block, ventricular rate 60, normal axis, no acute ischemic changes noted, compared with July 2007 first degree AV block now present  DIAGNOSTIC STUDIES: Oxygen Saturation is  96% on room air, normal by my interpretation.    COORDINATION OF CARE:  2:11 PM-Patient / Family /  Caregiver understand and agree with initial ED impression and plan with expectations set for ED visit.  3:06 PM-Consult complete with [facility]. Patient case explained and discussed. Pt will be d/c home with walker and f/u in office. Call ended at 03:08 PM  3:14 PM-Consult complete with pain management. Patient case explained and discussed. Pt will f/u in office next week. Call ended at 3:16 PM  Results for orders placed during the hospital encounter of 11/10/12  CBC      Result Value Range   WBC 10.1  4.0 - 10.5 K/uL   RBC 4.46  4.22 - 5.81 MIL/uL   Hemoglobin 14.1  13.0 - 17.0 g/dL   HCT 16.1  09.6 - 04.5 %   MCV 91.0  78.0 - 100.0 fL   MCH 31.6  26.0 - 34.0 pg   MCHC 34.7  30.0 - 36.0 g/dL   RDW 40.9  81.1 - 91.4 %   Platelets 243  150 - 400 K/uL  BASIC METABOLIC PANEL      Result Value Range   Sodium 134 (*) 135 - 145 mEq/L   Potassium 3.8  3.5 - 5.1 mEq/L   Chloride 98  96 - 112 mEq/L   CO2 25  19 - 32 mEq/L   Glucose, Bld 112 (*) 70 - 99 mg/dL   BUN 14  6 - 23 mg/dL   Creatinine, Ser 7.82  0.50 - 1.35 mg/dL   Calcium 9.5  8.4 - 95.6 mg/dL   GFR calc non Af Amer >90  >90 mL/min   GFR calc Af Amer >90  >90 mL/min  TROPONIN I      Result Value Range   Troponin I <0.30  <0.30 ng/mL     Dg Hip Complete Left  11/10/2012  *RADIOLOGY REPORT*  Clinical Data: Left hip pain.  Left total hip arthroplasty 05/08/2012.  LEFT HIP - COMPLETE 2+ VIEW  Comparison: 05/08/2012.  Findings: The left sided femoral and acetabular components are intact.  No complicating features are demonstrated.  No change since prior examination.  The pubic symphysis and SI joints are intact.  The right hip prosthesis is stable.  IMPRESSION: Well seated components of a left total hip arthroplasty without complicating features.  No change since prior films.   Original Report Authenticated By: Rudie Meyer, M.D.      1. Left hip pain       MDM   I personally performed the services described in  this documentation, which was scribed in my presence. The recorded information has been reviewed and is accurate.  I doubt any other [facility] precluding discharge at this time including, but not necessarily limited to the following:SBI.      Hurman Horn, MD 11/10/12 2156

## 2012-11-10 NOTE — ED Notes (Signed)
MD at bedside. 

## 2012-11-10 NOTE — ED Notes (Signed)
Pt with left hip pain x 3 days, hx of left hip surgeries, 2 days ago pain radiating to left chest, takes 81mg  ASA everyday and has had today's dose, took 3 SL NTG earlier today with slight improvement in pain, pain with standing and ambulating to left chest-sharp

## 2012-11-10 NOTE — ED Notes (Signed)
Left hip pain that started x 2 days ago.  States pain is shooting from left hip to left chest and radiating into left shoulder.  C/o sob and dizziness.

## 2013-03-19 DIAGNOSIS — R079 Chest pain, unspecified: Secondary | ICD-10-CM

## 2013-03-20 DIAGNOSIS — R079 Chest pain, unspecified: Secondary | ICD-10-CM

## 2013-09-11 ENCOUNTER — Inpatient Hospital Stay (HOSPITAL_COMMUNITY)
Admission: EM | Admit: 2013-09-11 | Discharge: 2013-09-14 | DRG: 287 | Disposition: A | Discharge status: Home / Self Care | Payer: Medicare Other | Attending: Cardiovascular Disease | Admitting: Cardiovascular Disease

## 2013-09-11 ENCOUNTER — Emergency Department (HOSPITAL_COMMUNITY): Payer: Medicare Other

## 2013-09-11 ENCOUNTER — Encounter (HOSPITAL_COMMUNITY): Payer: Self-pay | Admitting: Emergency Medicine

## 2013-09-11 DIAGNOSIS — M87059 Idiopathic aseptic necrosis of unspecified femur: Secondary | ICD-10-CM

## 2013-09-11 DIAGNOSIS — I25118 Atherosclerotic heart disease of native coronary artery with other forms of angina pectoris: Secondary | ICD-10-CM | POA: Diagnosis present

## 2013-09-11 DIAGNOSIS — R079 Chest pain, unspecified: Secondary | ICD-10-CM | POA: Diagnosis present

## 2013-09-11 DIAGNOSIS — M87052 Idiopathic aseptic necrosis of left femur: Secondary | ICD-10-CM

## 2013-09-11 DIAGNOSIS — J449 Chronic obstructive pulmonary disease, unspecified: Secondary | ICD-10-CM

## 2013-09-11 DIAGNOSIS — Z6841 Body Mass Index (BMI) 40.0 and over, adult: Secondary | ICD-10-CM

## 2013-09-11 DIAGNOSIS — M545 Low back pain, unspecified: Secondary | ICD-10-CM

## 2013-09-11 DIAGNOSIS — Z96649 Presence of unspecified artificial hip joint: Secondary | ICD-10-CM

## 2013-09-11 DIAGNOSIS — F17200 Nicotine dependence, unspecified, uncomplicated: Secondary | ICD-10-CM | POA: Diagnosis present

## 2013-09-11 DIAGNOSIS — F411 Generalized anxiety disorder: Secondary | ICD-10-CM | POA: Diagnosis present

## 2013-09-11 DIAGNOSIS — I10 Essential (primary) hypertension: Secondary | ICD-10-CM

## 2013-09-11 DIAGNOSIS — R55 Syncope and collapse: Principal | ICD-10-CM | POA: Diagnosis present

## 2013-09-11 DIAGNOSIS — F339 Major depressive disorder, recurrent, unspecified: Secondary | ICD-10-CM

## 2013-09-11 DIAGNOSIS — F329 Major depressive disorder, single episode, unspecified: Secondary | ICD-10-CM | POA: Diagnosis present

## 2013-09-11 DIAGNOSIS — M549 Dorsalgia, unspecified: Secondary | ICD-10-CM | POA: Diagnosis present

## 2013-09-11 DIAGNOSIS — Z9861 Coronary angioplasty status: Secondary | ICD-10-CM

## 2013-09-11 DIAGNOSIS — I200 Unstable angina: Secondary | ICD-10-CM

## 2013-09-11 DIAGNOSIS — E785 Hyperlipidemia, unspecified: Secondary | ICD-10-CM | POA: Diagnosis present

## 2013-09-11 DIAGNOSIS — M25559 Pain in unspecified hip: Secondary | ICD-10-CM | POA: Diagnosis present

## 2013-09-11 DIAGNOSIS — Z79899 Other long term (current) drug therapy: Secondary | ICD-10-CM

## 2013-09-11 DIAGNOSIS — G4733 Obstructive sleep apnea (adult) (pediatric): Secondary | ICD-10-CM | POA: Diagnosis present

## 2013-09-11 DIAGNOSIS — I509 Heart failure, unspecified: Secondary | ICD-10-CM | POA: Diagnosis present

## 2013-09-11 DIAGNOSIS — Z7982 Long term (current) use of aspirin: Secondary | ICD-10-CM

## 2013-09-11 DIAGNOSIS — K219 Gastro-esophageal reflux disease without esophagitis: Secondary | ICD-10-CM | POA: Diagnosis present

## 2013-09-11 DIAGNOSIS — J4489 Other specified chronic obstructive pulmonary disease: Secondary | ICD-10-CM | POA: Diagnosis present

## 2013-09-11 DIAGNOSIS — G8929 Other chronic pain: Secondary | ICD-10-CM | POA: Diagnosis present

## 2013-09-11 DIAGNOSIS — F3289 Other specified depressive episodes: Secondary | ICD-10-CM | POA: Diagnosis present

## 2013-09-11 DIAGNOSIS — I2510 Atherosclerotic heart disease of native coronary artery without angina pectoris: Secondary | ICD-10-CM | POA: Diagnosis present

## 2013-09-11 DIAGNOSIS — G4730 Sleep apnea, unspecified: Secondary | ICD-10-CM | POA: Diagnosis present

## 2013-09-11 HISTORY — DX: Essential (primary) hypertension: I10

## 2013-09-11 HISTORY — DX: Atherosclerotic heart disease of native coronary artery without angina pectoris: I25.10

## 2013-09-11 LAB — COMPREHENSIVE METABOLIC PANEL
ALBUMIN: 3.8 g/dL (ref 3.5–5.2)
ALK PHOS: 51 U/L (ref 39–117)
ALT: 25 U/L (ref 0–53)
AST: 22 U/L (ref 0–37)
BILIRUBIN TOTAL: 0.3 mg/dL (ref 0.3–1.2)
BUN: 16 mg/dL (ref 6–23)
CHLORIDE: 101 meq/L (ref 96–112)
CO2: 25 mEq/L (ref 19–32)
CREATININE: 1 mg/dL (ref 0.50–1.35)
Calcium: 9.7 mg/dL (ref 8.4–10.5)
GFR calc non Af Amer: 83 mL/min — ABNORMAL LOW (ref >90)
GLUCOSE: 125 mg/dL — AB (ref 70–99)
Potassium: 3.6 mEq/L — ABNORMAL LOW (ref 3.7–5.3)
Sodium: 141 mEq/L (ref 137–147)
Total Protein: 7.8 g/dL (ref 6.0–8.3)

## 2013-09-11 LAB — CBC WITH DIFFERENTIAL/PLATELET
BASOS PCT: 1 % (ref 0–1)
Basophils Absolute: 0.1 10*3/uL (ref 0.0–0.1)
EOS ABS: 0.7 10*3/uL (ref 0.0–0.7)
Eosinophils Relative: 6 % — ABNORMAL HIGH (ref 0–5)
HEMATOCRIT: 42.4 % (ref 39.0–52.0)
HEMOGLOBIN: 14.7 g/dL (ref 13.0–17.0)
LYMPHS ABS: 4 10*3/uL (ref 0.7–4.0)
Lymphocytes Relative: 36 % (ref 12–46)
MCH: 32.9 pg (ref 26.0–34.0)
MCHC: 34.7 g/dL (ref 30.0–36.0)
MCV: 94.9 fL (ref 78.0–100.0)
MONO ABS: 1.1 10*3/uL — AB (ref 0.1–1.0)
MONOS PCT: 10 % (ref 3–12)
NEUTROS ABS: 5.3 10*3/uL (ref 1.7–7.7)
Neutrophils Relative %: 47 % (ref 43–77)
Platelets: 258 10*3/uL (ref 150–400)
RBC: 4.47 MIL/uL (ref 4.22–5.81)
RDW: 12.3 % (ref 11.5–15.5)
WBC: 11.3 10*3/uL — ABNORMAL HIGH (ref 4.0–10.5)

## 2013-09-11 LAB — TROPONIN I

## 2013-09-11 LAB — D-DIMER, QUANTITATIVE (NOT AT ARMC): D DIMER QUANT: 0.35 ug{FEU}/mL (ref 0.00–0.48)

## 2013-09-11 MED ORDER — HYDROCHLOROTHIAZIDE 25 MG PO TABS
25.0000 mg | ORAL_TABLET | Freq: Every day | ORAL | Status: DC
  Administered 2013-09-12 – 2013-09-14 (×3): 25 mg via ORAL
  Filled 2013-09-11 (×3): qty 1

## 2013-09-11 MED ORDER — BUDESONIDE-FORMOTEROL FUMARATE 160-4.5 MCG/ACT IN AERO
INHALATION_SPRAY | RESPIRATORY_TRACT | Status: AC
  Filled 2013-09-11: qty 6

## 2013-09-11 MED ORDER — NITROGLYCERIN 2 % TD OINT
1.0000 [in_us] | TOPICAL_OINTMENT | Freq: Once | TRANSDERMAL | Status: AC
  Administered 2013-09-11: 1 [in_us] via TOPICAL
  Filled 2013-09-11: qty 1

## 2013-09-11 MED ORDER — ONDANSETRON HCL 4 MG/2ML IJ SOLN: 4.0000 mg | Freq: Four times a day (QID) | INTRAMUSCULAR | Status: DC | PRN

## 2013-09-11 MED ORDER — CITALOPRAM HYDROBROMIDE 40 MG PO TABS
40.0000 mg | ORAL_TABLET | Freq: Every day | ORAL | Status: DC
  Administered 2013-09-12 – 2013-09-14 (×3): 40 mg via ORAL
  Filled 2013-09-11 (×2): qty 1
  Filled 2013-09-11: qty 2

## 2013-09-11 MED ORDER — OXYCODONE-ACETAMINOPHEN 10-325 MG PO TABS: 1.0000 | ORAL_TABLET | Freq: Three times a day (TID) | ORAL | Status: DC | PRN

## 2013-09-11 MED ORDER — MORPHINE SULFATE 2 MG/ML IJ SOLN
2.0000 mg | INTRAMUSCULAR | Status: DC | PRN
  Administered 2013-09-12 (×4): 2 mg via INTRAVENOUS
  Filled 2013-09-11 (×4): qty 1

## 2013-09-11 MED ORDER — POTASSIUM CHLORIDE CRYS ER 20 MEQ PO TBCR
20.0000 meq | EXTENDED_RELEASE_TABLET | Freq: Every day | ORAL | Status: DC
  Administered 2013-09-12 – 2013-09-14 (×3): 20 meq via ORAL
  Filled 2013-09-11 (×4): qty 1

## 2013-09-11 MED ORDER — ONDANSETRON HCL 4 MG PO TABS: 4.0000 mg | ORAL_TABLET | Freq: Four times a day (QID) | ORAL | Status: DC | PRN

## 2013-09-11 MED ORDER — OXYCODONE-ACETAMINOPHEN 5-325 MG PO TABS
1.0000 | ORAL_TABLET | Freq: Three times a day (TID) | ORAL | Status: DC | PRN
  Administered 2013-09-11 – 2013-09-13 (×4): 1 via ORAL
  Filled 2013-09-11 (×5): qty 1

## 2013-09-11 MED ORDER — NITROGLYCERIN 0.4 MG SL SUBL
0.4000 mg | SUBLINGUAL_TABLET | SUBLINGUAL | Status: DC | PRN
  Administered 2013-09-11 (×2): 0.4 mg via SUBLINGUAL
  Filled 2013-09-11: qty 25

## 2013-09-11 MED ORDER — ENOXAPARIN SODIUM 80 MG/0.8ML ~~LOC~~ SOLN
70.0000 mg | SUBCUTANEOUS | Status: DC
  Administered 2013-09-12: 70 mg via SUBCUTANEOUS
  Filled 2013-09-11 (×2): qty 0.8

## 2013-09-11 MED ORDER — TRAZODONE HCL 100 MG PO TABS
100.0000 mg | ORAL_TABLET | Freq: Every evening | ORAL | Status: DC | PRN
  Filled 2013-09-11: qty 1

## 2013-09-11 MED ORDER — SODIUM CHLORIDE 0.9 % IJ SOLN
3.0000 mL | Freq: Two times a day (BID) | INTRAMUSCULAR | Status: DC
  Administered 2013-09-11 – 2013-09-13 (×3): 3 mL via INTRAVENOUS

## 2013-09-11 MED ORDER — MORPHINE SULFATE 4 MG/ML IJ SOLN
4.0000 mg | Freq: Once | INTRAMUSCULAR | Status: AC
  Administered 2013-09-11: 4 mg via INTRAVENOUS
  Filled 2013-09-11: qty 1

## 2013-09-11 MED ORDER — PANTOPRAZOLE SODIUM 40 MG PO TBEC
40.0000 mg | DELAYED_RELEASE_TABLET | Freq: Every day | ORAL | Status: DC
Start: 2013-09-12 — End: 2013-09-14
  Administered 2013-09-12 – 2013-09-14 (×3): 40 mg via ORAL
  Filled 2013-09-11 (×4): qty 1

## 2013-09-11 MED ORDER — BUDESONIDE-FORMOTEROL FUMARATE 160-4.5 MCG/ACT IN AERO
2.0000 | INHALATION_SPRAY | Freq: Two times a day (BID) | RESPIRATORY_TRACT | Status: DC
  Administered 2013-09-11 – 2013-09-14 (×5): 2 via RESPIRATORY_TRACT
  Filled 2013-09-11 (×2): qty 6

## 2013-09-11 MED ORDER — AMLODIPINE BESYLATE 5 MG PO TABS
5.0000 mg | ORAL_TABLET | Freq: Every day | ORAL | Status: DC
  Administered 2013-09-12 – 2013-09-14 (×3): 5 mg via ORAL
  Filled 2013-09-11 (×3): qty 1

## 2013-09-11 MED ORDER — OXYCODONE HCL 5 MG PO TABS
5.0000 mg | ORAL_TABLET | Freq: Three times a day (TID) | ORAL | Status: DC | PRN
  Administered 2013-09-11 – 2013-09-13 (×4): 5 mg via ORAL
  Filled 2013-09-11 (×5): qty 1

## 2013-09-11 MED ORDER — ALPRAZOLAM 0.5 MG PO TABS
1.0000 mg | ORAL_TABLET | Freq: Three times a day (TID) | ORAL | Status: DC | PRN
  Administered 2013-09-11 – 2013-09-14 (×6): 1 mg via ORAL
  Filled 2013-09-11: qty 2
  Filled 2013-09-11 (×2): qty 1
  Filled 2013-09-11 (×3): qty 2

## 2013-09-11 MED ORDER — ASPIRIN 81 MG PO CHEW
81.0000 mg | CHEWABLE_TABLET | Freq: Every day | ORAL | Status: DC
  Administered 2013-09-12 – 2013-09-14 (×3): 81 mg via ORAL
  Filled 2013-09-11 (×6): qty 1

## 2013-09-11 MED ORDER — SODIUM CHLORIDE 0.9 % IV SOLN: 250.0000 mL | INTRAVENOUS | Status: DC | PRN

## 2013-09-11 MED ORDER — METOPROLOL TARTRATE 25 MG PO TABS
25.0000 mg | ORAL_TABLET | Freq: Every day | ORAL | Status: DC
  Administered 2013-09-12: 25 mg via ORAL
  Filled 2013-09-11 (×2): qty 1

## 2013-09-11 MED ORDER — ASPIRIN 325 MG PO TABS
325.0000 mg | ORAL_TABLET | Freq: Once | ORAL | Status: AC
  Administered 2013-09-11: 325 mg via ORAL
  Filled 2013-09-11: qty 1

## 2013-09-11 MED ORDER — FUROSEMIDE 20 MG PO TABS
20.0000 mg | ORAL_TABLET | Freq: Every day | ORAL | Status: DC
  Administered 2013-09-12 – 2013-09-13 (×2): 20 mg via ORAL
  Filled 2013-09-11 (×3): qty 1

## 2013-09-11 MED ORDER — MELOXICAM 7.5 MG PO TABS
15.0000 mg | ORAL_TABLET | Freq: Every day | ORAL | Status: DC
  Administered 2013-09-12 – 2013-09-14 (×3): 15 mg via ORAL
  Filled 2013-09-11: qty 2
  Filled 2013-09-11: qty 1
  Filled 2013-09-11: qty 2
  Filled 2013-09-11: qty 1
  Filled 2013-09-11 (×2): qty 2

## 2013-09-11 MED ORDER — SODIUM CHLORIDE 0.9 % IJ SOLN: 3.0000 mL | INTRAMUSCULAR | Status: DC | PRN

## 2013-09-11 MED ORDER — MORPHINE SULFATE 2 MG/ML IJ SOLN
2.0000 mg | Freq: Once | INTRAMUSCULAR | Status: AC
  Administered 2013-09-11: 2 mg via INTRAVENOUS
  Filled 2013-09-11: qty 1

## 2013-09-11 MED ORDER — HYDROXYZINE HCL 25 MG PO TABS
25.0000 mg | ORAL_TABLET | Freq: Every day | ORAL | Status: DC
  Administered 2013-09-11 – 2013-09-13 (×3): 25 mg via ORAL
  Filled 2013-09-11 (×4): qty 1

## 2013-09-11 MED ORDER — ISOSORBIDE MONONITRATE ER 30 MG PO TB24
30.0000 mg | ORAL_TABLET | Freq: Every day | ORAL | Status: DC
  Administered 2013-09-12 – 2013-09-14 (×3): 30 mg via ORAL
  Filled 2013-09-11 (×3): qty 1

## 2013-09-11 NOTE — ED Notes (Signed)
Patient complaining of worsening pain. Requesting pain medication. MD aware.

## 2013-09-11 NOTE — H&P (Signed)
PCP:   [name],[name], MD   Chief Complaint:  Passed out  HPI: 58 year old male who  has a past medical history of COPD (chronic obstructive pulmonary disease); Hypertension; Chronic back pain; Anxiety disorder; Hyperlipidemia; Depression; Obesity; History of alcohol abuse; History of cocaine abuse; Pneumonia; CHF (congestive heart failure); GERD (gastroesophageal reflux disease); Headache(784.0); and Arthritis. Today presented to the ED after patient had an episode of chest pain with profuse sweating and passing out while patient was visiting one of his friends at this nursing home. The chest pain is stabbing in character radiates to the jaw and left arm, patient does have a history of CAD and status post coronary stent placed in 2000 at [facility]. Patient takes aspirin at home. He denies nausea vomiting or diarrhea. No fever dysuria urgency or frequency of urination. Patient says that he has gout while he was standing near a recliner and he did not fall to the ground but fell in the recliner, he did not have seizure-like activity and he passed out for 3-4 minutes. In the ED patient continues to have left-sided chest pain, EKG shows no significant ST-T changes cardiac enzymes are negative. Patient's sister died fluid is ago after a massive heart attack.  Allergies:   Allergies  Allergen Reactions  . Ativan [Lorazepam] Other (See Comments)    "out of mind"  . Dilaudid [Hydromorphone Hcl] Other (See Comments)    " out of mind"  . Haloperidol Decanoate       Past Medical History  Diagnosis Date  . COPD (chronic obstructive pulmonary disease)   . Hypertension   . Chronic back pain   . Anxiety disorder   . Hyperlipidemia   . Depression   . Obesity   . History of alcohol abuse   . History of cocaine abuse   . Pneumonia   . CHF (congestive heart failure)   . GERD (gastroesophageal reflux disease)   . Headache(784.0)   . Arthritis     Past Surgical History  Procedure Laterality Date   . 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   . Hip surgery      rt  . Cholecystectomy    . Back surgery    . Nasal/sinus endoscopy    . Total hip arthroplasty  05/08/2012    Procedure: TOTAL HIP ARTHROPLASTY;  Surgeon: Kerin Salen, MD;  Location: [facility];  Service: Orthopedics;  Laterality: Left;  . Joint replacement      Prior to Admission medications   Medication Sig Start Date End Date Taking? Authorizing Provider  ALPRAZolam Duanne Moron) 1 MG tablet Take 1 tablet (1 mg total) by mouth 3 (three) times daily as needed for anxiety. For anxiety 05/15/12  Yes Hosie Poisson, MD  amLODipine (NORVASC) 5 MG tablet Take 5 mg by mouth daily.     Yes Historical Provider, MD  aspirin 81 MG tablet Take 1 tablet (81 mg total) by mouth daily. 05/31/12  Yes Hosie Poisson, MD  budesonide-formoterol (SYMBICORT) 160-4.5 MCG/ACT inhaler Inhale 2 puffs into the lungs 2 (two) times daily.   Yes Historical Provider, MD  citalopram (CELEXA) 40 MG tablet Take 40 mg by mouth daily.     Yes Historical Provider, MD  furosemide (LASIX) 20 MG tablet Take 1 tablet by mouth daily. 08/22/13  Yes Historical Provider, MD  hydrochlorothiazide (HYDRODIURIL) 25 MG tablet Take 1 tablet by mouth  daily. 09/05/13  Yes Historical Provider, MD  hydrOXYzine (VISTARIL) 25 MG capsule Take 25 mg by mouth at bedtime. For itching   Yes Historical Provider, MD  ipratropium-albuterol (DUONEB) 0.5-2.5 (3) MG/3ML SOLN Take 3 mLs by nebulization 2 (two) times daily.   Yes Historical Provider, MD  isosorbide mononitrate (IMDUR) 30 MG 24 hr tablet Take 30 mg by mouth daily.     Yes Historical Provider, MD  meloxicam (MOBIC) 15 MG tablet Take 1 tablet by mouth daily. 08/13/13  Yes Historical Provider, MD  metoprolol tartrate (LOPRESSOR) 25 MG tablet Take 25 mg by mouth daily.    Yes Historical Provider, MD  nitroGLYCERIN  (NITROSTAT) 0.4 MG SL tablet Place 0.4 mg under the tongue every 5 (five) minutes as needed. For chest pain   Yes Historical Provider, MD  omeprazole (PRILOSEC) 20 MG capsule Take 20 mg by mouth daily.   Yes Historical Provider, MD  oxyCODONE-acetaminophen (PERCOCET) 10-325 MG per tablet Take 1 tablet by mouth every 8 (eight) hours as needed for pain. 05/22/12  Yes Nat Christen, MD  potassium chloride SA (K-DUR,KLOR-CON) 20 MEQ tablet Take 1 tablet by mouth daily. 08/13/13  Yes Historical Provider, MD  traZODone (DESYREL) 100 MG tablet Take 100 mg by mouth at bedtime as needed for sleep.    Yes Historical Provider, MD  vitamin C (ASCORBIC ACID) 500 MG tablet Take 500 mg by mouth daily.   Yes Historical Provider, MD    Social History:  reports that he has been smoking Cigarettes.  He has a 19.5 pack-year smoking history. He does not have any smokeless tobacco history on file. He reports that he does not drink alcohol or use illicit drugs.  Family History  Problem Relation Age of Onset  . Coronary artery disease      family h/o  . Heart attack      family h/o     All the positives are listed in BOLD  Review of Systems:  HEENT: Headache, blurred vision, runny nose, sore throat Neck: Hypothyroidism, hyperthyroidism,,lymphadenopathy Chest : Shortness of breath, history of COPD, Asthma Heart : Chest pain, history of coronary arterey disease GI:  Nausea, vomiting, diarrhea, constipation, GERD GU: Dysuria, urgency, frequency of urination, hematuria Neuro: Stroke, seizures, syncope Psych: Depression, anxiety, hallucinations   Physical Exam: Blood pressure 80/64, pulse 81, temperature 97.6 F (36.4 C), temperature source Oral, resp. rate 19, height '6\' 3"'  (1.905 m), weight 157.852 kg (348 lb), SpO2 98.00%. Constitutional:   Patient is a well-developed and well-nourished male in no acute distress and cooperative with exam. Head: Normocephalic and atraumatic Mouth: Mucus membranes moist Eyes:  PERRL, EOMI, conjunctivae normal Neck: Supple, No Thyromegaly Cardiovascular: RRR, S1 normal, S2 normal Pulmonary/Chest: CTAB, no wheezes, rales, or rhonchi Abdominal: Soft. Non-tender, non-distended, bowel sounds are normal, no masses, organomegaly, or guarding present.  Neurological: A&O x3, Strenght is normal and symmetric bilaterally, cranial nerve II-XII are grossly intact, no focal motor deficit, sensory intact to light touch bilaterally.  Extremities : No Cyanosis, Clubbing or Edema   Labs on Admission:  Results for orders placed during the hospital encounter of 09/11/13 (from the past 48 hour(s))  CBC WITH DIFFERENTIAL     Status: Abnormal   Collection Time    09/11/13  7:37 PM      Result Value Range   WBC 11.3 (*) 4.0 - 10.5 K/uL   RBC 4.47  4.22 - 5.81 MIL/uL   Hemoglobin 14.7  13.0 - 17.0 g/dL   HCT 42.4  39.0 - 52.0 %   MCV 94.9  78.0 - 100.0 fL   MCH 32.9  26.0 - 34.0 pg   MCHC 34.7  30.0 - 36.0 g/dL   RDW 12.3  11.5 - 15.5 %   Platelets 258  150 - 400 K/uL   Neutrophils Relative % 47  43 - 77 %   Neutro Abs 5.3  1.7 - 7.7 K/uL   Lymphocytes Relative 36  12 - 46 %   Lymphs Abs 4.0  0.7 - 4.0 K/uL   Monocytes Relative 10  3 - 12 %   Monocytes Absolute 1.1 (*) 0.1 - 1.0 K/uL   Eosinophils Relative 6 (*) 0 - 5 %   Eosinophils Absolute 0.7  0.0 - 0.7 K/uL   Basophils Relative 1  0 - 1 %   Basophils Absolute 0.1  0.0 - 0.1 K/uL  COMPREHENSIVE METABOLIC PANEL     Status: Abnormal   Collection Time    09/11/13  7:37 PM      Result Value Range   Sodium 141  137 - 147 mEq/L   Potassium 3.6 (*) 3.7 - 5.3 mEq/L   Chloride 101  96 - 112 mEq/L   CO2 25  19 - 32 mEq/L   Glucose, Bld 125 (*) 70 - 99 mg/dL   BUN 16  6 - 23 mg/dL   Creatinine, Ser 1.00  0.50 - 1.35 mg/dL   Calcium 9.7  8.4 - 10.5 mg/dL   Total Protein 7.8  6.0 - 8.3 g/dL   Albumin 3.8  3.5 - 5.2 g/dL   AST 22  0 - 37 U/L   ALT 25  0 - 53 U/L   Alkaline Phosphatase 51  39 - 117 U/L   Total Bilirubin 0.3   0.3 - 1.2 mg/dL   GFR calc non Af Amer 83 (*) >90 mL/min   GFR calc Af Amer >90  >90 mL/min   Comment: (NOTE)     The eGFR has been calculated using the CKD EPI equation.     This calculation has not been validated in all clinical situations.     eGFR's persistently <90 mL/min signify possible Chronic Kidney     Disease.  TROPONIN I     Status: None   Collection Time    09/11/13  7:37 PM      Result Value Range   Troponin I <0.30  <0.30 ng/mL   Comment:            Due to the release kinetics of cTnI,     a negative result within the first hours     of the onset of symptoms does not rule out     myocardial infarction with certainty.     If myocardial infarction is still suspected,     repeat the test at appropriate intervals.  D-DIMER, QUANTITATIVE     Status: None   Collection Time    09/11/13  7:38 PM      Result Value Range   D-Dimer, Quant 0.35  0.00 - 0.48 ug/mL-FEU   Comment:            AT THE INHOUSE ESTABLISHED CUTOFF     VALUE OF 0.48 ug/mL FEU,     THIS ASSAY HAS BEEN DOCUMENTED     IN THE LITERATURE TO HAVE     A SENSITIVITY AND NEGATIVE     PREDICTIVE VALUE OF AT LEAST     98 TO 99%.  THE  TEST RESULT     SHOULD BE CORRELATED WITH     AN ASSESSMENT OF THE CLINICAL     PROBABILITY OF DVT / VTE.    Radiological Exams on Admission: Dg Chest 2 View  09/11/2013   CLINICAL DATA:  Weakness.  EXAM: CHEST  2 VIEW  COMPARISON:  08/23/2013  FINDINGS: Two views of the chest demonstrate prominent lung markings that appear chronic. No evidence for overt pulmonary edema or airspace disease. Heart size is within normal limits and stable. Degenerative changes in thoracic spine. No evidence for pleural effusions.  IMPRESSION: Chronic lung changes without acute findings.   Electronically Signed   By: Markus Daft M.D.   On: 09/11/2013 20:11    Assessment/Plan Principal Problem:   Chest pain Active Problems:   ANXIETY DISORDER, GENERALIZED   HYPERTENSION, BENIGN SYSTEMIC    CORONARY ARTERY DISEASE   COPD   BACK PAIN, LOW   Syncope  Chest pain Patient has significant family history of coronary artery disease, he himself has CAD with stent. Will admit the patient and obtain serial cardiac enzymes, will get cardiology consultation in the morning for possible nuclear perfusion stress test. We'll keep him n.p.o.  Syncope Will monitor the patient in telemetry, monitor the cardiac enzymes. Last echo was in 2007, will obtain 2-D echo in the morning.  Chronic back pain Continue Percocet when necessary for pain  Anxiety Continue Xanax when necessary for anxiety  Coronary artery disease Continue metoprolol, aspirin 81 mg by mouth daily, Imdur 30 mg tablet daily, patient does not want nitroglycerin paste.  DVT prophylaxis Lovenox  Code status: Patient is full code  Family discussion: No family at bedside, discussed with patient in detail   Time Spent on Admission: 75 minutes  Kaeleen Odom S [facility] Pager: [phone] 09/11/2013, 10:07 PM  If 7PM-7AM, please contact night-coverage  [email or web address]  Password [email or web address]

## 2013-09-11 NOTE — ED Notes (Signed)
Pt was visiting friend at nursing home, pt passed out, denies injurying, SOB, diaphoretic, BP 129/110 HR 107, call pcp was told to come to ED.

## 2013-09-11 NOTE — ED Provider Notes (Signed)
CSN: [id number]     Arrival date & time 09/11/13  1816 History   First MD Initiated Contact with Patient 09/11/13 1830     Chief Complaint  Patient presents with  . Chest Pain   (Consider location/radiation/quality/duration/timing/severity/associated sxs/prior Treatment) Patient is a 58 y.o. male presenting with chest pain. The history is provided by the patient (the pt had chest pain and syncope today).  Chest Pain Pain location:  Substernal area Pain quality: aching   Pain radiates to:  Does not radiate Pain radiates to the back: no   Pain severity:  Moderate Timing:  Intermittent Progression:  Waxing and waning Chronicity:  New Associated symptoms: no abdominal pain, no back pain, no cough, no fatigue and no headache     Past Medical History  Diagnosis Date  . COPD (chronic obstructive pulmonary disease)   . Hypertension   . Chronic back pain   . Anxiety disorder   . Hyperlipidemia   . Depression   . Obesity   . History of alcohol abuse   . History of cocaine abuse   . Pneumonia   . CHF (congestive heart failure)   . GERD (gastroesophageal reflux disease)   . Headache(784.0)   . Arthritis    Past Surgical History  Procedure Laterality Date  . 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   . Hip surgery      rt  . Cholecystectomy    . Back surgery    . Nasal/sinus endoscopy    . Total hip arthroplasty  05/08/2012    Procedure: TOTAL HIP ARTHROPLASTY;  Surgeon: Nestor Lewandowsky, MD;  Location: [facility];  Service: Orthopedics;  Laterality: Left;  . Joint replacement     Family History  Problem Relation Age of Onset  . Coronary artery disease      family h/o  . Heart attack      family h/o   History  Substance Use Topics  . Smoking status: Current Every Day Smoker -- 0.50 packs/day for 39 years    Types: Cigarettes  . Smokeless tobacco: Not on  file  . Alcohol Use: No     Comment: denies    Review of Systems  Constitutional: Negative for appetite change and fatigue.  HENT: Negative for congestion, ear discharge and sinus pressure.   Eyes: Negative for discharge.  Respiratory: Negative for cough.   Cardiovascular: Positive for chest pain.  Gastrointestinal: Negative for abdominal pain and diarrhea.  Genitourinary: Negative for frequency and hematuria.  Musculoskeletal: Negative for back pain.  Skin: Negative for rash.  Neurological: Negative for seizures and headaches.  Psychiatric/Behavioral: Negative for hallucinations.    Allergies  Ativan; Dilaudid; and Haloperidol decanoate  Home Medications   Current Outpatient Rx  Name  Route  Sig  Dispense  Refill  . ALPRAZolam (XANAX) 1 MG tablet   Oral   Take 1 tablet (1 mg total) by mouth 3 (three) times daily as needed for anxiety. For anxiety   30 tablet   0   . amLODipine (NORVASC) 5 MG tablet   Oral   Take 5 mg by mouth daily.           Marland Kitchen aspirin 81 MG tablet   Oral   Take 1 tablet (81 mg total) by mouth daily.   30 tablet   0   . budesonide-formoterol (SYMBICORT) 160-4.5 MCG/ACT inhaler  Inhalation   Inhale 2 puffs into the lungs 2 (two) times daily.         . citalopram (CELEXA) 40 MG tablet   Oral   Take 40 mg by mouth daily.           . furosemide (LASIX) 20 MG tablet   Oral   Take 1 tablet by mouth daily.         . hydrochlorothiazide (HYDRODIURIL) 25 MG tablet   Oral   Take 1 tablet by mouth daily.         . hydrOXYzine (VISTARIL) 25 MG capsule   Oral   Take 25 mg by mouth at bedtime. For itching         . ipratropium-albuterol (DUONEB) 0.5-2.5 (3) MG/3ML SOLN   Nebulization   Take 3 mLs by nebulization 2 (two) times daily.         . isosorbide mononitrate (IMDUR) 30 MG 24 hr tablet   Oral   Take 30 mg by mouth daily.           . meloxicam (MOBIC) 15 MG tablet   Oral   Take 1 tablet by mouth daily.         .  metoprolol tartrate (LOPRESSOR) 25 MG tablet   Oral   Take 25 mg by mouth daily.          . nitroGLYCERIN (NITROSTAT) 0.4 MG SL tablet   Sublingual   Place 0.4 mg under the tongue every 5 (five) minutes as needed. For chest pain         . omeprazole (PRILOSEC) 20 MG capsule   Oral   Take 20 mg by mouth daily.         Marland Kitchen. oxyCODONE-acetaminophen (PERCOCET) 10-325 MG per tablet   Oral   Take 1 tablet by mouth every 8 (eight) hours as needed for pain.         . potassium chloride SA (K-DUR,KLOR-CON) 20 MEQ tablet   Oral   Take 1 tablet by mouth daily.         . traZODone (DESYREL) 100 MG tablet   Oral   Take 100 mg by mouth at bedtime as needed for sleep.          . vitamin C (ASCORBIC ACID) 500 MG tablet   Oral   Take 500 mg by mouth daily.          BP 109/74  Pulse 83  Temp(Src) 97.6 F (36.4 C) (Oral)  Resp 17  Ht 6\' 3"  (1.905 m)  Wt 348 lb (157.852 kg)  BMI 43.50 kg/m2  SpO2 100% Physical Exam  Constitutional: He is oriented to person, place, and time. He appears well-developed.  HENT:  Head: Normocephalic.  Eyes: Conjunctivae and EOM are normal. No scleral icterus.  Neck: Neck supple. No thyromegaly present.  Cardiovascular: Normal rate and regular rhythm.  Exam reveals no gallop and no friction rub.   No murmur heard. Pulmonary/Chest: No stridor. He has no wheezes. He has no rales. He exhibits no tenderness.  Abdominal: He exhibits no distension. There is no tenderness. There is no rebound.  Musculoskeletal: Normal range of motion. He exhibits no edema.  Lymphadenopathy:    He has no cervical adenopathy.  Neurological: He is oriented to person, place, and time. He exhibits normal muscle tone. Coordination normal.  Skin: No rash noted. No erythema.  Psychiatric: He has a normal mood and affect. His behavior is normal.    ED Course  Procedures (including critical care time) Labs Review Labs Reviewed  CBC WITH DIFFERENTIAL - Abnormal; Notable  for the following:    WBC 11.3 (*)    Monocytes Absolute 1.1 (*)    Eosinophils Relative 6 (*)    All other components within normal limits  COMPREHENSIVE METABOLIC PANEL - Abnormal; Notable for the following:    Potassium 3.6 (*)    Glucose, Bld 125 (*)    GFR calc non Af Amer 83 (*)    All other components within normal limits  TROPONIN I  D-DIMER, QUANTITATIVE   Imaging Review Dg Chest 2 View  09/11/2013   CLINICAL DATA:  Weakness.  EXAM: CHEST  2 VIEW  COMPARISON:  08/23/2013  FINDINGS: Two views of the chest demonstrate prominent lung markings that appear chronic. No evidence for overt pulmonary edema or airspace disease. Heart size is within normal limits and stable. Degenerative changes in thoracic spine. No evidence for pleural effusions.  IMPRESSION: Chronic lung changes without acute findings.   Electronically Signed   By: Richarda Overlie M.D.   On: 09/11/2013 20:11    EKG Interpretation   None      Date: 09/11/2013  Rate: 88  Rhythm: normal sinus rhythm  QRS Axis: normal  Intervals: normal  ST/T Wave abnormalities: normal  Conduction Disutrbances:none  Narrative Interpretation:   Old EKG Reviewed: none available    MDM   1. Syncope       Benny Lennert, MD 09/11/13 2120

## 2013-09-11 NOTE — ED Notes (Signed)
Chest pain

## 2013-09-11 NOTE — ED Notes (Signed)
Patient's B/P dropped to 80/64. Hospitalist at bedside. Ordered to remove nitropaste and not to reapply.  Removed nitropaste from chest area and B/P increased to 126/66.

## 2013-09-12 ENCOUNTER — Encounter (HOSPITAL_COMMUNITY)
Admission: EM | Disposition: A | Discharge status: Home / Self Care | Payer: Self-pay | Source: Home / Self Care | Attending: Cardiovascular Disease

## 2013-09-12 ENCOUNTER — Encounter (HOSPITAL_COMMUNITY): Payer: Self-pay | Admitting: Cardiology

## 2013-09-12 DIAGNOSIS — R079 Chest pain, unspecified: Secondary | ICD-10-CM | POA: Diagnosis present

## 2013-09-12 DIAGNOSIS — I2510 Atherosclerotic heart disease of native coronary artery without angina pectoris: Secondary | ICD-10-CM

## 2013-09-12 DIAGNOSIS — I200 Unstable angina: Secondary | ICD-10-CM

## 2013-09-12 HISTORY — PX: LEFT HEART CATHETERIZATION WITH CORONARY ANGIOGRAM: SHX5451

## 2013-09-12 LAB — COMPREHENSIVE METABOLIC PANEL
ALT: 21 U/L (ref 0–53)
AST: 16 U/L (ref 0–37)
Albumin: 3.4 g/dL — ABNORMAL LOW (ref 3.5–5.2)
Alkaline Phosphatase: 48 U/L (ref 39–117)
BUN: 14 mg/dL (ref 6–23)
CHLORIDE: 102 meq/L (ref 96–112)
CO2: 27 meq/L (ref 19–32)
Calcium: 9.2 mg/dL (ref 8.4–10.5)
Creatinine, Ser: 0.95 mg/dL (ref 0.50–1.35)
GFR calc Af Amer: >90 mL/min (ref >90)
Glucose, Bld: 113 mg/dL — ABNORMAL HIGH (ref 70–99)
Potassium: 3.6 mEq/L — ABNORMAL LOW (ref 3.7–5.3)
SODIUM: 141 meq/L (ref 137–147)
Total Bilirubin: 0.4 mg/dL (ref 0.3–1.2)
Total Protein: 7.1 g/dL (ref 6.0–8.3)

## 2013-09-12 LAB — CBC
HCT: 39.9 % (ref 39.0–52.0)
HEMOGLOBIN: 13.6 g/dL (ref 13.0–17.0)
MCH: 32.5 pg (ref 26.0–34.0)
MCHC: 34.1 g/dL (ref 30.0–36.0)
MCV: 95.2 fL (ref 78.0–100.0)
Platelets: 219 10*3/uL (ref 150–400)
RBC: 4.19 MIL/uL — AB (ref 4.22–5.81)
RDW: 12.4 % (ref 11.5–15.5)
WBC: 9.4 10*3/uL (ref 4.0–10.5)

## 2013-09-12 LAB — TROPONIN I
Troponin I: <0.3 ng/mL (ref <0.30)
Troponin I: <0.3 ng/mL (ref <0.30)

## 2013-09-12 LAB — PROTIME-INR
INR: 1.03 (ref 0.00–1.49)
PROTHROMBIN TIME: 13.3 s (ref 11.6–15.2)

## 2013-09-12 SURGERY — LEFT HEART CATHETERIZATION WITH CORONARY ANGIOGRAM
Anesthesia: LOCAL

## 2013-09-12 MED ORDER — HEPARIN SODIUM (PORCINE) 1000 UNIT/ML IJ SOLN
INTRAMUSCULAR | Status: AC
Start: 2013-09-12 — End: 2013-09-12
  Filled 2013-09-12: qty 1

## 2013-09-12 MED ORDER — VERAPAMIL HCL 2.5 MG/ML IV SOLN
INTRAVENOUS | Status: AC
  Filled 2013-09-12: qty 2

## 2013-09-12 MED ORDER — HEPARIN (PORCINE) IN NACL 2-0.9 UNIT/ML-% IJ SOLN
INTRAMUSCULAR | Status: AC
  Filled 2013-09-12: qty 1000

## 2013-09-12 MED ORDER — SODIUM CHLORIDE 0.9 % IJ SOLN: 3.0000 mL | INTRAMUSCULAR | Status: DC | PRN

## 2013-09-12 MED ORDER — ACTIVE PARTNERSHIP FOR HEALTH OF YOUR HEART BOOK
Freq: Once | Status: AC
  Administered 2013-09-12: 22:00:00
  Filled 2013-09-12: qty 1

## 2013-09-12 MED ORDER — ACETAMINOPHEN 325 MG PO TABS
650.0000 mg | ORAL_TABLET | ORAL | Status: DC | PRN
Start: 2013-09-12 — End: 2013-09-14

## 2013-09-12 MED ORDER — SODIUM CHLORIDE 0.9 % IV SOLN: 250.0000 mL | INTRAVENOUS | Status: DC | PRN

## 2013-09-12 MED ORDER — NITROGLYCERIN 0.2 MG/ML ON CALL CATH LAB
INTRAVENOUS | Status: AC
  Filled 2013-09-12: qty 1

## 2013-09-12 MED ORDER — ASPIRIN 81 MG PO CHEW: 81.0000 mg | CHEWABLE_TABLET | ORAL | Status: DC

## 2013-09-12 MED ORDER — MIDAZOLAM HCL 2 MG/2ML IJ SOLN
INTRAMUSCULAR | Status: AC
  Filled 2013-09-12: qty 2

## 2013-09-12 MED ORDER — SODIUM CHLORIDE 0.9 % IV SOLN: 1.0000 mL/kg/h | INTRAVENOUS | Status: DC

## 2013-09-12 MED ORDER — SODIUM CHLORIDE 0.9 % IJ SOLN: 3.0000 mL | Freq: Two times a day (BID) | INTRAMUSCULAR | Status: DC

## 2013-09-12 MED ORDER — MORPHINE SULFATE 2 MG/ML IJ SOLN
2.0000 mg | INTRAMUSCULAR | Status: DC | PRN
  Administered 2013-09-12 – 2013-09-13 (×5): 2 mg via INTRAVENOUS
  Filled 2013-09-12 (×5): qty 1

## 2013-09-12 MED ORDER — LIDOCAINE HCL (PF) 1 % IJ SOLN
INTRAMUSCULAR | Status: AC
  Filled 2013-09-12: qty 30

## 2013-09-12 MED ORDER — SODIUM CHLORIDE 0.9 % IV SOLN: INTRAVENOUS | Status: AC

## 2013-09-12 MED ORDER — FENTANYL CITRATE 0.05 MG/ML IJ SOLN
INTRAMUSCULAR | Status: AC
Start: 2013-09-12 — End: 2013-09-12
  Filled 2013-09-12: qty 2

## 2013-09-12 NOTE — CV Procedure (Signed)
      Cardiac Catheterization Operative Report  [name] [id number] 1/21/20153:50 PM [name],[name], MD  Procedure Performed:  1. Left Heart Catheterization 2. Selective Coronary Angiography 3. Left ventricular angiogram  Operator: Verne Carrow, MD  Arterial access site:  Right radial artery.   Indication: 58 yo male with history of COPD, tobacco abuse, HTN, HLD, GERD, prior cocaine abuse admitted to [facility] with chest pain. Cardiac markers negative. D-dimer within normal limits. Transferred to [facility], [place] for cardiac cath.                                  Procedure Details: The risks, benefits, complications, treatment options, and expected outcomes were discussed with the patient. The patient and/or family concurred with the proposed plan, giving informed consent. The patient was brought to the cath lab after IV hydration was begun and oral premedication was given. The patient was further sedated with Versed and Fentanyl. The right wrist was assessed with an Allens test which was positive. The right wrist was prepped and draped in a sterile fashion. 1% lidocaine was used for local anesthesia. Using the modified Seldinger access technique, a 5 French sheath was placed in the right radial artery. 3 mg Verapamil was given through the sheath. 5000 units IV heparin was given. 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 A pigtail catheter was used to perform a left ventricular angiogram. The sheath was removed from the right radial artery and a Terumo hemostasis band was applied at the arteriotomy site on the right wrist.   There were no immediate complications. The patient was taken to the recovery area in stable condition.   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  Left Anterior Descending Artery: Large caliber vessel that does not reach the apex. There is 30% ostial/proximal stenosis. The mid vessel has a 30% stenosis.   Circumflex Artery: Moderate caliber vessel with three small obtuse marginal branches. There is diffuse 30% proximal stenosis, 50% mid stenosis at the takeoff of the third OM branch with 50% ostial stenosis in the small caliber third OM branch.  Moderate caliber ramus intermediate branch with proximal 30% stenosis.   Right Coronary Artery: Large dominant vessel with 40% ostial stenosis, mild mid vessel irregularity. The posterolateral branch is a large caliber vessel. Distally, the posterolateral branch bifurcates and becomes small in caliber. In the distal branch, there is a 70% stenosis (1.75 mm vessel). The PDA has mild plaque disease.   Left Ventricular Angiogram: LVEF=50-55%.   Impression: 1. Mild to moderate non-obstructive CAD 2. Preserved LV systolic function 3. Non-cardiac chest pain  Recommendations: Continue medical management of CAD. Tobacco cessation. Resume PPI. 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        Complications:  None. The patient tolerated the procedure well.

## 2013-09-12 NOTE — Interval H&P Note (Signed)
History and Physical Interval Note:  09/12/2013 3:02 PM  [name]  has presented today for cardiac cath with the diagnosis of Chest pain, known CAD.  The various methods of treatment have been discussed with the patient and family. After consideration of risks, benefits and other options for treatment, the patient has consented to  Procedure(s): LEFT HEART CATHETERIZATION WITH CORONARY ANGIOGRAM (N/A) as a surgical intervention .  The patient's history has been reviewed, patient examined, no change in status, stable for surgery.  I have reviewed the patient's chart and labs.  Questions were answered to the patient's satisfaction.    Cath Lab Visit (complete for each Cath Lab visit)  Clinical Evaluation Leading to the Procedure:   ACS: no  Non-ACS:    Anginal Classification: CCS III  Anti-ischemic medical therapy: Maximal Therapy (2 or more classes of medications)  Non-Invasive Test Results: No non-invasive testing performed  Prior CABG: No previous CABG        MCALHANY,CHRISTOPHER

## 2013-09-12 NOTE — Progress Notes (Signed)
Patient drunk the rest of his coca cola around 0130 and asked for two ice creams. I reminded him that he was to not eat or drink anything after midnight due to seeing the cardiologist today and possibly having some tests done or a stress test. Patient stated the MD only told him he may go to [facility] and have some dye administered but nothing about surgery so he thought he could still eat and drink. I reminded him we discussed it prior and I even wrote it on the board and for him not to eat or drink anything else except few ice chips throughout the night. Helmut Muster RN

## 2013-09-12 NOTE — H&P (View-Only) (Signed)
Consulting cardiologist: Dr. Jonelle SidleSamuel G. Zalaya Astarita  Clinical Summary [name] is a 58 y.o.male with history outlined below, currently admitted after an episode of syncope preceded by diffuse diaphoresis, and followed by anginal chest pain. His cardiac history is not at all well-defined. He tells me that he had a cardiac catheterization in [facility] at [facility] and "may have had a stent" placed at that time. 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 He states that over the last 3 weeks he has been progressively more short of breath with activity, describes NYHA class III symptoms, also recurring mild exertional chest discomfort. He denies having any cardiac followup or ischemic testing in the last several years.  ECG reviewed and normal. Cardiac markers have been normal as well. Telemetry was reviewed showing episodes of brief pauses and decreased voltage QRS in sinus rhythm which the monitor is incorrectly labeling as asystole. He has no definite history of segment bradycardia or other arrhythmias.   Allergies  Allergen Reactions  . Ativan [Lorazepam] Other (See Comments)    "out of mind"  . Dilaudid [Hydromorphone Hcl] Other (See Comments)    " out of mind"  . Haloperidol Decanoate     Medications Scheduled Medications: . amLODipine  5 mg Oral Daily  . aspirin  81 mg Oral Daily  . budesonide-formoterol  2 puff Inhalation BID  . citalopram  40 mg Oral Daily  . enoxaparin (LOVENOX) injection  70 mg Subcutaneous Q24H  . furosemide  20 mg Oral Daily  . hydrochlorothiazide  25 mg Oral Daily  . hydrOXYzine  25 mg Oral QHS  . isosorbide mononitrate  30 mg Oral Daily  . meloxicam  15 mg Oral Daily  . metoprolol tartrate  25 mg Oral Daily  . pantoprazole  40 mg Oral Daily  . potassium chloride SA  20 mEq Oral Daily  . sodium chloride  3 mL Intravenous Q12H     PRN Medications: sodium  chloride, ALPRAZolam, morphine injection, nitroGLYCERIN, ondansetron (ZOFRAN) IV, ondansetron, oxyCODONE, oxyCODONE-acetaminophen, sodium chloride, traZODone   Past Medical History  Diagnosis Date  . COPD (chronic obstructive pulmonary disease)   . Essential hypertension, benign   . Chronic back pain   . Anxiety disorder   . Hyperlipidemia   . Depression   . Obesity   . History of alcohol abuse   . History of cocaine abuse   . Pneumonia   . GERD (gastroesophageal reflux disease)   . Headache(784.0)   . Arthritis   . 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 Hip surgery      rt  . Cholecystectomy    . Back surgery    . Nasal/sinus endoscopy    . Total hip arthroplasty  05/08/2012    Procedure: TOTAL HIP ARTHROPLASTY;  Surgeon: Nestor LewandowskyFrank J Rowan, MD;  Location: [facility] OR;  Service: Orthopedics;  Laterality: Left;  . Joint replacement      Family History  Problem Relation Age of Onset  . Coronary artery disease      family h/o  . Heart attack      family h/o    Social History [name] reports that he has been  smoking Cigarettes.  He has a 19.5 pack-year smoking history. He does not have any smokeless tobacco history on file. Mr. Howes reports that he does not drink alcohol.  Review of Systems No palpitations at baseline. Recent symptoms outlined above. No reported bleeding problems. Has chronic hip pain with previous hip replacements. No recent orthopnea. Otherwise negative.  Physical Examination Blood pressure 118/68, pulse 76, temperature 98.1 F (36.7 C), temperature source Oral, resp. rate 20, height 6\' 3"  (1.905 m), weight 344 lb 9.3 oz (156.3 kg), SpO2 98.00%.  Intake/Output Summary (Last 24 hours) at 09/12/13 1100 Last data filed at 09/12/13 0645  Gross per 24 hour  Intake       0 ml  Output   2200 ml  Net  -2200 ml    Morbidly obese male, no distress. HEENT: Conjunctiva and lids normal, oropharynx clear. Neck: Supple, increased girth, difficult to assess JVP, no thyromegaly. Lungs: Clear to auscultation, decreased breath sounds, nonlabored breathing at rest. Cardiac: Regular rate and rhythm, no S3 or significant systolic murmur, no pericardial rub. Abdomen: Soft, nontender, obese, bowel sounds present. Extremities: Chronic appearing mild edema, distal pulses 2+. Skin: Warm and dry. Multiple tattoos. Musculoskeletal: No kyphosis. Neuropsychiatric: Alert and oriented x3, affect grossly appropriate.   Lab Results  Basic Metabolic Panel:  Recent Labs Lab 09/11/13 1937 09/12/13 0516  NA 141 141  K 3.6* 3.6*  CL 101 102  CO2 25 27  GLUCOSE 125* 113*  BUN 16 14  CREATININE 1.00 0.95  CALCIUM 9.7 9.2    Liver Function Tests:  Recent Labs Lab 09/11/13 1937 09/12/13 0516  AST 22 16  ALT 25 21  ALKPHOS 51 48  BILITOT 0.3 0.4  PROT 7.8 7.1  ALBUMIN 3.8 3.4*    CBC:  Recent Labs Lab 09/11/13 1937 09/12/13 0516  WBC 11.3* 9.4  NEUTROABS 5.3  --   HGB 14.7 13.6  HCT 42.4 39.9  MCV 94.9 95.2  PLT 258 219    Cardiac Enzymes:  Recent Labs Lab 09/11/13 1937 09/11/13 2329 09/12/13 0516  TROPONINI <0.30 <0.30 <0.30    ECG Normal sinus rhythm, no acute ST segment changes.   Imaging CHEST 2 VIEW  COMPARISON: 08/23/2013  FINDINGS: Two views of the chest demonstrate prominent lung markings that appear chronic. No evidence for overt pulmonary edema or airspace disease. Heart size is within normal limits and stable. Degenerative changes in thoracic spine. No evidence for pleural effusions.  IMPRESSION: Chronic lung changes without acute findings.   Impression  1. Progressive dyspnea on exertion and recurring chest pain concerning for accelerating angina. Cardiac history is difficult to completely define based on limited  information as outlined above. Cardiac markers are normal this point in ECG shows no acute ST segment changes. He does have a reported history of present her CAD in the family, also personal history of hypertension, prior substance abuse, and ongoing tobacco use.  2. Episode of syncope preceded by diaphoresis, and followed by chest pain. Ischemia is certainly to be considered as noted above. Also follow heart rhythm closely to exclude symptomatic bradycardia or pauses.  3. History of hypertension, current blood pressures stable.  4. Uncertain lipid status. Not on statin at this time.  5. Tobacco abuse.  6. Morbid obesity.  7. History of COPD, would also question OSA.   Recommendations  Reviewed available information and discuss the situation with the patient. In light of his reported symptoms and history, we discussed transfer to [facility] for a  diagnostic cardiac catheterization to best define his coronary anatomy and determine if any revascularization strategies need to be considered. We reviewed the risks and benefits, and he is in agreement to proceed. Currently on aspirin, Norvasc, Lovenox, Lasix, Imdur, Lopressor. Will add empiric statin. Check FLP. Further plans to follow after catheterization.   Jonelle Sidle, M.D., F.A.C.C.

## 2013-09-12 NOTE — Consult Note (Addendum)
Consulting cardiologist: Dr. Jonelle SidleSamuel G. McDowell  Clinical Summary [name] is a 58 y.o.male with history outlined below, currently admitted after an episode of syncope preceded by diffuse diaphoresis, and followed by anginal chest pain. His cardiac history is not at all well-defined. He tells me that he had a cardiac catheterization in 2000 at [facility] and "may have had a stent" placed at that time. 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 He states that over the last 3 weeks he has been progressively more short of breath with activity, describes NYHA class III symptoms, also recurring mild exertional chest discomfort. He denies having any cardiac followup or ischemic testing in the last several years.  ECG reviewed and normal. Cardiac markers have been normal as well. Telemetry was reviewed showing episodes of brief pauses and decreased voltage QRS in sinus rhythm which the monitor is incorrectly labeling as asystole. He has no definite history of segment bradycardia or other arrhythmias.   Allergies  Allergen Reactions  . Ativan [Lorazepam] Other (See Comments)    "out of mind"  . Dilaudid [Hydromorphone Hcl] Other (See Comments)    " out of mind"  . Haloperidol Decanoate     Medications Scheduled Medications: . amLODipine  5 mg Oral Daily  . aspirin  81 mg Oral Daily  . budesonide-formoterol  2 puff Inhalation BID  . citalopram  40 mg Oral Daily  . enoxaparin (LOVENOX) injection  70 mg Subcutaneous Q24H  . furosemide  20 mg Oral Daily  . hydrochlorothiazide  25 mg Oral Daily  . hydrOXYzine  25 mg Oral QHS  . isosorbide mononitrate  30 mg Oral Daily  . meloxicam  15 mg Oral Daily  . metoprolol tartrate  25 mg Oral Daily  . pantoprazole  40 mg Oral Daily  . potassium chloride SA  20 mEq Oral Daily  . sodium chloride  3 mL Intravenous Q12H     PRN Medications: sodium  chloride, ALPRAZolam, morphine injection, nitroGLYCERIN, ondansetron (ZOFRAN) IV, ondansetron, oxyCODONE, oxyCODONE-acetaminophen, sodium chloride, traZODone   Past Medical History  Diagnosis Date  . COPD (chronic obstructive pulmonary disease)   . Essential hypertension, benign   . Chronic back pain   . Anxiety disorder   . Hyperlipidemia   . Depression   . Obesity   . History of alcohol abuse   . History of cocaine abuse   . Pneumonia   . GERD (gastroesophageal reflux disease)   . Headache(784.0)   . Arthritis   . 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 Hip surgery      rt  . Cholecystectomy    . Back surgery    . Nasal/sinus endoscopy    . Total hip arthroplasty  05/08/2012    Procedure: TOTAL HIP ARTHROPLASTY;  Surgeon: Nestor LewandowskyFrank J Rowan, MD;  Location: [facility] OR;  Service: Orthopedics;  Laterality: Left;  . Joint replacement      Family History  Problem Relation Age of Onset  . Coronary artery disease      family h/o  . Heart attack      family h/o    Social History [name] reports that he has been  smoking Cigarettes.  He has a 19.5 pack-year smoking history. He does not have any smokeless tobacco history on file. Mr. Howes reports that he does not drink alcohol.  Review of Systems No palpitations at baseline. Recent symptoms outlined above. No reported bleeding problems. Has chronic hip pain with previous hip replacements. No recent orthopnea. Otherwise negative.  Physical Examination Blood pressure 118/68, pulse 76, temperature 98.1 F (36.7 C), temperature source Oral, resp. rate 20, height 6\' 3"  (1.905 m), weight 344 lb 9.3 oz (156.3 kg), SpO2 98.00%.  Intake/Output Summary (Last 24 hours) at 09/12/13 1100 Last data filed at 09/12/13 0645  Gross per 24 hour  Intake       0 ml  Output   2200 ml  Net  -2200 ml    Morbidly obese male, no distress. HEENT: Conjunctiva and lids normal, oropharynx clear. Neck: Supple, increased girth, difficult to assess JVP, no thyromegaly. Lungs: Clear to auscultation, decreased breath sounds, nonlabored breathing at rest. Cardiac: Regular rate and rhythm, no S3 or significant systolic murmur, no pericardial rub. Abdomen: Soft, nontender, obese, bowel sounds present. Extremities: Chronic appearing mild edema, distal pulses 2+. Skin: Warm and dry. Multiple tattoos. Musculoskeletal: No kyphosis. Neuropsychiatric: Alert and oriented x3, affect grossly appropriate.   Lab Results  Basic Metabolic Panel:  Recent Labs Lab 09/11/13 1937 09/12/13 0516  NA 141 141  K 3.6* 3.6*  CL 101 102  CO2 25 27  GLUCOSE 125* 113*  BUN 16 14  CREATININE 1.00 0.95  CALCIUM 9.7 9.2    Liver Function Tests:  Recent Labs Lab 09/11/13 1937 09/12/13 0516  AST 22 16  ALT 25 21  ALKPHOS 51 48  BILITOT 0.3 0.4  PROT 7.8 7.1  ALBUMIN 3.8 3.4*    CBC:  Recent Labs Lab 09/11/13 1937 09/12/13 0516  WBC 11.3* 9.4  NEUTROABS 5.3  --   HGB 14.7 13.6  HCT 42.4 39.9  MCV 94.9 95.2  PLT 258 219    Cardiac Enzymes:  Recent Labs Lab 09/11/13 1937 09/11/13 2329 09/12/13 0516  TROPONINI <0.30 <0.30 <0.30    ECG Normal sinus rhythm, no acute ST segment changes.   Imaging CHEST 2 VIEW  COMPARISON: 08/23/2013  FINDINGS: Two views of the chest demonstrate prominent lung markings that appear chronic. No evidence for overt pulmonary edema or airspace disease. Heart size is within normal limits and stable. Degenerative changes in thoracic spine. No evidence for pleural effusions.  IMPRESSION: Chronic lung changes without acute findings.   Impression  1. Progressive dyspnea on exertion and recurring chest pain concerning for accelerating angina. Cardiac history is difficult to completely define based on limited  information as outlined above. Cardiac markers are normal this point in ECG shows no acute ST segment changes. He does have a reported history of present her CAD in the family, also personal history of hypertension, prior substance abuse, and ongoing tobacco use.  2. Episode of syncope preceded by diaphoresis, and followed by chest pain. Ischemia is certainly to be considered as noted above. Also follow heart rhythm closely to exclude symptomatic bradycardia or pauses.  3. History of hypertension, current blood pressures stable.  4. Uncertain lipid status. Not on statin at this time.  5. Tobacco abuse.  6. Morbid obesity.  7. History of COPD, would also question OSA.   Recommendations  Reviewed available information and discuss the situation with the patient. In light of his reported symptoms and history, we discussed transfer to [facility] for a  diagnostic cardiac catheterization to best define his coronary anatomy and determine if any revascularization strategies need to be considered. We reviewed the risks and benefits, and he is in agreement to proceed. Currently on aspirin, Norvasc, Lovenox, Lasix, Imdur, Lopressor. Will add empiric statin. Check FLP. Further plans to follow after catheterization.   Jonelle Sidle, M.D., F.A.C.C.

## 2013-09-12 NOTE — Progress Notes (Signed)
Report called to [place] on 3W at [facility].

## 2013-09-12 NOTE — Progress Notes (Signed)
UR chart review completed.  

## 2013-09-12 NOTE — Care Management Note (Signed)
    Page 1 of 1   09/12/2013     11:22:25 AM   CARE MANAGEMENT NOTE 09/12/2013  Patient:  [name], [name]   Account Number:  [id number]  Date Initiated:  09/12/2013  Documentation initiated by:  Sharrie Rothman  Subjective/Objective Assessment:   Pt admitted from home with CP. Pt lives with his wife and will return home at discharge. pPt has cane for home use.     Action/Plan:   Pt to transfer to [facility] for cardiology services.   Anticipated DC Date:  09/12/2013   Anticipated DC Plan:  ACUTE TO ACUTE TRANS      DC Planning Services  CM consult      Choice offered to / List presented to:             Status of service:  Completed, signed off Medicare Important Message given?   (If response is "NO", the following Medicare IM given date fields will be blank) Date Medicare IM given:   Date Additional Medicare IM given:    Discharge Disposition:  ACUTE TO ACUTE TRANS  Per UR Regulation:    If discussed at Long Length of Stay Meetings, dates discussed:    Comments:  09/12/13 1120 Arlyss Queen, RN BSN CM

## 2013-09-12 NOTE — Progress Notes (Signed)
Patient seen by cardiology and is being transferred to [facility] for Cardiac Catheterization and will be on cardiology service as primary.  TRH will sign off.  Appreciate cardiology for taking over.   Kathlen Mody, MD

## 2013-09-13 ENCOUNTER — Encounter (HOSPITAL_COMMUNITY): Payer: Self-pay | Admitting: Nurse Practitioner

## 2013-09-13 DIAGNOSIS — I517 Cardiomegaly: Secondary | ICD-10-CM

## 2013-09-13 DIAGNOSIS — I25118 Atherosclerotic heart disease of native coronary artery with other forms of angina pectoris: Secondary | ICD-10-CM | POA: Diagnosis present

## 2013-09-13 DIAGNOSIS — I2510 Atherosclerotic heart disease of native coronary artery without angina pectoris: Secondary | ICD-10-CM | POA: Diagnosis present

## 2013-09-13 LAB — BASIC METABOLIC PANEL
BUN: 13 mg/dL (ref 6–23)
CHLORIDE: 98 meq/L (ref 96–112)
CO2: 25 meq/L (ref 19–32)
Calcium: 9.2 mg/dL (ref 8.4–10.5)
Creatinine, Ser: 0.9 mg/dL (ref 0.50–1.35)
GFR calc Af Amer: >90 mL/min (ref >90)
GFR calc non Af Amer: >90 mL/min (ref >90)
Glucose, Bld: 150 mg/dL — ABNORMAL HIGH (ref 70–99)
Potassium: 3.7 mEq/L (ref 3.7–5.3)
Sodium: 137 mEq/L (ref 137–147)

## 2013-09-13 LAB — HEMOGLOBIN A1C
Hgb A1c MFr Bld: 5.9 % — ABNORMAL HIGH (ref <5.7)
Mean Plasma Glucose: 123 mg/dL — ABNORMAL HIGH (ref <117)

## 2013-09-13 LAB — TSH: TSH: 2.231 u[IU]/mL (ref 0.350–4.500)

## 2013-09-13 MED ORDER — PERFLUTREN LIPID MICROSPHERE
1.0000 mL | INTRAVENOUS | Status: AC | PRN
  Administered 2013-09-13: 17:00:00 2 mL via INTRAVENOUS
  Filled 2013-09-13: qty 10

## 2013-09-13 MED ORDER — ATORVASTATIN CALCIUM 10 MG PO TABS
10.0000 mg | ORAL_TABLET | Freq: Every day | ORAL | Status: DC
  Administered 2013-09-13 – 2013-09-14 (×2): 10 mg via ORAL
  Filled 2013-09-13 (×2): qty 1

## 2013-09-13 MED ORDER — OXYCODONE-ACETAMINOPHEN 5-325 MG PO TABS
1.0000 | ORAL_TABLET | ORAL | Status: DC | PRN
  Administered 2013-09-13 – 2013-09-14 (×4): 1 via ORAL
  Filled 2013-09-13 (×3): qty 1

## 2013-09-13 MED ORDER — OXYCODONE HCL 5 MG PO TABS
5.0000 mg | ORAL_TABLET | Freq: Three times a day (TID) | ORAL | Status: DC | PRN
  Administered 2013-09-13 – 2013-09-14 (×3): 5 mg via ORAL
  Filled 2013-09-13 (×2): qty 1

## 2013-09-13 NOTE — Progress Notes (Signed)
Patient Name: [name] Date of Encounter: 09/13/2013   Principal Problem:   Syncope Active Problems:   Accelerating angina   ANXIETY DISORDER, GENERALIZED   HYPERTENSION, BENIGN SYSTEMIC   CORONARY ARTERY DISEASE   BACK PAIN, LOW   COPD    SUBJECTIVE  No chest pain or sob.  Had a 2.83 second pause at approx 9:30 PM - asymptomatic.  He doesn't know if was sleeping at the time or not.  CURRENT MEDS . amLODipine  5 mg Oral Daily  . aspirin  81 mg Oral Daily  . budesonide-formoterol  2 puff Inhalation BID  . citalopram  40 mg Oral Daily  . furosemide  20 mg Oral Daily  . hydrochlorothiazide  25 mg Oral Daily  . hydrOXYzine  25 mg Oral QHS  . isosorbide mononitrate  30 mg Oral Daily  . meloxicam  15 mg Oral Daily  . metoprolol tartrate  25 mg Oral Daily  . pantoprazole  40 mg Oral Daily  . potassium chloride SA  20 mEq Oral Daily  . sodium chloride  3 mL Intravenous Q12H   OBJECTIVE  Filed Vitals:   09/12/13 2000 09/12/13 2341 09/13/13 0352 09/13/13 0620  BP: 113/62 111/53 98/65   Pulse: 69 74 72   Temp: 97.8 F (36.6 C) 97.9 F (36.6 C) 98.3 F (36.8 C)   TempSrc: Oral Oral Oral   Resp: 20 18 20    Height:      Weight:    328 lb 14.8 oz (149.2 kg)  SpO2: 95% 91% 95%     Intake/Output Summary (Last 24 hours) at 09/13/13 0759 Last data filed at 09/13/13 0657  Gross per 24 hour  Intake 658.75 ml  Output   2600 ml  Net -1941.25 ml   Filed Weights   09/11/13 2253 09/12/13 1100 09/13/13 0620  Weight: 344 lb 9.3 oz (156.3 kg) 350 lb (158.759 kg) 328 lb 14.8 oz (149.2 kg)    PHYSICAL EXAM  General: Pleasant, NAD. Neuro: Alert and oriented X 3. Moves all extremities spontaneously. Psych: Normal affect. HEENT:  Normal  Neck: Supple without bruits.  Neck is obese and difficult to assess for jvp. Lungs:  Resp regular and unlabored, exp wheezing and diminished breath sounds throughout. Heart: RRR no s3, s4, or murmurs. Abdomen: Soft, non-tender,  non-distended, BS + x 4.  Extremities: No clubbing, cyanosis or edema. DP/PT/Radials 2+ and equal bilaterally.  R wrist cath site w/o bleeding/bruit/hematoma.  Accessory Clinical Findings  CBC  Recent Labs  09/11/13 1937 09/12/13 0516  WBC 11.3* 9.4  NEUTROABS 5.3  --   HGB 14.7 13.6  HCT 42.4 39.9  MCV 94.9 95.2  PLT 258 219   Basic Metabolic Panel  Recent Labs  09/11/13 1937 09/12/13 0516  NA 141 141  K 3.6* 3.6*  CL 101 102  CO2 25 27  GLUCOSE 125* 113*  BUN 16 14  CREATININE 1.00 0.95  CALCIUM 9.7 9.2   Liver Function Tests  Recent Labs  09/11/13 1937 09/12/13 0516  AST 22 16  ALT 25 21  ALKPHOS 51 48  BILITOT 0.3 0.4  PROT 7.8 7.1  ALBUMIN 3.8 3.4*   Cardiac Enzymes  Recent Labs  09/11/13 2329 09/12/13 0516 09/12/13 1112  TROPONINI <0.30 <0.30 <0.30   D-Dimer  Recent Labs  09/11/13 1938  DDIMER 0.35   TELE  Sinus rhythm, intermittent sinus brady.  In past 24 hrs pauses of 2.83 seconds last PM and 3.71 secs yesterday  morning (though strip has some artifact).  Radiology/Studies  Dg Chest 2 View  09/11/2013   CLINICAL DATA:  Weakness.  EXAM: CHEST  2 VIEW  COMPARISON:  08/23/2013  FINDINGS: Two views of the chest demonstrate prominent lung markings that appear chronic. No evidence for overt pulmonary edema or airspace disease. Heart size is within normal limits and stable. Degenerative changes in thoracic spine. No evidence for pleural effusions.  IMPRESSION: Chronic lung changes without acute findings.   Electronically Signed   By: Richarda Overlie M.D.   On: 09/11/2013 20:11   ASSESSMENT AND PLAN  1.  Syncope:  On day of admission pt lost consciousness suddenly and fell to ground.  He does not recall falling.  Ischemic w/u negative.  He has had pauses on tele, longest of 3.71 yesterday morning, 2.83 secs last pm.  Not clear if he was sleeping during either of these episodes.  Regardless, will d/c bb.  Check echo.  Ambulate today.  If he has  pauses, while awake and off of BB, will ask EP to see.  OTW, would plan outpt event monitor.  2.  Progressive midsternal chest pain:  CE neg.  Cath showed anomalous LM take off but otw nonobs dzs.  Cont med Rx - asa, nitrate.  D/c bb.  Add statin.  Check lipids.  Will give consideration to cardiac CTA in the future to r/o malignant course of anomalous LM.  3.  HTN:  Stable.  D/c bb as above.  4.  Lipids:  With mod CAD, add statin and check lipids.  LFT's ok.  5.  Morbid obesity:  Discussed need for diet and exercise.  6.  Tob Abuse/COPD:  Cessation advised.  Still smoking a few cigarettes/day.  Wheezing today.  Cont inhaler.  D/c bb.  7.  Hyperglycemia:  Mildly elevated.  Check A1c.  8.  Chronic hip pain:  Requesting percocet - he is on @ home.  9.  OSA:  Says he has been told that he has OSA but does not use CPAP.  Signed, Nicolasa Ducking NP  I have personally seen and examined this patient with Ward Givens, NP. I agree with the assessment and plan as outlined above. Pt admitted with syncope and chest pain. Cardiac cath 09/12/13 with no obstructive CAD. Smoking cessation is recommended. Pauses overnight as above. Hold beta blocker. Monitor on telemetry for 24 more hours. Echo today.   MCALHANY,CHRISTOPHER 10:11 AM 09/13/2013

## 2013-09-13 NOTE — Progress Notes (Signed)
Echocardiogram 2D Echocardiogram with Definity has been performed.  [name] 09/13/2013, 4:47 PM

## 2013-09-14 ENCOUNTER — Other Ambulatory Visit: Payer: Self-pay | Admitting: *Deleted

## 2013-09-14 DIAGNOSIS — E785 Hyperlipidemia, unspecified: Secondary | ICD-10-CM

## 2013-09-14 DIAGNOSIS — G4730 Sleep apnea, unspecified: Secondary | ICD-10-CM | POA: Diagnosis present

## 2013-09-14 DIAGNOSIS — F411 Generalized anxiety disorder: Secondary | ICD-10-CM

## 2013-09-14 DIAGNOSIS — R55 Syncope and collapse: Secondary | ICD-10-CM

## 2013-09-14 LAB — LIPID PANEL
CHOL/HDL RATIO: 6.3 ratio
Cholesterol: 157 mg/dL (ref 0–200)
HDL: 25 mg/dL — ABNORMAL LOW (ref >39)
LDL Cholesterol: 71 mg/dL (ref 0–99)
Triglycerides: 305 mg/dL — ABNORMAL HIGH (ref <150)
VLDL: 61 mg/dL — ABNORMAL HIGH (ref 0–40)

## 2013-09-14 MED ORDER — ATORVASTATIN CALCIUM 10 MG PO TABS: 10.0000 mg | ORAL_TABLET | Freq: Every day | ORAL | Status: DC

## 2013-09-14 MED ORDER — ACETAMINOPHEN 325 MG PO TABS: 650.0000 mg | ORAL_TABLET | ORAL | Status: DC | PRN

## 2013-09-14 NOTE — Discharge Instructions (Signed)
Arteriogram Care After These instructions give you information on caring for yourself after your procedure. Your doctor may also give you more specific instructions. Call your doctor if you have any problems or questions after your procedure. HOME CARE  Stay in bed the rest of the day.  Keep your leg straight for at least 6 hours.  Do not lift anything heavier than 10 pounds (about a gallon of milk) for 2 days.  Do not walk a lot, run, or drive for 2 days.  Return to normal activities in 2 days or as told by your doctor. Finding out the results of your testSyncope Syncope is a fainting spell. This means the person loses consciousness and drops to the ground. The person is generally unconscious for less than 5 minutes. The person may have some muscle twitches for up to 15 seconds before waking up and returning to normal. Syncope occurs more often in elderly people, but it can happen to anyone. While most causes of syncope are not dangerous, syncope can be a sign of a serious medical problem. It is important to seek medical care.  CAUSES  Syncope is caused by a sudden decrease in blood flow to the brain. The specific cause is often not determined. Factors that can trigger syncope include:  Taking medicines that lower blood pressure.  Sudden changes in posture, such as standing up suddenly.  Taking more medicine than prescribed.  Standing in one place for too long.  Seizure disorders.  Dehydration and excessive exposure to heat.  Low blood sugar (hypoglycemia).  Straining to have a bowel movement.  Heart disease, irregular heartbeat, or other circulatory problems.  Fear, emotional distress, seeing blood, or severe pain. SYMPTOMS  Right before fainting, you may:  Feel dizzy or lightheaded.  Feel nauseous.  See all white or all black in your field of vision.  Have cold, clammy skin. DIAGNOSIS  Your caregiver will ask about your symptoms, perform a physical exam, and  perform electrocardiography (ECG) to record the electrical activity of your heart. Your caregiver may also perform other heart or blood tests to determine the cause of your syncope. TREATMENT  In most cases, no treatment is needed. Depending on the cause of your syncope, your caregiver may recommend changing or stopping some of your medicines. HOME CARE INSTRUCTIONS  Have someone stay with you until you feel stable.  Do not drive, operate machinery, or play sports until your caregiver says it is okay.  Keep all follow-up appointments as directed by your caregiver.  Lie down right away if you start feeling like you might faint. Breathe deeply and steadily. Wait until all the symptoms have passed.  Drink enough fluids to keep your urine clear or pale yellow.  If you are taking blood pressure or heart medicine, get up slowly, taking several minutes to sit and then stand. This can reduce dizziness. SEEK IMMEDIATE MEDICAL CARE IF:   You have a severe headache.  You have unusual pain in the chest, abdomen, or back.  You are bleeding from the mouth or rectum, or you have black or tarry stool.  You have an irregular or very fast heartbeat.  You have pain with breathing.  You have repeated fainting or seizure-like jerking during an episode.  You faint when sitting or lying down.  You have confusion.  You have difficulty walking.  You have severe weakness.  You have vision problems. If you fainted, call your local emergency services (911 in [place] Do not drive yourself  to the hospital.  MAKE SURE YOU:  Understand these instructions.  Will watch your condition.  Will get help right away if you are not doing well or get worse. Document Released: 08/09/2005 Document Revised: 02/08/2012 Document Reviewed: 10/08/2011 [facility] Patient Information 2014 [place], [place].  Ask when your test results will be ready. Make sure you get your test results. GET HELP RIGHT AWAY IF:   You  have fever of 102 F (38.9 C) or higher.  You have more pain in your leg.  The leg that was cut is:  Bleeding.  Puffy (swollen) or red.  Cold.  Pale or changes color.  Weak.  Tingly or numb. If you go to the Emergency Room, tell your nurse that you have had an arteriogram. Take this paper with you to show the nurse. MAKE SURE YOU:  Understand these instructions.  Will watch your condition.  Will get help right away if you are not doing well or get worse. Document Released: 11/05/2008 Document Revised: 11/01/2011 Document Reviewed: 11/05/2008 [facility] Patient Information 2014 [place], [place].

## 2013-09-14 NOTE — Discharge Summary (Signed)
Patient ID: [name],  MRN: [id number], DOB/AGE: 56-Aug-1957 56 y.o.  Admit date: 09/11/2013 Discharge date: 09/14/2013  Primary Care Provider: Neale Burly, MD   Primary Cardiologist: Dr Domenic Polite (new)  Discharge Diagnoses Principal Problem:   Syncope- beta blocker stopped 09/11/13 Active Problems:   Chest pain with high risk of acute coronary syndrome   CAD - 3d cath 09/13/13- moderate CAD, 50% OM3, 40% RCA   Dyslipidemia   OBESITY, MORBID   ANXIETY DISORDER, GENERALIZED   HYPERTENSION, BENIGN SYSTEMIC   COPD   BACK PAIN, LOW   Sleep apnea- C-pap intol    Procedures: Cath 09/13/13   Hospital Course:   58 y/o obese male with a history of prior CAD with previous cath in 2000 and 2012 at [place]. He was visiting a friend in a nursing home when he had syncope. [facility] where he complained of chest pain. He was admitted and monitored. Troponin's were negative for MI. He had sinus arrythmia with occasional sinus pause on telemetry. He has sleep apnea but has been intolerant to C-pap. Beta blocker was stopped.  He was transferred to [facility] a nd had a cath on 09/12/13 which revealed moderate CAD with 70% distal RCA, 50% OM3, with NL LVF at cath and by echo. He was monitored another 24 hrs and we feel he is stable for discharge 09/14/13. He will be set up for a monitor as an OP and follow up in the [place] office.   Discharge Vitals:  Blood pressure 149/64, pulse 79, temperature 97.7 F (36.5 C), temperature source Oral, resp. rate 20, height _0  (1.905 m), weight 328 lb 14.8 oz (149.2 kg), SpO2 95.00%.    Labs: Results for orders placed during the hospital encounter of 09/11/13 (from the past 48 hour(s))  TROPONIN I     Status: None   Collection Time    09/12/13 11:12 AM      Result Value Range   Troponin I <0.30  <0.30 ng/mL   Comment:            Due to the release kinetics of cTnI,     a negative result within the first hours     of the onset of symptoms does not rule  out     myocardial infarction with certainty.     If myocardial infarction is still suspected,     repeat the test at appropriate intervals.  PROTIME-INR     Status: None   Collection Time    09/12/13  1:00 PM      Result Value Range   Prothrombin Time 13.3  11.6 - 15.2 seconds   INR 1.03  0.00 - 1.49  TSH     Status: None   Collection Time    09/13/13 11:15 AM      Result Value Range   TSH 2.231  0.350 - 4.500 uIU/mL   Comment: Performed at [facility]     Status: Abnormal   Collection Time    09/13/13 11:15 AM      Result Value Range   Hemoglobin A1C 5.9 (*) <5.7 %   Comment: (NOTE)  According to the ADA Clinical Practice Recommendations for 2011, when     HbA1c is used as a screening test:      >=6.5%   Diagnostic of Diabetes Mellitus               (if abnormal result is confirmed)     5.7-6.4%   Increased risk of developing Diabetes Mellitus     References:Diagnosis and Classification of Diabetes Mellitus,Diabetes     MOQH,4765,46(TKPTW 1):S62-S69 and Standards of Medical Care in             Diabetes - 2011,Diabetes SFKC,1275,17 (Suppl 1):S11-S61.   Mean Plasma Glucose 123 (*) <117 mg/dL   Comment: Performed at [facility]     Status: Abnormal   Collection Time    09/13/13 11:15 AM      Result Value Range   Sodium 137  137 - 147 mEq/L   Potassium 3.7  3.7 - 5.3 mEq/L   Chloride 98  96 - 112 mEq/L   CO2 25  19 - 32 mEq/L   Glucose, Bld 150 (*) 70 - 99 mg/dL   BUN 13  6 - 23 mg/dL   Creatinine, Ser 0.90  0.50 - 1.35 mg/dL   Calcium 9.2  8.4 - 10.5 mg/dL   GFR calc non Af Amer >90  >90 mL/min   GFR calc Af Amer >90  >90 mL/min   Comment: (NOTE)     The eGFR has been calculated using the CKD EPI equation.     This calculation has not been validated in all clinical situations.     eGFR's persistently <90 mL/min signify possible Chronic  Kidney     Disease.  LIPID PANEL     Status: Abnormal   Collection Time    09/14/13  5:10 AM      Result Value Range   Cholesterol 157  0 - 200 mg/dL   Triglycerides 305 (*) <150 mg/dL   HDL 25 (*) >39 mg/dL   Total CHOL/HDL Ratio 6.3     VLDL 61 (*) 0 - 40 mg/dL   LDL Cholesterol 71  0 - 99 mg/dL   Comment:            Total Cholesterol/HDL:CHD Risk     Coronary Heart Disease Risk Table                         Men   Women      1/2 Average Risk   3.4   3.3      Average Risk       5.0   4.4      2 X Average Risk   9.6   7.1      3 X Average Risk  23.4   11.0                Use the calculated Patient Ratio     above and the CHD Risk Table     to determine the patient's CHD Risk.                ATP III CLASSIFICATION (LDL):      <100     mg/dL   Optimal      100-129  mg/dL   Near or Above                        Optimal  130-159  mg/dL   Borderline      160-189  mg/dL   High      >190     mg/dL   Very High    Disposition:  Follow-up Information   Follow up with Rozann Lesches, MD On 09/21/2013. (9:20. Office will call you about a monitor)    Specialty:  Cardiology   Contact information:   [place] [address] [phone]       Discharge Medications:    Medication List    STOP taking these medications       furosemide 20 MG tablet  Commonly known as:  LASIX     metoprolol tartrate 25 MG tablet  Commonly known as:  LOPRESSOR      TAKE these medications       acetaminophen 325 MG tablet  Commonly known as:  TYLENOL  Take 2 tablets (650 mg total) by mouth every 4 (four) hours as needed for headache or mild pain.     ALPRAZolam 1 MG tablet  Commonly known as:  XANAX  Take 1 tablet (1 mg total) by mouth 3 (three) times daily as needed for anxiety. For anxiety     amLODipine 5 MG tablet  Commonly known as:  NORVASC  Take 5 mg by mouth daily.     aspirin 81 MG tablet  Take 1 tablet (81 mg total) by mouth daily.     atorvastatin  10 MG tablet  Commonly known as:  LIPITOR  Take 1 tablet (10 mg total) by mouth daily at 6 PM.     budesonide-formoterol 160-4.5 MCG/ACT inhaler  Commonly known as:  SYMBICORT  Inhale 2 puffs into the lungs 2 (two) times daily.     citalopram 40 MG tablet  Commonly known as:  CELEXA  Take 40 mg by mouth daily.     hydrochlorothiazide 25 MG tablet  Commonly known as:  HYDRODIURIL  Take 1 tablet by mouth daily.     hydrOXYzine 25 MG capsule  Commonly known as:  VISTARIL  Take 25 mg by mouth at bedtime. For itching     ipratropium-albuterol 0.5-2.5 (3) MG/3ML Soln  Commonly known as:  DUONEB  Take 3 mLs by nebulization 2 (two) times daily.     isosorbide mononitrate 30 MG 24 hr tablet  Commonly known as:  IMDUR  Take 30 mg by mouth daily.     meloxicam 15 MG tablet  Commonly known as:  MOBIC  Take 1 tablet by mouth daily.     nitroGLYCERIN 0.4 MG SL tablet  Commonly known as:  NITROSTAT  Place 0.4 mg under the tongue every 5 (five) minutes as needed. For chest pain     omeprazole 20 MG capsule  Commonly known as:  PRILOSEC  Take 20 mg by mouth daily.     oxyCODONE-acetaminophen 10-325 MG per tablet  Commonly known as:  PERCOCET  Take 1 tablet by mouth every 8 (eight) hours as needed for pain.     potassium chloride SA 20 MEQ tablet  Commonly known as:  K-DUR,KLOR-CON  Take 1 tablet by mouth daily.     traZODone 100 MG tablet  Commonly known as:  DESYREL  Take 100 mg by mouth at bedtime as needed for sleep.     vitamin C 500 MG tablet  Commonly known as:  ASCORBIC ACID  Take 500 mg by mouth daily.         Duration of Discharge Encounter:  40 minutes including physician time.  Signed, Kerin Ransom PA-C 09/14/2013 8:32 AM  I have examined the patient and reviewed assessment and plan and discussed with patient.  Agree with above as stated.   Medical therapy. No rate slowing drugs due to pauses. OK to for d/c today if he can walk without CP. Pauses have  resolved off of beta blocker. F/u with Dr. Domenic Polite.  Spoke to his wife and answered all questions.  She mentioned that he is quite anxious.  Jema Deegan S.

## 2013-09-14 NOTE — Progress Notes (Signed)
SUBJECTIVE:  No CP with walking  OBJECTIVE:   Vitals:   Filed Vitals:   09/13/13 1930 09/14/13 0001 09/14/13 0400 09/14/13 0710  BP: 146/43 109/37 165/75 149/64  Pulse: 85 86 67 79  Temp: 98.1 F (36.7 C) 98.7 F (37.1 C) 98.1 F (36.7 C) 97.7 F (36.5 C)  TempSrc: Oral Oral Oral Oral  Resp: 18 20 20 20   Height:      Weight:      SpO2: 95% 94% 95% 95%   I&O's:   Intake/Output Summary (Last 24 hours) at 09/14/13 9093 Last data filed at 09/14/13 0300  Gross per 24 hour  Intake   1480 ml  Output   2475 ml  Net   -995 ml   TELEMETRY: Reviewed telemetry pt in NSR; prior pauses resolved off of beta blocker:     PHYSICAL EXAM General: Well developed, well nourished, in no acute distress Head: Eyes PERRLA, No xanthomas.   Normal cephalic and atramatic  Lungs:   Clear bilaterally to auscultation and percussion. Heart:  HRRR S1 S2 Pulses are 2+ & equal.            No carotid bruit. No JVD.  No abdominal bruits. No femoral bruits. Abdomen: Bowel sounds are positive, abdomen soft and non-tender without masses or                  Hernia's noted. Msk:  Back normal, normal gait. Normal strength and tone for age. Extremities:  No clubbing, cyanosis or edema.  2+ right radial pulse Neuro: Alert and oriented X 3. Psych:  Flat affect, responds appropriately   LABS: Basic Metabolic Panel:  Recent Labs  07/13/61 0516 09/13/13 1115  NA 141 137  K 3.6* 3.7  CL 102 98  CO2 27 25  GLUCOSE 113* 150*  BUN 14 13  CREATININE 0.95 0.90  CALCIUM 9.2 9.2   Liver Function Tests:  Recent Labs  09/11/13 1937 09/12/13 0516  AST 22 16  ALT 25 21  ALKPHOS 51 48  BILITOT 0.3 0.4  PROT 7.8 7.1  ALBUMIN 3.8 3.4*   No results found for this basename: LIPASE, AMYLASE,  in the last 72 hours CBC:  Recent Labs  09/11/13 1937 09/12/13 0516  WBC 11.3* 9.4  NEUTROABS 5.3  --   HGB 14.7 13.6  HCT 42.4 39.9  MCV 94.9 95.2  PLT 258 219   Cardiac Enzymes:  Recent Labs   09/11/13 2329 09/12/13 0516 09/12/13 1112  TROPONINI <0.30 <0.30 <0.30   BNP: No components found with this basename: POCBNP,  D-Dimer:  Recent Labs  09/11/13 1938  DDIMER 0.35   Hemoglobin A1C:  Recent Labs  09/13/13 1115  HGBA1C 5.9*   Fasting Lipid Panel:  Recent Labs  09/14/13 0510  CHOL 157  HDL 25*  LDLCALC 71  TRIG 446*  CHOLHDL 6.3   Thyroid Function Tests:  Recent Labs  09/13/13 1115  TSH 2.231   Anemia Panel: No results found for this basename: VITAMINB12, FOLATE, FERRITIN, TIBC, IRON, RETICCTPCT,  in the last 72 hours Coag Panel:   Lab Results  Component Value Date   INR 1.03 09/12/2013   INR 0.91 05/03/2012   INR 1.00 07/09/2011    RADIOLOGY: Dg Chest 2 View  09/11/2013   CLINICAL DATA:  Weakness.  EXAM: CHEST  2 VIEW  COMPARISON:  08/23/2013  FINDINGS: Two views of the chest demonstrate prominent lung markings that appear chronic. No evidence for overt pulmonary edema or  airspace disease. Heart size is within normal limits and stable. Degenerative changes in thoracic spine. No evidence for pleural effusions.  IMPRESSION: Chronic lung changes without acute findings.   Electronically Signed   By: Richarda OverlieAdam  Henn M.D.   On: 09/11/2013 20:11      ASSESSMENT: 1. Mild to moderate non-obstructive CAD  2. Preserved LV systolic function  3. Non-cardiac chest pain   PLAN:  Medical therapy. No rate slowing drugs due to pauses.  OK to for d/c today if he can walk without CP.  Pauses have resolved off of beta blocker. F/u with Dr. Diona BrownerMcDowell.  Corky CraftsVARANASI,Carron Jaggi S., MD  09/14/2013  7:28 AM

## 2013-09-21 ENCOUNTER — Ambulatory Visit (INDEPENDENT_AMBULATORY_CARE_PROVIDER_SITE_OTHER): Payer: Medicare Other | Admitting: Cardiology

## 2013-09-21 ENCOUNTER — Encounter: Payer: Self-pay | Admitting: Cardiology

## 2013-09-21 VITALS — BP 134/79 | HR 81 | Ht 75.0 in | Wt 347.0 lb

## 2013-09-21 DIAGNOSIS — R55 Syncope and collapse: Secondary | ICD-10-CM

## 2013-09-21 DIAGNOSIS — F17200 Nicotine dependence, unspecified, uncomplicated: Secondary | ICD-10-CM

## 2013-09-21 DIAGNOSIS — Z720 Tobacco use: Secondary | ICD-10-CM

## 2013-09-21 DIAGNOSIS — I2510 Atherosclerotic heart disease of native coronary artery without angina pectoris: Secondary | ICD-10-CM

## 2013-09-21 DIAGNOSIS — I10 Essential (primary) hypertension: Secondary | ICD-10-CM

## 2013-09-21 NOTE — Assessment & Plan Note (Signed)
Overall mild to moderate nonobstructive disease with some branch vessel disease, normal LVEF, anomalous origin of left main. He was evaluated by Dr. Clifton James and it was felt medical therapy was most appropriate. He had no evidence of ACS. Plan to continue aspirin, statin, Norvasc, Imdur. Not clear that further imaging of the left main is necessary at this time, although we can certainly consider a CTA ultimately. His weight would be a limitation.

## 2013-09-21 NOTE — Progress Notes (Signed)
Clinical Summary [name] is a 58 y.o.male presenting for a post hospital visit, [facility]. [facility] was [facility] after an episode of reported syncope, also chest pain symptoms. He ruled out for myocardial infarction, was taken off beta blocker during observation due to occasional sinus pauses by telemetry. Ultimately underwent a diagnostic cardiac catheterization that revealed moderate CAD that was managed medically with a 70% distal RCA, 50% OM 3, and normal LV function. 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 A followup cardiac monitor was arranged at discharge.  Recent lipid panel showed cholesterol 157, triglycerides 305, HDL 25, LDL 71. He continues on Lipitor.  He is here with his wife today. States he feels better in general. Still has some episodes of chest pain when he is anxious or upset. States that his mother is currently in Hospice. He states he is trying to cut back smoking, we also discussed reducing his alcohol intake. He seems to be interested in trying to lose some weight which I think would help significantly. He remains chronically short of breath, most likely multifactorial.  Reviewed the results of his cardiac catheterization and a plan for medical therapy now. He does have an event monitor which he just received in the mail, has yet to hook it up.   Allergies  Allergen Reactions  . Ativan [Lorazepam] Other (See Comments)    "out of mind"  . Dilaudid [Hydromorphone Hcl] Other (See Comments)    " out of mind"  . Haloperidol Decanoate     Current Outpatient Prescriptions  Medication Sig Dispense Refill  . ALPRAZolam (XANAX) 1 MG tablet Take 1 tablet (1 mg total) by mouth 3 (three) times daily as needed for anxiety. For anxiety  30 tablet  0  . amLODipine (NORVASC) 5 MG tablet Take 5 mg by mouth daily.        Marland Kitchen. aspirin 81 MG tablet Take 1 tablet (81 mg total) by mouth  daily.  30 tablet  0  . atorvastatin (LIPITOR) 10 MG tablet Take 1 tablet (10 mg total) by mouth daily at 6 PM.  30 tablet  11  . budesonide-formoterol (SYMBICORT) 160-4.5 MCG/ACT inhaler Inhale 2 puffs into the lungs 2 (two) times daily.      . citalopram (CELEXA) 40 MG tablet Take 40 mg by mouth daily.        . hydrochlorothiazide (HYDRODIURIL) 25 MG tablet Take 1 tablet by mouth daily.      . hydrOXYzine (VISTARIL) 25 MG capsule Take 25 mg by mouth at bedtime. For itching      . ipratropium-albuterol (DUONEB) 0.5-2.5 (3) MG/3ML SOLN Take 3 mLs by nebulization 2 (two) times daily.      . isosorbide mononitrate (IMDUR) 30 MG 24 hr tablet Take 30 mg by mouth daily.        . meloxicam (MOBIC) 15 MG tablet Take 1 tablet by mouth daily.      . nitroGLYCERIN (NITROSTAT) 0.4 MG SL tablet Place 0.4 mg under the tongue every 5 (five) minutes as needed. For chest pain      . omeprazole (PRILOSEC) 20 MG capsule Take 20 mg by mouth daily.      Marland Kitchen. oxyCODONE-acetaminophen (PERCOCET) 10-325 MG per tablet Take 1 tablet by mouth every 8 (eight) hours as needed for pain.      . potassium chloride SA (K-DUR,KLOR-CON) 20 MEQ tablet Take 1 tablet  by mouth daily.      Marland Kitchen PROAIR HFA 108 (90 BASE) MCG/ACT inhaler       . traZODone (DESYREL) 100 MG tablet Take 100 mg by mouth at bedtime as needed for sleep.       . vitamin C (ASCORBIC ACID) 500 MG tablet Take 500 mg by mouth daily.       No current facility-administered medications for this visit.    Past Medical History  Diagnosis Date  . COPD (chronic obstructive pulmonary disease)   . Essential hypertension, benign   . Chronic back pain   . Anxiety disorder   . Hyperlipidemia   . Depression   . Obesity   . History of alcohol abuse   . History of cocaine abuse   . Pneumonia   . GERD (gastroesophageal reflux disease)   . Headache(784.0)   . Arthritis   . CAD (coronary artery disease)     a. ~ 2000 cath at [facility];  b. 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    Social History [name] reports that he has been smoking Cigarettes.  He has a 9.75 pack-year smoking history. He does not have any smokeless tobacco history on file. [name] reports that he does not drink alcohol.  Review of Systems No recurrent syncope. Otherwise as outlined above.  Physical Examination Filed Vitals:   09/21/13 0921  BP: 134/79  Pulse: 81   Filed Weights   09/21/13 0921  Weight: 347 lb (157.398 kg)    Morbidly obese male, no distress.  HEENT: Conjunctiva and lids normal, oropharynx clear.  Neck: Supple, increased girth, difficult to assess JVP, no thyromegaly.  Lungs: Clear to auscultation, decreased breath sounds, nonlabored breathing at rest.  Cardiac: Regular rate and rhythm, no S3 or significant systolic murmur, no pericardial rub.  Abdomen: Soft, nontender, obese, bowel sounds present.  Extremities: Chronic appearing mild edema, distal pulses 2+.  Skin: Warm and dry. Multiple tattoos.  Musculoskeletal: No kyphosis.  Neuropsychiatric: Alert and oriented x3, affect grossly appropriate.   Problem List and Plan   Coronary atherosclerosis of native coronary artery Overall mild to moderate nonobstructive disease with some branch vessel disease, normal LVEF, anomalous origin of left main. He was evaluated by [name] and it was felt medical therapy was most appropriate. He had no evidence of ACS. Plan to continue aspirin, statin, Norvasc, Imdur. Not clear that further imaging of the left main is necessary at this time, although we can certainly consider a CTA ultimately. His weight would be a limitation.  Essential hypertension, benign Continue current regimen.  Syncope No recurrence. Etiology not entirely clear. Unlikely to be related to degree of CAD. No evidence of ACS. He had some sinus pauses on  telemetry when on beta blocker, now discontinued. Will followup on further cardiac monitoring as an outpatient.  OBESITY, MORBID Weight loss discussed.  Tobacco abuse Smoking cessation discussed.    Jonelle Sidle, M.D., F.A.C.C.

## 2013-09-21 NOTE — Assessment & Plan Note (Signed)
Weight loss discussed  

## 2013-09-21 NOTE — Assessment & Plan Note (Signed)
Smoking cessation discussed 

## 2013-09-21 NOTE — Assessment & Plan Note (Signed)
No recurrence. Etiology not entirely clear. Unlikely to be related to degree of CAD. No evidence of ACS. He had some sinus pauses on telemetry when on beta blocker, now discontinued. Will followup on further cardiac monitoring as an outpatient.

## 2013-09-21 NOTE — Assessment & Plan Note (Signed)
Continue current regimen

## 2013-09-21 NOTE — Patient Instructions (Signed)
   Please wear monitor as previously discussed Office will contact with results via phone or letter.   Continue all current medications. Keep follow up with Dr. Olena Leatherwood Your physician wants you to follow up in: 6 months.  You will receive a reminder letter in the mail one-two months in advance.  If you don't receive a letter, please call our office to schedule the follow up appointment

## 2013-09-24 ENCOUNTER — Telehealth: Payer: Self-pay | Admitting: *Deleted

## 2013-09-24 NOTE — Telephone Encounter (Signed)
Noted. We will have to do the best that we can with limited information.

## 2013-09-24 NOTE — Telephone Encounter (Signed)
Received information from [employer] saying that event monitor was terminated. Nurse called patient to inquire about this since recent visit stated that patient would be wearing monitor. Wife informed nurse that patient did not want to wear the monitor and was aware that MD wanted this done to determine if his heart was causing the syncope. Wife said that patient only wore the monitor for 1 day and refuses to put it back on.

## 2013-12-05 ENCOUNTER — Encounter (HOSPITAL_COMMUNITY): Payer: Self-pay | Admitting: Emergency Medicine

## 2013-12-05 ENCOUNTER — Emergency Department (HOSPITAL_COMMUNITY): Payer: Medicare Other

## 2013-12-05 ENCOUNTER — Observation Stay (HOSPITAL_COMMUNITY)
Admission: EM | Admit: 2013-12-05 | Discharge: 2013-12-07 | DRG: 189 | Disposition: A | Discharge status: Home / Self Care | Payer: Medicare Other | Attending: Internal Medicine | Admitting: Internal Medicine

## 2013-12-05 DIAGNOSIS — I5031 Acute diastolic (congestive) heart failure: Secondary | ICD-10-CM | POA: Diagnosis present

## 2013-12-05 DIAGNOSIS — I2510 Atherosclerotic heart disease of native coronary artery without angina pectoris: Secondary | ICD-10-CM | POA: Diagnosis present

## 2013-12-05 DIAGNOSIS — I509 Heart failure, unspecified: Secondary | ICD-10-CM | POA: Diagnosis present

## 2013-12-05 DIAGNOSIS — E876 Hypokalemia: Secondary | ICD-10-CM | POA: Diagnosis present

## 2013-12-05 DIAGNOSIS — K219 Gastro-esophageal reflux disease without esophagitis: Secondary | ICD-10-CM | POA: Diagnosis present

## 2013-12-05 DIAGNOSIS — M549 Dorsalgia, unspecified: Secondary | ICD-10-CM | POA: Diagnosis present

## 2013-12-05 DIAGNOSIS — F339 Major depressive disorder, recurrent, unspecified: Secondary | ICD-10-CM

## 2013-12-05 DIAGNOSIS — F411 Generalized anxiety disorder: Secondary | ICD-10-CM

## 2013-12-05 DIAGNOSIS — Z720 Tobacco use: Secondary | ICD-10-CM

## 2013-12-05 DIAGNOSIS — F17200 Nicotine dependence, unspecified, uncomplicated: Secondary | ICD-10-CM | POA: Diagnosis present

## 2013-12-05 DIAGNOSIS — R079 Chest pain, unspecified: Secondary | ICD-10-CM | POA: Diagnosis present

## 2013-12-05 DIAGNOSIS — I10 Essential (primary) hypertension: Secondary | ICD-10-CM | POA: Diagnosis present

## 2013-12-05 DIAGNOSIS — G4730 Sleep apnea, unspecified: Secondary | ICD-10-CM

## 2013-12-05 DIAGNOSIS — Z79899 Other long term (current) drug therapy: Secondary | ICD-10-CM

## 2013-12-05 DIAGNOSIS — J449 Chronic obstructive pulmonary disease, unspecified: Secondary | ICD-10-CM

## 2013-12-05 DIAGNOSIS — J9600 Acute respiratory failure, unspecified whether with hypoxia or hypercapnia: Principal | ICD-10-CM | POA: Diagnosis present

## 2013-12-05 DIAGNOSIS — E785 Hyperlipidemia, unspecified: Secondary | ICD-10-CM

## 2013-12-05 DIAGNOSIS — Z96649 Presence of unspecified artificial hip joint: Secondary | ICD-10-CM

## 2013-12-05 DIAGNOSIS — R55 Syncope and collapse: Secondary | ICD-10-CM

## 2013-12-05 DIAGNOSIS — M129 Arthropathy, unspecified: Secondary | ICD-10-CM | POA: Diagnosis present

## 2013-12-05 DIAGNOSIS — Z7982 Long term (current) use of aspirin: Secondary | ICD-10-CM

## 2013-12-05 DIAGNOSIS — I25118 Atherosclerotic heart disease of native coronary artery with other forms of angina pectoris: Secondary | ICD-10-CM | POA: Diagnosis present

## 2013-12-05 DIAGNOSIS — G8929 Other chronic pain: Secondary | ICD-10-CM | POA: Diagnosis present

## 2013-12-05 DIAGNOSIS — J811 Chronic pulmonary edema: Secondary | ICD-10-CM

## 2013-12-05 DIAGNOSIS — J441 Chronic obstructive pulmonary disease with (acute) exacerbation: Secondary | ICD-10-CM

## 2013-12-05 DIAGNOSIS — Z8249 Family history of ischemic heart disease and other diseases of the circulatory system: Secondary | ICD-10-CM

## 2013-12-05 LAB — CBC WITH DIFFERENTIAL/PLATELET
Basophils Absolute: 0.1 10*3/uL (ref 0.0–0.1)
Basophils Relative: 1 % (ref 0–1)
EOS PCT: 5 % (ref 0–5)
Eosinophils Absolute: 0.5 10*3/uL (ref 0.0–0.7)
HCT: 38.3 % — ABNORMAL LOW (ref 39.0–52.0)
HEMOGLOBIN: 13.2 g/dL (ref 13.0–17.0)
Lymphocytes Relative: 21 % (ref 12–46)
Lymphs Abs: 1.9 10*3/uL (ref 0.7–4.0)
MCH: 32.3 pg (ref 26.0–34.0)
MCHC: 34.5 g/dL (ref 30.0–36.0)
MCV: 93.6 fL (ref 78.0–100.0)
Monocytes Absolute: 1.3 10*3/uL — ABNORMAL HIGH (ref 0.1–1.0)
Monocytes Relative: 14 % — ABNORMAL HIGH (ref 3–12)
NEUTROS PCT: 59 % (ref 43–77)
Neutro Abs: 5.4 10*3/uL (ref 1.7–7.7)
Platelets: 211 10*3/uL (ref 150–400)
RBC: 4.09 MIL/uL — AB (ref 4.22–5.81)
RDW: 12.7 % (ref 11.5–15.5)
WBC: 9.2 10*3/uL (ref 4.0–10.5)

## 2013-12-05 LAB — D-DIMER, QUANTITATIVE: D-Dimer, Quant: 0.4 ug/mL-FEU (ref 0.00–0.48)

## 2013-12-05 LAB — CBC
HEMATOCRIT: 37.2 % — AB (ref 39.0–52.0)
HEMOGLOBIN: 12.9 g/dL — AB (ref 13.0–17.0)
MCH: 32.8 pg (ref 26.0–34.0)
MCHC: 34.7 g/dL (ref 30.0–36.0)
MCV: 94.7 fL (ref 78.0–100.0)
Platelets: 193 10*3/uL (ref 150–400)
RBC: 3.93 MIL/uL — AB (ref 4.22–5.81)
RDW: 12.8 % (ref 11.5–15.5)
WBC: 8.3 10*3/uL (ref 4.0–10.5)

## 2013-12-05 LAB — COMPREHENSIVE METABOLIC PANEL
ALT: 28 U/L (ref 0–53)
AST: 26 U/L (ref 0–37)
Albumin: 3.7 g/dL (ref 3.5–5.2)
Alkaline Phosphatase: 61 U/L (ref 39–117)
BUN: 15 mg/dL (ref 6–23)
CALCIUM: 9.1 mg/dL (ref 8.4–10.5)
CO2: 24 mEq/L (ref 19–32)
CREATININE: 1.02 mg/dL (ref 0.50–1.35)
Chloride: 98 mEq/L (ref 96–112)
GFR, EST NON AFRICAN AMERICAN: 81 mL/min — AB (ref >90)
GLUCOSE: 126 mg/dL — AB (ref 70–99)
Potassium: 3.6 mEq/L — ABNORMAL LOW (ref 3.7–5.3)
Sodium: 136 mEq/L — ABNORMAL LOW (ref 137–147)
Total Bilirubin: 0.4 mg/dL (ref 0.3–1.2)
Total Protein: 7.6 g/dL (ref 6.0–8.3)

## 2013-12-05 LAB — TROPONIN I
Troponin I: <0.3 ng/mL (ref <0.30)
Troponin I: <0.3 ng/mL (ref <0.30)

## 2013-12-05 LAB — PRO B NATRIURETIC PEPTIDE: PRO B NATRI PEPTIDE: 21.1 pg/mL (ref 0–125)

## 2013-12-05 MED ORDER — ISOSORBIDE MONONITRATE ER 30 MG PO TB24
30.0000 mg | ORAL_TABLET | Freq: Every day | ORAL | Status: DC
  Administered 2013-12-05 – 2013-12-07 (×3): 30 mg via ORAL
  Filled 2013-12-05 (×3): qty 1

## 2013-12-05 MED ORDER — CITALOPRAM HYDROBROMIDE 20 MG PO TABS
40.0000 mg | ORAL_TABLET | Freq: Every day | ORAL | Status: DC
  Administered 2013-12-05 – 2013-12-07 (×3): 40 mg via ORAL
  Filled 2013-12-05: qty 1
  Filled 2013-12-05 (×3): qty 2

## 2013-12-05 MED ORDER — MORPHINE SULFATE 2 MG/ML IJ SOLN
2.0000 mg | INTRAMUSCULAR | Status: DC | PRN
  Administered 2013-12-05 (×3): 2 mg via INTRAVENOUS
  Filled 2013-12-05 (×3): qty 1

## 2013-12-05 MED ORDER — ASPIRIN 81 MG PO CHEW
81.0000 mg | CHEWABLE_TABLET | Freq: Every day | ORAL | Status: DC
  Administered 2013-12-05 – 2013-12-07 (×3): 81 mg via ORAL
  Filled 2013-12-05 (×3): qty 1

## 2013-12-05 MED ORDER — MOMETASONE FURO-FORMOTEROL FUM 200-5 MCG/ACT IN AERO
2.0000 | INHALATION_SPRAY | Freq: Two times a day (BID) | RESPIRATORY_TRACT | Status: DC
  Administered 2013-12-05 – 2013-12-06 (×4): 2 via RESPIRATORY_TRACT
  Filled 2013-12-05: qty 8.8

## 2013-12-05 MED ORDER — FUROSEMIDE 10 MG/ML IJ SOLN
20.0000 mg | Freq: Once | INTRAMUSCULAR | Status: AC
  Administered 2013-12-05: 20 mg via INTRAVENOUS
  Filled 2013-12-05: qty 2

## 2013-12-05 MED ORDER — MORPHINE SULFATE 4 MG/ML IJ SOLN
4.0000 mg | Freq: Once | INTRAMUSCULAR | Status: AC
  Administered 2013-12-05: 4 mg via INTRAVENOUS
  Filled 2013-12-05: qty 1

## 2013-12-05 MED ORDER — SODIUM CHLORIDE 0.9 % IJ SOLN: 3.0000 mL | INTRAMUSCULAR | Status: DC | PRN

## 2013-12-05 MED ORDER — HYDROXYZINE HCL 25 MG PO TABS
25.0000 mg | ORAL_TABLET | Freq: Every day | ORAL | Status: DC
  Administered 2013-12-05 – 2013-12-06 (×2): 25 mg via ORAL
  Filled 2013-12-05 (×2): qty 1

## 2013-12-05 MED ORDER — POTASSIUM CHLORIDE CRYS ER 20 MEQ PO TBCR
20.0000 meq | EXTENDED_RELEASE_TABLET | Freq: Every day | ORAL | Status: DC
  Administered 2013-12-05 – 2013-12-06 (×2): 20 meq via ORAL
  Filled 2013-12-05 (×2): qty 1

## 2013-12-05 MED ORDER — ENOXAPARIN SODIUM 40 MG/0.4ML ~~LOC~~ SOLN
40.0000 mg | SUBCUTANEOUS | Status: DC
  Filled 2013-12-05: qty 0.4

## 2013-12-05 MED ORDER — IPRATROPIUM-ALBUTEROL 0.5-2.5 (3) MG/3ML IN SOLN
3.0000 mL | Freq: Once | RESPIRATORY_TRACT | Status: AC
  Administered 2013-12-05: 3 mL via RESPIRATORY_TRACT
  Filled 2013-12-05: qty 3

## 2013-12-05 MED ORDER — MORPHINE SULFATE 4 MG/ML IJ SOLN: 1.0000 mg | Freq: Once | INTRAMUSCULAR | Status: DC

## 2013-12-05 MED ORDER — FUROSEMIDE 10 MG/ML IJ SOLN
20.0000 mg | Freq: Two times a day (BID) | INTRAMUSCULAR | Status: DC
  Administered 2013-12-05 – 2013-12-06 (×3): 20 mg via INTRAVENOUS
  Filled 2013-12-05 (×3): qty 2

## 2013-12-05 MED ORDER — ASPIRIN 81 MG PO CHEW
324.0000 mg | CHEWABLE_TABLET | Freq: Once | ORAL | Status: AC
  Administered 2013-12-05: 324 mg via ORAL
  Filled 2013-12-05: qty 4

## 2013-12-05 MED ORDER — POTASSIUM CHLORIDE 20 MEQ/15ML (10%) PO LIQD
20.0000 meq | Freq: Once | ORAL | Status: AC
  Administered 2013-12-05: 20 meq via ORAL
  Filled 2013-12-05: qty 30

## 2013-12-05 MED ORDER — NITROGLYCERIN 2 % TD OINT
1.0000 [in_us] | TOPICAL_OINTMENT | Freq: Once | TRANSDERMAL | Status: AC
  Administered 2013-12-05: 1 [in_us] via TOPICAL

## 2013-12-05 MED ORDER — SODIUM CHLORIDE 0.9 % IV SOLN: 250.0000 mL | INTRAVENOUS | Status: DC | PRN

## 2013-12-05 MED ORDER — AMLODIPINE BESYLATE 5 MG PO TABS
5.0000 mg | ORAL_TABLET | Freq: Every day | ORAL | Status: DC
  Administered 2013-12-05 – 2013-12-06 (×2): 5 mg via ORAL
  Filled 2013-12-05 (×2): qty 1

## 2013-12-05 MED ORDER — ATORVASTATIN CALCIUM 10 MG PO TABS
10.0000 mg | ORAL_TABLET | Freq: Every day | ORAL | Status: DC
  Administered 2013-12-05 – 2013-12-06 (×2): 10 mg via ORAL
  Filled 2013-12-05 (×3): qty 1

## 2013-12-05 MED ORDER — NITROGLYCERIN 2 % TD OINT
TOPICAL_OINTMENT | TRANSDERMAL | Status: AC
  Filled 2013-12-05: qty 1

## 2013-12-05 MED ORDER — MORPHINE SULFATE 4 MG/ML IJ SOLN
4.0000 mg | Freq: Once | INTRAMUSCULAR | Status: AC
  Administered 2013-12-05: 4 mg via INTRAVENOUS

## 2013-12-05 MED ORDER — ENOXAPARIN SODIUM 80 MG/0.8ML ~~LOC~~ SOLN
80.0000 mg | SUBCUTANEOUS | Status: DC
  Administered 2013-12-05 – 2013-12-07 (×3): 80 mg via SUBCUTANEOUS
  Filled 2013-12-05 (×3): qty 0.8

## 2013-12-05 MED ORDER — IPRATROPIUM-ALBUTEROL 0.5-2.5 (3) MG/3ML IN SOLN
3.0000 mL | Freq: Four times a day (QID) | RESPIRATORY_TRACT | Status: DC
  Administered 2013-12-05 – 2013-12-07 (×7): 3 mL via RESPIRATORY_TRACT
  Filled 2013-12-05 (×9): qty 3

## 2013-12-05 MED ORDER — METHYLPREDNISOLONE SODIUM SUCC 125 MG IJ SOLR
80.0000 mg | Freq: Three times a day (TID) | INTRAMUSCULAR | Status: DC
  Administered 2013-12-05 – 2013-12-07 (×5): 80 mg via INTRAVENOUS
  Filled 2013-12-05 (×5): qty 2

## 2013-12-05 MED ORDER — TRAZODONE HCL 50 MG PO TABS: 100.0000 mg | ORAL_TABLET | Freq: Every evening | ORAL | Status: DC | PRN

## 2013-12-05 MED ORDER — MORPHINE SULFATE 2 MG/ML IJ SOLN
2.0000 mg | Freq: Once | INTRAMUSCULAR | Status: AC
  Administered 2013-12-05: 2 mg via INTRAVENOUS
  Filled 2013-12-05: qty 1

## 2013-12-05 MED ORDER — SODIUM CHLORIDE 0.9 % IJ SOLN
3.0000 mL | Freq: Two times a day (BID) | INTRAMUSCULAR | Status: DC
  Administered 2013-12-05 – 2013-12-06 (×4): 3 mL via INTRAVENOUS

## 2013-12-05 MED ORDER — NITROGLYCERIN 0.4 MG SL SUBL
0.4000 mg | SUBLINGUAL_TABLET | Freq: Once | SUBLINGUAL | Status: DC
  Filled 2013-12-05: qty 1

## 2013-12-05 MED ORDER — MORPHINE SULFATE 4 MG/ML IJ SOLN
INTRAMUSCULAR | Status: AC
  Filled 2013-12-05: qty 1

## 2013-12-05 MED ORDER — MORPHINE SULFATE 2 MG/ML IJ SOLN
2.0000 mg | INTRAMUSCULAR | Status: DC | PRN
  Administered 2013-12-05 – 2013-12-07 (×9): 2 mg via INTRAVENOUS
  Filled 2013-12-05 (×10): qty 1

## 2013-12-05 MED ORDER — ALPRAZOLAM 1 MG PO TABS
1.0000 mg | ORAL_TABLET | Freq: Three times a day (TID) | ORAL | Status: DC | PRN
  Administered 2013-12-05 – 2013-12-07 (×6): 1 mg via ORAL
  Filled 2013-12-05 (×6): qty 1

## 2013-12-05 NOTE — Consult Note (Signed)
CARDIOLOGY CONSULT NOTE   Patient ID: [name] MRN: [id number] DOB/AGE: 02-18-1956 58 y.o.  Admit Date: 12/05/2013 Referring Physician: PTH Primary Physician: [name], MD Consulting Cardiologist: Nona Dell MD Primary Cardiologist: Nona Dell MD Reason for Consultation: CHF, shortness of breath, syncope, chest pain  Clinical Summary [name] is a 58 y.o.male admitted with worsening shortness of breath, lower extremity edema, stating he felt his tongue was also swelling, with increased wheezing and coughing. He also reports a syncopal episode when he stood up to go the bathroom overnight. Patient's wife states that he has been slowly "gaining fluid." He drinks alcohol daily, including on the day of presentation. He tells that he is trying to cut back.     Cardiac markers are negative. D-dimer was negative. He is mildly hypokalemic with a potassium of 3.6 his sodium was 136 with glucose 126. Chest x-ray revealed interstitial edema superimposed on chronic lung disease. In ER he was given sublingual aspirin 324 mg, Lasix 20 mg IV times one potassium 20 mEq, and morphine. The patient was placed on a nitroglycerin patch. The patient states the only thing that controls his pain is the morphine, which has been receiving regularly per the primary team.   He was seen by Dr. Diona Browner in the office in January 2015 with plans for a Holter monitor to be placed in the setting of prior syncopal episode. Patient refused to wear the Holter monitor after being placed on 09/14/2013. Therefore no telemetry strips are available, for further evaluation of syncope .The patient has a known history of nonobstructive CAD with most recent cardiac catheterization January 2015. He was taken off of beta blockers secondary occasional sinus pauses found on telemetry during the hospitalization. The patient had medically managed CAD. Unfortunately continues to smoke. 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      Allergies  Allergen Reactions  . Ativan [Lorazepam] Other (See Comments)    "out of mind"  . Dilaudid [Hydromorphone Hcl] Other (See Comments)    " out of mind"  . Haloperidol Decanoate     Medications Scheduled Medications: . amLODipine  5 mg Oral Daily  . aspirin  81 mg Oral Daily  . atorvastatin  10 mg Oral q1800  . citalopram  40 mg Oral Daily  . enoxaparin (LOVENOX) injection  80 mg Subcutaneous Q24H  . furosemide  20 mg Intravenous Q12H  . hydrOXYzine  25 mg Oral QHS  . ipratropium-albuterol  3 mL Nebulization Q6H  . isosorbide mononitrate  30 mg Oral Daily  . mometasone-formoterol  2 puff Inhalation Q12H  . nitroGLYCERIN  0.4 mg Sublingual Once  . potassium chloride SA  20 mEq Oral Daily  . sodium chloride  3 mL Intravenous Q12H    PRN Medications: sodium chloride, ALPRAZolam, morphine injection, sodium chloride, traZODone   Past Medical History  Diagnosis Date  . COPD (chronic obstructive pulmonary disease)   . Essential hypertension, benign   . Chronic back pain   . Anxiety disorder   . Hyperlipidemia   . Depression   . Obesity   . History of alcohol abuse   . History of cocaine abuse   . Pneumonia   . GERD (gastroesophageal reflux disease)   . Headache(784.0)   . Arthritis   . CAD (coronary artery disease)     a. ~ 2000 cath at [facility];  b. 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 08/2013 Cath: LM anomalous  takeoff from R cor cusp, LAD 30 ost/p/m, LCX 30p, 7840m, OM3 50ost, RI 30p, RCA 40ost, RPL 70d branch, EF 50-55%->Med Rx.    Past Surgical History  Procedure Laterality Date  . Hip surgery      rt  . Cholecystectomy    . Back surgery    . Nasal/sinus endoscopy    . Total hip arthroplasty  05/08/2012    Procedure: TOTAL HIP ARTHROPLASTY;  Surgeon: Nestor LewandowskyFrank J Rowan, MD;  Location: [facility] OR;  Service: Orthopedics;  Laterality: Left;  . Joint replacement      Family History  Problem Relation Age of Onset  . Heart  attack Mother   . Heart attack Brother   . Heart attack Sister     Social History [name] reports that he has been smoking Cigarettes.  He has a 9.75 pack-year smoking history. He does not have any smokeless tobacco history on file. [name] reports that he does not drink alcohol.  Review of Systems Otherwise reviewed and negative except as outlined.  Physical Examination Blood pressure 115/68, pulse 71, temperature 98.3 F (36.8 C), temperature source Oral, resp. rate 24, height 6\' 3"  (1.905 m), weight 345 lb 7.4 oz (156.7 kg), SpO2 92.00%.  Intake/Output Summary (Last 24 hours) at 12/05/13 1231 Last data filed at 12/05/13 1108  Gross per 24 hour  Intake      0 ml  Output    200 ml  Net   -200 ml    Telemetry: NSR  GEN: Morbidly obese, wheezing and coughing. HEENT: Conjunctiva and lids normal, oropharynx clear with moist mucosa. Tongue does not appear to be edematous. It is normal color. Neck: Supple, no elevated JVP or carotid bruits, no thyromegaly. Lungs: Significant inspiratory wheezing, and mild expiratory wheezes, diminished in the bases. Frequent coughing. Cardiac: Regular rate and rhythm, distant, with occasional extrasystole. Abdomen: Soft, nontender, no hepatomegaly, bowel sounds present, no guarding or rebound. Extremities: 1+ edema, distal pulses 2+. Skin: Warm and dry. Musculoskeletal: No kyphosis. Morbidly obese. Neuropsychiatric: Alert and oriented x3, affect grossly appropriate.   Prior Cardiac Testing/Procedures 1. Cardiac Cath 09/11/2013  1. Mild to moderate non-obstructive CAD  2. Preserved LV systolic function  3. Non-cardiac chest pain  2. Echocardiogram:  Left ventricle: The cavity size was normal. Wall thickness was increased in a pattern of moderate LVH. Systolic function was normal. The estimated ejection fraction was in the range of 55% to 60%. Wall motion was normal; there were no regional wall motion abnormalities. Left  ventricular diastolic function parameters were normal   Lab Results  Basic Metabolic Panel:  Recent Labs Lab 12/05/13 0146  NA 136*  K 3.6*  CL 98  CO2 24  GLUCOSE 126*  BUN 15  CREATININE 1.02  CALCIUM 9.1    Liver Function Tests:  Recent Labs Lab 12/05/13 0146  AST 26  ALT 28  ALKPHOS 61  BILITOT 0.4  PROT 7.6  ALBUMIN 3.7    CBC:  Recent Labs Lab 12/05/13 0146 12/05/13 0639  WBC 9.2 8.3  NEUTROABS 5.4  --   HGB 13.2 12.9*  HCT 38.3* 37.2*  MCV 93.6 94.7  PLT 211 193    Cardiac Enzymes:  Recent Labs Lab 12/05/13 0146 12/05/13 0639  TROPONINI <0.30 <0.30    BNP: 21.1   Radiology: Dg Chest Portable 1 View  12/05/2013   CLINICAL DATA:  Shortness of breath, chest pain and lower extremity edema.  EXAM: PORTABLE CHEST - 1 VIEW  COMPARISON:  DG  CHEST 2 VIEW dated 09/11/2013  FINDINGS: There likely is interstitial edema superimposed on chronic lung disease. The heart is mildly enlarged. No focal airspace consolidation or pleural fluid is identified.  IMPRESSION: Interstitial edema superimposed on chronic lung disease.   Electronically Signed   By: Irish Lack M.D.   On: 12/05/2013 02:09     ECG: NSR with poor R-wave progression.   Impression and Recommendations  1. Syncopal Episode: This occurred while he got up out of bed went to the bathroom. Review of home medications have him on amlodipine 5 mg daily, Imdur 30 mg every 24 hours, HydroDIURIL 25 mg. He does not appear dehydrated. We will check orthostatic blood pressures to evaluate for this causing syncope. Agree with telemetry for evaluation of intermittent blocks or pauses.   2. CHF: Chest x-ray did reveal some interstitial edema, but pro BNP was very low and can have some mild elevation in the setting of COPD. He has excessive wheezing  usually on inspiration. He is being treated with albuterol treatments.   3. CAD: Recent cardiac catheterization was completed in January of 2015 revealing  nonobstructive CAD, and is being treated medically. The patient will remain isosorbide, aspirin, and statin therapy. He is now on beta blockers secondary to abnormalities noted during hospitalization in January revealing occasional sinus pauses. Did have anomalous origin of the left main from the right coronary cusp, not entirely clear that this is symptomatic however.  4. COPD: Exacerbation is evident with worsening wheezing and coughing. He denies being exposed to any external irritants to include DEET fertilizer sprays. He unfortunately continues to smoke. Question sleep apnea due to body habitus. Tussi with that has been evaluated in the past per review of records.  5. Anxiety and Depression: Recent death in his family, which he is still recovering from.  6. Chronic Pain: Requires frequent doses of morphine and outpatient by mouth medication. This may be contributing to his syncope with use of narcotics.  Signed: Bettey Mare. Lyman Bishop NP [facility] 12/05/2013, 12:31 PM Co-Sign MD  Attending note:  Patient seen and examined. Reviewed records and modified above note by Ms. Lawrence NP. [name] presents with worsening shortness of breath and reportedly "fluid gain," describing swelling in his legs, increased abdominal girth, even tongue swelling (although not evident on exam). His wife mentions that he drinks alcohol regularly, had "a few beers" on the day of presentation. Recent episode of syncope occurred when he got up to go the bathroom at night time. Otherwise no exertional syncope or reproducible exertional chest pain. He has been coughing a lot is complaining of thoracic, musculoskeletal sounding chest discomfort. Cardiac markers argue against ACS. ECG reviewed and nonspecific with poor R wave progression. Most recent cardiac workup as noted above including cardiac catheterization that showed nonobstructive CAD, anomalous origin of the left main from the right coronary cusp. Legs do  appear somewhat edematous, he has wheezing on examination of the lungs. BNP is normal, could potentially be low in the setting of morbid obesity however. For now would continue medical therapy for CAD, followup telemetry. Agree with IV Lasix. Dr. Clifton James had mentioned considering a cardiac CTA at some point to better define course of left main given an anomalous origin, however patient would be somewhat suboptimal for imaging in light of his morbid obesity - we can likely address this as an outpatient. We will follow with you.  Jonelle Sidle, M.D., F.A.C.C.

## 2013-12-05 NOTE — ED Notes (Signed)
Sherine, RT, in to complete nebulizer.

## 2013-12-05 NOTE — H&P (Signed)
PCP:   [name],[name], MD   Chief Complaint:  Shortness of breath, leg swelling, passed out  HPI:  58 year old male who  has a past medical history of COPD (chronic obstructive pulmonary disease); Essential hypertension, benign; Chronic back pain; Anxiety disorder; Hyperlipidemia; Depression; Obesity; History of alcohol abuse; History of cocaine abuse; Pneumonia; GERD (gastroesophageal reflux disease); Headache(784.0); Arthritis; and CAD (coronary artery disease). Today presents to the hospital with chief complaint of shortness of breath, leg swelling and passing out. Patient was admitted in January with similar complaints at that time patient was seen by cardiology was transferred to [facility] for cardiac catheterizationthat revealed moderate CAD that was managed medically with a 70% distal RCA, 50% OM 3, and normal LV function. 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 A followup cardiac monitor was arranged at discharge. Patient also was evaluated for syncope, and was discharged on Holter monitor. Patient is doing fine yesterday when he started having shortness of breath, chest pain located on the left side and radiating to the left and jaw. He also complains of worsening leg swelling and swelling in the arms, tongue swelling which she usually gets with fluid overload (patient says that he was told by cardiologist, that it could be due to congestive heart failure.) Patient also reports that he got up this morning at 12:30 AM to go the bathroom and passed out. No seizure activity was noted by his wife. Patient has had 4 episodes of vomiting since yesterday   Allergies:   Allergies  Allergen Reactions  . Ativan [Lorazepam] Other (See Comments)    "out of mind"  . Dilaudid [Hydromorphone Hcl] Other (See Comments)    " out of mind"  . Haloperidol Decanoate       Past Medical History  Diagnosis Date  . COPD (chronic obstructive pulmonary  disease)   . Essential hypertension, benign   . Chronic back pain   . Anxiety disorder   . Hyperlipidemia   . Depression   . Obesity   . History of alcohol abuse   . History of cocaine abuse   . Pneumonia   . GERD (gastroesophageal reflux disease)   . Headache(784.0)   . Arthritis   . CAD (coronary artery disease)     a. ~ 2000 cath at [facility];  b. 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 08/2013 Cath: LM anomalous takeoff from R cor cusp, LAD 30 ost/p/m, LCX 30p, 66m OM3 50ost, RI 30p, RCA 40ost, RPL 70d branch, EF 50-55%->Med Rx.    Past Surgical History  Procedure Laterality Date  . Hip surgery      rt  . Cholecystectomy    . Back surgery    . Nasal/sinus endoscopy    . Total hip arthroplasty  05/08/2012    Procedure: TOTAL HIP ARTHROPLASTY;  Surgeon: FKerin Salen MD;  [facility]  Service: Orthopedics;  Laterality: Left;  . Joint replacement      Prior to Admission medications   Medication Sig Start Date End Date Taking? Authorizing Provider  ALPRAZolam (Duanne Moron 1 MG tablet Take 1 tablet (1 mg total) by mouth 3 (three) times daily as needed for anxiety. For anxiety 05/15/12  Yes VHosie Poisson MD  amLODipine (NORVASC) 5 MG tablet Take 5 mg by mouth daily.     Yes Historical Provider, MD  aspirin 81 MG tablet Take 1 tablet (81 mg total) by mouth daily. 05/31/12  Yes VHosie Poisson  MD  atorvastatin (LIPITOR) 10 MG tablet Take 1 tablet (10 mg total) by mouth daily at 6 PM. 09/14/13  Yes Erlene Quan, PA-C  budesonide-formoterol (SYMBICORT) 160-4.5 MCG/ACT inhaler Inhale 2 puffs into the lungs 2 (two) times daily.   Yes Historical Provider, MD  citalopram (CELEXA) 40 MG tablet Take 40 mg by mouth daily.     Yes Historical Provider, MD  hydrochlorothiazide (HYDRODIURIL) 25 MG tablet Take 1 tablet by mouth daily. 09/05/13  Yes Historical Provider, MD  hydrOXYzine (VISTARIL) 25 MG capsule Take 25 mg by mouth at bedtime. For itching   Yes Historical Provider, MD  ipratropium-albuterol  (DUONEB) 0.5-2.5 (3) MG/3ML SOLN Take 3 mLs by nebulization 2 (two) times daily.   Yes Historical Provider, MD  isosorbide mononitrate (IMDUR) 30 MG 24 hr tablet Take 30 mg by mouth daily.     Yes Historical Provider, MD  meloxicam (MOBIC) 15 MG tablet Take 1 tablet by mouth daily. 08/13/13  Yes Historical Provider, MD  nitroGLYCERIN (NITROSTAT) 0.4 MG SL tablet Place 0.4 mg under the tongue every 5 (five) minutes as needed. For chest pain   Yes Historical Provider, MD  omeprazole (PRILOSEC) 20 MG capsule Take 20 mg by mouth daily.   Yes Historical Provider, MD  oxyCODONE-acetaminophen (PERCOCET) 10-325 MG per tablet Take 1 tablet by mouth every 8 (eight) hours as needed for pain. 05/22/12  Yes Nat Christen, MD  potassium chloride SA (K-DUR,KLOR-CON) 20 MEQ tablet Take 1 tablet by mouth daily. 08/13/13  Yes Historical Provider, MD  PROAIR HFA 108 (90 BASE) MCG/ACT inhaler  06/20/13  Yes Historical Provider, MD  traZODone (DESYREL) 100 MG tablet Take 100 mg by mouth at bedtime as needed for sleep.    Yes Historical Provider, MD  vitamin C (ASCORBIC ACID) 500 MG tablet Take 500 mg by mouth daily.   Yes Historical Provider, MD    Social History:  reports that he has been smoking Cigarettes.  He has a 9.75 pack-year smoking history. He does not have any smokeless tobacco history on file. He reports that he does not drink alcohol or use illicit drugs.  Family History  Problem Relation Age of Onset  . Heart attack Mother   . Heart attack Brother   . Heart attack Sister      All the positives are listed in BOLD  Review of Systems:  HEENT: Headache, blurred vision, runny nose, sore throat Neck: Hypothyroidism, hyperthyroidism,,lymphadenopathy Chest : Shortness of breath, history of COPD, Asthma Heart : Chest pain, history of coronary arterey disease GI:  Nausea, vomiting, diarrhea, constipation, GERD GU: Dysuria, urgency, frequency of urination, hematuria Neuro: Stroke, seizures, syncope Psych:  Depression, anxiety, hallucinations   Physical Exam: Blood pressure 132/71, pulse 88, temperature 99.5 F (37.5 C), temperature source Oral, resp. rate 24, height '6\' 3"'  (1.905 m), weight 161.169 kg (355 lb 5 oz), SpO2 97.00%. Constitutional:   Patient is a well-developed and well-nourished male* in no acute distress and cooperative with exam. Head: Normocephalic and atraumatic Mouth: Mucus membranes moist, thick tongue Eyes: PERRL, EOMI, conjunctivae normal Neck: Supple, No Thyromegaly Cardiovascular: RRR, S1 normal, S2 normal Pulmonary/Chest: Bilateral rhonchi Abdominal: Soft. Non-tender, non-distended, bowel sounds are normal, no masses, organomegaly, or guarding present.  Neurological: A&O x3, Strenght is normal and symmetric bilaterally, cranial nerve II-XII are grossly intact, no focal motor deficit, sensory intact to light touch bilaterally.  Extremities : Bilateral 1+ pitting edema in the lower extremities   Labs on Admission:  Results for orders  placed during the hospital encounter of 12/05/13 (from the past 48 hour(s))  TROPONIN I     Status: None   Collection Time    12/05/13  1:46 AM      Result Value Ref Range   Troponin I <0.30  <0.30 ng/mL   Comment:            Due to the release kinetics of cTnI,     a negative result within the first hours     of the onset of symptoms does not rule out     myocardial infarction with certainty.     If myocardial infarction is still suspected,     repeat the test at appropriate intervals.  COMPREHENSIVE METABOLIC PANEL     Status: Abnormal   Collection Time    12/05/13  1:46 AM      Result Value Ref Range   Sodium 136 (*) 137 - 147 mEq/L   Potassium 3.6 (*) 3.7 - 5.3 mEq/L   Chloride 98  96 - 112 mEq/L   CO2 24  19 - 32 mEq/L   Glucose, Bld 126 (*) 70 - 99 mg/dL   BUN 15  6 - 23 mg/dL   Creatinine, Ser 1.02  0.50 - 1.35 mg/dL   Calcium 9.1  8.4 - 10.5 mg/dL   Total Protein 7.6  6.0 - 8.3 g/dL   Albumin 3.7  3.5 - 5.2 g/dL    AST 26  0 - 37 U/L   ALT 28  0 - 53 U/L   Alkaline Phosphatase 61  39 - 117 U/L   Total Bilirubin 0.4  0.3 - 1.2 mg/dL   GFR calc non Af Amer 81 (*) >90 mL/min   GFR calc Af Amer >90  >90 mL/min   Comment: (NOTE)     The eGFR has been calculated using the CKD EPI equation.     This calculation has not been validated in all clinical situations.     eGFR's persistently <90 mL/min signify possible Chronic Kidney     Disease.  CBC WITH DIFFERENTIAL     Status: Abnormal   Collection Time    12/05/13  1:46 AM      Result Value Ref Range   WBC 9.2  4.0 - 10.5 K/uL   RBC 4.09 (*) 4.22 - 5.81 MIL/uL   Hemoglobin 13.2  13.0 - 17.0 g/dL   HCT 38.3 (*) 39.0 - 52.0 %   MCV 93.6  78.0 - 100.0 fL   MCH 32.3  26.0 - 34.0 pg   MCHC 34.5  30.0 - 36.0 g/dL   RDW 12.7  11.5 - 15.5 %   Platelets 211  150 - 400 K/uL   Neutrophils Relative % 59  43 - 77 %   Lymphocytes Relative 21  12 - 46 %   Monocytes Relative 14 (*) 3 - 12 %   Eosinophils Relative 5  0 - 5 %   Basophils Relative 1  0 - 1 %   Neutro Abs 5.4  1.7 - 7.7 K/uL   Lymphs Abs 1.9  0.7 - 4.0 K/uL   Monocytes Absolute 1.3 (*) 0.1 - 1.0 K/uL   Eosinophils Absolute 0.5  0.0 - 0.7 K/uL   Basophils Absolute 0.1  0.0 - 0.1 K/uL   WBC Morphology ATYPICAL LYMPHOCYTES     Smear Review LARGE PLATELETS PRESENT    PRO B NATRIURETIC PEPTIDE     Status: None   Collection Time  12/05/13  1:46 AM      Result Value Ref Range   Pro B Natriuretic peptide (BNP) 21.1  0 - 125 pg/mL  D-DIMER, QUANTITATIVE     Status: None   Collection Time    12/05/13  3:56 AM      Result Value Ref Range   D-Dimer, Quant 0.40  0.00 - 0.48 ug/mL-FEU   Comment:            AT THE INHOUSE ESTABLISHED CUTOFF     VALUE OF 0.48 ug/mL FEU,     THIS ASSAY HAS BEEN DOCUMENTED     IN THE LITERATURE TO HAVE     A SENSITIVITY AND NEGATIVE     PREDICTIVE VALUE OF AT LEAST     98 TO 99%.  THE TEST RESULT     SHOULD BE CORRELATED WITH     AN ASSESSMENT OF THE CLINICAL      PROBABILITY OF DVT / VTE.    Radiological Exams on Admission: Dg Chest Portable 1 View  12/05/2013   CLINICAL DATA:  Shortness of breath, chest pain and lower extremity edema.  EXAM: PORTABLE CHEST - 1 VIEW  COMPARISON:  DG CHEST 2 VIEW dated 09/11/2013  FINDINGS: There likely is interstitial edema superimposed on chronic lung disease. The heart is mildly enlarged. No focal airspace consolidation or pleural fluid is identified.  IMPRESSION: Interstitial edema superimposed on chronic lung disease.   Electronically Signed   By: Aletta Edouard M.D.   On: 12/05/2013 02:09    Assessment/Plan Active Problems:   Essential hypertension, benign   Coronary atherosclerosis of native coronary artery   Syncope   Chest pain   CHF exacerbation  Dyspnea Likely multifactorial due to CHF exacerbation as well as COPD. Patient received 1 dose of Lasix in the ED with good response. We'll continue with Lasix 20 mg IV every 12 hours. Also DuoNeb nebulizers every 6 hours.  Syncope Second episode of syncope since January, patient has been followed by cardiology as outpatient. We'll order the patient on telemetry, cycle cardiac enzymes and obtain cardiology consultation in a.m.  Chest pain Patient had recent cardiac cath, which revealed moderate CAD managed medically. Continue Imdur, aspirin, Lipitor    Code status: Patient is full code  Family discussion: No family at bedside   Time Spent on Admission: 60 minutes  [facility] Pager: [phone] 12/05/2013, 5:04 AM  If 7PM-7AM, please contact night-coverage  [email or web address]  Password [email or web address]

## 2013-12-05 NOTE — ED Provider Notes (Signed)
CSN: [id number]     Arrival date & time 12/05/13  0132 History   First MD Initiated Contact with Patient 12/05/13 0139     Chief Complaint  Patient presents with  . Shortness of Breath  . Chest Pain  . Leg Swelling     (Consider location/radiation/quality/duration/timing/severity/associated sxs/prior Treatment) HPI  This a 58 year old male with history of coronary artery disease, COPD, hyperlipidemia, alcohol abuse who presents with chest pain, shortness of breath, lower extremity swelling, and syncope. Patient states symptoms worsened yesterday. He reports swelling in his hands and his feet. He denies any new changes in medication. Patient reports pressure-like chest pain that radiates to the right side with associated shortness of breath. He states he has had similar symptoms in the past and was admitted in January for the same. Currently his pain is 9/10. Nothing makes it better or worse. He did take an 81 mg aspirin today. Patient also reports that he got up this morning at 12:30 AM to go the bathroom and passed out. No seizure activity was noted by his wife.  Of note, have reviewed the patient's chart. He had a similar presentation in January and was admitted. At that time he had a cardiac catheterization that showed nonobstructive coronary artery disease which is medically managed. Echo showed EF of 55-60%. He is followed up with Dr. Lilian KapurMcDonald.  Past Medical History  Diagnosis Date  . COPD (chronic obstructive pulmonary disease)   . Essential hypertension, benign   . Chronic back pain   . Anxiety disorder   . Hyperlipidemia   . Depression   . Obesity   . History of alcohol abuse   . History of cocaine abuse   . Pneumonia   . GERD (gastroesophageal reflux disease)   . Headache(784.0)   . Arthritis   . CAD (coronary artery disease)     a. ~ 2000 cath at [facility];  b. 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   Past Surgical History  Procedure Laterality Date  . Hip surgery      rt  . Cholecystectomy    . Back surgery    . Nasal/sinus endoscopy    . Total hip arthroplasty  05/08/2012    Procedure: TOTAL HIP ARTHROPLASTY;  Surgeon: Nestor LewandowskyFrank J Rowan, MD;  Location: [facility] OR;  Service: Orthopedics;  Laterality: Left;  . Joint replacement     Family History  Problem Relation Age of Onset  . Heart attack Mother   . Heart attack Brother   . Heart attack Sister    History  Substance Use Topics  . Smoking status: Current Every Day Smoker -- 0.25 packs/day for 39 years    Types: Cigarettes  . Smokeless tobacco: Not on file  . Alcohol Use: No     Comment: denies    Review of Systems  Constitutional: Negative.  Negative for fever.  Respiratory: Positive for chest tightness, shortness of breath and wheezing.   Cardiovascular: Positive for chest pain and leg swelling. Negative for palpitations.  Gastrointestinal: Negative.  Negative for vomiting, abdominal pain and diarrhea.  Genitourinary: Negative.  Negative for dysuria.  Musculoskeletal: Positive for back pain.  Neurological: Positive for dizziness and syncope. Negative for weakness, numbness and headaches.  All other systems reviewed and are negative.     Allergies  Ativan; Dilaudid; and Haloperidol  decanoate  Home Medications   Prior to Admission medications   Medication Sig Start Date End Date Taking? Authorizing Provider  ALPRAZolam Prudy Feeler) 1 MG tablet Take 1 tablet (1 mg total) by mouth 3 (three) times daily as needed for anxiety. For anxiety 05/15/12  Yes Kathlen Mody, MD  amLODipine (NORVASC) 5 MG tablet Take 5 mg by mouth daily.     Yes Historical Provider, MD  aspirin 81 MG tablet Take 1 tablet (81 mg total) by mouth daily. 05/31/12  Yes Kathlen Mody, MD  atorvastatin (LIPITOR) 10 MG tablet Take 1 tablet (10 mg total) by mouth daily at 6 PM. 09/14/13  Yes Abelino [name], PA-C  budesonide-formoterol (SYMBICORT) 160-4.5 MCG/ACT inhaler Inhale 2 puffs into the lungs 2 (two) times daily.   Yes Historical Provider, MD  citalopram (CELEXA) 40 MG tablet Take 40 mg by mouth daily.     Yes Historical Provider, MD  hydrochlorothiazide (HYDRODIURIL) 25 MG tablet Take 1 tablet by mouth daily. 09/05/13  Yes Historical Provider, MD  hydrOXYzine (VISTARIL) 25 MG capsule Take 25 mg by mouth at bedtime. For itching   Yes Historical Provider, MD  ipratropium-albuterol (DUONEB) 0.5-2.5 (3) MG/3ML SOLN Take 3 mLs by nebulization 2 (two) times daily.   Yes Historical Provider, MD  isosorbide mononitrate (IMDUR) 30 MG 24 hr tablet Take 30 mg by mouth daily.     Yes Historical Provider, MD  meloxicam (MOBIC) 15 MG tablet Take 1 tablet by mouth daily. 08/13/13  Yes Historical Provider, MD  nitroGLYCERIN (NITROSTAT) 0.4 MG SL tablet Place 0.4 mg under the tongue every 5 (five) minutes as needed. For chest pain   Yes Historical Provider, MD  omeprazole (PRILOSEC) 20 MG capsule Take 20 mg by mouth daily.   Yes Historical Provider, MD  oxyCODONE-acetaminophen (PERCOCET) 10-325 MG per tablet Take 1 tablet by mouth every 8 (eight) hours as needed for pain. 05/22/12  Yes Donnetta Hutching, MD  potassium chloride SA (K-DUR,KLOR-CON) 20 MEQ tablet Take 1 tablet by mouth daily. 08/13/13  Yes Historical Provider, MD  PROAIR HFA 108 (90 BASE) MCG/ACT inhaler  06/20/13  Yes Historical Provider, MD  traZODone (DESYREL) 100 MG tablet Take 100 mg by mouth at bedtime as needed for sleep.    Yes Historical Provider, MD  vitamin C (ASCORBIC ACID) 500 MG tablet Take 500 mg by mouth daily.   Yes Historical Provider, MD   BP 127/58  Pulse 88  Temp(Src) 99.5 F (37.5 C) (Oral)  Resp 24  Ht 6\' 3"  (1.905 m)  Wt 355 lb 5 oz (161.169 kg)  BMI 44.41 kg/m2  SpO2 97% Physical Exam  Nursing note and vitals reviewed. Constitutional: He is oriented to person, place, and time.  Obese, no acute distress  HENT:   Head: Normocephalic and atraumatic.  Mouth/Throat: Oropharynx is clear and moist.  Eyes: Pupils are equal, round, and reactive to light.  Neck: Neck supple. No JVD present.  Cardiovascular: Normal rate, regular rhythm and normal heart sounds.   No murmur heard. Pulmonary/Chest: Effort normal. No respiratory distress. He has wheezes. He has no rales. He exhibits no tenderness.  Abdominal: Soft. There is no tenderness.  Musculoskeletal: He exhibits edema.  1+ bilateral lower extremity edema  Neurological: He is alert and oriented to person, place, and time.  Skin: Skin is warm and dry.  Multiple tattoos  Psychiatric: He has a normal mood and affect.    ED Course  Procedures (including critical care time) Labs Review Labs Reviewed  COMPREHENSIVE METABOLIC PANEL - Abnormal; Notable for the following:    Sodium 136 (*)    Potassium 3.6 (*)    Glucose, Bld 126 (*)    GFR calc non Af Amer 81 (*)    All other components within normal limits  CBC WITH DIFFERENTIAL - Abnormal; Notable for the following:    RBC 4.09 (*)    HCT 38.3 (*)    Monocytes Relative 14 (*)    Monocytes Absolute 1.3 (*)    All other components within normal limits  TROPONIN I  PRO B NATRIURETIC PEPTIDE  D-DIMER, QUANTITATIVE    Imaging Review Dg Chest Portable 1 View  12/05/2013   CLINICAL DATA:  Shortness of breath, chest pain and lower extremity edema.  EXAM: PORTABLE CHEST - 1 VIEW  COMPARISON:  DG CHEST 2 VIEW dated 09/11/2013  FINDINGS: There likely is interstitial edema superimposed on chronic lung disease. The heart is mildly enlarged. No focal airspace consolidation or pleural fluid is identified.  IMPRESSION: Interstitial edema superimposed on chronic lung disease.   Electronically Signed   By: Irish Lack M.D.   On: 12/05/2013 02:09     EKG Interpretation   Date/Time:  Wednesday December 05 2013 01:40:08 EDT Ventricular Rate:  85 PR Interval:  190 QRS Duration: 100 QT Interval:  372 QTC  Calculation: 442 R Axis:   13 Text Interpretation:  Normal sinus rhythm Cannot rule out Anterior infarct  , age undetermined Abnormal ECG When compared with ECG of 12-Sep-2013  11:36, No significant change was found Confirmed by Wilkie Aye  MD, Rhesa Forsberg  [phone]) on 12/05/2013 3:06:17 AM      Medications  nitroGLYCERIN (NITROSTAT) SL tablet 0.4 mg (0.4 mg Sublingual Not Given 12/05/13 0228)  ipratropium-albuterol (DUONEB) 0.5-2.5 (3) MG/3ML nebulizer solution 3 mL (3 mLs Nebulization Given 12/05/13 0207)  aspirin chewable tablet 324 mg (324 mg Oral Given 12/05/13 0224)  nitroGLYCERIN (NITROGLYN) 2 % ointment 1 inch (1 inch Topical Given 12/05/13 0235)  morphine 4 MG/ML injection 4 mg (4 mg Intravenous Given 12/05/13 0237)  furosemide (LASIX) injection 20 mg (20 mg Intravenous Given 12/05/13 0324)  potassium chloride 20 MEQ/15ML (10%) liquid 20 mEq (20 mEq Oral Given 12/05/13 0324)  morphine 4 MG/ML injection 4 mg (4 mg Intravenous Given 12/05/13 0400)    MDM   Final diagnoses:  Chest pain  Syncope  Pulmonary edema    Patient presents with chest pain, shortness of breath, and syncope. He is nontoxic on exam.  Patient was noted to have wheezing on exam with lower extremity edema, concerning for volume overloaded. Basic labwork was obtained.  Troponin negative,.are negative, CBC and CMP are reassuring.  BNP 21. Chest x-ray shows evidence of interstitial edema. Patient was given DuoNeb. He was given aspirin, nitroglycerin paste, and morphine for chest pain. Patient states that the morphine take his pain away completely but that it comes back. Patient was given Lasix 20 mg IV.   Given recent cath with known nonobstructive coronary artery disease, will admit patient for chest pain rule out and cardiology evaluation tomorrow.    Shon Baton, MD 12/05/13 [phone]

## 2013-12-05 NOTE — Care Management Note (Addendum)
    Page 1 of 1   12/05/2013     4:54:35 PM   CARE MANAGEMENT NOTE 12/05/2013  Patient:  [name], [name]   Account Number:  [id number]  Date Initiated:  12/05/2013  Documentation initiated by:  Rosemary Holms  Subjective/Objective Assessment:   Pt admitted from home with his wife that has cancer. He states that he does not need a HH RN at this time. [facility] would benefit pt but his PCP is not participating in the program.     Action/Plan:   Anticipated DC Date:     Anticipated DC Plan:  HOME/SELF CARE      DC Planning Services  CM consult      Choice offered to / List presented to:             Status of service:  Completed, signed off Medicare Important Message given?   (If response is "NO", the following Medicare IM given date fields will be blank) Date Medicare IM given:   Date Additional Medicare IM given:    Discharge Disposition:    Per UR Regulation:    If discussed at Long Length of Stay Meetings, dates discussed:    Comments:  12/05/13 Rosemary Holms RN BSN CM

## 2013-12-05 NOTE — Care Management Utilization Note (Signed)
UR completed 

## 2013-12-05 NOTE — Progress Notes (Signed)
PROGRESS NOTE  [name] PFX:[id number] DOB: Jun 24, 1956 DOA: 12/05/2013 PCP: [name] Deiters, MD  Assessment/Plan: Acute hypoxemic respiratory failure -Secondary to CHF decompensation and COPD exacerbation -Continue supplemental oxygen -goal SaO2>92% COPD exacerbation -Patient has approximately 80-pack-year history -Continues to smoke -Start IV Solu-Medrol -Continue albuterol and Atrovent aerosolized Acute diastolic CHF -09/13/2013 echocardiogram shows EF 55-60% -Clinically hypervolemic -09/13/2013 weight 149.2 kg -12/05/2013 weight 156.7 kg -Daily weights -Fluid restriction -Continue IV furosemide -Appreciate cardiology input Syncope -Orthostatics negative -Etiology unclear -Patient refuses Holter monitor Atypical chest pain -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 -Continue aspirin 81 mg daily -Continue Imdur and Lipitor -Troponins negative x3 Chronic pain -Patient takes Percocet 10/325 every 8 hours -Adjust morphine to 2 mg every 3 hours when necessary pain which is equivalent to oxycodone 30 mg daily po -Have discussed with the patient I will not manage his chronic pain at d/c nor  provide a prescription at the time of discharge Leg pain and edema -Venous duplex rule out DVT    Family Communication:   Pt at beside Disposition Plan:   Home when medically stable       Procedures/Studies: Dg Chest Portable 1 View  12/05/2013   CLINICAL DATA:  Shortness of breath, chest pain and lower extremity edema.  EXAM: PORTABLE CHEST - 1 VIEW  COMPARISON:  DG CHEST 2 VIEW dated 09/11/2013  FINDINGS: There likely is interstitial edema superimposed on chronic lung disease. The heart is mildly enlarged. No focal airspace consolidation or pleural fluid is identified.  IMPRESSION: Interstitial edema superimposed on chronic lung disease.   Electronically Signed   By: Irish Lack M.D.   On: 12/05/2013 02:09         Subjective:  patient  continues to complain of chest pain or shortness of breath. He states the shortness of breath is no better. He denies any vomiting, diarrhea, abdominal pain, dysuria, hematuria, headache, dizziness.  Objective: Filed Vitals:   12/05/13 1125 12/05/13 1130 12/05/13 1332 12/05/13 1435  BP: 121/76 115/68 117/73   Pulse: 80 71 78   Temp:   98 F (36.7 C)   TempSrc:   Oral   Resp: 24 24 22    Height:      Weight:      SpO2: 92% 92% 93% 89%    Intake/Output Summary (Last 24 hours) at 12/05/13 1811 Last data filed at 12/05/13 1300  Gross per 24 hour  Intake    240 ml  Output    200 ml  Net     40 ml   Weight change:  Exam:   General:  Pt is alert, follows commands appropriately, not in acute distress  HEENT: No icterus, No thrush, No neck mass, [place]/AT  Cardiovascular: RRR, S1/S2, no rubs, no gallops  Respiratory: bilateral expiratory wheeze. Scattered rales.   Abdomen: Soft/+BS, non tender, non distended, no guarding  Extremities: 2+ LEedema, No lymphangitis, No petechiae, No rashes, no synovitis  Data Reviewed: Basic Metabolic Panel:  Recent Labs Lab 12/05/13 0146  NA 136*  K 3.6*  CL 98  CO2 24  GLUCOSE 126*  BUN 15  CREATININE 1.02  CALCIUM 9.1   Liver Function Tests:  Recent Labs Lab 12/05/13 0146  AST 26  ALT 28  ALKPHOS 61  BILITOT 0.4  PROT 7.6  ALBUMIN 3.7   No results found for this basename: LIPASE, AMYLASE,  in the last 168 hours No results found for this basename: AMMONIA,  in the last 168 hours CBC:  Recent Labs Lab 12/05/13 0146 12/05/13 0639  WBC 9.2 8.3  NEUTROABS 5.4  --   HGB 13.2 12.9*  HCT 38.3* 37.2*  MCV 93.6 94.7  PLT 211 193   Cardiac Enzymes:  Recent Labs Lab 12/05/13 0146 12/05/13 0639 12/05/13 1202  TROPONINI <0.30 <0.30 <0.30   BNP: No components found with this basename: POCBNP,  CBG: No results found for this basename: GLUCAP,  in the last 168 hours  No results found for this or any previous visit (from  the past 240 hour(s)).   Scheduled Meds: . amLODipine  5 mg Oral Daily  . aspirin  81 mg Oral Daily  . atorvastatin  10 mg Oral q1800  . citalopram  40 mg Oral Daily  . enoxaparin (LOVENOX) injection  80 mg Subcutaneous Q24H  . furosemide  20 mg Intravenous Q12H  . hydrOXYzine  25 mg Oral QHS  . ipratropium-albuterol  3 mL Nebulization Q6H  . isosorbide mononitrate  30 mg Oral Daily  . methylPREDNISolone (SOLU-MEDROL) injection  80 mg Intravenous 3 times per day  . mometasone-formoterol  2 puff Inhalation Q12H  . nitroGLYCERIN  0.4 mg Sublingual Once  . potassium chloride SA  20 mEq Oral Daily  . sodium chloride  3 mL Intravenous Q12H   Continuous Infusions:    [name] Hartshornavid Zander Ingham, DO  [facility] Pager [phone]  If 7PM-7AM, please contact night-coverage [email or web address] Password [email or web address] 12/05/2013, 6:11 PM   LOS: 0 days

## 2013-12-05 NOTE — ED Notes (Signed)
Pt arrived by EMS, pt states started being SOB yesterday, chest pain shooting to arm & pt states he is swelling feet legs & tongue.

## 2013-12-06 ENCOUNTER — Inpatient Hospital Stay (HOSPITAL_COMMUNITY): Payer: Medicare Other

## 2013-12-06 DIAGNOSIS — J441 Chronic obstructive pulmonary disease with (acute) exacerbation: Secondary | ICD-10-CM

## 2013-12-06 LAB — COMPREHENSIVE METABOLIC PANEL
ALT: 28 U/L (ref 0–53)
AST: 24 U/L (ref 0–37)
Albumin: 3.7 g/dL (ref 3.5–5.2)
Alkaline Phosphatase: 61 U/L (ref 39–117)
BUN: 16 mg/dL (ref 6–23)
CALCIUM: 9.3 mg/dL (ref 8.4–10.5)
CO2: 26 mEq/L (ref 19–32)
Chloride: 97 mEq/L (ref 96–112)
Creatinine, Ser: 0.87 mg/dL (ref 0.50–1.35)
GFR calc non Af Amer: >90 mL/min (ref >90)
GLUCOSE: 222 mg/dL — AB (ref 70–99)
Potassium: 3.9 mEq/L (ref 3.7–5.3)
Sodium: 136 mEq/L — ABNORMAL LOW (ref 137–147)
TOTAL PROTEIN: 7.9 g/dL (ref 6.0–8.3)
Total Bilirubin: 0.3 mg/dL (ref 0.3–1.2)

## 2013-12-06 LAB — CBC
HCT: 39.3 % (ref 39.0–52.0)
HEMOGLOBIN: 13.4 g/dL (ref 13.0–17.0)
MCH: 32.2 pg (ref 26.0–34.0)
MCHC: 34.1 g/dL (ref 30.0–36.0)
MCV: 94.5 fL (ref 78.0–100.0)
PLATELETS: 236 10*3/uL (ref 150–400)
RBC: 4.16 MIL/uL — ABNORMAL LOW (ref 4.22–5.81)
RDW: 12.6 % (ref 11.5–15.5)
WBC: 5.9 10*3/uL (ref 4.0–10.5)

## 2013-12-06 LAB — MAGNESIUM: Magnesium: 2 mg/dL (ref 1.5–2.5)

## 2013-12-06 MED ORDER — FUROSEMIDE 40 MG PO TABS: 40.0000 mg | ORAL_TABLET | Freq: Every day | ORAL | Status: DC

## 2013-12-06 MED ORDER — FUROSEMIDE 10 MG/ML IJ SOLN
20.0000 mg | Freq: Two times a day (BID) | INTRAMUSCULAR | Status: DC
Start: 2013-12-06 — End: 2013-12-07
  Administered 2013-12-06 – 2013-12-07 (×3): 20 mg via INTRAVENOUS
  Filled 2013-12-06 (×3): qty 2

## 2013-12-06 MED ORDER — FUROSEMIDE 20 MG PO TABS: 20.0000 mg | ORAL_TABLET | Freq: Every day | ORAL | Status: DC

## 2013-12-06 MED ORDER — PREDNISONE 20 MG PO TABS: 60.0000 mg | ORAL_TABLET | Freq: Two times a day (BID) | ORAL | Status: DC

## 2013-12-06 MED ORDER — GUAIFENESIN-DM 100-10 MG/5ML PO SYRP
15.0000 mL | ORAL_SOLUTION | ORAL | Status: DC | PRN
  Administered 2013-12-06: 15 mL via ORAL
  Filled 2013-12-06: qty 15

## 2013-12-06 MED ORDER — BENZONATATE 100 MG PO CAPS
200.0000 mg | ORAL_CAPSULE | Freq: Three times a day (TID) | ORAL | Status: DC
  Administered 2013-12-06: 200 mg via ORAL
  Filled 2013-12-06 (×2): qty 2

## 2013-12-06 MED ORDER — ISOSORBIDE MONONITRATE ER 30 MG PO TB24: 30.0000 mg | ORAL_TABLET | Freq: Every day | ORAL | Status: DC

## 2013-12-06 NOTE — Progress Notes (Signed)
Patient having a coughing spell this evening,medication that was given for cough not effective,Dr, [facility]T notified. Will continue to monitor patient.

## 2013-12-06 NOTE — Progress Notes (Signed)
Consulting cardiologist: Nona DellMcDowell, Samuel MD Primary Cardiologist: Nona DellMcDowell, Samuel MD  Subjective:   "I felt like I passed out this morning." Still having a lot of chronic pain. Breathing status is worse-subjectively.     Objective:   Temp:  [97.5 F (36.4 C)-98.4 F (36.9 C)] 98.4 F (36.9 C) (04/16 0956) Pulse Rate:  [71-83] 82 (04/16 0956) Resp:  [20-24] 20 (04/16 0956) BP: (113-136)/(53-77) 122/76 mmHg (04/16 0956) SpO2:  [89 %-97 %] 96 % (04/16 0956) Weight:  [350 lb 15.6 oz (159.2 kg)] 350 lb 15.6 oz (159.2 kg) (04/16 0611) Last BM Date: 12/03/13  Filed Weights   12/05/13 0138 12/05/13 0554 12/06/13 [id number]  Weight: 355 lb 5 oz (161.169 kg) 345 lb 7.4 oz (156.7 kg) 350 lb 15.6 oz (159.2 kg)    Intake/Output Summary (Last 24 hours) at 12/06/13 1014 Last data filed at 12/06/13 0954  Gross per 24 hour  Intake    480 ml  Output   2610 ml  Net  -2130 ml    Telemetry:  NSR with PAC's.   Exam:  General: No acute distress.  Lungs: Expiratory wheezes. Coughing non-productively.  Cardiac: No elevated JVP or bruits. RRR, no gallop or rub.   Abdomen: Normoactive bowel sounds, nontender, nondistended.  Extremities: No pitting edema, distal pulses full.  Neuropsychiatric: Alert and oriented x3, affect appropriate. Multiple somatic complaints.   Echocardiogram:  Lab Results:  Basic Metabolic Panel:  Recent Labs Lab 12/05/13 0146 12/06/13 0606  NA 136* 136*  K 3.6* 3.9  CL 98 97  CO2 24 26  GLUCOSE 126* 222*  BUN 15 16  CREATININE 1.02 0.87  CALCIUM 9.1 9.3  MG  --  2.0    Liver Function Tests:  Recent Labs Lab 12/05/13 0146 12/06/13 0606  AST 26 24  ALT 28 28  ALKPHOS 61 61  BILITOT 0.4 0.3  PROT 7.6 7.9  ALBUMIN 3.7 3.7    CBC:  Recent Labs Lab 12/05/13 0146 12/05/13 0639 12/06/13 0606  WBC 9.2 8.3 5.9  HGB 13.2 12.9* 13.4  HCT 38.3* 37.2* 39.3  MCV 93.6 94.7 94.5  PLT 211 193 236    Cardiac Enzymes:  Recent Labs Lab  12/05/13 0639 12/05/13 1202 12/05/13 1824  TROPONINI <0.30 <0.30 <0.30    BNP:  Recent Labs  12/05/13 0146  PROBNP 21.1    Radiology: Dg Chest Portable 1 View  12/05/2013   CLINICAL DATA:  Shortness of breath, chest pain and lower extremity edema.  EXAM: PORTABLE CHEST - 1 VIEW  COMPARISON:  DG CHEST 2 VIEW dated 09/11/2013  FINDINGS: There likely is interstitial edema superimposed on chronic lung disease. The heart is mildly enlarged. No focal airspace consolidation or pleural fluid is identified.  IMPRESSION: Interstitial edema superimposed on chronic lung disease.   Electronically Signed   By: Irish LackGlenn  Yamagata M.D.   On: 12/05/2013 02:09     Medications:   Scheduled Medications: . amLODipine  5 mg Oral Daily  . aspirin  81 mg Oral Daily  . atorvastatin  10 mg Oral q1800  . citalopram  40 mg Oral Daily  . enoxaparin (LOVENOX) injection  80 mg Subcutaneous Q24H  . furosemide  20 mg Intravenous Q12H  . hydrOXYzine  25 mg Oral QHS  . ipratropium-albuterol  3 mL Nebulization Q6H  . isosorbide mononitrate  30 mg Oral Daily  . methylPREDNISolone (SOLU-MEDROL) injection  80 mg Intravenous 3 times per day  . mometasone-formoterol  2 puff Inhalation Q12H  .  nitroGLYCERIN  0.4 mg Sublingual Once  . potassium chloride SA  20 mEq Oral Daily  . sodium chloride  3 mL Intravenous Q12H    PRN Medications: sodium chloride, ALPRAZolam, morphine injection, sodium chloride, traZODone   Assessment and Plan:   1. Syncopal Episode: Orthostatics are negative. He states he "passed out" while having bath this am. Aid who assisted states he did not, but was very tired during the bath. Review of telemetry was negative for pauses or ventricular arrhythmias. He is having some rare blocked PACs,  BP is stable.  2. CHF: He has diuresed approx 2 liters over the last 48 hrs on IV Lasix. He states his legs and ankles are still swollen. There is mild non-pitting ankle edema. On HydroDiuril at home.  Creatinine 0.87. Wt down 5 lbs from highest recorded.  Will continue IV Lasix for now.   3. CAD: Non-obstructive per cath in January of 2015. Medical management with risk modifications are recommended. 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  No bradycardia.  4. COPD: Appears to be having exacerbation. Continues to smoke. Treatment with nebs per [facility]TH.  5. Chronic Pain: Continues to require/request frequent doses of morphine for generalized pain in back and shoulders.    Bettey Mare. Lyman Bishop NP [facility] 12/06/2013, 10:14 AM   Attending note:  Patient seen and examined. Modified above note by Ms. Lawrence NP. Mr. Rosato continues to complain of shortness of breath, recurring cough, and wheezing. He reports atypical, pleuritic chest pain mainly associated with his coughing. Doubt that there is a major contributor to his symptoms from a cardiac perspective. Does have some volume overload and would continue with IV Lasix, ultimately transitioning to oral Lasix at discharge. Review of telemetry shows no significant bradyarrhythmias or pauses. No further cardiac testing is planned at this time. We will sign off.  Jonelle Sidle, M.D., F.A.C.C.

## 2013-12-06 NOTE — Progress Notes (Signed)
PROGRESS NOTE  [name] ZOX:[id number] DOB: [birth year][place]aleigh Lions/06/1956 DOA: 12/05/2013 PCP: Toma DeitersHASANAJ,[name], MD  Assessment/Plan: Acute hypoxemic respiratory failure  -Secondary to CHF decompensation and COPD exacerbation  -Continue supplemental oxygen  -goal SaO2>92%  COPD exacerbation  -Patient has approximately 80-pack-year history  -Continues to smoke  -continue IV Solu-Medrol  -Continue albuterol and Atrovent aerosolized  Acute diastolic CHF  -09/13/2013 echocardiogram shows EF 55-60%  -Clinically hypervolemic  -09/13/2013 weight 149.2 kg  -12/05/2013 weight 156.7 kg  -Daily weights--unclear accuracy as wt increasing with neg fluid balance -Fluid restriction  -Continue IV furosemide  -Appreciate cardiology input  -albumin 3.7 Syncope  -Orthostatics negative  -Etiology unclear  -Patient refuses Holter monitor  -no pauses on tele Atypical chest pain  -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  -Continue aspirin 81 mg daily  -Continue Imdur and Lipitor  -Troponins negative x3  Chronic pain  -Patient takes Percocet 10/325 every 8 hours  -Adjust morphine to 2 mg every 3 hours when necessary pain which is equivalent to oxycodone 30 mg daily po which is his home dose (percocet 10/325 tid) -Have discussed with the patient I will not manage his chronic pain at d/c nor  provide a prescription at the time of discharge  -Patient threatened to leave AGAINST MEDICAL ADVICE today, but after discussing with the patient the risks involved including but not limited to respiratory failure and death, the patient agreed to stay in the hospital.  Leg pain and edema  -Venous duplex--negative for DVT Family Communication: Pt at beside--total time 35 min. greater than 50% of time spent counseling the patient coordinating care.  Disposition Plan: Home when medically stable        Procedures/Studies: [place] Venous Img Lower Bilateral  12/06/2013   CLINICAL DATA:  Leg pain,  edema.  EXAM: BILATERAL LOWER EXTREMITY VENOUS DOPPLER ULTRASOUND  TECHNIQUE: Gray-scale sonography with compression, as well as color and duplex ultrasound, were performed to evaluate the deep venous system from the level of the common femoral vein through the popliteal and proximal calf veins.  COMPARISON:  None  FINDINGS: Normal compressibility of the common femoral, superficial femoral, and popliteal veins, as well as the proximal calf veins. No filling defects to suggest DVT on grayscale or color Doppler imaging. Doppler waveforms show normal direction of venous flow, normal respiratory phasicity and response to augmentation. Visualized segments of greater saphenous veins are normal and caliber and compressibility.  IMPRESSION: No evidence of  lower extremity deep vein thrombosis.   Electronically Signed   By: Oley Balmaniel  Hassell M.D.   On: 12/06/2013 12:43   Dg Chest Portable 1 View  12/05/2013   CLINICAL DATA:  Shortness of breath, chest pain and lower extremity edema.  EXAM: PORTABLE CHEST - 1 VIEW  COMPARISON:  DG CHEST 2 VIEW dated 09/11/2013  FINDINGS: There likely is interstitial edema superimposed on chronic lung disease. The heart is mildly enlarged. No focal airspace consolidation or pleural fluid is identified.  IMPRESSION: Interstitial edema superimposed on chronic lung disease.   Electronically Signed   By: Irish LackGlenn  Yamagata M.D.   On: 12/05/2013 02:09         Subjective: Patient states his breathing is much better. Continues to complain of chest wall pain. He denies any fevers, chills, nausea, vomiting, diarrhea, vomiting, dysuria, hematuria. Patient states that he "passed out" during his breath this morning. Discussion with the patient's aide states that he did not pass out.  Objective: Filed  Vitals:   12/06/13 0949 12/06/13 0952 12/06/13 0956 12/06/13 1447  BP: 119/75 120/77 122/76 132/65  Pulse: 83 83 82 84  Temp: 98.2 F (36.8 C) 98 F (36.7 C) 98.4 F (36.9 C) 97.3 F (36.3 C)   TempSrc: Oral Oral Oral Oral  Resp: 20 20 20 20   Height:      Weight:      SpO2: 95% 95% 96% 96%    Intake/Output Summary (Last 24 hours) at 12/06/13 1812 Last data filed at 12/06/13 1255  Gross per 24 hour  Intake    600 ml  Output   2410 ml  Net  -1810 ml   Weight change: -1.969 kg (-4 lb 5.4 oz) Exam:   General:  Pt is alert, follows commands appropriately, not in acute distress  HEENT: No icterus, No thrush, [place]/AT  Cardiovascular: RRR, S1/S2, no rubs, no gallops  Respiratory: Bilateral expiratory wheezing. Scattered rales bilaterally. Good air movement.  Abdomen: Soft/+BS, non tender, non distended, no guarding  Extremities: 1+ LE edema, No lymphangitis, No petechiae, No rashes, no synovitis  Data Reviewed: Basic Metabolic Panel:  Recent Labs Lab 12/05/13 0146 12/06/13 0606  NA 136* 136*  K 3.6* 3.9  CL 98 97  CO2 24 26  GLUCOSE 126* 222*  BUN 15 16  CREATININE 1.02 0.87  CALCIUM 9.1 9.3  MG  --  2.0   Liver Function Tests:  Recent Labs Lab 12/05/13 0146 12/06/13 0606  AST 26 24  ALT 28 28  ALKPHOS 61 61  BILITOT 0.4 0.3  PROT 7.6 7.9  ALBUMIN 3.7 3.7   No results found for this basename: LIPASE, AMYLASE,  in the last 168 hours No results found for this basename: AMMONIA,  in the last 168 hours CBC:  Recent Labs Lab 12/05/13 0146 12/05/13 0639 12/06/13 0606  WBC 9.2 8.3 5.9  NEUTROABS 5.4  --   --   HGB 13.2 12.9* 13.4  HCT 38.3* 37.2* 39.3  MCV 93.6 94.7 94.5  PLT 211 193 236   Cardiac Enzymes:  Recent Labs Lab 12/05/13 0146 12/05/13 0639 12/05/13 1202 12/05/13 1824  TROPONINI <0.30 <0.30 <0.30 <0.30   BNP: No components found with this basename: POCBNP,  CBG: No results found for this basename: GLUCAP,  in the last 168 hours  No results found for this or any previous visit (from the past 240 hour(s)).   Scheduled Meds: . amLODipine  5 mg Oral Daily  . aspirin  81 mg Oral Daily  . atorvastatin  10 mg Oral q1800  .  benzonatate  200 mg Oral TID  . citalopram  40 mg Oral Daily  . enoxaparin (LOVENOX) injection  80 mg Subcutaneous Q24H  . furosemide  20 mg Intravenous Q12H  . hydrOXYzine  25 mg Oral QHS  . ipratropium-albuterol  3 mL Nebulization Q6H  . isosorbide mononitrate  30 mg Oral Daily  . methylPREDNISolone (SOLU-MEDROL) injection  80 mg Intravenous 3 times per day  . mometasone-formoterol  2 puff Inhalation Q12H  . nitroGLYCERIN  0.4 mg Sublingual Once  . potassium chloride SA  20 mEq Oral Daily  . sodium chloride  3 mL Intravenous Q12H   Continuous Infusions:    Catarina Hartshorn, DO  [facility] Pager [phone]  If 7PM-7AM, please contact night-coverage [email or web address] Password [email or web address] 12/06/2013, 6:12 PM   LOS: 1 day

## 2013-12-06 NOTE — Care Management Utilization Note (Signed)
UR completed 

## 2013-12-06 NOTE — Progress Notes (Signed)
Patient refusing to wear telemetry, informed the patient of the importance of wearing the monitor,he still refuses, Dr Claiborne Billings notified.MD informed me to document patient refusing monitor.

## 2013-12-06 NOTE — Progress Notes (Signed)
Inpatient Diabetes Program Recommendations  AACE/ADA: New Consensus Statement on Inpatient Glycemic Control (2013)  Target Ranges:  Prepandial:   less than 140 mg/dL      Peak postprandial:   less than 180 mg/dL (1-2 hours)      Critically ill patients:  140 - 180 mg/dL  Results for DELVONTE, SOMOZA (MRN [id number]) as of 12/06/2013 10:40  Ref. Range 09/13/2013 11:15  Hemoglobin A1C Latest Range: <5.7 % 5.9 (H)   Results for VIBHU, REYMUNDO (MRN [id number]) as of 12/06/2013 10:40  Ref. Range 12/06/2013 06:06  Glucose Latest Range: 70-99 mg/dL 009 (H)   Diabetes history: No Outpatient Diabetes medications: NA Current orders for Inpatient glycemic control: None  Inpatient Diabetes Program Recommendations Correction (SSI): While inpatient and receiving steroids, please consider ordering CBGs with Novolog correction scale ACHS.  Note: Noted patient is ordered Solumedrol 80 mg 8H and fasting lab glucose was 222 mg/dl this morning.  Please consider ordering CBGs with Novolog correction scale ACHS while inpatient and receiving steroids.  Thanks, Orlando Penner, RN, MSN, CCRN Diabetes Coordinator Inpatient Diabetes Program [phone] (Team Pager) [phone] ([facility] office) [phone] [facility] office)

## 2013-12-07 LAB — CBC
HEMATOCRIT: 39.7 % (ref 39.0–52.0)
HEMOGLOBIN: 13.4 g/dL (ref 13.0–17.0)
MCH: 32.3 pg (ref 26.0–34.0)
MCHC: 33.8 g/dL (ref 30.0–36.0)
MCV: 95.7 fL (ref 78.0–100.0)
Platelets: 224 10*3/uL (ref 150–400)
RBC: 4.15 MIL/uL — ABNORMAL LOW (ref 4.22–5.81)
RDW: 12.5 % (ref 11.5–15.5)
WBC: 11.9 10*3/uL — AB (ref 4.0–10.5)

## 2013-12-07 LAB — BASIC METABOLIC PANEL
BUN: 17 mg/dL (ref 6–23)
CHLORIDE: 97 meq/L (ref 96–112)
CO2: 28 meq/L (ref 19–32)
Calcium: 9.4 mg/dL (ref 8.4–10.5)
Creatinine, Ser: 0.78 mg/dL (ref 0.50–1.35)
GFR calc non Af Amer: >90 mL/min (ref >90)
Glucose, Bld: 261 mg/dL — ABNORMAL HIGH (ref 70–99)
POTASSIUM: 4.8 meq/L (ref 3.7–5.3)
Sodium: 137 mEq/L (ref 137–147)

## 2013-12-07 MED ORDER — METOPROLOL SUCCINATE ER 25 MG PO TB24: 25.0000 mg | ORAL_TABLET | Freq: Every day | ORAL | Status: DC

## 2013-12-07 MED ORDER — METOPROLOL SUCCINATE ER 25 MG PO TB24
25.0000 mg | ORAL_TABLET | Freq: Every day | ORAL | Status: DC
  Administered 2013-12-07: 25 mg via ORAL
  Filled 2013-12-07: qty 1

## 2013-12-07 NOTE — Progress Notes (Signed)
Pt mentioned to NT that he needed medication. Went to give pt medication and he is in bed, eyes closed. No sign of pain or distress. Will continue to monitor.

## 2013-12-07 NOTE — Progress Notes (Signed)
Patient ambulated in hallway around nursing station, room air saturation 93-94%. Patient tolerated ambulation well. Vital signs stable.

## 2013-12-07 NOTE — Discharge Summary (Signed)
Physician Discharge Summary  [name] ZOX:[id number] DOB: 10/13/1955 DOA: 12/05/2013  PCP: Toma DeitersHASANAJ,[name], MD  Admit date: 12/05/2013 Discharge date: 12/07/2013  Recommendations for Outpatient Follow-up:  1. Pt will need to follow up with PCP in 1 weeks post discharge 2. Please obtain BMP 3. Please also check CBC to evaluate Hg and Hct levels   Discharge Diagnoses:  Acute hypoxemic respiratory failure  -Secondary to CHF decompensation and COPD exacerbation  -Continue supplemental oxygen  -goal SaO2>92%  -The patient was weaned off of supplemental oxygen. His oxygen saturation was 94-96% on room air. COPD exacerbation  -Patient has approximately 80-pack-year history  -Continues to smoke  -continue IV Solu-Medrol--> the patient's wheezing significantly improved -He will be discharged with prednisone 60 mg twice a day x2 days, 40 mg twice a day x2 days, 20 mg twice a day x2 days -Continue albuterol and Atrovent aerosolized at home Acute diastolic CHF  -09/13/2013 echocardiogram shows EF 55-60%  -Clinically hypervolemic  -09/13/2013 weight 149.2 kg  -12/05/2013 weight 156.7 kg  -Daily weights--unclear accuracy as wt increasing with neg fluid balance  -Fluid restriction  -Continue IV furosemide-converted to oral furosemide 40 mg daily at home-> -Appreciate cardiology input  -albumin 3.7  -The patient was negative approximately 2 L for the admission  -I encouraged the patient to remain in the hospital for continued intravenous diuresis, but he was adamant for discharge due to family issues with his wife's health  -I instructed the patient to follow up with his primary care provider on Monday, 12/10/2013 for lab work and followup appointment  -He was instructed to return to the ED ASAP if his breathing worsens  -On the discharge, the patient stated that his breathing was 45-50% better  Syncope  -Orthostatics negative  -Etiology unclear  -Patient refuses Holter monitor  -no  pauses on tele  Atypical chest pain  -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  -Continue aspirin 81 mg daily  -Continue Imdur and Lipitor  -Troponins negative x3  Chronic pain  -Patient takes Percocet 10/325 every 8 hours  -Adjust morphine to 2 mg every 3 hours when necessary pain which is equivalent to oxycodone 30 mg daily po which is his home dose (percocet 10/325 tid)  -Have discussed with the patient I will not manage his chronic pain at d/c nor  provide a prescription at the time of discharge  -Patient threatened to leave AGAINST MEDICAL ADVICE today, but after discussing with the patient the risks involved including but not limited to respiratory failure and death, the patient agreed to stay in the hospital.  Leg pain and edema  -Venous duplex--negative for DVT  Hypertension -Amlodipine was discontinued--this will also help with the patient's nonpitting lower extremity edema -Patient was started on metoprolol succinate 25 mg daily in the setting of CHF  Disposition Plan: Home    Discharge Condition: Stable  Disposition:  home  Diet: Heart healthy Wt Readings from Last 3 Encounters:  12/07/13 158.5 kg (349 lb 6.9 oz)  09/21/13 157.398 kg (347 lb)  09/13/13 149.2 kg (328 lb 14.8 oz)    History of present illness:  58 y.o.male with hx of COPD (chronic obstructive pulmonary disease); Essential hypertension, benign; Chronic back pain; Anxiety disorder; Hyperlipidemia; Depression; Obesity; History of alcohol abuse; History of cocaine abuse; Pneumonia; GERD (gastroesophageal reflux disease); Headache(784.0); Arthritis; and CAD (coronary artery disease). admitted with worsening shortness of breath, lower extremity edema, stating he felt his tongue was also swelling, with increased wheezing and coughing. He also  reports a syncopal episode when he stood up to go the bathroom overnight. Patient's wife states that he has been slowly "gaining fluid." He drinks alcohol  daily, including on the day of presentation. He tells that he is trying to cut back.  Cardiac markers are negative. D-dimer was negative. He is mildly hypokalemic with a potassium of 3.6 his sodium was 136 with glucose 126. Chest x-ray revealed interstitial edema superimposed on chronic lung disease. In ER he was given sublingual aspirin 324 mg, Lasix 20 mg IV times one potassium 20 mEq, and morphine. The patient was placed on a nitroglycerin patch. The patient states the only thing that controls his pain is the morphine, which has been receiving regularly per the primary team.  He was seen by Dr. Diona Browner in the office in January 2015 with plans for a Holter monitor to be placed in the setting of prior syncopal episode. Patient refused to wear the Holter monitor after being placed on 09/14/2013. Therefore no telemetry strips are available, for further evaluation of syncope .The patient has a known history of nonobstructive CAD with most recent cardiac catheterization January 2015. He was taken off of beta blockers secondary occasional sinus pauses found on telemetry during the hospitalization. The patient had medically managed CAD. Unfortunately continues to smoke. 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     Consultants: Cardiology  Discharge Exam: Filed Vitals:   12/07/13 0804  BP: 129/77  Pulse: 85  Temp:   Resp:    Filed Vitals:   12/06/13 2242 12/07/13 0702 12/07/13 0804 12/07/13 0813  BP: 137/75 149/77 129/77   Pulse: 98 82 85   Temp:  98.1 F (36.7 C)    TempSrc:  Oral    Resp:  20    Height:      Weight:    158.5 kg (349 lb 6.9 oz)  SpO2:  94%     General: A&O x 3, NAD, pleasant, cooperative Cardiovascular: RRR, no rub, no gallop, no S3 Respiratory: Bibasilar crackles. No wheezing. Good air movement.  Abdomen:soft, nontender, nondistended, positive bowel sounds Extremities: 1+ LE edema, No lymphangitis, no petechiae  Discharge  Instructions      Discharge Orders   Future Orders Complete By Expires   Diet - low sodium heart healthy  As directed    Discharge instructions  As directed    Increase activity slowly  As directed        Medication List    STOP taking these medications       amLODipine 5 MG tablet  Commonly known as:  NORVASC     hydrochlorothiazide 25 MG tablet  Commonly known as:  HYDRODIURIL      TAKE these medications       ALPRAZolam 1 MG tablet  Commonly known as:  XANAX  Take 1 tablet (1 mg total) by mouth 3 (three) times daily as needed for anxiety. For anxiety     aspirin 81 MG tablet  Take 1 tablet (81 mg total) by mouth daily.     atorvastatin 10 MG tablet  Commonly known as:  LIPITOR  Take 1 tablet (10 mg total) by mouth daily at 6 PM.     budesonide-formoterol 160-4.5 MCG/ACT inhaler  Commonly known as:  SYMBICORT  Inhale 2 puffs into the lungs 2 (two) times daily.     citalopram 40 MG tablet  Commonly known as:  CELEXA  Take 40 mg by mouth daily.  furosemide 40 MG tablet  Commonly known as:  LASIX  Take 1 tablet (40 mg total) by mouth daily.     hydrOXYzine 25 MG capsule  Commonly known as:  VISTARIL  Take 25 mg by mouth daily as needed for itching. For itching     ipratropium-albuterol 0.5-2.5 (3) MG/3ML Soln  Commonly known as:  DUONEB  Take 3 mLs by nebulization every 6 (six) hours as needed (shortness of breath/ wheezing.).     isosorbide mononitrate 30 MG 24 hr tablet  Commonly known as:  IMDUR  Take 1 tablet (30 mg total) by mouth daily.     meloxicam 15 MG tablet  Commonly known as:  MOBIC  Take 1 tablet by mouth daily.     metoprolol succinate 25 MG 24 hr tablet  Commonly known as:  TOPROL-XL  Take 1 tablet (25 mg total) by mouth daily.     nitroGLYCERIN 0.4 MG SL tablet  Commonly known as:  NITROSTAT  Place 0.4 mg under the tongue every 5 (five) minutes as needed. For chest pain     omeprazole 20 MG capsule  Commonly known as:   PRILOSEC  Take 20 mg by mouth daily.     oxyCODONE-acetaminophen 10-325 MG per tablet  Commonly known as:  PERCOCET  Take 1 tablet by mouth every 8 (eight) hours as needed for pain.     potassium chloride SA 20 MEQ tablet  Commonly known as:  K-DUR,KLOR-CON  Take 1 tablet by mouth daily.     predniSONE 20 MG tablet  Commonly known as:  DELTASONE  Take 3 tablets (60 mg total) by mouth 2 (two) times daily with a meal. Take 3 tablets two times daily x 2 days; then 2 tablets (40mg ) two times daily x 2 days, then 1 tablet (20mg ) two times a day x 2 days     PROAIR HFA 108 (90 BASE) MCG/ACT inhaler  Generic drug:  albuterol  Inhale 2 puffs into the lungs daily as needed for wheezing or shortness of breath.     traZODone 100 MG tablet  Commonly known as:  DESYREL  Take 100 mg by mouth at bedtime.         The results of significant diagnostics from this hospitalization (including imaging, microbiology, ancillary and laboratory) are listed below for reference.    Significant Diagnostic Studies: [place] Venous Img Lower Bilateral  12/06/2013   CLINICAL DATA:  Leg pain, edema.  EXAM: BILATERAL LOWER EXTREMITY VENOUS DOPPLER ULTRASOUND  TECHNIQUE: Gray-scale sonography with compression, as well as color and duplex ultrasound, were performed to evaluate the deep venous system from the level of the common femoral vein through the popliteal and proximal calf veins.  COMPARISON:  None  FINDINGS: Normal compressibility of the common femoral, superficial femoral, and popliteal veins, as well as the proximal calf veins. No filling defects to suggest DVT on grayscale or color Doppler imaging. Doppler waveforms show normal direction of venous flow, normal respiratory phasicity and response to augmentation. Visualized segments of greater saphenous veins are normal and caliber and compressibility.  IMPRESSION: No evidence of  lower extremity deep vein thrombosis.   Electronically Signed   By: Oley Balm M.D.    On: 12/06/2013 12:43   Dg Chest Portable 1 View  12/05/2013   CLINICAL DATA:  Shortness of breath, chest pain and lower extremity edema.  EXAM: PORTABLE CHEST - 1 VIEW  COMPARISON:  DG CHEST 2 VIEW dated 09/11/2013  FINDINGS: There likely is interstitial  edema superimposed on chronic lung disease. The heart is mildly enlarged. No focal airspace consolidation or pleural fluid is identified.  IMPRESSION: Interstitial edema superimposed on chronic lung disease.   Electronically Signed   By: Irish Lack M.D.   On: 12/05/2013 02:09     Microbiology: No results found for this or any previous visit (from the past 240 hour(s)).   Labs: Basic Metabolic Panel:  Recent Labs Lab 12/05/13 0146 12/06/13 0606 12/07/13 0559  NA 136* 136* 137  K 3.6* 3.9 4.8  CL 98 97 97  CO2 24 26 28   GLUCOSE 126* 222* 261*  BUN 15 16 17   CREATININE 1.02 0.87 0.78  CALCIUM 9.1 9.3 9.4  MG  --  2.0  --    Liver Function Tests:  Recent Labs Lab 12/05/13 0146 12/06/13 0606  AST 26 24  ALT 28 28  ALKPHOS 61 61  BILITOT 0.4 0.3  PROT 7.6 7.9  ALBUMIN 3.7 3.7   No results found for this basename: LIPASE, AMYLASE,  in the last 168 hours No results found for this basename: AMMONIA,  in the last 168 hours CBC:  Recent Labs Lab 12/05/13 0146 12/05/13 0639 12/06/13 0606 12/07/13 0559  WBC 9.2 8.3 5.9 11.9*  NEUTROABS 5.4  --   --   --   HGB 13.2 12.9* 13.4 13.4  HCT 38.3* 37.2* 39.3 39.7  MCV 93.6 94.7 94.5 95.7  PLT 211 193 236 224   Cardiac Enzymes:  Recent Labs Lab 12/05/13 0146 12/05/13 0639 12/05/13 1202 12/05/13 1824  TROPONINI <0.30 <0.30 <0.30 <0.30   BNP: No components found with this basename: POCBNP,  CBG: No results found for this basename: GLUCAP,  in the last 168 hours  Time coordinating discharge:  Greater than 30 minutes  Signed:  Catarina Hartshorn, DO [facility] Pager: [phone] 12/07/2013, 8:15 AM

## 2014-08-01 ENCOUNTER — Encounter (HOSPITAL_COMMUNITY): Payer: Self-pay | Admitting: Cardiovascular Disease

## 2014-08-06 ENCOUNTER — Other Ambulatory Visit: Payer: Self-pay | Admitting: Cardiology

## 2014-08-08 ENCOUNTER — Emergency Department (HOSPITAL_COMMUNITY): Payer: Medicare HMO

## 2014-08-08 ENCOUNTER — Encounter (HOSPITAL_COMMUNITY): Payer: Self-pay | Admitting: Emergency Medicine

## 2014-08-08 ENCOUNTER — Inpatient Hospital Stay (HOSPITAL_COMMUNITY)
Admission: EM | Admit: 2014-08-08 | Discharge: 2014-08-12 | DRG: 191 | Disposition: A | Discharge status: Home / Self Care | Payer: Medicare HMO | Attending: Internal Medicine | Admitting: Internal Medicine

## 2014-08-08 DIAGNOSIS — J441 Chronic obstructive pulmonary disease with (acute) exacerbation: Secondary | ICD-10-CM | POA: Diagnosis present

## 2014-08-08 DIAGNOSIS — R0789 Other chest pain: Secondary | ICD-10-CM

## 2014-08-08 DIAGNOSIS — R109 Unspecified abdominal pain: Secondary | ICD-10-CM

## 2014-08-08 DIAGNOSIS — M1990 Unspecified osteoarthritis, unspecified site: Secondary | ICD-10-CM | POA: Diagnosis present

## 2014-08-08 DIAGNOSIS — R112 Nausea with vomiting, unspecified: Secondary | ICD-10-CM | POA: Diagnosis present

## 2014-08-08 DIAGNOSIS — Z8249 Family history of ischemic heart disease and other diseases of the circulatory system: Secondary | ICD-10-CM

## 2014-08-08 DIAGNOSIS — F329 Major depressive disorder, single episode, unspecified: Secondary | ICD-10-CM | POA: Diagnosis present

## 2014-08-08 DIAGNOSIS — I25118 Atherosclerotic heart disease of native coronary artery with other forms of angina pectoris: Secondary | ICD-10-CM | POA: Diagnosis present

## 2014-08-08 DIAGNOSIS — F419 Anxiety disorder, unspecified: Secondary | ICD-10-CM | POA: Diagnosis present

## 2014-08-08 DIAGNOSIS — R0602 Shortness of breath: Secondary | ICD-10-CM

## 2014-08-08 DIAGNOSIS — Z7952 Long term (current) use of systemic steroids: Secondary | ICD-10-CM

## 2014-08-08 DIAGNOSIS — Z96642 Presence of left artificial hip joint: Secondary | ICD-10-CM | POA: Diagnosis present

## 2014-08-08 DIAGNOSIS — R079 Chest pain, unspecified: Secondary | ICD-10-CM | POA: Diagnosis present

## 2014-08-08 DIAGNOSIS — I2510 Atherosclerotic heart disease of native coronary artery without angina pectoris: Secondary | ICD-10-CM | POA: Diagnosis present

## 2014-08-08 DIAGNOSIS — I10 Essential (primary) hypertension: Secondary | ICD-10-CM | POA: Diagnosis present

## 2014-08-08 DIAGNOSIS — R001 Bradycardia, unspecified: Secondary | ICD-10-CM | POA: Diagnosis present

## 2014-08-08 DIAGNOSIS — Z7982 Long term (current) use of aspirin: Secondary | ICD-10-CM

## 2014-08-08 DIAGNOSIS — Z720 Tobacco use: Secondary | ICD-10-CM

## 2014-08-08 DIAGNOSIS — R7303 Prediabetes: Secondary | ICD-10-CM | POA: Diagnosis present

## 2014-08-08 DIAGNOSIS — J9801 Acute bronchospasm: Secondary | ICD-10-CM | POA: Diagnosis present

## 2014-08-08 DIAGNOSIS — K219 Gastro-esophageal reflux disease without esophagitis: Secondary | ICD-10-CM | POA: Diagnosis present

## 2014-08-08 DIAGNOSIS — F17210 Nicotine dependence, cigarettes, uncomplicated: Secondary | ICD-10-CM | POA: Diagnosis present

## 2014-08-08 DIAGNOSIS — E119 Type 2 diabetes mellitus without complications: Secondary | ICD-10-CM | POA: Diagnosis present

## 2014-08-08 DIAGNOSIS — Z79899 Other long term (current) drug therapy: Secondary | ICD-10-CM

## 2014-08-08 DIAGNOSIS — Z6841 Body Mass Index (BMI) 40.0 and over, adult: Secondary | ICD-10-CM

## 2014-08-08 DIAGNOSIS — E785 Hyperlipidemia, unspecified: Secondary | ICD-10-CM | POA: Diagnosis present

## 2014-08-08 LAB — COMPREHENSIVE METABOLIC PANEL
ALT: 19 U/L (ref 0–53)
AST: 18 U/L (ref 0–37)
Albumin: 3.7 g/dL (ref 3.5–5.2)
Alkaline Phosphatase: 59 U/L (ref 39–117)
Anion gap: 14 (ref 5–15)
BILIRUBIN TOTAL: 0.4 mg/dL (ref 0.3–1.2)
BUN: 13 mg/dL (ref 6–23)
CO2: 26 mEq/L (ref 19–32)
Calcium: 9.5 mg/dL (ref 8.4–10.5)
Chloride: 98 mEq/L (ref 96–112)
Creatinine, Ser: 1.03 mg/dL (ref 0.50–1.35)
GFR calc Af Amer: >90 mL/min (ref >90)
GFR, EST NON AFRICAN AMERICAN: 79 mL/min — AB (ref >90)
Glucose, Bld: 128 mg/dL — ABNORMAL HIGH (ref 70–99)
POTASSIUM: 3.4 meq/L — AB (ref 3.7–5.3)
SODIUM: 138 meq/L (ref 137–147)
Total Protein: 7.5 g/dL (ref 6.0–8.3)

## 2014-08-08 LAB — CBC WITH DIFFERENTIAL/PLATELET
BASOS PCT: 1 % (ref 0–1)
Basophils Absolute: 0.1 10*3/uL (ref 0.0–0.1)
EOS ABS: 0.7 10*3/uL (ref 0.0–0.7)
Eosinophils Relative: 7 % — ABNORMAL HIGH (ref 0–5)
HCT: 41.2 % (ref 39.0–52.0)
Hemoglobin: 14.2 g/dL (ref 13.0–17.0)
Lymphocytes Relative: 26 % (ref 12–46)
Lymphs Abs: 2.7 10*3/uL (ref 0.7–4.0)
MCH: 32.3 pg (ref 26.0–34.0)
MCHC: 34.5 g/dL (ref 30.0–36.0)
MCV: 93.8 fL (ref 78.0–100.0)
MONOS PCT: 11 % (ref 3–12)
Monocytes Absolute: 1.1 10*3/uL — ABNORMAL HIGH (ref 0.1–1.0)
Neutro Abs: 5.5 10*3/uL (ref 1.7–7.7)
Neutrophils Relative %: 55 % (ref 43–77)
Platelets: 232 10*3/uL (ref 150–400)
RBC: 4.39 MIL/uL (ref 4.22–5.81)
RDW: 12.3 % (ref 11.5–15.5)
WBC: 10.1 10*3/uL (ref 4.0–10.5)

## 2014-08-08 LAB — TROPONIN I
Troponin I: <0.3 ng/mL (ref <0.30)
Troponin I: <0.3 ng/mL (ref <0.30)

## 2014-08-08 LAB — PRO B NATRIURETIC PEPTIDE: Pro B Natriuretic peptide (BNP): 27.9 pg/mL (ref 0–125)

## 2014-08-08 MED ORDER — SODIUM CHLORIDE 0.9 % IJ SOLN
3.0000 mL | Freq: Two times a day (BID) | INTRAMUSCULAR | Status: DC
  Administered 2014-08-09 – 2014-08-11 (×6): 3 mL via INTRAVENOUS

## 2014-08-08 MED ORDER — HEPARIN SODIUM (PORCINE) 5000 UNIT/ML IJ SOLN
5000.0000 [IU] | Freq: Three times a day (TID) | INTRAMUSCULAR | Status: DC
  Administered 2014-08-08 – 2014-08-12 (×11): 5000 [IU] via SUBCUTANEOUS
  Filled 2014-08-08 (×12): qty 1

## 2014-08-08 MED ORDER — AMLODIPINE BESYLATE 5 MG PO TABS
5.0000 mg | ORAL_TABLET | Freq: Every day | ORAL | Status: DC
  Administered 2014-08-09 – 2014-08-12 (×4): 5 mg via ORAL
  Filled 2014-08-08 (×4): qty 1

## 2014-08-08 MED ORDER — HYDROCHLOROTHIAZIDE 25 MG PO TABS
25.0000 mg | ORAL_TABLET | Freq: Every day | ORAL | Status: DC
  Administered 2014-08-09 – 2014-08-12 (×4): 25 mg via ORAL
  Filled 2014-08-08 (×4): qty 1

## 2014-08-08 MED ORDER — ALBUTEROL SULFATE (2.5 MG/3ML) 0.083% IN NEBU: 3.0000 mL | INHALATION_SOLUTION | Freq: Every day | RESPIRATORY_TRACT | Status: DC | PRN

## 2014-08-08 MED ORDER — METHYLPREDNISOLONE SODIUM SUCC 125 MG IJ SOLR
80.0000 mg | INTRAMUSCULAR | Status: DC
  Administered 2014-08-08: 80 mg via INTRAVENOUS
  Filled 2014-08-08: qty 2

## 2014-08-08 MED ORDER — IPRATROPIUM-ALBUTEROL 0.5-2.5 (3) MG/3ML IN SOLN: 3.0000 mL | Freq: Four times a day (QID) | RESPIRATORY_TRACT | Status: DC | PRN

## 2014-08-08 MED ORDER — OXYCODONE HCL 5 MG PO TABS
5.0000 mg | ORAL_TABLET | Freq: Four times a day (QID) | ORAL | Status: DC | PRN
  Administered 2014-08-08 – 2014-08-11 (×8): 5 mg via ORAL
  Filled 2014-08-08 (×9): qty 1

## 2014-08-08 MED ORDER — MELOXICAM 7.5 MG PO TABS
15.0000 mg | ORAL_TABLET | Freq: Every day | ORAL | Status: DC
  Administered 2014-08-09 – 2014-08-11 (×3): 15 mg via ORAL
  Filled 2014-08-08 (×3): qty 2
  Filled 2014-08-08: qty 1

## 2014-08-08 MED ORDER — SODIUM CHLORIDE 0.9 % IJ SOLN
3.0000 mL | Freq: Two times a day (BID) | INTRAMUSCULAR | Status: DC
  Administered 2014-08-09 – 2014-08-11 (×4): 3 mL via INTRAVENOUS

## 2014-08-08 MED ORDER — TRAZODONE HCL 50 MG PO TABS
100.0000 mg | ORAL_TABLET | Freq: Every day | ORAL | Status: DC
  Administered 2014-08-09 – 2014-08-11 (×3): 100 mg via ORAL
  Filled 2014-08-08 (×4): qty 2

## 2014-08-08 MED ORDER — IOHEXOL 350 MG/ML SOLN
120.0000 mL | Freq: Once | INTRAVENOUS | Status: AC | PRN
Start: 2014-08-08 — End: 2014-08-08
  Administered 2014-08-08: 120 mL via INTRAVENOUS

## 2014-08-08 MED ORDER — OXYCODONE-ACETAMINOPHEN 10-325 MG PO TABS: 1.0000 | ORAL_TABLET | Freq: Four times a day (QID) | ORAL | Status: DC | PRN

## 2014-08-08 MED ORDER — METOPROLOL SUCCINATE ER 25 MG PO TB24
25.0000 mg | ORAL_TABLET | Freq: Every day | ORAL | Status: DC
  Administered 2014-08-09: 25 mg via ORAL
  Filled 2014-08-08 (×2): qty 1

## 2014-08-08 MED ORDER — ONDANSETRON HCL 4 MG/2ML IJ SOLN
4.0000 mg | Freq: Once | INTRAMUSCULAR | Status: AC
  Administered 2014-08-08: 4 mg via INTRAVENOUS
  Filled 2014-08-08: qty 2

## 2014-08-08 MED ORDER — POTASSIUM CHLORIDE CRYS ER 20 MEQ PO TBCR
20.0000 meq | EXTENDED_RELEASE_TABLET | Freq: Every day | ORAL | Status: DC
  Administered 2014-08-09 – 2014-08-12 (×4): 20 meq via ORAL
  Filled 2014-08-08 (×4): qty 1

## 2014-08-08 MED ORDER — BUDESONIDE-FORMOTEROL FUMARATE 160-4.5 MCG/ACT IN AERO
2.0000 | INHALATION_SPRAY | Freq: Two times a day (BID) | RESPIRATORY_TRACT | Status: DC
  Administered 2014-08-09 – 2014-08-12 (×7): 2 via RESPIRATORY_TRACT
  Filled 2014-08-08: qty 6

## 2014-08-08 MED ORDER — ALPRAZOLAM 1 MG PO TABS
1.0000 mg | ORAL_TABLET | Freq: Three times a day (TID) | ORAL | Status: DC | PRN
Start: 2014-08-08 — End: 2014-08-12
  Administered 2014-08-08 – 2014-08-12 (×11): 1 mg via ORAL
  Filled 2014-08-08: qty 2
  Filled 2014-08-08 (×3): qty 1
  Filled 2014-08-08: qty 4
  Filled 2014-08-08 (×6): qty 1

## 2014-08-08 MED ORDER — MORPHINE SULFATE 4 MG/ML IJ SOLN
4.0000 mg | INTRAMUSCULAR | Status: DC | PRN
  Administered 2014-08-09 – 2014-08-11 (×10): 4 mg via INTRAVENOUS
  Filled 2014-08-08 (×10): qty 1

## 2014-08-08 MED ORDER — MORPHINE SULFATE 2 MG/ML IJ SOLN
2.0000 mg | Freq: Once | INTRAMUSCULAR | Status: AC
  Administered 2014-08-08: 2 mg via INTRAVENOUS
  Filled 2014-08-08: qty 1

## 2014-08-08 MED ORDER — SODIUM CHLORIDE 0.9 % IV SOLN
INTRAVENOUS | Status: DC
  Administered 2014-08-08: 18:00:00 via INTRAVENOUS

## 2014-08-08 MED ORDER — ATORVASTATIN CALCIUM 10 MG PO TABS
10.0000 mg | ORAL_TABLET | Freq: Every day | ORAL | Status: DC
  Administered 2014-08-09 – 2014-08-11 (×3): 10 mg via ORAL
  Filled 2014-08-08 (×3): qty 1

## 2014-08-08 MED ORDER — ONDANSETRON HCL 4 MG PO TABS: 4.0000 mg | ORAL_TABLET | Freq: Four times a day (QID) | ORAL | Status: DC | PRN

## 2014-08-08 MED ORDER — ASPIRIN 81 MG PO CHEW
81.0000 mg | CHEWABLE_TABLET | Freq: Every day | ORAL | Status: DC
  Administered 2014-08-09 – 2014-08-12 (×4): 81 mg via ORAL
  Filled 2014-08-08 (×4): qty 1

## 2014-08-08 MED ORDER — DOXEPIN HCL 25 MG PO CAPS
25.0000 mg | ORAL_CAPSULE | Freq: Every day | ORAL | Status: DC
  Administered 2014-08-09 – 2014-08-11 (×3): 25 mg via ORAL
  Filled 2014-08-08 (×3): qty 1

## 2014-08-08 MED ORDER — ONDANSETRON HCL 4 MG/2ML IJ SOLN
4.0000 mg | Freq: Four times a day (QID) | INTRAMUSCULAR | Status: DC | PRN
  Administered 2014-08-10 – 2014-08-12 (×5): 4 mg via INTRAVENOUS
  Filled 2014-08-08 (×6): qty 2

## 2014-08-08 MED ORDER — NITROGLYCERIN 0.4 MG SL SUBL
0.4000 mg | SUBLINGUAL_TABLET | SUBLINGUAL | Status: DC | PRN
  Administered 2014-08-08 – 2014-08-10 (×5): 0.4 mg via SUBLINGUAL
  Filled 2014-08-08 (×4): qty 1

## 2014-08-08 MED ORDER — ISOSORBIDE MONONITRATE ER 60 MG PO TB24
30.0000 mg | ORAL_TABLET | Freq: Every day | ORAL | Status: DC
  Administered 2014-08-09: 30 mg via ORAL
  Filled 2014-08-08: qty 1

## 2014-08-08 MED ORDER — PANTOPRAZOLE SODIUM 40 MG PO TBEC
40.0000 mg | DELAYED_RELEASE_TABLET | Freq: Every day | ORAL | Status: DC
  Administered 2014-08-09 – 2014-08-12 (×4): 40 mg via ORAL
  Filled 2014-08-08 (×4): qty 1

## 2014-08-08 MED ORDER — SODIUM CHLORIDE 0.9 % IV SOLN: INTRAVENOUS | Status: DC

## 2014-08-08 MED ORDER — OXYCODONE-ACETAMINOPHEN 5-325 MG PO TABS
1.0000 | ORAL_TABLET | Freq: Four times a day (QID) | ORAL | Status: DC | PRN
  Administered 2014-08-09 – 2014-08-11 (×8): 1 via ORAL
  Filled 2014-08-08 (×8): qty 1

## 2014-08-08 MED ORDER — FUROSEMIDE 40 MG PO TABS
40.0000 mg | ORAL_TABLET | Freq: Every day | ORAL | Status: DC
  Administered 2014-08-09 – 2014-08-10 (×2): 40 mg via ORAL
  Filled 2014-08-08 (×2): qty 1

## 2014-08-08 MED ORDER — NITROGLYCERIN 0.4 MG SL SUBL: 0.4000 mg | SUBLINGUAL_TABLET | SUBLINGUAL | Status: DC | PRN

## 2014-08-08 MED ORDER — CITALOPRAM HYDROBROMIDE 20 MG PO TABS
40.0000 mg | ORAL_TABLET | Freq: Every day | ORAL | Status: DC
  Administered 2014-08-09: 40 mg via ORAL
  Filled 2014-08-08 (×2): qty 2

## 2014-08-08 MED ORDER — ASPIRIN 81 MG PO CHEW
324.0000 mg | CHEWABLE_TABLET | Freq: Once | ORAL | Status: AC
  Administered 2014-08-08: 324 mg via ORAL
  Filled 2014-08-08: qty 4

## 2014-08-08 NOTE — H&P (Signed)
[facility] History and Physical  [name] [name] UYZ:[id number] DOB: 07/04/1956 DOA: 08/08/2014  Referring physician: ER PCP: [name], MD   Chief Complaint: Chest pain  HPI: [name] [name] is a 58 y.o. [name]  This is a 58 year old man who comes in with a 2 day history of left-sided chest pain. He describes the pain as sharp in nature lasting 30-40 seconds intermittently. He has dyspnea associated with the pain. The pain seems to radiate down his left arm. He has been investigated previously with cardiac catheterization that revealed moderate coronary artery disease, 70% stenosis distal right RCA, 50% OM 3 and normal left ventricular function. He has hyperlipidemia on statin therapy. He is a smoker. He is morbidly obese. He does not have diabetes. He does have family history of coronary artery disease with 2 siblings and mother. He denies any cough, fever with the pain. There is no significant leg swelling. There is no hemoptysis.   Review of Systems:  Apart from the symptoms mentioned above, all other systems are negative.   Past Medical History  Diagnosis Date  . COPD (chronic obstructive pulmonary disease)   . Essential hypertension, benign   . Chronic back pain   . Anxiety disorder   . Hyperlipidemia   . Depression   . Obesity   . History of alcohol abuse   . History of cocaine abuse   . Pneumonia   . GERD (gastroesophageal reflux disease)   . Headache(784.0)   . Arthritis   . CAD (coronary artery disease)     a. ~ 2000 cath at [facility];  b. 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   Past Surgical History  Procedure Laterality Date  . Hip surgery      rt  . Cholecystectomy    . Back surgery    . Nasal/sinus endoscopy    . Total hip arthroplasty  05/08/2012    Procedure: TOTAL HIP ARTHROPLASTY;  Surgeon: Nestor Lewandowsky, MD;   Location: [facility] OR;  Service: Orthopedics;  Laterality: Left;  . Joint replacement    . Left heart catheterization with coronary angiogram N/A 09/12/2013    Procedure: LEFT HEART CATHETERIZATION WITH CORONARY ANGIOGRAM;  Surgeon: Kathleene Hazel, MD;  Location: [facility] CATH LAB;  Service: Cardiovascular;  Laterality: N/A;   Social History:  reports that he has been smoking Cigarettes.  He has a 9.75 pack-year smoking history. He has never used smokeless tobacco. He reports that he does not drink alcohol or use illicit drugs.  Allergies  Allergen Reactions  . Ativan [Lorazepam] Other (See Comments)    "out of mind"  . Dilaudid [Hydromorphone Hcl] Other (See Comments)    " out of mind"  . Haloperidol Decanoate     Family History  Problem Relation Age of Onset  . Heart attack Mother   . Heart attack Brother   . Heart attack Sister      Prior to Admission medications   Medication Sig Start Date End Date Taking? Authorizing Provider  ALPRAZolam Prudy Feeler) 1 MG tablet Take 1 tablet (1 mg total) by mouth 3 (three) times daily as needed for anxiety. For anxiety 05/15/12  Yes Kathlen Mody, MD  amLODipine (NORVASC) 5 MG tablet Take 5 mg by mouth daily. 08/06/14  Yes Historical Provider, MD  aspirin 81 MG tablet Take 1 tablet (  81 mg total) by mouth daily. 05/31/12  Yes Kathlen Mody, MD  atorvastatin (LIPITOR) 10 MG tablet TAKE 1 TABLET DAILY AT 6:00PM. 08/06/14  Yes Jonelle Sidle, MD  budesonide-formoterol [facility]) 160-4.5 MCG/ACT inhaler Inhale 2 puffs into the lungs 2 (two) times daily.   Yes Historical Provider, MD  citalopram (CELEXA) 40 MG tablet Take 40 mg by mouth daily.     Yes Historical Provider, MD  doxepin (SINEQUAN) 25 MG capsule Take 25 mg by mouth at bedtime.  08/06/14  Yes Historical Provider, MD  furosemide (LASIX) 40 MG tablet Take 1 tablet (40 mg total) by mouth daily. 12/06/13  Yes Catarina Hartshorn, MD  hydrochlorothiazide (HYDRODIURIL) 25 MG tablet Take 25 mg by mouth daily.  08/06/14  Yes Historical Provider, MD  ipratropium-albuterol (DUONEB) 0.5-2.5 (3) MG/3ML SOLN Take 3 mLs by nebulization every 6 (six) hours as needed (shortness of breath/ wheezing.).    Yes Historical Provider, MD  isosorbide mononitrate (IMDUR) 30 MG 24 hr tablet Take 1 tablet (30 mg total) by mouth daily. 12/06/13  Yes Catarina Hartshorn, MD  meloxicam (MOBIC) 15 MG tablet Take 1 tablet by mouth daily. 08/13/13  Yes Historical Provider, MD  metoprolol succinate (TOPROL-XL) 25 MG 24 hr tablet Take 1 tablet (25 mg total) by mouth daily. 12/07/13  Yes Catarina Hartshorn, MD  nitroGLYCERIN (NITROSTAT) 0.4 MG SL tablet Place 0.4 mg under the tongue every 5 (five) minutes as needed. For chest pain   Yes Historical Provider, MD  omeprazole (PRILOSEC) 20 MG capsule Take 20 mg by mouth daily.   Yes Historical Provider, MD  oxyCODONE-acetaminophen (PERCOCET) 10-325 MG per tablet Take 1 tablet by mouth every 6 (six) hours as needed for pain.  05/22/12  Yes Donnetta Hutching, MD  potassium chloride SA (K-DUR,KLOR-CON) 20 MEQ tablet Take 1 tablet by mouth daily. 08/13/13  Yes Historical Provider, MD  traZODone (DESYREL) 100 MG tablet Take 100 mg by mouth at bedtime.    Yes Historical Provider, MD  predniSONE (DELTASONE) 20 MG tablet Take 3 tablets (60 mg total) by mouth 2 (two) times daily with a meal. Take 3 tablets two times daily x 2 days; then 2 tablets (40mg ) two times daily x 2 days, then 1 tablet (20mg ) two times a day x 2 days Patient not taking: Reported on 08/08/2014 12/06/13   Catarina Hartshorn, MD  PROAIR HFA 108 (90 BASE) MCG/ACT inhaler Inhale 2 puffs into the lungs daily as needed for wheezing or shortness of breath.  06/20/13   Historical Provider, MD   Physical Exam: Filed Vitals:   08/08/14 1900 08/08/14 1915 08/08/14 2015 08/08/14 2122  BP: 120/83 102/62 129/66 111/52  Pulse: 85 85 77 68  Temp:    98.6 F (37 C)  TempSrc:    Oral  Resp: 24 17 13 14   Height:    6\' 3"  (1.905 m)  SpO2: 94% 96% 98% 94%    Wt Readings  from Last 3 Encounters:  12/07/13 158.5 kg (349 lb 6.9 oz)  09/21/13 157.398 kg (347 lb)  09/13/13 149.2 kg (328 lb 14.8 oz)    General:  Appears calm and comfortable. He does not appear to be any significant pain. Eyes: PERRL, normal lids, irises & conjunctiva ENT: grossly normal hearing, lips & tongue Neck: no LAD, masses or thyromegaly Cardiovascular: RRR, no m/r/g. No LE edema. Telemetry: SR, no arrhythmias  Respiratory: Bilateral moderate wheezing. No crackles or bronchial breathing. Abdomen: soft, ntnd Skin: no rash or induration seen on limited  exam Musculoskeletal: He appears to have left anterior chest wall tenderness, reproducing the pain. Psychiatric: grossly normal mood and affect, speech fluent and appropriate Neurologic: grossly non-focal.          Labs on Admission:  Basic Metabolic Panel:  Recent Labs Lab 08/08/14 1755  NA 138  K 3.4*  CL 98  CO2 26  GLUCOSE 128*  BUN 13  CREATININE 1.03  CALCIUM 9.5   Liver Function Tests:  Recent Labs Lab 08/08/14 1755  AST 18  ALT 19  ALKPHOS 59  BILITOT 0.4  PROT 7.5  ALBUMIN 3.7   No results for input(s): LIPASE, AMYLASE in the last 168 hours. No results for input(s): AMMONIA in the last 168 hours. CBC:  Recent Labs Lab 08/08/14 1755  WBC 10.1  NEUTROABS 5.5  HGB 14.2  HCT 41.2  MCV 93.8  PLT 232   Cardiac Enzymes:  Recent Labs Lab 08/08/14 1755  TROPONINI <0.30    BNP (last 3 results)  Recent Labs  12/05/13 0146 08/08/14 1755  PROBNP 21.1 27.9   CBG: No results for input(s): GLUCAP in the last 168 hours.  Radiological Exams on Admission: Dg Chest 2 View  08/08/2014   CLINICAL DATA:  Pt c/o CP with radiation into LT shoulder and upper extremity today. Intermittent SOB and dizziness. Hx COPD, HTN, GERD, CAD, smoker. Initial encounter.  EXAM: CHEST  2 VIEW  COMPARISON:  07/31/2014  FINDINGS: Hyperinflation. Lateral view degraded by patient arm position. Numerous leads and wires  project over the chest. Midline trachea. Mild cardiomegaly. No pleural effusion or pneumothorax. Diffuse peribronchial thickening. Clear lungs.  IMPRESSION: COPD/chronic bronchitis.  No acute superimposed process.  Cardiomegaly without congestive failure.   Electronically Signed   By: Jeronimo GreavesKyle  Talbot M.D.   On: 08/08/2014 19:04   Ct Angio Chest Pe W/cm &/or Wo Cm  08/08/2014   CLINICAL DATA:  Dyspnea on exertion  EXAM: CT ANGIOGRAPHY CHEST WITH CONTRAST  TECHNIQUE: Multidetector CT imaging of the chest was performed using the standard protocol during bolus administration of intravenous contrast. Multiplanar CT image reconstructions and MIPs were obtained to evaluate the vascular anatomy.  CONTRAST:  120mL OMNIPAQUE IOHEXOL 350 MG/ML SOLN  COMPARISON:  Plain film from earlier in the same day  FINDINGS: The lungs are well aerated bilaterally. No focal infiltrate or sizable effusion is seen. Mild emphysematous changes are noted.  The thoracic inlet is within normal limits. The thoracic aorta in its branches are unremarkable. Note is made of a left vertebral origin from the aortic arch. Pulmonary artery is well visualized and demonstrates a normal branching pattern. No definitive pulmonary emboli are noted.  There are right hilar lymph nodes which appear relatively stable from the prior exam and likely of a benign etiology. A few paraesophageal lymph nodes are noted as well lying between the esophagus and the right bronchus intermedius. Scanning into the upper abdomen reveals no acute abnormality. The osseous structures are within normal limits for the patient's given age.  Review of the MIP images confirms the above findings.  IMPRESSION: No evidence of pulmonary emboli.  Stable right hilar and mediastinal lymph nodes when compared with the prior exam.  No acute abnormality is noted.   Electronically Signed   By: Alcide CleverMark  Lukens M.D.   On: 08/08/2014 20:51    EKG: Independently reviewed. Currently  unavailable.  Assessment/Plan   1. Left-sided chest pain, sharp lasting 30-40 seconds at a time. The character of the pain is not convincing for  cardiac pain but the patient has multiple risk factors and does have a history of coronary artery disease based on cardiac catheterization. This has been treated medically. 2. Tobacco abuse. 3. Hyperlipidemia. 4. Morbid obesity. 5. Bronchospasm on clinical examination. No evidence for infection or heart failure clinically.  Plan: 1. Admit to telemetry. 2. Serial cardiac enzymes to rule out cardiac ischemia/infarction. 3. Cardiology consultation in the morning. 4. Intravenous daily steroids for bronchospasm. I am sure he has COPD and although this may be his baseline, he may also have viral bronchitis. I don't see an indication for antibiotics with a normal white count, unremarkable chest x-ray and no other symptoms of infection.  Further recommendations will depend on patient's hospital progress.  Code Status: Full code.   DVT Prophylaxis: Heparin.  Family Communication: I discussed the plan with the patient at the bedside.   Disposition Plan: Home when medically stable.   Time spent: 60 minutes.  Wilson Singer [facility] Pager [phone].

## 2014-08-08 NOTE — ED Notes (Signed)
Patient c/o left side chest pain that radiates into left arm, shoulder and neck. Per patient pain started 12/9 in which he was seen at [facility]. Per patient potassium extremely low, was given potassium pills and told if no better to come back. Patient told to come here by cardiologist. Patient reports hx of CHF. Patient reports taking 81mg  aspirin today.

## 2014-08-08 NOTE — ED Provider Notes (Signed)
CSN: [id number]     Arrival date & time 08/08/14  1707 History   First MD Initiated Contact with Patient 08/08/14 1717     Chief Complaint  Patient presents with  . Chest Pain     (Consider location/radiation/quality/duration/timing/severity/associated sxs/prior Treatment) The history is provided by the patient.   58 year old male with onset of chest pain left-sided radiating to left arm starting at 1:30 in the morning yesterday. Today at noon on walking to the mailbox had sudden onset of exertional worse chest pain same location and shortness of breath had to stop and rest when he got to the mailbox. Mail box is only about 10 yards of a walk. Patient's states she's had a history of congestive heart failure. Patient had a cardiac cath in January 2015. [facility]. Patient takes baby aspirin a day. Patient stated pain was 10 out of 10.  Past Medical History  Diagnosis Date  . COPD (chronic obstructive pulmonary disease)   . Essential hypertension, benign   . Chronic back pain   . Anxiety disorder   . Hyperlipidemia   . Depression   . Obesity   . History of alcohol abuse   . History of cocaine abuse   . Pneumonia   . GERD (gastroesophageal reflux disease)   . Headache(784.0)   . Arthritis   . CAD (coronary artery disease)     a. ~ [facility];  b. 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   Past Surgical History  Procedure Laterality Date  . Hip surgery      rt  . Cholecystectomy    . Back surgery    . Nasal/sinus endoscopy    . Total hip arthroplasty  05/08/2012    Procedure: TOTAL HIP ARTHROPLASTY;  Surgeon: Nestor LewandowskyFrank J Rowan, MD;  Location: [facility] OR;  Service: Orthopedics;  Laterality: Left;  . Joint replacement    . Left heart catheterization with coronary angiogram N/A 09/12/2013    Procedure: LEFT HEART  CATHETERIZATION WITH CORONARY ANGIOGRAM;  Surgeon: Kathleene Hazelhristopher D McAlhany, MD;  [facility];  Service: Cardiovascular;  Laterality: N/A;   Family History  Problem Relation Age of Onset  . Heart attack Mother   . Heart attack Brother   . Heart attack Sister    History  Substance Use Topics  . Smoking status: Current Every Day Smoker -- 0.25 packs/day for 39 years    Types: Cigarettes  . Smokeless tobacco: Never Used  . Alcohol Use: No     Comment: denies    Review of Systems  Constitutional: Negative for fever.  HENT: Negative for congestion.   Eyes: Negative for visual disturbance.  Respiratory: Positive for shortness of breath.   Cardiovascular: Positive for chest pain and leg swelling.  Gastrointestinal: Negative for nausea, vomiting and abdominal pain.  Genitourinary: Negative for dysuria.  Musculoskeletal: Negative for back pain.  Skin: Positive for rash.  Neurological: Negative for headaches.  Hematological: Does not bruise/bleed easily.  Psychiatric/Behavioral: Negative for confusion.      Allergies  Ativan; Dilaudid; and Haloperidol decanoate  Home Medications   Prior to Admission medications   Medication Sig Start Date End Date Taking? Authorizing Provider  ALPRAZolam Prudy Feeler(XANAX) 1 MG tablet Take 1 tablet (1 mg total) by mouth 3 (three) times daily as needed for  anxiety. For anxiety 05/15/12  Yes Kathlen Mody, MD  amLODipine (NORVASC) 5 MG tablet Take 5 mg by mouth daily. 08/06/14  Yes Historical Provider, MD  aspirin 81 MG tablet Take 1 tablet (81 mg total) by mouth daily. 05/31/12  Yes Kathlen Mody, MD  atorvastatin (LIPITOR) 10 MG tablet TAKE 1 TABLET DAILY AT 6:00PM. 08/06/14  Yes Jonelle Sidle, MD  budesonide-formoterol [facility]) 160-4.5 MCG/ACT inhaler Inhale 2 puffs into the lungs 2 (two) times daily.   Yes Historical Provider, MD  citalopram (CELEXA) 40 MG tablet Take 40 mg by mouth daily.     Yes Historical Provider, MD  doxepin (SINEQUAN) 25  MG capsule Take 25 mg by mouth at bedtime.  08/06/14  Yes Historical Provider, MD  furosemide (LASIX) 40 MG tablet Take 1 tablet (40 mg total) by mouth daily. 12/06/13  Yes Catarina Hartshorn, MD  hydrochlorothiazide (HYDRODIURIL) 25 MG tablet Take 25 mg by mouth daily. 08/06/14  Yes Historical Provider, MD  ipratropium-albuterol (DUONEB) 0.5-2.5 (3) MG/3ML SOLN Take 3 mLs by nebulization every 6 (six) hours as needed (shortness of breath/ wheezing.).    Yes Historical Provider, MD  isosorbide mononitrate (IMDUR) 30 MG 24 hr tablet Take 1 tablet (30 mg total) by mouth daily. 12/06/13  Yes Catarina Hartshorn, MD  meloxicam (MOBIC) 15 MG tablet Take 1 tablet by mouth daily. 08/13/13  Yes Historical Provider, MD  metoprolol succinate (TOPROL-XL) 25 MG 24 hr tablet Take 1 tablet (25 mg total) by mouth daily. 12/07/13  Yes Catarina Hartshorn, MD  nitroGLYCERIN (NITROSTAT) 0.4 MG SL tablet Place 0.4 mg under the tongue every 5 (five) minutes as needed. For chest pain   Yes Historical Provider, MD  omeprazole (PRILOSEC) 20 MG capsule Take 20 mg by mouth daily.   Yes Historical Provider, MD  oxyCODONE-acetaminophen (PERCOCET) 10-325 MG per tablet Take 1 tablet by mouth every 6 (six) hours as needed for pain.  05/22/12  Yes Donnetta Hutching, MD  potassium chloride SA (K-DUR,KLOR-CON) 20 MEQ tablet Take 1 tablet by mouth daily. 08/13/13  Yes Historical Provider, MD  traZODone (DESYREL) 100 MG tablet Take 100 mg by mouth at bedtime.    Yes Historical Provider, MD  predniSONE (DELTASONE) 20 MG tablet Take 3 tablets (60 mg total) by mouth 2 (two) times daily with a meal. Take 3 tablets two times daily x 2 days; then 2 tablets (40mg ) two times daily x 2 days, then 1 tablet (20mg ) two times a day x 2 days Patient not taking: Reported on 08/08/2014 12/06/13   Catarina Hartshorn, MD  PROAIR HFA 108 (90 BASE) MCG/ACT inhaler Inhale 2 puffs into the lungs daily as needed for wheezing or shortness of breath.  06/20/13   Historical Provider, MD   BP 120/83 mmHg   Pulse 85  Temp(Src) 97.8 F (36.6 C) (Oral)  Resp 24  SpO2 94% Physical Exam  Constitutional: He is oriented to person, place, and time. He appears well-developed and well-nourished. No distress.  HENT:  Head: Normocephalic and atraumatic.  Mouth/Throat: Oropharynx is clear and moist.  Eyes: Conjunctivae and EOM are normal. Pupils are equal, round, and reactive to light.  Neck: Normal range of motion. Neck supple.  Cardiovascular: Normal rate and normal heart sounds.   No murmur heard. Pulmonary/Chest: Effort normal and breath sounds normal. No respiratory distress. He has no wheezes.  Abdominal: Soft. Bowel sounds are normal. There is no tenderness.  Musculoskeletal: Normal range of motion. He exhibits edema.  Neurological: He is alert and  oriented to person, place, and time. No cranial nerve deficit. He exhibits normal muscle tone. Coordination normal.  Skin: Skin is warm. No rash noted.  Nursing note and vitals reviewed.   ED Course  Procedures (including critical care time) Labs Review Labs Reviewed  COMPREHENSIVE METABOLIC PANEL - Abnormal; Notable for the following:    Potassium 3.4 (*)    Glucose, Bld 128 (*)    GFR calc non Af Amer 79 (*)    All other components within normal limits  CBC WITH DIFFERENTIAL - Abnormal; Notable for the following:    Monocytes Absolute 1.1 (*)    Eosinophils Relative 7 (*)    All other components within normal limits  TROPONIN I  PRO B NATRIURETIC PEPTIDE   Results for orders placed or performed during the hospital encounter of 08/08/14  Troponin I  Result Value Ref Range   Troponin I <0.30 <0.30 ng/mL  Pro b natriuretic peptide (BNP)  Result Value Ref Range   Pro B Natriuretic peptide (BNP) 27.9 0 - 125 pg/mL  Comprehensive metabolic panel  Result Value Ref Range   Sodium 138 137 - 147 mEq/L   Potassium 3.4 (L) 3.7 - 5.3 mEq/L   Chloride 98 96 - 112 mEq/L   CO2 26 19 - 32 mEq/L   Glucose, Bld 128 (H) 70 - 99 mg/dL   BUN 13 6 -  23 mg/dL   Creatinine, Ser 1.61 0.50 - 1.35 mg/dL   Calcium 9.5 8.4 - 09.6 mg/dL   Total Protein 7.5 6.0 - 8.3 g/dL   Albumin 3.7 3.5 - 5.2 g/dL   AST 18 0 - 37 U/L   ALT 19 0 - 53 U/L   Alkaline Phosphatase 59 39 - 117 U/L   Total Bilirubin 0.4 0.3 - 1.2 mg/dL   GFR calc non Af Amer 79 (L) >90 mL/min   GFR calc Af Amer >90 >90 mL/min   Anion gap 14 5 - 15  CBC with Differential  Result Value Ref Range   WBC 10.1 4.0 - 10.5 K/uL   RBC 4.39 4.22 - 5.81 MIL/uL   Hemoglobin 14.2 13.0 - 17.0 g/dL   HCT 04.5 40.9 - 81.1 %   MCV 93.8 78.0 - 100.0 fL   MCH 32.3 26.0 - 34.0 pg   MCHC 34.5 30.0 - 36.0 g/dL   RDW 91.4 78.2 - 95.6 %   Platelets 232 150 - 400 K/uL   Neutrophils Relative % 55 43 - 77 %   Neutro Abs 5.5 1.7 - 7.7 K/uL   Lymphocytes Relative 26 12 - 46 %   Lymphs Abs 2.7 0.7 - 4.0 K/uL   Monocytes Relative 11 3 - 12 %   Monocytes Absolute 1.1 (H) 0.1 - 1.0 K/uL   Eosinophils Relative 7 (H) 0 - 5 %   Eosinophils Absolute 0.7 0.0 - 0.7 K/uL   Basophils Relative 1 0 - 1 %   Basophils Absolute 0.1 0.0 - 0.1 K/uL     Imaging Review Dg Chest 2 View  08/08/2014   CLINICAL DATA:  Pt c/o CP with radiation into LT shoulder and upper extremity today. Intermittent SOB and dizziness. Hx COPD, HTN, GERD, CAD, smoker. Initial encounter.  EXAM: CHEST  2 VIEW  COMPARISON:  07/31/2014  FINDINGS: Hyperinflation. Lateral view degraded by patient arm position. Numerous leads and wires project over the chest. Midline trachea. Mild cardiomegaly. No pleural effusion or pneumothorax. Diffuse peribronchial thickening. Clear lungs.  IMPRESSION: COPD/chronic bronchitis.  No  acute superimposed process.  Cardiomegaly without congestive failure.   Electronically Signed   By: Jeronimo Greaves M.D.   On: 08/08/2014 19:04     EKG Interpretation None         Date: 08/08/2014  Rate: 91  Rhythm: normal sinus rhythm  QRS Axis: normal  Intervals: normal  ST/T Wave abnormalities: normal  Conduction  Disutrbances:none  Narrative Interpretation:   Old EKG Reviewed: none available    MDM   Final diagnoses:  Chest pain  SOB (shortness of breath)   Patient with significant history of chest pain starting at the 1:30 in the morning yesterday but then had a severe episode of chest pain that happened today at 12:30 while walking to the mailbox. Very exertional got intense we get the mailbox he had a sit and rest. Also associated with shortness of breath. Pain goes left chest and left arm. Patient did have cardiac cath in January 2015 with only mild to moderate disease but based on the symptoms would recommend the admission cardiac rule out. No wheezing do not think this is related to his COPD but he does have COPD. Patient also will get CTA to rule out pulmonary embolus. Not hypoxic here not on oxygen. Patient given for AV aspirin here given nitroglycerin given morphine with some improvement in the chest pain but not complete resolution. Chest x-ray shows enlarged heart without evidence of congestive heart failure or pneumonia.     Vanetta Mulders, MD 08/08/14 [phone]

## 2014-08-09 DIAGNOSIS — Q245 Malformation of coronary vessels: Secondary | ICD-10-CM

## 2014-08-09 DIAGNOSIS — I517 Cardiomegaly: Secondary | ICD-10-CM

## 2014-08-09 DIAGNOSIS — R079 Chest pain, unspecified: Secondary | ICD-10-CM

## 2014-08-09 DIAGNOSIS — Z716 Tobacco abuse counseling: Secondary | ICD-10-CM

## 2014-08-09 DIAGNOSIS — I10 Essential (primary) hypertension: Secondary | ICD-10-CM

## 2014-08-09 DIAGNOSIS — I25118 Atherosclerotic heart disease of native coronary artery with other forms of angina pectoris: Secondary | ICD-10-CM

## 2014-08-09 LAB — COMPREHENSIVE METABOLIC PANEL
ALBUMIN: 3.8 g/dL (ref 3.5–5.2)
ALT: 18 U/L (ref 0–53)
AST: 17 U/L (ref 0–37)
Alkaline Phosphatase: 59 U/L (ref 39–117)
Anion gap: 11 (ref 5–15)
BUN: 11 mg/dL (ref 6–23)
CALCIUM: 9.5 mg/dL (ref 8.4–10.5)
CHLORIDE: 100 meq/L (ref 96–112)
CO2: 26 mEq/L (ref 19–32)
CREATININE: 0.99 mg/dL (ref 0.50–1.35)
GFR calc Af Amer: >90 mL/min (ref >90)
GFR, EST NON AFRICAN AMERICAN: 90 mL/min — AB (ref >90)
Glucose, Bld: 170 mg/dL — ABNORMAL HIGH (ref 70–99)
Potassium: 4.4 mEq/L (ref 3.7–5.3)
SODIUM: 137 meq/L (ref 137–147)
Total Bilirubin: 0.3 mg/dL (ref 0.3–1.2)
Total Protein: 7.8 g/dL (ref 6.0–8.3)

## 2014-08-09 LAB — TROPONIN I: Troponin I: <0.3 ng/mL (ref <0.30)

## 2014-08-09 LAB — TSH: TSH: 1.18 u[IU]/mL (ref 0.350–4.500)

## 2014-08-09 LAB — PRO B NATRIURETIC PEPTIDE: Pro B Natriuretic peptide (BNP): 29.7 pg/mL (ref 0–125)

## 2014-08-09 MED ORDER — ALBUTEROL SULFATE (2.5 MG/3ML) 0.083% IN NEBU: 3.0000 mL | INHALATION_SOLUTION | Freq: Four times a day (QID) | RESPIRATORY_TRACT | Status: DC | PRN

## 2014-08-09 MED ORDER — ISOSORBIDE MONONITRATE ER 60 MG PO TB24
30.0000 mg | ORAL_TABLET | Freq: Two times a day (BID) | ORAL | Status: DC
  Administered 2014-08-09 – 2014-08-12 (×7): 30 mg via ORAL
  Filled 2014-08-09 (×7): qty 1

## 2014-08-09 MED ORDER — PREDNISONE 20 MG PO TABS: 50.0000 mg | ORAL_TABLET | Freq: Every day | ORAL | Status: DC

## 2014-08-09 NOTE — Consult Note (Signed)
CARDIOLOGY CONSULT NOTE  Patient ID: [name] MRN: [id number] DOB/AGE: 56-Jul-1957 56 y.o.  Admit date: 08/08/2014 Primary Physician Lia Hopping A, MD  Reason for Consultation: chest pain, CAD  HPI: The patient is a 58 yr old male with a history of CAD, morbid obesity, hypertension, and tobacco abuse. Diagnostic cardiac catheterization on 09/12/13 revealed moderate CAD that was managed medically with a 30% ostial/proximal LAD stenosis, diffuse 30% proximal LCx stenosis with 50% mid-vessel stenosis at the takeoff of the OM3, 50% ostial stenosis in a small caliber OM 3, 30% stenosis in a moderate caliber ramus intermedius branch, 40% ostial RCA stenosis with distal 70% stenosis of the PL branch, and normal LV function. 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 He then followed up with Dr. Diona Browner and has done so since that time. He was subsequently hospitalized for syncope, lower extremity swelling, and musculoskeletal chest pain.  He was admitted by the hospitalist service last night with a 2-day history of chest pain which was intermittent, located on the left side of his chest, with radiation down the left arm. It was associated with shortness of breath. He said it was the worst pain he has ever experienced. He said he was walking to his mailbox and after closing it, experienced significant left precordial pain which radiated down this left arm. He took Percocet which did not relieve it. It finally abated and he went to sleep, and was awoken by similar pain which he felt from the top of his head to the bottom of his feet. He then had his wife drive him to the ED. He continues to smoke 3-4  cigarettes daily as a form of appetite suppression. He has had several siblings pass away from heart attacks within the past two years.  ECG demonstrated normal sinus rhythm with no ischemic ST-T abnormalities. Chest xray showed COPD/chronic bronchitis with cardiomegaly and no CHF. CT angiography of the chest showed no evidence for pulmonary embolism. BMET showed mild hypokalemia (3.4) which has since been corrected. CBC, TSH, and BNP were normal. He has since ruled out for an acute coronary syndrome with normal troponins.  He said morphine has helped the pain and continues to request "something strong" for pain relief. He had an echocardiogram performed earlier which has yet to be uploaded into the system.      Allergies  Allergen Reactions  . Ativan [Lorazepam] Other (See Comments)    "out of mind"  . Dilaudid [Hydromorphone Hcl] Other (See Comments)    " out of mind"  . Haloperidol Decanoate     Current Facility-Administered Medications  Medication Dose Route Frequency Provider Last Rate Last Dose  . albuterol (PROVENTIL) (2.5 MG/3ML) 0.083% nebulizer solution 3 mL  3 mL Inhalation Daily PRN Nimish Normajean Glasgow, MD      . ALPRAZolam Prudy Feeler) tablet 1 mg  1 mg Oral TID PRN Wilson Singer, MD   1 mg at 08/08/14 2202  . amLODipine (NORVASC) tablet 5 mg  5 mg Oral Daily Nimish Normajean Glasgow, MD   5 mg at 08/09/14 0817  . aspirin chewable tablet 81 mg  81 mg Oral Daily Nimish C Gosrani,  MD   81 mg at 08/09/14 0817  . atorvastatin (LIPITOR) tablet 10 mg  10 mg Oral q1800 Wilson Singer, MD   10 mg at 08/08/14 2215  . budesonide-formoterol (SYMBICORT) 160-4.5 MCG/ACT inhaler 2 puff  2 puff Inhalation BID Wilson Singer, MD   2 puff at 08/09/14 1029  . citalopram (CELEXA) tablet 40 mg  40 mg Oral Daily Wilson Singer, MD   40 mg at 08/09/14 0816  . doxepin (SINEQUAN) capsule 25 mg  25 mg Oral QHS Nimish C Gosrani, MD   25 mg at 08/08/14 2200  . furosemide (LASIX) tablet 40 mg  40  mg Oral Daily Nimish C Gosrani, MD   40 mg at 08/09/14 0816  . heparin injection 5,000 Units  5,000 Units Subcutaneous 3 times per day Wilson Singer, MD   5,000 Units at 08/09/14 1448  . hydrochlorothiazide (HYDRODIURIL) tablet 25 mg  25 mg Oral Daily Wilson Singer, MD   25 mg at 08/09/14 0817  . ipratropium-albuterol (DUONEB) 0.5-2.5 (3) MG/3ML nebulizer solution 3 mL  3 mL Nebulization Q6H PRN Nimish C Gosrani, MD      . isosorbide mononitrate (IMDUR) 24 hr tablet 30 mg  30 mg Oral Daily Nimish C Karilyn Cota, MD   30 mg at 08/09/14 0818  . meloxicam (MOBIC) tablet 15 mg  15 mg Oral Daily Wilson Singer, MD   15 mg at 08/09/14 0914  . methylPREDNISolone sodium succinate (SOLU-MEDROL) 125 mg/2 mL injection 80 mg  80 mg Intravenous Q24H Nimish C Karilyn Cota, MD   80 mg at 08/08/14 2305  . metoprolol succinate (TOPROL-XL) 24 hr tablet 25 mg  25 mg Oral Daily Wilson Singer, MD   25 mg at 08/09/14 0816  . morphine 4 MG/ML injection 4 mg  4 mg Intravenous Q4H PRN Nimish C Gosrani, MD      . nitroGLYCERIN (NITROSTAT) SL tablet 0.4 mg  0.4 mg Sublingual Q5 Min x 3 PRN Vanetta Mulders, MD   0.4 mg at 08/08/14 1905  . ondansetron (ZOFRAN) tablet 4 mg  4 mg Oral Q6H PRN Nimish Normajean Glasgow, MD       Or  . ondansetron (ZOFRAN) injection 4 mg  4 mg Intravenous Q6H PRN Nimish C Gosrani, MD      . oxyCODONE-acetaminophen (PERCOCET/ROXICET) 5-325 MG per tablet 1 tablet  1 tablet Oral Q6H PRN Wilson Singer, MD   1 tablet at 08/09/14 1454   And  . oxyCODONE (Oxy IR/ROXICODONE) immediate release tablet 5 mg  5 mg Oral Q6H PRN Wilson Singer, MD   5 mg at 08/09/14 1448  . pantoprazole (PROTONIX) EC tablet 40 mg  40 mg Oral Daily Wilson Singer, MD   40 mg at 08/09/14 0818  . potassium chloride SA (K-DUR,KLOR-CON) CR tablet 20 mEq  20 mEq Oral Daily Wilson Singer, MD   20 mEq at 08/09/14 0818  . sodium chloride 0.9 % injection 3 mL  3 mL Intravenous Q12H Nimish C Gosrani, MD   3 mL at 08/09/14 1507  .  sodium chloride 0.9 % injection 3 mL  3 mL Intravenous Q12H Nimish C Gosrani, MD   3 mL at 08/09/14 1507  . traZODone (DESYREL) tablet 100 mg  100 mg Oral QHS Wilson Singer, MD   100 mg at 08/08/14 2200    Past Medical History  Diagnosis Date  . COPD (chronic obstructive pulmonary disease)   . Essential hypertension, benign   .  Chronic back pain   . Anxiety disorder   . Hyperlipidemia   . Depression   . Obesity   . History of alcohol abuse   . History of cocaine abuse   . Pneumonia   . GERD (gastroesophageal reflux disease)   . Headache(784.0)   . Arthritis   . CAD (coronary artery disease)     a. ~ 2000 cath at [facility];  b. 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    Past Surgical History  Procedure Laterality Date  . Hip surgery      rt  . Cholecystectomy    . Back surgery    . Nasal/sinus endoscopy    . Total hip arthroplasty  05/08/2012    Procedure: TOTAL HIP ARTHROPLASTY;  Surgeon: Nestor Lewandowsky, MD;  Location: [facility] OR;  Service: Orthopedics;  Laterality: Left;  . Joint replacement    . Left heart catheterization with coronary angiogram N/A 09/12/2013    Procedure: LEFT HEART CATHETERIZATION WITH CORONARY ANGIOGRAM;  Surgeon: Kathleene Hazel, MD;  Location: [facility] CATH LAB;  Service: Cardiovascular;  Laterality: N/A;    History   Social History  . Marital Status: Married    Spouse Name: N/A    Number of Children: N/A  . Years of Education: N/A   Occupational History  . UNEMPLOYED    Social History Main Topics  . Smoking status: Current Every Day Smoker -- 0.25 packs/day for 39 years    Types: Cigarettes  . Smokeless tobacco: Never Used  . Alcohol Use: No     Comment: denies  . Drug Use: No  . Sexual Activity: Not on file   Other Topics Concern  . Not on file   Social History Narrative     Fam: Several  siblings passed away from MI's.  Prior to Admission medications   Medication Sig Start Date End Date Taking? Authorizing Provider  ALPRAZolam Prudy Feeler) 1 MG tablet Take 1 tablet (1 mg total) by mouth 3 (three) times daily as needed for anxiety. For anxiety 05/15/12  Yes Kathlen Mody, MD  amLODipine (NORVASC) 5 MG tablet Take 5 mg by mouth daily. 08/06/14  Yes Historical Provider, MD  aspirin 81 MG tablet Take 1 tablet (81 mg total) by mouth daily. 05/31/12  Yes Kathlen Mody, MD  atorvastatin (LIPITOR) 10 MG tablet TAKE 1 TABLET DAILY AT 6:00PM. 08/06/14  Yes Jonelle Sidle, MD  budesonide-formoterol [facility]) 160-4.5 MCG/ACT inhaler Inhale 2 puffs into the lungs 2 (two) times daily.   Yes Historical Provider, MD  citalopram (CELEXA) 40 MG tablet Take 40 mg by mouth daily.     Yes Historical Provider, MD  doxepin (SINEQUAN) 25 MG capsule Take 25 mg by mouth at bedtime.  08/06/14  Yes Historical Provider, MD  furosemide (LASIX) 40 MG tablet Take 1 tablet (40 mg total) by mouth daily. 12/06/13  Yes Catarina Hartshorn, MD  hydrochlorothiazide (HYDRODIURIL) 25 MG tablet Take 25 mg by mouth daily. 08/06/14  Yes Historical Provider, MD  ipratropium-albuterol (DUONEB) 0.5-2.5 (3) MG/3ML SOLN Take 3 mLs by nebulization every 6 (six) hours as needed (shortness of breath/ wheezing.).    Yes Historical Provider, MD  isosorbide mononitrate (IMDUR) 30 MG 24 hr tablet Take 1 tablet (30 mg total) by mouth daily. 12/06/13  Yes Catarina Hartshorn, MD  meloxicam (MOBIC) 15 MG  tablet Take 1 tablet by mouth daily. 08/13/13  Yes Historical Provider, MD  metoprolol succinate (TOPROL-XL) 25 MG 24 hr tablet Take 1 tablet (25 mg total) by mouth daily. 12/07/13  Yes Catarina Hartshorn, MD  nitroGLYCERIN (NITROSTAT) 0.4 MG SL tablet Place 0.4 mg under the tongue every 5 (five) minutes as needed. For chest pain   Yes Historical Provider, MD  omeprazole (PRILOSEC) 20 MG capsule Take 20 mg by mouth daily.   Yes Historical Provider, MD    oxyCODONE-acetaminophen (PERCOCET) 10-325 MG per tablet Take 1 tablet by mouth every 6 (six) hours as needed for pain.  05/22/12  Yes Donnetta Hutching, MD  potassium chloride SA (K-DUR,KLOR-CON) 20 MEQ tablet Take 1 tablet by mouth daily. 08/13/13  Yes Historical Provider, MD  traZODone (DESYREL) 100 MG tablet Take 100 mg by mouth at bedtime.    Yes Historical Provider, MD  predniSONE (DELTASONE) 20 MG tablet Take 3 tablets (60 mg total) by mouth 2 (two) times daily with a meal. Take 3 tablets two times daily x 2 days; then 2 tablets (40mg ) two times daily x 2 days, then 1 tablet (20mg ) two times a day x 2 days Patient not taking: Reported on 08/08/2014 12/06/13   Catarina Hartshorn, MD  PROAIR HFA 108 (90 BASE) MCG/ACT inhaler Inhale 2 puffs into the lungs daily as needed for wheezing or shortness of breath.  06/20/13   Historical Provider, MD     Review of systems complete and found to be negative unless listed above in HPI     Physical exam Blood pressure 122/57, pulse 78, temperature 97.7 F (36.5 C), temperature source Oral, resp. rate 16, height 6\' 3"  (1.905 m), weight 357 lb 3.2 oz (162.025 kg), SpO2 95 %. General: NAD, morbidly obese Neck: Difficult to assess JVP due to habitus. Lungs: Prolonged expiratory phase with expiratory wheezes, no rales. CV: Distant heart tones. Regular rate and rhythm, normal S1/S2, no S3/S4, no murmur.  No peripheral edema.    Abdomen: Morbidly obese.  Skin: Intact without lesions or rashes.  Neurologic: Alert and oriented x 3.  Psych: Normal affect. Extremities: No clubbing or cyanosis.  HEENT: Normal.   ECG: Most recent ECG reviewed.  Labs:   Lab Results  Component Value Date   WBC 10.1 08/08/2014   HGB 14.2 08/08/2014   HCT 41.2 08/08/2014   MCV 93.8 08/08/2014   PLT 232 08/08/2014    Recent Labs Lab 08/09/14 0314  NA 137  K 4.4  CL 100  CO2 26  BUN 11  CREATININE 0.99  CALCIUM 9.5  PROT 7.8  BILITOT 0.3  ALKPHOS 59  ALT 18  AST 17   GLUCOSE 170*   Lab Results  Component Value Date   CKTOTAL 217 02/25/2011   CKMB 3.3 02/25/2011   TROPONINI <0.30 08/09/2014    Lab Results  Component Value Date   CHOL 157 09/14/2013   Lab Results  Component Value Date   HDL 25* 09/14/2013   Lab Results  Component Value Date   LDLCALC 71 09/14/2013   Lab Results  Component Value Date   TRIG 305* 09/14/2013   Lab Results  Component Value Date   CHOLHDL 6.3 09/14/2013   Lab Results  Component Value Date   LDLDIRECT 105* 01/19/2007         Studies: Dg Chest 2 View  08/08/2014   CLINICAL DATA:  Pt c/o CP with radiation into LT shoulder and upper extremity today. Intermittent SOB and dizziness. Hx  COPD, HTN, GERD, CAD, smoker. Initial encounter.  EXAM: CHEST  2 VIEW  COMPARISON:  07/31/2014  FINDINGS: Hyperinflation. Lateral view degraded by patient arm position. Numerous leads and wires project over the chest. Midline trachea. Mild cardiomegaly. No pleural effusion or pneumothorax. Diffuse peribronchial thickening. Clear lungs.  IMPRESSION: COPD/chronic bronchitis.  No acute superimposed process.  Cardiomegaly without congestive failure.   Electronically Signed   By: Jeronimo GreavesKyle  Talbot M.D.   On: 08/08/2014 19:04   Ct Angio Chest Pe W/cm &/or Wo Cm  08/08/2014   CLINICAL DATA:  Dyspnea on exertion  EXAM: CT ANGIOGRAPHY CHEST WITH CONTRAST  TECHNIQUE: Multidetector CT imaging of the chest was performed using the standard protocol during bolus administration of intravenous contrast. Multiplanar CT image reconstructions and MIPs were obtained to evaluate the vascular anatomy.  CONTRAST:  120mL OMNIPAQUE IOHEXOL 350 MG/ML SOLN  COMPARISON:  Plain film from earlier in the same day  FINDINGS: The lungs are well aerated bilaterally. No focal infiltrate or sizable effusion is seen. Mild emphysematous changes are noted.  The thoracic inlet is within normal limits. The thoracic aorta in its branches are unremarkable. Note is made of a left  vertebral origin from the aortic arch. Pulmonary artery is well visualized and demonstrates a normal branching pattern. No definitive pulmonary emboli are noted.  There are right hilar lymph nodes which appear relatively stable from the prior exam and likely of a benign etiology. A few paraesophageal lymph nodes are noted as well lying between the esophagus and the right bronchus intermedius. Scanning into the upper abdomen reveals no acute abnormality. The osseous structures are within normal limits for the patient's given age.  Review of the MIP images confirms the above findings.  IMPRESSION: No evidence of pulmonary emboli.  Stable right hilar and mediastinal lymph nodes when compared with the prior exam.  No acute abnormality is noted.   Electronically Signed   By: Alcide CleverMark  Lukens M.D.   On: 08/08/2014 20:51    ASSESSMENT AND PLAN:  1. Chest pain in the context of nonobstructive CAD: Symptoms have both typical and atypical features for CAD. Has ruled out for an acute coronary syndrome. He may be experiencing some form of vasospastic angina due to endothelial dysfunction, although I have not seen any dynamic ECG changes to support this. I have strongly emphasized tobacco cessation. BP is normal. I will thus increase Imdur to 30 mg bid. He continues to ask for "something very strong to take away the pain" and has mentioned both Percocet and morphine, and I wonder if there is any underlying secondary gain occurring. Continue ASA, Lipitor, and metoprolol. If long-acting nitrates fail to control the pain, Ranexa could be considered. At this time, I see no indication for coronary angiography. I will review the echocardiogram once it has been uploaded into the system. 2. Essential HTN: Well controlled on current therapy. No changes. 3. Hyperlipidemia: Continue Lipitor. 4. Tobacco abuse: Cessation counseling provided. 5. COPD exacerbation: [place] SpanielSaid he "felt like a new person" after receiving IV Solu-Medrol. Continue  albuterol, Symbicort, and DuoNebs, as per hospitalist service.   Signed: Prentice DockerSuresh Koneswaran, M.D., F.A.C.C.  08/09/2014, 3:12 PM

## 2014-08-09 NOTE — Care Management Note (Signed)
    Page 1 of 1   08/09/2014     9:39:27 AM CARE MANAGEMENT NOTE 08/09/2014  Patient:  BRIX, HAJEK   Account Number:  [id number]  Date Initiated:  08/09/2014  Documentation initiated by:  Kathyrn Sheriff  Subjective/Objective Assessment:   Pt is from home with self care. Pt uses a walker but has no Med needs or HH services prior to admission. Pt says he was recently approved for a "machine you wear when you sleep" but has not gotten it yet.     Action/Plan:   Pt does not know if it was a BIPAP or CPAP. Pt plans to dsicharge home with self care. No CM needs at this time.   Anticipated DC Date:  08/10/2014   Anticipated DC Plan:  HOME/SELF CARE      DC Planning Services  CM consult      Choice offered to / List presented to:             Status of service:  Completed, signed off Medicare Important Message given?   (If response is "NO", the following Medicare IM given date fields will be blank) Date Medicare IM given:   Medicare IM given by:   Date Additional Medicare IM given:   Additional Medicare IM given by:    Discharge Disposition:  HOME/SELF CARE  Per UR Regulation:    If discussed at Long Length of Stay Meetings, dates discussed:    Comments:  08/09/2014 0930 Kathyrn Sheriff, RN, MSN, [facility]

## 2014-08-09 NOTE — Progress Notes (Signed)
UR completed 

## 2014-08-09 NOTE — Progress Notes (Signed)
  Echocardiogram 2D Echocardiogram has been performed.  [name] 08/09/2014, 4:45 PM

## 2014-08-09 NOTE — Progress Notes (Signed)
[facility] PROGRESS NOTE  [name] ZOX:[id number] DOB: May 10, 1956 DOA: 08/08/2014 PCP: Toma DeitersHASANAJ,[name], MD  Assessment/Plan: Chest pain with hx  Of  CAD Serial troponins negative.. No EKG changes. 2D echo with normal EF and no wall motion abnormality. Cardiac  cath earlier this year with moderate CAD managed medically.  Has hx of nonobstructive disease.  Seen by cardiology. Feel he this could be both cardiac and non cardiac and can have vasospastic angina. Increased imdur to 30 mg twice daily.  continue ASA, statin and  BB. No need for cardiac cath at this time.   HTN  stable. Continue current meds  Chest and back pain  on prn percocet. Reports not controlled with it. Added prn morphine  Hyperlipidemia  continue statin  COPD with mild  bronchospasm Switch to oral prednisone and prn nebs. continue home inhalers. counseled strongly on smoking cessation.   DVT prophylaxis: sq heparin  Diet: cardiac  Code Status: full code Family Communication: none at  bedisde Disposition Plan: home possibly in am if pain stable   Consultants:  cardiology  Procedures:  2d echo  Antibiotics:  none  HPI/Subjective: Seen and examined . C/o off ad on pain in his arms, chest ad back not improved with percocet  Objective: Filed Vitals:   08/09/14 1451  BP: 122/57  Pulse: 78  Temp:   Resp:     Intake/Output Summary (Last 24 hours) at 08/09/14 1727 Last data filed at 08/09/14 1515  Gross per 24 hour  Intake    480 ml  Output   1725 ml  Net  -1245 ml   Filed Weights   08/09/14 1121  Weight: 162.025 kg (357 lb 3.2 oz)    Exam:   General:  Middle aged male in NAD   HEENT: no pallor, moist mucosa  Cardiovascular: NS1&S2, no murmurs  Respiratory: clear b/l, no added sounds  Abdomen: soft, NT, ND, BS+  Ext: warm, no edema   Data Reviewed: Basic Metabolic Panel:  Recent Labs Lab 08/08/14 1755 08/09/14 0314  NA 138 137  K 3.4* 4.4  CL 98 100  CO2  26 26  GLUCOSE 128* 170*  BUN 13 11  CREATININE 1.03 0.99  CALCIUM 9.5 9.5   Liver Function Tests:  Recent Labs Lab 08/08/14 1755 08/09/14 0314  AST 18 17  ALT 19 18  ALKPHOS 59 59  BILITOT 0.4 0.3  PROT 7.5 7.8  ALBUMIN 3.7 3.8   No results for input(s): LIPASE, AMYLASE in the last 168 hours. No results for input(s): AMMONIA in the last 168 hours. CBC:  Recent Labs Lab 08/08/14 1755  WBC 10.1  NEUTROABS 5.5  HGB 14.2  HCT 41.2  MCV 93.8  PLT 232   Cardiac Enzymes:  Recent Labs Lab 08/08/14 1755 08/08/14 2141 08/09/14 0314 08/09/14 0851  TROPONINI <0.30 <0.30 <0.30 <0.30   BNP (last 3 results)  Recent Labs  12/05/13 0146 08/08/14 1755 08/09/14 0314  PROBNP 21.1 27.9 29.7   CBG: No results for input(s): GLUCAP in the last 168 hours.  No results found for this or any previous visit (from the past 240 hour(s)).   Studies: Dg Chest 2 View  08/08/2014   CLINICAL DATA:  Pt c/o CP with radiation into LT shoulder and upper extremity today. Intermittent SOB and dizziness. Hx COPD, HTN, GERD, CAD, smoker. Initial encounter.  EXAM: CHEST  2 VIEW  COMPARISON:  07/31/2014  FINDINGS: Hyperinflation. Lateral view degraded by patient arm position. Numerous leads and  wires project over the chest. Midline trachea. Mild cardiomegaly. No pleural effusion or pneumothorax. Diffuse peribronchial thickening. Clear lungs.  IMPRESSION: COPD/chronic bronchitis.  No acute superimposed process.  Cardiomegaly without congestive failure.   Electronically Signed   By: Jeronimo Greaves M.D.   On: 08/08/2014 19:04   Ct Angio Chest Pe W/cm &/or Wo Cm  08/08/2014   CLINICAL DATA:  Dyspnea on exertion  EXAM: CT ANGIOGRAPHY CHEST WITH CONTRAST  TECHNIQUE: Multidetector CT imaging of the chest was performed using the standard protocol during bolus administration of intravenous contrast. Multiplanar CT image reconstructions and MIPs were obtained to evaluate the vascular anatomy.  CONTRAST:   OMNIPAQUE IOHEXOL 350 MG/ML SOLN  COMPARISON:  Plain film from earlier in the same day  FINDINGS: The lungs are well aerated bilaterally. No focal infiltrate or sizable effusion is seen. Mild emphysematous changes are noted.  The thoracic inlet is within normal limits. The thoracic aorta in its branches are unremarkable. Note is made of a left vertebral origin from the aortic arch. Pulmonary artery is well visualized and demonstrates a normal branching pattern. No definitive pulmonary emboli are noted.  There are right hilar lymph nodes which appear relatively stable from the prior exam and likely of a benign etiology. A few paraesophageal lymph nodes are noted as well lying between the esophagus and the right bronchus intermedius. Scanning into the upper abdomen reveals no acute abnormality. The osseous structures are within normal limits for the patient's given age.  Review of the MIP images confirms the above findings.  IMPRESSION: No evidence of pulmonary emboli.  Stable right hilar and mediastinal lymph nodes when compared with the prior exam.  No acute abnormality is noted.   Electronically Signed   By: Alcide Clever M.D.   On: 08/08/2014 20:51    Scheduled Meds: . amLODipine  5 mg Oral Daily  . aspirin  81 mg Oral Daily  . atorvastatin  10 mg Oral q1800  . budesonide-formoterol  2 puff Inhalation BID  . citalopram  40 mg Oral Daily  . doxepin  25 mg Oral QHS  . furosemide  40 mg Oral Daily  . heparin  5,000 Units Subcutaneous 3 times per day  . hydrochlorothiazide  25 mg Oral Daily  . isosorbide mononitrate  30 mg Oral BID  . meloxicam  15 mg Oral Daily  . methylPREDNISolone (SOLU-MEDROL) injection  80 mg Intravenous Q24H  . metoprolol succinate  25 mg Oral Daily  . pantoprazole  40 mg Oral Daily  . potassium chloride SA  20 mEq Oral Daily  . sodium chloride  3 mL Intravenous Q12H  . sodium chloride  3 mL Intravenous Q12H  . traZODone  100 mg Oral QHS   Continuous Infusions:       Time spent: 25 minutes    Karissa Meenan  [facility] Pager [phone] 7PM-7AM, please contact night-coverage at [email or web address], password [facility] 08/09/2014, 5:27 PM  LOS: 1 day

## 2014-08-10 ENCOUNTER — Observation Stay (HOSPITAL_COMMUNITY): Payer: Medicare HMO

## 2014-08-10 DIAGNOSIS — F419 Anxiety disorder, unspecified: Secondary | ICD-10-CM | POA: Diagnosis present

## 2014-08-10 DIAGNOSIS — I2510 Atherosclerotic heart disease of native coronary artery without angina pectoris: Secondary | ICD-10-CM | POA: Diagnosis present

## 2014-08-10 DIAGNOSIS — F329 Major depressive disorder, single episode, unspecified: Secondary | ICD-10-CM | POA: Diagnosis present

## 2014-08-10 DIAGNOSIS — Z96642 Presence of left artificial hip joint: Secondary | ICD-10-CM | POA: Diagnosis present

## 2014-08-10 DIAGNOSIS — M1990 Unspecified osteoarthritis, unspecified site: Secondary | ICD-10-CM | POA: Diagnosis present

## 2014-08-10 DIAGNOSIS — Z79899 Other long term (current) drug therapy: Secondary | ICD-10-CM | POA: Diagnosis not present

## 2014-08-10 DIAGNOSIS — Z7952 Long term (current) use of systemic steroids: Secondary | ICD-10-CM | POA: Diagnosis not present

## 2014-08-10 DIAGNOSIS — E785 Hyperlipidemia, unspecified: Secondary | ICD-10-CM | POA: Diagnosis present

## 2014-08-10 DIAGNOSIS — E119 Type 2 diabetes mellitus without complications: Secondary | ICD-10-CM | POA: Diagnosis present

## 2014-08-10 DIAGNOSIS — R112 Nausea with vomiting, unspecified: Secondary | ICD-10-CM | POA: Diagnosis present

## 2014-08-10 DIAGNOSIS — F17210 Nicotine dependence, cigarettes, uncomplicated: Secondary | ICD-10-CM | POA: Diagnosis present

## 2014-08-10 DIAGNOSIS — Z8249 Family history of ischemic heart disease and other diseases of the circulatory system: Secondary | ICD-10-CM | POA: Diagnosis not present

## 2014-08-10 DIAGNOSIS — J9801 Acute bronchospasm: Secondary | ICD-10-CM | POA: Diagnosis present

## 2014-08-10 DIAGNOSIS — Z6841 Body Mass Index (BMI) 40.0 and over, adult: Secondary | ICD-10-CM | POA: Diagnosis not present

## 2014-08-10 DIAGNOSIS — J209 Acute bronchitis, unspecified: Secondary | ICD-10-CM

## 2014-08-10 DIAGNOSIS — I10 Essential (primary) hypertension: Secondary | ICD-10-CM | POA: Diagnosis present

## 2014-08-10 DIAGNOSIS — Z7982 Long term (current) use of aspirin: Secondary | ICD-10-CM | POA: Diagnosis not present

## 2014-08-10 DIAGNOSIS — R079 Chest pain, unspecified: Secondary | ICD-10-CM | POA: Diagnosis present

## 2014-08-10 DIAGNOSIS — K219 Gastro-esophageal reflux disease without esophagitis: Secondary | ICD-10-CM | POA: Diagnosis present

## 2014-08-10 DIAGNOSIS — R001 Bradycardia, unspecified: Secondary | ICD-10-CM | POA: Diagnosis present

## 2014-08-10 DIAGNOSIS — J441 Chronic obstructive pulmonary disease with (acute) exacerbation: Secondary | ICD-10-CM | POA: Diagnosis present

## 2014-08-10 LAB — POTASSIUM: Potassium: 3.9 mEq/L (ref 3.7–5.3)

## 2014-08-10 LAB — MAGNESIUM: Magnesium: 1.9 mg/dL (ref 1.5–2.5)

## 2014-08-10 LAB — TROPONIN I
Troponin I: <0.3 ng/mL (ref <0.30)
Troponin I: <0.3 ng/mL (ref <0.30)

## 2014-08-10 MED ORDER — METHYLPREDNISOLONE SODIUM SUCC 125 MG IJ SOLR
125.0000 mg | Freq: Once | INTRAMUSCULAR | Status: AC
  Administered 2014-08-10: 125 mg via INTRAVENOUS

## 2014-08-10 MED ORDER — METHYLPREDNISOLONE SODIUM SUCC 125 MG IJ SOLR
60.0000 mg | Freq: Four times a day (QID) | INTRAMUSCULAR | Status: DC
  Administered 2014-08-10 – 2014-08-12 (×8): 60 mg via INTRAVENOUS
  Filled 2014-08-10 (×7): qty 2

## 2014-08-10 MED ORDER — METHYLPREDNISOLONE SODIUM SUCC 125 MG IJ SOLR
INTRAMUSCULAR | Status: AC
  Filled 2014-08-10: qty 2

## 2014-08-10 MED ORDER — FUROSEMIDE 10 MG/ML IJ SOLN
20.0000 mg | Freq: Two times a day (BID) | INTRAMUSCULAR | Status: DC
  Administered 2014-08-10 – 2014-08-11 (×3): 20 mg via INTRAVENOUS
  Filled 2014-08-10 (×4): qty 2

## 2014-08-10 MED ORDER — IPRATROPIUM-ALBUTEROL 0.5-2.5 (3) MG/3ML IN SOLN
3.0000 mL | Freq: Four times a day (QID) | RESPIRATORY_TRACT | Status: DC
  Administered 2014-08-10 – 2014-08-11 (×5): 3 mL via RESPIRATORY_TRACT
  Filled 2014-08-10 (×5): qty 3

## 2014-08-10 NOTE — Progress Notes (Signed)
[facility] PROGRESS NOTE  [name] ZOX:[id number] DOB: 12/09/1955 DOA: 08/08/2014 PCP: [name], MD  Assessment/Plan: Chest pain with hx  Of  CAD Serial troponins negative.. No EKG changes. 2D echo with normal EF and no wall motion abnormality. Cardiac  cath earlier this year with moderate CAD managed medically.  Has hx of nonobstructive disease.  Seen by cardiology. Feel he this could be both cardiac and non cardiac and can have vasospastic angina. Increased imdur to 30 mg twice daily.  continue ASA, statin. Hold Beta blocker given pause on telemetry. No need for cardiac cath at this time. Cardiology will reevaluate on Monday. -Pain control with Percocet and morphine when necessary.     Bradycardia with pause Patient had a 3.93 second pause on telemetry this morning followed by heart rate in the low 40s.  asymptomatic. Will hold metoprolol and monitor. Potassium and magnesium normal.     HTN  stable. Continue current meds  Hyperlipidemia  continue statin  COPD with mild  bronchospasm Patient short of breath and wheezy this morning. Switch to IV Solu-Medrol. Continue DuoNeb's and antitussives.  Tobacco abuse.  Counseled strongly on smoking cessation.  order nicotine patch  DVT prophylaxis: sq heparin  Diet: cardiac  Code Status: full code Family Communication: none at  bedisde Disposition Plan: will monitor over the weekend for improvement in pain and breathing. Further Cardiology eval on Monday    Consultants:  cardiology  Procedures:  2d echo  Antibiotics:  none  HPI/Subjective: Seen and examined . Still reports off-and-on chest pains and left arm pain. Troponin repeated this morning which was negative. Noted for 3. 9 sec pause on the monitor early this morning  Objective: Filed Vitals:   08/10/14 1433  BP: 121/54  Pulse: 74  Temp: 97.7 F (36.5 C)  Resp:     Intake/Output Summary (Last 24 hours) at 08/10/14 1639 Last data filed  at 08/10/14 1328  Gross per 24 hour  Intake    960 ml  Output   3350 ml  Net  -2390 ml   Filed Weights   08/09/14 1121  Weight: 162.025 kg (357 lb 3.2 oz)    Exam:   General:  Middle aged obese male in NAD   HEENT: no pallor, moist mucosa  Cardiovascular: NS1&S2, no murmurs  Respiratory: clear b/l, no added sounds  Abdomen: soft, NT, ND, BS+  Ext: warm, no edema   Data Reviewed: Basic Metabolic Panel:  Recent Labs Lab 08/08/14 1755 08/09/14 0314 08/10/14 0510  NA 138 137  --   K 3.4* 4.4 3.9  CL 98 100  --   CO2 26 26  --   GLUCOSE 128* 170*  --   BUN 13 11  --   CREATININE 1.03 0.99  --   CALCIUM 9.5 9.5  --   MG  --   --  1.9   Liver Function Tests:  Recent Labs Lab 08/08/14 1755 08/09/14 0314  AST 18 17  ALT 19 18  ALKPHOS 59 59  BILITOT 0.4 0.3  PROT 7.5 7.8  ALBUMIN 3.7 3.8   No results for input(s): LIPASE, AMYLASE in the last 168 hours. No results for input(s): AMMONIA in the last 168 hours. CBC:  Recent Labs Lab 08/08/14 1755  WBC 10.1  NEUTROABS 5.5  HGB 14.2  HCT 41.2  MCV 93.8  PLT 232   Cardiac Enzymes:  Recent Labs Lab 08/08/14 1755 08/08/14 2141 08/09/14 0314 08/09/14 0851 08/10/14 0510  TROPONINI <0.30 <  0.30 <0.30 <0.30 <0.30   BNP (last 3 results)  Recent Labs  12/05/13 0146 08/08/14 1755 08/09/14 0314  PROBNP 21.1 27.9 29.7   CBG: No results for input(s): GLUCAP in the last 168 hours.  No results found for this or any previous visit (from the past 240 hour(s)).   Studies: Dg Chest 1 View  08/10/2014   CLINICAL DATA:  Acute onset of worsening chest pain for 3 days. Shortness of breath and chest pressure. Initial encounter.  EXAM: CHEST - 1 VIEW  COMPARISON:  Chest radiograph and CTA of the chest performed 08/08/2014  FINDINGS: The lungs are well-aerated. Vascular congestion is noted, with increased interstitial markings, raising concern for mild interstitial edema. There is no evidence of pleural  effusion or pneumothorax.  The cardiomediastinal silhouette is borderline enlarged. No acute osseous abnormalities are seen.  IMPRESSION: Vascular congestion and borderline cardiomegaly, with increased interstitial markings, raising concern for mild interstitial edema.   Electronically Signed   By: Roanna Raider M.D.   On: 08/10/2014 06:15   Dg Chest 2 View  08/08/2014   CLINICAL DATA:  Pt c/o CP with radiation into LT shoulder and upper extremity today. Intermittent SOB and dizziness. Hx COPD, HTN, GERD, CAD, smoker. Initial encounter.  EXAM: CHEST  2 VIEW  COMPARISON:  07/31/2014  FINDINGS: Hyperinflation. Lateral view degraded by patient arm position. Numerous leads and wires project over the chest. Midline trachea. Mild cardiomegaly. No pleural effusion or pneumothorax. Diffuse peribronchial thickening. Clear lungs.  IMPRESSION: COPD/chronic bronchitis.  No acute superimposed process.  Cardiomegaly without congestive failure.   Electronically Signed   By: Jeronimo Greaves M.D.   On: 08/08/2014 19:04   Ct Angio Chest Pe W/cm &/or Wo Cm  08/08/2014   CLINICAL DATA:  Dyspnea on exertion  EXAM: CT ANGIOGRAPHY CHEST WITH CONTRAST  TECHNIQUE: Multidetector CT imaging of the chest was performed using the standard protocol during bolus administration of intravenous contrast. Multiplanar CT image reconstructions and MIPs were obtained to evaluate the vascular anatomy.  CONTRAST:  OMNIPAQUE IOHEXOL 350 MG/ML SOLN  COMPARISON:  Plain film from earlier in the same day  FINDINGS: The lungs are well aerated bilaterally. No focal infiltrate or sizable effusion is seen. Mild emphysematous changes are noted.  The thoracic inlet is within normal limits. The thoracic aorta in its branches are unremarkable. Note is made of a left vertebral origin from the aortic arch. Pulmonary artery is well visualized and demonstrates a normal branching pattern. No definitive pulmonary emboli are noted.  There are right hilar lymph  nodes which appear relatively stable from the prior exam and likely of a benign etiology. A few paraesophageal lymph nodes are noted as well lying between the esophagus and the right bronchus intermedius. Scanning into the upper abdomen reveals no acute abnormality. The osseous structures are within normal limits for the patient's given age.  Review of the MIP images confirms the above findings.  IMPRESSION: No evidence of pulmonary emboli.  Stable right hilar and mediastinal lymph nodes when compared with the prior exam.  No acute abnormality is noted.   Electronically Signed   By: Alcide Clever M.D.   On: 08/08/2014 20:51    Scheduled Meds: . amLODipine  5 mg Oral Daily  . aspirin  81 mg Oral Daily  . atorvastatin  10 mg Oral q1800  . budesonide-formoterol  2 puff Inhalation BID  . doxepin  25 mg Oral QHS  . furosemide  40 mg Oral Daily  .  heparin  5,000 Units Subcutaneous 3 times per day  . hydrochlorothiazide  25 mg Oral Daily  . ipratropium-albuterol  3 mL Nebulization Q6H  . isosorbide mononitrate  30 mg Oral BID  . meloxicam  15 mg Oral Daily  . methylPREDNISolone (SOLU-MEDROL) injection  60 mg Intravenous Q6H  . methylPREDNISolone sodium succinate      . pantoprazole  40 mg Oral Daily  . potassium chloride SA  20 mEq Oral Daily  . sodium chloride  3 mL Intravenous Q12H  . sodium chloride  3 mL Intravenous Q12H  . traZODone  100 mg Oral QHS   Continuous Infusions:      Time spent: 25 minutes    Courtney Bellizzi  [facility] Pager [phone] 7PM-7AM, please contact night-coverage at [email or web address], password [email or web address] 08/10/2014, 4:39 PM  LOS: 2 days

## 2014-08-10 NOTE — Progress Notes (Signed)
CCMD called and reported that pt had a pause of 3.93 seconds. MD aware.

## 2014-08-10 NOTE — Progress Notes (Signed)
Utilization Review completed.  

## 2014-08-10 NOTE — Progress Notes (Signed)
Pt reports new onset vomiting, unrelieved chest pain 7/10. Notified Dr. Bernette Redbird EKG and Troponin ordered. Pian medications given as ordered, pt is now resting in bed with eyes closed.

## 2014-08-11 ENCOUNTER — Inpatient Hospital Stay (HOSPITAL_COMMUNITY): Payer: Medicare HMO

## 2014-08-11 DIAGNOSIS — J441 Chronic obstructive pulmonary disease with (acute) exacerbation: Principal | ICD-10-CM

## 2014-08-11 LAB — GLUCOSE, CAPILLARY
GLUCOSE-CAPILLARY: 261 mg/dL — AB (ref 70–99)
Glucose-Capillary: 243 mg/dL — ABNORMAL HIGH (ref 70–99)
Glucose-Capillary: 246 mg/dL — ABNORMAL HIGH (ref 70–99)

## 2014-08-11 LAB — BASIC METABOLIC PANEL
Anion gap: 14 (ref 5–15)
BUN: 18 mg/dL (ref 6–23)
CHLORIDE: 98 meq/L (ref 96–112)
CO2: 29 meq/L (ref 19–32)
Calcium: 9.5 mg/dL (ref 8.4–10.5)
Creatinine, Ser: 1.11 mg/dL (ref 0.50–1.35)
GFR calc Af Amer: 84 mL/min — ABNORMAL LOW (ref >90)
GFR calc non Af Amer: 72 mL/min — ABNORMAL LOW (ref >90)
GLUCOSE: 242 mg/dL — AB (ref 70–99)
Potassium: 4.5 mEq/L (ref 3.7–5.3)
SODIUM: 141 meq/L (ref 137–147)

## 2014-08-11 LAB — HEMOGLOBIN A1C
HEMOGLOBIN A1C: 6.2 % — AB (ref <5.7)
MEAN PLASMA GLUCOSE: 131 mg/dL — AB (ref <117)

## 2014-08-11 MED ORDER — NAPROXEN 250 MG PO TABS
375.0000 mg | ORAL_TABLET | Freq: Two times a day (BID) | ORAL | Status: DC
  Administered 2014-08-11 – 2014-08-12 (×3): 375 mg via ORAL
  Filled 2014-08-11 (×3): qty 2

## 2014-08-11 MED ORDER — RANITIDINE HCL 150 MG/10ML PO SYRP
150.0000 mg | ORAL_SOLUTION | Freq: Two times a day (BID) | ORAL | Status: DC
  Administered 2014-08-11 – 2014-08-12 (×2): 150 mg via ORAL
  Filled 2014-08-11 (×6): qty 10

## 2014-08-11 MED ORDER — NICOTINE 21 MG/24HR TD PT24
21.0000 mg | MEDICATED_PATCH | Freq: Every day | TRANSDERMAL | Status: DC
Start: 2014-08-11 — End: 2014-08-12
  Administered 2014-08-11 – 2014-08-12 (×2): 21 mg via TRANSDERMAL
  Filled 2014-08-11 (×3): qty 1

## 2014-08-11 MED ORDER — PROMETHAZINE HCL 25 MG/ML IJ SOLN
12.5000 mg | Freq: Four times a day (QID) | INTRAMUSCULAR | Status: DC | PRN
  Administered 2014-08-11 (×2): 12.5 mg via INTRAVENOUS
  Filled 2014-08-11 (×2): qty 1

## 2014-08-11 MED ORDER — RANITIDINE HCL 150 MG/10ML PO SYRP
ORAL_SOLUTION | ORAL | Status: AC
  Filled 2014-08-11: qty 10

## 2014-08-11 MED ORDER — RANOLAZINE ER 500 MG PO TB12
500.0000 mg | ORAL_TABLET | Freq: Two times a day (BID) | ORAL | Status: DC
  Administered 2014-08-11 – 2014-08-12 (×3): 500 mg via ORAL
  Filled 2014-08-11 (×3): qty 1

## 2014-08-11 MED ORDER — LEVOFLOXACIN 750 MG PO TABS
750.0000 mg | ORAL_TABLET | Freq: Every day | ORAL | Status: DC
  Administered 2014-08-11 – 2014-08-12 (×2): 750 mg via ORAL
  Filled 2014-08-11 (×2): qty 1

## 2014-08-11 MED ORDER — BENZONATATE 100 MG PO CAPS
100.0000 mg | ORAL_CAPSULE | Freq: Two times a day (BID) | ORAL | Status: DC
  Administered 2014-08-11 – 2014-08-12 (×2): 100 mg via ORAL
  Filled 2014-08-11 (×2): qty 1

## 2014-08-11 MED ORDER — OXYCODONE HCL 5 MG PO TABS
5.0000 mg | ORAL_TABLET | Freq: Four times a day (QID) | ORAL | Status: DC | PRN
Start: 2014-08-11 — End: 2014-08-12
  Administered 2014-08-11 – 2014-08-12 (×3): 5 mg via ORAL
  Filled 2014-08-11 (×3): qty 1

## 2014-08-11 MED ORDER — INSULIN ASPART 100 UNIT/ML ~~LOC~~ SOLN
0.0000 [IU] | Freq: Three times a day (TID) | SUBCUTANEOUS | Status: DC
  Administered 2014-08-11: 8 [IU] via SUBCUTANEOUS
  Administered 2014-08-11: 5 [IU] via SUBCUTANEOUS
  Administered 2014-08-12: 3 [IU] via SUBCUTANEOUS

## 2014-08-11 MED ORDER — ALBUTEROL SULFATE (2.5 MG/3ML) 0.083% IN NEBU: 3.0000 mL | INHALATION_SOLUTION | RESPIRATORY_TRACT | Status: DC | PRN

## 2014-08-11 MED ORDER — MORPHINE SULFATE 4 MG/ML IJ SOLN
4.0000 mg | INTRAMUSCULAR | Status: DC | PRN
  Administered 2014-08-11 – 2014-08-12 (×7): 4 mg via INTRAVENOUS
  Filled 2014-08-11 (×7): qty 1

## 2014-08-11 MED ORDER — IPRATROPIUM-ALBUTEROL 0.5-2.5 (3) MG/3ML IN SOLN
3.0000 mL | RESPIRATORY_TRACT | Status: DC
  Administered 2014-08-11 – 2014-08-12 (×5): 3 mL via RESPIRATORY_TRACT
  Filled 2014-08-11 (×6): qty 3

## 2014-08-11 MED ORDER — OXYCODONE-ACETAMINOPHEN 5-325 MG PO TABS
2.0000 | ORAL_TABLET | Freq: Four times a day (QID) | ORAL | Status: DC | PRN
  Administered 2014-08-11 – 2014-08-12 (×3): 2 via ORAL
  Filled 2014-08-11 (×3): qty 2

## 2014-08-11 NOTE — Progress Notes (Signed)
Pt will not keep o2 on.

## 2014-08-11 NOTE — Progress Notes (Addendum)
[facility] PROGRESS NOTE  Supreme Lionslvin T Gockley ZOX:[id number] DOB: 1955-10-10 DOA: 08/08/2014 PCP: Toma DeitersHASANAJ,[name], MD   brief narrative 58 year old morbidly obese male with history of hypertension, coronary artery disease with cardiac catheterization in January 2015 showing moderate CAD with 30% LAD and proximal left circumflex stenosis managed medically, hypertension, ongoing tobacco use, exactly and depression, GERD, COPD who presented with a 2 day history of left-sided chest pain radiating to the arm and the neck. Patient admitted for further workup.  Assessment/Plan: Chest pain with hx Of CAD Serial troponins negative.. No EKG changes. 2D echo with normal EF and no wall motion abnormality. Cardiac cath earlier this year with moderate CAD managed medically. Has hx of nonobstructive disease.  Seen by cardiology. Feel he this could be both cardiac and non cardiac and can have vasospastic angina. Increased imdur to 30 mg twice daily.  continue ASA, statin. Hold Beta blocker given pause on telemetry. Still having persistent chest and left arm pain. Will increase morphine to 4 mg every 3 hours when necessary. Increase dose of Percocet. Will add Naprosyn twice a day. -I will add Ranexa 500 mg every 12 hours. Continue when necessary nitroglycerin. . Cardiology will reevaluate on Monday. -Patient nauseous and vomiting as well. Check chest x-ray, LFTs and ultrasound abdomen -Monitor on telemetry. Serial troponins remain negative.  COPD exacerbation Placed on IV Solu Medrol 60 mg q 6 hrs. Change to scheduled DuoNeb's and when necessary albuterol neb. Has persistent wheezing. Dropping O2 sats to 85% on room air. Continue antitussives. We'll add Levaquin empirically. Check 2 view chest  x-ray. CT angiogram of the chest negative for PE on admission.   Bradycardia with pause Patient had a 3.93 second pause on telemetry this morning followed by heart rate in the low 40s. asymptomatic. Will hold  metoprolol and monitor. Potassium and magnesium normal.   HTN stable. Continue current meds  Hyperlipidemia continue statin    Tobacco abuse.  Counseled strongly on smoking cessation. placed nicotine patch  History of depression Continue home medications  Hyperglycemia Noted for elevated blood glucose while on IV Solu-Medrol. No history of diabetes mellitus. Place on sliding scale coverage. Check A1c  DVT prophylaxis: sq heparin  Diet: cardiac  Code Status: full code Family Communication: none at bedisde Disposition Plan: Pending improvement in symptoms. Further cardiology evaluation tomorrow   Consultants:  cardiology  Procedures:  2d echo   Antibiotics:  none  HPI/Subjective: Seen and examined . Still having persistent left-sided chest pain and left arm pain. Reports 3 episodes of vomiting overnight and being wheezy. O2 sat dropped to 80s on room air.  Objective: Filed Vitals:   08/11/14 0634  BP: 132/61  Pulse: 90  Temp: 98.9 F (37.2 C)  Resp: 20    Intake/Output Summary (Last 24 hours) at 08/11/14 [id number] Last data filed at 08/11/14 [id number]  Gross per 24 hour  Intake    480 ml  Output   2600 ml  Net  -2120 ml   Filed Weights   08/09/14 1121  Weight: 162.025 kg (357 lb 3.2 oz)    Exam:   General:  Middle aged morbidly obese male in no acute distress  HEENT:  moist oral mucosa  Cardiovascular: Normal S1 and S2, no murmurs  Chest: Bilateral wheezing diffusely, no crackles  Abdomen: Soft , nondistended, bowel sounds present, Mild epigastric tenderness  Musculoskeletal: Warm, no edema    Data Reviewed: Basic Metabolic Panel:  Recent Labs Lab 08/08/14 1755 08/09/14 0314 08/10/14 0510 08/11/14 [id number]  NA 138 137  --  141  K 3.4* 4.4 3.9 4.5  CL 98 100  --  98  CO2 26 26  --  29  GLUCOSE 128* 170*  --  242*  BUN 13 11  --  18  CREATININE 1.03 0.99  --  1.11  CALCIUM 9.5 9.5  --  9.5  MG  --   --  1.9  --    Liver Function  Tests:  Recent Labs Lab 08/08/14 1755 08/09/14 0314  AST 18 17  ALT 19 18  ALKPHOS 59 59  BILITOT 0.4 0.3  PROT 7.5 7.8  ALBUMIN 3.7 3.8   No results for input(s): LIPASE, AMYLASE in the last 168 hours. No results for input(s): AMMONIA in the last 168 hours. CBC:  Recent Labs Lab 08/08/14 1755  WBC 10.1  NEUTROABS 5.5  HGB 14.2  HCT 41.2  MCV 93.8  PLT 232   Cardiac Enzymes:  Recent Labs Lab 08/08/14 2141 08/09/14 0314 08/09/14 0851 08/10/14 0510 08/10/14 2123  TROPONINI <0.30 <0.30 <0.30 <0.30 <0.30   BNP (last 3 results)  Recent Labs  12/05/13 0146 08/08/14 1755 08/09/14 0314  PROBNP 21.1 27.9 29.7   CBG: No results for input(s): GLUCAP in the last 168 hours.  No results found for this or any previous visit (from the past 240 hour(s)).   Studies: Dg Chest 1 View  08/10/2014   CLINICAL DATA:  Acute onset of worsening chest pain for 3 days. Shortness of breath and chest pressure. Initial encounter.  EXAM: CHEST - 1 VIEW  COMPARISON:  Chest radiograph and CTA of the chest performed 08/08/2014  FINDINGS: The lungs are well-aerated. Vascular congestion is noted, with increased interstitial markings, raising concern for mild interstitial edema. There is no evidence of pleural effusion or pneumothorax.  The cardiomediastinal silhouette is borderline enlarged. No acute osseous abnormalities are seen.  IMPRESSION: Vascular congestion and borderline cardiomegaly, with increased interstitial markings, raising concern for mild interstitial edema.   Electronically Signed   By: Roanna Raider M.D.   On: 08/10/2014 06:15    Scheduled Meds: . amLODipine  5 mg Oral Daily  . aspirin  81 mg Oral Daily  . atorvastatin  10 mg Oral q1800  . budesonide-formoterol  2 puff Inhalation BID  . doxepin  25 mg Oral QHS  . furosemide  20 mg Intravenous Q12H  . heparin  5,000 Units Subcutaneous 3 times per day  . hydrochlorothiazide  25 mg Oral Daily  . insulin aspart  0-15 Units  Subcutaneous TID WC  . ipratropium-albuterol  3 mL Nebulization Q4H  . isosorbide mononitrate  30 mg Oral BID  . meloxicam  15 mg Oral Daily  . methylPREDNISolone (SOLU-MEDROL) injection  60 mg Intravenous Q6H  . nicotine  21 mg Transdermal Daily  . pantoprazole  40 mg Oral Daily  . potassium chloride SA  20 mEq Oral Daily  . sodium chloride  3 mL Intravenous Q12H  . sodium chloride  3 mL Intravenous Q12H  . traZODone  100 mg Oral QHS   Continuous Infusions:     Time spent: 25 minutes    Iya Hamed  [facility] Pager [phone] If 7PM-7AM, please contact night-coverage at [email or web address], password [facility] 08/11/2014, 8:37 AM  LOS: 3 days

## 2014-08-12 ENCOUNTER — Inpatient Hospital Stay (HOSPITAL_COMMUNITY): Payer: Medicare HMO

## 2014-08-12 DIAGNOSIS — R7303 Prediabetes: Secondary | ICD-10-CM | POA: Diagnosis present

## 2014-08-12 LAB — GLUCOSE, CAPILLARY: Glucose-Capillary: 162 mg/dL — ABNORMAL HIGH (ref 70–99)

## 2014-08-12 MED ORDER — PREDNISONE 20 MG PO TABS
50.0000 mg | ORAL_TABLET | Freq: Every day | ORAL | Status: DC
  Administered 2014-08-12: 50 mg via ORAL
  Filled 2014-08-12: qty 1
  Filled 2014-08-12: qty 2

## 2014-08-12 MED ORDER — NICOTINE 21 MG/24HR TD PT24: 21.0000 mg | MEDICATED_PATCH | Freq: Every day | TRANSDERMAL | Status: DC

## 2014-08-12 MED ORDER — RANOLAZINE ER 500 MG PO TB12: 500.0000 mg | ORAL_TABLET | Freq: Two times a day (BID) | ORAL | Status: DC

## 2014-08-12 MED ORDER — ISOSORBIDE MONONITRATE ER 30 MG PO TB24: 30.0000 mg | ORAL_TABLET | Freq: Two times a day (BID) | ORAL | Status: DC

## 2014-08-12 MED ORDER — PREDNISONE 20 MG PO TABS: 60.0000 mg | ORAL_TABLET | Freq: Two times a day (BID) | ORAL | Status: DC

## 2014-08-12 MED ORDER — LEVOFLOXACIN 750 MG PO TABS: 750.0000 mg | ORAL_TABLET | Freq: Every day | ORAL | Status: DC

## 2014-08-12 NOTE — Discharge Summary (Signed)
Physician Discharge Summary  [name] AYT:[id number] DOB: [birth year] DOA: 08/08/2014  PCP: [name], MD  Admit date: 08/08/2014 Discharge date: 08/12/2014  Time spent: 35 minutes  Recommendations for Outpatient Follow-up:  1. D/c home with ouitpt PCP   Discharge Diagnoses:  Principle problem   Chest pain  Active Problems:   Dyslipidemia   OBESITY, MORBID   Coronary atherosclerosis of native coronary artery   Tobacco abuse   COPD exacerbation   Prediabetes   Discharge Condition: fair  Diet recommendation: heart healthy  Filed Weights   08/09/14 1121 08/12/14 0644  Weight: 162.025 kg (357 lb 3.2 oz) 161.843 kg (356 lb 12.8 oz)    History of present illness:  Please refer to admission H&P for details, but in brief, 58 year old morbidly obese male with history of hypertension, coronary artery disease with cardiac catheterization in January 2015 showing moderate CAD with 30% LAD and proximal left circumflex stenosis managed medically, hypertension, ongoing tobacco use, exactly and depression, GERD, COPD who presented with a 2 day history of left-sided chest pain radiating to the arm and the neck. Patient admitted for further workup  Hospital Course:  Chest pain with hx Of CAD Serial troponins negative.. No EKG changes. 2D echo with normal EF and no wall motion abnormality. Cardiac cath earlier this year with moderate CAD managed medically. Has hx of nonobstructive disease.  Seen by cardiology. Feel he this could be both cardiac and non cardiac and can have vasospastic angina. Increased imdur to 30 mg twice daily.  continue ASA, statin. Hold Beta blocker given pause on telemetry and COPD exacerbation.. Increased pain medications. added Ranexa 500 mg every 12 hours. Continue when necessary nitroglycerin. . Cardiology re evaluated today. Symptoms have resolved. Likely a component of GERD vs COPD.  -stable for dsicharge home with outtp cardiology follow up in 6  weeks. counseled on diet adherence, medication compliance and exercise to lose weight. Also emphasized on quitting smoking.  COPD exacerbation Placed on IV Solu Medrol 60 mg q 6 hrs. Change to scheduled DuoNeb's and when necessary albuterol neb.symptoms now improved.  added Levaquin empirically. 2 view chest x-ray unremarkable.. CT angiogram of the chest negative for PE on admission.  Patient will be discharged on short prednisone taper, 5 day course of oral levaquin and home inhalers and nebs. counseled strongly on smoking cessation. Prescribed nicotine patch.   Bradycardia with pause Patient had a 3.93 second pause on telemetry this morning followed by heart rate in the low 40s. asymptomatic. Will hold metoprolol. Potassium and magnesium normal. Follow as outpt.   HTN stable. Continue current meds  Hyperlipidemia continue statin   Tobacco abuse.  Counseled strongly on smoking cessation. placed nicotine patch  History of depression Continue home medications  Prediabetes counseled on diet and exercise. A1C of 6.2    Diet: cardiac  Code Status: full code Family Communication: family at bedside Disposition Plan: home with outpt follow up   Consultants:  cardiology  Procedures:  2d echo  Antibiotics:  levoquin    Discharge Exam: Filed Vitals:   08/12/14 0644  BP: 147/62  Pulse: 93  Temp: 97.5 F (36.4 C)  Resp: 18    General: Middle aged morbidly obese male in no acute distress  HEENT: moist oral mucosa  Cardiovascular: Normal S1 and S2, no murmurs  Chest: clear b/l , no further wheezing,  no crackles  Abdomen: Soft , nondistended, bowel sounds present, non tender  Musculoskeletal: Warm, no edema   Discharge Instructions  Current Discharge Medication List   Discontinued medication metoprolol succinate (TOPROL-XL) 25 MG 24 hr tablet       START taking these medications   Details  levofloxacin (LEVAQUIN) 750 MG tablet Take 1  tablet (750 mg total) by mouth daily. Qty: 3 tablet, Refills: 0    nicotine (NICODERM CQ) 21 mg/24hr patch Place 1 patch (21 mg total) onto the skin daily. Qty: 28 patch, Refills: 0    ranolazine (RANEXA) 500 MG 12 hr tablet Take 1 tablet (500 mg total) by mouth 2 (two) times daily. Qty: 60 tablet, Refills: 0      CONTINUE these medications which have CHANGED   Details  isosorbide mononitrate (IMDUR) 30 MG 24 hr tablet Take 1 tablet (30 mg total) by mouth 2 (two) times daily. Qty: 60 tablet, Refills: 0    predniSONE (DELTASONE) 20 MG tablet Take 3 tablets (60 mg total) by mouth 2 (two) times daily with a meal. Take 3 tablets two times daily x 2 days; then 2 tablets (40mg ) two times daily x 2 days, then 1 tablet (20mg ) two times a day x 2 days Qty: 24 tablet, Refills: 0      CONTINUE these medications which have NOT CHANGED   Details  ALPRAZolam (XANAX) 1 MG tablet Take 1 tablet (1 mg total) by mouth 3 (three) times daily as needed for anxiety. For anxiety Qty: 30 tablet, Refills: 0    amLODipine (NORVASC) 5 MG tablet Take 5 mg by mouth daily.    aspirin 81 MG tablet Take 1 tablet (81 mg total) by mouth daily. Qty: 30 tablet, Refills: 0    atorvastatin (LIPITOR) 10 MG tablet TAKE 1 TABLET DAILY AT 6:00PM. Qty: 30 tablet, Refills: 0    budesonide-formoterol (SYMBICORT) 160-4.5 MCG/ACT inhaler Inhale 2 puffs into the lungs 2 (two) times daily.    citalopram (CELEXA) 40 MG tablet Take 40 mg by mouth daily.      doxepin (SINEQUAN) 25 MG capsule Take 25 mg by mouth at bedtime.     furosemide (LASIX) 40 MG tablet Take 1 tablet (40 mg total) by mouth daily. Qty: 30 tablet, Refills: 0    hydrochlorothiazide (HYDRODIURIL) 25 MG tablet Take 25 mg by mouth daily.    ipratropium-albuterol (DUONEB) 0.5-2.5 (3) MG/3ML SOLN Take 3 mLs by nebulization every 6 (six) hours as needed (shortness of breath/ wheezing.).     meloxicam (MOBIC) 15 MG tablet Take 1 tablet by mouth daily.          nitroGLYCERIN (NITROSTAT) 0.4 MG SL tablet Place 0.4 mg under the tongue every 5 (five) minutes as needed. For chest pain    omeprazole (PRILOSEC) 20 MG capsule Take 20 mg by mouth daily.    oxyCODONE-acetaminophen (PERCOCET) 10-325 MG per tablet Take 1 tablet by mouth every 6 (six) hours as needed for pain.     potassium chloride SA (K-DUR,KLOR-CON) 20 MEQ tablet Take 1 tablet by mouth daily.    traZODone (DESYREL) 100 MG tablet Take 100 mg by mouth at bedtime.     PROAIR HFA 108 (90 BASE) MCG/ACT inhaler Inhale 2 puffs into the lungs daily as needed for wheezing or shortness of breath.        Allergies  Allergen Reactions  . Ativan [Lorazepam] Other (See Comments)    "out of mind"  . Dilaudid [Hydromorphone Hcl] Other (See Comments)    " out of mind"  . Haloperidol Decanoate    Follow-up Information    Follow up with  Nona Dell, MD On 09/26/2014.   Specialty:  Cardiology   Why:  11:20 am   Contact information:   [address] [place] [address] [phone]       Follow up with [name],[name], MD. Schedule an appointment as soon as possible for a visit in 1 week.   Specialty:  Internal Medicine   Contact information:   [address] [place] [address] [phone]        The results of significant diagnostics from this hospitalization (including imaging, microbiology, ancillary and laboratory) are listed below for reference.    Significant Diagnostic Studies: Dg Chest 1 View  08/10/2014   CLINICAL DATA:  Acute onset of worsening chest pain for 3 days. Shortness of breath and chest pressure. Initial encounter.  EXAM: CHEST - 1 VIEW  COMPARISON:  Chest radiograph and CTA of the chest performed 08/08/2014  FINDINGS: The lungs are well-aerated. Vascular congestion is noted, with increased interstitial markings, raising concern for mild interstitial edema. There is no evidence of pleural effusion or pneumothorax.  The cardiomediastinal silhouette is  borderline enlarged. No acute osseous abnormalities are seen.  IMPRESSION: Vascular congestion and borderline cardiomegaly, with increased interstitial markings, raising concern for mild interstitial edema.   Electronically Signed   By: Roanna Raider M.D.   On: 08/10/2014 06:15   Dg Chest 2 View  08/11/2014   CLINICAL DATA:  Shortness of breath, weakness, fever, left-sided chest pain, left arm and neck pain for 4 days  EXAM: CHEST  2 VIEW  COMPARISON:  08/10/2014  FINDINGS: There is no focal parenchymal opacity, pleural effusion, or pneumothorax. There is stable cardiomegaly.  There is mild thoracic spine spondylosis.  IMPRESSION: No active cardiopulmonary disease.   Electronically Signed   By: Elige Ko   On: 08/11/2014 10:12   Dg Chest 2 View  08/08/2014   CLINICAL DATA:  Pt c/o CP with radiation into LT shoulder and upper extremity today. Intermittent SOB and dizziness. Hx COPD, HTN, GERD, CAD, smoker. Initial encounter.  EXAM: CHEST  2 VIEW  COMPARISON:  07/31/2014  FINDINGS: Hyperinflation. Lateral view degraded by patient arm position. Numerous leads and wires project over the chest. Midline trachea. Mild cardiomegaly. No pleural effusion or pneumothorax. Diffuse peribronchial thickening. Clear lungs.  IMPRESSION: COPD/chronic bronchitis.  No acute superimposed process.  Cardiomegaly without congestive failure.   Electronically Signed   By: Jeronimo Greaves M.D.   On: 08/08/2014 19:04   Ct Angio Chest Pe W/cm &/or Wo Cm  08/08/2014   CLINICAL DATA:  Dyspnea on exertion  EXAM: CT ANGIOGRAPHY CHEST WITH CONTRAST  TECHNIQUE: Multidetector CT imaging of the chest was performed using the standard protocol during bolus administration of intravenous contrast. Multiplanar CT image reconstructions and MIPs were obtained to evaluate the vascular anatomy.  CONTRAST:  OMNIPAQUE IOHEXOL 350 MG/ML SOLN  COMPARISON:  Plain film from earlier in the same day  FINDINGS: The lungs are well aerated bilaterally.  No focal infiltrate or sizable effusion is seen. Mild emphysematous changes are noted.  The thoracic inlet is within normal limits. The thoracic aorta in its branches are unremarkable. Note is made of a left vertebral origin from the aortic arch. Pulmonary artery is well visualized and demonstrates a normal branching pattern. No definitive pulmonary emboli are noted.  There are right hilar lymph nodes which appear relatively stable from the prior exam and likely of a benign etiology. A few paraesophageal lymph nodes are noted as  well lying between the esophagus and the right bronchus intermedius. Scanning into the upper abdomen reveals no acute abnormality. The osseous structures are within normal limits for the patient's given age.  Review of the MIP images confirms the above findings.  IMPRESSION: No evidence of pulmonary emboli.  Stable right hilar and mediastinal lymph nodes when compared with the prior exam.  No acute abnormality is noted.   Electronically Signed   By: Alcide Clever M.D.   On: 08/08/2014 20:51    Microbiology: No results found for this or any previous visit (from the past 240 hour(s)).   Labs: Basic Metabolic Panel:  Recent Labs Lab 08/08/14 1755 08/09/14 0314 08/10/14 0510 08/11/14 0604  NA 138 137  --  141  K 3.4* 4.4 3.9 4.5  CL 98 100  --  98  CO2 26 26  --  29  GLUCOSE 128* 170*  --  242*  BUN 13 11  --  18  CREATININE 1.03 0.99  --  1.11  CALCIUM 9.5 9.5  --  9.5  MG  --   --  1.9  --    Liver Function Tests:  Recent Labs Lab 08/08/14 1755 08/09/14 0314  AST 18 17  ALT 19 18  ALKPHOS 59 59  BILITOT 0.4 0.3  PROT 7.5 7.8  ALBUMIN 3.7 3.8   No results for input(s): LIPASE, AMYLASE in the last 168 hours. No results for input(s): AMMONIA in the last 168 hours. CBC:  Recent Labs Lab 08/08/14 1755  WBC 10.1  NEUTROABS 5.5  HGB 14.2  HCT 41.2  MCV 93.8  PLT 232   Cardiac Enzymes:  Recent Labs Lab 08/08/14 2141 08/09/14 0314 08/09/14 0851  08/10/14 0510 08/10/14 2123  TROPONINI <0.30 <0.30 <0.30 <0.30 <0.30   BNP: BNP (last 3 results)  Recent Labs  12/05/13 0146 08/08/14 1755 08/09/14 0314  PROBNP 21.1 27.9 29.7   CBG:  Recent Labs Lab 08/11/14 1153 08/11/14 1625 08/11/14 2147 08/12/14 0807  GLUCAP 243* 261* 246* 162*       Signed:  Zubayr Bednarczyk  [facility] 08/12/2014, 10:57 AM

## 2014-08-12 NOTE — Progress Notes (Signed)
Inpatient Diabetes Program Recommendations  AACE/ADA: New Consensus Statement on Inpatient Glycemic Control (2013)  Target Ranges:  Prepandial:   less than 140 mg/dL      Peak postprandial:   less than 180 mg/dL (1-2 hours)      Critically ill patients:  140 - 180 mg/dL   Results for BENTLEY, YOKEL (MRN [id number]) as of 08/12/2014 08:25  Ref. Range 08/11/2014 11:53 08/11/2014 16:25 08/11/2014 21:47 08/12/2014 08:07  Glucose-Capillary Latest Range: 70-99 mg/dL 828 (H) 003 (H) 491 (H) 162 (H)   Diabetes history: NO Outpatient Diabetes medications: NA Current orders for Inpatient glycemic control: Novolog 0-15 units TID with meals  Inpatient Diabetes Program Recommendations Correction (SSI): Please consider increasing Novolog correction to resistant scale and adding Novolog bedtime correction scale.  Note: Patient has no history of diabetes. However, patient is ordered steroids which are contributing to hyperglycemia. Please consider increasing Novolog correction to resistant scale and adding Novolog bedtime correction scale.  Thanks, Orlando Penner, RN, MSN, CCRN, CDE Diabetes Coordinator Inpatient Diabetes Program [phone] (Team Pager) [phone] ([facility] office) [phone] [facility] office)

## 2014-08-12 NOTE — Progress Notes (Signed)
Consulting cardiologist: Dina RichBranch, Elayjah Chaney MD Primary Cardiologist: Nona DellMcDowell, Samuel MD  Cardiology Specific Problem List: 1. CAD-Non-obstructive 2. Chest Pain 3. Hypertension 4. Hyperlipidemia   Subjective:    Going home today. MULTIPLE QUESTIONS. No further complaints.   Objective:   Temp:  [97.5 F (36.4 C)-98.5 F (36.9 C)] 97.5 F (36.4 C) (12/21 0644) Pulse Rate:  [77-93] 93 (12/21 0644) Resp:  [16-20] 18 (12/21 0644) BP: (134-147)/(62-77) 147/62 mmHg (12/21 0644) SpO2:  [92 %-99 %] 96 % (12/21 0755) Weight:  [356 lb 12.8 oz (161.843 kg)] 356 lb 12.8 oz (161.843 kg) (12/21 0644) Last BM Date: 08/11/14  Filed Weights   08/09/14 1121 08/12/14 0644  Weight: 357 lb 3.2 oz (162.025 kg) 356 lb 12.8 oz (161.843 kg)    Intake/Output Summary (Last 24 hours) at 08/12/14 0852 Last data filed at 08/12/14 0646  Gross per 24 hour  Intake    480 ml  Output   3700 ml  Net  -3220 ml    Telemetry: NSR  Exam:  General: No acute distress. Morbidly obese.  HEENT: Conjunctiva and lids normal, oropharynx clear.  Lungs: Clear to auscultation, nonlabored.  Cardiac: No elevated JVP or bruits. RRR, no gallop or rub.   Abdomen: Normoactive bowel sounds, nontender, nondistended.  Extremities: No pitting edema, distal pulses full.  Neuropsychiatric: Alert and oriented x3, affect appropriate.   Lab Results:  Basic Metabolic Panel:  Recent Labs Lab 08/08/14 1755 08/09/14 0314 08/10/14 0510 08/11/14 0604  NA 138 137  --  141  K 3.4* 4.4 3.9 4.5  CL 98 100  --  98  CO2 26 26  --  29  GLUCOSE 128* 170*  --  242*  BUN 13 11  --  18  CREATININE 1.03 0.99  --  1.11  CALCIUM 9.5 9.5  --  9.5  MG  --   --  1.9  --     Liver Function Tests:  Recent Labs Lab 08/08/14 1755 08/09/14 0314  AST 18 17  ALT 19 18  ALKPHOS 59 59  BILITOT 0.4 0.3  PROT 7.5 7.8  ALBUMIN 3.7 3.8    CBC:  Recent Labs Lab 08/08/14 1755  WBC 10.1  HGB 14.2  HCT 41.2  MCV 93.8   PLT 232    Cardiac Enzymes:  Recent Labs Lab 08/09/14 0851 08/10/14 0510 08/10/14 2123  TROPONINI <0.30 <0.30 <0.30    BNP:  Recent Labs  12/05/13 0146 08/08/14 1755 08/09/14 0314  PROBNP 21.1 27.9 29.7    Radiology: Dg Chest 2 View  08/11/2014   CLINICAL DATA:  Shortness of breath, weakness, fever, left-sided chest pain, left arm and neck pain for 4 days  EXAM: CHEST  2 VIEW  COMPARISON:  08/10/2014  FINDINGS: There is no focal parenchymal opacity, pleural effusion, or pneumothorax. There is stable cardiomegaly.  There is mild thoracic spine spondylosis.  IMPRESSION: No active cardiopulmonary disease.   Electronically Signed   By: Elige KoHetal  Patel   On: 08/11/2014 10:12      Medications:   Scheduled Medications: . amLODipine  5 mg Oral Daily  . aspirin  81 mg Oral Daily  . atorvastatin  10 mg Oral q1800  . benzonatate  100 mg Oral BID  . budesonide-formoterol  2 puff Inhalation BID  . doxepin  25 mg Oral QHS  . furosemide  20 mg Intravenous Q12H  . heparin  5,000 Units Subcutaneous 3 times per day  . hydrochlorothiazide  25 mg Oral  Daily  . insulin aspart  0-15 Units Subcutaneous TID WC  . ipratropium-albuterol  3 mL Nebulization Q4H  . isosorbide mononitrate  30 mg Oral BID  . levofloxacin  750 mg Oral Daily  . naproxen  375 mg Oral BID WC  . nicotine  21 mg Transdermal Daily  . pantoprazole  40 mg Oral Daily  . potassium chloride SA  20 mEq Oral Daily  . predniSONE  50 mg Oral Q breakfast  . ranitidine  150 mg Oral BID  . ranolazine  500 mg Oral BID  . sodium chloride  3 mL Intravenous Q12H  . sodium chloride  3 mL Intravenous Q12H  . traZODone  100 mg Oral QHS    Infusions:    PRN Medications: albuterol, ALPRAZolam, morphine injection, nitroGLYCERIN, ondansetron **OR** ondansetron (ZOFRAN) IV, oxyCODONE-acetaminophen **AND** oxyCODONE, promethazine   Assessment and Plan:  1. Chest Pain: Atypical with negative troponin. Likely related to GERD and  COPD. Troponin negative. Symptoms improved with steroids and pain relief. Echo is normal  Follow up appt is made in [place]. He is requesting diet instructions for low cholesterol and diabetic diet. Will have nurses provide to him on discharge.   2.CAD: Non-obstructive per recent cath. Medically managed with statin, nitrates and Ranexa. No BB due to COPD with wheezing. Follow up appt in [place] made.   3. Diabetes; Management per PCP. He is given diabetic diet instructions on d/c.  4. Tobacco abuse: Plans to quit.    Bettey Mare. Lawrence NP [facility]  08/12/2014, 8:52 AM   Patient seen and discussed with NP Lyman Bishop. Atypical chest pain without evidence of ACS by enzymes or EKG. Echo with normal LVEF 60-65%, no WMAs. Cath Jan 2015 with moderate non-obstructive disease. Agree with intensified antiangional therapy with increased imdur and ranexa, no indication for repeat ischemic testing at this time. Will sign off of inpatient care   Dominga Ferry MD

## 2014-08-12 NOTE — Discharge Instructions (Signed)
Nicotine Addiction Nicotine can act as both a stimulant (excites/activates) and a sedative (calms/quiets). Immediately after exposure to nicotine, there is a "kick" caused in part by the drug's stimulation of the adrenal glands and resulting discharge of adrenaline (epinephrine). The rush of adrenaline stimulates the body and causes a sudden release of sugar. This means that smokers are always slightly hyperglycemic. Hyperglycemic means that the blood sugar is high, just like in diabetics. Nicotine also decreases the amount of insulin which helps control sugar levels in the body. There is an increase in blood pressure, breathing, and the rate of heart beats.  In addition, nicotine indirectly causes a release of dopamine in the brain that controls pleasure and motivation. A similar reaction is seen with other drugs of abuse, such as cocaine and heroin. This dopamine release is thought to cause the pleasurable sensations when smoking. In some different cases, nicotine can also create a calming effect, depending on sensitivity of the smoker's nervous system and the dose of nicotine taken. WHAT HAPPENS WHEN NICOTINE IS TAKEN FOR LONG PERIODS OF TIME?  Long-term use of nicotine results in addiction. It is difficult to stop.  Repeated use of nicotine creates tolerance. Higher doses of nicotine are needed to get the "kick." When nicotine use is stopped, withdrawal may last a month or more. Withdrawal may begin within a few hours after the last cigarette. Symptoms peak within the first few days and may lessen within a few weeks. For some people, however, symptoms may last for months or longer. Withdrawal symptoms include:   Irritability.  Craving.  Learning and attention deficits.  Sleep disturbances.  Increased appetite. Craving for tobacco may last for 6 months or longer. Many behaviors done while using nicotine can also play a part in the severity of withdrawal symptoms. For some people, the feel,  smell, and sight of a cigarette and the ritual of obtaining, handling, lighting, and smoking the cigarette are closely linked with the pleasure of smoking. When stopped, they also miss the related behaviors which make the withdrawal or craving worse. While nicotine gum and patches may lessen the drug aspects of withdrawal, cravings often persist. WHAT ARE THE MEDICAL CONSEQUENCES OF NICOTINE USE?  Nicotine addiction accounts for one-third of all cancers. The top cancer caused by tobacco is lung cancer. Lung cancer is the number one cancer killer of both men and women.  Smoking is also associated with cancers of the:  Mouth.  Pharynx.  Larynx.  Esophagus.  Stomach.  Pancreas.  Cervix.  Kidney.  Ureter.  Bladder.  Smoking also causes lung diseases such as lasting (chronic) bronchitis and emphysema.  It worsens asthma in adults and children.  Smoking increases the risk of heart disease, including:  Stroke.  Heart attack.  Vascular disease.  Aneurysm.  Passive or secondary smoke can also increase medical risks including:  Asthma in children.  Sudden Infant Death Syndrome (SIDS).  Additionally, dropped cigarettes are the leading cause of residential fire fatalities.  Nicotine poisoning has been reported from accidental ingestion of tobacco products by children and pets. Death usually results in a few minutes from respiratory failure (when a person stops breathing) caused by paralysis. TREATMENT   Medication. Nicotine replacement medicines such as nicotine gum and the patch are used to stop smoking. These medicines gradually lower the dosage of nicotine in the body. These medicines do not contain the carbon monoxide and other toxins found in tobacco smoke.  Hypnotherapy.  Relaxation therapy.  Nicotine Anonymous (a 12-step support   program). Find times and locations in your local yellow pages. Document Released: 04/14/2004 Document Revised: 11/01/2011 Document  Reviewed: 10/05/2013 [employer] Patient Information 2015 [employer]. This information is not intended to replace advice given to you by your health care provider. Make sure you discuss any questions you have with your health care provider.  

## 2014-08-13 NOTE — Care Management Utilization Note (Signed)
UR complete 

## 2014-08-14 NOTE — Progress Notes (Signed)
Discharge instructions and prescriptions given, verbalized understanding, out in stable condition with w/c with staff.

## 2014-09-18 ENCOUNTER — Encounter (HOSPITAL_COMMUNITY): Payer: Self-pay | Admitting: Emergency Medicine

## 2014-09-18 ENCOUNTER — Encounter: Payer: Self-pay | Admitting: *Deleted

## 2014-09-18 ENCOUNTER — Emergency Department (HOSPITAL_COMMUNITY): Payer: Medicare HMO

## 2014-09-18 ENCOUNTER — Observation Stay (HOSPITAL_COMMUNITY)
Admission: EM | Admit: 2014-09-18 | Discharge: 2014-09-19 | Discharge status: Against Medical Advice | Payer: Medicare HMO | Attending: Internal Medicine | Admitting: Internal Medicine

## 2014-09-18 DIAGNOSIS — I10 Essential (primary) hypertension: Secondary | ICD-10-CM | POA: Insufficient documentation

## 2014-09-18 DIAGNOSIS — F1010 Alcohol abuse, uncomplicated: Secondary | ICD-10-CM | POA: Diagnosis not present

## 2014-09-18 DIAGNOSIS — F1410 Cocaine abuse, uncomplicated: Secondary | ICD-10-CM | POA: Diagnosis not present

## 2014-09-18 DIAGNOSIS — R0602 Shortness of breath: Secondary | ICD-10-CM | POA: Diagnosis present

## 2014-09-18 DIAGNOSIS — J449 Chronic obstructive pulmonary disease, unspecified: Secondary | ICD-10-CM | POA: Diagnosis not present

## 2014-09-18 DIAGNOSIS — E669 Obesity, unspecified: Secondary | ICD-10-CM | POA: Diagnosis not present

## 2014-09-18 DIAGNOSIS — M1990 Unspecified osteoarthritis, unspecified site: Secondary | ICD-10-CM | POA: Insufficient documentation

## 2014-09-18 DIAGNOSIS — R079 Chest pain, unspecified: Principal | ICD-10-CM | POA: Diagnosis present

## 2014-09-18 DIAGNOSIS — F329 Major depressive disorder, single episode, unspecified: Secondary | ICD-10-CM | POA: Diagnosis not present

## 2014-09-18 DIAGNOSIS — Z79899 Other long term (current) drug therapy: Secondary | ICD-10-CM | POA: Diagnosis not present

## 2014-09-18 DIAGNOSIS — M549 Dorsalgia, unspecified: Secondary | ICD-10-CM | POA: Insufficient documentation

## 2014-09-18 DIAGNOSIS — Z888 Allergy status to other drugs, medicaments and biological substances status: Secondary | ICD-10-CM | POA: Insufficient documentation

## 2014-09-18 DIAGNOSIS — R739 Hyperglycemia, unspecified: Secondary | ICD-10-CM

## 2014-09-18 DIAGNOSIS — R61 Generalized hyperhidrosis: Secondary | ICD-10-CM | POA: Diagnosis not present

## 2014-09-18 DIAGNOSIS — G8929 Other chronic pain: Secondary | ICD-10-CM | POA: Insufficient documentation

## 2014-09-18 DIAGNOSIS — Z885 Allergy status to narcotic agent status: Secondary | ICD-10-CM | POA: Insufficient documentation

## 2014-09-18 DIAGNOSIS — K219 Gastro-esophageal reflux disease without esophagitis: Secondary | ICD-10-CM | POA: Insufficient documentation

## 2014-09-18 DIAGNOSIS — F17210 Nicotine dependence, cigarettes, uncomplicated: Secondary | ICD-10-CM | POA: Insufficient documentation

## 2014-09-18 DIAGNOSIS — E785 Hyperlipidemia, unspecified: Secondary | ICD-10-CM | POA: Diagnosis not present

## 2014-09-18 DIAGNOSIS — F419 Anxiety disorder, unspecified: Secondary | ICD-10-CM | POA: Diagnosis not present

## 2014-09-18 DIAGNOSIS — R0789 Other chest pain: Secondary | ICD-10-CM

## 2014-09-18 DIAGNOSIS — G4733 Obstructive sleep apnea (adult) (pediatric): Secondary | ICD-10-CM | POA: Diagnosis not present

## 2014-09-18 DIAGNOSIS — I2510 Atherosclerotic heart disease of native coronary artery without angina pectoris: Secondary | ICD-10-CM | POA: Insufficient documentation

## 2014-09-18 DIAGNOSIS — R42 Dizziness and giddiness: Secondary | ICD-10-CM | POA: Insufficient documentation

## 2014-09-18 LAB — COMPREHENSIVE METABOLIC PANEL
ALBUMIN: 3.9 g/dL (ref 3.5–5.2)
ALT: 21 U/L (ref 0–53)
AST: 25 U/L (ref 0–37)
Alkaline Phosphatase: 51 U/L (ref 39–117)
Anion gap: 9 (ref 5–15)
BILIRUBIN TOTAL: 0.6 mg/dL (ref 0.3–1.2)
BUN: 10 mg/dL (ref 6–23)
CALCIUM: 9.4 mg/dL (ref 8.4–10.5)
CHLORIDE: 103 mmol/L (ref 96–112)
CO2: 26 mmol/L (ref 19–32)
CREATININE: 1.25 mg/dL (ref 0.50–1.35)
GFR calc Af Amer: 73 mL/min — ABNORMAL LOW (ref >90)
GFR, EST NON AFRICAN AMERICAN: 63 mL/min — AB (ref >90)
GLUCOSE: 109 mg/dL — AB (ref 70–99)
Potassium: 4 mmol/L (ref 3.5–5.1)
SODIUM: 138 mmol/L (ref 135–145)
Total Protein: 7.4 g/dL (ref 6.0–8.3)

## 2014-09-18 LAB — CBC WITH DIFFERENTIAL/PLATELET
Basophils Absolute: 0.1 10*3/uL (ref 0.0–0.1)
Basophils Relative: 1 % (ref 0–1)
Eosinophils Absolute: 0.5 10*3/uL (ref 0.0–0.7)
Eosinophils Relative: 5 % (ref 0–5)
HCT: 40.5 % (ref 39.0–52.0)
Hemoglobin: 14.3 g/dL (ref 13.0–17.0)
LYMPHS PCT: 26 % (ref 12–46)
Lymphs Abs: 2.6 10*3/uL (ref 0.7–4.0)
MCH: 32.4 pg (ref 26.0–34.0)
MCHC: 35.3 g/dL (ref 30.0–36.0)
MCV: 91.6 fL (ref 78.0–100.0)
Monocytes Absolute: 1.2 10*3/uL — ABNORMAL HIGH (ref 0.1–1.0)
Monocytes Relative: 12 % (ref 3–12)
Neutro Abs: 5.7 10*3/uL (ref 1.7–7.7)
Neutrophils Relative %: 56 % (ref 43–77)
PLATELETS: 218 10*3/uL (ref 150–400)
RBC: 4.42 MIL/uL (ref 4.22–5.81)
RDW: 12.3 % (ref 11.5–15.5)
WBC: 9.9 10*3/uL (ref 4.0–10.5)

## 2014-09-18 LAB — I-STAT TROPONIN, ED: TROPONIN I, POC: 0 ng/mL (ref 0.00–0.08)

## 2014-09-18 LAB — D-DIMER, QUANTITATIVE (NOT AT ARMC): D DIMER QUANT: 0.33 ug{FEU}/mL (ref 0.00–0.48)

## 2014-09-18 MED ORDER — OXYCODONE-ACETAMINOPHEN 5-325 MG PO TABS
1.0000 | ORAL_TABLET | Freq: Once | ORAL | Status: AC
  Administered 2014-09-18: 1 via ORAL
  Filled 2014-09-18: qty 1

## 2014-09-18 MED ORDER — DIPHENHYDRAMINE HCL 50 MG/ML IJ SOLN
12.5000 mg | Freq: Four times a day (QID) | INTRAMUSCULAR | Status: DC | PRN
  Administered 2014-09-18: 12.5 mg via INTRAVENOUS
  Filled 2014-09-18: qty 1

## 2014-09-18 MED ORDER — MORPHINE SULFATE 4 MG/ML IJ SOLN
4.0000 mg | Freq: Once | INTRAMUSCULAR | Status: DC
  Filled 2014-09-18: qty 1

## 2014-09-18 MED ORDER — MORPHINE SULFATE 4 MG/ML IJ SOLN: 4.0000 mg | Freq: Once | INTRAMUSCULAR | Status: DC

## 2014-09-18 MED ORDER — OXYCODONE-ACETAMINOPHEN 5-325 MG PO TABS
1.0000 | ORAL_TABLET | Freq: Once | ORAL | Status: AC
Start: 2014-09-18 — End: 2014-09-18
  Administered 2014-09-18: 1 via ORAL
  Filled 2014-09-18: qty 1

## 2014-09-18 MED ORDER — MORPHINE SULFATE 4 MG/ML IJ SOLN
8.0000 mg | Freq: Once | INTRAMUSCULAR | Status: AC
  Administered 2014-09-18: 8 mg via INTRAVENOUS
  Filled 2014-09-18: qty 2

## 2014-09-18 MED ORDER — MORPHINE SULFATE 2 MG/ML IJ SOLN
1.0000 mg | Freq: Four times a day (QID) | INTRAMUSCULAR | Status: DC | PRN
  Administered 2014-09-18 – 2014-09-19 (×2): 1 mg via INTRAVENOUS
  Filled 2014-09-18 (×2): qty 1

## 2014-09-18 NOTE — Progress Notes (Signed)
Patient walked into office with c/o chest pain and pressure that's radiating down his left arm and into his neck. Patient said he took 3 nitroglycerins before coming to the office that helped some. Patient also c/o sob, and dizziness. Nurse advised patient that he needed to go to the ED for an evaluation. Nurse offered to call EMS for patient but he said that he would have his wife pick him up and transport him to the ED. MD aware.

## 2014-09-18 NOTE — H&P (Addendum)
[name] is an 59 y.o. male.    Hasanje (pcp)   Chief Complaint: chest pain HPI: 59 yo male with CAD c/o cp starting on Friday.  "heaviness" sscp and radiation to the left side of the chest and to the neck.  + sob.  +diaphoretic. Slight lightheaded.  Pain got worse starting 9:30 am, took nitro with some benefit. Pt notes that he was admitted to [facility] recently with + stress test. Since pain worsening, told to go [facility] ED for evaluation by his cardiologist  Past Medical History  Diagnosis Date  . COPD (chronic obstructive pulmonary disease)   . Essential hypertension, benign   . Chronic back pain   . Anxiety disorder   . Hyperlipidemia   . Depression   . Obesity   . History of alcohol abuse   . History of cocaine abuse   . Pneumonia   . GERD (gastroesophageal reflux disease)   . Headache(784.0)   . Arthritis   . CAD (coronary artery disease)     a. ~ 2000 cath at [facility];  b. 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    Past Surgical History  Procedure Laterality Date  . Hip surgery      rt  . Cholecystectomy    . Back surgery    . Nasal/sinus endoscopy    . Total hip arthroplasty  05/08/2012    Procedure: TOTAL HIP ARTHROPLASTY;  Surgeon: FKerin Salen MD;  [facility]  Service: Orthopedics;  Laterality: Left;  . Joint replacement    . Left heart catheterization with coronary angiogram N/A 09/12/2013    Procedure: LEFT HEART CATHETERIZATION WITH CORONARY ANGIOGRAM;  Surgeon: CBurnell Blanks MD;  [facility];  Service: Cardiovascular;  Laterality: N/A;    Family History  Problem Relation Age of Onset  . Heart attack Mother   . Heart attack Brother   . Heart attack Sister    Social History:  reports that he has been smoking Cigarettes.  He has a 9.75 pack-year smoking history. He has never used smokeless  tobacco. He reports that he does not drink alcohol or use illicit drugs.  Allergies:  Allergies  Allergen Reactions  . Ativan [Lorazepam] Other (See Comments)    "out of mind"  . Dilaudid [Hydromorphone Hcl] Other (See Comments)    " out of mind"  . Haloperidol Decanoate      (Not in a hospital admission)  Results for orders placed or performed during the hospital encounter of 09/18/14 (from the past 48 hour(s))  CBC with Differential     Status: Abnormal   Collection Time: 09/18/14  5:42 PM  Result Value Ref Range   WBC 9.9 4.0 - 10.5 K/uL   RBC 4.42 4.22 - 5.81 MIL/uL   Hemoglobin 14.3 13.0 - 17.0 g/dL   HCT 40.5 39.0 - 52.0 %   MCV 91.6 78.0 - 100.0 fL   MCH 32.4 26.0 - 34.0 pg   MCHC 35.3 30.0 - 36.0 g/dL   RDW 12.3 11.5 - 15.5 %   Platelets 218 150 - 400 K/uL   Neutrophils Relative % 56 43 - 77 %   Neutro Abs 5.7 1.7 - 7.7 K/uL   Lymphocytes Relative 26 12 - 46 %   Lymphs Abs 2.6 0.7 - 4.0 K/uL  Monocytes Relative 12 3 - 12 %   Monocytes Absolute 1.2 (H) 0.1 - 1.0 K/uL   Eosinophils Relative 5 0 - 5 %   Eosinophils Absolute 0.5 0.0 - 0.7 K/uL   Basophils Relative 1 0 - 1 %   Basophils Absolute 0.1 0.0 - 0.1 K/uL  Comprehensive metabolic panel     Status: Abnormal   Collection Time: 09/18/14  5:42 PM  Result Value Ref Range   Sodium 138 135 - 145 mmol/L   Potassium 4.0 3.5 - 5.1 mmol/L   Chloride 103 96 - 112 mmol/L   CO2 26 19 - 32 mmol/L   Glucose, Bld 109 (H) 70 - 99 mg/dL   BUN 10 6 - 23 mg/dL   Creatinine, Ser 1.25 0.50 - 1.35 mg/dL   Calcium 9.4 8.4 - 10.5 mg/dL   Total Protein 7.4 6.0 - 8.3 g/dL   Albumin 3.9 3.5 - 5.2 g/dL   AST 25 0 - 37 U/L   ALT 21 0 - 53 U/L   Alkaline Phosphatase 51 39 - 117 U/L   Total Bilirubin 0.6 0.3 - 1.2 mg/dL   GFR calc non Af Amer 63 (L) >90 mL/min   GFR calc Af Amer 73 (L) >90 mL/min    Comment: (NOTE) The eGFR has been calculated using the CKD EPI equation. This calculation has not been validated in all clinical  situations. eGFR's persistently <90 mL/min signify possible Chronic Kidney Disease.    Anion gap 9 5 - 15  D-dimer, quantitative     Status: None   Collection Time: 09/18/14  5:53 PM  Result Value Ref Range   D-Dimer, Quant 0.33 0.00 - 0.48 ug/mL-FEU    Comment:        AT THE INHOUSE ESTABLISHED CUTOFF VALUE OF 0.48 ug/mL FEU, THIS ASSAY HAS BEEN DOCUMENTED IN THE LITERATURE TO HAVE A SENSITIVITY AND NEGATIVE PREDICTIVE VALUE OF AT LEAST 98 TO 99%.  THE TEST RESULT SHOULD BE CORRELATED WITH AN ASSESSMENT OF THE CLINICAL PROBABILITY OF DVT / VTE.   I-Stat Troponin, ED (not at [facility])     Status: None   Collection Time: 09/18/14  5:58 PM  Result Value Ref Range   Troponin i, poc 0.00 0.00 - 0.08 ng/mL   Comment 3            Comment: Due to the release kinetics of cTnI, a negative result within the first hours of the onset of symptoms does not rule out myocardial infarction with certainty. If myocardial infarction is still suspected, repeat the test at appropriate intervals.    Dg Chest 2 View  09/18/2014   CLINICAL DATA:  Left chest pain, lungs pressure  EXAM: CHEST  2 VIEW  COMPARISON:  Radiograph 09/14/2014  FINDINGS: Normal mediastinum and cardiac silhouette. Chronic central bronchitic markings. Normal pulmonary vasculature. No effusion, infiltrate, or pneumothorax. Degenerative osteophytosis of the thoracic spine.  IMPRESSION: No interval change.  Chronic bronchitic markings.   Electronically Signed   By: Suzy Bouchard M.D.   On: 09/18/2014 18:31    Review of Systems  Constitutional: Negative for fever, chills, weight loss, malaise/fatigue and diaphoresis.  HENT: Negative for congestion, ear discharge, ear pain, hearing loss, nosebleeds, sore throat and tinnitus.   Eyes: Negative for blurred vision, double vision, photophobia, pain, discharge and redness.  Respiratory: Negative for cough, hemoptysis, sputum production, shortness of breath, wheezing and stridor.    Cardiovascular: Positive for chest pain. Negative for palpitations, orthopnea, claudication, leg swelling and  PND.  Gastrointestinal: Negative for heartburn, nausea, vomiting, abdominal pain, diarrhea, constipation, blood in stool and melena.  Genitourinary: Negative for dysuria, urgency, frequency, hematuria and flank pain.  Musculoskeletal: Negative for myalgias, back pain, joint pain, falls and neck pain.  Skin: Negative for itching and rash.  Neurological: Positive for dizziness. Negative for tingling, tremors, sensory change, speech change, focal weakness, seizures, loss of consciousness, weakness and headaches.  Endo/Heme/Allergies: Negative for environmental allergies and polydipsia. Does not bruise/bleed easily.  Psychiatric/Behavioral: Negative for depression, suicidal ideas, hallucinations, memory loss and substance abuse. The patient is not nervous/anxious and does not have insomnia.     Blood pressure 106/91, pulse 73, temperature 97.4 F (36.3 C), resp. rate 17, height '6\' 3"'  (1.905 m), weight 155.584 kg (343 lb), SpO2 96 %. Physical Exam  Constitutional: He is oriented to person, place, and time. He appears well-developed and well-nourished.  HENT:  Head: Normocephalic and atraumatic.  Eyes: Conjunctivae and EOM are normal. Pupils are equal, round, and reactive to light.  Neck: Normal range of motion. Neck supple. No JVD present. No tracheal deviation present. No thyromegaly present.  Cardiovascular: Normal rate and regular rhythm.  Exam reveals no gallop and no friction rub.   No murmur heard. Respiratory: Effort normal and breath sounds normal.  GI: Soft. Bowel sounds are normal. He exhibits no distension. There is no tenderness. There is no rebound and no guarding.  Musculoskeletal: Normal range of motion. He exhibits no edema or tenderness.  Lymphadenopathy:    He has no cervical adenopathy.  Neurological: He is alert and oriented to person, place, and time. He has normal  reflexes. He displays normal reflexes. No cranial nerve deficit. He exhibits normal muscle tone. Coordination normal.  Skin: Skin is warm and dry. No rash noted. No erythema. No pallor.  Slight pain with palpation  Psychiatric: He has a normal mood and affect. His behavior is normal. Judgment and thought content normal.     Assessment/Plan Chest pain Telemetry Check trop q6h x3 Check lipid Cardiology has been consulted by ED.  Requested medical admission,  Defer to cardiology regarding possible need for catheterization.  Cont aspirin, statin, b blocker, ranexa  Hyperglycemia Check hga1c  Hypertension: Cont current bp medication  OSA,  Pt usually doesn't use cpap    Jani Gravel 09/18/2014, 10:49 PM

## 2014-09-18 NOTE — ED Notes (Signed)
Pt to ED from cardiology office for evaluation of left sided chest pain that radiates down his left arm- pt reports pain has been intermittent since Friday.  Pt admits to going [facility] on Saturday for the same and being d/c.  Pt admits to taking 3 nitro both yesterday and today that "eased the pain some".  Pt speaking in full sentences at present, no acute distress noted.

## 2014-09-18 NOTE — ED Provider Notes (Signed)
CSN: [id number]     Arrival date & time 09/18/14  1731 History   First MD Initiated Contact with Patient 09/18/14 2005     Chief Complaint  Patient presents with  . Chest Pain     (Consider location/radiation/quality/duration/timing/severity/associated sxs/prior Treatment) HPI 59 year old male with past medical history as below notable for CAD, COPD who presents to ED complaining of 3 days of waxing and waning left-sided chest pain which he describes as achy. He also reports having diaphoresis, nausea, SOB. Patient reports he has been using nitroglycerin which has helped ease the pain some. Patient saw his cardiologist today who sent him to the ED for further evaluation. Patient states he was told he needed to have a heart catheterization. Patient took a baby aspirin this morning. Currently rates his pain as moderate-severe. Patient is a smoker and he says he has been wheezing. He also notes having mild lower extremity swelling worse on the left. No history of PE/DVT. Past Medical History  Diagnosis Date  . COPD (chronic obstructive pulmonary disease)   . Essential hypertension, benign   . Chronic back pain   . Anxiety disorder   . Hyperlipidemia   . Depression   . Obesity   . History of alcohol abuse   . History of cocaine abuse   . Pneumonia   . GERD (gastroesophageal reflux disease)   . Headache(784.0)   . Arthritis   . CAD (coronary artery disease)     a. ~ 2000 cath at [facility];  b. 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   Past Surgical History  Procedure Laterality Date  . Hip surgery      rt  . Cholecystectomy    . Back surgery    . Nasal/sinus endoscopy    . Total hip arthroplasty  05/08/2012    Procedure: TOTAL HIP ARTHROPLASTY;  Surgeon: Nestor Lewandowsky, MD;  Location: [facility] OR;  Service: Orthopedics;  Laterality: Left;  . Joint  replacement    . Left heart catheterization with coronary angiogram N/A 09/12/2013    Procedure: LEFT HEART CATHETERIZATION WITH CORONARY ANGIOGRAM;  Surgeon: Kathleene Hazel, MD;  Location: [facility] CATH LAB;  Service: Cardiovascular;  Laterality: N/A;   Family History  Problem Relation Age of Onset  . Heart attack Mother   . Heart attack Brother   . Heart attack Sister    History  Substance Use Topics  . Smoking status: Current Every Day Smoker -- 0.25 packs/day for 39 years    Types: Cigarettes  . Smokeless tobacco: Never Used  . Alcohol Use: No     Comment: denies    Review of Systems  Constitutional: Positive for diaphoresis. Negative for fever, activity change and appetite change.  HENT: Negative for congestion, rhinorrhea and sore throat.   Eyes: Negative for visual disturbance.  Respiratory: Positive for shortness of breath. Negative for cough.   Cardiovascular: Positive for chest pain. Negative for palpitations and leg swelling.  Gastrointestinal: Positive for nausea. Negative for vomiting, abdominal pain and diarrhea.  Genitourinary: Negative for dysuria, flank pain, decreased urine volume and difficulty urinating.  Musculoskeletal: Negative for back pain and neck pain.  Skin: Negative for rash.  Neurological: Negative for dizziness, syncope, speech difficulty, weakness, light-headedness, numbness and headaches.  Psychiatric/Behavioral: Negative for confusion.      Allergies  Ativan; Dilaudid;  and Haloperidol decanoate  Home Medications   Prior to Admission medications   Medication Sig Start Date End Date Taking? Authorizing Provider  ALPRAZolam Prudy Feeler) 1 MG tablet Take 1 tablet (1 mg total) by mouth 3 (three) times daily as needed for anxiety. For anxiety 05/15/12   Kathlen Mody, MD  amLODipine (NORVASC) 5 MG tablet Take 5 mg by mouth daily. 08/06/14   Historical Provider, MD  aspirin 81 MG tablet Take 1 tablet (81 mg total) by mouth daily. 05/31/12   Kathlen Mody, MD  atorvastatin (LIPITOR) 10 MG tablet TAKE 1 TABLET DAILY AT 6:00PM. 08/06/14   Jonelle Sidle, MD  budesonide-formoterol [facility]) 160-4.5 MCG/ACT inhaler Inhale 2 puffs into the lungs 2 (two) times daily.    Historical Provider, MD  citalopram (CELEXA) 40 MG tablet Take 40 mg by mouth daily.      Historical Provider, MD  doxepin (SINEQUAN) 25 MG capsule Take 25 mg by mouth at bedtime.  08/06/14   Historical Provider, MD  furosemide (LASIX) 40 MG tablet Take 1 tablet (40 mg total) by mouth daily. 12/06/13   Catarina Hartshorn, MD  hydrochlorothiazide (HYDRODIURIL) 25 MG tablet Take 25 mg by mouth daily. 08/06/14   Historical Provider, MD  ipratropium-albuterol (DUONEB) 0.5-2.5 (3) MG/3ML SOLN Take 3 mLs by nebulization every 6 (six) hours as needed (shortness of breath/ wheezing.).     Historical Provider, MD  isosorbide mononitrate (IMDUR) 30 MG 24 hr tablet Take 1 tablet (30 mg total) by mouth 2 (two) times daily. 08/12/14   Nishant Dhungel, MD  levofloxacin (LEVAQUIN) 750 MG tablet Take 1 tablet (750 mg total) by mouth daily. 08/12/14   Nishant Dhungel, MD  meloxicam (MOBIC) 15 MG tablet Take 1 tablet by mouth daily. 08/13/13   Historical Provider, MD  nicotine (NICODERM CQ) 21 mg/24hr patch Place 1 patch (21 mg total) onto the skin daily. 08/12/14   Nishant Dhungel, MD  nitroGLYCERIN (NITROSTAT) 0.4 MG SL tablet Place 0.4 mg under the tongue every 5 (five) minutes as needed. For chest pain    Historical Provider, MD  omeprazole (PRILOSEC) 20 MG capsule Take 20 mg by mouth daily.    Historical Provider, MD  oxyCODONE-acetaminophen (PERCOCET) 10-325 MG per tablet Take 1 tablet by mouth every 6 (six) hours as needed for pain.  05/22/12   Donnetta Hutching, MD  potassium chloride SA (K-DUR,KLOR-CON) 20 MEQ tablet Take 1 tablet by mouth daily. 08/13/13   Historical Provider, MD  predniSONE (DELTASONE) 20 MG tablet Take 3 tablets (60 mg total) by mouth 2 (two) times daily with a meal. Take 3 tablets two  times daily x 2 days; then 2 tablets ( ) two times daily x 2 days, then 1 tablet ( ) two times a day x 2 days 08/12/14   Nishant Dhungel, MD  PROAIR HFA 108 (90 BASE) MCG/ACT inhaler Inhale 2 puffs into the lungs daily as needed for wheezing or shortness of breath.  06/20/13   Historical Provider, MD  ranolazine (RANEXA) 500 MG 12 hr tablet Take 1 tablet (500 mg total) by mouth 2 (two) times daily. 08/12/14   Nishant Dhungel, MD  traZODone (DESYREL) 100 MG tablet Take 100 mg by mouth at bedtime.     Historical Provider, MD   BP 112/62 mmHg  Pulse 76  Temp(Src) 97.4 F (36.3 C)  Resp 18  Ht  (1.905 m)  Wt 343 lb (155.584 kg)  BMI 42.87 kg/m2  SpO2 98% Physical Exam  Constitutional: He is oriented  to person, place, and time. No distress.  Morbidly obese  HENT:  Head: Normocephalic and atraumatic.  Nose: Nose normal.  Mouth/Throat: Oropharynx is clear and moist. No oropharyngeal exudate.  Eyes: Conjunctivae and EOM are normal.  Neck: Normal range of motion. Neck supple. No JVD present.  Cardiovascular: Normal rate, regular rhythm, normal heart sounds and intact distal pulses.   Pulmonary/Chest: Effort normal. No respiratory distress. He has wheezes (occasional faint expiratory scattered wheezes).  Abdominal: Soft. He exhibits no distension. There is no tenderness. There is no rebound and no guarding.  Musculoskeletal: Normal range of motion.  Neurological: He is alert and oriented to person, place, and time. No cranial nerve deficit.  Skin: Skin is warm and dry. No rash noted. He is not diaphoretic.  Psychiatric: He has a normal mood and affect.    ED Course  Procedures (including critical care time) Labs Review Labs Reviewed  CBC WITH DIFFERENTIAL/PLATELET - Abnormal; Notable for the following:    Monocytes Absolute 1.2 (*)    All other components within normal limits  COMPREHENSIVE METABOLIC PANEL - Abnormal; Notable for the following:    Glucose, Bld 109 (*)    GFR  calc non Af Amer 63 (*)    GFR calc Af Amer 73 (*)    All other components within normal limits  D-DIMER, QUANTITATIVE  I-STAT TROPOININ, ED    Imaging Review Dg Chest 2 View  09/18/2014   CLINICAL DATA:  Left chest pain, lungs pressure  EXAM: CHEST  2 VIEW  COMPARISON:  Radiograph 09/14/2014  FINDINGS: Normal mediastinum and cardiac silhouette. Chronic central bronchitic markings. Normal pulmonary vasculature. No effusion, infiltrate, or pneumothorax. Degenerative osteophytosis of the thoracic spine.  IMPRESSION: No interval change.  Chronic bronchitic markings.   Electronically Signed   By: Genevive Bi M.D.   On: 09/18/2014 18:31     EKG Interpretation   Date/Time:  Wednesday September 18 2014 17:38:35 EST Ventricular Rate:  85 PR Interval:  186 QRS Duration: 96 QT Interval:  424 QTC Calculation: 504 R Axis:   50 Text Interpretation:  Normal sinus rhythm Septal infarct , age  undetermined Prolonged QT Abnormal ECG Confirmed by DOCHERTY  MD, MEGAN  [phone]) on 09/18/2014 8:49:10 PM      MDM   Final diagnoses:  None    Patient presents with 3 days of chest pain. H&P as above. He presents hemodynamically stable and in no apparent distress. EKG shows normal sinus rhythm and is unchanged from prior EKG. Patient had screening labs sent which have been unremarkable. Patient is having refractory chest pain despite narcotic pain medication. A d-dimer was sent and was negative. Given patient's significant cardiac history and referral from cardiology for further evaluation will consult cardiology who recs hospitalist admission and further mgmt.  Pt seen in conjunction with Dr. Toy Cookey, MD  Ames Dura, DO [facility] Emergency Medicine Resident - PGY-2    Ames Dura, MD 09/18/14 [id number]  Toy Cookey, MD 09/19/14 1145

## 2014-09-19 ENCOUNTER — Encounter (HOSPITAL_COMMUNITY): Payer: Self-pay | Admitting: *Deleted

## 2014-09-19 DIAGNOSIS — R079 Chest pain, unspecified: Secondary | ICD-10-CM | POA: Diagnosis not present

## 2014-09-19 DIAGNOSIS — J449 Chronic obstructive pulmonary disease, unspecified: Secondary | ICD-10-CM | POA: Diagnosis not present

## 2014-09-19 DIAGNOSIS — R61 Generalized hyperhidrosis: Secondary | ICD-10-CM | POA: Diagnosis not present

## 2014-09-19 DIAGNOSIS — R42 Dizziness and giddiness: Secondary | ICD-10-CM | POA: Diagnosis not present

## 2014-09-19 LAB — CK TOTAL AND CKMB (NOT AT ARMC)
CK TOTAL: 89 U/L (ref 7–232)
CK, MB: 1.3 ng/mL (ref 0.3–4.0)
CK, MB: 1.3 ng/mL (ref 0.3–4.0)
CK, MB: 1.2 ng/mL (ref 0.3–4.0)
Relative Index: INVALID (ref 0.0–2.5)
Relative Index: INVALID (ref 0.0–2.5)
Relative Index: INVALID (ref 0.0–2.5)
Total CK: 92 U/L (ref 7–232)
Total CK: 89 U/L (ref 7–232)

## 2014-09-19 LAB — TROPONIN I
Troponin I: <0.03 ng/mL (ref <0.031)
Troponin I: <0.03 ng/mL (ref <0.031)

## 2014-09-19 LAB — MRSA PCR SCREENING: MRSA BY PCR: NEGATIVE

## 2014-09-19 MED ORDER — SODIUM CHLORIDE 0.9 % IJ SOLN
3.0000 mL | Freq: Two times a day (BID) | INTRAMUSCULAR | Status: DC
  Administered 2014-09-19: 3 mL via INTRAVENOUS

## 2014-09-19 MED ORDER — BUDESONIDE-FORMOTEROL FUMARATE 160-4.5 MCG/ACT IN AERO
2.0000 | INHALATION_SPRAY | Freq: Two times a day (BID) | RESPIRATORY_TRACT | Status: DC
  Administered 2014-09-19: 2 via RESPIRATORY_TRACT
  Filled 2014-09-19: qty 6

## 2014-09-19 MED ORDER — ALBUTEROL SULFATE (2.5 MG/3ML) 0.083% IN NEBU: 2.5000 mg | INHALATION_SOLUTION | Freq: Every day | RESPIRATORY_TRACT | Status: DC | PRN

## 2014-09-19 MED ORDER — DOXEPIN HCL 25 MG PO CAPS
25.0000 mg | ORAL_CAPSULE | Freq: Every day | ORAL | Status: DC
  Administered 2014-09-19: 25 mg via ORAL
  Filled 2014-09-19 (×2): qty 1

## 2014-09-19 MED ORDER — NICOTINE 21 MG/24HR TD PT24
21.0000 mg | MEDICATED_PATCH | Freq: Every day | TRANSDERMAL | Status: DC
  Administered 2014-09-19: 21 mg via TRANSDERMAL
  Filled 2014-09-19: qty 1

## 2014-09-19 MED ORDER — MORPHINE SULFATE 2 MG/ML IJ SOLN: 2.0000 mg | Freq: Four times a day (QID) | INTRAMUSCULAR | Status: DC | PRN

## 2014-09-19 MED ORDER — NITROGLYCERIN 0.4 MG SL SUBL
SUBLINGUAL_TABLET | SUBLINGUAL | Status: AC
  Filled 2014-09-19: qty 1

## 2014-09-19 MED ORDER — CITALOPRAM HYDROBROMIDE 40 MG PO TABS
40.0000 mg | ORAL_TABLET | Freq: Every day | ORAL | Status: DC
  Administered 2014-09-19: 40 mg via ORAL
  Filled 2014-09-19: qty 1

## 2014-09-19 MED ORDER — ACETAMINOPHEN 650 MG RE SUPP: 650.0000 mg | Freq: Four times a day (QID) | RECTAL | Status: DC | PRN

## 2014-09-19 MED ORDER — MORPHINE SULFATE 2 MG/ML IJ SOLN
2.0000 mg | INTRAMUSCULAR | Status: DC | PRN
  Administered 2014-09-19 (×2): 2 mg via INTRAVENOUS
  Filled 2014-09-19 (×2): qty 1

## 2014-09-19 MED ORDER — HYDROCHLOROTHIAZIDE 25 MG PO TABS
25.0000 mg | ORAL_TABLET | Freq: Every day | ORAL | Status: DC
  Administered 2014-09-19: 25 mg via ORAL
  Filled 2014-09-19: qty 1

## 2014-09-19 MED ORDER — MORPHINE SULFATE 2 MG/ML IJ SOLN
2.0000 mg | Freq: Once | INTRAMUSCULAR | Status: AC
  Administered 2014-09-19: 2 mg via INTRAVENOUS

## 2014-09-19 MED ORDER — MORPHINE SULFATE 2 MG/ML IJ SOLN
INTRAMUSCULAR | Status: AC
  Filled 2014-09-19: qty 1

## 2014-09-19 MED ORDER — ACETAMINOPHEN 325 MG PO TABS
650.0000 mg | ORAL_TABLET | Freq: Four times a day (QID) | ORAL | Status: DC | PRN
Start: 2014-09-19 — End: 2014-09-19

## 2014-09-19 MED ORDER — RANOLAZINE ER 500 MG PO TB12
500.0000 mg | ORAL_TABLET | Freq: Two times a day (BID) | ORAL | Status: DC
  Administered 2014-09-19 (×2): 500 mg via ORAL
  Filled 2014-09-19 (×3): qty 1

## 2014-09-19 MED ORDER — POTASSIUM CHLORIDE CRYS ER 20 MEQ PO TBCR
20.0000 meq | EXTENDED_RELEASE_TABLET | Freq: Two times a day (BID) | ORAL | Status: DC
  Administered 2014-09-19 (×2): 20 meq via ORAL
  Filled 2014-09-19 (×3): qty 1

## 2014-09-19 MED ORDER — ALPRAZOLAM 0.5 MG PO TABS
1.0000 mg | ORAL_TABLET | Freq: Three times a day (TID) | ORAL | Status: DC | PRN
  Administered 2014-09-19: 1 mg via ORAL
  Filled 2014-09-19: qty 4

## 2014-09-19 MED ORDER — ATORVASTATIN CALCIUM 10 MG PO TABS
10.0000 mg | ORAL_TABLET | Freq: Every day | ORAL | Status: DC
  Filled 2014-09-19: qty 1

## 2014-09-19 MED ORDER — ENOXAPARIN SODIUM 40 MG/0.4ML ~~LOC~~ SOLN
40.0000 mg | Freq: Every day | SUBCUTANEOUS | Status: DC
  Administered 2014-09-19: 40 mg via SUBCUTANEOUS
  Filled 2014-09-19: qty 0.4

## 2014-09-19 MED ORDER — ISOSORBIDE MONONITRATE ER 30 MG PO TB24
30.0000 mg | ORAL_TABLET | Freq: Every day | ORAL | Status: DC
  Administered 2014-09-19: 30 mg via ORAL
  Filled 2014-09-19: qty 1

## 2014-09-19 MED ORDER — IPRATROPIUM-ALBUTEROL 0.5-2.5 (3) MG/3ML IN SOLN
3.0000 mL | Freq: Four times a day (QID) | RESPIRATORY_TRACT | Status: DC | PRN
  Administered 2014-09-19: 3 mL via RESPIRATORY_TRACT
  Filled 2014-09-19: qty 3

## 2014-09-19 MED ORDER — MELOXICAM 15 MG PO TABS
15.0000 mg | ORAL_TABLET | Freq: Every day | ORAL | Status: DC
  Administered 2014-09-19: 15 mg via ORAL
  Filled 2014-09-19 (×2): qty 1

## 2014-09-19 MED ORDER — PANTOPRAZOLE SODIUM 40 MG PO TBEC
40.0000 mg | DELAYED_RELEASE_TABLET | Freq: Every day | ORAL | Status: DC
  Administered 2014-09-19: 40 mg via ORAL
  Filled 2014-09-19: qty 1

## 2014-09-19 MED ORDER — TRAZODONE HCL 100 MG PO TABS
100.0000 mg | ORAL_TABLET | Freq: Every day | ORAL | Status: DC
  Administered 2014-09-19: 100 mg via ORAL
  Filled 2014-09-19 (×2): qty 1

## 2014-09-19 MED ORDER — FUROSEMIDE 40 MG PO TABS
40.0000 mg | ORAL_TABLET | Freq: Every day | ORAL | Status: DC
  Administered 2014-09-19: 40 mg via ORAL
  Filled 2014-09-19: qty 1

## 2014-09-19 MED ORDER — AMLODIPINE BESYLATE 5 MG PO TABS
5.0000 mg | ORAL_TABLET | Freq: Every day | ORAL | Status: DC
  Administered 2014-09-19: 5 mg via ORAL
  Filled 2014-09-19: qty 1

## 2014-09-19 MED ORDER — NITROGLYCERIN 0.4 MG SL SUBL
0.4000 mg | SUBLINGUAL_TABLET | SUBLINGUAL | Status: DC | PRN
  Administered 2014-09-19 (×3): 0.4 mg via SUBLINGUAL
  Filled 2014-09-19: qty 1

## 2014-09-19 MED ORDER — MORPHINE SULFATE 4 MG/ML IJ SOLN
4.0000 mg | Freq: Once | INTRAMUSCULAR | Status: AC
Start: 2014-09-19 — End: 2014-09-19
  Administered 2014-09-19: 4 mg via INTRAVENOUS
  Filled 2014-09-19: qty 1

## 2014-09-19 MED ORDER — SODIUM CHLORIDE 0.9 % IV SOLN
INTRAVENOUS | Status: AC
  Administered 2014-09-19: 50 mL/h via INTRAVENOUS

## 2014-09-19 MED ORDER — ASPIRIN EC 81 MG PO TBEC
81.0000 mg | DELAYED_RELEASE_TABLET | Freq: Every day | ORAL | Status: DC
  Administered 2014-09-19: 81 mg via ORAL
  Filled 2014-09-19: qty 1

## 2014-09-19 NOTE — Discharge Summary (Signed)
Physician Discharge Summary  [name] UJW:[id number] DOB: 12-Mar-1956 DOA: 09/18/2014  PCP: [name], MD  Admit date: 09/18/2014 Discharge date: 09/19/2014  Time spent: 10  minutes  Recommendations for Outpatient Follow-up:  NONE, LEFT AGAINST MEDICAL ADVICE Patient understands risks, including death, and has opted to leave.   Discharge Diagnoses:  Active Problems:   Essential hypertension, benign   Chest pain   Pain in the chest   Hyperglycemia   Discharge Condition: NONE, LEFT AMA  Diet recommendation: NONE, LEFT AMA  Filed Weights   09/18/14 1740 09/19/14 0050  Weight: 155.584 kg (343 lb) 153.452 kg (338 lb 4.8 oz)    History of present illness:  on 09/18/2014 by Dr. Pearson Grippe 59 yo male with CAD c/o cp starting on Friday. "heaviness" sscp and radiation to the left side of the chest and to the neck. + sob. +diaphoretic. Slight lightheaded. Pain got worse starting 9:30 am, took nitro with some benefit. Pt notes that he was admitted to [facility] recently with + stress test. Since pain worsening, told to go [facility] ED for evaluation by his cardiologist  Hospital Course:  Patient left AGAINST MEDICAL ADVICE.  Atypical Chest pain -Ongoing since last Friday, tenderness to chest wall with palpation -Troponins negative thus far, will continue to cycle -Cardiology consulted and appreciated -Continue Aspirin, imdur, ranexa, statin -PATIENT LEFT AGAINST MEDICAL ADVICE BEFORE SEEN BY CARDIOLOGY  Hypertension -Controlled, continue amlodipine, lasix, HCTZ  Obstructive sleep apnea -Patient noncompliant and does not use CPAP  Hyperglycemia -HbA1c pending  Depression/Anxiety -Continue Celexa, doxepin  Tobacco Abuse -Patient counseled on smoking cessation -Continue nicotine patch  Procedures:  None  Consultations:  Cardiology  Discharge Exam: Filed Vitals:   09/19/14 0328  BP: 124/70  Pulse: 70  Temp: 97.8 F (36.6 C)  Resp:     No Physical Exam  upon discharge as patient let AMA.  Discharge Instructions     Medication List    ASK your doctor about these medications        ALPRAZolam 1 MG tablet  Commonly known as:  XANAX  Take 1 tablet (1 mg total) by mouth 3 (three) times daily as needed for anxiety. For anxiety     amLODipine 5 MG tablet  Commonly known as:  NORVASC  Take 5 mg by mouth daily.     aspirin 81 MG tablet  Take 1 tablet (81 mg total) by mouth daily.     atorvastatin 10 MG tablet  Commonly known as:  LIPITOR  TAKE 1 TABLET DAILY AT 6:00PM.     budesonide-formoterol 160-4.5 MCG/ACT inhaler  Commonly known as:  SYMBICORT  Inhale 2 puffs into the lungs 2 (two) times daily.     citalopram 40 MG tablet  Commonly known as:  CELEXA  Take 40 mg by mouth daily.     doxepin 25 MG capsule  Commonly known as:  SINEQUAN  Take 25 mg by mouth at bedtime.     furosemide 40 MG tablet  Commonly known as:  LASIX  Take 1 tablet (40 mg total) by mouth daily.     hydrochlorothiazide 25 MG tablet  Commonly known as:  HYDRODIURIL  Take 25 mg by mouth daily.     ipratropium-albuterol 0.5-2.5 (3) MG/3ML Soln  Commonly known as:  DUONEB  Take 3 mLs by nebulization every 6 (six) hours as needed (shortness of breath/ wheezing.).     isosorbide mononitrate 30 MG 24 hr tablet  Commonly known as:  IMDUR  Take 1 tablet (30 mg total) by mouth 2 (two) times daily.     levofloxacin 750 MG tablet  Commonly known as:  LEVAQUIN  Take 1 tablet (750 mg total) by mouth daily.     meloxicam 15 MG tablet  Commonly known as:  MOBIC  Take 15 mg by mouth daily.     nicotine 21 mg/24hr patch  Commonly known as:  NICODERM CQ  Place 1 patch (21 mg total) onto the skin daily.     nitroGLYCERIN 0.4 MG SL tablet  Commonly known as:  NITROSTAT  Place 0.4 mg under the tongue every 5 (five) minutes as needed. For chest pain     omeprazole 20 MG capsule  Commonly known as:  PRILOSEC  Take 20 mg by mouth daily.     potassium  chloride SA 20 MEQ tablet  Commonly known as:  K-DUR,KLOR-CON  Take 20 mEq by mouth 2 (two) times daily.     predniSONE 20 MG tablet  Commonly known as:  DELTASONE  Take 3 tablets (60 mg total) by mouth 2 (two) times daily with a meal. Take 3 tablets two times daily x 2 days; then 2 tablets (40mg ) two times daily x 2 days, then 1 tablet (20mg ) two times a day x 2 days     PROAIR HFA 108 (90 BASE) MCG/ACT inhaler  Generic drug:  albuterol  Inhale 2 puffs into the lungs daily as needed for wheezing or shortness of breath.     ranolazine 500 MG 12 hr tablet  Commonly known as:  RANEXA  Take 1 tablet (500 mg total) by mouth 2 (two) times daily.     traZODone 100 MG tablet  Commonly known as:  DESYREL  Take 100 mg by mouth at bedtime.       Allergies  Allergen Reactions  . Ativan [Lorazepam] Other (See Comments)    "out of mind"  . Dilaudid [Hydromorphone Hcl] Other (See Comments)    " out of mind"  . Haloperidol Decanoate       The results of significant diagnostics from this hospitalization (including imaging, microbiology, ancillary and laboratory) are listed below for reference.    Significant Diagnostic Studies: Dg Chest 2 View  09/18/2014   CLINICAL DATA:  Left chest pain, lungs pressure  EXAM: CHEST  2 VIEW  COMPARISON:  Radiograph 09/14/2014  FINDINGS: Normal mediastinum and cardiac silhouette. Chronic central bronchitic markings. Normal pulmonary vasculature. No effusion, infiltrate, or pneumothorax. Degenerative osteophytosis of the thoracic spine.  IMPRESSION: No interval change.  Chronic bronchitic markings.   Electronically Signed   By: Genevive Bi M.D.   On: 09/18/2014 18:31    Microbiology: Recent Results (from the past 240 hour(s))  MRSA PCR Screening     Status: None   Collection Time: 09/19/14  1:44 AM  Result Value Ref Range Status   MRSA by PCR NEGATIVE NEGATIVE Final    Comment:        The GeneXpert MRSA Assay (FDA approved for NASAL  specimens only), is one component of a comprehensive MRSA colonization surveillance program. It is not intended to diagnose MRSA infection nor to guide or monitor treatment for MRSA infections.      Labs: Basic Metabolic Panel:  Recent Labs Lab 09/18/14 1742  NA 138  K 4.0  CL 103  CO2 26  GLUCOSE 109*  BUN 10  CREATININE 1.25  CALCIUM 9.4   Liver Function Tests:  Recent Labs Lab 09/18/14 1742  AST 25  ALT 21  ALKPHOS 51  BILITOT 0.6  PROT 7.4  ALBUMIN 3.9   No results for input(s): LIPASE, AMYLASE in the last 168 hours. No results for input(s): AMMONIA in the last 168 hours. CBC:  Recent Labs Lab 09/18/14 1742  WBC 9.9  NEUTROABS 5.7  HGB 14.3  HCT 40.5  MCV 91.6  PLT 218   Cardiac Enzymes:  Recent Labs Lab 09/19/14 0130 09/19/14 0735  CKTOTAL 89 92  CKMB 1.3 1.3  TROPONINI <0.03 <0.03   BNP: BNP (last 3 results)  Recent Labs  12/05/13 0146 08/08/14 1755 08/09/14 0314  PROBNP 21.1 27.9 29.7   CBG: No results for input(s): GLUCAP in the last 168 hours.     SignedEdsel Petrin  [facility] 09/19/2014, 1:14 PM

## 2014-09-19 NOTE — Progress Notes (Signed)
[facility]                                                                              Patient Demographics  [name], is a 59 y.o. male, DOB - 1955/11/03, ZOX:[id number]  Admit date - 09/18/2014   Admitting Physician Pearson Grippe, MD  Outpatient Primary MD for the patient is [name] Deiters, MD  LOS - 1   Chief Complaint  Patient presents with  . Chest Pain      HPI on 09/18/2014 by Dr. Pearson Grippe 59 yo male with CAD c/o cp starting on Friday. "heaviness" sscp and radiation to the left side of the chest and to the neck. + sob. +diaphoretic. Slight lightheaded. Pain got worse starting 9:30 am, took nitro with some benefit. Pt notes that he was admitted to [facility] recently with + stress test. Since pain worsening, told to go [facility] ED for evaluation by his cardiologist Assessment & Plan   Atypical Chest pain -Ongoing since last Friday, tenderness to chest wall with palpation -Troponins negative thus far, will continue to cycle -Cardiology consulted and appreciated -Continue Aspirin, imdur, ranexa, statin  Hypertension -Controlled, continue amlodipine, lasix, HCTZ  Obstructive sleep apnea -Patient noncompliant and does not use CPAP  Hyperglycemia -HbA1c pending  Depression/Anxiety -Continue Celexa, doxepin  Tobacco Abuse -Patient counseled on smoking cessation -Continue nicotine patch  Code Status: Full  Family Communication: None at bedside  Disposition Plan: Admitted for observation, pending cardiology consult  Time Spent in minutes   30 minutes  Procedures  None  Consults   Cardiology  DVT Prophylaxis  Lovenox  Lab Results  Component Value Date   PLT 218 09/18/2014    Medications  Scheduled Meds: . amLODipine  5 mg Oral Daily  . aspirin EC  81 mg Oral Daily  . atorvastatin  10 mg Oral q1800  . budesonide-formoterol  2 puff Inhalation BID  . citalopram  40 mg Oral Daily  . doxepin  25 mg Oral QHS  . enoxaparin (LOVENOX) injection  40  mg Subcutaneous Daily  . furosemide  40 mg Oral Daily  . hydrochlorothiazide  25 mg Oral Daily  . isosorbide mononitrate  30 mg Oral Daily  . meloxicam  15 mg Oral Q breakfast  . morphine      . nicotine  21 mg Transdermal Daily  . nitroGLYCERIN      . pantoprazole  40 mg Oral Daily  . potassium chloride SA  20 mEq Oral BID  . ranolazine  500 mg Oral BID  . sodium chloride  3 mL Intravenous Q12H  . traZODone  100 mg Oral QHS   Continuous Infusions: . sodium chloride 50 mL/hr (09/19/14 0122)   PRN Meds:.acetaminophen **OR** acetaminophen, albuterol, ALPRAZolam, diphenhydrAMINE, ipratropium-albuterol, morphine injection, nitroGLYCERIN  Antibiotics    Anti-infectives    None        Subjective:   [name] seen and examined today.  Patient continues to complain of pain, however, vitals remain stable.  Patient states he has had chest pain since Friday, 09/13/2014, associated with SOB, nausea, and diaphoresis.  Patient feels his pain is dull at this time.   Objective:   Filed Vitals:  09/19/14 0050 09/19/14 0205 09/19/14 0328 09/19/14 0928  BP: 145/65 99/70 124/70   Pulse: 68  70   Temp: 97.7 F (36.5 C)  97.8 F (36.6 C)   TempSrc: Oral  Oral   Resp: 16     Height:  (1.905 m)     Weight: 153.452 kg (338 lb 4.8 oz)     SpO2: 97%  97% 98%    Wt Readings from Last 3 Encounters:  09/19/14 153.452 kg (338 lb 4.8 oz)  08/12/14 161.843 kg (356 lb 12.8 oz)  12/07/13 158.5 kg (349 lb 6.9 oz)    No intake or output data in the 24 hours ending 09/19/14 1050  Exam  General: Well developed, well nourished, NAD, appears stated age  HEENT: NCAT,  mucous membranes moist.   Cardiovascular: S1 S2 auscultated, no rubs, murmurs or gallops. Regular rate and rhythm.  Respiratory: Clear to auscultation bilaterally with equal chest rise  Abdomen: Soft, obese, nontender, nondistended, + bowel sounds  Extremities: warm dry without cyanosis clubbing or edema  Neuro:  AAOx3, nonfocal  Skin: Without rashes exudates or nodules, multiple tattoos   Psych: Anxious  Data Review   Micro Results Recent Results (from the past 240 hour(s))  MRSA PCR Screening     Status: None   Collection Time: 09/19/14  1:44 AM  Result Value Ref Range Status   MRSA by PCR NEGATIVE NEGATIVE Final    Comment:        The GeneXpert MRSA Assay (FDA approved for NASAL specimens only), is one component of a comprehensive MRSA colonization surveillance program. It is not intended to diagnose MRSA infection nor to guide or monitor treatment for MRSA infections.     Radiology Reports Dg Chest 2 View  09/18/2014   CLINICAL DATA:  Left chest pain, lungs pressure  EXAM: CHEST  2 VIEW  COMPARISON:  Radiograph 09/14/2014  FINDINGS: Normal mediastinum and cardiac silhouette. Chronic central bronchitic markings. Normal pulmonary vasculature. No effusion, infiltrate, or pneumothorax. Degenerative osteophytosis of the thoracic spine.  IMPRESSION: No interval change.  Chronic bronchitic markings.   Electronically Signed   By: Genevive Bi M.D.   On: 09/18/2014 18:31    CBC  Recent Labs Lab 09/18/14 1742  WBC 9.9  HGB 14.3  HCT 40.5  PLT 218  MCV 91.6  MCH 32.4  MCHC 35.3  RDW 12.3  LYMPHSABS 2.6  MONOABS 1.2*  EOSABS 0.5  BASOSABS 0.1    Chemistries   Recent Labs Lab 09/18/14 1742  NA 138  K 4.0  CL 103  CO2 26  GLUCOSE 109*  BUN 10  CREATININE 1.25  CALCIUM 9.4  AST 25  ALT 21  ALKPHOS 51  BILITOT 0.6   ------------------------------------------------------------------------------------------------------------------ estimated creatinine clearance is 104.6 mL/min (by C-G formula based on Cr of 1.25). ------------------------------------------------------------------------------------------------------------------ No results for input(s): HGBA1C in the last 72  hours. ------------------------------------------------------------------------------------------------------------------ No results for input(s): CHOL, HDL, LDLCALC, TRIG, CHOLHDL, LDLDIRECT in the last 72 hours. ------------------------------------------------------------------------------------------------------------------ No results for input(s): TSH, T4TOTAL, T3FREE, THYROIDAB in the last 72 hours.  Invalid input(s): FREET3 ------------------------------------------------------------------------------------------------------------------ No results for input(s): VITAMINB12, FOLATE, FERRITIN, TIBC, IRON, RETICCTPCT in the last 72 hours.  Coagulation profile No results for input(s): INR, PROTIME in the last 168 hours.   Recent Labs  09/18/14 1753  DDIMER 0.33    Cardiac Enzymes  Recent Labs Lab 09/19/14 0130 09/19/14 0735  CKMB 1.3 1.3  TROPONINI <0.03 <0.03   ------------------------------------------------------------------------------------------------------------------ Invalid input(s): POCBNP  Laraya Pestka D.O. on 09/19/2014 at 10:50 AM  Between 7am to 7pm - Pager - [phone]  After 7pm go to [email or web address] - password [email or web address]  And look for the night coverage person covering for me after hours  [facility] Office  [phone]

## 2014-09-20 LAB — HEMOGLOBIN A1C
HEMOGLOBIN A1C: 5.9 % — AB (ref 4.8–5.6)
Mean Plasma Glucose: 123 mg/dL

## 2014-09-26 ENCOUNTER — Encounter: Payer: Medicare Other | Admitting: Cardiology

## 2014-10-14 ENCOUNTER — Telehealth: Payer: Self-pay | Admitting: *Deleted

## 2014-10-14 MED ORDER — ATORVASTATIN CALCIUM 10 MG PO TABS: 10.0000 mg | ORAL_TABLET | Freq: Every day | ORAL | Status: DC

## 2014-10-14 NOTE — Telephone Encounter (Signed)
Atorvastatin refilled today.  Follow up already scheduled for 11/25/14 with Dr. Diona Browner.

## 2014-10-18 ENCOUNTER — Other Ambulatory Visit (HOSPITAL_COMMUNITY): Payer: Self-pay

## 2014-10-18 ENCOUNTER — Emergency Department (HOSPITAL_COMMUNITY): Payer: Medicare HMO

## 2014-10-18 ENCOUNTER — Observation Stay (HOSPITAL_COMMUNITY)
Admission: EM | Admit: 2014-10-18 | Discharge: 2014-10-20 | Disposition: A | Discharge status: Home / Self Care | Payer: Medicare HMO | Attending: Internal Medicine | Admitting: Internal Medicine

## 2014-10-18 ENCOUNTER — Encounter (HOSPITAL_COMMUNITY): Payer: Self-pay | Admitting: *Deleted

## 2014-10-18 ENCOUNTER — Other Ambulatory Visit: Payer: Self-pay

## 2014-10-18 DIAGNOSIS — Z8701 Personal history of pneumonia (recurrent): Secondary | ICD-10-CM | POA: Diagnosis not present

## 2014-10-18 DIAGNOSIS — R079 Chest pain, unspecified: Secondary | ICD-10-CM | POA: Diagnosis present

## 2014-10-18 DIAGNOSIS — E669 Obesity, unspecified: Secondary | ICD-10-CM | POA: Insufficient documentation

## 2014-10-18 DIAGNOSIS — Z720 Tobacco use: Secondary | ICD-10-CM | POA: Insufficient documentation

## 2014-10-18 DIAGNOSIS — Z791 Long term (current) use of non-steroidal anti-inflammatories (NSAID): Secondary | ICD-10-CM | POA: Insufficient documentation

## 2014-10-18 DIAGNOSIS — F419 Anxiety disorder, unspecified: Secondary | ICD-10-CM | POA: Diagnosis not present

## 2014-10-18 DIAGNOSIS — J449 Chronic obstructive pulmonary disease, unspecified: Secondary | ICD-10-CM | POA: Diagnosis not present

## 2014-10-18 DIAGNOSIS — I2510 Atherosclerotic heart disease of native coronary artery without angina pectoris: Secondary | ICD-10-CM | POA: Diagnosis not present

## 2014-10-18 DIAGNOSIS — E785 Hyperlipidemia, unspecified: Secondary | ICD-10-CM | POA: Diagnosis not present

## 2014-10-18 DIAGNOSIS — K219 Gastro-esophageal reflux disease without esophagitis: Secondary | ICD-10-CM | POA: Diagnosis not present

## 2014-10-18 DIAGNOSIS — Z9049 Acquired absence of other specified parts of digestive tract: Secondary | ICD-10-CM | POA: Diagnosis not present

## 2014-10-18 DIAGNOSIS — I10 Essential (primary) hypertension: Secondary | ICD-10-CM | POA: Diagnosis not present

## 2014-10-18 DIAGNOSIS — Z9889 Other specified postprocedural states: Secondary | ICD-10-CM | POA: Insufficient documentation

## 2014-10-18 DIAGNOSIS — M542 Cervicalgia: Secondary | ICD-10-CM

## 2014-10-18 DIAGNOSIS — G8929 Other chronic pain: Secondary | ICD-10-CM | POA: Insufficient documentation

## 2014-10-18 DIAGNOSIS — Z79899 Other long term (current) drug therapy: Secondary | ICD-10-CM | POA: Insufficient documentation

## 2014-10-18 DIAGNOSIS — J441 Chronic obstructive pulmonary disease with (acute) exacerbation: Secondary | ICD-10-CM | POA: Diagnosis present

## 2014-10-18 DIAGNOSIS — M1990 Unspecified osteoarthritis, unspecified site: Secondary | ICD-10-CM | POA: Insufficient documentation

## 2014-10-18 DIAGNOSIS — Z7982 Long term (current) use of aspirin: Secondary | ICD-10-CM | POA: Insufficient documentation

## 2014-10-18 DIAGNOSIS — F329 Major depressive disorder, single episode, unspecified: Secondary | ICD-10-CM | POA: Insufficient documentation

## 2014-10-18 LAB — CBC WITH DIFFERENTIAL/PLATELET
BASOS ABS: 0.1 10*3/uL (ref 0.0–0.1)
Basophils Relative: 1 % (ref 0–1)
Eosinophils Absolute: 0.8 10*3/uL — ABNORMAL HIGH (ref 0.0–0.7)
Eosinophils Relative: 5 % (ref 0–5)
HEMATOCRIT: 40.6 % (ref 39.0–52.0)
Hemoglobin: 14.2 g/dL (ref 13.0–17.0)
Lymphocytes Relative: 23 % (ref 12–46)
Lymphs Abs: 3.3 10*3/uL (ref 0.7–4.0)
MCH: 32.5 pg (ref 26.0–34.0)
MCHC: 35 g/dL (ref 30.0–36.0)
MCV: 92.9 fL (ref 78.0–100.0)
MONO ABS: 1.9 10*3/uL — AB (ref 0.1–1.0)
Monocytes Relative: 13 % — ABNORMAL HIGH (ref 3–12)
NEUTROS ABS: 8.3 10*3/uL — AB (ref 1.7–7.7)
Neutrophils Relative %: 58 % (ref 43–77)
PLATELETS: 223 10*3/uL (ref 150–400)
RBC: 4.37 MIL/uL (ref 4.22–5.81)
RDW: 12.6 % (ref 11.5–15.5)
WBC: 14.3 10*3/uL — AB (ref 4.0–10.5)

## 2014-10-18 LAB — BASIC METABOLIC PANEL
Anion gap: 2 — ABNORMAL LOW (ref 5–15)
BUN: 20 mg/dL (ref 6–23)
CHLORIDE: 105 mmol/L (ref 96–112)
CO2: 27 mmol/L (ref 19–32)
Calcium: 9 mg/dL (ref 8.4–10.5)
Creatinine, Ser: 1.14 mg/dL (ref 0.50–1.35)
GFR calc non Af Amer: 70 mL/min — ABNORMAL LOW (ref >90)
GFR, EST AFRICAN AMERICAN: 81 mL/min — AB (ref >90)
Glucose, Bld: 124 mg/dL — ABNORMAL HIGH (ref 70–99)
POTASSIUM: 3.5 mmol/L (ref 3.5–5.1)
Sodium: 134 mmol/L — ABNORMAL LOW (ref 135–145)

## 2014-10-18 LAB — TROPONIN I

## 2014-10-18 LAB — BRAIN NATRIURETIC PEPTIDE: B Natriuretic Peptide: 15 pg/mL (ref 0.0–100.0)

## 2014-10-18 MED ORDER — NITROGLYCERIN 0.4 MG SL SUBL
SUBLINGUAL_TABLET | SUBLINGUAL | Status: AC
  Administered 2014-10-18: 0.4 mg
  Filled 2014-10-18: qty 3

## 2014-10-18 MED ORDER — NITROGLYCERIN 2 % TD OINT
1.0000 [in_us] | TOPICAL_OINTMENT | Freq: Once | TRANSDERMAL | Status: AC
  Administered 2014-10-18: 1 [in_us] via TOPICAL
  Filled 2014-10-18: qty 1

## 2014-10-18 MED ORDER — LORAZEPAM 2 MG/ML IJ SOLN
1.0000 mg | Freq: Once | INTRAMUSCULAR | Status: AC
  Administered 2014-10-18: 1 mg via INTRAVENOUS
  Filled 2014-10-18: qty 1

## 2014-10-18 MED ORDER — NITROGLYCERIN 0.4 MG SL SUBL
SUBLINGUAL_TABLET | SUBLINGUAL | Status: AC
Start: 2014-10-18 — End: 2014-10-18
  Administered 2014-10-18: 0.4 mg
  Filled 2014-10-18: qty 1

## 2014-10-18 MED ORDER — MORPHINE SULFATE 4 MG/ML IJ SOLN
4.0000 mg | Freq: Once | INTRAMUSCULAR | Status: AC
  Administered 2014-10-18: 4 mg via INTRAVENOUS
  Filled 2014-10-18: qty 1

## 2014-10-18 NOTE — ED Provider Notes (Signed)
CSN: [id number]     Arrival date & time 10/18/14  2100 History  This chart was scribed for Donnetta Hutching, MD by Abel Presto, ED Scribe. This patient was seen in room APA10/APA10 and the patient's care was started at 9:34 PM.    Chief Complaint  Patient presents with  . Chest Pain     Patient is a 59 y.o. male presenting with chest pain. The history is provided by the patient.  Chest Pain  HPI Comments: [name] is a 59 y.o. male brought in by ambulance, who presents to the Emergency Department complaining of intermittent left chest pain with onset 3 days ago worsening tonight. Pt notes diaphoresis, nausea, and SOB. Pt notes pain radiates to left neck and into left shoulder with sharp pain down left arm. Pt has taken 3 NTG daily and 4 baby ASA PTA. Pt was seen at [facility] 5 weeks ago. Pt denies catheterization at that time. Pt with FHx of MI and DVT. Pt h/o COPD, HTN, obesity, CAD, and GERD. Pt notes recent stressors, wife told today her CA is incurable.   Past Medical History  Diagnosis Date  . COPD (chronic obstructive pulmonary disease)   . Essential hypertension, benign   . Chronic back pain   . Anxiety disorder   . Hyperlipidemia   . Depression   . Obesity   . History of alcohol abuse   . History of cocaine abuse   . Pneumonia   . GERD (gastroesophageal reflux disease)   . Headache(784.0)   . Arthritis   . CAD (coronary artery disease)     a. ~ 2000 cath at [facility];  b. 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   Past Surgical History  Procedure Laterality Date  . Hip surgery      rt  . Cholecystectomy    . Back surgery    . Nasal/sinus endoscopy    . Total hip arthroplasty  05/08/2012    Procedure: TOTAL HIP ARTHROPLASTY;  Surgeon: Nestor Lewandowsky, MD;  Location: [facility] OR;  Service: Orthopedics;  Laterality: Left;  . Joint  replacement    . Left heart catheterization with coronary angiogram N/A 09/12/2013    Procedure: LEFT HEART CATHETERIZATION WITH CORONARY ANGIOGRAM;  Surgeon: Kathleene Hazel, MD;  Location: [facility] CATH LAB;  Service: Cardiovascular;  Laterality: N/A;  . Total hip arthroplasty  2014 per pt   Family History  Problem Relation Age of Onset  . Heart attack Mother   . Heart attack Brother   . Heart attack Sister    History  Substance Use Topics  . Smoking status: Current Every Day Smoker -- 0.25 packs/day for 39 years    Types: Cigarettes  . Smokeless tobacco: Never Used  . Alcohol Use: No     Comment: denies    Review of Systems  Cardiovascular: Positive for chest pain.   A complete 10 system review of systems was obtained and all systems are negative except as noted in the HPI and PMH.     Allergies  Ativan; Dilaudid; and Haloperidol decanoate  Home Medications   Prior to Admission medications   Medication Sig Start Date End Date Taking? Authorizing Provider  ALPRAZolam Prudy Feeler) 1 MG tablet Take 1 tablet (1 mg total) by mouth 3 (three) times daily as needed for  anxiety. For anxiety 05/15/12   Kathlen Mody, MD  amLODipine (NORVASC) 5 MG tablet Take 5 mg by mouth daily. 08/06/14   Historical Provider, MD  aspirin 81 MG tablet Take 1 tablet (81 mg total) by mouth daily. 05/31/12   Kathlen Mody, MD  atorvastatin (LIPITOR) 10 MG tablet Take 1 tablet (10 mg total) by mouth daily. 10/14/14   Jonelle Sidle, MD  budesonide-formoterol [facility]) 160-4.5 MCG/ACT inhaler Inhale 2 puffs into the lungs 2 (two) times daily.    Historical Provider, MD  citalopram (CELEXA) 40 MG tablet Take 40 mg by mouth daily.      Historical Provider, MD  doxepin (SINEQUAN) 25 MG capsule Take 25 mg by mouth at bedtime.  08/06/14   Historical Provider, MD  furosemide (LASIX) 40 MG tablet Take 1 tablet (40 mg total) by mouth daily. 12/06/13   Catarina Hartshorn, MD  hydrochlorothiazide (HYDRODIURIL) 25 MG tablet Take  25 mg by mouth daily. 08/06/14   Historical Provider, MD  ipratropium-albuterol (DUONEB) 0.5-2.5 (3) MG/3ML SOLN Take 3 mLs by nebulization every 6 (six) hours as needed (shortness of breath/ wheezing.).     Historical Provider, MD  isosorbide mononitrate (IMDUR) 30 MG 24 hr tablet Take 1 tablet (30 mg total) by mouth 2 (two) times daily. Patient taking differently: Take 30 mg by mouth daily.  08/12/14   Nishant Dhungel, MD  levofloxacin (LEVAQUIN) 750 MG tablet Take 1 tablet (750 mg total) by mouth daily. Patient not taking: Reported on 09/18/2014 08/12/14   Nishant Dhungel, MD  meloxicam (MOBIC) 15 MG tablet Take 15 mg by mouth daily.  08/13/13   Historical Provider, MD  nicotine (NICODERM CQ) 21 mg/24hr patch Place 1 patch (21 mg total) onto the skin daily. Patient not taking: Reported on 09/18/2014 08/12/14   Nishant Dhungel, MD  nitroGLYCERIN (NITROSTAT) 0.4 MG SL tablet Place 0.4 mg under the tongue every 5 (five) minutes as needed. For chest pain    Historical Provider, MD  omeprazole (PRILOSEC) 20 MG capsule Take 20 mg by mouth daily.    Historical Provider, MD  potassium chloride SA (K-DUR,KLOR-CON) 20 MEQ tablet Take 20 mEq by mouth 2 (two) times daily.  08/13/13   Historical Provider, MD  predniSONE (DELTASONE) 20 MG tablet Take 3 tablets (60 mg total) by mouth 2 (two) times daily with a meal. Take 3 tablets two times daily x 2 days; then 2 tablets (40mg ) two times daily x 2 days, then 1 tablet (20mg ) two times a day x 2 days Patient not taking: Reported on 09/18/2014 08/12/14   Nishant Dhungel, MD  PROAIR HFA 108 (90 BASE) MCG/ACT inhaler Inhale 2 puffs into the lungs daily as needed for wheezing or shortness of breath.  06/20/13   Historical Provider, MD  ranolazine (RANEXA) 500 MG 12 hr tablet Take 1 tablet (500 mg total) by mouth 2 (two) times daily. 08/12/14   Nishant Dhungel, MD  traZODone (DESYREL) 100 MG tablet Take 100 mg by mouth at bedtime.     Historical Provider, MD   BP 138/58  mmHg  Pulse 71  Temp(Src) 98.7 F (37.1 C) (Oral)  Resp 15  Wt 357 lb (161.934 kg)  SpO2 100% Physical Exam  Constitutional: He is oriented to person, place, and time. He appears well-developed and well-nourished.  obese  HENT:  Head: Normocephalic and atraumatic.  Eyes: Conjunctivae and EOM are normal. Pupils are equal, round, and reactive to light.  Neck: Normal range of motion. Neck supple.  Cardiovascular:  Normal rate and regular rhythm.   Pulmonary/Chest: Effort normal and breath sounds normal.  Abdominal: Soft. Bowel sounds are normal.  Musculoskeletal: Normal range of motion.  Neurological: He is alert and oriented to person, place, and time.  Skin: Skin is warm and dry.  Psychiatric: He has a normal mood and affect. His behavior is normal.  Nursing note and vitals reviewed.   ED Course  Procedures (including critical care time) DIAGNOSTIC STUDIES: Oxygen Saturation is 99% on room air, normal by my interpretation.    COORDINATION OF CARE: 9:44 PM Discussed treatment plan with patient at beside, the patient agrees with the plan and has no further questions at this time.   Labs Review Labs Reviewed  BASIC METABOLIC PANEL - Abnormal; Notable for the following:    Sodium 134 (*)    Glucose, Bld 124 (*)    GFR calc non Af Amer 70 (*)    GFR calc Af Amer 81 (*)    Anion gap 2 (*)    All other components within normal limits  CBC WITH DIFFERENTIAL/PLATELET - Abnormal; Notable for the following:    WBC 14.3 (*)    Neutro Abs 8.3 (*)    Monocytes Relative 13 (*)    Monocytes Absolute 1.9 (*)    Eosinophils Absolute 0.8 (*)    All other components within normal limits  TROPONIN I  BRAIN NATRIURETIC PEPTIDE    Imaging Review Dg Chest Port 1 View  10/18/2014   CLINICAL DATA:  Chest pain.  EXAM: PORTABLE CHEST - 1 VIEW  COMPARISON:  09/18/2014  FINDINGS: Heart is normal size. No confluent airspace opacities or effusions. No acute bony abnormality.  IMPRESSION: No  active disease.   Electronically Signed   By: Charlett Nose M.D.   On: 10/18/2014 21:43     EKG Interpretation   Date/Time:  Friday October 18 2014 21:08:28 EST Ventricular Rate:  95 PR Interval:    QRS Duration: 104 QT Interval:  498 QTC Calculation: 626 R Axis:   -6 Text Interpretation:  Atrial fibrillation Ventricular premature complex  Low voltage, precordial leads Minimal ST depression, anterolateral leads  ST elevation, consider inferior injury Prolonged QT interval Baseline  wander in lead(s) V2 V4 Confirmed by Adriana Simas  MD, Anastacio Bua ([id number]) on 10/18/2014  9:12:05 PM Also confirmed by Adriana Simas  MD, Chevelle Durr ([id number])  on 10/18/2014 9:52:57  PM      Results for orders placed or performed during the hospital encounter of 10/18/14  Basic metabolic panel  Result Value Ref Range   Sodium 134 (L) 135 - 145 mmol/L   Potassium 3.5 3.5 - 5.1 mmol/L   Chloride 105 96 - 112 mmol/L   CO2 27 19 - 32 mmol/L   Glucose, Bld 124 (H) 70 - 99 mg/dL   BUN 20 6 - 23 mg/dL   Creatinine, Ser 0.86 0.50 - 1.35 mg/dL   Calcium 9.0 8.4 - 57.8 mg/dL   GFR calc non Af Amer 70 (L) >90 mL/min   GFR calc Af Amer 81 (L) >90 mL/min   Anion gap 2 (L) 5 - 15  CBC with Differential/Platelet  Result Value Ref Range   WBC 14.3 (H) 4.0 - 10.5 K/uL   RBC 4.37 4.22 - 5.81 MIL/uL   Hemoglobin 14.2 13.0 - 17.0 g/dL   HCT 46.9 62.9 - 52.8 %   MCV 92.9 78.0 - 100.0 fL   MCH 32.5 26.0 - 34.0 pg   MCHC 35.0 30.0 - 36.0 g/dL  RDW 12.6 11.5 - 15.5 %   Platelets 223 150 - 400 K/uL   Neutrophils Relative % 58 43 - 77 %   Neutro Abs 8.3 (H) 1.7 - 7.7 K/uL   Lymphocytes Relative 23 12 - 46 %   Lymphs Abs 3.3 0.7 - 4.0 K/uL   Monocytes Relative 13 (H) 3 - 12 %   Monocytes Absolute 1.9 (H) 0.1 - 1.0 K/uL   Eosinophils Relative 5 0 - 5 %   Eosinophils Absolute 0.8 (H) 0.0 - 0.7 K/uL   Basophils Relative 1 0 - 1 %   Basophils Absolute 0.1 0.0 - 0.1 K/uL  Troponin I  Result Value Ref Range   Troponin I <0.03 <0.031 ng/mL   Brain natriuretic peptide  Result Value Ref Range   B Natriuretic Peptide 15.0 0.0 - 100.0 pg/mL   Dg Chest Port 1 View  10/18/2014   CLINICAL DATA:  Chest pain.  EXAM: PORTABLE CHEST - 1 VIEW  COMPARISON:  09/18/2014  FINDINGS: Heart is normal size. No confluent airspace opacities or effusions. No acute bony abnormality.  IMPRESSION: No active disease.   Electronically Signed   By: Charlett Nose M.D.   On: 10/18/2014 21:43      MDM   Final diagnoses:  Chest pain  Patient with a host of cardiac risk factors presents with chest pain for 3 days, worse this evening with associated dyspnea and diaphoresis. Initial EKG and troponin showed no acute changes. Patient is hemodynamically  stable. Discussed with Dr. Antoine Poche.  Transfer to [facility]. Patient self-administered aspirin at home. Nitroglycerin paste applied in ED  I personally performed the services described in this documentation, which was scribed in my presence. The recorded information has been reviewed and is accurate.     Donnetta Hutching, MD 10/18/14 2240

## 2014-10-18 NOTE — ED Notes (Signed)
Pt to department via EMS.  Pt reporting chest pain began 3 days ago.  Pt has been taking nitro and pain has responded.  Pt reporting pain radiates to left shoulder, left arm and into neck.  Reporting some associated nausea and SOB, worsening tonight.  Pt currently being treated for URI as well.  Pt reports his cardiologist is to schedule a stress test.

## 2014-10-19 ENCOUNTER — Encounter (HOSPITAL_COMMUNITY): Payer: Self-pay | Admitting: Internal Medicine

## 2014-10-19 DIAGNOSIS — Z720 Tobacco use: Secondary | ICD-10-CM

## 2014-10-19 DIAGNOSIS — R079 Chest pain, unspecified: Secondary | ICD-10-CM

## 2014-10-19 DIAGNOSIS — M542 Cervicalgia: Secondary | ICD-10-CM | POA: Insufficient documentation

## 2014-10-19 DIAGNOSIS — J441 Chronic obstructive pulmonary disease with (acute) exacerbation: Secondary | ICD-10-CM

## 2014-10-19 DIAGNOSIS — I10 Essential (primary) hypertension: Secondary | ICD-10-CM | POA: Diagnosis present

## 2014-10-19 LAB — CBC WITH DIFFERENTIAL/PLATELET
BASOS ABS: 0.1 10*3/uL (ref 0.0–0.1)
Basophils Relative: 1 % (ref 0–1)
EOS ABS: 0.7 10*3/uL (ref 0.0–0.7)
EOS PCT: 6 % — AB (ref 0–5)
HEMATOCRIT: 38.3 % — AB (ref 39.0–52.0)
Hemoglobin: 13.3 g/dL (ref 13.0–17.0)
LYMPHS ABS: 3.4 10*3/uL (ref 0.7–4.0)
Lymphocytes Relative: 27 % (ref 12–46)
MCH: 32.4 pg (ref 26.0–34.0)
MCHC: 34.7 g/dL (ref 30.0–36.0)
MCV: 93.2 fL (ref 78.0–100.0)
MONOS PCT: 12 % (ref 3–12)
Monocytes Absolute: 1.4 10*3/uL — ABNORMAL HIGH (ref 0.1–1.0)
NEUTROS PCT: 54 % (ref 43–77)
Neutro Abs: 6.7 10*3/uL (ref 1.7–7.7)
PLATELETS: 210 10*3/uL (ref 150–400)
RBC: 4.11 MIL/uL — AB (ref 4.22–5.81)
RDW: 12.7 % (ref 11.5–15.5)
WBC: 12.4 10*3/uL — AB (ref 4.0–10.5)

## 2014-10-19 LAB — COMPREHENSIVE METABOLIC PANEL
ALT: 22 U/L (ref 0–53)
ANION GAP: 5 (ref 5–15)
AST: 19 U/L (ref 0–37)
Albumin: 3.9 g/dL (ref 3.5–5.2)
Alkaline Phosphatase: 51 U/L (ref 39–117)
BILIRUBIN TOTAL: 0.5 mg/dL (ref 0.3–1.2)
BUN: 17 mg/dL (ref 6–23)
CHLORIDE: 104 mmol/L (ref 96–112)
CO2: 21 mmol/L (ref 19–32)
Calcium: 8.9 mg/dL (ref 8.4–10.5)
Creatinine, Ser: 0.98 mg/dL (ref 0.50–1.35)
GFR calc Af Amer: >90 mL/min (ref >90)
GFR calc non Af Amer: >90 mL/min (ref >90)
Glucose, Bld: 118 mg/dL — ABNORMAL HIGH (ref 70–99)
Potassium: 3.4 mmol/L — ABNORMAL LOW (ref 3.5–5.1)
Sodium: 130 mmol/L — ABNORMAL LOW (ref 135–145)
Total Protein: 7 g/dL (ref 6.0–8.3)

## 2014-10-19 LAB — TROPONIN I
Troponin I: <0.03 ng/mL (ref <0.031)
Troponin I: 0.03 ng/mL (ref <0.031)
Troponin I: <0.03 ng/mL (ref <0.031)
Troponin I: <0.03 ng/mL (ref <0.031)

## 2014-10-19 LAB — CREATININE, SERUM

## 2014-10-19 LAB — D-DIMER, QUANTITATIVE (NOT AT ARMC)

## 2014-10-19 MED ORDER — ATORVASTATIN CALCIUM 10 MG PO TABS
10.0000 mg | ORAL_TABLET | Freq: Every day | ORAL | Status: DC
  Administered 2014-10-19 – 2014-10-20 (×2): 10 mg via ORAL
  Filled 2014-10-19 (×2): qty 1

## 2014-10-19 MED ORDER — OXYCODONE HCL 5 MG PO TABS
5.0000 mg | ORAL_TABLET | Freq: Once | ORAL | Status: DC
  Filled 2014-10-19: qty 1

## 2014-10-19 MED ORDER — ASPIRIN EC 81 MG PO TBEC
81.0000 mg | DELAYED_RELEASE_TABLET | Freq: Every day | ORAL | Status: DC
  Administered 2014-10-19 – 2014-10-20 (×2): 81 mg via ORAL
  Filled 2014-10-19 (×2): qty 1

## 2014-10-19 MED ORDER — POTASSIUM CHLORIDE CRYS ER 20 MEQ PO TBCR
40.0000 meq | EXTENDED_RELEASE_TABLET | Freq: Once | ORAL | Status: AC
  Administered 2014-10-19: 40 meq via ORAL
  Filled 2014-10-19: qty 2

## 2014-10-19 MED ORDER — BUDESONIDE 0.25 MG/2ML IN SUSP
0.2500 mg | Freq: Two times a day (BID) | RESPIRATORY_TRACT | Status: DC
  Administered 2014-10-19 – 2014-10-20 (×3): 0.25 mg via RESPIRATORY_TRACT
  Filled 2014-10-19 (×5): qty 2

## 2014-10-19 MED ORDER — CITALOPRAM HYDROBROMIDE 20 MG PO TABS
40.0000 mg | ORAL_TABLET | Freq: Every day | ORAL | Status: DC
Start: 2014-10-19 — End: 2014-10-20
  Administered 2014-10-19 – 2014-10-20 (×2): 40 mg via ORAL
  Filled 2014-10-19 (×2): qty 2

## 2014-10-19 MED ORDER — FUROSEMIDE 40 MG PO TABS
40.0000 mg | ORAL_TABLET | Freq: Every day | ORAL | Status: DC
  Administered 2014-10-19 – 2014-10-20 (×2): 40 mg via ORAL
  Filled 2014-10-19 (×2): qty 1

## 2014-10-19 MED ORDER — MORPHINE SULFATE 2 MG/ML IJ SOLN
2.0000 mg | INTRAMUSCULAR | Status: DC | PRN
  Administered 2014-10-19 – 2014-10-20 (×15): 2 mg via INTRAVENOUS
  Filled 2014-10-19 (×15): qty 1

## 2014-10-19 MED ORDER — ONDANSETRON HCL 4 MG/2ML IJ SOLN: 4.0000 mg | Freq: Four times a day (QID) | INTRAMUSCULAR | Status: DC | PRN

## 2014-10-19 MED ORDER — DOXEPIN HCL 25 MG PO CAPS
25.0000 mg | ORAL_CAPSULE | Freq: Every day | ORAL | Status: DC
  Administered 2014-10-19: 25 mg via ORAL
  Filled 2014-10-19: qty 1

## 2014-10-19 MED ORDER — ALPRAZOLAM 1 MG PO TABS
1.0000 mg | ORAL_TABLET | Freq: Three times a day (TID) | ORAL | Status: DC | PRN
  Administered 2014-10-19 – 2014-10-20 (×4): 1 mg via ORAL
  Filled 2014-10-19 (×4): qty 1

## 2014-10-19 MED ORDER — ENOXAPARIN SODIUM 40 MG/0.4ML ~~LOC~~ SOLN
40.0000 mg | SUBCUTANEOUS | Status: DC
  Administered 2014-10-19 – 2014-10-20 (×2): 40 mg via SUBCUTANEOUS
  Filled 2014-10-19 (×2): qty 0.4

## 2014-10-19 MED ORDER — ACETAMINOPHEN 325 MG PO TABS: 650.0000 mg | ORAL_TABLET | ORAL | Status: DC | PRN

## 2014-10-19 MED ORDER — POTASSIUM CHLORIDE CRYS ER 20 MEQ PO TBCR
20.0000 meq | EXTENDED_RELEASE_TABLET | Freq: Two times a day (BID) | ORAL | Status: DC
  Administered 2014-10-19 – 2014-10-20 (×3): 20 meq via ORAL
  Filled 2014-10-19 (×4): qty 1

## 2014-10-19 MED ORDER — OXYCODONE HCL 5 MG PO TABS
5.0000 mg | ORAL_TABLET | Freq: Once | ORAL | Status: AC
  Administered 2014-10-19: 5 mg via ORAL

## 2014-10-19 MED ORDER — IPRATROPIUM-ALBUTEROL 0.5-2.5 (3) MG/3ML IN SOLN
3.0000 mL | RESPIRATORY_TRACT | Status: DC
  Administered 2014-10-20 (×4): 3 mL via RESPIRATORY_TRACT
  Filled 2014-10-19 (×4): qty 3

## 2014-10-19 MED ORDER — TRAZODONE HCL 50 MG PO TABS
100.0000 mg | ORAL_TABLET | Freq: Every day | ORAL | Status: DC
  Administered 2014-10-19: 100 mg via ORAL
  Filled 2014-10-19: qty 2

## 2014-10-19 MED ORDER — ASPIRIN EC 325 MG PO TBEC: 325.0000 mg | DELAYED_RELEASE_TABLET | Freq: Every day | ORAL | Status: DC

## 2014-10-19 MED ORDER — PANTOPRAZOLE SODIUM 40 MG PO TBEC
40.0000 mg | DELAYED_RELEASE_TABLET | Freq: Every day | ORAL | Status: DC
  Administered 2014-10-19 – 2014-10-20 (×2): 40 mg via ORAL
  Filled 2014-10-19 (×2): qty 1

## 2014-10-19 MED ORDER — ISOSORBIDE MONONITRATE ER 60 MG PO TB24
30.0000 mg | ORAL_TABLET | Freq: Every day | ORAL | Status: DC
  Administered 2014-10-19 – 2014-10-20 (×2): 30 mg via ORAL
  Filled 2014-10-19 (×2): qty 1

## 2014-10-19 MED ORDER — ALBUTEROL SULFATE (2.5 MG/3ML) 0.083% IN NEBU: 2.5000 mg | INHALATION_SOLUTION | RESPIRATORY_TRACT | Status: DC | PRN

## 2014-10-19 MED ORDER — NITROGLYCERIN 0.4 MG SL SUBL: 0.4000 mg | SUBLINGUAL_TABLET | SUBLINGUAL | Status: DC | PRN

## 2014-10-19 MED ORDER — IPRATROPIUM-ALBUTEROL 0.5-2.5 (3) MG/3ML IN SOLN
3.0000 mL | RESPIRATORY_TRACT | Status: DC
  Administered 2014-10-19 (×5): 3 mL via RESPIRATORY_TRACT
  Filled 2014-10-19 (×5): qty 3

## 2014-10-19 MED ORDER — OXYCODONE-ACETAMINOPHEN 5-325 MG PO TABS
1.0000 | ORAL_TABLET | Freq: Once | ORAL | Status: AC
  Administered 2014-10-19: 1 via ORAL
  Filled 2014-10-19: qty 1

## 2014-10-19 MED ORDER — RANOLAZINE ER 500 MG PO TB12
500.0000 mg | ORAL_TABLET | Freq: Two times a day (BID) | ORAL | Status: DC
  Administered 2014-10-19 – 2014-10-20 (×3): 500 mg via ORAL
  Filled 2014-10-19 (×3): qty 1

## 2014-10-19 MED ORDER — OXYCODONE-ACETAMINOPHEN 5-325 MG PO TABS
1.0000 | ORAL_TABLET | Freq: Three times a day (TID) | ORAL | Status: DC
  Administered 2014-10-19 – 2014-10-20 (×3): 1 via ORAL
  Filled 2014-10-19 (×3): qty 1

## 2014-10-19 MED ORDER — AMLODIPINE BESYLATE 5 MG PO TABS
5.0000 mg | ORAL_TABLET | Freq: Every day | ORAL | Status: DC
  Administered 2014-10-19 – 2014-10-20 (×2): 5 mg via ORAL
  Filled 2014-10-19 (×2): qty 1

## 2014-10-19 MED ORDER — HYDROCHLOROTHIAZIDE 25 MG PO TABS
25.0000 mg | ORAL_TABLET | Freq: Every day | ORAL | Status: DC
  Administered 2014-10-19 – 2014-10-20 (×2): 25 mg via ORAL
  Filled 2014-10-19 (×2): qty 1

## 2014-10-19 MED ORDER — OXYCODONE-ACETAMINOPHEN 5-325 MG PO TABS: 2.0000 | ORAL_TABLET | Freq: Once | ORAL | Status: DC

## 2014-10-19 NOTE — Progress Notes (Signed)
Removed nitro patch on patient's right upper chest. It has been on for 24 hours.

## 2014-10-19 NOTE — ED Notes (Signed)
Pt moved into a regular hospital bed in room 10 of this ED .

## 2014-10-19 NOTE — ED Notes (Signed)
Pt requesting xanax. Cardiologist contacted by [facility] Annice Pih). States he will not write admission orders since the patient is still here @ [facility]. Suggest either boarding in ED or admission to [facility] pending bed avail @ [facility]

## 2014-10-19 NOTE — ED Notes (Signed)
Dr. Lynelle Doctor with patient

## 2014-10-19 NOTE — Progress Notes (Signed)
INITIAL NUTRITION ASSESSMENT  DOCUMENTATION CODES Per approved criteria  -Morbid Obesity   INTERVENTION: - Recommend MVI  - Recommend Lipid Panel  To compare with one from 09/14/13  - Assess need for dietary education  NUTRITION DIAGNOSIS: Unintentional weight loss related to an unknown etiology as evidenced by loss of 13% bw in 2 months.   Goal: Pt to meet >/= 90% of their estimated nutrition needs   Monitor:  Oral intake, Labs, weight, procedure/tests  Reason for Assessment: MST of 2  59 y.o. male  Admitting Dx: Chest pain  ASSESSMENT: [name] is a 59 y.o. male with history of nonobstructive CAD per cardiac cath last January 2015, COPD, ongoing tobacco abuse, hyperlipidemia, diastolic dysfunction per 2-D echo in December 2015 presents to the ER because of persistent chest pain.  Height: Ht Readings from Last 1 Encounters:  10/19/14  (1.905 m)    Weight: Wt Readings from Last 1 Encounters:  10/19/14 323 lb (146.512 kg)    Ideal Body Weight: 196 lbs 89 kg  % Ideal Body Weight: 165%  Wt Readings from Last 10 Encounters:  10/19/14 323 lb (146.512 kg)  09/19/14 338 lb 4.8 oz (153.452 kg)  08/12/14 356 lb 12.8 oz (161.843 kg)  12/07/13 349 lb 6.9 oz (158.5 kg)  09/21/13 347 lb (157.398 kg)  05/22/12 327 lb (148.326 kg)  05/15/12 326 lb 4.5 oz (148 kg)  02/18/12 322 lb (146.058 kg)  02/06/12 327 lb (148.326 kg)  11/29/11 312 lb (141.522 kg)    Usual Body Weight: appears to be 345-355  % Usual Body Weight: 94%  BMI:  Body mass index is 40.37 kg/(m^2).  Estimated Nutritional Needs: Kcal: 1450-1750 (10-12 kcal/kg) Protein: 98-115 (1.1-1.3 g/kg IBW) Fluid: 1.7 liters  Skin: WDL  Diet Order: Diet Heart  EDUCATION NEEDS: -Education needs addressed   Intake/Output Summary (Last 24 hours) at 10/19/14 1134 Last data filed at 10/19/14 0700  Gross per 24 hour  Intake    240 ml  Output    300 ml  Net    -60 ml    Last BM:  2/26  Labs:   Recent Labs Lab 10/18/14 2113 10/19/14 0345  NA 134* 130*  K 3.5 3.4*  CL 105 104  CO2 27 21  BUN 20 17  CREATININE 1.14 0.98  CALCIUM 9.0 8.9  GLUCOSE 124* 118*    CBG (last 3)  No results for input(s): GLUCAP in the last 72 hours.  Scheduled Meds: . amLODipine  5 mg Oral Daily  . aspirin EC  81 mg Oral Daily  . atorvastatin  10 mg Oral Daily  . budesonide (PULMICORT) nebulizer solution  0.25 mg Nebulization BID  . citalopram  40 mg Oral Daily  . doxepin  25 mg Oral QHS  . enoxaparin (LOVENOX) injection  40 mg Subcutaneous Q24H  . furosemide  40 mg Oral Daily  . hydrochlorothiazide  25 mg Oral Daily  . ipratropium-albuterol  3 mL Nebulization Q4H  . isosorbide mononitrate  30 mg Oral Daily  . pantoprazole  40 mg Oral Daily  . potassium chloride SA  20 mEq Oral BID  . ranolazine  500 mg Oral BID  . traZODone  100 mg Oral QHS    Continuous Infusions:   Past Medical History  Diagnosis Date  . COPD (chronic obstructive pulmonary disease)   . Essential hypertension, benign   . Chronic back pain   . Anxiety disorder   . Hyperlipidemia   . Depression   .  Obesity   . History of alcohol abuse   . History of cocaine abuse   . Pneumonia   . GERD (gastroesophageal reflux disease)   . Headache(784.0)   . Arthritis   . CAD (coronary artery disease)     a. ~ 2000 cath at [facility];  b. 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    Past Surgical History  Procedure Laterality Date  . Hip surgery      rt  . Cholecystectomy    . Back surgery    . Nasal/sinus endoscopy    . Total hip arthroplasty  05/08/2012    Procedure: TOTAL HIP ARTHROPLASTY;  Surgeon: Nestor Lewandowsky, MD;  Location: [facility] OR;  Service: Orthopedics;  Laterality: Left;  . Joint replacement    . Left heart catheterization with coronary angiogram N/A  09/12/2013    Procedure: LEFT HEART CATHETERIZATION WITH CORONARY ANGIOGRAM;  Surgeon: Kathleene Hazel, MD;  Location: [facility] CATH LAB;  Service: Cardiovascular;  Laterality: N/A;  . Total hip arthroplasty  2014 per pt    Christophe Louis RD, LDN Nutrition Pager: [phone] 10/19/2014 11:35 AM

## 2014-10-19 NOTE — Progress Notes (Signed)
Utilization Review completed.  

## 2014-10-19 NOTE — Progress Notes (Signed)
Patient was admitted to the hospital earlier this morning by Dr. Toniann Fail  Patient seen and examined.  He has recurrent neck/left chest pain with radiation into his left arm. He does have a history of nonobstructive coronary disease and has had several admissions in the past 2 months for evaluation of chest pain. He was initially evaluated in the emergency room and plans were to transfer to [facility] for cardiology evaluation. Unfortunately, there does not appear to be any available beds at [facility] at this time. Since the patient has recurrent chest pain, he would need a stepdown bed at [facility]. He has ruled out for a significant improvement of cardiac markers also has ongoing pain. He is already on Imdur and Ranexa. Chest x-ray and BNP are unremarkable. Check d-dimer. We'll continue supportive treatment. I think he will need to be evaluated by cardiology prior to discharge.  [name]

## 2014-10-19 NOTE — ED Notes (Signed)
Continues c/o same 6/10 pain left chest, color good, warm and dry. nsr w/o ectopy. To be admitted here. Awaiting bed placement. Pt aware

## 2014-10-19 NOTE — H&P (Signed)
[facility] History and Physical  [name] FXT:[id number] DOB: Nov 04, 1955 DOA: 10/18/2014  Referring physician: ER physician. PCP: [name], MD   Chief Complaint: Chest pain.  HPI: [name] is a 59 y.o. male with history of nonobstructive CAD per cardiac cath last January 2015, COPD, ongoing tobacco abuse, hyperlipidemia, diastolic dysfunction per 2-D echo in December 2015 presents to the ER because of persistent chest pain. Patient states he has been a persistent chest pain radiating to his neck and left arm over the last 3 days. Pain is sometimes pressure like and sometimes burning in sensation. No exertional symptoms. Patient also has mild shortness of breath with wheezing. The ER chest x-ray did not show anything acute and EKG was unremarkable with initial cardiac markers negative. At this time patient has been admitted to rule out ACS. Patient denies any nausea vomiting abdominal pain diarrhea diaphoresis. On exam patient has bilateral expiratory wheeze and congestion he also has been having mild edema in the lower extremity. Patient was admitted in the early part of February this month but patient left AMA.  Review of Systems: As presented in the history of presenting illness, rest negative.  Past Medical History  Diagnosis Date  . COPD (chronic obstructive pulmonary disease)   . Essential hypertension, benign   . Chronic back pain   . Anxiety disorder   . Hyperlipidemia   . Depression   . Obesity   . History of alcohol abuse   . History of cocaine abuse   . Pneumonia   . GERD (gastroesophageal reflux disease)   . Headache(784.0)   . Arthritis   . CAD (coronary artery disease)     a. ~ 2000 cath at [facility];  b. 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   Past Surgical History  Procedure Laterality Date  . Hip surgery       rt  . Cholecystectomy    . Back surgery    . Nasal/sinus endoscopy    . Total hip arthroplasty  05/08/2012    Procedure: TOTAL HIP ARTHROPLASTY;  Surgeon: Nestor Lewandowsky, MD;  Location: [facility] OR;  Service: Orthopedics;  Laterality: Left;  . Joint replacement    . Left heart catheterization with coronary angiogram N/A 09/12/2013    Procedure: LEFT HEART CATHETERIZATION WITH CORONARY ANGIOGRAM;  Surgeon: Kathleene Hazel, MD;  Location: [facility] CATH LAB;  Service: Cardiovascular;  Laterality: N/A;  . Total hip arthroplasty  2014 per pt   Social History:  reports that he has been smoking Cigarettes.  He has a 9.75 pack-year smoking history. He has never used smokeless tobacco. He reports that he does not drink alcohol or use illicit drugs. Where does patient live home. Can patient participate in ADLs? Yes.  Allergies  Allergen Reactions  . Ativan [Lorazepam] Other (See Comments)    "out of mind"  . Dilaudid [Hydromorphone Hcl] Other (See Comments)    " out of mind"  . Haloperidol Decanoate     Family History:  Family History  Problem Relation Age of Onset  . Heart attack Mother   . Heart attack Brother   . Heart attack Sister       Prior to Admission medications   Medication Sig Start Date End Date Taking? Authorizing Provider  ALPRAZolam Prudy Feeler) 1 MG tablet Take 1 tablet (1  mg total) by mouth 3 (three) times daily as needed for anxiety. For anxiety 05/15/12  Yes Kathlen Mody, MD  amLODipine (NORVASC) 5 MG tablet Take 5 mg by mouth daily. 08/06/14  Yes Historical Provider, MD  atorvastatin (LIPITOR) 10 MG tablet Take 1 tablet (10 mg total) by mouth daily. 10/14/14  Yes Jonelle Sidle, MD  citalopram (CELEXA) 40 MG tablet Take 40 mg by mouth daily.     Yes Historical Provider, MD  doxepin (SINEQUAN) 25 MG capsule Take 25 mg by mouth at bedtime.  08/06/14  Yes Historical Provider, MD  furosemide (LASIX) 40 MG tablet Take 1 tablet (40 mg total) by mouth daily. 12/06/13  Yes Catarina Hartshorn,  MD  hydrochlorothiazide (HYDRODIURIL) 25 MG tablet Take 25 mg by mouth daily. 08/06/14  Yes Historical Provider, MD  ipratropium-albuterol (DUONEB) 0.5-2.5 (3) MG/3ML SOLN Take 3 mLs by nebulization every 6 (six) hours as needed (shortness of breath/ wheezing.).    Yes Historical Provider, MD  isosorbide mononitrate (IMDUR) 30 MG 24 hr tablet Take 1 tablet (30 mg total) by mouth 2 (two) times daily. Patient taking differently: Take 30 mg by mouth daily.  08/12/14  Yes Nishant Dhungel, MD  meloxicam (MOBIC) 15 MG tablet Take 15 mg by mouth daily.  08/13/13  Yes Historical Provider, MD  nitroGLYCERIN (NITROSTAT) 0.4 MG SL tablet Place 0.4 mg under the tongue every 5 (five) minutes as needed. For chest pain   Yes Historical Provider, MD  omeprazole (PRILOSEC) 20 MG capsule Take 20 mg by mouth daily.   Yes Historical Provider, MD  potassium chloride SA (K-DUR,KLOR-CON) 20 MEQ tablet Take 20 mEq by mouth 2 (two) times daily.  08/13/13  Yes Historical Provider, MD  PROAIR HFA 108 (90 BASE) MCG/ACT inhaler Inhale 2 puffs into the lungs daily as needed for wheezing or shortness of breath.  06/20/13  Yes Historical Provider, MD  ranolazine (RANEXA) 500 MG 12 hr tablet Take 1 tablet (500 mg total) by mouth 2 (two) times daily. 08/12/14  Yes Nishant Dhungel, MD  traZODone (DESYREL) 100 MG tablet Take 100 mg by mouth at bedtime.    Yes Historical Provider, MD  aspirin 81 MG tablet Take 1 tablet (81 mg total) by mouth daily. 05/31/12   Kathlen Mody, MD  budesonide-formoterol (SYMBICORT) 160-4.5 MCG/ACT inhaler Inhale 2 puffs into the lungs 2 (two) times daily.    Historical Provider, MD  levofloxacin (LEVAQUIN) 750 MG tablet Take 1 tablet (750 mg total) by mouth daily. Patient not taking: Reported on 09/18/2014 08/12/14   Nishant Dhungel, MD  nicotine (NICODERM CQ) 21 mg/24hr patch Place 1 patch (21 mg total) onto the skin daily. Patient not taking: Reported on 09/18/2014 08/12/14   Nishant Dhungel, MD  predniSONE  (DELTASONE) 20 MG tablet Take 3 tablets (60 mg total) by mouth 2 (two) times daily with a meal. Take 3 tablets two times daily x 2 days; then 2 tablets ( ) two times daily x 2 days, then 1 tablet ( ) two times a day x 2 days Patient not taking: Reported on 09/18/2014 08/12/14   Eddie North, MD    Physical Exam: Filed Vitals:   10/19/14 0000 10/19/14 0045 10/19/14 0144 10/19/14 0305  BP: 116/76 111/74 123/71 122/62  Pulse:   68 65  Temp:    97.5 F (36.4 C)  TempSrc:    Axillary  Resp: Height:     (1.905 m)  Weight:      SpO2:  99% 98%     General: Well built and nourished.  Eyes: Anicteric no pallor.  ENT: No discharge from ears eyes nose and mouth.  Neck: No mass felt. JVD not appreciated.  Cardiovascular: S1 and S2 heard.  Respiratory: Bilateral expiratory wheeze heard no crepitations.  Abdomen: Soft nontender bowel sounds present.  Skin: No rash.  Musculoskeletal: Mild edema in the ankle.  Psychiatric: Appears normal.  Neurologic: Alert awake oriented to time place and person. Moves all extremities.  Labs on Admission:  Basic Metabolic Panel:  Recent Labs Lab 10/18/14 2113  NA 134*  K 3.5  CL 105  CO2 27  GLUCOSE 124*  BUN 20  CREATININE 1.14  CALCIUM 9.0   Liver Function Tests: No results for input(s): AST, ALT, ALKPHOS, BILITOT, PROT, ALBUMIN in the last 168 hours. No results for input(s): LIPASE, AMYLASE in the last 168 hours. No results for input(s): AMMONIA in the last 168 hours. CBC:  Recent Labs Lab 10/18/14 2113  WBC 14.3*  NEUTROABS 8.3*  HGB 14.2  HCT 40.6  MCV 92.9  PLT 223   Cardiac Enzymes:  Recent Labs Lab 10/18/14 2113 10/19/14 0116  TROPONINI <0.03 <0.03    BNP (last 3 results)  Recent Labs  10/18/14 2115  BNP 15.0    ProBNP (last 3 results)  Recent Labs  12/05/13 0146 08/08/14 1755 08/09/14 0314  PROBNP 21.1 27.9 29.7    CBG: No results for input(s): GLUCAP in the last  168 hours.  Radiological Exams on Admission: Dg Chest Port 1 View  10/18/2014   CLINICAL DATA:  Chest pain.  EXAM: PORTABLE CHEST - 1 VIEW  COMPARISON:  09/18/2014  FINDINGS: Heart is normal size. No confluent airspace opacities or effusions. No acute bony abnormality.  IMPRESSION: No active disease.   Electronically Signed   By: Charlett Nose M.D.   On: 10/18/2014 21:43    EKG: Independently reviewed. Normal sinus rhythm with poor R-wave progression. Poor quality EKG.  Assessment/Plan Principal Problem:   Chest pain Active Problems:   Tobacco abuse   COPD exacerbation   Hypertension   1. Chest pain - with history of nonobstructive CAD per cardiac cath in January 2015 at this time we will cycle cardiac markers to rule out ACS. Patient's chest pain is relieved by morphine. Continue Imdur and Ranexa aspirin statins. Patient is not on beta blockers due to history of bradycardia. Patient has been kept nothing by mouth in anticipation of possible cardiac procedure. Consult cardiology in a.m. 2. COPD exacerbation - on exam patient has mild expiratory wheeze bilaterally. Patient has been placed on Pulmicort and nebulizer. May add IV steroids if patient still has persistent wheezes. 3. Hypertension - continue present medications. 4. Tobacco abuse - patient advised to quit tobacco use. 5. Hyperlipidemia - on statins. 6. Diastolic dysfunction per 2-D echo on December 2015 with EF of 60-65% - on Lasix. Closely follow intake and output and daily weights. 7. Anxiety and depression - continue home medications.   DVT Prophylaxis Lovenox.  Code Status: Full code.  Family Communication: None.  Disposition Plan: Admit for observation.    Chenika Nevils N. [facility] Pager [phone].  If 7PM-7AM, please contact night-coverage [email or web address] Password [email or web address] 10/19/2014, 3:10 AM

## 2014-10-19 NOTE — ED Notes (Signed)
[facility] here, patient still c/o chest pain as he has for entire stay. Call placed to the charge nurse for 2w @ [facility],  She states they cannot accept him with active chest pain. Dr. Adriana Simas aware and is changing admission status to step down. No step down beds are currently available. All persons involved (including patient) are aware of this. Pt will board in ED until bed is available

## 2014-10-19 NOTE — ED Provider Notes (Signed)
This chart was scribed for Ward Givens, MD by Abel Presto, ED Scribe. This patient was seen in room APA10/APA10 and the patient's care was started at 12:58 AM.  Pt was left at change of shift waiting for a bed at [facility], however they are holding patients in the ED at [facility] also so a bed is not likely to be available tonight. Nurses request cardiac orders on patient.     HPI Comments: [name] is a 59 y.o. male who presents to the Emergency Department complaining of sharp waxing and waning left sided chest pain and also pain in his left neck, radiating into his left shoulder and arm (pt talks about the neck pain first, had to ask about his chest pain) for the past 3 days. Pt notes pain is intermittant lasts approximately 30 minutes. Pt notes associated SOB. Pt denies aggravating or alleviating factors, although his nurse states his wife was told today her lymphoma is not treatable any more. Pt notes he has taken NTG with relief earlier but not today. Pt's PCP is Dr. Olena Leatherwood. Pt sees cardiologist at [facility].  Review of patient's prior record shows he was admitted in December 2015 to the medicine service with chest pain, hypertension and nonobstructive coronary artery disease. He was consulted by cardiology at that time who felt his chest pain was very atypical. He had normal enzymes and EKG. His echo was normal with left ventricular ejection fraction of 60-65% and no wall motion abnormalities. Had a cardiac cath in January 2015 which showed 30% stenosis in his LAD, 30% proximal, 50% mid stenosis and a 50% ostial stenosis in the circumflex. Right coronary artery had a 40% ostial stenosis and mild vessel irregularity. A 70% stenosis and a distal branch. At that time medical management was recommended.  01:50 Dr Toniann Fail we discussed patient, he is going to admit here to telemetry.   Diagnoses that have been ruled out:  None  Diagnoses that are still under consideration:  None  Final diagnoses:   Chest pain  Neck pain on left side    Plan admission  Devoria Albe, MD, Franz Dell, MD 10/19/14 [phone]

## 2014-10-20 LAB — BASIC METABOLIC PANEL
ANION GAP: 7 (ref 5–15)
BUN: 11 mg/dL (ref 6–23)
CO2: 29 mmol/L (ref 19–32)
Calcium: 9.2 mg/dL (ref 8.4–10.5)
Chloride: 100 mmol/L (ref 96–112)
Creatinine, Ser: 1.05 mg/dL (ref 0.50–1.35)
GFR calc Af Amer: 90 mL/min — ABNORMAL LOW (ref >90)
GFR, EST NON AFRICAN AMERICAN: 78 mL/min — AB (ref >90)
Glucose, Bld: 140 mg/dL — ABNORMAL HIGH (ref 70–99)
POTASSIUM: 3.5 mmol/L (ref 3.5–5.1)
Sodium: 136 mmol/L (ref 135–145)

## 2014-10-20 MED ORDER — CYCLOBENZAPRINE HCL 5 MG PO TABS: 5.0000 mg | ORAL_TABLET | Freq: Three times a day (TID) | ORAL | Status: DC | PRN

## 2014-10-20 NOTE — Discharge Summary (Addendum)
Physician Discharge Summary  [name] ZOX:[id number] DOB: [birth year] DOA: 10/18/2014  PCP: [name], MD  Admit date: 10/18/2014 Discharge date: 10/20/2014  Time spent: 40 minutes  Recommendations for Outpatient Follow-up:  1. Follow-up with primary care physician in 1-2 weeks  Discharge Diagnoses:  Principal Problem:   Chest pain, atypical, likely musculoskeletal Active Problems:   Tobacco abuse   COPD exacerbation   Hypertension   Discharge Condition: Stable  Diet recommendation: Low-salt  Filed Weights   10/18/14 2106 10/19/14 0347  Weight: 161.934 kg (357 lb) 146.512 kg (323 lb)    History of present illness:  This patient was advised the hospital with chest pain. He has a history of nonobstructive coronary artery disease. He presented to the hospital with chest pain, left neck pain and left arm pain. Initial plan was to transfer to [facility], but since there were no beds available at [facility], he was admitted to [facility] Course:  Patient was monitored in the hospital. He did not have any acute EKG changes. He ruled out for ACS with negative cardiac markers. D-dimer was also found to be negative. BNP was negative. Chest x-ray did not show any acute findings. On further examination, patient reports that he has significant pain in his left arm/left chest when he moves his left arm. He also describes significant pain in his neck when he turns his head to the right. When moving his left arm, it reproduces the same pain in his left chest/left neck. Clearly, it appears that his discomfort is musculoskeletal in origin. He is continued on his outpatient pain medications. We'll also prescribe Flexeril and recommend heating pad/massage be applied to this area. He can follow-up with his primary care physician in one week. Follow up with cardiology as needed.  Procedures:    Consultations:    Discharge Exam: Filed Vitals:   10/20/14 1405   BP: 129/61  Pulse: 78  Temp: 98.2 F (36.8 C)  Resp: 20    General: NAD Cardiovascular: S1, S2 RRR Respiratory: CTA B  Discharge Instructions   Discharge Instructions    Diet - low sodium heart healthy    Complete by:  As directed      Increase activity slowly    Complete by:  As directed           Discharge Medication List as of 10/20/2014  5:27 PM    START taking these medications   Details  cyclobenzaprine (FLEXERIL) 5 MG tablet Take 1 tablet (5 mg total) by mouth 3 (three) times daily as needed for muscle spasms., Starting 10/20/2014, Until Discontinued, Normal      CONTINUE these medications which have NOT CHANGED   Details  ALPRAZolam (XANAX) 1 MG tablet Take 1 tablet (1 mg total) by mouth 3 (three) times daily as needed for anxiety. For anxiety, Starting 05/15/2012, Until Discontinued, Print    amLODipine (NORVASC) 5 MG tablet Take 5 mg by mouth daily., Starting 08/06/2014, Until Discontinued, Historical Med    atorvastatin (LIPITOR) 10 MG tablet Take 1 tablet (10 mg total) by mouth daily., Starting 10/14/2014, Until Discontinued, Normal    citalopram (CELEXA) 40 MG tablet Take 40 mg by mouth daily.  , Until Discontinued, Historical Med    doxepin (SINEQUAN) 25 MG capsule Take 25 mg by mouth at bedtime. , Starting 08/06/2014, Until Discontinued, Historical Med    furosemide (LASIX) 40 MG tablet Take 1 tablet (40 mg total) by mouth daily., Starting 12/06/2013, Until  Discontinued, Normal    hydrochlorothiazide (HYDRODIURIL) 25 MG tablet Take 25 mg by mouth daily., Starting 08/06/2014, Until Discontinued, Historical Med    ipratropium-albuterol (DUONEB) 0.5-2.5 (3) MG/3ML SOLN Take 3 mLs by nebulization every 6 (six) hours as needed (shortness of breath/ wheezing.). , Until Discontinued, Historical Med    isosorbide mononitrate (IMDUR) 30 MG 24 hr tablet Take 1 tablet (30 mg total) by mouth 2 (two) times daily., Starting 08/12/2014, Until Discontinued, Print     meloxicam (MOBIC) 15 MG tablet Take 15 mg by mouth daily. , Starting 08/13/2013, Until Discontinued, Historical Med    nitroGLYCERIN (NITROSTAT) 0.4 MG SL tablet Place 0.4 mg under the tongue every 5 (five) minutes as needed. For chest pain, Until Discontinued, Historical Med    omeprazole (PRILOSEC) 20 MG capsule Take 20 mg by mouth daily., Until Discontinued, Historical Med    potassium chloride SA (K-DUR,KLOR-CON) 20 MEQ tablet Take 20 mEq by mouth 2 (two) times daily. , Starting 08/13/2013, Until Discontinued, Historical Med    PROAIR HFA 108 (90 BASE) MCG/ACT inhaler Inhale 2 puffs into the lungs daily as needed for wheezing or shortness of breath. , Starting 06/20/2013, Until Discontinued, Historical Med    ranolazine (RANEXA) 500 MG 12 hr tablet Take 1 tablet (500 mg total) by mouth 2 (two) times daily., Starting 08/12/2014, Until Discontinued, Print    traZODone (DESYREL) 100 MG tablet Take 100 mg by mouth at bedtime. , Until Discontinued, Historical Med    aspirin 81 MG tablet Take 1 tablet (81 mg total) by mouth daily., Starting 05/31/2012, Until Discontinued, Normal    budesonide-formoterol (SYMBICORT) 160-4.5 MCG/ACT inhaler Inhale 2 puffs into the lungs 2 (two) times daily., Until Discontinued, Historical Med    nicotine (NICODERM CQ) 21 mg/24hr patch Place 1 patch (21 mg total) onto the skin daily., Starting 08/12/2014, Until Discontinued, Print      STOP taking these medications     levofloxacin (LEVAQUIN) 750 MG tablet      predniSONE (DELTASONE) 20 MG tablet        Allergies  Allergen Reactions  . Ativan [Lorazepam] Other (See Comments)    "out of mind"  . Dilaudid [Hydromorphone Hcl] Other (See Comments)    " out of mind"  . Haloperidol Decanoate    Follow-up Information    Follow up with [facility], MD.   Specialty:  Internal Medicine   Why:  as scheduled   Contact information:   [address] [place] [address] [phone]        The  results of significant diagnostics from this hospitalization (including imaging, microbiology, ancillary and laboratory) are listed below for reference.    Significant Diagnostic Studies: Dg Chest Port 1 View  10/18/2014   CLINICAL DATA:  Chest pain.  EXAM: PORTABLE CHEST - 1 VIEW  COMPARISON:  09/18/2014  FINDINGS: Heart is normal size. No confluent airspace opacities or effusions. No acute bony abnormality.  IMPRESSION: No active disease.   Electronically Signed   By: Charlett Nose M.D.   On: 10/18/2014 21:43    Microbiology: No results found for this or any previous visit (from the past 240 hour(s)).   Labs: Basic Metabolic Panel:  Recent Labs Lab 10/18/14 2113 10/19/14 0345 10/20/14 0557  NA 134* 130* 136  K 3.5 3.4* 3.5  CL 105 104 100  CO2 GLUCOSE 124* 118* 140*  BUN CREATININE 1.14 0.98 1.05  CALCIUM 9.0 8.9 9.2  Liver Function Tests:  Recent Labs Lab 10/19/14 0345  AST 19  ALT 22  ALKPHOS 51  BILITOT 0.5  PROT 7.0  ALBUMIN 3.9   No results for input(s): LIPASE, AMYLASE in the last 168 hours. No results for input(s): AMMONIA in the last 168 hours. CBC:  Recent Labs Lab 10/18/14 2113 10/19/14 0345  WBC 14.3* 12.4*  NEUTROABS 8.3* 6.7  HGB 14.2 13.3  HCT 40.6 38.3*  MCV 92.9 93.2  PLT 223 210   Cardiac Enzymes:  Recent Labs Lab 10/18/14 2113 10/19/14 0116 10/19/14 0345 10/19/14 0844 10/19/14 1435  TROPONINI <0.03 <0.03 0.03 <0.03 <0.03   BNP: BNP (last 3 results)  Recent Labs  10/18/14 2115  BNP 15.0    ProBNP (last 3 results)  Recent Labs  12/05/13 0146 08/08/14 1755 08/09/14 0314  PROBNP 21.1 27.9 29.7    CBG: No results for input(s): GLUCAP in the last 168 hours.     Signed:  Danyell Shader  [facility] 10/20/2014, 7:17 PM

## 2014-10-20 NOTE — Progress Notes (Signed)
Patient discharged home with wife.  IV removed - WNL.  Reviewed medications and new prescriptions.  Educated on when to call MD/dial 911 for s/s r/t CP.  No questions at this time, stable to DC home.  Assisted off unit with NT assist via WC.

## 2014-10-22 ENCOUNTER — Encounter (HOSPITAL_COMMUNITY): Payer: Self-pay | Admitting: *Deleted

## 2014-10-22 ENCOUNTER — Observation Stay (HOSPITAL_COMMUNITY)
Admission: EM | Admit: 2014-10-22 | Discharge: 2014-10-23 | Disposition: A | Discharge status: Home / Self Care | Payer: Medicare HMO | Attending: Internal Medicine | Admitting: Internal Medicine

## 2014-10-22 ENCOUNTER — Telehealth: Payer: Self-pay | Admitting: *Deleted

## 2014-10-22 DIAGNOSIS — K219 Gastro-esophageal reflux disease without esophagitis: Secondary | ICD-10-CM | POA: Diagnosis not present

## 2014-10-22 DIAGNOSIS — Z8701 Personal history of pneumonia (recurrent): Secondary | ICD-10-CM | POA: Diagnosis not present

## 2014-10-22 DIAGNOSIS — Z7951 Long term (current) use of inhaled steroids: Secondary | ICD-10-CM | POA: Insufficient documentation

## 2014-10-22 DIAGNOSIS — G4730 Sleep apnea, unspecified: Secondary | ICD-10-CM | POA: Diagnosis present

## 2014-10-22 DIAGNOSIS — I2510 Atherosclerotic heart disease of native coronary artery without angina pectoris: Secondary | ICD-10-CM | POA: Insufficient documentation

## 2014-10-22 DIAGNOSIS — M1990 Unspecified osteoarthritis, unspecified site: Secondary | ICD-10-CM | POA: Diagnosis not present

## 2014-10-22 DIAGNOSIS — Z7982 Long term (current) use of aspirin: Secondary | ICD-10-CM | POA: Insufficient documentation

## 2014-10-22 DIAGNOSIS — Z720 Tobacco use: Secondary | ICD-10-CM | POA: Diagnosis not present

## 2014-10-22 DIAGNOSIS — F419 Anxiety disorder, unspecified: Secondary | ICD-10-CM | POA: Insufficient documentation

## 2014-10-22 DIAGNOSIS — I10 Essential (primary) hypertension: Secondary | ICD-10-CM | POA: Diagnosis present

## 2014-10-22 DIAGNOSIS — G8929 Other chronic pain: Secondary | ICD-10-CM | POA: Diagnosis not present

## 2014-10-22 DIAGNOSIS — F329 Major depressive disorder, single episode, unspecified: Secondary | ICD-10-CM | POA: Insufficient documentation

## 2014-10-22 DIAGNOSIS — J449 Chronic obstructive pulmonary disease, unspecified: Secondary | ICD-10-CM | POA: Diagnosis not present

## 2014-10-22 DIAGNOSIS — Z9889 Other specified postprocedural states: Secondary | ICD-10-CM | POA: Diagnosis not present

## 2014-10-22 DIAGNOSIS — R0789 Other chest pain: Secondary | ICD-10-CM

## 2014-10-22 DIAGNOSIS — Z791 Long term (current) use of non-steroidal anti-inflammatories (NSAID): Secondary | ICD-10-CM | POA: Diagnosis not present

## 2014-10-22 DIAGNOSIS — E669 Obesity, unspecified: Secondary | ICD-10-CM | POA: Diagnosis not present

## 2014-10-22 DIAGNOSIS — E785 Hyperlipidemia, unspecified: Secondary | ICD-10-CM | POA: Diagnosis not present

## 2014-10-22 DIAGNOSIS — Z79899 Other long term (current) drug therapy: Secondary | ICD-10-CM | POA: Diagnosis not present

## 2014-10-22 DIAGNOSIS — R079 Chest pain, unspecified: Principal | ICD-10-CM | POA: Diagnosis present

## 2014-10-22 DIAGNOSIS — F411 Generalized anxiety disorder: Secondary | ICD-10-CM | POA: Diagnosis present

## 2014-10-22 LAB — BASIC METABOLIC PANEL
ANION GAP: 11 (ref 5–15)
BUN: 19 mg/dL (ref 6–23)
CO2: 23 mmol/L (ref 19–32)
Calcium: 9.1 mg/dL (ref 8.4–10.5)
Chloride: 102 mmol/L (ref 96–112)
Creatinine, Ser: 1.17 mg/dL (ref 0.50–1.35)
GFR calc Af Amer: 79 mL/min — ABNORMAL LOW (ref >90)
GFR, EST NON AFRICAN AMERICAN: 68 mL/min — AB (ref >90)
Glucose, Bld: 158 mg/dL — ABNORMAL HIGH (ref 70–99)
Potassium: 3.2 mmol/L — ABNORMAL LOW (ref 3.5–5.1)
SODIUM: 136 mmol/L (ref 135–145)

## 2014-10-22 LAB — TROPONIN I: Troponin I: <0.03 ng/mL (ref <0.031)

## 2014-10-22 LAB — CBC
HCT: 39.1 % (ref 39.0–52.0)
Hemoglobin: 13.2 g/dL (ref 13.0–17.0)
MCH: 31.8 pg (ref 26.0–34.0)
MCHC: 33.8 g/dL (ref 30.0–36.0)
MCV: 94.2 fL (ref 78.0–100.0)
Platelets: 236 10*3/uL (ref 150–400)
RBC: 4.15 MIL/uL — AB (ref 4.22–5.81)
RDW: 12.8 % (ref 11.5–15.5)
WBC: 10.7 10*3/uL — AB (ref 4.0–10.5)

## 2014-10-22 LAB — BRAIN NATRIURETIC PEPTIDE: B NATRIURETIC PEPTIDE 5: 23 pg/mL (ref 0.0–100.0)

## 2014-10-22 MED ORDER — ALPRAZOLAM 1 MG PO TABS
1.0000 mg | ORAL_TABLET | Freq: Three times a day (TID) | ORAL | Status: DC | PRN
  Filled 2014-10-22: qty 1

## 2014-10-22 MED ORDER — FUROSEMIDE 40 MG PO TABS: 40.0000 mg | ORAL_TABLET | Freq: Every day | ORAL | Status: DC

## 2014-10-22 MED ORDER — OXYCODONE-ACETAMINOPHEN 10-325 MG PO TABS: 1.0000 | ORAL_TABLET | Freq: Three times a day (TID) | ORAL | Status: DC | PRN

## 2014-10-22 MED ORDER — ATORVASTATIN CALCIUM 10 MG PO TABS: 10.0000 mg | ORAL_TABLET | Freq: Every day | ORAL | Status: DC

## 2014-10-22 MED ORDER — ACETAMINOPHEN 325 MG PO TABS: 650.0000 mg | ORAL_TABLET | ORAL | Status: DC | PRN

## 2014-10-22 MED ORDER — ISOSORBIDE MONONITRATE ER 60 MG PO TB24: 30.0000 mg | ORAL_TABLET | Freq: Two times a day (BID) | ORAL | Status: DC

## 2014-10-22 MED ORDER — POTASSIUM CHLORIDE CRYS ER 20 MEQ PO TBCR: 20.0000 meq | EXTENDED_RELEASE_TABLET | Freq: Two times a day (BID) | ORAL | Status: DC

## 2014-10-22 MED ORDER — DOXEPIN HCL 25 MG PO CAPS
25.0000 mg | ORAL_CAPSULE | Freq: Every day | ORAL | Status: DC
  Administered 2014-10-22: 25 mg via ORAL
  Filled 2014-10-22: qty 1

## 2014-10-22 MED ORDER — ASPIRIN 81 MG PO CHEW: 81.0000 mg | CHEWABLE_TABLET | Freq: Every day | ORAL | Status: DC

## 2014-10-22 MED ORDER — CITALOPRAM HYDROBROMIDE 20 MG PO TABS: 40.0000 mg | ORAL_TABLET | Freq: Every day | ORAL | Status: DC

## 2014-10-22 MED ORDER — MOMETASONE FURO-FORMOTEROL FUM 200-5 MCG/ACT IN AERO
2.0000 | INHALATION_SPRAY | Freq: Two times a day (BID) | RESPIRATORY_TRACT | Status: DC
  Filled 2014-10-22: qty 8.8

## 2014-10-22 MED ORDER — ALBUTEROL SULFATE HFA 108 (90 BASE) MCG/ACT IN AERS: 2.0000 | INHALATION_SPRAY | Freq: Every day | RESPIRATORY_TRACT | Status: DC | PRN

## 2014-10-22 MED ORDER — ONDANSETRON HCL 4 MG/2ML IJ SOLN: 4.0000 mg | Freq: Four times a day (QID) | INTRAMUSCULAR | Status: DC | PRN

## 2014-10-22 MED ORDER — OXYCODONE-ACETAMINOPHEN 5-325 MG PO TABS
1.0000 | ORAL_TABLET | Freq: Three times a day (TID) | ORAL | Status: DC | PRN
  Administered 2014-10-22: 1 via ORAL
  Filled 2014-10-22 (×2): qty 1

## 2014-10-22 MED ORDER — IPRATROPIUM-ALBUTEROL 0.5-2.5 (3) MG/3ML IN SOLN: 3.0000 mL | Freq: Four times a day (QID) | RESPIRATORY_TRACT | Status: DC | PRN

## 2014-10-22 MED ORDER — RANOLAZINE ER 500 MG PO TB12
500.0000 mg | ORAL_TABLET | Freq: Two times a day (BID) | ORAL | Status: DC
  Administered 2014-10-22: 500 mg via ORAL
  Filled 2014-10-22: qty 1

## 2014-10-22 MED ORDER — TRAZODONE HCL 50 MG PO TABS
100.0000 mg | ORAL_TABLET | Freq: Every day | ORAL | Status: DC
  Administered 2014-10-22: 100 mg via ORAL
  Filled 2014-10-22: qty 2

## 2014-10-22 MED ORDER — PANTOPRAZOLE SODIUM 40 MG PO TBEC: 40.0000 mg | DELAYED_RELEASE_TABLET | Freq: Every day | ORAL | Status: DC

## 2014-10-22 MED ORDER — MORPHINE SULFATE 4 MG/ML IJ SOLN
4.0000 mg | Freq: Once | INTRAMUSCULAR | Status: AC
  Administered 2014-10-22: 4 mg via INTRAVENOUS
  Filled 2014-10-22: qty 1

## 2014-10-22 MED ORDER — ONDANSETRON HCL 4 MG/2ML IJ SOLN
4.0000 mg | Freq: Once | INTRAMUSCULAR | Status: AC
  Administered 2014-10-22: 4 mg via INTRAVENOUS
  Filled 2014-10-22: qty 2

## 2014-10-22 MED ORDER — HYDROCHLOROTHIAZIDE 25 MG PO TABS: 25.0000 mg | ORAL_TABLET | Freq: Every day | ORAL | Status: DC

## 2014-10-22 MED ORDER — OXYCODONE HCL 5 MG PO TABS
5.0000 mg | ORAL_TABLET | Freq: Three times a day (TID) | ORAL | Status: DC | PRN
  Administered 2014-10-22: 5 mg via ORAL
  Filled 2014-10-22 (×2): qty 1

## 2014-10-22 MED ORDER — MELOXICAM 7.5 MG PO TABS
15.0000 mg | ORAL_TABLET | Freq: Every day | ORAL | Status: DC
  Filled 2014-10-22: qty 1

## 2014-10-22 MED ORDER — CYCLOBENZAPRINE HCL 10 MG PO TABS: 5.0000 mg | ORAL_TABLET | Freq: Three times a day (TID) | ORAL | Status: DC | PRN

## 2014-10-22 MED ORDER — AMLODIPINE BESYLATE 5 MG PO TABS: 5.0000 mg | ORAL_TABLET | Freq: Every day | ORAL | Status: DC

## 2014-10-22 NOTE — ED Notes (Signed)
Per pt has hasn't had any allergic reaction to Dilaudid and doesn't know why it's on his record, he is only allergic to haldol and ativan.

## 2014-10-22 NOTE — Telephone Encounter (Signed)
Pt c/o chest/neck/arm pain. Pt reports taking 3 nitroglycerin and that is the only medication that has eased pain. Pt was seen in ED on 2/29 and was given flexeril and told to f/u with primary. Pt was also seen in ED on 1/20 where he left AMA. Pt states primary told him to call cardiologist. Advised pt that after taking 3 nitroglycerin and still having CP he needs to report to ED, offered to call ambulance for pt, pt declined and just wanted to come to office. Explained to pt that we would be happy to call ambulance for him and that we would do so if he came into office. Pt declined for [place] to call and stated that he would have his wife drive him. Will forward to provider as Lorain Childes

## 2014-10-22 NOTE — H&P (Signed)
PCP:   [name],[name], MD   Chief Complaint:  Chest pain, neck pain  HPI: 59 yo male with h/o nonobs CAD last cath last year being medically managed with several admissions for chest pain/neck pain over the last several months.  Pt complains that the pain starts in his anterior chest, that is initially relieved with ntg at home, but then progresses to his neck and radiates down his left arm.  Painful with movement of his left arm.  Thought to be msc in nature on the last several admits.  Has not been evaluated by his pcp for cervical/msc etiology of his pain.  There was some mention of him getting a repeat cath last month but he ended up leaving AMA due to his wifes medical problems with cancer.  No fevers.  No n/v.  No cough.    Review of Systems:  Positive and negative as per HPI otherwise all other systems are negative  Past Medical History: Past Medical History  Diagnosis Date  . COPD (chronic obstructive pulmonary disease)   . Essential hypertension, benign   . Chronic back pain   . Anxiety disorder   . Hyperlipidemia   . Depression   . Obesity   . History of alcohol abuse   . History of cocaine abuse   . Pneumonia   . GERD (gastroesophageal reflux disease)   . Headache(784.0)   . Arthritis   . CAD (coronary artery disease)     a. ~ 2000 cath at [facility];  b. 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   Past Surgical History  Procedure Laterality Date  . Hip surgery      rt  . Cholecystectomy    . Back surgery    . Nasal/sinus endoscopy    . Total hip arthroplasty  05/08/2012    Procedure: TOTAL HIP ARTHROPLASTY;  Surgeon: Nestor Lewandowsky, MD;  Location: [facility] OR;  Service: Orthopedics;  Laterality: Left;  . Joint replacement    . Left heart catheterization with coronary angiogram N/A 09/12/2013    Procedure: LEFT HEART CATHETERIZATION WITH  CORONARY ANGIOGRAM;  Surgeon: Kathleene Hazel, MD;  Location: [facility] CATH LAB;  Service: Cardiovascular;  Laterality: N/A;  . Total hip arthroplasty  2014 per pt    Medications: Prior to Admission medications   Medication Sig Start Date End Date Taking? Authorizing Provider  ALPRAZolam Prudy Feeler) 1 MG tablet Take 1 tablet (1 mg total) by mouth 3 (three) times daily as needed for anxiety. For anxiety 05/15/12  Yes Kathlen Mody, MD  amLODipine (NORVASC) 5 MG tablet Take 5 mg by mouth daily. 08/06/14  Yes Historical Provider, MD  aspirin 81 MG tablet Take 1 tablet (81 mg total) by mouth daily. 05/31/12  Yes Kathlen Mody, MD  atorvastatin (LIPITOR) 10 MG tablet Take 1 tablet (10 mg total) by mouth daily. 10/14/14  Yes Jonelle Sidle, MD  budesonide-formoterol [facility]) 160-4.5 MCG/ACT inhaler Inhale 2 puffs into the lungs 2 (two) times daily.   Yes Historical Provider, MD  citalopram (CELEXA) 40 MG tablet Take 40 mg by mouth daily.     Yes Historical Provider, MD  doxepin (SINEQUAN) 25 MG capsule Take 25 mg by mouth at bedtime.  08/06/14  Yes Historical Provider, MD  furosemide (LASIX) 40 MG tablet Take 1 tablet (40 mg total) by mouth  daily. 12/06/13  Yes Catarina Hartshorn, MD  hydrochlorothiazide (HYDRODIURIL) 25 MG tablet Take 25 mg by mouth daily. 08/06/14  Yes Historical Provider, MD  ipratropium-albuterol (DUONEB) 0.5-2.5 (3) MG/3ML SOLN Take 3 mLs by nebulization every 6 (six) hours as needed (shortness of breath/ wheezing.).    Yes Historical Provider, MD  isosorbide mononitrate (IMDUR) 30 MG 24 hr tablet Take 1 tablet (30 mg total) by mouth 2 (two) times daily. Patient taking differently: Take 30 mg by mouth daily.  08/12/14  Yes Nishant Dhungel, MD  meloxicam (MOBIC) 15 MG tablet Take 15 mg by mouth daily.  08/13/13  Yes Historical Provider, MD  nitroGLYCERIN (NITROSTAT) 0.4 MG SL tablet Place 0.4 mg under the tongue every 5 (five) minutes as needed. For chest pain   Yes Historical Provider, MD   omeprazole (PRILOSEC) 20 MG capsule Take 20 mg by mouth daily.   Yes Historical Provider, MD  oxyCODONE-acetaminophen (PERCOCET) 10-325 MG per tablet Take 1 tablet by mouth every 8 (eight) hours as needed for pain.  09/30/14  Yes Historical Provider, MD  potassium chloride SA (K-DUR,KLOR-CON) 20 MEQ tablet Take 20 mEq by mouth 2 (two) times daily.  08/13/13  Yes Historical Provider, MD  PROAIR HFA 108 (90 BASE) MCG/ACT inhaler Inhale 2 puffs into the lungs daily as needed for wheezing or shortness of breath.  06/20/13  Yes Historical Provider, MD  ranolazine (RANEXA) 500 MG 12 hr tablet Take 1 tablet (500 mg total) by mouth 2 (two) times daily. 08/12/14  Yes Nishant Dhungel, MD  traZODone (DESYREL) 100 MG tablet Take 100 mg by mouth at bedtime.    Yes Historical Provider, MD  cyclobenzaprine (FLEXERIL) 5 MG tablet Take 1 tablet (5 mg total) by mouth 3 (three) times daily as needed for muscle spasms. 10/20/14   Erick Blinks, MD  nicotine (NICODERM CQ) 21 mg/24hr patch Place 1 patch (21 mg total) onto the skin daily. Patient not taking: Reported on 09/18/2014 08/12/14   Eddie North, MD    Allergies:   Allergies  Allergen Reactions  . Ativan [Lorazepam] Other (See Comments)    "out of mind"  . Dilaudid [Hydromorphone Hcl] Other (See Comments)    " out of mind"  . Haloperidol Decanoate     Social History:  reports that he has been smoking Cigarettes.  He has a 9.75 pack-year smoking history. He has never used smokeless tobacco. He reports that he does not drink alcohol or use illicit drugs.  Family History: Family History  Problem Relation Age of Onset  . Heart attack Mother   . Heart attack Brother   . Heart attack Sister     Physical Exam: Filed Vitals:   10/22/14 1930 10/22/14 1947 10/22/14 2000 10/22/14 2030  BP: 124/75  99/81 107/67  Pulse: 89  85 76  Temp:      TempSrc:      Resp: 21  17 20   Height:      Weight:      SpO2: 95% 96% 94% 96%   General appearance:  alert, cooperative and no distress Head: Normocephalic, without obvious abnormality, atraumatic Eyes: negative Nose: Nares normal. Septum midline. Mucosa normal. No drainage or sinus tenderness. Neck: no JVD and supple, symmetrical, trachea midline Lungs: clear to auscultation bilaterally Heart: regular rate and rhythm, S1, S2 normal, no murmur, click, rub or gallop Abdomen: soft, non-tender; bowel sounds normal; no masses,  no organomegaly Extremities: extremities normal, atraumatic, no cyanosis or edema  Pain with passive internal Ardean Larsen  rotations of left shoulder easily reproducible. Pulses: 2+ and symmetric Skin: Skin color, texture, turgor normal. No rashes or lesions Neurologic: Grossly normal    Labs on Admission:   Recent Labs  10/20/14 0557 10/22/14 1925  NA 136 136  K 3.5 3.2*  CL 100 102  CO2 29 23  GLUCOSE 140* 158*  BUN 11 19  CREATININE 1.05 1.17  CALCIUM 9.2 9.1    Recent Labs  10/22/14 1925  WBC 10.7*  HGB 13.2  HCT 39.1  MCV 94.2  PLT 236    Recent Labs  10/22/14 1925  TROPONINI <0.03   Radiological Exams on Admission: None ekg nsr noacute issues  Assessment/Plan  59 yo male with recurrent atypical chest pain, neck pain  Principal Problem:   Chest pain-  Romi.  Cardiology consult.  I see no documentation that there was plan for repeat cath per his reporting.  Will await further recommendations from the cardiology team.  If negative, pt will need f/u with his pcp for evaluation of his neck pain/shoulder pain which i believe is the etiology of his pain.  Cont home meds  Active Problems:   OBESITY, MORBID   ANXIETY DISORDER, GENERALIZED   Essential hypertension, benign   Sleep apnea- C-pap intol  obs on tele.  Await cardiology evaluation.  Full code.  Malerie Eakins A 10/22/2014, 10:12 PM

## 2014-10-22 NOTE — ED Notes (Signed)
Pt was recently in hospital for CP. Discharged Sunday. Pt states he has had pain ever since. Pt has had 3 nitro in the last hour with relief. EMS gave another nitro in route and yeilded relief. EMS also gave ASA 324 mg. IV est. NAD noted at this time. Pt states he is SOB, denies diaphoresis, dizziness with walking.

## 2014-10-22 NOTE — ED Provider Notes (Signed)
CSN: [id number]     Arrival date & time 10/22/14  1837 History   First MD Initiated Contact with Patient 10/22/14 1857     Chief Complaint  Patient presents with  . Chest Pain     (Consider location/radiation/quality/duration/timing/severity/associated sxs/prior Treatment) HPI Comments: Patient presents to the ER for evaluation of chest pain. Patient reports that he has been experiencing left-sided chest pain since he left the hospital. Patient reports that he was admitted to the hospital 4 days ago. He stayed overnight and was discharged. He has been using pain medicine and muscle relaxers without improvement. He has been trying nitroglycerin as well.  Patient is a 59 y.o. male presenting with chest pain.  Chest Pain   Past Medical History  Diagnosis Date  . COPD (chronic obstructive pulmonary disease)   . Essential hypertension, benign   . Chronic back pain   . Anxiety disorder   . Hyperlipidemia   . Depression   . Obesity   . History of alcohol abuse   . History of cocaine abuse   . Pneumonia   . GERD (gastroesophageal reflux disease)   . Headache(784.0)   . Arthritis   . CAD (coronary artery disease)     a. ~ 2000 cath at [facility];  b. 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   Past Surgical History  Procedure Laterality Date  . Hip surgery      rt  . Cholecystectomy    . Back surgery    . Nasal/sinus endoscopy    . Total hip arthroplasty  05/08/2012    Procedure: TOTAL HIP ARTHROPLASTY;  Surgeon: Nestor Lewandowsky, MD;  Location: [facility] OR;  Service: Orthopedics;  Laterality: Left;  . Joint replacement    . Left heart catheterization with coronary angiogram N/A 09/12/2013    Procedure: LEFT HEART CATHETERIZATION WITH CORONARY ANGIOGRAM;  Surgeon: Kathleene Hazel, MD;  Location: [facility] CATH LAB;  Service: Cardiovascular;  Laterality: N/A;   . Total hip arthroplasty  2014 per pt   Family History  Problem Relation Age of Onset  . Heart attack Mother   . Heart attack Brother   . Heart attack Sister    History  Substance Use Topics  . Smoking status: Current Every Day Smoker -- 0.25 packs/day for 39 years    Types: Cigarettes  . Smokeless tobacco: Never Used  . Alcohol Use: No     Comment: denies    Review of Systems  Cardiovascular: Positive for chest pain.  All other systems reviewed and are negative.     Allergies  Ativan; Dilaudid; and Haloperidol decanoate  Home Medications   Prior to Admission medications   Medication Sig Start Date End Date Taking? Authorizing Provider  ALPRAZolam Prudy Feeler) 1 MG tablet Take 1 tablet (1 mg total) by mouth 3 (three) times daily as needed for anxiety. For anxiety 05/15/12  Yes Kathlen Mody, MD  amLODipine (NORVASC) 5 MG tablet Take 5 mg by mouth daily. 08/06/14  Yes Historical Provider, MD  aspirin 81 MG tablet Take 1 tablet (81 mg total) by mouth daily. 05/31/12  Yes Kathlen Mody, MD  atorvastatin (LIPITOR) 10 MG tablet Take 1 tablet (10 mg total) by mouth daily. 10/14/14  Yes Jonelle Sidle, MD  budesonide-formoterol [facility]) 160-4.5 MCG/ACT inhaler Inhale 2 puffs into the lungs 2 (two) times  daily.   Yes Historical Provider, MD  citalopram (CELEXA) 40 MG tablet Take 40 mg by mouth daily.     Yes Historical Provider, MD  doxepin (SINEQUAN) 25 MG capsule Take 25 mg by mouth at bedtime.  08/06/14  Yes Historical Provider, MD  furosemide (LASIX) 40 MG tablet Take 1 tablet (40 mg total) by mouth daily. 12/06/13  Yes Catarina Hartshorn, MD  hydrochlorothiazide (HYDRODIURIL) 25 MG tablet Take 25 mg by mouth daily. 08/06/14  Yes Historical Provider, MD  ipratropium-albuterol (DUONEB) 0.5-2.5 (3) MG/3ML SOLN Take 3 mLs by nebulization every 6 (six) hours as needed (shortness of breath/ wheezing.).    Yes Historical Provider, MD  isosorbide mononitrate (IMDUR) 30 MG 24 hr tablet Take 1 tablet  (30 mg total) by mouth 2 (two) times daily. Patient taking differently: Take 30 mg by mouth daily.  08/12/14  Yes Nishant Dhungel, MD  meloxicam (MOBIC) 15 MG tablet Take 15 mg by mouth daily.  08/13/13  Yes Historical Provider, MD  nitroGLYCERIN (NITROSTAT) 0.4 MG SL tablet Place 0.4 mg under the tongue every 5 (five) minutes as needed. For chest pain   Yes Historical Provider, MD  omeprazole (PRILOSEC) 20 MG capsule Take 20 mg by mouth daily.   Yes Historical Provider, MD  oxyCODONE-acetaminophen (PERCOCET) 10-325 MG per tablet Take 1 tablet by mouth every 8 (eight) hours as needed for pain.  09/30/14  Yes Historical Provider, MD  potassium chloride SA (K-DUR,KLOR-CON) 20 MEQ tablet Take 20 mEq by mouth 2 (two) times daily.  08/13/13  Yes Historical Provider, MD  PROAIR HFA 108 (90 BASE) MCG/ACT inhaler Inhale 2 puffs into the lungs daily as needed for wheezing or shortness of breath.  06/20/13  Yes Historical Provider, MD  ranolazine (RANEXA) 500 MG 12 hr tablet Take 1 tablet (500 mg total) by mouth 2 (two) times daily. 08/12/14  Yes Nishant Dhungel, MD  traZODone (DESYREL) 100 MG tablet Take 100 mg by mouth at bedtime.    Yes Historical Provider, MD  cyclobenzaprine (FLEXERIL) 5 MG tablet Take 1 tablet (5 mg total) by mouth 3 (three) times daily as needed for muscle spasms. 10/20/14   Erick Blinks, MD  nicotine (NICODERM CQ) 21 mg/24hr patch Place 1 patch (21 mg total) onto the skin daily. Patient not taking: Reported on 09/18/2014 08/12/14   Nishant Dhungel, MD   BP 107/67 mmHg  Pulse 76  Temp(Src) 99.1 F (37.3 C) (Oral)  Resp 20  Ht  (1.905 m)  Wt 321 lb (145.605 kg)  BMI 40.12 kg/m2  SpO2 96% Physical Exam  Constitutional: He is oriented to person, place, and time. He appears well-developed and well-nourished. No distress.  HENT:  Head: Normocephalic and atraumatic.  Right Ear: Hearing normal.  Left Ear: Hearing normal.  Nose: Nose normal.  Mouth/Throat: Oropharynx is clear  and moist and mucous membranes are normal.  Eyes: Conjunctivae and EOM are normal. Pupils are equal, round, and reactive to light.  Neck: Normal range of motion. Neck supple.  Cardiovascular: Regular rhythm, S1 normal and S2 normal.  Exam reveals no gallop and no friction rub.   No murmur heard. Pulmonary/Chest: Effort normal and breath sounds normal. No respiratory distress. He exhibits no tenderness.  Abdominal: Soft. Normal appearance and bowel sounds are normal. There is no hepatosplenomegaly. There is no tenderness. There is no rebound, no guarding, no tenderness at McBurney's point and negative Murphy's sign. No hernia.  Musculoskeletal: Normal range of motion.  Neurological: He is alert and  oriented to person, place, and time. He has normal strength. No cranial nerve deficit or sensory deficit. Coordination normal. GCS eye subscore is 4. GCS verbal subscore is 5. GCS motor subscore is 6.  Skin: Skin is warm, dry and intact. No rash noted. No cyanosis.  Psychiatric: He has a normal mood and affect. His speech is normal and behavior is normal. Thought content normal.  Nursing note and vitals reviewed.   ED Course  Procedures (including critical care time) Labs Review Labs Reviewed  BASIC METABOLIC PANEL - Abnormal; Notable for the following:    Potassium 3.2 (*)    Glucose, Bld 158 (*)    GFR calc non Af Amer 68 (*)    GFR calc Af Amer 79 (*)    All other components within normal limits  CBC - Abnormal; Notable for the following:    WBC 10.7 (*)    RBC 4.15 (*)    All other components within normal limits  BRAIN NATRIURETIC PEPTIDE  TROPONIN I    Imaging Review No results found.   EKG Interpretation   Date/Time:  Tuesday October 22 2014 18:51:26 EST Ventricular Rate:  87 PR Interval:  188 QRS Duration: 101 QT Interval:  423 QTC Calculation: 509 R Axis:   0 Text Interpretation:  Sinus rhythm Prolonged QT interval Baseline wander  in lead(s) V5 No significant change  since last tracing Confirmed by  Christal Lagerstrom  MD, Lannah Koike [phone]) on 10/22/2014 7:16:50 PM      MDM   Final diagnoses:  None   chest pain  Patient presents to the ER for evaluation of chest pain. Patient has been seen previously for similar symptoms. Patient was recently hospitalized overnight and ruled out, but was not evaluated by cardiology. His cardiologist at the back to the ER today because he has been having chest pain. He does report that there is some improvement when he takes nitroglycerin, but there is also a clear musculoskeletal component to his pain. Patient has worsening pain when he moves his left arm and the pain radiates up into the neck with movement. Patient had a cardiac catheterization 1 year ago. He had 50-70% blockages at that time. He has been medically managed since.    Gilda Crease, MD 10/22/14 2151

## 2014-10-23 LAB — TROPONIN I

## 2014-10-23 MED ORDER — MORPHINE SULFATE 2 MG/ML IJ SOLN
2.0000 mg | Freq: Once | INTRAMUSCULAR | Status: AC
  Administered 2014-10-23: 2 mg via INTRAVENOUS
  Filled 2014-10-23: qty 1

## 2014-10-23 MED ORDER — MORPHINE SULFATE 2 MG/ML IJ SOLN
1.0000 mg | Freq: Once | INTRAMUSCULAR | Status: AC
  Administered 2014-10-23: 1 mg via INTRAVENOUS
  Filled 2014-10-23: qty 1

## 2014-10-23 MED ORDER — OXYCODONE-ACETAMINOPHEN 5-325 MG PO TABS
1.0000 | ORAL_TABLET | Freq: Once | ORAL | Status: AC
  Administered 2014-10-23: 1 via ORAL

## 2014-10-23 MED ORDER — OXYCODONE HCL 5 MG PO TABS
5.0000 mg | ORAL_TABLET | Freq: Once | ORAL | Status: AC
  Administered 2014-10-23: 5 mg via ORAL

## 2014-10-23 NOTE — Progress Notes (Signed)
Pt left AMA, stated wife would pick him up and he would be "checked out" in [place].  IV site removed, WDL. VSS. Pt signed AMA form per protocol. Verbalized understanding. Dr. Sherrie Mustache made aware. Pt left floor via WC accompanied by patient advocate.

## 2014-10-23 NOTE — Consult Note (Signed)
Reason for Consult: Chest pain Referring Physician: PTH Cardiologist: [name] is an 59 y.o. male.  HPI: This is a 59 year old male patient of Dr. 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 Medical therapy was recommended. He presented to the ER with chest pain in January 2016 and MI ruled out. Patient signed out AMA. He was in the hospital again on 10/20/14 with atypical chest pain and given Flexeril for musculoskeletal pain. Patient also has a history of syncope with positive's on telemetry so was taken off beta blocker. He has a history of hypertension, hyperlipidemia, obesity, alcohol abuse, tobacco abuse, COPD, GERD, and anxiety depression.  He presented again last night with recurrent chest pain. Troponins negative, EKG unchanged, BNP normal, potassium 3.2.  Past Medical History  Diagnosis Date  . COPD (chronic obstructive pulmonary disease)   . Essential hypertension, benign   . Chronic back pain   . Anxiety disorder   . Hyperlipidemia   . Depression   . Obesity   . History of alcohol abuse   . History of cocaine abuse   . Pneumonia   . GERD (gastroesophageal reflux disease)   . Headache(784.0)   . Arthritis   . CAD (coronary artery disease)     a. ~ 2000 cath at [facility];  b. 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    Past Surgical History  Procedure Laterality Date  . Hip surgery      rt  . Cholecystectomy    . Back surgery    . Nasal/sinus endoscopy    . Total hip arthroplasty  05/08/2012    Procedure: TOTAL HIP ARTHROPLASTY;  Surgeon: FKerin Salen MD;  [place]  Service: [place]opedics;  Laterality: Left;  . Joint replacement    . Left heart catheterization with coronary angiogram N/A 09/12/2013   Procedure: LEFT HEART CATHETERIZATION WITH CORONARY ANGIOGRAM;  Surgeon: CBurnell Blanks MD;  [facility];  Service: Cardiovascular;  Laterality: N/A;  . Total hip arthroplasty  2014 per pt    Family History  Problem Relation Age of Onset  . Heart attack Mother   . Heart attack Brother   . Heart attack Sister     Social History:  reports that he has been smoking Cigarettes.  He has a 9.75 pack-year smoking history. He has never used smokeless tobacco. He reports that he does not drink alcohol or use illicit drugs.  Allergies:  Allergies  Allergen Reactions  . Ativan [Lorazepam] Other (See Comments)    "out of mind"  . Dilaudid [Hydromorphone Hcl] Other (See Comments)    " out of mind"  . Haloperidol Decanoate     Medications:  Scheduled Meds: . amLODipine  5 mg Oral Daily  . aspirin  81 mg Oral Daily  . atorvastatin  10 mg Oral q1800  . citalopram  40 mg Oral Daily  . doxepin  25 mg Oral QHS  . furosemide  40 mg Oral Daily  . hydrochlorothiazide  25 mg Oral Daily  . isosorbide mononitrate  30 mg Oral BID  . meloxicam  15 mg Oral Daily  . mometasone-formoterol  2 puff Inhalation BID  . pantoprazole  40 mg Oral Daily  . potassium  chloride SA  20 mEq Oral BID  . ranolazine  500 mg Oral BID  . traZODone  100 mg Oral QHS   Continuous Infusions:  PRN Meds:.acetaminophen, ALPRAZolam, cyclobenzaprine, ipratropium-albuterol, ondansetron (ZOFRAN) IV, oxyCODONE-acetaminophen **AND** oxyCODONE   Results for orders placed or performed during the hospital encounter of 10/22/14 (from the past 48 hour(s))  BNP (order ONLY if patient complains of dyspnea/SOB AND you have documented it for THIS visit)     Status: None   Collection Time: 10/22/14  7:25 PM  Result Value Ref Range   B Natriuretic Peptide 23.0 0.0 - 100.0 pg/mL  Basic metabolic panel     Status: Abnormal   Collection Time: 10/22/14  7:25 PM  Result Value Ref Range   Sodium 136 135 - 145 mmol/L    Potassium 3.2 (L) 3.5 - 5.1 mmol/L   Chloride 102 96 - 112 mmol/L   CO2 23 19 - 32 mmol/L   Glucose, Bld 158 (H) 70 - 99 mg/dL   BUN 19 6 - 23 mg/dL   Creatinine, Ser 1.17 0.50 - 1.35 mg/dL   Calcium 9.1 8.4 - 10.5 mg/dL   GFR calc non Af Amer 68 (L) >90 mL/min   GFR calc Af Amer 79 (L) >90 mL/min    Comment: (NOTE) The eGFR has been calculated using the CKD EPI equation. This calculation has not been validated in all clinical situations. eGFR's persistently <90 mL/min signify possible Chronic Kidney Disease.    Anion gap 11 5 - 15  CBC     Status: Abnormal   Collection Time: 10/22/14  7:25 PM  Result Value Ref Range   WBC 10.7 (H) 4.0 - 10.5 K/uL   RBC 4.15 (L) 4.22 - 5.81 MIL/uL   Hemoglobin 13.2 13.0 - 17.0 g/dL   HCT 39.1 39.0 - 52.0 %   MCV 94.2 78.0 - 100.0 fL   MCH 31.8 26.0 - 34.0 pg   MCHC 33.8 30.0 - 36.0 g/dL   RDW 12.8 11.5 - 15.5 %   Platelets 236 150 - 400 K/uL  Troponin I (MHP)     Status: None   Collection Time: 10/22/14  7:25 PM  Result Value Ref Range   Troponin I <0.03 <0.031 ng/mL    Comment:        NO INDICATION OF MYOCARDIAL INJURY.   Troponin I (q 6hr x 3)     Status: None   Collection Time: 10/22/14 11:35 PM  Result Value Ref Range   Troponin I <0.03 <0.031 ng/mL    Comment:        NO INDICATION OF MYOCARDIAL INJURY.   Troponin I (q 6hr x 3)     Status: None   Collection Time: 10/23/14  5:09 AM  Result Value Ref Range   Troponin I <0.03 <0.031 ng/mL    Comment:        NO INDICATION OF MYOCARDIAL INJURY.     No results found.  ROS    Blood pressure 123/60, pulse 75, temperature 98.2 F (36.8 C), temperature source Oral, resp. rate 20, height '6\' 3"'  (1.905 m), weight 320 lb (145.151 kg), SpO2 94 %. Physical Exam     Assessment/Plan: Chest pain: MI ruled out with negative enzymes. Patient signed out [place] before I could examine him. F/U as Outpatient.  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  COPD  Tobacco abuse  Morbid Obesity  Hypertension  History  of syncope with pauses on beta blocker while hospitalized in 2015  [facility] 10/23/2014, 7:59 AM

## 2014-10-23 NOTE — Progress Notes (Signed)
UR completed 

## 2014-11-11 ENCOUNTER — Other Ambulatory Visit: Payer: Self-pay | Admitting: *Deleted

## 2014-11-11 MED ORDER — ATORVASTATIN CALCIUM 10 MG PO TABS: 10.0000 mg | ORAL_TABLET | Freq: Every day | ORAL | Status: DC

## 2014-11-25 ENCOUNTER — Ambulatory Visit (INDEPENDENT_AMBULATORY_CARE_PROVIDER_SITE_OTHER): Payer: Medicare HMO | Admitting: Cardiology

## 2014-11-25 ENCOUNTER — Encounter: Payer: Self-pay | Admitting: Cardiology

## 2014-11-25 VITALS — BP 126/82 | HR 74 | Ht 75.0 in | Wt 348.8 lb

## 2014-11-25 DIAGNOSIS — I10 Essential (primary) hypertension: Secondary | ICD-10-CM | POA: Diagnosis not present

## 2014-11-25 DIAGNOSIS — I25119 Atherosclerotic heart disease of native coronary artery with unspecified angina pectoris: Secondary | ICD-10-CM | POA: Diagnosis not present

## 2014-11-25 DIAGNOSIS — Z720 Tobacco use: Secondary | ICD-10-CM

## 2014-11-25 NOTE — Progress Notes (Signed)
Cardiology Office Note  Date: 11/25/2014   ID: [name], DOB February 18, 1956, MRN [id number]  PCP: [name], MD  Primary Cardiologist: Nona Dell, MD   Chief Complaint  Patient presents with  . Coronary Artery Disease  . Hospitalization Follow-up    History of Present Illness: [name] is a 59 y.o. male last seen by me in January 2015. Interval records reviewed including follow-up evaluations in the ER in January as well as March, signed out AMA. He is here with his wife today, tells me that he has not had any progressive angina symptoms, no nitroglycerin use in the last month. We reviewed his medications, he remains on aspirin, Lipitor, Ranexa, Imdur, and Norvasc.  He continues to smoke cigarettes, has not been able to quit over time. We discussed diet and exercise today with a goal of additional weight loss. He states that he wants to lose "100 pounds."  I reviewed his most recent lab work and ECG outlined below.   Past Medical History  Diagnosis Date  . COPD (chronic obstructive pulmonary disease)   . Essential hypertension, benign   . Chronic back pain   . Anxiety disorder   . Hyperlipidemia   . Depression   . Obesity   . History of alcohol abuse   . History of cocaine abuse   . Pneumonia   . GERD (gastroesophageal reflux disease)   . Headache(784.0)   . Arthritis   . CAD (coronary artery disease)     a. ~ 2000 cath at [facility];  b. 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     Current Outpatient Prescriptions  Medication Sig Dispense Refill  . ALPRAZolam (XANAX) 1 MG tablet Take 1 tablet (1 mg total) by mouth 3 (three) times daily as needed for anxiety. For anxiety 30 tablet 0  . amLODipine (NORVASC) 5 MG tablet Take 5 mg by mouth daily.    Marland Kitchen aspirin 81 MG tablet Take 1 tablet (81 mg total) by mouth daily. 30  tablet 0  . atorvastatin (LIPITOR) 10 MG tablet Take 1 tablet (10 mg total) by mouth daily. 30 tablet 0  . budesonide-formoterol (SYMBICORT) 160-4.5 MCG/ACT inhaler Inhale 2 puffs into the lungs 2 (two) times daily.    . citalopram (CELEXA) 40 MG tablet Take 40 mg by mouth daily.      . cyclobenzaprine (FLEXERIL) 5 MG tablet Take 1 tablet (5 mg total) by mouth 3 (three) times daily as needed for muscle spasms. 30 tablet 0  . doxepin (SINEQUAN) 25 MG capsule Take 25 mg by mouth at bedtime.     . furosemide (LASIX) 40 MG tablet Take 1 tablet (40 mg total) by mouth daily. 30 tablet 0  . hydrochlorothiazide (HYDRODIURIL) 25 MG tablet Take 25 mg by mouth daily.    Marland Kitchen ipratropium-albuterol (DUONEB) 0.5-2.5 (3) MG/3ML SOLN Take 3 mLs by nebulization every 6 (six) hours as needed (shortness of breath/ wheezing.).     Marland Kitchen isosorbide mononitrate (IMDUR) 30 MG 24 hr tablet Take 1 tablet (30 mg total) by mouth 2 (two) times daily. (Patient taking differently: Take 30 mg by mouth daily. ) 60 tablet 0  . meloxicam (MOBIC) 15 MG tablet Take 15 mg by mouth daily.     . nitroGLYCERIN (NITROSTAT) 0.4 MG SL tablet Place 0.4 mg under the  tongue every 5 (five) minutes as needed. For chest pain    . omeprazole (PRILOSEC) 20 MG capsule Take 20 mg by mouth daily.    Marland Kitchen oxyCODONE-acetaminophen (PERCOCET) 10-325 MG per tablet Take 1 tablet by mouth every 8 (eight) hours as needed for pain.     . potassium chloride SA (K-DUR,KLOR-CON) 20 MEQ tablet Take 20 mEq by mouth 2 (two) times daily.     Marland Kitchen PROAIR HFA 108 (90 BASE) MCG/ACT inhaler Inhale 2 puffs into the lungs daily as needed for wheezing or shortness of breath.     . ranolazine (RANEXA) 500 MG 12 hr tablet Take 1 tablet (500 mg total) by mouth 2 (two) times daily. 60 tablet 0  . traZODone (DESYREL) 100 MG tablet Take 100 mg by mouth at bedtime.      No current facility-administered medications for this visit.    Allergies:  Ativan; Dilaudid; and Haloperidol decanoate     Social History: The patient  reports that he has been smoking Cigarettes.  He has a 9.75 pack-year smoking history. He has never used smokeless tobacco. He reports that he does not drink alcohol or use illicit drugs.    ROS:  Please see the history of present illness. Otherwise, complete review of systems is positive for none.  All other systems are reviewed and negative.   Physical Exam: VS:  BP 126/82 mmHg  Pulse 74  Ht  (1.905 m)  Wt 348 lb 12.8 oz (158.215 kg)  BMI 43.60 kg/m2  SpO2 95%, BMI Body mass index is 43.6 kg/(m^2).  Wt Readings from Last 3 Encounters:  11/25/14 348 lb 12.8 oz (158.215 kg)  10/22/14 320 lb (145.151 kg)  10/19/14 323 lb (146.512 kg)     Morbidly obese male, no distress.  HEENT: Conjunctiva and lids normal, oropharynx clear.  Neck: Supple, increased girth, difficult to assess JVP, no thyromegaly.  Lungs: Clear to auscultation, decreased breath sounds, nonlabored breathing at rest.  Cardiac: Regular rate and rhythm, no S3 or significant systolic murmur, no pericardial rub.  Abdomen: Soft, nontender, obese, bowel sounds present.  Extremities: Chronic appearing mild edema, distal pulses 2+.  Skin: Warm and dry. Multiple tattoos.  Musculoskeletal: No kyphosis.  Neuropsychiatric: Alert and oriented x3, affect grossly appropriate.   ECG: Tracing from 10/23/2014 showed sinus rhythm, borderline prolonged QT interval, no acute ST segment changes.   Recent Labwork: 08/08/2014: TSH 1.180 08/09/2014: Pro B Natriuretic peptide (BNP) 29.7 08/10/2014: Magnesium 1.9 10/19/2014: ALT 22; AST 19 10/22/2014: B Natriuretic Peptide 23.0; BUN 19; Creatinine 1.17; Hemoglobin 13.2; Platelets 236; Potassium 3.2*; Sodium 136     Component Value Date/Time   CHOL 157 09/14/2013 0510   TRIG 305* 09/14/2013 0510   HDL 25* 09/14/2013 0510   CHOLHDL 6.3 09/14/2013 0510   VLDL 61* 09/14/2013 0510   LDLCALC 71 09/14/2013 0510   LDLDIRECT 105* 01/19/2007 2014   Troponin I less than 0.03  3  Other Studies Reviewed Today:  Echocardiogram 08/09/2014: Study Conclusions  - Procedure narrative: Transthoracic echocardiography. Image quality was poor. The study was technically difficult, as a result of poor sound wave transmission and body habitus. - Left ventricle: The cavity size was normal. There was moderate concentric hypertrophy. Systolic function was normal. The estimated ejection fraction was in the range of 60% to 65%. While the images were inadequate for LV wall motion assessment, there were no gross regional wall abnormalities. Findings consistent with left ventricular diastolic dysfunction. There was no evidence of elevated ventricular  filling pressure by Doppler parameters. - Left atrium: The atrium was mildly dilated.  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  Left Anterior Descending Artery: Large caliber vessel that does not reach the apex. There is 30% ostial/proximal stenosis. The mid vessel has a 30% stenosis.   Circumflex Artery: Moderate caliber vessel with three small obtuse marginal branches. There is diffuse 30% proximal stenosis, 50% mid stenosis at the takeoff of the third OM branch with 50% ostial stenosis in the small caliber third OM branch. Moderate caliber ramus intermediate branch with proximal 30% stenosis.   Right Coronary Artery: Large dominant vessel with 40% ostial stenosis, mild mid vessel irregularity. The posterolateral branch is a large caliber vessel. Distally, the posterolateral branch bifurcates and becomes small in caliber. In the distal branch, there is a 70% stenosis (1.75 mm vessel). The PDA has mild plaque disease.   Left Ventricular Angiogram: LVEF=50-55%.    Assessment and Plan:  1. History of largely nonobstructive CAD as outlined above, plan is to continue medical therapy. No changes made to  current regimen. I have encouraged diet and exercise plan with weight loss. Also needs to stop smoking, but has not been able to quit over time. No clear indication for repeat ischemic testing at this time.  2. Morbid obesity, we discussed diet and exercise.  3. Hyperlipidemia followed by Dr. Olena Leatherwood, continues on Lipitor.  4. Essential hypertension, blood pressure control is good today.  Current medicines were reviewed with the patient today. No changes made.  Disposition: FU with me in 6 months.   Signed, Jonelle Sidle, MD, [facility] 11/25/2014 9:39 AM    [facility] [address] [place], [place], [place] [address] Phone: [phone]; Fax: [phone]

## 2014-11-25 NOTE — Patient Instructions (Signed)
Your physician wants you to follow-up in: 6 months with DR. Diona Browner You will receive a reminder letter in the mail two months in advance. If you don't receive a letter, please call our office to schedule the follow-up appointment.  Your physician recommends that you continue on your current medications as directed. Please refer to the Current Medication list given to you today.  Thank you for choosing [facility]!!

## 2015-01-07 ENCOUNTER — Other Ambulatory Visit: Payer: Self-pay | Admitting: *Deleted

## 2015-01-07 MED ORDER — ATORVASTATIN CALCIUM 10 MG PO TABS: 10.0000 mg | ORAL_TABLET | Freq: Every day | ORAL | Status: DC

## 2015-04-29 ENCOUNTER — Encounter (HOSPITAL_COMMUNITY): Payer: Self-pay | Admitting: Emergency Medicine

## 2015-04-29 ENCOUNTER — Emergency Department (HOSPITAL_COMMUNITY)
Admission: EM | Admit: 2015-04-29 | Discharge: 2015-04-29 | Discharge status: Against Medical Advice | Payer: Medicare HMO | Attending: Emergency Medicine | Admitting: Emergency Medicine

## 2015-04-29 DIAGNOSIS — Z7982 Long term (current) use of aspirin: Secondary | ICD-10-CM | POA: Insufficient documentation

## 2015-04-29 DIAGNOSIS — R609 Edema, unspecified: Secondary | ICD-10-CM | POA: Diagnosis not present

## 2015-04-29 DIAGNOSIS — M1990 Unspecified osteoarthritis, unspecified site: Secondary | ICD-10-CM | POA: Insufficient documentation

## 2015-04-29 DIAGNOSIS — G8929 Other chronic pain: Secondary | ICD-10-CM | POA: Diagnosis not present

## 2015-04-29 DIAGNOSIS — I951 Orthostatic hypotension: Secondary | ICD-10-CM

## 2015-04-29 DIAGNOSIS — R197 Diarrhea, unspecified: Secondary | ICD-10-CM | POA: Insufficient documentation

## 2015-04-29 DIAGNOSIS — J449 Chronic obstructive pulmonary disease, unspecified: Secondary | ICD-10-CM | POA: Diagnosis not present

## 2015-04-29 DIAGNOSIS — I509 Heart failure, unspecified: Secondary | ICD-10-CM | POA: Diagnosis not present

## 2015-04-29 DIAGNOSIS — I2510 Atherosclerotic heart disease of native coronary artery without angina pectoris: Secondary | ICD-10-CM | POA: Diagnosis not present

## 2015-04-29 DIAGNOSIS — R531 Weakness: Secondary | ICD-10-CM | POA: Diagnosis present

## 2015-04-29 DIAGNOSIS — F329 Major depressive disorder, single episode, unspecified: Secondary | ICD-10-CM | POA: Diagnosis not present

## 2015-04-29 DIAGNOSIS — E785 Hyperlipidemia, unspecified: Secondary | ICD-10-CM | POA: Insufficient documentation

## 2015-04-29 DIAGNOSIS — Z79899 Other long term (current) drug therapy: Secondary | ICD-10-CM | POA: Diagnosis not present

## 2015-04-29 DIAGNOSIS — E669 Obesity, unspecified: Secondary | ICD-10-CM | POA: Diagnosis not present

## 2015-04-29 DIAGNOSIS — Z7951 Long term (current) use of inhaled steroids: Secondary | ICD-10-CM | POA: Insufficient documentation

## 2015-04-29 DIAGNOSIS — F419 Anxiety disorder, unspecified: Secondary | ICD-10-CM | POA: Diagnosis not present

## 2015-04-29 DIAGNOSIS — R55 Syncope and collapse: Secondary | ICD-10-CM | POA: Diagnosis not present

## 2015-04-29 DIAGNOSIS — R1084 Generalized abdominal pain: Secondary | ICD-10-CM | POA: Diagnosis not present

## 2015-04-29 DIAGNOSIS — I10 Essential (primary) hypertension: Secondary | ICD-10-CM | POA: Diagnosis not present

## 2015-04-29 DIAGNOSIS — M791 Myalgia: Secondary | ICD-10-CM | POA: Insufficient documentation

## 2015-04-29 DIAGNOSIS — Z720 Tobacco use: Secondary | ICD-10-CM | POA: Diagnosis not present

## 2015-04-29 DIAGNOSIS — Z8701 Personal history of pneumonia (recurrent): Secondary | ICD-10-CM | POA: Insufficient documentation

## 2015-04-29 DIAGNOSIS — R630 Anorexia: Secondary | ICD-10-CM | POA: Insufficient documentation

## 2015-04-29 DIAGNOSIS — Z791 Long term (current) use of non-steroidal anti-inflammatories (NSAID): Secondary | ICD-10-CM | POA: Diagnosis not present

## 2015-04-29 DIAGNOSIS — R05 Cough: Secondary | ICD-10-CM | POA: Insufficient documentation

## 2015-04-29 DIAGNOSIS — K219 Gastro-esophageal reflux disease without esophagitis: Secondary | ICD-10-CM | POA: Diagnosis not present

## 2015-04-29 DIAGNOSIS — R079 Chest pain, unspecified: Secondary | ICD-10-CM

## 2015-04-29 LAB — COMPREHENSIVE METABOLIC PANEL
ALK PHOS: 46 U/L (ref 38–126)
ALT: 19 U/L (ref 17–63)
AST: 20 U/L (ref 15–41)
Albumin: 3.7 g/dL (ref 3.5–5.0)
Anion gap: 9 (ref 5–15)
BUN: 11 mg/dL (ref 6–20)
CALCIUM: 8.8 mg/dL — AB (ref 8.9–10.3)
CO2: 27 mmol/L (ref 22–32)
CREATININE: 1.12 mg/dL (ref 0.61–1.24)
Chloride: 101 mmol/L (ref 101–111)
Glucose, Bld: 95 mg/dL (ref 65–99)
Potassium: 3.5 mmol/L (ref 3.5–5.1)
Sodium: 137 mmol/L (ref 135–145)
TOTAL PROTEIN: 7.7 g/dL (ref 6.5–8.1)
Total Bilirubin: 0.8 mg/dL (ref 0.3–1.2)

## 2015-04-29 LAB — LIPASE, BLOOD: Lipase: 20 U/L — ABNORMAL LOW (ref 22–51)

## 2015-04-29 LAB — CBC WITH DIFFERENTIAL/PLATELET
BASOS PCT: 1 % (ref 0–1)
Basophils Absolute: 0.1 10*3/uL (ref 0.0–0.1)
EOS ABS: 0.4 10*3/uL (ref 0.0–0.7)
Eosinophils Relative: 4 % (ref 0–5)
HCT: 39.2 % (ref 39.0–52.0)
Hemoglobin: 13.2 g/dL (ref 13.0–17.0)
Lymphocytes Relative: 24 % (ref 12–46)
Lymphs Abs: 2.4 10*3/uL (ref 0.7–4.0)
MCH: 32.4 pg (ref 26.0–34.0)
MCHC: 33.7 g/dL (ref 30.0–36.0)
MCV: 96.3 fL (ref 78.0–100.0)
Monocytes Absolute: 1.1 10*3/uL — ABNORMAL HIGH (ref 0.1–1.0)
Monocytes Relative: 11 % (ref 3–12)
NEUTROS PCT: 60 % (ref 43–77)
Neutro Abs: 5.8 10*3/uL (ref 1.7–7.7)
Platelets: 217 10*3/uL (ref 150–400)
RBC: 4.07 MIL/uL — AB (ref 4.22–5.81)
RDW: 12.6 % (ref 11.5–15.5)
WBC: 9.7 10*3/uL (ref 4.0–10.5)

## 2015-04-29 LAB — I-STAT CG4 LACTIC ACID, ED: LACTIC ACID, VENOUS: 1.43 mmol/L (ref 0.5–2.0)

## 2015-04-29 LAB — TROPONIN I

## 2015-04-29 NOTE — ED Provider Notes (Signed)
CSN: [id number]     Arrival date & time 04/29/15  2131 History  This chart was scribed for Tilden Fossa, MD by Budd Palmer, ED Scribe. This patient was seen in room APA19/APA19 and the patient's care was started at 10:34 PM.    Chief Complaint  Patient presents with  . Weakness   The history is provided by the patient. No language interpreter was used.   HPI Comments: [name] is a 59 y.o. male with a PMHx of COPD, HLD, CAD, and CHF, who presents to the Emergency Department complaining of weakness onset 1 day ago. He reports associated loss of appetite, abdominal pain (onset 2 nights ago), productive cough (dark green, onset 1 day ago), diarrhea (3-4x daily, onset 3 days ago), and generalized myalgias. He notes he was seen at [name] 2 days ago for CP, where he was diagnosed with inflammation of the lungs and was sent home with antibiotics. He states he fell after he got home as he was having difficulty standing. He is on percocet and xanax (2x daily) at home. He denies ever being out of these medications or over-using them. Pt denies fever and CP.  Past Medical History  Diagnosis Date  . COPD (chronic obstructive pulmonary disease)   . Essential hypertension, benign   . Chronic back pain   . Anxiety disorder   . Hyperlipidemia   . Depression   . Obesity   . History of alcohol abuse   . History of cocaine abuse   . Pneumonia   . GERD (gastroesophageal reflux disease)   . Headache(784.0)   . Arthritis   . CAD (coronary artery disease)     a. ~ 2000 cath at [facility];  b. 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  . CHF (congestive heart failure)    Past Surgical History  Procedure Laterality Date  . Hip surgery      rt  . Cholecystectomy    . Back surgery    . Nasal/sinus endoscopy    . Total hip arthroplasty  05/08/2012    Procedure:  TOTAL HIP ARTHROPLASTY;  Surgeon: Nestor Lewandowsky, MD;  Location: [facility] OR;  Service: Orthopedics;  Laterality: Left;  . Joint replacement    . Left heart catheterization with coronary angiogram N/A 09/12/2013    Procedure: LEFT HEART CATHETERIZATION WITH CORONARY ANGIOGRAM;  Surgeon: Kathleene Hazel, MD;  Location: [facility] CATH LAB;  Service: Cardiovascular;  Laterality: N/A;  . Total hip arthroplasty  2014 per pt   Family History  Problem Relation Age of Onset  . Heart attack Mother   . Heart attack Brother   . Heart attack Sister    Social History  Substance Use Topics  . Smoking status: Current Every Day Smoker -- 0.25 packs/day for 39 years  . Smokeless tobacco: Never Used  . Alcohol Use: No     Comment: denies    Review of Systems  Constitutional: Positive for appetite change. Negative for fever.  Respiratory: Positive for cough.   Cardiovascular: Negative for chest pain.  Gastrointestinal: Positive for abdominal pain and diarrhea.  Musculoskeletal: Positive for myalgias.  Neurological: Positive for weakness.  All other systems reviewed and are negative.   Allergies  Ativan; Dilaudid; and Haloperidol decanoate  Home Medications   Prior to Admission medications   Medication Sig Start  Date End Date Taking? Authorizing Provider  ALPRAZolam Prudy Feeler) 1 MG tablet Take 1 tablet (1 mg total) by mouth 3 (three) times daily as needed for anxiety. For anxiety 05/15/12   Kathlen Mody, MD  amLODipine (NORVASC) 5 MG tablet Take 5 mg by mouth daily. 08/06/14   Historical Provider, MD  aspirin 81 MG tablet Take 1 tablet (81 mg total) by mouth daily. 05/31/12   Kathlen Mody, MD  atorvastatin (LIPITOR) 10 MG tablet Take 1 tablet (10 mg total) by mouth daily. 01/07/15   Jonelle Sidle, MD  budesonide-formoterol [facility]) 160-4.5 MCG/ACT inhaler Inhale 2 puffs into the lungs 2 (two) times daily.    Historical Provider, MD  citalopram (CELEXA) 40 MG tablet Take 40 mg by mouth daily.       Historical Provider, MD  cyclobenzaprine (FLEXERIL) 5 MG tablet Take 1 tablet (5 mg total) by mouth 3 (three) times daily as needed for muscle spasms. 10/20/14   Erick Blinks, MD  doxepin (SINEQUAN) 25 MG capsule Take 25 mg by mouth at bedtime.  08/06/14   Historical Provider, MD  furosemide (LASIX) 40 MG tablet Take 1 tablet (40 mg total) by mouth daily. 12/06/13   Catarina Hartshorn, MD  hydrochlorothiazide (HYDRODIURIL) 25 MG tablet Take 25 mg by mouth daily. 08/06/14   Historical Provider, MD  ipratropium-albuterol (DUONEB) 0.5-2.5 (3) MG/3ML SOLN Take 3 mLs by nebulization every 6 (six) hours as needed (shortness of breath/ wheezing.).     Historical Provider, MD  isosorbide mononitrate (IMDUR) 30 MG 24 hr tablet Take 1 tablet (30 mg total) by mouth 2 (two) times daily. Patient taking differently: Take 30 mg by mouth daily.  08/12/14   Nishant Dhungel, MD  meloxicam (MOBIC) 15 MG tablet Take 15 mg by mouth daily.  08/13/13   Historical Provider, MD  nitroGLYCERIN (NITROSTAT) 0.4 MG SL tablet Place 0.4 mg under the tongue every 5 (five) minutes as needed. For chest pain    Historical Provider, MD  omeprazole (PRILOSEC) 20 MG capsule Take 20 mg by mouth daily.    Historical Provider, MD  oxyCODONE-acetaminophen (PERCOCET) 10-325 MG per tablet Take 1 tablet by mouth every 8 (eight) hours as needed for pain.  09/30/14   Historical Provider, MD  potassium chloride SA (K-DUR,KLOR-CON) 20 MEQ tablet Take 20 mEq by mouth 2 (two) times daily.  08/13/13   Historical Provider, MD  PROAIR HFA 108 (90 BASE) MCG/ACT inhaler Inhale 2 puffs into the lungs daily as needed for wheezing or shortness of breath.  06/20/13   Historical Provider, MD  ranolazine (RANEXA) 500 MG 12 hr tablet Take 1 tablet (500 mg total) by mouth 2 (two) times daily. 08/12/14   Nishant Dhungel, MD  SPIRIVA HANDIHALER 18 MCG inhalation capsule  03/17/15   Historical Provider, MD  traZODone (DESYREL) 100 MG tablet Take 100 mg by mouth at bedtime.      Historical Provider, MD  VOLTAREN 1 % GEL  02/27/15   Historical Provider, MD   BP 108/73 mmHg  Pulse 76  Temp(Src) 97.9 F (36.6 C)  Resp 20  Ht 6\' 3"  (1.905 m)  Wt 354 lb (160.573 kg)  BMI 44.25 kg/m2  SpO2 97% Physical Exam  Constitutional: He is oriented to person, place, and time. He appears well-developed and well-nourished.  HENT:  Head: Normocephalic and atraumatic.  Cardiovascular: Normal rate and regular rhythm.   No murmur heard. Pulmonary/Chest: Effort normal and breath sounds normal. No respiratory distress.  Abdominal: Soft. There is tenderness.  There is no rebound and no guarding.  Mild-moderate diffuse TTP  Musculoskeletal: He exhibits edema. He exhibits no tenderness.  Bilateral pitting edema to the LEs  Neurological: He is alert and oriented to person, place, and time.  Skin: Skin is warm and dry.  Psychiatric: He has a normal mood and affect. His behavior is normal.  Nursing note and vitals reviewed.   ED Course  Procedures  DIAGNOSTIC STUDIES: Oxygen Saturation is 97% on RA, adequate by my interpretation.    COORDINATION OF CARE: 10:42 PM - Discussed plans to review pt's past medical records and order diagnostic studies. Pt advised of plan for treatment and pt agrees.  Labs Review Labs Reviewed - No data to display  Imaging Review No results found. I have personally reviewed and evaluated these images and lab results as part of my medical decision-making.   EKG Interpretation None      MDM   Final diagnoses:  None    Patient here for generalized weakness, abdominal pain, recently discharged from [facility]. Patient is requesting narcotic pain medications for his pain. Discussed with patient that we need to do an evaluation to find the source of his weakness before we give him pain medications as this will make him feel more weak. Patient left the emergency department prior to full evaluation. He refused to sign papers for medical records  from [facility].  I personally performed the services described in this documentation, which was scribed in my presence. The recorded information has been reviewed and is accurate.   Tilden Fossa, MD 04/30/15 [phone]

## 2015-04-29 NOTE — ED Notes (Signed)
Pt had recent discharged from [facility] hospital and now c/o generalized weakness.

## 2015-04-30 ENCOUNTER — Emergency Department (HOSPITAL_COMMUNITY): Payer: Medicare HMO

## 2015-04-30 ENCOUNTER — Encounter (HOSPITAL_COMMUNITY): Payer: Self-pay | Admitting: Emergency Medicine

## 2015-04-30 ENCOUNTER — Observation Stay (HOSPITAL_COMMUNITY)
Admission: EM | Admit: 2015-04-30 | Discharge: 2015-04-30 | Disposition: A | Discharge status: Home / Self Care | Payer: Medicare HMO | Attending: Internal Medicine | Admitting: Internal Medicine

## 2015-04-30 DIAGNOSIS — R079 Chest pain, unspecified: Secondary | ICD-10-CM | POA: Diagnosis present

## 2015-04-30 DIAGNOSIS — E669 Obesity, unspecified: Secondary | ICD-10-CM | POA: Insufficient documentation

## 2015-04-30 DIAGNOSIS — G8929 Other chronic pain: Secondary | ICD-10-CM | POA: Diagnosis present

## 2015-04-30 DIAGNOSIS — Z7982 Long term (current) use of aspirin: Secondary | ICD-10-CM | POA: Diagnosis not present

## 2015-04-30 DIAGNOSIS — Z8701 Personal history of pneumonia (recurrent): Secondary | ICD-10-CM | POA: Insufficient documentation

## 2015-04-30 DIAGNOSIS — J441 Chronic obstructive pulmonary disease with (acute) exacerbation: Principal | ICD-10-CM | POA: Insufficient documentation

## 2015-04-30 DIAGNOSIS — F1011 Alcohol abuse, in remission: Secondary | ICD-10-CM | POA: Diagnosis present

## 2015-04-30 DIAGNOSIS — M1990 Unspecified osteoarthritis, unspecified site: Secondary | ICD-10-CM | POA: Insufficient documentation

## 2015-04-30 DIAGNOSIS — Z791 Long term (current) use of non-steroidal anti-inflammatories (NSAID): Secondary | ICD-10-CM | POA: Diagnosis not present

## 2015-04-30 DIAGNOSIS — I509 Heart failure, unspecified: Secondary | ICD-10-CM | POA: Insufficient documentation

## 2015-04-30 DIAGNOSIS — I10 Essential (primary) hypertension: Secondary | ICD-10-CM | POA: Diagnosis present

## 2015-04-30 DIAGNOSIS — I2510 Atherosclerotic heart disease of native coronary artery without angina pectoris: Secondary | ICD-10-CM | POA: Insufficient documentation

## 2015-04-30 DIAGNOSIS — K219 Gastro-esophageal reflux disease without esophagitis: Secondary | ICD-10-CM | POA: Diagnosis not present

## 2015-04-30 DIAGNOSIS — F329 Major depressive disorder, single episode, unspecified: Secondary | ICD-10-CM | POA: Diagnosis not present

## 2015-04-30 DIAGNOSIS — F411 Generalized anxiety disorder: Secondary | ICD-10-CM | POA: Diagnosis not present

## 2015-04-30 DIAGNOSIS — Z720 Tobacco use: Secondary | ICD-10-CM | POA: Diagnosis not present

## 2015-04-30 DIAGNOSIS — E785 Hyperlipidemia, unspecified: Secondary | ICD-10-CM | POA: Insufficient documentation

## 2015-04-30 DIAGNOSIS — Z79899 Other long term (current) drug therapy: Secondary | ICD-10-CM | POA: Insufficient documentation

## 2015-04-30 DIAGNOSIS — R55 Syncope and collapse: Secondary | ICD-10-CM | POA: Diagnosis present

## 2015-04-30 DIAGNOSIS — M549 Dorsalgia, unspecified: Secondary | ICD-10-CM | POA: Diagnosis not present

## 2015-04-30 DIAGNOSIS — I951 Orthostatic hypotension: Secondary | ICD-10-CM | POA: Diagnosis not present

## 2015-04-30 DIAGNOSIS — R0602 Shortness of breath: Secondary | ICD-10-CM | POA: Diagnosis present

## 2015-04-30 DIAGNOSIS — I5030 Unspecified diastolic (congestive) heart failure: Secondary | ICD-10-CM | POA: Insufficient documentation

## 2015-04-30 DIAGNOSIS — I25118 Atherosclerotic heart disease of native coronary artery with other forms of angina pectoris: Secondary | ICD-10-CM | POA: Diagnosis present

## 2015-04-30 DIAGNOSIS — F1411 Cocaine abuse, in remission: Secondary | ICD-10-CM | POA: Diagnosis present

## 2015-04-30 LAB — BASIC METABOLIC PANEL
Anion gap: 10 (ref 5–15)
BUN: 13 mg/dL (ref 6–20)
CO2: 24 mmol/L (ref 22–32)
CREATININE: 1.14 mg/dL (ref 0.61–1.24)
Calcium: 8.7 mg/dL — ABNORMAL LOW (ref 8.9–10.3)
Chloride: 102 mmol/L (ref 101–111)
GFR calc Af Amer: >60 mL/min (ref >60)
GLUCOSE: 198 mg/dL — AB (ref 65–99)
POTASSIUM: 3.1 mmol/L — AB (ref 3.5–5.1)
SODIUM: 136 mmol/L (ref 135–145)

## 2015-04-30 LAB — RAPID URINE DRUG SCREEN, HOSP PERFORMED
Amphetamines: NOT DETECTED
Barbiturates: NOT DETECTED
Benzodiazepines: POSITIVE — AB
Cocaine: NOT DETECTED
Opiates: NOT DETECTED
Tetrahydrocannabinol: NOT DETECTED

## 2015-04-30 LAB — CBC
HEMATOCRIT: 38 % — AB (ref 39.0–52.0)
Hemoglobin: 13.1 g/dL (ref 13.0–17.0)
MCH: 33.1 pg (ref 26.0–34.0)
MCHC: 34.5 g/dL (ref 30.0–36.0)
MCV: 96 fL (ref 78.0–100.0)
PLATELETS: 233 10*3/uL (ref 150–400)
RBC: 3.96 MIL/uL — ABNORMAL LOW (ref 4.22–5.81)
RDW: 12.5 % (ref 11.5–15.5)
WBC: 7.2 10*3/uL (ref 4.0–10.5)

## 2015-04-30 LAB — TROPONIN I: Troponin I: <0.03 ng/mL (ref <0.031)

## 2015-04-30 LAB — CBG MONITORING, ED: GLUCOSE-CAPILLARY: 114 mg/dL — AB (ref 65–99)

## 2015-04-30 LAB — BRAIN NATRIURETIC PEPTIDE: B Natriuretic Peptide: 29 pg/mL (ref 0.0–100.0)

## 2015-04-30 MED ORDER — IPRATROPIUM-ALBUTEROL 0.5-2.5 (3) MG/3ML IN SOLN: 3.0000 mL | Freq: Four times a day (QID) | RESPIRATORY_TRACT | Status: DC | PRN

## 2015-04-30 MED ORDER — IPRATROPIUM-ALBUTEROL 0.5-2.5 (3) MG/3ML IN SOLN
3.0000 mL | Freq: Four times a day (QID) | RESPIRATORY_TRACT | Status: DC
Start: 2015-04-30 — End: 2015-04-30
  Administered 2015-04-30: 3 mL via RESPIRATORY_TRACT
  Filled 2015-04-30 (×2): qty 3

## 2015-04-30 MED ORDER — ALBUTEROL SULFATE (2.5 MG/3ML) 0.083% IN NEBU: 2.5000 mg | INHALATION_SOLUTION | RESPIRATORY_TRACT | Status: DC | PRN

## 2015-04-30 MED ORDER — IPRATROPIUM BROMIDE 0.02 % IN SOLN: 0.5000 mg | Freq: Four times a day (QID) | RESPIRATORY_TRACT | Status: DC

## 2015-04-30 MED ORDER — OXYCODONE-ACETAMINOPHEN 5-325 MG PO TABS
ORAL_TABLET | ORAL | Status: AC
  Administered 2015-04-30: 1 via ORAL
  Filled 2015-04-30: qty 1

## 2015-04-30 MED ORDER — SODIUM CHLORIDE 0.9 % IJ SOLN
3.0000 mL | Freq: Two times a day (BID) | INTRAMUSCULAR | Status: DC
  Administered 2015-04-30: 3 mL via INTRAVENOUS

## 2015-04-30 MED ORDER — KETOROLAC TROMETHAMINE 30 MG/ML IJ SOLN
30.0000 mg | Freq: Once | INTRAMUSCULAR | Status: AC
  Administered 2015-04-30: 30 mg via INTRAVENOUS
  Filled 2015-04-30: qty 1

## 2015-04-30 MED ORDER — RANOLAZINE ER 500 MG PO TB12
500.0000 mg | ORAL_TABLET | Freq: Two times a day (BID) | ORAL | Status: DC
  Administered 2015-04-30: 500 mg via ORAL
  Filled 2015-04-30: qty 1

## 2015-04-30 MED ORDER — ALUM & MAG HYDROXIDE-SIMETH 200-200-20 MG/5ML PO SUSP: 30.0000 mL | Freq: Four times a day (QID) | ORAL | Status: DC | PRN

## 2015-04-30 MED ORDER — ATORVASTATIN CALCIUM 10 MG PO TABS
10.0000 mg | ORAL_TABLET | Freq: Every day | ORAL | Status: DC
  Administered 2015-04-30: 10 mg via ORAL
  Filled 2015-04-30: qty 1

## 2015-04-30 MED ORDER — ONDANSETRON HCL 4 MG/2ML IJ SOLN: 4.0000 mg | Freq: Four times a day (QID) | INTRAMUSCULAR | Status: DC | PRN

## 2015-04-30 MED ORDER — ALBUTEROL SULFATE (2.5 MG/3ML) 0.083% IN NEBU: 2.5000 mg | INHALATION_SOLUTION | Freq: Four times a day (QID) | RESPIRATORY_TRACT | Status: DC

## 2015-04-30 MED ORDER — DOXEPIN HCL 25 MG PO CAPS: 25.0000 mg | ORAL_CAPSULE | Freq: Every day | ORAL | Status: DC

## 2015-04-30 MED ORDER — AMMONIA AROMATIC IN INHA
RESPIRATORY_TRACT | Status: AC
  Filled 2015-04-30: qty 10

## 2015-04-30 MED ORDER — ALPRAZOLAM 1 MG PO TABS
1.0000 mg | ORAL_TABLET | Freq: Three times a day (TID) | ORAL | Status: DC | PRN
  Administered 2015-04-30: 1 mg via ORAL
  Filled 2015-04-30: qty 1

## 2015-04-30 MED ORDER — TRAZODONE HCL 50 MG PO TABS: 100.0000 mg | ORAL_TABLET | Freq: Every day | ORAL | Status: DC

## 2015-04-30 MED ORDER — IPRATROPIUM-ALBUTEROL 0.5-2.5 (3) MG/3ML IN SOLN
3.0000 mL | Freq: Once | RESPIRATORY_TRACT | Status: AC
  Administered 2015-04-30: 3 mL via RESPIRATORY_TRACT
  Filled 2015-04-30: qty 3

## 2015-04-30 MED ORDER — ONDANSETRON HCL 4 MG PO TABS: 4.0000 mg | ORAL_TABLET | Freq: Four times a day (QID) | ORAL | Status: DC | PRN

## 2015-04-30 MED ORDER — CYCLOBENZAPRINE HCL 10 MG PO TABS: 5.0000 mg | ORAL_TABLET | Freq: Three times a day (TID) | ORAL | Status: DC | PRN

## 2015-04-30 MED ORDER — METHYLPREDNISOLONE SODIUM SUCC 125 MG IJ SOLR
125.0000 mg | Freq: Once | INTRAMUSCULAR | Status: AC
  Administered 2015-04-30: 125 mg via INTRAVENOUS
  Filled 2015-04-30: qty 2

## 2015-04-30 MED ORDER — POTASSIUM CHLORIDE CRYS ER 20 MEQ PO TBCR
20.0000 meq | EXTENDED_RELEASE_TABLET | Freq: Two times a day (BID) | ORAL | Status: DC
  Administered 2015-04-30: 20 meq via ORAL
  Filled 2015-04-30: qty 1

## 2015-04-30 MED ORDER — MELOXICAM 7.5 MG PO TABS
15.0000 mg | ORAL_TABLET | Freq: Every day | ORAL | Status: DC
  Administered 2015-04-30: 15 mg via ORAL
  Filled 2015-04-30: qty 1
  Filled 2015-04-30 (×2): qty 2

## 2015-04-30 MED ORDER — SODIUM CHLORIDE 0.9 % IV SOLN
INTRAVENOUS | Status: DC
  Administered 2015-04-30: 05:00:00 via INTRAVENOUS

## 2015-04-30 MED ORDER — OXYCODONE-ACETAMINOPHEN 5-325 MG PO TABS
ORAL_TABLET | Freq: Three times a day (TID) | ORAL | Status: DC | PRN
  Administered 2015-04-30: 1 via ORAL
  Filled 2015-04-30: qty 1

## 2015-04-30 MED ORDER — BUDESONIDE-FORMOTEROL FUMARATE 160-4.5 MCG/ACT IN AERO
2.0000 | INHALATION_SPRAY | Freq: Two times a day (BID) | RESPIRATORY_TRACT | Status: DC
  Administered 2015-04-30: 2 via RESPIRATORY_TRACT
  Filled 2015-04-30: qty 6

## 2015-04-30 MED ORDER — ALBUTEROL SULFATE (2.5 MG/3ML) 0.083% IN NEBU: 3.0000 mL | INHALATION_SOLUTION | Freq: Every day | RESPIRATORY_TRACT | Status: DC | PRN

## 2015-04-30 MED ORDER — CITALOPRAM HYDROBROMIDE 20 MG PO TABS
40.0000 mg | ORAL_TABLET | Freq: Every day | ORAL | Status: DC
  Administered 2015-04-30: 40 mg via ORAL
  Filled 2015-04-30: qty 2

## 2015-04-30 MED ORDER — OXYCODONE-ACETAMINOPHEN 5-325 MG PO TABS
1.0000 | ORAL_TABLET | Freq: Once | ORAL | Status: AC
  Administered 2015-04-30: 1 via ORAL

## 2015-04-30 MED ORDER — ALBUTEROL (5 MG/ML) CONTINUOUS INHALATION SOLN
10.0000 mg/h | INHALATION_SOLUTION | Freq: Once | RESPIRATORY_TRACT | Status: AC
  Administered 2015-04-30: 10 mg/h via RESPIRATORY_TRACT
  Filled 2015-04-30: qty 20

## 2015-04-30 MED ORDER — ASPIRIN 81 MG PO CHEW
81.0000 mg | CHEWABLE_TABLET | Freq: Every day | ORAL | Status: DC
  Administered 2015-04-30: 81 mg via ORAL
  Filled 2015-04-30: qty 1

## 2015-04-30 NOTE — ED Notes (Signed)
Pt c/o increased sob while walking outside of the ED. Pt was seen earlier here for weakness.

## 2015-04-30 NOTE — Discharge Summary (Signed)
Physician Discharge Summary  TERI UVA GHW:[id number] DOB: [birth year] DOA: 04/29/2015  PCP: [name], MD  Admit date: 04/29/2015 Discharge date: 04/30/2015  Time spent: 15 minutes  1. PATIENT LEFT THE HOSPITAL AGAINST MEDICAL ADVICE  Discharge Diagnoses:  Orthostatic hypotension Syncope Essential hypertension Chronic back pain Polysubstance abuse Chest pain, chronic Anxiety   Filed Weights   04/29/15 2134  Weight: 160.573 kg (354 lb)    History of present illness and hospital course:  Patient presents at the hospital with complaints of shortness of breath and chest pain. He was found to have 2 normal troponins, but left the hospital AGAINST MEDICAL ADVICE. He became increasingly short of breath and came back to the emergency room for evaluation. He has syncopal episode and briefly became apneic. There was no seizure activity. He reports having dizzy spells for approximately 6 weeks. He was noted to be orthostatic in the emergency room. He was admitted to the hospital for further evaluation. Cardiac enzymes found to be negative. He was started on IV fluids and blood pressure medications were held. The following day, the patient insisted on being discharged home. He will follow hospital AGAINST MEDICAL ADVICE prior to being seen by me.   Allergies  Allergen Reactions  . Ativan [Lorazepam] Other (See Comments)    "out of mind"  . Dilaudid [Hydromorphone Hcl] Other (See Comments)    " out of mind"  . Haloperidol Decanoate       The results of significant diagnostics from this hospitalization (including imaging, microbiology, ancillary and laboratory) are listed below for reference.    Significant Diagnostic Studies: Dg Chest 2 View  04/30/2015   CLINICAL DATA:  Patient was seen in the ED earlier tonight due to weakness. As patient was leaving the ED, he began experiencing severe shortness of breath.  EXAM: CHEST  2 VIEW  COMPARISON:  04/24/2015  FINDINGS: Shallow  inspiration. Mild cardiac enlargement. Central vascular congestion. No consolidation or edema. No blunting of costophrenic angles. No pneumothorax. Mediastinal contours are intact. Degenerative changes in the spine. No significant change since prior study.  IMPRESSION: Cardiac enlargement with mild pulmonary vascular congestion. Similar prior study. No consolidation or edema.   Electronically Signed   By: Burman Nieves M.D.   On: 04/30/2015 01:57   Ct Head Wo Contrast  04/30/2015   EXAM: CT HEAD WITHOUT CONTRAST  TECHNIQUE: Contiguous axial images were obtained from the base of the skull through the vertex without intravenous contrast.  COMPARISON:  Head CT dated 06/18/2011  FINDINGS: The ventricles and the sulci are appropriate in size for the patient's age. There is no intracranial hemorrhage. No midline shift or mass effect identified. The gray-white matter differentiation is preserved.  There is mild mucoperiosteal thickening of the frontal sinuses with partial opacification of the ethmoid air cells. The maxillary, sphenoid sinuses are well aerated. The mastoid air cells are clear. Left maxillary bone fixation sutures noted. There is irregularity of the nasal bone, likely related to old trauma. The calvarium is intact. Small high-density focus is noted in the subcutaneous soft tissues of the left forehead similar to the prior study.  IMPRESSION: No acute intracranial pathology.   Electronically Signed   By: Elgie Collard M.D.   On: 04/30/2015 04:02    Microbiology: No results found for this or any previous visit (from the past 240 hour(s)).   Labs: Basic Metabolic Panel:  Recent Labs Lab 04/29/15 2245 04/30/15 0630  NA 137 136  K 3.5 3.1*  CL 101 102  CO2 27 24  GLUCOSE 95 198*  BUN 11 13  CREATININE 1.12 1.14  CALCIUM 8.8* 8.7*   Liver Function Tests:  Recent Labs Lab 04/29/15 2245  AST 20  ALT 19  ALKPHOS 46  BILITOT 0.8  PROT 7.7  ALBUMIN 3.7    Recent Labs Lab  04/29/15 2245  LIPASE 20*   No results for input(s): AMMONIA in the last 168 hours. CBC:  Recent Labs Lab 04/29/15 2245 04/30/15 0630  WBC 9.7 7.2  NEUTROABS 5.8  --   HGB 13.2 13.1  HCT 39.2 38.0*  MCV 96.3 96.0  PLT 217 233   Cardiac Enzymes:  Recent Labs Lab 04/29/15 2245 04/30/15 0015 04/30/15 0315  TROPONINI <0.03 <0.03 <0.03   BNP: BNP (last 3 results)  Recent Labs  10/18/14 2115 10/22/14 1925 04/29/15 2245  BNP 15.0 23.0 29.0    ProBNP (last 3 results)  Recent Labs  08/08/14 1755 08/09/14 0314  PROBNP 27.9 29.7    CBG:  Recent Labs Lab 04/30/15 0321  GLUCAP 114*       Signed:  MEMON,JEHANZEB  [facility] 04/30/2015, 7:12 PM

## 2015-04-30 NOTE — H&P (Signed)
PCP:   [name], MD   Chief Complaint:  Passed out  HPI: 59 yo male h/o copd, chf, htn, GAD, nonobstructive CAD, chronic chest pain initially came to the ED for sob and chest pain.  He had had 2 normal trop, and had left AMA (he frequently does) and was in the waiting room when the staff noticed he was very sob and convinced him to come back into the ED.  He was given several breathing treatments and solumedrol and improved.  Then he had a syncopal spell where he passed out and became apneic but remained with a pulse and woke up in less than a minute or so , there was no seizure activity.  Pt reports that his bp meds were recently doubled, and his lasix was stopped.  He has been having these dizzy spells for over 6 weeks when he gets up to walk and he gets dizzy and very weak.  He was hospitalized at [facility] last week with similar symptoms, that is when his lasix was stopped.  Pt has chronic pain "everywhere", takes percocet tid.  Has not had any percocet since noon yesterday.  He is just complaining of the ED bed killing his back.  His breathing is back to his baseline.  i had the nurses do orthostatics with me in the room, just upon sitting with legs hanging on side of bed patient gets hypotensive (sbp in upper 80s) and dizzy (lying down his sbp was good in the 120s) HR remained same in 70-80 range.  We did not stand him up.  Pt referred for admission for is syncopal episode.  Review of Systems:  Positive and negative as per HPI otherwise all other systems are negative  Past Medical History: Past Medical History  Diagnosis Date  . COPD (chronic obstructive pulmonary disease)   . Essential hypertension, benign   . Chronic back pain   . Anxiety disorder   . Hyperlipidemia   . Depression   . Obesity   . History of alcohol abuse   . History of cocaine abuse   . Pneumonia   . GERD (gastroesophageal reflux disease)   . Headache(784.0)   . Arthritis   . CAD (coronary artery disease)      a. ~ 2000 cath at [facility];  b. 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  . CHF (congestive heart failure)    Past Surgical History  Procedure Laterality Date  . Hip surgery      rt  . Cholecystectomy    . Back surgery    . Nasal/sinus endoscopy    . Total hip arthroplasty  05/08/2012    Procedure: TOTAL HIP ARTHROPLASTY;  Surgeon: Nestor Lewandowsky, MD;  Location: [facility] OR;  Service: Orthopedics;  Laterality: Left;  . Joint replacement    . Left heart catheterization with coronary angiogram N/A 09/12/2013    Procedure: LEFT HEART CATHETERIZATION WITH CORONARY ANGIOGRAM;  Surgeon: Kathleene Hazel, MD;  Location: [facility] CATH LAB;  Service: Cardiovascular;  Laterality: N/A;  . Total hip arthroplasty  2014 per pt    Medications: Prior to Admission medications   Medication Sig Start Date End Date Taking? Authorizing Provider  ALPRAZolam Prudy Feeler) 1 MG tablet Take 1 tablet (1 mg total) by mouth 3 (three) times daily as needed for anxiety. For anxiety 05/15/12  Kathlen Mody, MD  amLODipine (NORVASC) 5 MG tablet Take 5 mg by mouth daily. 08/06/14   Historical Provider, MD  aspirin 81 MG tablet Take 1 tablet (81 mg total) by mouth daily. 05/31/12   Kathlen Mody, MD  atorvastatin (LIPITOR) 10 MG tablet Take 1 tablet (10 mg total) by mouth daily. 01/07/15   Jonelle Sidle, MD  budesonide-formoterol [facility]) 160-4.5 MCG/ACT inhaler Inhale 2 puffs into the lungs 2 (two) times daily.    Historical Provider, MD  citalopram (CELEXA) 40 MG tablet Take 40 mg by mouth daily.      Historical Provider, MD  cyclobenzaprine (FLEXERIL) 5 MG tablet Take 1 tablet (5 mg total) by mouth 3 (three) times daily as needed for muscle spasms. 10/20/14   Erick Blinks, MD  doxepin (SINEQUAN) 25 MG capsule Take 25 mg by mouth at bedtime.  08/06/14   Historical Provider, MD  furosemide  (LASIX) 40 MG tablet Take 1 tablet (40 mg total) by mouth daily. 12/06/13   Catarina Hartshorn, MD  hydrochlorothiazide (HYDRODIURIL) 25 MG tablet Take 25 mg by mouth daily. 08/06/14   Historical Provider, MD  ipratropium-albuterol (DUONEB) 0.5-2.5 (3) MG/3ML SOLN Take 3 mLs by nebulization every 6 (six) hours as needed (shortness of breath/ wheezing.).     Historical Provider, MD  isosorbide mononitrate (IMDUR) 30 MG 24 hr tablet Take 1 tablet (30 mg total) by mouth 2 (two) times daily. Patient taking differently: Take 30 mg by mouth daily.  08/12/14   Nishant Dhungel, MD  meloxicam (MOBIC) 15 MG tablet Take 15 mg by mouth daily.  08/13/13   Historical Provider, MD  nitroGLYCERIN (NITROSTAT) 0.4 MG SL tablet Place 0.4 mg under the tongue every 5 (five) minutes as needed. For chest pain    Historical Provider, MD  omeprazole (PRILOSEC) 20 MG capsule Take 20 mg by mouth daily.    Historical Provider, MD  oxyCODONE-acetaminophen (PERCOCET) 10-325 MG per tablet Take 1 tablet by mouth every 8 (eight) hours as needed for pain.  09/30/14   Historical Provider, MD  potassium chloride SA (K-DUR,KLOR-CON) 20 MEQ tablet Take 20 mEq by mouth 2 (two) times daily.  08/13/13   Historical Provider, MD  PROAIR HFA 108 (90 BASE) MCG/ACT inhaler Inhale 2 puffs into the lungs daily as needed for wheezing or shortness of breath.  06/20/13   Historical Provider, MD  ranolazine (RANEXA) 500 MG 12 hr tablet Take 1 tablet (500 mg total) by mouth 2 (two) times daily. 08/12/14   Nishant Dhungel, MD  SPIRIVA HANDIHALER 18 MCG inhalation capsule  03/17/15   Historical Provider, MD  traZODone (DESYREL) 100 MG tablet Take 100 mg by mouth at bedtime.     Historical Provider, MD  VOLTAREN 1 % GEL  02/27/15   Historical Provider, MD    Allergies:   Allergies  Allergen Reactions  . Ativan [Lorazepam] Other (See Comments)    "out of mind"  . Dilaudid [Hydromorphone Hcl] Other (See Comments)    " out of mind"  . Haloperidol Decanoate      Social History:  reports that he has been smoking.  He has never used smokeless tobacco. He reports that he does not drink alcohol or use illicit drugs.  Family History: Family History  Problem Relation Age of Onset  . Heart attack Mother   . Heart attack Brother   . Heart attack Sister     Physical Exam: Filed Vitals:   04/30/15 0345 04/30/15 0400 04/30/15 0415 04/30/15 0430  BP:      Pulse: 79 82 82 84  Temp:      Resp: 24 19 19 23   Height:      Weight:      SpO2: 99% 97% 96% 98%   General appearance: alert, cooperative and no distress Head: Normocephalic, without obvious abnormality, atraumatic Eyes: negative Nose: Nares normal. Septum midline. Mucosa normal. No drainage or sinus tenderness. Neck: no JVD and supple, symmetrical, trachea midline Lungs: clear to auscultation bilaterally Heart: regular rate and rhythm, S1, S2 normal, no murmur, click, rub or gallop Abdomen: soft, non-tender; bowel sounds normal; no masses,  no organomegaly Extremities: extremities normal, atraumatic, no cyanosis or edema Pulses: 2+ and symmetric Skin: Skin color, texture, turgor normal. No rashes or lesions Neurologic: Grossly normal    Labs on Admission:   Recent Labs  04/29/15 2245  NA 137  K 3.5  CL 101  CO2 27  GLUCOSE 95  BUN 11  CREATININE 1.12  CALCIUM 8.8*    Recent Labs  04/29/15 2245  AST 20  ALT 19  ALKPHOS 46  BILITOT 0.8  PROT 7.7  ALBUMIN 3.7    Recent Labs  04/29/15 2245  LIPASE 20*    Recent Labs  04/29/15 2245  WBC 9.7  NEUTROABS 5.8  HGB 13.2  HCT 39.2  MCV 96.3  PLT 217    Recent Labs  04/29/15 2245 04/30/15 0015 04/30/15 0315  TROPONINI <0.03 <0.03 <0.03    Radiological Exams on Admission: Dg Chest 2 View  04/30/2015   CLINICAL DATA:  Patient was seen in the ED earlier tonight due to weakness. As patient was leaving the ED, he began experiencing severe shortness of breath.  EXAM: CHEST  2 VIEW  COMPARISON:  04/24/2015   FINDINGS: Shallow inspiration. Mild cardiac enlargement. Central vascular congestion. No consolidation or edema. No blunting of costophrenic angles. No pneumothorax. Mediastinal contours are intact. Degenerative changes in the spine. No significant change since prior study.  IMPRESSION: Cardiac enlargement with mild pulmonary vascular congestion. Similar prior study. No consolidation or edema.   Electronically Signed   By: Burman Nieves M.D.   On: 04/30/2015 01:57   Ct Head Wo Contrast  04/30/2015   EXAM: CT HEAD WITHOUT CONTRAST  TECHNIQUE: Contiguous axial images were obtained from the base of the skull through the vertex without intravenous contrast.  COMPARISON:  Head CT dated 06/18/2011  FINDINGS: The ventricles and the sulci are appropriate in size for the patient's age. There is no intracranial hemorrhage. No midline shift or mass effect identified. The gray-white matter differentiation is preserved.  There is mild mucoperiosteal thickening of the frontal sinuses with partial opacification of the ethmoid air cells. The maxillary, sphenoid sinuses are well aerated. The mastoid air cells are clear. Left maxillary bone fixation sutures noted. There is irregularity of the nasal bone, likely related to old trauma. The calvarium is intact. Small high-density focus is noted in the subcutaneous soft tissues of the left forehead similar to the prior study.  IMPRESSION: No acute intracranial pathology.   Electronically Signed   By: Elgie Collard M.D.   On: 04/30/2015 04:02   Case discussed with dr ward ed doctor Old record reviewed cxr reviwed no edema or infiltrate ekg reviewed nsr no acute issues  Assessment/Plan  59 yo male with orthostatic hypotension/syncope  Principal Problem:   Orthostatic syncope-  Hold all cardiac medications.  Obtain records from [facility] from hospitaliztion last week.  obs on tele.  Give 500cc  ivf bolus then 75cc hour of ns, check orthostatics per shift.    Active  Problems:   OBESITY, MORBID- noted   ANXIETY DISORDER, GENERALIZED- cont xanax   Essential hypertension, benign-  Hold bp meds   Coronary atherosclerosis of native coronary artery- hold bp meds   Chronic back pain-  Cont po percocet tid   History of cocaine abus- denies use in 14 years, used in the past because of a pretty lady convinced him too try it   History of alcohol abuse- denies   Chest pain-  Chronic,  Cont ranexa   Syncope- as above  obs on tele.  Full code.  Gregorio Worley A 04/30/2015, 4:42 AM

## 2015-04-30 NOTE — Progress Notes (Addendum)
Patient left AMA after risks discussed. IV's removed. Dr Kerry Hough notified.

## 2015-04-30 NOTE — ED Provider Notes (Addendum)
This chart was scribed for [name] Maw Alysen Smylie, DO by Budd Palmer, ED Scribe. This patient was seen in room APA04/APA04 and the patient's care was started at 1:02 AM.   TIME SEEN: 1:02 AM   CHIEF COMPLAINT: SOB   HPI: [name] is a 59 y.o. male with a PMHx of COPD, HLD, CAD, and CHF who presents to the Emergency Department complaining of SOB onset 3 weeks ago. He reports associated CP, diaphoresis, diarrhea, weakness, and myalgias. No nausea or vomiting. No fever or cough. He was seen a 3.5  hours ago in the ED for weakness. Left because he did not receive pain medication. He notes he recently lost his wife to cancer 3 weeks ago. He states he does not have oxygen at home. He denies recent steroid treatments. Pt denies SI or HI. Reports the chest pain has been constant all day long. Shortness of breath is also been chronic for the past 3 weeks. Unable to tolerate their any aggravating or relieving factors. Was admitted to [facility] 2 days ago for chest pain. He does take Percocet and Xanax at home. He has a history of alcohol and cocaine abuse.  ROS: See HPI Constitutional: no fever  Eyes: no drainage  ENT: no runny nose   Cardiovascular: chest pain  Resp: SOB  GI: no vomiting GU: no dysuria Integumentary: no rash  Allergy: no hives  Musculoskeletal: no leg swelling  Neurological: no slurred speech ROS otherwise negative  PAST MEDICAL HISTORY/PAST SURGICAL HISTORY:  Past Medical History  Diagnosis Date  . COPD (chronic obstructive pulmonary disease)   . Essential hypertension, benign   . Chronic back pain   . Anxiety disorder   . Hyperlipidemia   . Depression   . Obesity   . History of alcohol abuse   . History of cocaine abuse   . Pneumonia   . GERD (gastroesophageal reflux disease)   . Headache(784.0)   . Arthritis   . CAD (coronary artery disease)     a. ~ 2000 cath at [facility];  b. 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 08/2013 Cath: LM anomalous takeoff from R cor  cusp, LAD 30 ost/p/m, LCX 30p, 19m, OM3 50ost, RI 30p, RCA 40ost, RPL 70d branch, EF 50-55%->Med Rx.  . CHF (congestive heart failure)     MEDICATIONS:  Prior to Admission medications   Medication Sig Start Date End Date Taking? Authorizing Provider  ALPRAZolam Prudy Feeler) 1 MG tablet Take 1 tablet (1 mg total) by mouth 3 (three) times daily as needed for anxiety. For anxiety 05/15/12   Kathlen Mody, MD  amLODipine (NORVASC) 5 MG tablet Take 5 mg by mouth daily. 08/06/14   Historical Provider, MD  aspirin 81 MG tablet Take 1 tablet (81 mg total) by mouth daily. 05/31/12   Kathlen Mody, MD  atorvastatin (LIPITOR) 10 MG tablet Take 1 tablet (10 mg total) by mouth daily. 01/07/15   Jonelle Sidle, MD  budesonide-formoterol [facility]) 160-4.5 MCG/ACT inhaler Inhale 2 puffs into the lungs 2 (two) times daily.    Historical Provider, MD  citalopram (CELEXA) 40 MG tablet Take 40 mg by mouth daily.      Historical Provider, MD  cyclobenzaprine (FLEXERIL) 5 MG tablet Take 1 tablet (5 mg total) by mouth 3 (three) times daily as needed for muscle spasms. 10/20/14   Erick Blinks, MD  doxepin (SINEQUAN) 25 MG capsule Take 25 mg by mouth at bedtime.  08/06/14   Historical Provider, MD  furosemide (  LASIX) 40 MG tablet Take 1 tablet (40 mg total) by mouth daily. 12/06/13   Catarina Hartshorn, MD  hydrochlorothiazide (HYDRODIURIL) 25 MG tablet Take 25 mg by mouth daily. 08/06/14   Historical Provider, MD  ipratropium-albuterol (DUONEB) 0.5-2.5 (3) MG/3ML SOLN Take 3 mLs by nebulization every 6 (six) hours as needed (shortness of breath/ wheezing.).     Historical Provider, MD  isosorbide mononitrate (IMDUR) 30 MG 24 hr tablet Take 1 tablet (30 mg total) by mouth 2 (two) times daily. Patient taking differently: Take 30 mg by mouth daily.  08/12/14   Nishant Dhungel, MD  meloxicam (MOBIC) 15 MG tablet Take 15 mg by mouth daily.  08/13/13   Historical Provider, MD  nitroGLYCERIN (NITROSTAT) 0.4 MG SL tablet Place 0.4 mg under  the tongue every 5 (five) minutes as needed. For chest pain    Historical Provider, MD  omeprazole (PRILOSEC) 20 MG capsule Take 20 mg by mouth daily.    Historical Provider, MD  oxyCODONE-acetaminophen (PERCOCET) 10-325 MG per tablet Take 1 tablet by mouth every 8 (eight) hours as needed for pain.  09/30/14   Historical Provider, MD  potassium chloride SA (K-DUR,KLOR-CON) 20 MEQ tablet Take 20 mEq by mouth 2 (two) times daily.  08/13/13   Historical Provider, MD  PROAIR HFA 108 (90 BASE) MCG/ACT inhaler Inhale 2 puffs into the lungs daily as needed for wheezing or shortness of breath.  06/20/13   Historical Provider, MD  ranolazine (RANEXA) 500 MG 12 hr tablet Take 1 tablet (500 mg total) by mouth 2 (two) times daily. 08/12/14   Nishant Dhungel, MD  SPIRIVA HANDIHALER 18 MCG inhalation capsule  03/17/15   Historical Provider, MD  traZODone (DESYREL) 100 MG tablet Take 100 mg by mouth at bedtime.     Historical Provider, MD  VOLTAREN 1 % GEL  02/27/15   Historical Provider, MD    ALLERGIES:  Allergies  Allergen Reactions  . Ativan [Lorazepam] Other (See Comments)    "out of mind"  . Dilaudid [Hydromorphone Hcl] Other (See Comments)    " out of mind"  . Haloperidol Decanoate     SOCIAL HISTORY:  Social History  Substance Use Topics  . Smoking status: Current Every Day Smoker -- 0.25 packs/day for 39 years  . Smokeless tobacco: Never Used  . Alcohol Use: No     Comment: denies    FAMILY HISTORY: Family History  Problem Relation Age of Onset  . Heart attack Mother   . Heart attack Brother   . Heart attack Sister     EXAM: BP 124/67 mmHg  Pulse 79  Temp(Src) 97.7 F (36.5 C)  Resp 22  Ht 6\' 3"  (1.905 m)  Wt 354 lb (160.573 kg)  BMI 44.25 kg/m2  SpO2 98% CONSTITUTIONAL: Alert and oriented and responds appropriately to questions. In no apparent distress, no respiratory distress, obese HEAD: Normocephalic EYES: Conjunctivae clear, PERRL ENT: normal nose; no rhinorrhea; moist  mucous membranes; pharynx without lesions noted NECK: Supple, no meningismus, no LAD  CARD: RRR; S1 and S2 appreciated; no murmurs, no clicks, no rubs, no gallops RESP: Normal chest excursion without splinting or tachypnea; breath sounds equal bilaterally; diffuse expiratory wheezes, no rhonchi, no rales, no hypoxia or respiratory distress, speaking full sentences ABD/GI: Normal bowel sounds; non-distended; soft, non-tender, no rebound, no guarding, no peritoneal signs BACK:  The back appears normal and is non-tender to palpation, there is no CVA tenderness EXT: Normal ROM in all joints; non-tender to palpation; no  edema; normal capillary refill; no cyanosis, no calf tenderness or swelling    SKIN: Normal color for age and race; warm NEURO: Moves all extremities equally, sensation to light touch intact diffusely, cranial nerves II through XII intact PSYCH: The patient's mood and manner are appropriate. Grooming and personal hygiene are appropriate.  MEDICAL DECISION MAKING: Patient here with complaints of chest pain and shortness of breath. Sugars of breath has been chronic for the past 3 weeks. He is not hypoxic on room air and does not appear to be in any respiratory distress. Speaking full sentences. He was just admitted to [facility] 2 days ago for chest pain. He is followed by Dr. Wyline Mood. Here earlier tonight for generalized weakness. Left because he was not receiving narcotic pain medication.  Has asked repeatedly for pain medication in the emergency department. We'll give Toradol for his generalized body aches. He had normal CBC, BMP, lactate earlier today. Also had a negative troponin. We'll repeat troponin, obtain BNP and chest x-ray. He does have some wheezing on exam. Will give albuterol, Solu-Medrol. We'll ambulate with pulse ox to see if patient becomes symptomatic or hypoxic.  ED PROGRESS: Patient's labs unremarkable including negative troponin and normal BNP. We'll repeat a third  troponin at 3:15 AM. Chest x-ray is clear Anticipate if this is negative and his wheezing has improved and he does not become hypoxic with ambulation that he can be discharged home with outpatient follow up with his cardiologist.   2:30 AM Pt upset because we gave him "kid's medication" for his pain. Is requesting Dilaudid. This medication is listed as an allergy for him. Have discussed with patient that I do not feel narcotics are indicated at this time.  His breath sounds have improved with decreased wheezing and better aeration.   3:15 AM  Called into patient's room.  Respiratory therapy with pt starting his next breathing treatment and patient became unresponsive, apneic for ~ 30 seconds. No hypoxia during this episode.  No arrhythmia noted on monitor, he appeared to stay normal sinus rhythm. Respiratory therapy state they feel that this was a real syncopal event. They state patient only came to after several seconds of vigorous sternal rub. No seizure-like activity. No incontinence or tongue biting. Patient seems slightly confused but is oriented to person and place but not year. Denies any pain - denies chest pain currently, headache. Moving all extremities equally. Repeat EKG shows no significant change. No arrhythmia. He has a borderline prolonged QTC. Blood glucose is 114. Given syncopal event with episode of apnea, will admit to medicine given persistent symptoms of intermittent chest pain with history of CAD, syncopal event in the setting of CHF, CAD, and COPD exacerbation.  3:30 AM  D/w Dr. Onalee Hua with hospitalist service. She would like a head CT. If this is negative, will admit to telemetry, observation.   4:05 AM  Pt back to his baseline. CT head shows no acute abnormality. We'll admit to telemetry, observation.   EKG Interpretation  Date/Time:  Wednesday April 30 2015 01:21:00 EDT Ventricular Rate:  74 PR Interval:  203 QRS Duration: 102 QT Interval:  428 QTC  Calculation: 475 R Axis:   10 Text Interpretation:  Sinus rhythm Borderline prolonged PR interval No significant change since last tracing Confirmed by Callee Rohrig,  DO, Pretty Weltman [phone]) on 04/30/2015 1:45:21 AM       EKG Interpretation  Date/Time:  Wednesday April 30 2015 03:19:36 EDT Ventricular Rate:  79 PR Interval:  194 QRS Duration:  109 QT Interval:  427 QTC Calculation: 489 R Axis:   18 Text Interpretation:  Sinus rhythm Borderline prolonged QT interval Confirmed by Kelilah Hebard,  DO, Elisabel Hanover ([id number]) on 04/30/2015 3:21:41 AM        I personally performed the services described in this documentation, which was scribed in my presence. The recorded information has been reviewed and is accurate.   [name] Maw Clark Clowdus, DO 04/30/15 0405  [name] Maw Kymberley Raz, DO 04/30/15 0421     4:30 AM  Patient is orthostatic. Blood pressure is 80s/50s with sitting upright. Likely cause of his syncopal event. Dr. Onalee Hua to order IVF bolus.  Doubt pulmonary embolus. Has had a negative CT angio last in December 2015.  [name] Maw Timathy Newberry, DO 04/30/15 [phone]

## 2015-04-30 NOTE — ED Notes (Signed)
Repeat EKG done and given to EDP

## 2015-04-30 NOTE — ED Notes (Signed)
cbg of 114. 

## 2015-07-11 ENCOUNTER — Inpatient Hospital Stay
Admission: AD | Admit: 2015-07-11 | Payer: Self-pay | Source: Other Acute Inpatient Hospital | Admitting: Internal Medicine

## 2015-07-11 ENCOUNTER — Encounter (HOSPITAL_COMMUNITY): Payer: Self-pay

## 2015-07-11 ENCOUNTER — Inpatient Hospital Stay (HOSPITAL_COMMUNITY)
Admission: EM | Admit: 2015-07-11 | Discharge: 2015-07-14 | DRG: 281 | Discharge status: Against Medical Advice | Payer: Medicare HMO | Attending: Cardiovascular Disease | Admitting: Cardiovascular Disease

## 2015-07-11 ENCOUNTER — Emergency Department (HOSPITAL_COMMUNITY): Payer: Medicare HMO

## 2015-07-11 DIAGNOSIS — I2510 Atherosclerotic heart disease of native coronary artery without angina pectoris: Secondary | ICD-10-CM

## 2015-07-11 DIAGNOSIS — Z7982 Long term (current) use of aspirin: Secondary | ICD-10-CM

## 2015-07-11 DIAGNOSIS — G8929 Other chronic pain: Secondary | ICD-10-CM | POA: Diagnosis present

## 2015-07-11 DIAGNOSIS — M549 Dorsalgia, unspecified: Secondary | ICD-10-CM | POA: Diagnosis present

## 2015-07-11 DIAGNOSIS — I509 Heart failure, unspecified: Secondary | ICD-10-CM | POA: Diagnosis present

## 2015-07-11 DIAGNOSIS — E876 Hypokalemia: Secondary | ICD-10-CM | POA: Diagnosis present

## 2015-07-11 DIAGNOSIS — Z888 Allergy status to other drugs, medicaments and biological substances status: Secondary | ICD-10-CM

## 2015-07-11 DIAGNOSIS — R739 Hyperglycemia, unspecified: Secondary | ICD-10-CM | POA: Diagnosis present

## 2015-07-11 DIAGNOSIS — E785 Hyperlipidemia, unspecified: Secondary | ICD-10-CM | POA: Diagnosis present

## 2015-07-11 DIAGNOSIS — Z885 Allergy status to narcotic agent status: Secondary | ICD-10-CM | POA: Diagnosis not present

## 2015-07-11 DIAGNOSIS — I10 Essential (primary) hypertension: Secondary | ICD-10-CM | POA: Diagnosis present

## 2015-07-11 DIAGNOSIS — F419 Anxiety disorder, unspecified: Secondary | ICD-10-CM | POA: Diagnosis present

## 2015-07-11 DIAGNOSIS — K219 Gastro-esophageal reflux disease without esophagitis: Secondary | ICD-10-CM | POA: Diagnosis present

## 2015-07-11 DIAGNOSIS — F329 Major depressive disorder, single episode, unspecified: Secondary | ICD-10-CM | POA: Diagnosis present

## 2015-07-11 DIAGNOSIS — I214 Non-ST elevation (NSTEMI) myocardial infarction: Secondary | ICD-10-CM | POA: Diagnosis present

## 2015-07-11 DIAGNOSIS — Z96642 Presence of left artificial hip joint: Secondary | ICD-10-CM | POA: Diagnosis present

## 2015-07-11 DIAGNOSIS — F17200 Nicotine dependence, unspecified, uncomplicated: Secondary | ICD-10-CM | POA: Diagnosis present

## 2015-07-11 DIAGNOSIS — I951 Orthostatic hypotension: Secondary | ICD-10-CM | POA: Diagnosis present

## 2015-07-11 DIAGNOSIS — J441 Chronic obstructive pulmonary disease with (acute) exacerbation: Secondary | ICD-10-CM | POA: Diagnosis present

## 2015-07-11 DIAGNOSIS — Z79899 Other long term (current) drug therapy: Secondary | ICD-10-CM

## 2015-07-11 DIAGNOSIS — M1990 Unspecified osteoarthritis, unspecified site: Secondary | ICD-10-CM | POA: Diagnosis present

## 2015-07-11 DIAGNOSIS — Z6841 Body Mass Index (BMI) 40.0 and over, adult: Secondary | ICD-10-CM | POA: Diagnosis not present

## 2015-07-11 DIAGNOSIS — G894 Chronic pain syndrome: Secondary | ICD-10-CM | POA: Diagnosis present

## 2015-07-11 DIAGNOSIS — Z7951 Long term (current) use of inhaled steroids: Secondary | ICD-10-CM | POA: Diagnosis not present

## 2015-07-11 HISTORY — DX: Pain in unspecified hip: M25.559

## 2015-07-11 HISTORY — DX: Other chronic pain: G89.29

## 2015-07-11 LAB — CBC WITH DIFFERENTIAL/PLATELET
BASOS ABS: 0.1 10*3/uL (ref 0.0–0.1)
BASOS PCT: 1 %
Eosinophils Absolute: 0.4 10*3/uL (ref 0.0–0.7)
Eosinophils Relative: 4 %
HEMATOCRIT: 42.8 % (ref 39.0–52.0)
HEMOGLOBIN: 14.5 g/dL (ref 13.0–17.0)
LYMPHS PCT: 39 %
Lymphs Abs: 3.6 10*3/uL (ref 0.7–4.0)
MCH: 32.9 pg (ref 26.0–34.0)
MCHC: 33.9 g/dL (ref 30.0–36.0)
MCV: 97.1 fL (ref 78.0–100.0)
MONO ABS: 0.9 10*3/uL (ref 0.1–1.0)
Monocytes Relative: 10 %
NEUTROS ABS: 4.2 10*3/uL (ref 1.7–7.7)
NEUTROS PCT: 46 %
Platelets: 239 10*3/uL (ref 150–400)
RBC: 4.41 MIL/uL (ref 4.22–5.81)
RDW: 13.1 % (ref 11.5–15.5)
WBC: 9.2 10*3/uL (ref 4.0–10.5)

## 2015-07-11 LAB — BASIC METABOLIC PANEL
ANION GAP: 7 (ref 5–15)
BUN: 16 mg/dL (ref 6–20)
CALCIUM: 8.9 mg/dL (ref 8.9–10.3)
CHLORIDE: 108 mmol/L (ref 101–111)
CO2: 27 mmol/L (ref 22–32)
Creatinine, Ser: 1.22 mg/dL (ref 0.61–1.24)
GFR calc non Af Amer: >60 mL/min (ref >60)
GLUCOSE: 122 mg/dL — AB (ref 65–99)
POTASSIUM: 3.2 mmol/L — AB (ref 3.5–5.1)
Sodium: 142 mmol/L (ref 135–145)

## 2015-07-11 LAB — RAPID URINE DRUG SCREEN, HOSP PERFORMED
Amphetamines: NOT DETECTED
BARBITURATES: NOT DETECTED
BENZODIAZEPINES: NOT DETECTED
COCAINE: POSITIVE — AB
Opiates: NOT DETECTED
TETRAHYDROCANNABINOL: NOT DETECTED

## 2015-07-11 LAB — BRAIN NATRIURETIC PEPTIDE: B Natriuretic Peptide: 114 pg/mL — ABNORMAL HIGH (ref 0.0–100.0)

## 2015-07-11 LAB — PROTIME-INR
INR: 1.04 (ref 0.00–1.49)
PROTHROMBIN TIME: 13.8 s (ref 11.6–15.2)

## 2015-07-11 LAB — TROPONIN I: Troponin I: 0.14 ng/mL — ABNORMAL HIGH (ref <0.031)

## 2015-07-11 MED ORDER — PANTOPRAZOLE SODIUM 40 MG PO TBEC
40.0000 mg | DELAYED_RELEASE_TABLET | Freq: Every day | ORAL | Status: DC
  Administered 2015-07-12 – 2015-07-14 (×3): 40 mg via ORAL
  Filled 2015-07-11 (×4): qty 1

## 2015-07-11 MED ORDER — PREDNISONE 20 MG PO TABS
40.0000 mg | ORAL_TABLET | Freq: Every day | ORAL | Status: DC
  Administered 2015-07-13 – 2015-07-14 (×2): 40 mg via ORAL
  Filled 2015-07-11 (×2): qty 2

## 2015-07-11 MED ORDER — CITALOPRAM HYDROBROMIDE 10 MG PO TABS
10.0000 mg | ORAL_TABLET | Freq: Every morning | ORAL | Status: DC
  Administered 2015-07-12 – 2015-07-14 (×3): 10 mg via ORAL
  Filled 2015-07-11 (×3): qty 1

## 2015-07-11 MED ORDER — ASPIRIN EC 81 MG PO TBEC
81.0000 mg | DELAYED_RELEASE_TABLET | Freq: Every day | ORAL | Status: DC
  Administered 2015-07-12 – 2015-07-14 (×3): 81 mg via ORAL
  Filled 2015-07-11 (×3): qty 1

## 2015-07-11 MED ORDER — ONDANSETRON HCL 4 MG/2ML IJ SOLN: 4.0000 mg | Freq: Four times a day (QID) | INTRAMUSCULAR | Status: DC | PRN

## 2015-07-11 MED ORDER — ISOSORBIDE MONONITRATE ER 30 MG PO TB24: 30.0000 mg | ORAL_TABLET | Freq: Every morning | ORAL | Status: DC

## 2015-07-11 MED ORDER — DICLOFENAC SODIUM 1 % TD GEL: 2.0000 g | Freq: Every day | TRANSDERMAL | Status: DC | PRN

## 2015-07-11 MED ORDER — ASPIRIN 300 MG RE SUPP: 300.0000 mg | RECTAL | Status: AC

## 2015-07-11 MED ORDER — METOPROLOL TARTRATE 12.5 MG HALF TABLET
12.5000 mg | ORAL_TABLET | Freq: Two times a day (BID) | ORAL | Status: DC
  Administered 2015-07-11 – 2015-07-14 (×6): 12.5 mg via ORAL
  Filled 2015-07-11 (×6): qty 1

## 2015-07-11 MED ORDER — TIOTROPIUM BROMIDE MONOHYDRATE 18 MCG IN CAPS
18.0000 ug | ORAL_CAPSULE | Freq: Every day | RESPIRATORY_TRACT | Status: DC
  Filled 2015-07-11: qty 5

## 2015-07-11 MED ORDER — POTASSIUM CHLORIDE CRYS ER 20 MEQ PO TBCR
20.0000 meq | EXTENDED_RELEASE_TABLET | Freq: Every morning | ORAL | Status: DC
  Administered 2015-07-12 – 2015-07-14 (×3): 20 meq via ORAL
  Filled 2015-07-11 (×3): qty 1

## 2015-07-11 MED ORDER — ACETAMINOPHEN 325 MG PO TABS
650.0000 mg | ORAL_TABLET | ORAL | Status: DC | PRN
  Administered 2015-07-14: 650 mg via ORAL
  Filled 2015-07-11: qty 2

## 2015-07-11 MED ORDER — HEPARIN (PORCINE) IN NACL 100-0.45 UNIT/ML-% IJ SOLN
1850.0000 [IU]/h | INTRAMUSCULAR | Status: DC
  Administered 2015-07-11 – 2015-07-12 (×2): 1600 [IU]/h via INTRAVENOUS
  Administered 2015-07-12: 1850 [IU]/h via INTRAVENOUS
  Filled 2015-07-11 (×3): qty 250

## 2015-07-11 MED ORDER — OXYCODONE-ACETAMINOPHEN 5-325 MG PO TABS
1.0000 | ORAL_TABLET | Freq: Three times a day (TID) | ORAL | Status: DC
  Administered 2015-07-11: 1 via ORAL
  Filled 2015-07-11: qty 1

## 2015-07-11 MED ORDER — ASPIRIN 81 MG PO CHEW
324.0000 mg | CHEWABLE_TABLET | Freq: Once | ORAL | Status: AC
  Administered 2015-07-11: 324 mg via ORAL
  Filled 2015-07-11: qty 4

## 2015-07-11 MED ORDER — DOXEPIN HCL 25 MG PO CAPS
25.0000 mg | ORAL_CAPSULE | Freq: Every day | ORAL | Status: DC
  Administered 2015-07-11 – 2015-07-13 (×3): 25 mg via ORAL
  Filled 2015-07-11 (×4): qty 1

## 2015-07-11 MED ORDER — SODIUM CHLORIDE 0.9 % IV SOLN
INTRAVENOUS | Status: DC
  Administered 2015-07-11: 100 mL/h via INTRAVENOUS

## 2015-07-11 MED ORDER — AMLODIPINE BESYLATE 10 MG PO TABS
10.0000 mg | ORAL_TABLET | Freq: Every day | ORAL | Status: DC
  Administered 2015-07-12 – 2015-07-14 (×3): 10 mg via ORAL
  Filled 2015-07-11 (×3): qty 1

## 2015-07-11 MED ORDER — ATORVASTATIN CALCIUM 10 MG PO TABS
10.0000 mg | ORAL_TABLET | Freq: Every day | ORAL | Status: DC
  Administered 2015-07-11 – 2015-07-13 (×3): 10 mg via ORAL
  Filled 2015-07-11 (×3): qty 1

## 2015-07-11 MED ORDER — MORPHINE SULFATE (PF) 4 MG/ML IV SOLN
4.0000 mg | INTRAVENOUS | Status: AC | PRN
  Administered 2015-07-11 (×2): 4 mg via INTRAVENOUS
  Filled 2015-07-11 (×2): qty 1

## 2015-07-11 MED ORDER — NITROGLYCERIN IN D5W 200-5 MCG/ML-% IV SOLN
5.0000 ug/min | Freq: Once | INTRAVENOUS | Status: AC
  Administered 2015-07-11: 5 ug/min via INTRAVENOUS
  Filled 2015-07-11: qty 250

## 2015-07-11 MED ORDER — BUDESONIDE-FORMOTEROL FUMARATE 160-4.5 MCG/ACT IN AERO
2.0000 | INHALATION_SPRAY | Freq: Two times a day (BID) | RESPIRATORY_TRACT | Status: DC
  Administered 2015-07-12 – 2015-07-14 (×5): 2 via RESPIRATORY_TRACT
  Filled 2015-07-11: qty 6

## 2015-07-11 MED ORDER — OXYCODONE-ACETAMINOPHEN 10-325 MG PO TABS: 1.0000 | ORAL_TABLET | Freq: Three times a day (TID) | ORAL | Status: DC

## 2015-07-11 MED ORDER — FUROSEMIDE 40 MG PO TABS
40.0000 mg | ORAL_TABLET | Freq: Every morning | ORAL | Status: DC
  Administered 2015-07-12 – 2015-07-14 (×3): 40 mg via ORAL
  Filled 2015-07-11 (×3): qty 1

## 2015-07-11 MED ORDER — MORPHINE SULFATE (PF) 2 MG/ML IV SOLN
2.0000 mg | INTRAVENOUS | Status: AC | PRN
  Administered 2015-07-11 – 2015-07-12 (×2): 2 mg via INTRAVENOUS
  Filled 2015-07-11 (×2): qty 1

## 2015-07-11 MED ORDER — IPRATROPIUM-ALBUTEROL 0.5-2.5 (3) MG/3ML IN SOLN: 3.0000 mL | Freq: Four times a day (QID) | RESPIRATORY_TRACT | Status: DC | PRN

## 2015-07-11 MED ORDER — RANOLAZINE ER 500 MG PO TB12
500.0000 mg | ORAL_TABLET | Freq: Two times a day (BID) | ORAL | Status: DC
Start: 2015-07-11 — End: 2015-07-14
  Administered 2015-07-11 – 2015-07-14 (×6): 500 mg via ORAL
  Filled 2015-07-11 (×6): qty 1

## 2015-07-11 MED ORDER — NITROGLYCERIN 0.4 MG SL SUBL: 0.4000 mg | SUBLINGUAL_TABLET | SUBLINGUAL | Status: DC | PRN

## 2015-07-11 MED ORDER — POTASSIUM CHLORIDE ER 10 MEQ PO TBCR
40.0000 meq | EXTENDED_RELEASE_TABLET | Freq: Once | ORAL | Status: AC
  Administered 2015-07-11: 40 meq via ORAL
  Filled 2015-07-11 (×2): qty 4

## 2015-07-11 MED ORDER — ALBUTEROL SULFATE (2.5 MG/3ML) 0.083% IN NEBU: 3.0000 mL | INHALATION_SOLUTION | Freq: Every day | RESPIRATORY_TRACT | Status: DC | PRN

## 2015-07-11 MED ORDER — METHYLPREDNISOLONE SODIUM SUCC 125 MG IJ SOLR
60.0000 mg | Freq: Four times a day (QID) | INTRAMUSCULAR | Status: AC
  Administered 2015-07-11 – 2015-07-12 (×4): 60 mg via INTRAVENOUS
  Filled 2015-07-11 (×4): qty 2

## 2015-07-11 MED ORDER — ASPIRIN 81 MG PO CHEW: 324.0000 mg | CHEWABLE_TABLET | ORAL | Status: AC

## 2015-07-11 MED ORDER — OXYCODONE HCL 5 MG PO TABS
5.0000 mg | ORAL_TABLET | Freq: Three times a day (TID) | ORAL | Status: DC
  Administered 2015-07-11: 5 mg via ORAL
  Filled 2015-07-11: qty 1

## 2015-07-11 MED ORDER — ALPRAZOLAM 0.5 MG PO TABS
1.0000 mg | ORAL_TABLET | Freq: Three times a day (TID) | ORAL | Status: DC | PRN
  Administered 2015-07-11 – 2015-07-14 (×8): 1 mg via ORAL
  Filled 2015-07-11 (×9): qty 2

## 2015-07-11 MED ORDER — HYDROCHLOROTHIAZIDE 25 MG PO TABS
25.0000 mg | ORAL_TABLET | Freq: Every morning | ORAL | Status: DC
  Administered 2015-07-12 – 2015-07-14 (×3): 25 mg via ORAL
  Filled 2015-07-11 (×3): qty 1

## 2015-07-11 MED ORDER — HEPARIN BOLUS VIA INFUSION
4000.0000 [IU] | Freq: Once | INTRAVENOUS | Status: AC
  Administered 2015-07-11: 4000 [IU] via INTRAVENOUS

## 2015-07-11 NOTE — ED Notes (Signed)
[facility] in room with pt.

## 2015-07-11 NOTE — ED Notes (Signed)
nitro increased to as pain has increased.

## 2015-07-11 NOTE — ED Notes (Signed)
2l O2 placed on pt

## 2015-07-11 NOTE — H&P (Signed)
Patient ID: [name] MRN: [id number], DOB/AGE: June 22, 1956   Admit date: 07/11/2015   Primary Physician: [name], MD Primary Cardiologist: Dr. Diona Browner  Pt. Profile:  [name] is a 59 y.o. male with a history of COPD, continued tobacco abuse, alcohol abuse/previous cocaine use, HTN, HLD, obesity, GERD, orthostatic hypotension, chronic chest pain on Ranexa and non obstructive CAD who is transferred from [facility] to [facility] tonight for chest pain and positive cardiac enzymes (troponin 0.14). He started having chest pain yesterday and presented to [facility], where he was reportedly told that everything was OK.  Today he continued to have chest pain that was worse with exertion.  There was associated shortness of breath and lower extremity edema that began today.  He has been unable to walk across the room without dyspnea.  He denies orthopnea or PND.  He denies fever but has a cough productive of phleghm.  He attributed this to decreasing his smoking from a pack per day to one cigarette per day.  He then presented to [facility] today with chest pain partially responsive to SL NTG and his cardiac enzymes were noted to be elevated. Per nursing notes patient was upset about recent loss of his wife 2 months ago. He was started on IV heparin and sent to [facility] for further evaluation and treatment. At this time continues to complain of chest discomfort. He states that the pain is only responsive to IV narcotics.  He underwent LHC ~ 2000 at [facility]. 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 Medical therapy was recommended.  He presented to the ER with chest pain in 08/2014 and ruled out for MI. Patient signed out AMA. He was in the hospital again on 10/20/14 with atypical chest pain and  given Flexeril for musculoskeletal pain. Patient also has a history of syncope with bradycardia on telemetry so was taken off beta blocker. He again presented on the ED in 10/2014. Troponin negative but left AMA before cardiology could see him.  Last seen in the office by Dr. Diona Browner in 11/2014: Doing okay from a cardiac stand point. Continued on medical therapy.  He was hospitalized at [facility] sometime in September ( no records ) for syncope and his lasix was discontinued.   He was then seen in the [facility] ED 04/29/15 for chest pain/SOB--> left AMA. Returned to the ED the following day 04/30/15 for sob and chest pain. He then had a syncopal spell in the lobby where "he passed out and became apneic but remained with a pulse and woke up in less than a minute or so". He was found to be orthostatic and referred for admission--> left AMA.    Problem List  Past Medical History  Diagnosis Date  . COPD (chronic obstructive pulmonary disease) (HCC)   . Essential hypertension, benign   . Chronic back pain   . Anxiety disorder   . Hyperlipidemia   . Depression   . Obesity   . History of alcohol abuse   . History of cocaine abuse   . Pneumonia   . GERD (gastroesophageal reflux disease)   . Headache(784.0)   . Arthritis   . CAD (coronary artery disease)     a. ~ 2000 cath at [facility];  b.  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  . CHF (congestive heart failure) (HCC)   . Chronic hip pain     Past Surgical History  Procedure Laterality Date  . Hip surgery      rt  . Cholecystectomy    . Back surgery    . Nasal/sinus endoscopy    . Total hip arthroplasty  05/08/2012    Procedure: TOTAL HIP ARTHROPLASTY;  Surgeon: Nestor Lewandowsky, MD;  Location: [facility] OR;  Service: Orthopedics;  Laterality: Left;  . Joint replacement    . Left heart catheterization with coronary  angiogram N/A 09/12/2013    Procedure: LEFT HEART CATHETERIZATION WITH CORONARY ANGIOGRAM;  Surgeon: Kathleene Hazel, MD;  Location: [facility] CATH LAB;  Service: Cardiovascular;  Laterality: N/A;  . Total hip arthroplasty  2014 per pt     Allergies  Allergies  Allergen Reactions  . Ativan [Lorazepam] Other (See Comments)    "out of mind"  . Dilaudid [Hydromorphone Hcl] Other (See Comments)    " out of mind"  . Haloperidol Decanoate      Home Medications  Prior to Admission medications   Medication Sig Start Date End Date Taking? Authorizing Provider  ALPRAZolam Prudy Feeler) 1 MG tablet Take 1 tablet (1 mg total) by mouth 3 (three) times daily as needed for anxiety. For anxiety 05/15/12   Kathlen Mody, MD  amLODipine (NORVASC) 5 MG tablet Take 10 mg by mouth daily.  08/06/14   Historical Provider, MD  aspirin 81 MG tablet Take 1 tablet (81 mg total) by mouth daily. 05/31/12   Kathlen Mody, MD  atorvastatin (LIPITOR) 10 MG tablet Take 1 tablet (10 mg total) by mouth daily. 01/07/15   Jonelle Sidle, MD  budesonide-formoterol [facility]) 160-4.5 MCG/ACT inhaler Inhale 2 puffs into the lungs 2 (two) times daily.    Historical Provider, MD  citalopram (CELEXA) 40 MG tablet Take 40 mg by mouth daily.      Historical Provider, MD  cyclobenzaprine (FLEXERIL) 5 MG tablet Take 1 tablet (5 mg total) by mouth 3 (three) times daily as needed for muscle spasms. 10/20/14   Erick Blinks, MD  doxepin (SINEQUAN) 25 MG capsule Take 25 mg by mouth at bedtime.  08/06/14   Historical Provider, MD  hydrochlorothiazide (HYDRODIURIL) 25 MG tablet Take 25 mg by mouth daily. 08/06/14   Historical Provider, MD  ipratropium-albuterol (DUONEB) 0.5-2.5 (3) MG/3ML SOLN Take 3 mLs by nebulization every 6 (six) hours as needed (shortness of breath/ wheezing.).     Historical Provider, MD  isosorbide mononitrate (IMDUR) 30 MG 24 hr tablet Take 1 tablet (30 mg total) by mouth 2 (two) times daily. Patient taking differently:  Take 30 mg by mouth daily.  08/12/14   Nishant Dhungel, MD  meloxicam (MOBIC) 15 MG tablet Take 15 mg by mouth daily.  08/13/13   Historical Provider, MD  nitroGLYCERIN (NITROSTAT) 0.4 MG SL tablet Place 0.4 mg under the tongue every 5 (five) minutes as needed. For chest pain    Historical Provider, MD  omeprazole (PRILOSEC) 20 MG capsule Take 20 mg by mouth daily.    Historical Provider, MD  oxyCODONE-acetaminophen (PERCOCET) 10-325 MG per tablet Take 1 tablet by mouth every 8 (eight) hours as needed for pain.  09/30/14   Historical Provider, MD  potassium chloride SA (K-DUR,KLOR-CON) 20 MEQ  tablet Take 20 mEq by mouth 2 (two) times daily.  08/13/13   Historical Provider, MD  PROAIR HFA 108 (90 BASE) MCG/ACT inhaler Inhale 2 puffs into the lungs daily as needed for wheezing or shortness of breath.  06/20/13   Historical Provider, MD  ranolazine (RANEXA) 500 MG 12 hr tablet Take 1 tablet (500 mg total) by mouth 2 (two) times daily. 08/12/14   Nishant Dhungel, MD  SPIRIVA HANDIHALER 18 MCG inhalation capsule  03/17/15   Historical Provider, MD  traZODone (DESYREL) 100 MG tablet Take 100 mg by mouth at bedtime.     Historical Provider, MD  VOLTAREN 1 % GEL Apply 2 g topically daily as needed (pain).  02/27/15   Historical Provider, MD    Family History  Family History  Problem Relation Age of Onset  . Heart attack Mother   . Heart attack Brother   . Heart attack Sister    No family status information on file.   Social History  Social History   Social History  . Marital Status: Married    Spouse Name: N/A  . Number of Children: N/A  . Years of Education: N/A   Occupational History  . UNEMPLOYED    Social History Main Topics  . Smoking status: Current Every Day Smoker -- 0.25 packs/day for 39 years  . Smokeless tobacco: Never Used  . Alcohol Use: No     Comment: denies  . Drug Use: No  . Sexual Activity: Not on file   Other Topics Concern  . Not on file   Social History  Narrative     Review of Systems: A 12 point review of systems was obtained and was negative with the exception of those things noted in the history of present illness.   Physical Exam  Blood pressure 127/78, pulse 74, temperature 97.3 F (36.3 C), temperature source Oral, resp. rate 16, height  (1.905 m), weight 327 lb (148.326 kg), SpO2 98 %.  General: Pleasant, NAD Psych: Normal affect. Neuro: Alert and oriented X 3. Moves all extremities spontaneously. HEENT: Normal  Neck: Supple without bruits or JVD. Lungs:  Mild respiratory distress. Diffuse expiratory wheezing and poor air movement throughout. No rhonchi or crackles. Heart: RRR no s3, s4, or murmurs. Abdomen: Soft, non-tender, non-distended, BS + x 4.  Extremities: No clubbing or cyanosis.  Trace edema. DP/PT/Radials 2+ and equal bilaterally.  Labs   Recent Labs  07/11/15 1728  TROPONINI 0.14*   Lab Results  Component Value Date   WBC 9.2 07/11/2015   HGB 14.5 07/11/2015   HCT 42.8 07/11/2015   MCV 97.1 07/11/2015   PLT 239 07/11/2015     Recent Labs Lab 07/11/15 1728  NA 142  K 3.2*  CL 108  CO2 27  BUN 16  CREATININE 1.22  CALCIUM 8.9  GLUCOSE 122*   Lab Results  Component Value Date   CHOL 157 09/14/2013   HDL 25* 09/14/2013   LDLCALC 71 09/14/2013   TRIG 305* 09/14/2013   Lab Results  Component Value Date   DDIMER <0.27 10/19/2014     Radiology/Studies  Dg Chest Portable 1 View  07/11/2015  CLINICAL DATA:  SOB, chest pain, swelling of hands, feet, and face, Hx chf- pt was seen at [facility] yesterday EXAM: PORTABLE CHEST - 1 VIEW COMPARISON:  07/09/2015 FINDINGS: Mild cardiomegaly.  Lungs are clear.  No pneumothorax. No effusion. Degenerative spurring in the mid and lower thoracic spine. IMPRESSION: Stable cardiomegaly.  No  acute disease. Electronically Signed   By: Corlis Leak M.D.   On: 07/11/2015 18:09    Echocardiogram 08/09/2014: Study Conclusions - Procedure narrative:  Transthoracic echocardiography. Image quality was poor. The study was technically difficult, as a result of poor sound wave transmission and body habitus. - Left ventricle: The cavity size was normal. There was moderate concentric hypertrophy. Systolic function was normal. The estimated ejection fraction was in the range of 60% to 65%. While the images were inadequate for LV wall motion assessment, there were no gross regional wall abnormalities. Findings consistent with left ventricular diastolic dysfunction. There was no evidence of elevated ventricular filling pressure by Doppler parameters. - Left atrium: The atrium was mildly dilated.    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  Left Anterior Descending Artery: Large caliber vessel that does not reach the apex. There is 30% ostial/proximal stenosis. The mid vessel has a 30% stenosis.   Circumflex Artery: Moderate caliber vessel with three small obtuse marginal branches. There is diffuse 30% proximal stenosis, 50% mid stenosis at the takeoff of the third OM branch with 50% ostial stenosis in the small caliber third OM branch. Moderate caliber ramus intermediate branch with proximal 30% stenosis.   Right Coronary Artery: Large dominant vessel with 40% ostial stenosis, mild mid vessel irregularity. The posterolateral branch is a large caliber vessel. Distally, the posterolateral branch bifurcates and becomes small in caliber. In the distal branch, there is a 70% stenosis (1.75 mm vessel). The PDA has mild plaque disease.   Left Ventricular Angiogram: LVEF=50-55%.     ASSESSMENT AND PLAN  [name] is a 59 y.o. male with a history of COPD, continued tobacco abuse, alcohol abuse/previous cocaine use, HTN, HLD, obesity, GERD, orthostatic hypotension, chronic chest pain on Ranexa and non obstructive CAD who is transferred from [facility] to  [facility] tonight for chest pain and positive cardiac enzymes (troponin 1.04).   # CAD, elevated cardiac enzymes: [name] troponin was mildly elevated. At this point it is unclear whether this is due to ACS, acute heart failure, or COPD exacerbation. We will continue to cycle cardiac enzymes to assess for a typical rise and fall characteristic of ACS. For now we will start heparin given that he has been experiencing chest pain. We will also titrate his nitroglycerin infusion.  He is on percocet tid.  We will continue his home opiate and given morphine for break through if his pain cannot be managed with nitroglycerin. Hold Imdur while on nitroglycerin infusion. Continue home atorvastatin.  We will also start metoprolol 12.5 mg bid.  Echo pending.  # Hypertension: BP is currently poorly controlled. Continue home amlodipine and hydrochlorothiazide. Will start metoprolol as above.   # Acute heart failure, type unknown: Echo 07/2014 showed normal systolic and diastolic function.  BNP was mildly elevated today and he reports edema. At this point he appears to be only mildly volume overloaded. We'll continue his home Lasix daily and check an echo.  # COPD: [name] seems to be having a COPD exacerbation.  We will continue his home nebs and start solumedrol 60 mg q6h x1 day followed by prednisone 40 mg daily x6 days.  He does not have any evidence of infection and thinks that his flare may have been triggered from being at a party with a lot of smoking around him.  # Tobacco abuse: [name] was congratulated on reducing his tobacco intake.  He does not  need a patch while in the hospital.  Signed, Emmons Toth C. Duke Salvia, MD 07/11/2015 10:25 PM

## 2015-07-11 NOTE — ED Notes (Signed)
Pt reports he lost his wife 2 months ago.  Reports has  " a bad heart."  Reports 2 hours ago started having chest pain and swelling all over.  Pt says took 3 nitro and pain got better for a little while then came back.

## 2015-07-11 NOTE — ED Provider Notes (Signed)
CSN: [id number]     Arrival date & time 07/11/15  1712 History   First MD Initiated Contact with Patient 07/11/15 1728     Chief Complaint  Patient presents with  . Chest Pain      HPI Per pt, c/o gradual onset and persistence of constant chest "pain" that began approximately 1030 this morning. Pt describes the CP as "stabbing" and "aching," with occasional radiation into his neck and left arm. Has been associated with SOB and diaphoresis. CP worsens with exertion, improved somewhat with his own SL ntg. Pt states he was evaluated at OSH yesterday for same "and stayed overnight and they told me my tests were ok." States he was discharged this morning and told "if the CP came back I was supposed to come here because my Cardiologist is here." Denies palpitations, no cough, no abd pain, no N/V/D, no fevers.    Cards: Dr. Diona Browner Past Medical History  Diagnosis Date  . COPD (chronic obstructive pulmonary disease) (HCC)   . Essential hypertension, benign   . Chronic back pain   . Anxiety disorder   . Hyperlipidemia   . Depression   . Obesity   . History of alcohol abuse   . History of cocaine abuse   . Pneumonia   . GERD (gastroesophageal reflux disease)   . Headache(784.0)   . Arthritis   . CAD (coronary artery disease)     a. ~ 2000 cath at [facility];  b. 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  . CHF (congestive heart failure) (HCC)   . Chronic hip pain    Past Surgical History  Procedure Laterality Date  . Hip surgery      rt  . Cholecystectomy    . Back surgery    . Nasal/sinus endoscopy    . Total hip arthroplasty  05/08/2012    Procedure: TOTAL HIP ARTHROPLASTY;  Surgeon: Nestor Lewandowsky, MD;  Location: [facility] OR;  Service: Orthopedics;  Laterality: Left;  . Joint replacement    . Left heart catheterization with coronary angiogram N/A  09/12/2013    Procedure: LEFT HEART CATHETERIZATION WITH CORONARY ANGIOGRAM;  Surgeon: Kathleene Hazel, MD;  Location: [facility] CATH LAB;  Service: Cardiovascular;  Laterality: N/A;  . Total hip arthroplasty  2014 per pt   Family History  Problem Relation Age of Onset  . Heart attack Mother   . Heart attack Brother   . Heart attack Sister    Social History  Substance Use Topics  . Smoking status: Current Every Day Smoker -- 0.25 packs/day for 39 years  . Smokeless tobacco: Never Used  . Alcohol Use: No     Comment: denies    Review of Systems ROS: Statement: All systems negative except as marked or noted in the HPI; Constitutional: Negative for fever and chills. ; ; Eyes: Negative for eye pain, redness and discharge. ; ; ENMT: Negative for ear pain, hoarseness, nasal congestion, sinus pressure and sore throat. ; ; Cardiovascular: +CP, SOB, diaphoresis. Negative for palpitations, and peripheral edema. ; ; Respiratory: Negative for cough, wheezing and stridor. ; ; Gastrointestinal: Negative for nausea, vomiting, diarrhea, abdominal pain, blood in stool, hematemesis, jaundice and rectal bleeding. . ; ; Genitourinary: Negative for dysuria, flank pain and hematuria. ; ; Musculoskeletal: Negative for back pain and neck pain. Negative for  swelling and trauma.; ; Skin: Negative for pruritus, rash, abrasions, blisters, bruising and skin lesion.; ; Neuro: Negative for headache, lightheadedness and neck stiffness. Negative for weakness, altered level of consciousness , altered mental status, extremity weakness, paresthesias, involuntary movement, seizure and syncope.     Allergies  Ativan; Dilaudid; and Haloperidol decanoate  Home Medications   Prior to Admission medications   Medication Sig Start Date End Date Taking? Authorizing Provider  ALPRAZolam Prudy Feeler) 1 MG tablet Take 1 tablet (1 mg total) by mouth 3 (three) times daily as needed for anxiety. For anxiety 05/15/12   Kathlen Mody, MD   amLODipine (NORVASC) 5 MG tablet Take 10 mg by mouth daily.  08/06/14   Historical Provider, MD  aspirin 81 MG tablet Take 1 tablet (81 mg total) by mouth daily. 05/31/12   Kathlen Mody, MD  atorvastatin (LIPITOR) 10 MG tablet Take 1 tablet (10 mg total) by mouth daily. 01/07/15   Jonelle Sidle, MD  budesonide-formoterol [facility]) 160-4.5 MCG/ACT inhaler Inhale 2 puffs into the lungs 2 (two) times daily.    Historical Provider, MD  citalopram (CELEXA) 40 MG tablet Take 40 mg by mouth daily.      Historical Provider, MD  cyclobenzaprine (FLEXERIL) 5 MG tablet Take 1 tablet (5 mg total) by mouth 3 (three) times daily as needed for muscle spasms. 10/20/14   Erick Blinks, MD  doxepin (SINEQUAN) 25 MG capsule Take 25 mg by mouth at bedtime.  08/06/14   Historical Provider, MD  hydrochlorothiazide (HYDRODIURIL) 25 MG tablet Take 25 mg by mouth daily. 08/06/14   Historical Provider, MD  ipratropium-albuterol (DUONEB) 0.5-2.5 (3) MG/3ML SOLN Take 3 mLs by nebulization every 6 (six) hours as needed (shortness of breath/ wheezing.).     Historical Provider, MD  isosorbide mononitrate (IMDUR) 30 MG 24 hr tablet Take 1 tablet (30 mg total) by mouth 2 (two) times daily. Patient taking differently: Take 30 mg by mouth daily.  08/12/14   Nishant Dhungel, MD  meloxicam (MOBIC) 15 MG tablet Take 15 mg by mouth daily.  08/13/13   Historical Provider, MD  nitroGLYCERIN (NITROSTAT) 0.4 MG SL tablet Place 0.4 mg under the tongue every 5 (five) minutes as needed. For chest pain    Historical Provider, MD  omeprazole (PRILOSEC) 20 MG capsule Take 20 mg by mouth daily.    Historical Provider, MD  oxyCODONE-acetaminophen (PERCOCET) 10-325 MG per tablet Take 1 tablet by mouth every 8 (eight) hours as needed for pain.  09/30/14   Historical Provider, MD  potassium chloride SA (K-DUR,KLOR-CON) 20 MEQ tablet Take 20 mEq by mouth 2 (two) times daily.  08/13/13   Historical Provider, MD  PROAIR HFA 108 (90 BASE) MCG/ACT  inhaler Inhale 2 puffs into the lungs daily as needed for wheezing or shortness of breath.  06/20/13   Historical Provider, MD  ranolazine (RANEXA) 500 MG 12 hr tablet Take 1 tablet (500 mg total) by mouth 2 (two) times daily. 08/12/14   Nishant Dhungel, MD  SPIRIVA HANDIHALER 18 MCG inhalation capsule  03/17/15   Historical Provider, MD  traZODone (DESYREL) 100 MG tablet Take 100 mg by mouth at bedtime.     Historical Provider, MD  VOLTAREN 1 % GEL Apply 2 g topically daily as needed (pain).  02/27/15   Historical Provider, MD   BP 127/78 mmHg  Pulse 74  Temp(Src) 97.3 F (36.3 C) (Oral)  Resp 16  Ht  (1.905 m)  Wt 327 lb (148.326 kg)  BMI  40.87 kg/m2  SpO2 98% Physical Exam  Physical examination:  Nursing notes reviewed; Vital signs and O2 SAT reviewed;  Constitutional: Well developed, Well nourished, Well hydrated, In no acute distress; Head:  Normocephalic, atraumatic; Eyes: EOMI, PERRL, No scleral icterus; ENMT: Mouth and pharynx normal, Mucous membranes moist; Neck: Supple, Full range of motion, No lymphadenopathy; Cardiovascular: Regular rate and rhythm, No gallop; Respiratory: Breath sounds clear & equal bilaterally, No wheezes.  Speaking full sentences with ease, Normal respiratory effort/excursion; Chest: Nontender, Movement normal; Abdomen: Soft, Nontender, Nondistended, Normal bowel sounds; Genitourinary: No CVA tenderness; Extremities: Pulses normal, No tenderness, No edema, No calf edema or asymmetry.; Neuro: AA&Ox3, Major CN grossly intact.  Speech clear. No gross focal motor or sensory deficits in extremities.; Skin: Color normal, Warm, Dry.   ED Course  Procedures (including critical care time) Labs Review   Imaging Review  I have personally reviewed and evaluated these images and lab results as part of my medical decision-making.   EKG Interpretation   Date/Time:  Friday July 11 2015 17:16:53 EST Ventricular Rate:  79 PR Interval:  176 QRS Duration: 92 QT  Interval:  398 QTC Calculation: 456 R Axis:   -19 Text Interpretation:  Normal sinus rhythm Low voltage QRS Baseline wander  When compared with ECG of 04/30/2015 No significant change was found  Confirmed by [facility]  MD, Nicholos Johns [phone]) on 07/11/2015 6:43:12 PM      MDM  MDM Reviewed: previous chart, nursing note and vitals Reviewed previous: labs and ECG Interpretation: labs, ECG and x-ray Total time providing critical care: 30-74 minutes. This excludes time spent performing separately reportable procedures and services. Consults: cardiology     CRITICAL CARE Performed by: Laray Anger Total critical care time: 35 minutes Critical care time was exclusive of separately billable procedures and treating other patients. Critical care was necessary to treat or prevent imminent or life-threatening deterioration. Critical care was time spent personally by me on the following activities: development of treatment plan with patient and/or surrogate as well as nursing, discussions with consultants, evaluation of patient's response to treatment, examination of patient, obtaining history from patient or surrogate, ordering and performing treatments and interventions, ordering and review of laboratory studies, ordering and review of radiographic studies, pulse oximetry and re-evaluation of patient's condition.   Results for orders placed or performed during the hospital encounter of 07/11/15  CBC with Differential  Result Value Ref Range   WBC 9.2 4.0 - 10.5 K/uL   RBC 4.41 4.22 - 5.81 MIL/uL   Hemoglobin 14.5 13.0 - 17.0 g/dL   HCT 98.1 19.1 - 47.8 %   MCV 97.1 78.0 - 100.0 fL   MCH 32.9 26.0 - 34.0 pg   MCHC 33.9 30.0 - 36.0 g/dL   RDW 29.5 62.1 - 30.8 %   Platelets 239 150 - 400 K/uL   Neutrophils Relative % 46 %   Neutro Abs 4.2 1.7 - 7.7 K/uL   Lymphocytes Relative 39 %   Lymphs Abs 3.6 0.7 - 4.0 K/uL   Monocytes Relative 10 %   Monocytes Absolute 0.9 0.1 - 1.0 K/uL    Eosinophils Relative 4 %   Eosinophils Absolute 0.4 0.0 - 0.7 K/uL   Basophils Relative 1 %   Basophils Absolute 0.1 0.0 - 0.1 K/uL  Basic metabolic panel  Result Value Ref Range   Sodium 142 135 - 145 mmol/L   Potassium 3.2 (L) 3.5 - 5.1 mmol/L   Chloride 108 101 - 111 mmol/L  CO2 27 22 - 32 mmol/L   Glucose, Bld 122 (H) 65 - 99 mg/dL   BUN 16 6 - 20 mg/dL   Creatinine, Ser 9.60 0.61 - 1.24 mg/dL   Calcium 8.9 8.9 - 45.4 mg/dL   GFR calc non Af Amer >60 >60 mL/min   GFR calc Af Amer >60 >60 mL/min   Anion gap 7 5 - 15  Troponin I  Result Value Ref Range   Troponin I 0.14 (H) <0.031 ng/mL  Brain natriuretic peptide  Result Value Ref Range   B Natriuretic Peptide 114.0 (H) 0.0 - 100.0 pg/mL   Dg Chest Portable 1 View 07/11/2015  CLINICAL DATA:  SOB, chest pain, swelling of hands, feet, and face, Hx chf- pt was seen at [facility] hospital yesterday EXAM: PORTABLE CHEST - 1 VIEW COMPARISON:  07/09/2015 FINDINGS: Mild cardiomegaly.  Lungs are clear.  No pneumothorax. No effusion. Degenerative spurring in the mid and lower thoracic spine. IMPRESSION: Stable cardiomegaly.  No acute disease. Electronically Signed   By: Corlis Leak M.D.   On: 07/11/2015 18:09    1850:  Troponin elevated, EKG without change from previous. Pt given ASA, SL ntg and IV morphine with "some" improvement. IV ntg ordered, as well as IV heparin. Dx and testing d/w pt.  Questions answered.  Verb understanding, agreeable to transfer to [facility] for admit.  T/C to [facility] Cards Dr. Rennis Golden, case discussed, including:  HPI, pertinent PM/SHx, VS/PE, dx testing, ED course and treatment:  Agreeable to accept transfer/admit, requests to write temporary orders, obtain [facility] bed.     Samuel Jester, DO 07/13/15 2001

## 2015-07-11 NOTE — Progress Notes (Signed)
ANTICOAGULATION CONSULT NOTE - Initial Consult  Pharmacy Consult for Heparin Indication: chest pain/ACS  Allergies  Allergen Reactions  . Ativan [Lorazepam] Other (See Comments)    "out of mind"  . Dilaudid [Hydromorphone Hcl] Other (See Comments)    " out of mind"  . Haloperidol Decanoate     Patient Measurements: Height: 6\' 3"  (190.5 cm) Weight: (!) 327 lb (148.326 kg) IBW/kg (Calculated) : 84.5 Heparin Dosing Weight: 118kg  Vital Signs: Temp: 97.3 F (36.3 C) (11/18 1720) Temp Source: Oral (11/18 1720) BP: 127/78 mmHg (11/18 1902) Pulse Rate: 74 (11/18 1902)  Labs:  Recent Labs  07/11/15 1728  HGB 14.5  HCT 42.8  PLT 239  CREATININE 1.22  TROPONINI 0.14*    Estimated Creatinine Clearance: 103.9 mL/min (by C-G formula based on Cr of 1.22).   Medical History: Past Medical History  Diagnosis Date  . COPD (chronic obstructive pulmonary disease) (HCC)   . Essential hypertension, benign   . Chronic back pain   . Anxiety disorder   . Hyperlipidemia   . Depression   . Obesity   . History of alcohol abuse   . History of cocaine abuse   . Pneumonia   . GERD (gastroesophageal reflux disease)   . Headache(784.0)   . Arthritis   . CAD (coronary artery disease)     a. ~ 2000 cath at [facility];  b. 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  . CHF (congestive heart failure) (HCC)   . Chronic hip pain     Medications:  See med rec  Assessment: 59 yo male who presented to the ER with chest pain with radiation into his neck and left arm. He is SOB and diaphoretic. Relieved to some extent with nitro stat at home. Plan to initiate heparin for ACS and evaluate further.  Goal of Therapy:  Heparin level 0.3-0.7 units/ml Monitor platelets by anticoagulation protocol: Yes   Plan:  Give 4000 units bolus x 1 Start heparin infusion  at 1600 units/hr Check anti-Xa level in 6 hours and daily while on heparin Continue to monitor H&H and platelets  Elder Cyphers, BS Loura Back, BCPS Clinical Pharmacist Pager [phone] 07/11/2015,7:06 PM

## 2015-07-12 ENCOUNTER — Inpatient Hospital Stay (HOSPITAL_COMMUNITY): Payer: Medicare HMO

## 2015-07-12 DIAGNOSIS — I2510 Atherosclerotic heart disease of native coronary artery without angina pectoris: Secondary | ICD-10-CM

## 2015-07-12 LAB — CBC
HCT: 40.1 % (ref 39.0–52.0)
HEMOGLOBIN: 13.3 g/dL (ref 13.0–17.0)
MCH: 32.5 pg (ref 26.0–34.0)
MCHC: 33.2 g/dL (ref 30.0–36.0)
MCV: 98 fL (ref 78.0–100.0)
PLATELETS: 226 10*3/uL (ref 150–400)
RBC: 4.09 MIL/uL — AB (ref 4.22–5.81)
RDW: 13.1 % (ref 11.5–15.5)
WBC: 7.3 10*3/uL (ref 4.0–10.5)

## 2015-07-12 LAB — LIPID PANEL
CHOL/HDL RATIO: 3.3 ratio
Cholesterol: 135 mg/dL (ref 0–200)
HDL: 41 mg/dL (ref >40)
LDL Cholesterol: 78 mg/dL (ref 0–99)
Triglycerides: 81 mg/dL (ref <150)
VLDL: 16 mg/dL (ref 0–40)

## 2015-07-12 LAB — TROPONIN I
TROPONIN I: 0.08 ng/mL — AB (ref <0.031)
Troponin I: 0.11 ng/mL — ABNORMAL HIGH (ref <0.031)

## 2015-07-12 LAB — HEPARIN LEVEL (UNFRACTIONATED)
HEPARIN UNFRACTIONATED: 0.24 [IU]/mL — AB (ref 0.30–0.70)
Heparin Unfractionated: 0.36 IU/mL (ref 0.30–0.70)

## 2015-07-12 MED ORDER — OXYCODONE-ACETAMINOPHEN 5-325 MG PO TABS
1.0000 | ORAL_TABLET | Freq: Three times a day (TID) | ORAL | Status: DC
  Administered 2015-07-12 – 2015-07-13 (×4): 1 via ORAL
  Filled 2015-07-12 (×4): qty 1

## 2015-07-12 MED ORDER — NITROGLYCERIN IN D5W 200-5 MCG/ML-% IV SOLN
5.0000 ug/min | INTRAVENOUS | Status: DC
  Administered 2015-07-12: 20 ug/min via INTRAVENOUS

## 2015-07-12 MED ORDER — OXYCODONE HCL 5 MG PO TABS
5.0000 mg | ORAL_TABLET | Freq: Three times a day (TID) | ORAL | Status: DC
Start: 2015-07-12 — End: 2015-07-13
  Administered 2015-07-12 – 2015-07-13 (×4): 5 mg via ORAL
  Filled 2015-07-12 (×4): qty 1

## 2015-07-12 MED ORDER — OXYCODONE HCL 5 MG PO TABS: 5.0000 mg | ORAL_TABLET | Freq: Three times a day (TID) | ORAL | Status: DC

## 2015-07-12 MED ORDER — MORPHINE SULFATE (PF) 2 MG/ML IV SOLN
2.0000 mg | INTRAVENOUS | Status: DC | PRN
  Administered 2015-07-12 (×4): 2 mg via INTRAVENOUS
  Administered 2015-07-12: 4 mg via INTRAVENOUS
  Administered 2015-07-12 – 2015-07-14 (×7): 2 mg via INTRAVENOUS
  Administered 2015-07-14: 4 mg via INTRAVENOUS
  Filled 2015-07-12: qty 2
  Filled 2015-07-12 (×8): qty 1
  Filled 2015-07-12: qty 2
  Filled 2015-07-12 (×2): qty 1
  Filled 2015-07-12: qty 2
  Filled 2015-07-12 (×3): qty 1

## 2015-07-12 MED ORDER — OXYCODONE-ACETAMINOPHEN 5-325 MG PO TABS: 1.0000 | ORAL_TABLET | Freq: Three times a day (TID) | ORAL | Status: DC

## 2015-07-12 NOTE — Progress Notes (Signed)
Utilization Review Completed.  

## 2015-07-12 NOTE — Progress Notes (Signed)
Patient Name: [name] Date of Encounter: 07/12/2015  Principal Problem:   NSTEMI (non-ST elevated myocardial infarction) [facility]) Active Problems:   Essential hypertension, benign   Chronic back pain   Primary Cardiologist: Dr Diona Browner  Patient Profile: 59 yo male w/ hx COPD, recently almost quit tob, remote ETOH/Cocaine use, HTN, HL, GERD, obesity, orthostatic hypotension, non-obs CAD by cath x 3 (last in 2015), chronic chest pain on Ranexa. Tx from [facility] ER last pm for CP, elevated troponin.   SUBJECTIVE: Chest pain is severe, except when given narcotics. Worse with exertion, deep inspiration and movement. Lost his wife 2 mo ago. Recent party where "something weird" was being smoked and started feeling bad. UDS + cocaine.   OBJECTIVE Filed Vitals:   07/11/15 1951 07/11/15 2111 07/12/15 0443 07/12/15 0700  BP: 115/69 125/72 118/58   Pulse: 80 78    Temp: 97.8 F (36.6 C) 97.8 F (36.6 C)    TempSrc: Oral Oral    Resp: 14 20    Height:   (1.905 m)    Weight:  332 lb 4.8 oz (150.73 kg)  310 lb 12.8 oz (140.978 kg)  SpO2: 98% 99%      Intake/Output Summary (Last 24 hours) at 07/12/15 0812 Last data filed at 07/12/15 1610  Gross per 24 hour  Intake 1901.56 ml  Output   3750 ml  Net -1848.44 ml   Filed Weights   07/11/15 1720 07/11/15 2111 07/12/15 0700  Weight: 327 lb (148.326 kg) 332 lb 4.8 oz (150.73 kg) 310 lb 12.8 oz (140.978 kg)    PHYSICAL EXAM General: Well developed, well nourished, male in no acute distress. Head: Normocephalic, atraumatic.  Neck: Supple without bruits, JVD not elevated. Lungs:  Resp regular and unlabored, decreased BS bases. Heart: RRR, S1, S2, no S3, S4, or murmur; no rub. Abdomen: Soft, non-tender, non-distended, BS + x 4.  Extremities: No clubbing, cyanosis, edema.  Neuro: Alert and oriented X 3. Moves all extremities spontaneously. Psych: Normal affect.  LABS: CBC:  Recent Labs  07/11/15 1728 07/12/15 0347  WBC 9.2  7.3  NEUTROABS 4.2  --   HGB 14.5 13.3  HCT 42.8 40.1  MCV 97.1 98.0  PLT 239 226   INR:  Recent Labs  07/11/15 1728  INR 1.04   Basic Metabolic Panel:  Recent Labs  96/04/54 1728  NA 142  K 3.2*  CL 108  CO2 27  GLUCOSE 122*  BUN 16  CREATININE 1.22  CALCIUM 8.9   Cardiac Enzymes:  Recent Labs  07/11/15 1728 07/11/15 2319 07/12/15 0347  TROPONINI 0.14* 0.11* 0.08*   BNP:  B NATRIURETIC PEPTIDE  Date/Time Value Ref Range Status  07/11/2015 05:33 PM 114.0* 0.0 - 100.0 pg/mL Final  04/29/2015 10:45 PM 29.0 0.0 - 100.0 pg/mL Final   Hemoglobin A1C:No results for input(s): HGBA1C in the last 72 hours. Fasting Lipid Panel:  Recent Labs  07/12/15 0347  CHOL 135  HDL 41  LDLCALC 78  TRIG 81  CHOLHDL 3.3   Drugs of Abuse     Component Value Date/Time   LABOPIA NONE DETECTED 07/11/2015 1900   COCAINSCRNUR POSITIVE* 07/11/2015 1900   LABBENZ NONE DETECTED 07/11/2015 1900   AMPHETMU NONE DETECTED 07/11/2015 1900   THCU NONE DETECTED 07/11/2015 1900   LABBARB NONE DETECTED 07/11/2015 1900     TELE:  SR      ECG: No acute changes  Radiology/Studies: Dg Chest Portable 1 View 07/11/2015  CLINICAL DATA:  SOB, chest pain, swelling of hands, feet, and face, Hx chf- pt was seen at [facility] hospital yesterday EXAM: PORTABLE CHEST - 1 VIEW COMPARISON:  07/09/2015 FINDINGS: Mild cardiomegaly.  Lungs are clear.  No pneumothorax. No effusion. Degenerative spurring in the mid and lower thoracic spine. IMPRESSION: Stable cardiomegaly.  No acute disease. Electronically Signed   By: Corlis Leak M.D.   On: 07/11/2015 18:09     Current Medications:  . amLODipine  10 mg Oral Daily  . aspirin  324 mg Oral NOW   Or  . aspirin  300 mg Rectal NOW  . aspirin EC  81 mg Oral Daily  . atorvastatin  10 mg Oral q1800  . budesonide-formoterol  2 puff Inhalation BID  . citalopram  10 mg Oral q morning - 10a  . doxepin  25 mg Oral QHS  . furosemide  40 mg Oral q morning - 10a   . hydrochlorothiazide  25 mg Oral q morning - 10a  . methylPREDNISolone (SOLU-MEDROL) injection  60 mg Intravenous Q6H  . metoprolol tartrate  12.5 mg Oral BID  . oxyCODONE  5 mg Oral 3 times per day   And  . oxyCODONE-acetaminophen  1 tablet Oral 3 times per day  . pantoprazole  40 mg Oral Daily  . potassium chloride SA  20 mEq Oral q morning - 10a  . [START ON 07/13/2015] predniSONE  40 mg Oral QAC breakfast  . ranolazine  500 mg Oral BID  . tiotropium  18 mcg Inhalation Daily   . heparin 1,600 Units/hr (07/11/15 1937)  . nitroGLYCERIN      ASSESSMENT AND PLAN: Principal Problem: 1.  NSTEMI (non-ST elevated myocardial infarction) (HCC) - troponin trending down - cath x 3, never had PCI - pleuritic component to pain - NTG no help - peak troponin 0.14 (initial) then trended down - Echo ordered - will use morphine while here  Active Problems: 2.  Essential hypertension, benign - BB added, good control now  3.  Chronic back pain - on home percocet  4. Hyperglycemia - ck A1c  5. Hypokalemia - supp, recheck am  6. UDS + cocaine - pt denies use.  Signed, Leanna Battles 8:12 AM 07/12/2015  Patient seen and examined, above note read.  Chart reviewed.  The patient says that he was at a party with friends which was a [employer].  He denies using cocaine but states that there was thick with smoke and does have a urinary drug screen positive for cocaine.  Suspect this is a cocaine-induced issue.  Continue heparin and nitroglycerin overnight and try to get off of IVs in the morning.  Exam very obese male but friendly in no acute distress-try to manage medically nothing else to add to note above.  Darden Palmer. MD [facility] 07/12/2015 10:38 AM

## 2015-07-12 NOTE — Progress Notes (Signed)
Pharmacy suggests Librium detox protocol if patient needs CIWA since abnormal reaction listed for Ativan.

## 2015-07-12 NOTE — Progress Notes (Signed)
[id number] Initiated Phase 1 contact with patient. MI book given, discussed risk factor modification. Discussed Phase 2 cardiac rehab with patient for after discharge. Patient doesn't think he will be able to attend because of transportation, but will consider if able to get transportation. Follow up after cath on Monday.  Artist Pais, MS, ACSM CCEP

## 2015-07-12 NOTE — Progress Notes (Signed)
*  PRELIMINARY RESULTS* Echocardiogram 2D Echocardiogram has been performed.  Doristine Section 07/12/2015, 11:04 AM

## 2015-07-12 NOTE — Progress Notes (Addendum)
ANTICOAGULATION CONSULT NOTE - Follow Up Consult  Pharmacy Consult for heparin Indication: chest pain/ACS  Allergies  Allergen Reactions  . Ativan [Lorazepam] Other (See Comments)    "out of mind"  . Dilaudid [Hydromorphone Hcl] Other (See Comments)    " out of mind"  . Haloperidol Decanoate     Patient Measurements: Height: 6\' 3"  (190.5 cm) Weight: (!) 310 lb 12.8 oz (140.978 kg) IBW/kg (Calculated) : 84.5 Heparin Dosing Weight: 118 kg  Vital Signs: Temp: 97.8 F (36.6 C) (11/18 2111) Temp Source: Oral (11/18 2111) BP: 118/58 mmHg (11/19 0443) Pulse Rate: 78 (11/18 2111)  Labs:  Recent Labs  07/11/15 1728 07/11/15 2319 07/12/15 0347 07/12/15 0748  HGB 14.5  --  13.3  --   HCT 42.8  --  40.1  --   PLT 239  --  226  --   LABPROT 13.8  --   --   --   INR 1.04  --   --   --   HEPARINUNFRC  --   --   --  0.24*  CREATININE 1.22  --   --   --   TROPONINI 0.14* 0.11* 0.08*  --     Estimated Creatinine Clearance: 101.2 mL/min (by C-G formula based on Cr of 1.22).   Assessment:  59 y/o M presented to [facility] ED w/ chest pain and radiation into neck and left arm. With SOB and diaphoretic. Troponins mildly elevated. Heparin initiated for ACS and pt transferred to [facility]. HL subtherapeutic at 0.24 this AM. Hgb 13.3, plts 226.  Goal of Therapy:  Heparin level 0.3-0.7 units/ml Monitor platelets by anticoagulation protocol: Yes   Plan:  Increase Heparin gtt to 1850 units/hr Check 6 hr anti-Xa level Daily HL, CBC Monitor for S&S of bleed  Sandi Carne, PharmD Pharmacy Resident Pager: [phone] 07/12/2015,9:05 AM   Addendum: Heparin level is now therapeutic at 0.36 after rate increase. Continue heparin at 1850 units/hr and follow up AM labs.  Louie Casa, PharmD, BCPS 07/12/2015, 3:52 PM

## 2015-07-13 LAB — CBC
HCT: 38.7 % — ABNORMAL LOW (ref 39.0–52.0)
Hemoglobin: 13.3 g/dL (ref 13.0–17.0)
MCH: 32.7 pg (ref 26.0–34.0)
MCHC: 34.4 g/dL (ref 30.0–36.0)
MCV: 95.1 fL (ref 78.0–100.0)
Platelets: 278 10*3/uL (ref 150–400)
RBC: 4.07 MIL/uL — ABNORMAL LOW (ref 4.22–5.81)
RDW: 12.4 % (ref 11.5–15.5)
WBC: 16.1 10*3/uL — ABNORMAL HIGH (ref 4.0–10.5)

## 2015-07-13 LAB — LIPID PANEL
CHOLESTEROL: 151 mg/dL (ref 0–200)
HDL: 57 mg/dL (ref >40)
LDL Cholesterol: 85 mg/dL (ref 0–99)
TRIGLYCERIDES: 45 mg/dL (ref <150)
Total CHOL/HDL Ratio: 2.6 RATIO
VLDL: 9 mg/dL (ref 0–40)

## 2015-07-13 LAB — HEPARIN LEVEL (UNFRACTIONATED): HEPARIN UNFRACTIONATED: 0.37 [IU]/mL (ref 0.30–0.70)

## 2015-07-13 MED ORDER — HEPARIN SODIUM (PORCINE) 5000 UNIT/ML IJ SOLN
5000.0000 [IU] | Freq: Three times a day (TID) | INTRAMUSCULAR | Status: DC
  Administered 2015-07-13 – 2015-07-14 (×3): 5000 [IU] via SUBCUTANEOUS
  Filled 2015-07-13 (×3): qty 1

## 2015-07-13 MED ORDER — OXYCODONE HCL 5 MG PO TABS
5.0000 mg | ORAL_TABLET | Freq: Three times a day (TID) | ORAL | Status: DC
  Administered 2015-07-13 – 2015-07-14 (×3): 5 mg via ORAL
  Filled 2015-07-13 (×3): qty 1

## 2015-07-13 MED ORDER — OXYCODONE-ACETAMINOPHEN 5-325 MG PO TABS
1.0000 | ORAL_TABLET | Freq: Three times a day (TID) | ORAL | Status: DC
  Administered 2015-07-13 – 2015-07-14 (×3): 1 via ORAL
  Filled 2015-07-13 (×3): qty 1

## 2015-07-13 NOTE — Progress Notes (Signed)
ANTICOAGULATION CONSULT NOTE - Follow Up Consult  Pharmacy Consult for heparin Indication: chest pain/ACS  Allergies  Allergen Reactions  . Ativan [Lorazepam] Other (See Comments)    "out of mind"  . Dilaudid [Hydromorphone Hcl] Other (See Comments)    " out of mind"  . Haloperidol Decanoate     Patient Measurements: Height: 6\' 3"  (190.5 cm) Weight: (!) 323 lb (146.512 kg) IBW/kg (Calculated) : 84.5 Heparin Dosing Weight: 118 kg  Vital Signs: BP: 110/74 mmHg (11/19 2255) Pulse Rate: 80 (11/19 2255)  Labs:  Recent Labs  07/11/15 1728 07/11/15 2319 07/12/15 0347 07/12/15 0748 07/12/15 1452 07/13/15 0159  HGB 14.5  --  13.3  --   --  13.3  HCT 42.8  --  40.1  --   --  38.7*  PLT 239  --  226  --   --  278  LABPROT 13.8  --   --   --   --   --   INR 1.04  --   --   --   --   --   HEPARINUNFRC  --   --   --  0.24* 0.36 0.37  CREATININE 1.22  --   --   --   --   --   TROPONINI 0.14* 0.11* 0.08*  --   --   --     Estimated Creatinine Clearance: 103.3 mL/min (by C-G formula based on Cr of 1.22).  Assessment:  59 y/o M presented to [facility] ED w/ chest pain and radiation into neck and left arm. With SOB and diaphoretic. Troponins mildly elevated. Heparin initiated for ACS and pt transferred to [facility]. HL confirmed therapeutic this AM 0.36>0.37. Hgb 13.3, plts 278.  Goal of Therapy:  Heparin level 0.3-0.7 units/ml Monitor platelets by anticoagulation protocol: Yes   Plan:  Continue Heparin gtt 1850 units/hr Daily HL, CBC Monitor for S&S of bleed  Sandi Carne, PharmD Pharmacy Resident Pager: [phone] 07/13/2015,7:22 AM

## 2015-07-13 NOTE — Progress Notes (Signed)
Subjective:  Chest pain has gotten better.  Troponins have slowly improved.  Has chronic pain and takes narcotics at home.  Still asking for morphine because of back and abdominal pain  Objective:  Vital Signs in the last 24 hours: BP 124/57 mmHg  Pulse 72  Temp(Src) 98.5 F (36.9 C) (Oral)  Resp 21  Ht 6\' 3"  (1.905 m)  Wt 146.512 kg (323 lb)  BMI 40.37 kg/m2  SpO2 94%  Physical Exam: Morbidly obese male heavily tattooed but currently no acute distress  Lungs:  Clear Cardiac:  Regular rhythm, normal S1 and S2, no S3 Abdomen:  Soft, nontender, no masses Extremities:  No edema present  Intake/Output from previous day: 11/19 0701 - 11/20 0700 In: 3834.5 [P.O.:3080; I.V.:754.5] Out: 5255 [Urine:5255]  Weight Filed Weights   07/11/15 2111 07/12/15 0700 07/13/15 0700  Weight: 150.73 kg (332 lb 4.8 oz) 140.978 kg (310 lb 12.8 oz) 146.512 kg (323 lb)    Lab Results: Basic Metabolic Panel:  Recent Labs  11/04/92 1728  NA 142  K 3.2*  CL 108  CO2 27  GLUCOSE 122*  BUN 16  CREATININE 1.22   CBC:  Recent Labs  07/11/15 1728 07/12/15 0347 07/13/15 0159  WBC 9.2 7.3 16.1*  NEUTROABS 4.2  --   --   HGB 14.5 13.3 13.3  HCT 42.8 40.1 38.7*  MCV 97.1 98.0 95.1  PLT 239 226 278   Cardiac Panel (last 3 results)  Recent Labs  07/11/15 1728 07/11/15 2319 07/12/15 0347  TROPONINI 0.14* 0.11* 0.08*    Telemetry: Sinus rhythm  Assessment/Plan:  1.  Chest pain in the setting of positive cocaine but patient states that he was exposed to aerosol cocaine in the form of a party but did not take it directly Exline 2.  History of coronary artery disease 3.  Chronic pain syndrome   Recommendations:   I'm going to get him back on his oral narcotic regimen that he takes at home and stop his IVs and get him up and ambulate him.  Discussed importance of staying away from cocaine.  Repeat EKG today.   Darden Palmer  MD [facility]  07/13/2015, 9:50  AM

## 2015-07-14 DIAGNOSIS — I214 Non-ST elevation (NSTEMI) myocardial infarction: Principal | ICD-10-CM

## 2015-07-14 LAB — CBC
HEMATOCRIT: 42.7 % (ref 39.0–52.0)
HEMOGLOBIN: 14.2 g/dL (ref 13.0–17.0)
MCH: 32.3 pg (ref 26.0–34.0)
MCHC: 33.3 g/dL (ref 30.0–36.0)
MCV: 97 fL (ref 78.0–100.0)
Platelets: 281 10*3/uL (ref 150–400)
RBC: 4.4 MIL/uL (ref 4.22–5.81)
RDW: 12.8 % (ref 11.5–15.5)
WBC: 14.7 10*3/uL — ABNORMAL HIGH (ref 4.0–10.5)

## 2015-07-14 LAB — HEMOGLOBIN A1C
HEMOGLOBIN A1C: 5.7 % — AB (ref 4.8–5.6)
Mean Plasma Glucose: 117 mg/dL

## 2015-07-14 NOTE — Care Management Important Message (Signed)
Important Message  Patient Details  Name: [name] MRN: [id number] Date of Birth: March 04, 1956   Medicare Important Message Given:  Yes    Kyla Balzarine 07/14/2015, 10:23 AM

## 2015-07-14 NOTE — Progress Notes (Signed)
CARDIAC REHAB PHASE I   PRE:  Rate/Rhythm: 69 SR    BP: sitting 136/79    SaO2: 95 RA  MODE:  Ambulation: 500 ft   POST:  Rate/Rhythm: 81 SR    BP: sitting 130/70     SaO2: 97 RA  Tolerated ambulation well. Sts his CP is 5/10 which is his norm. Pt easily distracted, hard to keep on tract with discussion/education. Attempted to discuss smoking cessation. Pt sts he is going to try to quit. Gave him resources. NSTEMI diagnosis retracted therefore will not refer for CRPII (transportation would be an issue as well.) [id number]  Harriet Masson CES, ACSM 07/14/2015 9:29 AM

## 2015-07-14 NOTE — Discharge Summary (Signed)
Physician Discharge Summary   Cardiologist:    Patient ID: [name] MRN: [id number] DOB/AGE: 1956/04/23 59 y.o.  Admit date: 07/11/2015 Discharge date: 07/14/2015  Admission Diagnoses:  NSTEMI  Discharge Diagnoses:  Principal Problem:   NSTEMI (non-ST elevated myocardial infarction) [facility]) Active Problems:   Essential hypertension, benign   Chronic back pain   Discharged Condition: Stable  Hospital Course:   [name] is a 59 y.o. male with a history of COPD, continued tobacco abuse, alcohol abuse/previous cocaine use, HTN, HLD, obesity, GERD, orthostatic hypotension, chronic chest pain on Ranexa and non obstructive CAD who is transferred from [facility] to [facility] for chest pain and positive cardiac enzymes (troponin 0.14). He started having chest pain yesterday and presented to [facility], where he was reportedly told that everything was OK. Today he continued to have chest pain that was worse with exertion. There was associated shortness of breath and lower extremity edema that began today. He has been unable to walk across the room without dyspnea. He denies orthopnea or PND. He denies fever but has a cough productive of phleghm. He attributed this to decreasing his smoking from a pack per day to one cigarette per day.  He then presented to [facility] with chest pain partially responsive to SL NTG and his cardiac enzymes were noted to be elevated. Per nursing notes patient was upset about recent loss of his wife 2 months ago. He was started on IV heparin and sent to [facility] for further evaluation and treatment. At the time he continued to complain of chest discomfort. He states that the pain is only responsive to IV narcotics.  He underwent LHC ~ 2000 at [facility]. 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 Medical therapy was recommended.  He presented to the ER with chest pain in 08/2014 and ruled out for MI. Patient signed out AMA. He was in the hospital again on 10/20/14 with atypical chest pain and given Flexeril for musculoskeletal pain. Patient also has a history of syncope with bradycardia on telemetry so was taken off beta blocker. He again presented on the ED in 10/2014. Troponin negative but left AMA before cardiology could see him.  Last seen in the office by Dr. Diona Browner in 11/2014: Doing okay from a cardiac stand point. Continued on medical therapy.   He was hospitalized at [facility] sometime in September ( no records ) for syncope and his lasix was discontinued.   He was then seen in the [facility] ED 04/29/15 for chest pain/SOB--> left AMA. Returned to the ED the following day 04/30/15 for sob and chest pain. He then had a syncopal spell in the lobby where "he passed out and became apneic but remained with a pulse and woke up in less than a minute or so". He was found to be orthostatic and referred for admission--> left AMA.  He was started on IV heparin and NTG.   Troponin peaked at 0.14.  He was continued on atorvastatin and started on metoprolol 12.5 mg twice daily. For hypertension we continued amlodipine and hydrochlorothiazide. Home Lasix was continued. I see COPD he was continued on home nebulizer treatments and started on Solu-Medrol 60 mg every 6 hours for 1 day. This was later followed by prednisone 40 mg daily for  6 days.  Morphine was used for pain. UDS was positive for cocaine. Beta blocker was discontinued. Importance of avoidance was discussed with the patient.   2-D echocardiogram which revealed ejection fraction of 60-65% with normal wall motion.  Patient was seen by cardiac rehabilitation. He ambulated 500 feet which he tolerated well. He was complaining of 5 out of 10 chest pain which was normal according to the patient smoking cessation was discussed again. Was  continued on aspirin, statin, amlodipine and Ranexa. The patient actually left AMA prior to being discharged.  The patient was seen by Dr. Jens Som who felt he was stable for DC home.    Consults: Cardiac rehabilitation  Significant Diagnostic Studies:    2-D echocardiogram Study Conclusions  - Left ventricle: The cavity size was normal. Wall thickness was normal. Systolic function was normal. The estimated ejection fraction was in the range of 60% to 65%. Wall motion was normal; there were no regional wall motion abnormalities. - Aortic valve: Valve area (Vmax): 3.02 cm^2. Treatments: See above  Discharge Exam: Blood pressure 130/70, pulse 63, temperature 97.9 F (36.6 C), temperature source Oral, resp. rate 21, height  (1.905 m), weight 323 lb (146.512 kg), SpO2 97 %.   Disposition: 07-Left Against Medical Advice  Discharge Instructions    Diet - low sodium heart healthy    Complete by:  As directed      Increase activity slowly    Complete by:  As directed             Medication List    STOP taking these medications        isosorbide mononitrate 30 MG 24 hr tablet  Commonly known as:  IMDUR      TAKE these medications        ALPRAZolam 1 MG tablet  Commonly known as:  XANAX  Take 1 tablet (1 mg total) by mouth 3 (three) times daily as needed for anxiety. For anxiety     amLODipine 5 MG tablet  Commonly known as:  NORVASC  Take 10 mg by mouth daily.     aspirin 81 MG tablet  Take 1 tablet (81 mg total) by mouth daily.     atorvastatin 10 MG tablet  Commonly known as:  LIPITOR  Take 1 tablet (10 mg total) by mouth daily.     budesonide-formoterol 160-4.5 MCG/ACT inhaler  Commonly known as:  SYMBICORT  Inhale 2 puffs into the lungs 2 (two) times daily.     citalopram 10 MG tablet  Commonly known as:  CELEXA  Take 10 mg by mouth every morning.     doxepin 25 MG capsule  Commonly known as:  SINEQUAN  Take 25 mg by mouth at bedtime.      furosemide 40 MG tablet  Commonly known as:  LASIX  Take 40 mg by mouth every morning.     hydrochlorothiazide 25 MG tablet  Commonly known as:  HYDRODIURIL  Take 25 mg by mouth every morning.     ipratropium-albuterol 0.5-2.5 (3) MG/3ML Soln  Commonly known as:  DUONEB  Take 3 mLs by nebulization every 6 (six) hours as needed (shortness of breath/ wheezing.).     meloxicam 15 MG tablet  Commonly known as:  MOBIC  Take 15 mg by mouth every morning.     nitroGLYCERIN 0.4 MG SL tablet  Commonly known as:  NITROSTAT  Place 0.4 mg under the tongue every 5 (five) minutes as needed. For chest pain  omeprazole 20 MG capsule  Commonly known as:  PRILOSEC  Take 20 mg by mouth every morning.     oxyCODONE-acetaminophen 10-325 MG tablet  Commonly known as:  PERCOCET  Take 1 tablet by mouth 3 (three) times daily.     potassium chloride SA 20 MEQ tablet  Commonly known as:  K-DUR,KLOR-CON  Take 20 mEq by mouth every morning.     PROAIR HFA 108 (90 BASE) MCG/ACT inhaler  Generic drug:  albuterol  Inhale 2 puffs into the lungs daily as needed for wheezing or shortness of breath.     ranolazine 500 MG 12 hr tablet  Commonly known as:  RANEXA  Take 1 tablet (500 mg total) by mouth 2 (two) times daily.     tiotropium 18 MCG inhalation capsule  Commonly known as:  SPIRIVA  Place 18 mcg into inhaler and inhale daily.     VOLTAREN 1 % Gel  Generic drug:  diclofenac sodium  Apply 2 g topically daily as needed (pain).        Greater than 30 minutes was spent completing the patient's discharge.    SignedWilburt Finlay, PAC 07/14/2015, 5:17 PM

## 2015-07-14 NOTE — Progress Notes (Signed)
Subjective:  No chest pain or dyspnea  Objective:  Vital Signs in the last 24 hours: BP 122/76 mmHg  Pulse 63  Temp(Src) 97.9 F (36.6 C) (Oral)  Resp 17  Ht 6\' 3"  (1.905 m)  Wt 146.512 kg (323 lb)  BMI 40.37 kg/m2  SpO2 97%  Physical Exam: Morbidly obese male heavily tattooed but currently no acute distress  HEENT: normal Neck: supple Lungs:  Clear Cardiac:  Regular rhythm Abdomen:  Soft, nontender, no masses Extremities:  No edema present Neuro: grossly intact  Intake/Output from previous day: 11/20 0701 - 11/21 0700 In: 2333.1 [P.O.:2220; I.V.:113.1] Out: 5350 [Urine:5350]  Weight Filed Weights   07/11/15 2111 07/12/15 0700 07/13/15 0700  Weight: 150.73 kg (332 lb 4.8 oz) 140.978 kg (310 lb 12.8 oz) 146.512 kg (323 lb)    Lab Results: Basic Metabolic Panel:  Recent Labs  22/02/54 1728  NA 142  K 3.2*  CL 108  CO2 27  GLUCOSE 122*  BUN 16  CREATININE 1.22   CBC:  Recent Labs  07/11/15 1728  07/13/15 0159 07/14/15 0308  WBC 9.2  < > 16.1* 14.7*  NEUTROABS 4.2  --   --   --   HGB 14.5  < > 13.3 14.2  HCT 42.8  < > 38.7* 42.7  MCV 97.1  < > 95.1 97.0  PLT 239  < > 278 281  < > = values in this interval not displayed. Cardiac Panel (last 3 results)  Recent Labs  07/11/15 1728 07/11/15 2319 07/12/15 0347  TROPONINI 0.14* 0.11* 0.08*    Telemetry: Sinus rhythm  Assessment/Plan:  1.  Chest pain in the setting of positive cocaine but patient states that he was exposed to aerosol cocaine in the form of a party but did not take it directly  2.  History of coronary artery disease 3.  Chronic pain syndrome   Recommendations:   No further chest pain. We'll discharge today and follow-up with Dr. Diona Browner in [place]. He has an appointment next week. Continue aspirin, statin, amlodipine and Ranexa. Patient instructed to avoid cocaine.  > 30 min PA and physician time D2 Olga Millers  MD [facility]  07/14/2015, 9:14 AM

## 2015-07-14 NOTE — Progress Notes (Signed)
Pt insists on not being able to wait 10 minutes for d/c paperwork to be completed by Cardiology PA.  States there is a "59 yo man waiting downstairs to pick me up and he has a doctor's appointment so I can not wait any longer".  Charge nurse aware.  AMA paper signed.

## 2015-08-13 ENCOUNTER — Other Ambulatory Visit: Payer: Self-pay | Admitting: Cardiology

## 2015-08-21 ENCOUNTER — Encounter: Payer: Self-pay | Admitting: Cardiology

## 2015-08-21 ENCOUNTER — Encounter: Payer: Medicare HMO | Admitting: Cardiology

## 2015-08-21 NOTE — Progress Notes (Signed)
No show  This encounter was created in error - please disregard.

## 2015-08-29 DIAGNOSIS — M545 Low back pain: Secondary | ICD-10-CM | POA: Diagnosis not present

## 2015-08-29 DIAGNOSIS — Z6841 Body Mass Index (BMI) 40.0 and over, adult: Secondary | ICD-10-CM | POA: Diagnosis not present

## 2015-08-29 DIAGNOSIS — H8113 Benign paroxysmal vertigo, bilateral: Secondary | ICD-10-CM | POA: Diagnosis not present

## 2015-08-29 DIAGNOSIS — J440 Chronic obstructive pulmonary disease with acute lower respiratory infection: Secondary | ICD-10-CM | POA: Diagnosis not present

## 2015-09-11 DIAGNOSIS — R0789 Other chest pain: Secondary | ICD-10-CM | POA: Diagnosis not present

## 2015-09-11 DIAGNOSIS — R079 Chest pain, unspecified: Secondary | ICD-10-CM | POA: Diagnosis not present

## 2015-09-11 DIAGNOSIS — I469 Cardiac arrest, cause unspecified: Secondary | ICD-10-CM | POA: Diagnosis not present

## 2015-09-11 DIAGNOSIS — I10 Essential (primary) hypertension: Secondary | ICD-10-CM | POA: Diagnosis not present

## 2015-09-11 DIAGNOSIS — R0602 Shortness of breath: Secondary | ICD-10-CM | POA: Diagnosis not present

## 2015-09-11 DIAGNOSIS — R05 Cough: Secondary | ICD-10-CM | POA: Diagnosis not present

## 2015-09-12 DIAGNOSIS — I10 Essential (primary) hypertension: Secondary | ICD-10-CM | POA: Diagnosis not present

## 2015-09-12 DIAGNOSIS — R42 Dizziness and giddiness: Secondary | ICD-10-CM | POA: Diagnosis not present

## 2015-09-12 DIAGNOSIS — R079 Chest pain, unspecified: Secondary | ICD-10-CM | POA: Diagnosis not present

## 2015-09-12 DIAGNOSIS — R0602 Shortness of breath: Secondary | ICD-10-CM | POA: Diagnosis not present

## 2015-09-12 DIAGNOSIS — J441 Chronic obstructive pulmonary disease with (acute) exacerbation: Secondary | ICD-10-CM | POA: Diagnosis not present

## 2015-09-13 DIAGNOSIS — J209 Acute bronchitis, unspecified: Secondary | ICD-10-CM | POA: Diagnosis not present

## 2015-09-13 DIAGNOSIS — J440 Chronic obstructive pulmonary disease with acute lower respiratory infection: Secondary | ICD-10-CM | POA: Diagnosis not present

## 2015-09-13 DIAGNOSIS — R05 Cough: Secondary | ICD-10-CM | POA: Diagnosis not present

## 2015-09-13 DIAGNOSIS — Z6841 Body Mass Index (BMI) 40.0 and over, adult: Secondary | ICD-10-CM | POA: Diagnosis not present

## 2015-09-13 DIAGNOSIS — R079 Chest pain, unspecified: Secondary | ICD-10-CM | POA: Diagnosis not present

## 2015-09-13 DIAGNOSIS — J441 Chronic obstructive pulmonary disease with (acute) exacerbation: Secondary | ICD-10-CM | POA: Diagnosis not present

## 2015-09-13 DIAGNOSIS — I2510 Atherosclerotic heart disease of native coronary artery without angina pectoris: Secondary | ICD-10-CM | POA: Diagnosis not present

## 2015-09-13 DIAGNOSIS — I5032 Chronic diastolic (congestive) heart failure: Secondary | ICD-10-CM | POA: Diagnosis not present

## 2015-09-13 DIAGNOSIS — R0602 Shortness of breath: Secondary | ICD-10-CM | POA: Diagnosis not present

## 2015-09-13 DIAGNOSIS — I110 Hypertensive heart disease with heart failure: Secondary | ICD-10-CM | POA: Diagnosis not present

## 2015-09-13 DIAGNOSIS — I10 Essential (primary) hypertension: Secondary | ICD-10-CM | POA: Diagnosis not present

## 2015-09-13 DIAGNOSIS — R0600 Dyspnea, unspecified: Secondary | ICD-10-CM | POA: Diagnosis not present

## 2015-09-13 DIAGNOSIS — J449 Chronic obstructive pulmonary disease, unspecified: Secondary | ICD-10-CM | POA: Diagnosis not present

## 2015-09-13 DIAGNOSIS — R0789 Other chest pain: Secondary | ICD-10-CM | POA: Diagnosis not present

## 2015-09-14 DIAGNOSIS — R079 Chest pain, unspecified: Secondary | ICD-10-CM | POA: Diagnosis not present

## 2015-09-14 DIAGNOSIS — R0602 Shortness of breath: Secondary | ICD-10-CM | POA: Diagnosis not present

## 2015-09-14 DIAGNOSIS — R0600 Dyspnea, unspecified: Secondary | ICD-10-CM | POA: Diagnosis not present

## 2015-09-18 DIAGNOSIS — I5032 Chronic diastolic (congestive) heart failure: Secondary | ICD-10-CM | POA: Diagnosis not present

## 2015-09-18 DIAGNOSIS — M7989 Other specified soft tissue disorders: Secondary | ICD-10-CM | POA: Diagnosis not present

## 2015-09-18 DIAGNOSIS — Q245 Malformation of coronary vessels: Secondary | ICD-10-CM | POA: Diagnosis not present

## 2015-09-18 DIAGNOSIS — R0789 Other chest pain: Secondary | ICD-10-CM | POA: Diagnosis not present

## 2015-09-18 DIAGNOSIS — R0602 Shortness of breath: Secondary | ICD-10-CM | POA: Diagnosis not present

## 2015-09-18 DIAGNOSIS — I469 Cardiac arrest, cause unspecified: Secondary | ICD-10-CM | POA: Diagnosis not present

## 2015-09-18 DIAGNOSIS — F339 Major depressive disorder, recurrent, unspecified: Secondary | ICD-10-CM | POA: Diagnosis not present

## 2015-09-18 DIAGNOSIS — I10 Essential (primary) hypertension: Secondary | ICD-10-CM | POA: Diagnosis not present

## 2015-09-18 DIAGNOSIS — G4733 Obstructive sleep apnea (adult) (pediatric): Secondary | ICD-10-CM | POA: Diagnosis not present

## 2015-09-18 DIAGNOSIS — I2510 Atherosclerotic heart disease of native coronary artery without angina pectoris: Secondary | ICD-10-CM | POA: Diagnosis not present

## 2015-09-18 DIAGNOSIS — F17210 Nicotine dependence, cigarettes, uncomplicated: Secondary | ICD-10-CM | POA: Diagnosis not present

## 2015-09-18 DIAGNOSIS — E785 Hyperlipidemia, unspecified: Secondary | ICD-10-CM | POA: Diagnosis not present

## 2015-09-18 DIAGNOSIS — I5033 Acute on chronic diastolic (congestive) heart failure: Secondary | ICD-10-CM | POA: Diagnosis not present

## 2015-09-18 DIAGNOSIS — J45909 Unspecified asthma, uncomplicated: Secondary | ICD-10-CM | POA: Diagnosis not present

## 2015-09-18 DIAGNOSIS — Z720 Tobacco use: Secondary | ICD-10-CM | POA: Diagnosis not present

## 2015-09-18 DIAGNOSIS — I110 Hypertensive heart disease with heart failure: Secondary | ICD-10-CM | POA: Diagnosis not present

## 2015-09-18 DIAGNOSIS — Z6841 Body Mass Index (BMI) 40.0 and over, adult: Secondary | ICD-10-CM | POA: Diagnosis not present

## 2015-09-18 DIAGNOSIS — I509 Heart failure, unspecified: Secondary | ICD-10-CM | POA: Diagnosis not present

## 2015-09-18 DIAGNOSIS — I252 Old myocardial infarction: Secondary | ICD-10-CM | POA: Diagnosis not present

## 2015-09-18 DIAGNOSIS — R079 Chest pain, unspecified: Secondary | ICD-10-CM | POA: Diagnosis not present

## 2015-09-18 DIAGNOSIS — M79605 Pain in left leg: Secondary | ICD-10-CM | POA: Diagnosis not present

## 2015-09-18 DIAGNOSIS — I25110 Atherosclerotic heart disease of native coronary artery with unstable angina pectoris: Secondary | ICD-10-CM | POA: Diagnosis not present

## 2015-09-18 DIAGNOSIS — R0689 Other abnormalities of breathing: Secondary | ICD-10-CM | POA: Diagnosis not present

## 2015-09-18 DIAGNOSIS — I2589 Other forms of chronic ischemic heart disease: Secondary | ICD-10-CM | POA: Diagnosis not present

## 2015-09-18 DIAGNOSIS — R0600 Dyspnea, unspecified: Secondary | ICD-10-CM | POA: Diagnosis not present

## 2015-09-18 DIAGNOSIS — J449 Chronic obstructive pulmonary disease, unspecified: Secondary | ICD-10-CM | POA: Diagnosis not present

## 2015-09-18 DIAGNOSIS — I9789 Other postprocedural complications and disorders of the circulatory system, not elsewhere classified: Secondary | ICD-10-CM | POA: Diagnosis not present

## 2015-09-18 DIAGNOSIS — J441 Chronic obstructive pulmonary disease with (acute) exacerbation: Secondary | ICD-10-CM | POA: Diagnosis not present

## 2015-09-19 DIAGNOSIS — Q245 Malformation of coronary vessels: Secondary | ICD-10-CM | POA: Diagnosis not present

## 2015-09-19 DIAGNOSIS — I10 Essential (primary) hypertension: Secondary | ICD-10-CM | POA: Diagnosis not present

## 2015-09-19 DIAGNOSIS — R0789 Other chest pain: Secondary | ICD-10-CM | POA: Diagnosis not present

## 2015-09-19 DIAGNOSIS — J449 Chronic obstructive pulmonary disease, unspecified: Secondary | ICD-10-CM | POA: Diagnosis not present

## 2015-09-19 DIAGNOSIS — Z720 Tobacco use: Secondary | ICD-10-CM | POA: Diagnosis not present

## 2015-09-20 DIAGNOSIS — Z720 Tobacco use: Secondary | ICD-10-CM | POA: Diagnosis not present

## 2015-09-20 DIAGNOSIS — R0789 Other chest pain: Secondary | ICD-10-CM | POA: Diagnosis not present

## 2015-09-20 DIAGNOSIS — J811 Chronic pulmonary edema: Secondary | ICD-10-CM | POA: Diagnosis not present

## 2015-09-20 DIAGNOSIS — F339 Major depressive disorder, recurrent, unspecified: Secondary | ICD-10-CM | POA: Diagnosis not present

## 2015-09-20 DIAGNOSIS — I25110 Atherosclerotic heart disease of native coronary artery with unstable angina pectoris: Secondary | ICD-10-CM | POA: Diagnosis not present

## 2015-09-20 DIAGNOSIS — I10 Essential (primary) hypertension: Secondary | ICD-10-CM | POA: Diagnosis not present

## 2015-09-20 DIAGNOSIS — F3340 Major depressive disorder, recurrent, in remission, unspecified: Secondary | ICD-10-CM | POA: Diagnosis not present

## 2015-09-20 DIAGNOSIS — G8918 Other acute postprocedural pain: Secondary | ICD-10-CM | POA: Diagnosis not present

## 2015-09-20 DIAGNOSIS — F17210 Nicotine dependence, cigarettes, uncomplicated: Secondary | ICD-10-CM | POA: Diagnosis not present

## 2015-09-20 DIAGNOSIS — J90 Pleural effusion, not elsewhere classified: Secondary | ICD-10-CM | POA: Diagnosis not present

## 2015-09-20 DIAGNOSIS — J939 Pneumothorax, unspecified: Secondary | ICD-10-CM | POA: Diagnosis not present

## 2015-09-20 DIAGNOSIS — R0689 Other abnormalities of breathing: Secondary | ICD-10-CM | POA: Diagnosis not present

## 2015-09-20 DIAGNOSIS — I252 Old myocardial infarction: Secondary | ICD-10-CM | POA: Diagnosis not present

## 2015-09-20 DIAGNOSIS — I509 Heart failure, unspecified: Secondary | ICD-10-CM | POA: Diagnosis not present

## 2015-09-20 DIAGNOSIS — I5033 Acute on chronic diastolic (congestive) heart failure: Secondary | ICD-10-CM | POA: Diagnosis not present

## 2015-09-20 DIAGNOSIS — Z452 Encounter for adjustment and management of vascular access device: Secondary | ICD-10-CM | POA: Diagnosis not present

## 2015-09-20 DIAGNOSIS — I700 Atherosclerosis of aorta: Secondary | ICD-10-CM | POA: Diagnosis not present

## 2015-09-20 DIAGNOSIS — I2510 Atherosclerotic heart disease of native coronary artery without angina pectoris: Secondary | ICD-10-CM | POA: Diagnosis not present

## 2015-09-20 DIAGNOSIS — I5032 Chronic diastolic (congestive) heart failure: Secondary | ICD-10-CM | POA: Diagnosis not present

## 2015-09-20 DIAGNOSIS — I9789 Other postprocedural complications and disorders of the circulatory system, not elsewhere classified: Secondary | ICD-10-CM | POA: Diagnosis not present

## 2015-09-20 DIAGNOSIS — I110 Hypertensive heart disease with heart failure: Secondary | ICD-10-CM | POA: Diagnosis not present

## 2015-09-20 DIAGNOSIS — I2589 Other forms of chronic ischemic heart disease: Secondary | ICD-10-CM | POA: Diagnosis not present

## 2015-09-20 DIAGNOSIS — J441 Chronic obstructive pulmonary disease with (acute) exacerbation: Secondary | ICD-10-CM | POA: Diagnosis not present

## 2015-09-20 DIAGNOSIS — I517 Cardiomegaly: Secondary | ICD-10-CM | POA: Diagnosis not present

## 2015-09-20 DIAGNOSIS — J9811 Atelectasis: Secondary | ICD-10-CM | POA: Diagnosis not present

## 2015-09-20 DIAGNOSIS — E785 Hyperlipidemia, unspecified: Secondary | ICD-10-CM | POA: Diagnosis not present

## 2015-09-20 DIAGNOSIS — I25118 Atherosclerotic heart disease of native coronary artery with other forms of angina pectoris: Secondary | ICD-10-CM | POA: Diagnosis not present

## 2015-09-20 DIAGNOSIS — F411 Generalized anxiety disorder: Secondary | ICD-10-CM | POA: Diagnosis not present

## 2015-09-20 DIAGNOSIS — Z6841 Body Mass Index (BMI) 40.0 and over, adult: Secondary | ICD-10-CM | POA: Diagnosis not present

## 2015-09-20 DIAGNOSIS — Q245 Malformation of coronary vessels: Secondary | ICD-10-CM | POA: Diagnosis not present

## 2015-09-20 DIAGNOSIS — R918 Other nonspecific abnormal finding of lung field: Secondary | ICD-10-CM | POA: Diagnosis not present

## 2015-09-20 DIAGNOSIS — J449 Chronic obstructive pulmonary disease, unspecified: Secondary | ICD-10-CM | POA: Diagnosis not present

## 2015-09-20 DIAGNOSIS — R079 Chest pain, unspecified: Secondary | ICD-10-CM | POA: Diagnosis not present

## 2015-09-20 DIAGNOSIS — J948 Other specified pleural conditions: Secondary | ICD-10-CM | POA: Diagnosis not present

## 2015-09-20 DIAGNOSIS — I240 Acute coronary thrombosis not resulting in myocardial infarction: Secondary | ICD-10-CM | POA: Diagnosis not present

## 2015-09-20 DIAGNOSIS — J45909 Unspecified asthma, uncomplicated: Secondary | ICD-10-CM | POA: Diagnosis not present

## 2015-09-20 DIAGNOSIS — G4733 Obstructive sleep apnea (adult) (pediatric): Secondary | ICD-10-CM | POA: Diagnosis not present

## 2015-10-08 DIAGNOSIS — Z87891 Personal history of nicotine dependence: Secondary | ICD-10-CM | POA: Diagnosis not present

## 2015-10-08 DIAGNOSIS — Z79899 Other long term (current) drug therapy: Secondary | ICD-10-CM | POA: Diagnosis not present

## 2015-10-08 DIAGNOSIS — Z5181 Encounter for therapeutic drug level monitoring: Secondary | ICD-10-CM | POA: Diagnosis not present

## 2015-10-08 DIAGNOSIS — G8918 Other acute postprocedural pain: Secondary | ICD-10-CM | POA: Diagnosis not present

## 2015-10-08 DIAGNOSIS — J90 Pleural effusion, not elsewhere classified: Secondary | ICD-10-CM | POA: Diagnosis not present

## 2015-10-08 DIAGNOSIS — R52 Pain, unspecified: Secondary | ICD-10-CM | POA: Diagnosis not present

## 2015-10-08 DIAGNOSIS — R9431 Abnormal electrocardiogram [ECG] [EKG]: Secondary | ICD-10-CM | POA: Diagnosis not present

## 2015-10-08 DIAGNOSIS — R062 Wheezing: Secondary | ICD-10-CM | POA: Diagnosis not present

## 2015-10-08 DIAGNOSIS — R0789 Other chest pain: Secondary | ICD-10-CM | POA: Diagnosis not present

## 2015-10-08 DIAGNOSIS — R918 Other nonspecific abnormal finding of lung field: Secondary | ICD-10-CM | POA: Diagnosis not present

## 2015-10-08 DIAGNOSIS — Z951 Presence of aortocoronary bypass graft: Secondary | ICD-10-CM | POA: Diagnosis not present

## 2015-10-08 DIAGNOSIS — I517 Cardiomegaly: Secondary | ICD-10-CM | POA: Diagnosis not present

## 2015-10-12 DIAGNOSIS — I2589 Other forms of chronic ischemic heart disease: Secondary | ICD-10-CM | POA: Diagnosis not present

## 2015-10-12 DIAGNOSIS — G4733 Obstructive sleep apnea (adult) (pediatric): Secondary | ICD-10-CM | POA: Diagnosis not present

## 2015-10-12 DIAGNOSIS — J9601 Acute respiratory failure with hypoxia: Secondary | ICD-10-CM | POA: Diagnosis not present

## 2015-10-12 DIAGNOSIS — R079 Chest pain, unspecified: Secondary | ICD-10-CM | POA: Diagnosis not present

## 2015-10-12 DIAGNOSIS — I517 Cardiomegaly: Secondary | ICD-10-CM | POA: Diagnosis not present

## 2015-10-12 DIAGNOSIS — N179 Acute kidney failure, unspecified: Secondary | ICD-10-CM | POA: Diagnosis not present

## 2015-10-12 DIAGNOSIS — I25700 Atherosclerosis of coronary artery bypass graft(s), unspecified, with unstable angina pectoris: Secondary | ICD-10-CM | POA: Diagnosis not present

## 2015-10-12 DIAGNOSIS — F339 Major depressive disorder, recurrent, unspecified: Secondary | ICD-10-CM | POA: Diagnosis not present

## 2015-10-12 DIAGNOSIS — R2689 Other abnormalities of gait and mobility: Secondary | ICD-10-CM | POA: Diagnosis not present

## 2015-10-12 DIAGNOSIS — T40601A Poisoning by unspecified narcotics, accidental (unintentional), initial encounter: Secondary | ICD-10-CM | POA: Diagnosis not present

## 2015-10-12 DIAGNOSIS — I5042 Chronic combined systolic (congestive) and diastolic (congestive) heart failure: Secondary | ICD-10-CM | POA: Diagnosis not present

## 2015-10-12 DIAGNOSIS — I25118 Atherosclerotic heart disease of native coronary artery with other forms of angina pectoris: Secondary | ICD-10-CM | POA: Diagnosis not present

## 2015-10-12 DIAGNOSIS — I110 Hypertensive heart disease with heart failure: Secondary | ICD-10-CM | POA: Diagnosis not present

## 2015-10-12 DIAGNOSIS — I2510 Atherosclerotic heart disease of native coronary artery without angina pectoris: Secondary | ICD-10-CM | POA: Diagnosis not present

## 2015-10-12 DIAGNOSIS — I10 Essential (primary) hypertension: Secondary | ICD-10-CM | POA: Diagnosis not present

## 2015-10-12 DIAGNOSIS — M6281 Muscle weakness (generalized): Secondary | ICD-10-CM | POA: Diagnosis not present

## 2015-10-12 DIAGNOSIS — G92 Toxic encephalopathy: Secondary | ICD-10-CM | POA: Diagnosis not present

## 2015-10-12 DIAGNOSIS — I5023 Acute on chronic systolic (congestive) heart failure: Secondary | ICD-10-CM | POA: Diagnosis not present

## 2015-10-12 DIAGNOSIS — Z9181 History of falling: Secondary | ICD-10-CM | POA: Diagnosis not present

## 2015-10-12 DIAGNOSIS — R0789 Other chest pain: Secondary | ICD-10-CM | POA: Diagnosis not present

## 2015-10-12 DIAGNOSIS — G8918 Other acute postprocedural pain: Secondary | ICD-10-CM | POA: Diagnosis not present

## 2015-10-12 DIAGNOSIS — J441 Chronic obstructive pulmonary disease with (acute) exacerbation: Secondary | ICD-10-CM | POA: Diagnosis not present

## 2015-10-12 DIAGNOSIS — R4182 Altered mental status, unspecified: Secondary | ICD-10-CM | POA: Diagnosis not present

## 2015-10-12 DIAGNOSIS — Q245 Malformation of coronary vessels: Secondary | ICD-10-CM | POA: Diagnosis not present

## 2015-10-12 DIAGNOSIS — J449 Chronic obstructive pulmonary disease, unspecified: Secondary | ICD-10-CM | POA: Diagnosis not present

## 2015-10-12 DIAGNOSIS — Z951 Presence of aortocoronary bypass graft: Secondary | ICD-10-CM | POA: Diagnosis not present

## 2015-10-12 DIAGNOSIS — R918 Other nonspecific abnormal finding of lung field: Secondary | ICD-10-CM | POA: Diagnosis not present

## 2015-10-12 DIAGNOSIS — M6250 Muscle wasting and atrophy, not elsewhere classified, unspecified site: Secondary | ICD-10-CM | POA: Diagnosis not present

## 2015-10-16 DIAGNOSIS — M6281 Muscle weakness (generalized): Secondary | ICD-10-CM | POA: Diagnosis not present

## 2015-10-16 DIAGNOSIS — I25700 Atherosclerosis of coronary artery bypass graft(s), unspecified, with unstable angina pectoris: Secondary | ICD-10-CM | POA: Diagnosis not present

## 2015-10-16 DIAGNOSIS — I5042 Chronic combined systolic (congestive) and diastolic (congestive) heart failure: Secondary | ICD-10-CM | POA: Diagnosis not present

## 2015-10-16 DIAGNOSIS — J441 Chronic obstructive pulmonary disease with (acute) exacerbation: Secondary | ICD-10-CM | POA: Diagnosis not present

## 2015-10-16 DIAGNOSIS — G92 Toxic encephalopathy: Secondary | ICD-10-CM | POA: Diagnosis not present

## 2015-10-16 DIAGNOSIS — T50901A Poisoning by unspecified drugs, medicaments and biological substances, accidental (unintentional), initial encounter: Secondary | ICD-10-CM | POA: Diagnosis not present

## 2015-10-16 DIAGNOSIS — N179 Acute kidney failure, unspecified: Secondary | ICD-10-CM | POA: Diagnosis not present

## 2015-10-16 DIAGNOSIS — I10 Essential (primary) hypertension: Secondary | ICD-10-CM | POA: Diagnosis not present

## 2015-10-16 DIAGNOSIS — T50901D Poisoning by unspecified drugs, medicaments and biological substances, accidental (unintentional), subsequent encounter: Secondary | ICD-10-CM | POA: Diagnosis not present

## 2015-10-16 DIAGNOSIS — I2589 Other forms of chronic ischemic heart disease: Secondary | ICD-10-CM | POA: Diagnosis not present

## 2015-10-16 DIAGNOSIS — G8918 Other acute postprocedural pain: Secondary | ICD-10-CM | POA: Diagnosis not present

## 2015-10-16 DIAGNOSIS — Z9181 History of falling: Secondary | ICD-10-CM | POA: Diagnosis not present

## 2015-10-16 DIAGNOSIS — M6250 Muscle wasting and atrophy, not elsewhere classified, unspecified site: Secondary | ICD-10-CM | POA: Diagnosis not present

## 2015-10-16 DIAGNOSIS — G4733 Obstructive sleep apnea (adult) (pediatric): Secondary | ICD-10-CM | POA: Diagnosis not present

## 2015-10-16 DIAGNOSIS — R2689 Other abnormalities of gait and mobility: Secondary | ICD-10-CM | POA: Diagnosis not present

## 2015-10-16 DIAGNOSIS — Z951 Presence of aortocoronary bypass graft: Secondary | ICD-10-CM | POA: Diagnosis not present

## 2015-10-16 DIAGNOSIS — T40601A Poisoning by unspecified narcotics, accidental (unintentional), initial encounter: Secondary | ICD-10-CM | POA: Diagnosis not present

## 2015-10-16 DIAGNOSIS — I25118 Atherosclerotic heart disease of native coronary artery with other forms of angina pectoris: Secondary | ICD-10-CM | POA: Diagnosis not present

## 2015-10-17 DIAGNOSIS — T50901A Poisoning by unspecified drugs, medicaments and biological substances, accidental (unintentional), initial encounter: Secondary | ICD-10-CM | POA: Diagnosis not present

## 2015-10-17 DIAGNOSIS — M6281 Muscle weakness (generalized): Secondary | ICD-10-CM | POA: Diagnosis not present

## 2015-10-20 DIAGNOSIS — T50901D Poisoning by unspecified drugs, medicaments and biological substances, accidental (unintentional), subsequent encounter: Secondary | ICD-10-CM | POA: Diagnosis not present

## 2015-10-20 DIAGNOSIS — M6281 Muscle weakness (generalized): Secondary | ICD-10-CM | POA: Diagnosis not present

## 2015-10-21 DIAGNOSIS — M6281 Muscle weakness (generalized): Secondary | ICD-10-CM | POA: Diagnosis not present

## 2015-10-21 DIAGNOSIS — T50901D Poisoning by unspecified drugs, medicaments and biological substances, accidental (unintentional), subsequent encounter: Secondary | ICD-10-CM | POA: Diagnosis not present

## 2015-10-22 DIAGNOSIS — F339 Major depressive disorder, recurrent, unspecified: Secondary | ICD-10-CM | POA: Diagnosis not present

## 2015-10-22 DIAGNOSIS — I4891 Unspecified atrial fibrillation: Secondary | ICD-10-CM | POA: Diagnosis not present

## 2015-10-22 DIAGNOSIS — J9811 Atelectasis: Secondary | ICD-10-CM | POA: Diagnosis not present

## 2015-10-22 DIAGNOSIS — E43 Unspecified severe protein-calorie malnutrition: Secondary | ICD-10-CM | POA: Diagnosis not present

## 2015-10-22 DIAGNOSIS — Z951 Presence of aortocoronary bypass graft: Secondary | ICD-10-CM | POA: Diagnosis not present

## 2015-10-22 DIAGNOSIS — R918 Other nonspecific abnormal finding of lung field: Secondary | ICD-10-CM | POA: Diagnosis not present

## 2015-10-22 DIAGNOSIS — T814XXA Infection following a procedure, initial encounter: Secondary | ICD-10-CM | POA: Diagnosis not present

## 2015-10-22 DIAGNOSIS — Z481 Encounter for planned postprocedural wound closure: Secondary | ICD-10-CM | POA: Diagnosis not present

## 2015-10-22 DIAGNOSIS — Q245 Malformation of coronary vessels: Secondary | ICD-10-CM | POA: Diagnosis not present

## 2015-10-22 DIAGNOSIS — M9669 Fracture of other bone following insertion of orthopedic implant, joint prosthesis, or bone plate: Secondary | ICD-10-CM | POA: Diagnosis not present

## 2015-10-22 DIAGNOSIS — I509 Heart failure, unspecified: Secondary | ICD-10-CM | POA: Diagnosis not present

## 2015-10-22 DIAGNOSIS — R52 Pain, unspecified: Secondary | ICD-10-CM | POA: Diagnosis not present

## 2015-10-22 DIAGNOSIS — T8579XA Infection and inflammatory reaction due to other internal prosthetic devices, implants and grafts, initial encounter: Secondary | ICD-10-CM | POA: Diagnosis not present

## 2015-10-22 DIAGNOSIS — Z4682 Encounter for fitting and adjustment of non-vascular catheter: Secondary | ICD-10-CM | POA: Diagnosis not present

## 2015-10-22 DIAGNOSIS — D509 Iron deficiency anemia, unspecified: Secondary | ICD-10-CM | POA: Diagnosis not present

## 2015-10-22 DIAGNOSIS — R9431 Abnormal electrocardiogram [ECG] [EKG]: Secondary | ICD-10-CM | POA: Diagnosis not present

## 2015-10-22 DIAGNOSIS — D62 Acute posthemorrhagic anemia: Secondary | ICD-10-CM | POA: Diagnosis not present

## 2015-10-22 DIAGNOSIS — I361 Nonrheumatic tricuspid (valve) insufficiency: Secondary | ICD-10-CM | POA: Diagnosis not present

## 2015-10-22 DIAGNOSIS — I340 Nonrheumatic mitral (valve) insufficiency: Secondary | ICD-10-CM | POA: Diagnosis not present

## 2015-10-22 DIAGNOSIS — B9562 Methicillin resistant Staphylococcus aureus infection as the cause of diseases classified elsewhere: Secondary | ICD-10-CM | POA: Diagnosis not present

## 2015-10-22 DIAGNOSIS — J9600 Acute respiratory failure, unspecified whether with hypoxia or hypercapnia: Secondary | ICD-10-CM | POA: Diagnosis not present

## 2015-10-22 DIAGNOSIS — A499 Bacterial infection, unspecified: Secondary | ICD-10-CM | POA: Diagnosis not present

## 2015-10-22 DIAGNOSIS — I313 Pericardial effusion (noninflammatory): Secondary | ICD-10-CM | POA: Diagnosis not present

## 2015-10-22 DIAGNOSIS — J449 Chronic obstructive pulmonary disease, unspecified: Secondary | ICD-10-CM | POA: Diagnosis not present

## 2015-10-22 DIAGNOSIS — M869 Osteomyelitis, unspecified: Secondary | ICD-10-CM | POA: Diagnosis not present

## 2015-10-22 DIAGNOSIS — I2510 Atherosclerotic heart disease of native coronary artery without angina pectoris: Secondary | ICD-10-CM | POA: Diagnosis not present

## 2015-10-22 DIAGNOSIS — Z9181 History of falling: Secondary | ICD-10-CM | POA: Diagnosis not present

## 2015-10-22 DIAGNOSIS — R0789 Other chest pain: Secondary | ICD-10-CM | POA: Diagnosis not present

## 2015-10-22 DIAGNOSIS — J42 Unspecified chronic bronchitis: Secondary | ICD-10-CM | POA: Diagnosis not present

## 2015-10-22 DIAGNOSIS — I517 Cardiomegaly: Secondary | ICD-10-CM | POA: Diagnosis not present

## 2015-10-22 DIAGNOSIS — T8132XD Disruption of internal operation (surgical) wound, not elsewhere classified, subsequent encounter: Secondary | ICD-10-CM | POA: Diagnosis not present

## 2015-10-22 DIAGNOSIS — Z9889 Other specified postprocedural states: Secondary | ICD-10-CM | POA: Diagnosis not present

## 2015-10-22 DIAGNOSIS — I4892 Unspecified atrial flutter: Secondary | ICD-10-CM | POA: Diagnosis not present

## 2015-10-22 DIAGNOSIS — R208 Other disturbances of skin sensation: Secondary | ICD-10-CM | POA: Diagnosis not present

## 2015-10-22 DIAGNOSIS — T814XXD Infection following a procedure, subsequent encounter: Secondary | ICD-10-CM | POA: Diagnosis not present

## 2015-10-22 DIAGNOSIS — Z452 Encounter for adjustment and management of vascular access device: Secondary | ICD-10-CM | POA: Diagnosis not present

## 2015-10-22 DIAGNOSIS — I10 Essential (primary) hypertension: Secondary | ICD-10-CM | POA: Diagnosis not present

## 2015-10-22 DIAGNOSIS — J811 Chronic pulmonary edema: Secondary | ICD-10-CM | POA: Diagnosis not present

## 2015-10-22 DIAGNOSIS — J90 Pleural effusion, not elsewhere classified: Secondary | ICD-10-CM | POA: Diagnosis not present

## 2015-10-22 DIAGNOSIS — Z48812 Encounter for surgical aftercare following surgery on the circulatory system: Secondary | ICD-10-CM | POA: Diagnosis not present

## 2015-10-22 DIAGNOSIS — R079 Chest pain, unspecified: Secondary | ICD-10-CM | POA: Diagnosis not present

## 2015-10-22 DIAGNOSIS — G8918 Other acute postprocedural pain: Secondary | ICD-10-CM | POA: Diagnosis not present

## 2015-10-22 DIAGNOSIS — Z22322 Carrier or suspected carrier of Methicillin resistant Staphylococcus aureus: Secondary | ICD-10-CM | POA: Diagnosis not present

## 2015-10-22 DIAGNOSIS — R7881 Bacteremia: Secondary | ICD-10-CM | POA: Diagnosis not present

## 2015-11-04 DIAGNOSIS — D509 Iron deficiency anemia, unspecified: Secondary | ICD-10-CM | POA: Diagnosis not present

## 2015-11-04 DIAGNOSIS — R52 Pain, unspecified: Secondary | ICD-10-CM | POA: Diagnosis not present

## 2015-11-04 DIAGNOSIS — I509 Heart failure, unspecified: Secondary | ICD-10-CM | POA: Diagnosis not present

## 2015-11-04 DIAGNOSIS — I10 Essential (primary) hypertension: Secondary | ICD-10-CM | POA: Diagnosis not present

## 2015-11-04 DIAGNOSIS — Z9181 History of falling: Secondary | ICD-10-CM | POA: Diagnosis not present

## 2015-11-04 DIAGNOSIS — F331 Major depressive disorder, recurrent, moderate: Secondary | ICD-10-CM | POA: Diagnosis not present

## 2015-11-04 DIAGNOSIS — F0632 Mood disorder due to known physiological condition with major depressive-like episode: Secondary | ICD-10-CM | POA: Diagnosis not present

## 2015-11-04 DIAGNOSIS — I5030 Unspecified diastolic (congestive) heart failure: Secondary | ICD-10-CM | POA: Diagnosis not present

## 2015-11-04 DIAGNOSIS — I5020 Unspecified systolic (congestive) heart failure: Secondary | ICD-10-CM | POA: Diagnosis not present

## 2015-11-04 DIAGNOSIS — J90 Pleural effusion, not elsewhere classified: Secondary | ICD-10-CM | POA: Diagnosis not present

## 2015-11-04 DIAGNOSIS — K219 Gastro-esophageal reflux disease without esophagitis: Secondary | ICD-10-CM | POA: Diagnosis not present

## 2015-11-04 DIAGNOSIS — B9562 Methicillin resistant Staphylococcus aureus infection as the cause of diseases classified elsewhere: Secondary | ICD-10-CM | POA: Diagnosis not present

## 2015-11-04 DIAGNOSIS — I25810 Atherosclerosis of coronary artery bypass graft(s) without angina pectoris: Secondary | ICD-10-CM | POA: Diagnosis not present

## 2015-11-04 DIAGNOSIS — I2510 Atherosclerotic heart disease of native coronary artery without angina pectoris: Secondary | ICD-10-CM | POA: Diagnosis not present

## 2015-11-04 DIAGNOSIS — I482 Chronic atrial fibrillation: Secondary | ICD-10-CM | POA: Diagnosis not present

## 2015-11-04 DIAGNOSIS — R918 Other nonspecific abnormal finding of lung field: Secondary | ICD-10-CM | POA: Diagnosis not present

## 2015-11-04 DIAGNOSIS — I5033 Acute on chronic diastolic (congestive) heart failure: Secondary | ICD-10-CM | POA: Diagnosis not present

## 2015-11-04 DIAGNOSIS — J42 Unspecified chronic bronchitis: Secondary | ICD-10-CM | POA: Diagnosis not present

## 2015-11-04 DIAGNOSIS — I517 Cardiomegaly: Secondary | ICD-10-CM | POA: Diagnosis not present

## 2015-11-04 DIAGNOSIS — F339 Major depressive disorder, recurrent, unspecified: Secondary | ICD-10-CM | POA: Diagnosis not present

## 2015-11-04 DIAGNOSIS — F064 Anxiety disorder due to known physiological condition: Secondary | ICD-10-CM | POA: Diagnosis not present

## 2015-11-04 DIAGNOSIS — J441 Chronic obstructive pulmonary disease with (acute) exacerbation: Secondary | ICD-10-CM | POA: Diagnosis not present

## 2015-11-04 DIAGNOSIS — J9811 Atelectasis: Secondary | ICD-10-CM | POA: Diagnosis not present

## 2015-11-04 DIAGNOSIS — R7881 Bacteremia: Secondary | ICD-10-CM | POA: Diagnosis not present

## 2015-11-04 DIAGNOSIS — Z7982 Long term (current) use of aspirin: Secondary | ICD-10-CM | POA: Diagnosis not present

## 2015-11-04 DIAGNOSIS — R531 Weakness: Secondary | ICD-10-CM | POA: Diagnosis not present

## 2015-11-04 DIAGNOSIS — Z951 Presence of aortocoronary bypass graft: Secondary | ICD-10-CM | POA: Diagnosis not present

## 2015-11-04 DIAGNOSIS — J449 Chronic obstructive pulmonary disease, unspecified: Secondary | ICD-10-CM | POA: Diagnosis not present

## 2015-11-04 DIAGNOSIS — Z452 Encounter for adjustment and management of vascular access device: Secondary | ICD-10-CM | POA: Diagnosis not present

## 2015-11-04 DIAGNOSIS — Z48812 Encounter for surgical aftercare following surgery on the circulatory system: Secondary | ICD-10-CM | POA: Diagnosis not present

## 2015-11-04 DIAGNOSIS — I4891 Unspecified atrial fibrillation: Secondary | ICD-10-CM | POA: Diagnosis not present

## 2015-11-04 DIAGNOSIS — E43 Unspecified severe protein-calorie malnutrition: Secondary | ICD-10-CM | POA: Diagnosis not present

## 2015-11-04 DIAGNOSIS — F419 Anxiety disorder, unspecified: Secondary | ICD-10-CM | POA: Diagnosis not present

## 2015-11-11 DIAGNOSIS — K219 Gastro-esophageal reflux disease without esophagitis: Secondary | ICD-10-CM | POA: Diagnosis not present

## 2015-11-11 DIAGNOSIS — R52 Pain, unspecified: Secondary | ICD-10-CM | POA: Diagnosis not present

## 2015-11-11 DIAGNOSIS — R531 Weakness: Secondary | ICD-10-CM | POA: Diagnosis not present

## 2015-11-11 DIAGNOSIS — J449 Chronic obstructive pulmonary disease, unspecified: Secondary | ICD-10-CM | POA: Diagnosis not present

## 2015-11-11 DIAGNOSIS — I5030 Unspecified diastolic (congestive) heart failure: Secondary | ICD-10-CM | POA: Diagnosis not present

## 2015-11-11 DIAGNOSIS — I2510 Atherosclerotic heart disease of native coronary artery without angina pectoris: Secondary | ICD-10-CM | POA: Diagnosis not present

## 2015-11-11 DIAGNOSIS — I25810 Atherosclerosis of coronary artery bypass graft(s) without angina pectoris: Secondary | ICD-10-CM | POA: Diagnosis not present

## 2015-11-11 DIAGNOSIS — I10 Essential (primary) hypertension: Secondary | ICD-10-CM | POA: Diagnosis not present

## 2015-11-13 DIAGNOSIS — R918 Other nonspecific abnormal finding of lung field: Secondary | ICD-10-CM | POA: Diagnosis not present

## 2015-11-13 DIAGNOSIS — Z7982 Long term (current) use of aspirin: Secondary | ICD-10-CM | POA: Diagnosis not present

## 2015-11-13 DIAGNOSIS — J90 Pleural effusion, not elsewhere classified: Secondary | ICD-10-CM | POA: Diagnosis not present

## 2015-11-13 DIAGNOSIS — I5033 Acute on chronic diastolic (congestive) heart failure: Secondary | ICD-10-CM | POA: Diagnosis not present

## 2015-11-13 DIAGNOSIS — J9811 Atelectasis: Secondary | ICD-10-CM | POA: Diagnosis not present

## 2015-11-13 DIAGNOSIS — I517 Cardiomegaly: Secondary | ICD-10-CM | POA: Diagnosis not present

## 2015-11-13 DIAGNOSIS — Z452 Encounter for adjustment and management of vascular access device: Secondary | ICD-10-CM | POA: Diagnosis not present

## 2015-11-13 DIAGNOSIS — Z48812 Encounter for surgical aftercare following surgery on the circulatory system: Secondary | ICD-10-CM | POA: Diagnosis not present

## 2015-11-14 DIAGNOSIS — I25810 Atherosclerosis of coronary artery bypass graft(s) without angina pectoris: Secondary | ICD-10-CM | POA: Diagnosis not present

## 2015-11-14 DIAGNOSIS — F064 Anxiety disorder due to known physiological condition: Secondary | ICD-10-CM | POA: Diagnosis not present

## 2015-11-14 DIAGNOSIS — F331 Major depressive disorder, recurrent, moderate: Secondary | ICD-10-CM | POA: Diagnosis not present

## 2015-11-17 DIAGNOSIS — I482 Chronic atrial fibrillation: Secondary | ICD-10-CM | POA: Diagnosis not present

## 2015-11-17 DIAGNOSIS — I10 Essential (primary) hypertension: Secondary | ICD-10-CM | POA: Diagnosis not present

## 2015-11-17 DIAGNOSIS — J441 Chronic obstructive pulmonary disease with (acute) exacerbation: Secondary | ICD-10-CM | POA: Diagnosis not present

## 2015-11-17 DIAGNOSIS — I2510 Atherosclerotic heart disease of native coronary artery without angina pectoris: Secondary | ICD-10-CM | POA: Diagnosis not present

## 2015-11-17 DIAGNOSIS — B9562 Methicillin resistant Staphylococcus aureus infection as the cause of diseases classified elsewhere: Secondary | ICD-10-CM | POA: Diagnosis not present

## 2015-11-17 DIAGNOSIS — I5020 Unspecified systolic (congestive) heart failure: Secondary | ICD-10-CM | POA: Diagnosis not present

## 2015-11-17 DIAGNOSIS — F419 Anxiety disorder, unspecified: Secondary | ICD-10-CM | POA: Diagnosis not present

## 2015-11-18 DIAGNOSIS — J449 Chronic obstructive pulmonary disease, unspecified: Secondary | ICD-10-CM | POA: Diagnosis not present

## 2015-11-18 DIAGNOSIS — I110 Hypertensive heart disease with heart failure: Secondary | ICD-10-CM | POA: Diagnosis not present

## 2015-11-18 DIAGNOSIS — I4891 Unspecified atrial fibrillation: Secondary | ICD-10-CM | POA: Diagnosis not present

## 2015-11-18 DIAGNOSIS — D509 Iron deficiency anemia, unspecified: Secondary | ICD-10-CM | POA: Diagnosis not present

## 2015-11-18 DIAGNOSIS — I5032 Chronic diastolic (congestive) heart failure: Secondary | ICD-10-CM | POA: Diagnosis not present

## 2015-11-18 DIAGNOSIS — R7881 Bacteremia: Secondary | ICD-10-CM | POA: Diagnosis not present

## 2015-11-18 DIAGNOSIS — I2510 Atherosclerotic heart disease of native coronary artery without angina pectoris: Secondary | ICD-10-CM | POA: Diagnosis not present

## 2015-11-18 DIAGNOSIS — T814XXA Infection following a procedure, initial encounter: Secondary | ICD-10-CM | POA: Diagnosis not present

## 2015-11-18 DIAGNOSIS — B9562 Methicillin resistant Staphylococcus aureus infection as the cause of diseases classified elsewhere: Secondary | ICD-10-CM | POA: Diagnosis not present

## 2015-11-18 DIAGNOSIS — J441 Chronic obstructive pulmonary disease with (acute) exacerbation: Secondary | ICD-10-CM | POA: Diagnosis not present

## 2015-11-19 DIAGNOSIS — J441 Chronic obstructive pulmonary disease with (acute) exacerbation: Secondary | ICD-10-CM | POA: Diagnosis not present

## 2015-11-19 DIAGNOSIS — I509 Heart failure, unspecified: Secondary | ICD-10-CM | POA: Diagnosis not present

## 2015-11-20 DIAGNOSIS — J441 Chronic obstructive pulmonary disease with (acute) exacerbation: Secondary | ICD-10-CM | POA: Diagnosis not present

## 2015-11-20 DIAGNOSIS — Z5189 Encounter for other specified aftercare: Secondary | ICD-10-CM | POA: Diagnosis not present

## 2015-11-21 DIAGNOSIS — J441 Chronic obstructive pulmonary disease with (acute) exacerbation: Secondary | ICD-10-CM | POA: Diagnosis not present

## 2015-11-21 DIAGNOSIS — Z79899 Other long term (current) drug therapy: Secondary | ICD-10-CM | POA: Diagnosis not present

## 2015-11-22 DIAGNOSIS — J441 Chronic obstructive pulmonary disease with (acute) exacerbation: Secondary | ICD-10-CM | POA: Diagnosis not present

## 2015-11-24 DIAGNOSIS — J441 Chronic obstructive pulmonary disease with (acute) exacerbation: Secondary | ICD-10-CM | POA: Diagnosis not present

## 2015-11-24 DIAGNOSIS — R0689 Other abnormalities of breathing: Secondary | ICD-10-CM | POA: Diagnosis not present

## 2015-11-25 DIAGNOSIS — T814XXD Infection following a procedure, subsequent encounter: Secondary | ICD-10-CM | POA: Diagnosis not present

## 2015-11-25 DIAGNOSIS — Z792 Long term (current) use of antibiotics: Secondary | ICD-10-CM | POA: Diagnosis not present

## 2015-11-25 DIAGNOSIS — M545 Low back pain: Secondary | ICD-10-CM | POA: Diagnosis not present

## 2015-11-25 DIAGNOSIS — I25118 Atherosclerotic heart disease of native coronary artery with other forms of angina pectoris: Secondary | ICD-10-CM | POA: Diagnosis not present

## 2015-11-25 DIAGNOSIS — G4733 Obstructive sleep apnea (adult) (pediatric): Secondary | ICD-10-CM | POA: Diagnosis not present

## 2015-11-25 DIAGNOSIS — Z951 Presence of aortocoronary bypass graft: Secondary | ICD-10-CM | POA: Diagnosis not present

## 2015-11-25 DIAGNOSIS — J441 Chronic obstructive pulmonary disease with (acute) exacerbation: Secondary | ICD-10-CM | POA: Diagnosis not present

## 2015-11-25 DIAGNOSIS — I5032 Chronic diastolic (congestive) heart failure: Secondary | ICD-10-CM | POA: Diagnosis not present

## 2015-11-25 DIAGNOSIS — I110 Hypertensive heart disease with heart failure: Secondary | ICD-10-CM | POA: Diagnosis not present

## 2015-11-25 DIAGNOSIS — F339 Major depressive disorder, recurrent, unspecified: Secondary | ICD-10-CM | POA: Diagnosis not present

## 2015-11-25 DIAGNOSIS — R627 Adult failure to thrive: Secondary | ICD-10-CM | POA: Diagnosis not present

## 2015-11-25 DIAGNOSIS — F3340 Major depressive disorder, recurrent, in remission, unspecified: Secondary | ICD-10-CM | POA: Diagnosis not present

## 2015-11-25 DIAGNOSIS — R531 Weakness: Secondary | ICD-10-CM | POA: Diagnosis not present

## 2015-11-25 DIAGNOSIS — R112 Nausea with vomiting, unspecified: Secondary | ICD-10-CM | POA: Diagnosis not present

## 2015-11-25 DIAGNOSIS — R7881 Bacteremia: Secondary | ICD-10-CM | POA: Diagnosis not present

## 2015-11-25 DIAGNOSIS — T368X5A Adverse effect of other systemic antibiotics, initial encounter: Secondary | ICD-10-CM | POA: Diagnosis not present

## 2015-11-25 DIAGNOSIS — E440 Moderate protein-calorie malnutrition: Secondary | ICD-10-CM | POA: Diagnosis not present

## 2015-11-25 DIAGNOSIS — T814XXA Infection following a procedure, initial encounter: Secondary | ICD-10-CM | POA: Diagnosis not present

## 2015-11-25 DIAGNOSIS — F411 Generalized anxiety disorder: Secondary | ICD-10-CM | POA: Diagnosis not present

## 2015-11-25 DIAGNOSIS — I10 Essential (primary) hypertension: Secondary | ICD-10-CM | POA: Diagnosis not present

## 2015-11-25 DIAGNOSIS — J449 Chronic obstructive pulmonary disease, unspecified: Secondary | ICD-10-CM | POA: Diagnosis not present

## 2015-11-25 DIAGNOSIS — N179 Acute kidney failure, unspecified: Secondary | ICD-10-CM | POA: Diagnosis not present

## 2015-11-26 DIAGNOSIS — J441 Chronic obstructive pulmonary disease with (acute) exacerbation: Secondary | ICD-10-CM | POA: Diagnosis not present

## 2015-11-27 DIAGNOSIS — J441 Chronic obstructive pulmonary disease with (acute) exacerbation: Secondary | ICD-10-CM | POA: Diagnosis not present

## 2015-11-28 DIAGNOSIS — J441 Chronic obstructive pulmonary disease with (acute) exacerbation: Secondary | ICD-10-CM | POA: Diagnosis not present

## 2015-12-02 DIAGNOSIS — R7881 Bacteremia: Secondary | ICD-10-CM | POA: Diagnosis not present

## 2015-12-02 DIAGNOSIS — J441 Chronic obstructive pulmonary disease with (acute) exacerbation: Secondary | ICD-10-CM | POA: Diagnosis not present

## 2015-12-03 DIAGNOSIS — R7881 Bacteremia: Secondary | ICD-10-CM | POA: Diagnosis not present

## 2015-12-03 DIAGNOSIS — J441 Chronic obstructive pulmonary disease with (acute) exacerbation: Secondary | ICD-10-CM | POA: Diagnosis not present

## 2015-12-04 DIAGNOSIS — R7881 Bacteremia: Secondary | ICD-10-CM | POA: Diagnosis not present

## 2015-12-04 DIAGNOSIS — J441 Chronic obstructive pulmonary disease with (acute) exacerbation: Secondary | ICD-10-CM | POA: Diagnosis not present

## 2015-12-05 DIAGNOSIS — J441 Chronic obstructive pulmonary disease with (acute) exacerbation: Secondary | ICD-10-CM | POA: Diagnosis not present

## 2015-12-05 DIAGNOSIS — R7881 Bacteremia: Secondary | ICD-10-CM | POA: Diagnosis not present

## 2015-12-06 DIAGNOSIS — I5032 Chronic diastolic (congestive) heart failure: Secondary | ICD-10-CM | POA: Diagnosis not present

## 2015-12-06 DIAGNOSIS — I4891 Unspecified atrial fibrillation: Secondary | ICD-10-CM | POA: Diagnosis not present

## 2015-12-06 DIAGNOSIS — R7881 Bacteremia: Secondary | ICD-10-CM | POA: Diagnosis not present

## 2015-12-06 DIAGNOSIS — I2510 Atherosclerotic heart disease of native coronary artery without angina pectoris: Secondary | ICD-10-CM | POA: Diagnosis not present

## 2015-12-06 DIAGNOSIS — T814XXA Infection following a procedure, initial encounter: Secondary | ICD-10-CM | POA: Diagnosis not present

## 2015-12-06 DIAGNOSIS — J449 Chronic obstructive pulmonary disease, unspecified: Secondary | ICD-10-CM | POA: Diagnosis not present

## 2015-12-06 DIAGNOSIS — J441 Chronic obstructive pulmonary disease with (acute) exacerbation: Secondary | ICD-10-CM | POA: Diagnosis not present

## 2015-12-06 DIAGNOSIS — D509 Iron deficiency anemia, unspecified: Secondary | ICD-10-CM | POA: Diagnosis not present

## 2015-12-06 DIAGNOSIS — I110 Hypertensive heart disease with heart failure: Secondary | ICD-10-CM | POA: Diagnosis not present

## 2015-12-06 DIAGNOSIS — B9562 Methicillin resistant Staphylococcus aureus infection as the cause of diseases classified elsewhere: Secondary | ICD-10-CM | POA: Diagnosis not present

## 2015-12-07 DIAGNOSIS — R7881 Bacteremia: Secondary | ICD-10-CM | POA: Diagnosis not present

## 2015-12-07 DIAGNOSIS — K529 Noninfective gastroenteritis and colitis, unspecified: Secondary | ICD-10-CM | POA: Diagnosis not present

## 2015-12-07 DIAGNOSIS — R1031 Right lower quadrant pain: Secondary | ICD-10-CM | POA: Diagnosis not present

## 2015-12-07 DIAGNOSIS — I110 Hypertensive heart disease with heart failure: Secondary | ICD-10-CM | POA: Diagnosis not present

## 2015-12-07 DIAGNOSIS — I2510 Atherosclerotic heart disease of native coronary artery without angina pectoris: Secondary | ICD-10-CM | POA: Diagnosis not present

## 2015-12-07 DIAGNOSIS — R918 Other nonspecific abnormal finding of lung field: Secondary | ICD-10-CM | POA: Diagnosis not present

## 2015-12-07 DIAGNOSIS — R935 Abnormal findings on diagnostic imaging of other abdominal regions, including retroperitoneum: Secondary | ICD-10-CM | POA: Diagnosis not present

## 2015-12-07 DIAGNOSIS — K55059 Acute (reversible) ischemia of intestine, part and extent unspecified: Secondary | ICD-10-CM | POA: Diagnosis not present

## 2015-12-07 DIAGNOSIS — R112 Nausea with vomiting, unspecified: Secondary | ICD-10-CM | POA: Diagnosis not present

## 2015-12-07 DIAGNOSIS — N179 Acute kidney failure, unspecified: Secondary | ICD-10-CM | POA: Diagnosis not present

## 2015-12-07 DIAGNOSIS — Z951 Presence of aortocoronary bypass graft: Secondary | ICD-10-CM | POA: Diagnosis not present

## 2015-12-07 DIAGNOSIS — I469 Cardiac arrest, cause unspecified: Secondary | ICD-10-CM | POA: Diagnosis not present

## 2015-12-07 DIAGNOSIS — J449 Chronic obstructive pulmonary disease, unspecified: Secondary | ICD-10-CM | POA: Diagnosis not present

## 2015-12-07 DIAGNOSIS — I517 Cardiomegaly: Secondary | ICD-10-CM | POA: Diagnosis not present

## 2015-12-07 DIAGNOSIS — R102 Pelvic and perineal pain: Secondary | ICD-10-CM | POA: Diagnosis not present

## 2015-12-07 DIAGNOSIS — F411 Generalized anxiety disorder: Secondary | ICD-10-CM | POA: Diagnosis not present

## 2015-12-07 DIAGNOSIS — I5032 Chronic diastolic (congestive) heart failure: Secondary | ICD-10-CM | POA: Diagnosis not present

## 2015-12-07 DIAGNOSIS — J441 Chronic obstructive pulmonary disease with (acute) exacerbation: Secondary | ICD-10-CM | POA: Diagnosis not present

## 2015-12-07 DIAGNOSIS — K6389 Other specified diseases of intestine: Secondary | ICD-10-CM | POA: Diagnosis not present

## 2015-12-07 DIAGNOSIS — L539 Erythematous condition, unspecified: Secondary | ICD-10-CM | POA: Diagnosis not present

## 2015-12-07 DIAGNOSIS — R197 Diarrhea, unspecified: Secondary | ICD-10-CM | POA: Diagnosis not present

## 2015-12-07 DIAGNOSIS — R188 Other ascites: Secondary | ICD-10-CM | POA: Diagnosis not present

## 2015-12-07 DIAGNOSIS — E860 Dehydration: Secondary | ICD-10-CM | POA: Diagnosis not present

## 2015-12-07 DIAGNOSIS — R1032 Left lower quadrant pain: Secondary | ICD-10-CM | POA: Diagnosis not present

## 2015-12-08 DIAGNOSIS — J441 Chronic obstructive pulmonary disease with (acute) exacerbation: Secondary | ICD-10-CM | POA: Diagnosis not present

## 2015-12-08 DIAGNOSIS — R7881 Bacteremia: Secondary | ICD-10-CM | POA: Diagnosis not present

## 2015-12-15 DIAGNOSIS — Z6833 Body mass index (BMI) 33.0-33.9, adult: Secondary | ICD-10-CM | POA: Diagnosis not present

## 2015-12-15 DIAGNOSIS — J440 Chronic obstructive pulmonary disease with acute lower respiratory infection: Secondary | ICD-10-CM | POA: Diagnosis not present

## 2015-12-15 DIAGNOSIS — I25810 Atherosclerosis of coronary artery bypass graft(s) without angina pectoris: Secondary | ICD-10-CM | POA: Diagnosis not present

## 2015-12-15 DIAGNOSIS — M545 Low back pain: Secondary | ICD-10-CM | POA: Diagnosis not present

## 2015-12-22 DIAGNOSIS — R52 Pain, unspecified: Secondary | ICD-10-CM | POA: Diagnosis not present

## 2016-01-03 DIAGNOSIS — K551 Chronic vascular disorders of intestine: Secondary | ICD-10-CM | POA: Diagnosis not present

## 2016-01-03 DIAGNOSIS — T814XXD Infection following a procedure, subsequent encounter: Secondary | ICD-10-CM | POA: Diagnosis not present

## 2016-01-03 DIAGNOSIS — D649 Anemia, unspecified: Secondary | ICD-10-CM | POA: Diagnosis not present

## 2016-01-03 DIAGNOSIS — K55059 Acute (reversible) ischemia of intestine, part and extent unspecified: Secondary | ICD-10-CM | POA: Diagnosis not present

## 2016-01-03 DIAGNOSIS — J9851 Mediastinitis: Secondary | ICD-10-CM | POA: Diagnosis not present

## 2016-01-03 DIAGNOSIS — E785 Hyperlipidemia, unspecified: Secondary | ICD-10-CM | POA: Diagnosis not present

## 2016-01-03 DIAGNOSIS — G4733 Obstructive sleep apnea (adult) (pediatric): Secondary | ICD-10-CM | POA: Diagnosis not present

## 2016-01-03 DIAGNOSIS — J449 Chronic obstructive pulmonary disease, unspecified: Secondary | ICD-10-CM | POA: Diagnosis not present

## 2016-01-03 DIAGNOSIS — R197 Diarrhea, unspecified: Secondary | ICD-10-CM | POA: Diagnosis not present

## 2016-01-03 DIAGNOSIS — A4102 Sepsis due to Methicillin resistant Staphylococcus aureus: Secondary | ICD-10-CM | POA: Diagnosis not present

## 2016-01-03 DIAGNOSIS — R1031 Right lower quadrant pain: Secondary | ICD-10-CM | POA: Diagnosis not present

## 2016-01-03 DIAGNOSIS — I25118 Atherosclerotic heart disease of native coronary artery with other forms of angina pectoris: Secondary | ICD-10-CM | POA: Diagnosis not present

## 2016-01-03 DIAGNOSIS — F339 Major depressive disorder, recurrent, unspecified: Secondary | ICD-10-CM | POA: Diagnosis not present

## 2016-01-03 DIAGNOSIS — E7800 Pure hypercholesterolemia, unspecified: Secondary | ICD-10-CM | POA: Diagnosis not present

## 2016-01-03 DIAGNOSIS — E860 Dehydration: Secondary | ICD-10-CM | POA: Diagnosis not present

## 2016-01-03 DIAGNOSIS — I2510 Atherosclerotic heart disease of native coronary artery without angina pectoris: Secondary | ICD-10-CM | POA: Diagnosis not present

## 2016-01-03 DIAGNOSIS — R1032 Left lower quadrant pain: Secondary | ICD-10-CM | POA: Diagnosis not present

## 2016-01-03 DIAGNOSIS — I10 Essential (primary) hypertension: Secondary | ICD-10-CM | POA: Diagnosis not present

## 2016-01-03 DIAGNOSIS — Z951 Presence of aortocoronary bypass graft: Secondary | ICD-10-CM | POA: Diagnosis not present

## 2016-01-03 DIAGNOSIS — F411 Generalized anxiety disorder: Secondary | ICD-10-CM | POA: Diagnosis not present

## 2016-01-03 DIAGNOSIS — T814XXA Infection following a procedure, initial encounter: Secondary | ICD-10-CM | POA: Diagnosis not present

## 2016-01-03 DIAGNOSIS — Q245 Malformation of coronary vessels: Secondary | ICD-10-CM | POA: Diagnosis not present

## 2016-01-03 DIAGNOSIS — R1030 Lower abdominal pain, unspecified: Secondary | ICD-10-CM | POA: Diagnosis not present

## 2016-01-03 DIAGNOSIS — G8929 Other chronic pain: Secondary | ICD-10-CM | POA: Diagnosis not present

## 2016-01-03 DIAGNOSIS — F3340 Major depressive disorder, recurrent, in remission, unspecified: Secondary | ICD-10-CM | POA: Diagnosis not present

## 2016-01-03 DIAGNOSIS — R1084 Generalized abdominal pain: Secondary | ICD-10-CM | POA: Diagnosis not present

## 2016-01-03 DIAGNOSIS — R079 Chest pain, unspecified: Secondary | ICD-10-CM | POA: Diagnosis not present

## 2016-01-03 DIAGNOSIS — B9562 Methicillin resistant Staphylococcus aureus infection as the cause of diseases classified elsewhere: Secondary | ICD-10-CM | POA: Diagnosis not present

## 2016-01-03 DIAGNOSIS — R112 Nausea with vomiting, unspecified: Secondary | ICD-10-CM | POA: Diagnosis not present

## 2016-01-05 DIAGNOSIS — J449 Chronic obstructive pulmonary disease, unspecified: Secondary | ICD-10-CM | POA: Diagnosis not present

## 2016-01-05 DIAGNOSIS — I2510 Atherosclerotic heart disease of native coronary artery without angina pectoris: Secondary | ICD-10-CM | POA: Diagnosis not present

## 2016-01-05 DIAGNOSIS — A4902 Methicillin resistant Staphylococcus aureus infection, unspecified site: Secondary | ICD-10-CM | POA: Diagnosis not present

## 2016-01-05 DIAGNOSIS — J9851 Mediastinitis: Secondary | ICD-10-CM | POA: Diagnosis not present

## 2016-01-05 DIAGNOSIS — K551 Chronic vascular disorders of intestine: Secondary | ICD-10-CM | POA: Diagnosis not present

## 2016-01-05 DIAGNOSIS — T814XXA Infection following a procedure, initial encounter: Secondary | ICD-10-CM | POA: Diagnosis not present

## 2016-01-05 DIAGNOSIS — B9562 Methicillin resistant Staphylococcus aureus infection as the cause of diseases classified elsewhere: Secondary | ICD-10-CM | POA: Diagnosis not present

## 2016-01-05 DIAGNOSIS — F339 Major depressive disorder, recurrent, unspecified: Secondary | ICD-10-CM | POA: Diagnosis not present

## 2016-01-05 DIAGNOSIS — Z951 Presence of aortocoronary bypass graft: Secondary | ICD-10-CM | POA: Diagnosis not present

## 2016-01-14 DIAGNOSIS — F339 Major depressive disorder, recurrent, unspecified: Secondary | ICD-10-CM | POA: Diagnosis not present

## 2016-01-14 DIAGNOSIS — I2510 Atherosclerotic heart disease of native coronary artery without angina pectoris: Secondary | ICD-10-CM | POA: Diagnosis not present

## 2016-01-14 DIAGNOSIS — G8929 Other chronic pain: Secondary | ICD-10-CM | POA: Diagnosis not present

## 2016-01-14 DIAGNOSIS — J449 Chronic obstructive pulmonary disease, unspecified: Secondary | ICD-10-CM | POA: Diagnosis not present

## 2016-01-14 DIAGNOSIS — T814XXA Infection following a procedure, initial encounter: Secondary | ICD-10-CM | POA: Diagnosis not present

## 2016-01-14 DIAGNOSIS — F419 Anxiety disorder, unspecified: Secondary | ICD-10-CM | POA: Diagnosis not present

## 2016-01-14 DIAGNOSIS — G4733 Obstructive sleep apnea (adult) (pediatric): Secondary | ICD-10-CM | POA: Diagnosis not present

## 2016-01-14 DIAGNOSIS — I10 Essential (primary) hypertension: Secondary | ICD-10-CM | POA: Diagnosis not present

## 2016-01-16 DIAGNOSIS — T814XXA Infection following a procedure, initial encounter: Secondary | ICD-10-CM | POA: Diagnosis not present

## 2016-01-16 DIAGNOSIS — I10 Essential (primary) hypertension: Secondary | ICD-10-CM | POA: Diagnosis not present

## 2016-01-16 DIAGNOSIS — G8929 Other chronic pain: Secondary | ICD-10-CM | POA: Diagnosis not present

## 2016-01-16 DIAGNOSIS — F419 Anxiety disorder, unspecified: Secondary | ICD-10-CM | POA: Diagnosis not present

## 2016-01-16 DIAGNOSIS — I2510 Atherosclerotic heart disease of native coronary artery without angina pectoris: Secondary | ICD-10-CM | POA: Diagnosis not present

## 2016-01-16 DIAGNOSIS — J449 Chronic obstructive pulmonary disease, unspecified: Secondary | ICD-10-CM | POA: Diagnosis not present

## 2016-01-16 DIAGNOSIS — G4733 Obstructive sleep apnea (adult) (pediatric): Secondary | ICD-10-CM | POA: Diagnosis not present

## 2016-01-16 DIAGNOSIS — F339 Major depressive disorder, recurrent, unspecified: Secondary | ICD-10-CM | POA: Diagnosis not present

## 2016-01-19 DIAGNOSIS — F339 Major depressive disorder, recurrent, unspecified: Secondary | ICD-10-CM | POA: Diagnosis not present

## 2016-01-19 DIAGNOSIS — I10 Essential (primary) hypertension: Secondary | ICD-10-CM | POA: Diagnosis not present

## 2016-01-19 DIAGNOSIS — I2510 Atherosclerotic heart disease of native coronary artery without angina pectoris: Secondary | ICD-10-CM | POA: Diagnosis not present

## 2016-01-19 DIAGNOSIS — T814XXA Infection following a procedure, initial encounter: Secondary | ICD-10-CM | POA: Diagnosis not present

## 2016-01-19 DIAGNOSIS — G4733 Obstructive sleep apnea (adult) (pediatric): Secondary | ICD-10-CM | POA: Diagnosis not present

## 2016-01-19 DIAGNOSIS — F419 Anxiety disorder, unspecified: Secondary | ICD-10-CM | POA: Diagnosis not present

## 2016-01-19 DIAGNOSIS — J449 Chronic obstructive pulmonary disease, unspecified: Secondary | ICD-10-CM | POA: Diagnosis not present

## 2016-01-19 DIAGNOSIS — G8929 Other chronic pain: Secondary | ICD-10-CM | POA: Diagnosis not present

## 2016-01-20 DIAGNOSIS — G8912 Acute post-thoracotomy pain: Secondary | ICD-10-CM | POA: Diagnosis not present

## 2016-01-20 DIAGNOSIS — R918 Other nonspecific abnormal finding of lung field: Secondary | ICD-10-CM | POA: Diagnosis not present

## 2016-01-20 DIAGNOSIS — Z7951 Long term (current) use of inhaled steroids: Secondary | ICD-10-CM | POA: Diagnosis not present

## 2016-01-20 DIAGNOSIS — R079 Chest pain, unspecified: Secondary | ICD-10-CM | POA: Diagnosis not present

## 2016-01-20 DIAGNOSIS — Y838 Other surgical procedures as the cause of abnormal reaction of the patient, or of later complication, without mention of misadventure at the time of the procedure: Secondary | ICD-10-CM | POA: Diagnosis not present

## 2016-01-20 DIAGNOSIS — Z79899 Other long term (current) drug therapy: Secondary | ICD-10-CM | POA: Diagnosis not present

## 2016-01-20 DIAGNOSIS — Z8614 Personal history of Methicillin resistant Staphylococcus aureus infection: Secondary | ICD-10-CM | POA: Diagnosis not present

## 2016-01-20 DIAGNOSIS — Z7982 Long term (current) use of aspirin: Secondary | ICD-10-CM | POA: Diagnosis not present

## 2016-01-20 DIAGNOSIS — T8189XA Other complications of procedures, not elsewhere classified, initial encounter: Secondary | ICD-10-CM | POA: Diagnosis not present

## 2016-01-21 DIAGNOSIS — F339 Major depressive disorder, recurrent, unspecified: Secondary | ICD-10-CM | POA: Diagnosis not present

## 2016-01-21 DIAGNOSIS — I10 Essential (primary) hypertension: Secondary | ICD-10-CM | POA: Diagnosis not present

## 2016-01-21 DIAGNOSIS — T814XXA Infection following a procedure, initial encounter: Secondary | ICD-10-CM | POA: Diagnosis not present

## 2016-01-21 DIAGNOSIS — G8929 Other chronic pain: Secondary | ICD-10-CM | POA: Diagnosis not present

## 2016-01-21 DIAGNOSIS — J449 Chronic obstructive pulmonary disease, unspecified: Secondary | ICD-10-CM | POA: Diagnosis not present

## 2016-01-21 DIAGNOSIS — I2510 Atherosclerotic heart disease of native coronary artery without angina pectoris: Secondary | ICD-10-CM | POA: Diagnosis not present

## 2016-01-21 DIAGNOSIS — G4733 Obstructive sleep apnea (adult) (pediatric): Secondary | ICD-10-CM | POA: Diagnosis not present

## 2016-01-21 DIAGNOSIS — F419 Anxiety disorder, unspecified: Secondary | ICD-10-CM | POA: Diagnosis not present

## 2016-01-23 DIAGNOSIS — I10 Essential (primary) hypertension: Secondary | ICD-10-CM | POA: Diagnosis not present

## 2016-01-23 DIAGNOSIS — G4733 Obstructive sleep apnea (adult) (pediatric): Secondary | ICD-10-CM | POA: Diagnosis not present

## 2016-01-23 DIAGNOSIS — I2510 Atherosclerotic heart disease of native coronary artery without angina pectoris: Secondary | ICD-10-CM | POA: Diagnosis not present

## 2016-01-23 DIAGNOSIS — F419 Anxiety disorder, unspecified: Secondary | ICD-10-CM | POA: Diagnosis not present

## 2016-01-23 DIAGNOSIS — T814XXA Infection following a procedure, initial encounter: Secondary | ICD-10-CM | POA: Diagnosis not present

## 2016-01-23 DIAGNOSIS — J449 Chronic obstructive pulmonary disease, unspecified: Secondary | ICD-10-CM | POA: Diagnosis not present

## 2016-01-23 DIAGNOSIS — G8929 Other chronic pain: Secondary | ICD-10-CM | POA: Diagnosis not present

## 2016-01-23 DIAGNOSIS — F339 Major depressive disorder, recurrent, unspecified: Secondary | ICD-10-CM | POA: Diagnosis not present

## 2016-01-26 DIAGNOSIS — T814XXA Infection following a procedure, initial encounter: Secondary | ICD-10-CM | POA: Diagnosis not present

## 2016-01-26 DIAGNOSIS — F339 Major depressive disorder, recurrent, unspecified: Secondary | ICD-10-CM | POA: Diagnosis not present

## 2016-01-26 DIAGNOSIS — J449 Chronic obstructive pulmonary disease, unspecified: Secondary | ICD-10-CM | POA: Diagnosis not present

## 2016-01-26 DIAGNOSIS — F419 Anxiety disorder, unspecified: Secondary | ICD-10-CM | POA: Diagnosis not present

## 2016-01-26 DIAGNOSIS — I2510 Atherosclerotic heart disease of native coronary artery without angina pectoris: Secondary | ICD-10-CM | POA: Diagnosis not present

## 2016-01-26 DIAGNOSIS — G4733 Obstructive sleep apnea (adult) (pediatric): Secondary | ICD-10-CM | POA: Diagnosis not present

## 2016-01-26 DIAGNOSIS — I10 Essential (primary) hypertension: Secondary | ICD-10-CM | POA: Diagnosis not present

## 2016-01-26 DIAGNOSIS — G8929 Other chronic pain: Secondary | ICD-10-CM | POA: Diagnosis not present

## 2016-01-28 DIAGNOSIS — F419 Anxiety disorder, unspecified: Secondary | ICD-10-CM | POA: Diagnosis not present

## 2016-01-28 DIAGNOSIS — I2510 Atherosclerotic heart disease of native coronary artery without angina pectoris: Secondary | ICD-10-CM | POA: Diagnosis not present

## 2016-01-28 DIAGNOSIS — I10 Essential (primary) hypertension: Secondary | ICD-10-CM | POA: Diagnosis not present

## 2016-01-28 DIAGNOSIS — J449 Chronic obstructive pulmonary disease, unspecified: Secondary | ICD-10-CM | POA: Diagnosis not present

## 2016-01-28 DIAGNOSIS — G4733 Obstructive sleep apnea (adult) (pediatric): Secondary | ICD-10-CM | POA: Diagnosis not present

## 2016-01-28 DIAGNOSIS — T814XXA Infection following a procedure, initial encounter: Secondary | ICD-10-CM | POA: Diagnosis not present

## 2016-01-28 DIAGNOSIS — F339 Major depressive disorder, recurrent, unspecified: Secondary | ICD-10-CM | POA: Diagnosis not present

## 2016-01-28 DIAGNOSIS — G8929 Other chronic pain: Secondary | ICD-10-CM | POA: Diagnosis not present

## 2016-01-29 DIAGNOSIS — I2510 Atherosclerotic heart disease of native coronary artery without angina pectoris: Secondary | ICD-10-CM | POA: Diagnosis not present

## 2016-01-29 DIAGNOSIS — I440 Atrioventricular block, first degree: Secondary | ICD-10-CM | POA: Diagnosis not present

## 2016-01-29 DIAGNOSIS — Z87891 Personal history of nicotine dependence: Secondary | ICD-10-CM | POA: Diagnosis not present

## 2016-01-29 DIAGNOSIS — R001 Bradycardia, unspecified: Secondary | ICD-10-CM | POA: Diagnosis not present

## 2016-01-29 DIAGNOSIS — Z951 Presence of aortocoronary bypass graft: Secondary | ICD-10-CM | POA: Diagnosis not present

## 2016-01-29 DIAGNOSIS — J9851 Mediastinitis: Secondary | ICD-10-CM | POA: Diagnosis not present

## 2016-01-29 DIAGNOSIS — Z22322 Carrier or suspected carrier of Methicillin resistant Staphylococcus aureus: Secondary | ICD-10-CM | POA: Diagnosis not present

## 2016-01-29 DIAGNOSIS — I700 Atherosclerosis of aorta: Secondary | ICD-10-CM | POA: Diagnosis not present

## 2016-02-02 DIAGNOSIS — I10 Essential (primary) hypertension: Secondary | ICD-10-CM | POA: Diagnosis not present

## 2016-02-02 DIAGNOSIS — G4733 Obstructive sleep apnea (adult) (pediatric): Secondary | ICD-10-CM | POA: Diagnosis not present

## 2016-02-02 DIAGNOSIS — J449 Chronic obstructive pulmonary disease, unspecified: Secondary | ICD-10-CM | POA: Diagnosis not present

## 2016-02-02 DIAGNOSIS — F419 Anxiety disorder, unspecified: Secondary | ICD-10-CM | POA: Diagnosis not present

## 2016-02-02 DIAGNOSIS — T814XXA Infection following a procedure, initial encounter: Secondary | ICD-10-CM | POA: Diagnosis not present

## 2016-02-02 DIAGNOSIS — F339 Major depressive disorder, recurrent, unspecified: Secondary | ICD-10-CM | POA: Diagnosis not present

## 2016-02-02 DIAGNOSIS — I2510 Atherosclerotic heart disease of native coronary artery without angina pectoris: Secondary | ICD-10-CM | POA: Diagnosis not present

## 2016-02-02 DIAGNOSIS — G8929 Other chronic pain: Secondary | ICD-10-CM | POA: Diagnosis not present

## 2016-02-04 DIAGNOSIS — F339 Major depressive disorder, recurrent, unspecified: Secondary | ICD-10-CM | POA: Diagnosis not present

## 2016-02-04 DIAGNOSIS — G4733 Obstructive sleep apnea (adult) (pediatric): Secondary | ICD-10-CM | POA: Diagnosis not present

## 2016-02-04 DIAGNOSIS — J449 Chronic obstructive pulmonary disease, unspecified: Secondary | ICD-10-CM | POA: Diagnosis not present

## 2016-02-04 DIAGNOSIS — G8929 Other chronic pain: Secondary | ICD-10-CM | POA: Diagnosis not present

## 2016-02-04 DIAGNOSIS — F419 Anxiety disorder, unspecified: Secondary | ICD-10-CM | POA: Diagnosis not present

## 2016-02-04 DIAGNOSIS — I10 Essential (primary) hypertension: Secondary | ICD-10-CM | POA: Diagnosis not present

## 2016-02-04 DIAGNOSIS — I2510 Atherosclerotic heart disease of native coronary artery without angina pectoris: Secondary | ICD-10-CM | POA: Diagnosis not present

## 2016-02-04 DIAGNOSIS — T814XXA Infection following a procedure, initial encounter: Secondary | ICD-10-CM | POA: Diagnosis not present

## 2016-02-06 DIAGNOSIS — I10 Essential (primary) hypertension: Secondary | ICD-10-CM | POA: Diagnosis not present

## 2016-02-06 DIAGNOSIS — G8929 Other chronic pain: Secondary | ICD-10-CM | POA: Diagnosis not present

## 2016-02-06 DIAGNOSIS — I2510 Atherosclerotic heart disease of native coronary artery without angina pectoris: Secondary | ICD-10-CM | POA: Diagnosis not present

## 2016-02-06 DIAGNOSIS — F339 Major depressive disorder, recurrent, unspecified: Secondary | ICD-10-CM | POA: Diagnosis not present

## 2016-02-06 DIAGNOSIS — J449 Chronic obstructive pulmonary disease, unspecified: Secondary | ICD-10-CM | POA: Diagnosis not present

## 2016-02-06 DIAGNOSIS — G4733 Obstructive sleep apnea (adult) (pediatric): Secondary | ICD-10-CM | POA: Diagnosis not present

## 2016-02-06 DIAGNOSIS — F419 Anxiety disorder, unspecified: Secondary | ICD-10-CM | POA: Diagnosis not present

## 2016-02-06 DIAGNOSIS — T814XXA Infection following a procedure, initial encounter: Secondary | ICD-10-CM | POA: Diagnosis not present

## 2016-02-09 DIAGNOSIS — I10 Essential (primary) hypertension: Secondary | ICD-10-CM | POA: Diagnosis not present

## 2016-02-09 DIAGNOSIS — F419 Anxiety disorder, unspecified: Secondary | ICD-10-CM | POA: Diagnosis not present

## 2016-02-09 DIAGNOSIS — J449 Chronic obstructive pulmonary disease, unspecified: Secondary | ICD-10-CM | POA: Diagnosis not present

## 2016-02-09 DIAGNOSIS — F339 Major depressive disorder, recurrent, unspecified: Secondary | ICD-10-CM | POA: Diagnosis not present

## 2016-02-09 DIAGNOSIS — T814XXA Infection following a procedure, initial encounter: Secondary | ICD-10-CM | POA: Diagnosis not present

## 2016-02-09 DIAGNOSIS — G4733 Obstructive sleep apnea (adult) (pediatric): Secondary | ICD-10-CM | POA: Diagnosis not present

## 2016-02-09 DIAGNOSIS — I2510 Atherosclerotic heart disease of native coronary artery without angina pectoris: Secondary | ICD-10-CM | POA: Diagnosis not present

## 2016-02-09 DIAGNOSIS — G8929 Other chronic pain: Secondary | ICD-10-CM | POA: Diagnosis not present

## 2016-02-11 DIAGNOSIS — G8929 Other chronic pain: Secondary | ICD-10-CM | POA: Diagnosis not present

## 2016-02-11 DIAGNOSIS — I2510 Atherosclerotic heart disease of native coronary artery without angina pectoris: Secondary | ICD-10-CM | POA: Diagnosis not present

## 2016-02-11 DIAGNOSIS — F419 Anxiety disorder, unspecified: Secondary | ICD-10-CM | POA: Diagnosis not present

## 2016-02-11 DIAGNOSIS — I10 Essential (primary) hypertension: Secondary | ICD-10-CM | POA: Diagnosis not present

## 2016-02-11 DIAGNOSIS — J449 Chronic obstructive pulmonary disease, unspecified: Secondary | ICD-10-CM | POA: Diagnosis not present

## 2016-02-11 DIAGNOSIS — G4733 Obstructive sleep apnea (adult) (pediatric): Secondary | ICD-10-CM | POA: Diagnosis not present

## 2016-02-11 DIAGNOSIS — T814XXA Infection following a procedure, initial encounter: Secondary | ICD-10-CM | POA: Diagnosis not present

## 2016-02-11 DIAGNOSIS — F339 Major depressive disorder, recurrent, unspecified: Secondary | ICD-10-CM | POA: Diagnosis not present

## 2016-02-13 DIAGNOSIS — T814XXA Infection following a procedure, initial encounter: Secondary | ICD-10-CM | POA: Diagnosis not present

## 2016-02-13 DIAGNOSIS — F339 Major depressive disorder, recurrent, unspecified: Secondary | ICD-10-CM | POA: Diagnosis not present

## 2016-02-13 DIAGNOSIS — I2510 Atherosclerotic heart disease of native coronary artery without angina pectoris: Secondary | ICD-10-CM | POA: Diagnosis not present

## 2016-02-13 DIAGNOSIS — F419 Anxiety disorder, unspecified: Secondary | ICD-10-CM | POA: Diagnosis not present

## 2016-02-13 DIAGNOSIS — J449 Chronic obstructive pulmonary disease, unspecified: Secondary | ICD-10-CM | POA: Diagnosis not present

## 2016-02-13 DIAGNOSIS — G4733 Obstructive sleep apnea (adult) (pediatric): Secondary | ICD-10-CM | POA: Diagnosis not present

## 2016-02-13 DIAGNOSIS — I10 Essential (primary) hypertension: Secondary | ICD-10-CM | POA: Diagnosis not present

## 2016-02-13 DIAGNOSIS — G8929 Other chronic pain: Secondary | ICD-10-CM | POA: Diagnosis not present

## 2016-02-15 DIAGNOSIS — I469 Cardiac arrest, cause unspecified: Secondary | ICD-10-CM | POA: Diagnosis not present

## 2016-02-15 DIAGNOSIS — I440 Atrioventricular block, first degree: Secondary | ICD-10-CM | POA: Diagnosis not present

## 2016-02-15 DIAGNOSIS — R9431 Abnormal electrocardiogram [ECG] [EKG]: Secondary | ICD-10-CM | POA: Diagnosis not present

## 2016-02-15 DIAGNOSIS — Z7982 Long term (current) use of aspirin: Secondary | ICD-10-CM | POA: Diagnosis not present

## 2016-02-15 DIAGNOSIS — R600 Localized edema: Secondary | ICD-10-CM | POA: Diagnosis not present

## 2016-02-15 DIAGNOSIS — Z79899 Other long term (current) drug therapy: Secondary | ICD-10-CM | POA: Diagnosis not present

## 2016-02-15 DIAGNOSIS — Z5181 Encounter for therapeutic drug level monitoring: Secondary | ICD-10-CM | POA: Diagnosis not present

## 2016-02-15 DIAGNOSIS — R079 Chest pain, unspecified: Secondary | ICD-10-CM | POA: Diagnosis not present

## 2016-02-15 DIAGNOSIS — Z87891 Personal history of nicotine dependence: Secondary | ICD-10-CM | POA: Diagnosis not present

## 2016-02-15 DIAGNOSIS — R0789 Other chest pain: Secondary | ICD-10-CM | POA: Diagnosis not present

## 2016-02-16 DIAGNOSIS — I10 Essential (primary) hypertension: Secondary | ICD-10-CM | POA: Diagnosis not present

## 2016-02-16 DIAGNOSIS — R600 Localized edema: Secondary | ICD-10-CM | POA: Diagnosis not present

## 2016-02-16 DIAGNOSIS — T814XXA Infection following a procedure, initial encounter: Secondary | ICD-10-CM | POA: Diagnosis not present

## 2016-02-16 DIAGNOSIS — G4733 Obstructive sleep apnea (adult) (pediatric): Secondary | ICD-10-CM | POA: Diagnosis not present

## 2016-02-16 DIAGNOSIS — I2510 Atherosclerotic heart disease of native coronary artery without angina pectoris: Secondary | ICD-10-CM | POA: Diagnosis not present

## 2016-02-16 DIAGNOSIS — G8929 Other chronic pain: Secondary | ICD-10-CM | POA: Diagnosis not present

## 2016-02-16 DIAGNOSIS — F339 Major depressive disorder, recurrent, unspecified: Secondary | ICD-10-CM | POA: Diagnosis not present

## 2016-02-16 DIAGNOSIS — R0789 Other chest pain: Secondary | ICD-10-CM | POA: Diagnosis not present

## 2016-02-16 DIAGNOSIS — J449 Chronic obstructive pulmonary disease, unspecified: Secondary | ICD-10-CM | POA: Diagnosis not present

## 2016-02-16 DIAGNOSIS — F419 Anxiety disorder, unspecified: Secondary | ICD-10-CM | POA: Diagnosis not present

## 2016-02-18 DIAGNOSIS — F339 Major depressive disorder, recurrent, unspecified: Secondary | ICD-10-CM | POA: Diagnosis not present

## 2016-02-18 DIAGNOSIS — G4733 Obstructive sleep apnea (adult) (pediatric): Secondary | ICD-10-CM | POA: Diagnosis not present

## 2016-02-18 DIAGNOSIS — J449 Chronic obstructive pulmonary disease, unspecified: Secondary | ICD-10-CM | POA: Diagnosis not present

## 2016-02-18 DIAGNOSIS — I10 Essential (primary) hypertension: Secondary | ICD-10-CM | POA: Diagnosis not present

## 2016-02-18 DIAGNOSIS — I2510 Atherosclerotic heart disease of native coronary artery without angina pectoris: Secondary | ICD-10-CM | POA: Diagnosis not present

## 2016-02-18 DIAGNOSIS — F419 Anxiety disorder, unspecified: Secondary | ICD-10-CM | POA: Diagnosis not present

## 2016-02-18 DIAGNOSIS — T814XXA Infection following a procedure, initial encounter: Secondary | ICD-10-CM | POA: Diagnosis not present

## 2016-02-18 DIAGNOSIS — G8929 Other chronic pain: Secondary | ICD-10-CM | POA: Diagnosis not present

## 2016-02-19 DIAGNOSIS — T814XXD Infection following a procedure, subsequent encounter: Secondary | ICD-10-CM | POA: Diagnosis not present

## 2016-02-19 DIAGNOSIS — B9689 Other specified bacterial agents as the cause of diseases classified elsewhere: Secondary | ICD-10-CM | POA: Diagnosis not present

## 2016-02-19 DIAGNOSIS — Z79899 Other long term (current) drug therapy: Secondary | ICD-10-CM | POA: Diagnosis not present

## 2016-02-19 DIAGNOSIS — Z0289 Encounter for other administrative examinations: Secondary | ICD-10-CM | POA: Diagnosis not present

## 2016-02-23 DIAGNOSIS — T814XXA Infection following a procedure, initial encounter: Secondary | ICD-10-CM | POA: Diagnosis not present

## 2016-02-23 DIAGNOSIS — G4733 Obstructive sleep apnea (adult) (pediatric): Secondary | ICD-10-CM | POA: Diagnosis not present

## 2016-02-23 DIAGNOSIS — J449 Chronic obstructive pulmonary disease, unspecified: Secondary | ICD-10-CM | POA: Diagnosis not present

## 2016-02-23 DIAGNOSIS — F339 Major depressive disorder, recurrent, unspecified: Secondary | ICD-10-CM | POA: Diagnosis not present

## 2016-02-23 DIAGNOSIS — I10 Essential (primary) hypertension: Secondary | ICD-10-CM | POA: Diagnosis not present

## 2016-02-23 DIAGNOSIS — F419 Anxiety disorder, unspecified: Secondary | ICD-10-CM | POA: Diagnosis not present

## 2016-02-23 DIAGNOSIS — I2510 Atherosclerotic heart disease of native coronary artery without angina pectoris: Secondary | ICD-10-CM | POA: Diagnosis not present

## 2016-02-23 DIAGNOSIS — G8929 Other chronic pain: Secondary | ICD-10-CM | POA: Diagnosis not present

## 2016-02-25 ENCOUNTER — Emergency Department (HOSPITAL_COMMUNITY): Payer: Commercial Managed Care - HMO

## 2016-02-25 ENCOUNTER — Observation Stay (HOSPITAL_COMMUNITY)
Admission: EM | Admit: 2016-02-25 | Discharge: 2016-02-26 | Disposition: A | Discharge status: Home / Self Care | Payer: Commercial Managed Care - HMO | Attending: Family Medicine | Admitting: Family Medicine

## 2016-02-25 ENCOUNTER — Encounter (HOSPITAL_COMMUNITY): Payer: Self-pay | Admitting: Emergency Medicine

## 2016-02-25 DIAGNOSIS — M1990 Unspecified osteoarthritis, unspecified site: Secondary | ICD-10-CM | POA: Insufficient documentation

## 2016-02-25 DIAGNOSIS — R110 Nausea: Secondary | ICD-10-CM | POA: Insufficient documentation

## 2016-02-25 DIAGNOSIS — Z79899 Other long term (current) drug therapy: Secondary | ICD-10-CM | POA: Insufficient documentation

## 2016-02-25 DIAGNOSIS — R079 Chest pain, unspecified: Secondary | ICD-10-CM

## 2016-02-25 DIAGNOSIS — I110 Hypertensive heart disease with heart failure: Secondary | ICD-10-CM | POA: Insufficient documentation

## 2016-02-25 DIAGNOSIS — I2510 Atherosclerotic heart disease of native coronary artery without angina pectoris: Secondary | ICD-10-CM | POA: Insufficient documentation

## 2016-02-25 DIAGNOSIS — F329 Major depressive disorder, single episode, unspecified: Secondary | ICD-10-CM | POA: Diagnosis not present

## 2016-02-25 DIAGNOSIS — M545 Low back pain: Secondary | ICD-10-CM | POA: Diagnosis not present

## 2016-02-25 DIAGNOSIS — R55 Syncope and collapse: Secondary | ICD-10-CM | POA: Insufficient documentation

## 2016-02-25 DIAGNOSIS — E785 Hyperlipidemia, unspecified: Secondary | ICD-10-CM | POA: Diagnosis not present

## 2016-02-25 DIAGNOSIS — R0602 Shortness of breath: Secondary | ICD-10-CM | POA: Diagnosis not present

## 2016-02-25 DIAGNOSIS — E669 Obesity, unspecified: Secondary | ICD-10-CM | POA: Insufficient documentation

## 2016-02-25 DIAGNOSIS — F17210 Nicotine dependence, cigarettes, uncomplicated: Secondary | ICD-10-CM | POA: Insufficient documentation

## 2016-02-25 DIAGNOSIS — M7989 Other specified soft tissue disorders: Secondary | ICD-10-CM | POA: Insufficient documentation

## 2016-02-25 DIAGNOSIS — I509 Heart failure, unspecified: Secondary | ICD-10-CM | POA: Insufficient documentation

## 2016-02-25 DIAGNOSIS — J449 Chronic obstructive pulmonary disease, unspecified: Secondary | ICD-10-CM | POA: Diagnosis not present

## 2016-02-25 DIAGNOSIS — Z6833 Body mass index (BMI) 33.0-33.9, adult: Secondary | ICD-10-CM | POA: Diagnosis not present

## 2016-02-25 DIAGNOSIS — R0789 Other chest pain: Secondary | ICD-10-CM | POA: Diagnosis not present

## 2016-02-25 DIAGNOSIS — R51 Headache: Secondary | ICD-10-CM | POA: Insufficient documentation

## 2016-02-25 DIAGNOSIS — Z7982 Long term (current) use of aspirin: Secondary | ICD-10-CM | POA: Insufficient documentation

## 2016-02-25 LAB — BASIC METABOLIC PANEL
ANION GAP: 7 (ref 5–15)
BUN: 9 mg/dL (ref 6–20)
CALCIUM: 8.5 mg/dL — AB (ref 8.9–10.3)
CO2: 22 mmol/L (ref 22–32)
CREATININE: 1 mg/dL (ref 0.61–1.24)
Chloride: 105 mmol/L (ref 101–111)
Glucose, Bld: 86 mg/dL (ref 65–99)
Potassium: 3.9 mmol/L (ref 3.5–5.1)
SODIUM: 134 mmol/L — AB (ref 135–145)

## 2016-02-25 LAB — CBC WITH DIFFERENTIAL/PLATELET
BASOS ABS: 0.1 10*3/uL (ref 0.0–0.1)
BASOS PCT: 1 %
EOS ABS: 0.2 10*3/uL (ref 0.0–0.7)
Eosinophils Relative: 2 %
HCT: 31.9 % — ABNORMAL LOW (ref 39.0–52.0)
HEMOGLOBIN: 10.4 g/dL — AB (ref 13.0–17.0)
Lymphocytes Relative: 23 %
Lymphs Abs: 2.5 10*3/uL (ref 0.7–4.0)
MCH: 29 pg (ref 26.0–34.0)
MCHC: 32.6 g/dL (ref 30.0–36.0)
MCV: 88.9 fL (ref 78.0–100.0)
MONOS PCT: 12 %
Monocytes Absolute: 1.3 10*3/uL — ABNORMAL HIGH (ref 0.1–1.0)
NEUTROS ABS: 6.7 10*3/uL (ref 1.7–7.7)
NEUTROS PCT: 62 %
Platelets: 191 10*3/uL (ref 150–400)
RBC: 3.59 MIL/uL — ABNORMAL LOW (ref 4.22–5.81)
RDW: 15.7 % — ABNORMAL HIGH (ref 11.5–15.5)
WBC: 10.7 10*3/uL — AB (ref 4.0–10.5)

## 2016-02-25 LAB — TROPONIN I: Troponin I: <0.03 ng/mL (ref <0.03)

## 2016-02-25 LAB — RAPID URINE DRUG SCREEN, HOSP PERFORMED
AMPHETAMINES: NOT DETECTED
BARBITURATES: NOT DETECTED
BENZODIAZEPINES: POSITIVE — AB
COCAINE: POSITIVE — AB
Opiates: NOT DETECTED
Tetrahydrocannabinol: NOT DETECTED

## 2016-02-25 MED ORDER — ENSURE ENLIVE PO LIQD
237.0000 mL | Freq: Two times a day (BID) | ORAL | Status: DC
  Administered 2016-02-26 (×2): 237 mL via ORAL

## 2016-02-25 MED ORDER — ALPRAZOLAM 1 MG PO TABS: 1.0000 mg | ORAL_TABLET | Freq: Three times a day (TID) | ORAL | Status: DC | PRN

## 2016-02-25 MED ORDER — ISOSORBIDE MONONITRATE ER 60 MG PO TB24
30.0000 mg | ORAL_TABLET | Freq: Two times a day (BID) | ORAL | Status: DC
  Administered 2016-02-25 – 2016-02-26 (×3): 30 mg via ORAL
  Filled 2016-02-25 (×3): qty 1

## 2016-02-25 MED ORDER — TAMSULOSIN HCL 0.4 MG PO CAPS
0.4000 mg | ORAL_CAPSULE | Freq: Every day | ORAL | Status: DC
  Administered 2016-02-25 – 2016-02-26 (×2): 0.4 mg via ORAL
  Filled 2016-02-25 (×2): qty 1

## 2016-02-25 MED ORDER — DOXEPIN HCL 25 MG PO CAPS
25.0000 mg | ORAL_CAPSULE | Freq: Every day | ORAL | Status: DC
  Administered 2016-02-25: 25 mg via ORAL
  Filled 2016-02-25: qty 1

## 2016-02-25 MED ORDER — CITALOPRAM HYDROBROMIDE 20 MG PO TABS
10.0000 mg | ORAL_TABLET | Freq: Every morning | ORAL | Status: DC
  Administered 2016-02-25 – 2016-02-26 (×2): 10 mg via ORAL
  Filled 2016-02-25 (×2): qty 1

## 2016-02-25 MED ORDER — RANOLAZINE ER 500 MG PO TB12
500.0000 mg | ORAL_TABLET | Freq: Two times a day (BID) | ORAL | Status: DC
  Administered 2016-02-25 – 2016-02-26 (×3): 500 mg via ORAL
  Filled 2016-02-25 (×3): qty 1

## 2016-02-25 MED ORDER — PANTOPRAZOLE SODIUM 40 MG PO TBEC
40.0000 mg | DELAYED_RELEASE_TABLET | Freq: Every day | ORAL | Status: DC
  Administered 2016-02-25 – 2016-02-26 (×2): 40 mg via ORAL
  Filled 2016-02-25 (×2): qty 1

## 2016-02-25 MED ORDER — ATORVASTATIN CALCIUM 10 MG PO TABS: 10.0000 mg | ORAL_TABLET | Freq: Every day | ORAL | Status: DC

## 2016-02-25 MED ORDER — ONDANSETRON HCL 4 MG/2ML IJ SOLN: 4.0000 mg | Freq: Four times a day (QID) | INTRAMUSCULAR | Status: DC | PRN

## 2016-02-25 MED ORDER — FENTANYL CITRATE (PF) 100 MCG/2ML IJ SOLN
50.0000 ug | Freq: Once | INTRAMUSCULAR | Status: AC
  Administered 2016-02-25: 50 ug via INTRAVENOUS
  Filled 2016-02-25: qty 2

## 2016-02-25 MED ORDER — OXYCODONE HCL 5 MG PO TABS
5.0000 mg | ORAL_TABLET | Freq: Three times a day (TID) | ORAL | Status: DC | PRN
  Administered 2016-02-26 (×2): 5 mg via ORAL
  Filled 2016-02-25 (×3): qty 1

## 2016-02-25 MED ORDER — IPRATROPIUM-ALBUTEROL 0.5-2.5 (3) MG/3ML IN SOLN: 3.0000 mL | Freq: Four times a day (QID) | RESPIRATORY_TRACT | Status: DC | PRN

## 2016-02-25 MED ORDER — ASPIRIN EC 81 MG PO TBEC
81.0000 mg | DELAYED_RELEASE_TABLET | Freq: Every day | ORAL | Status: DC
  Administered 2016-02-25 – 2016-02-26 (×2): 81 mg via ORAL
  Filled 2016-02-25 (×2): qty 1

## 2016-02-25 MED ORDER — OXYCODONE-ACETAMINOPHEN 10-325 MG PO TABS: 1.0000 | ORAL_TABLET | Freq: Three times a day (TID) | ORAL | Status: DC | PRN

## 2016-02-25 MED ORDER — MORPHINE SULFATE (PF) 2 MG/ML IV SOLN: 1.0000 mg | INTRAVENOUS | Status: DC | PRN

## 2016-02-25 MED ORDER — POTASSIUM CHLORIDE CRYS ER 20 MEQ PO TBCR
20.0000 meq | EXTENDED_RELEASE_TABLET | Freq: Every morning | ORAL | Status: DC
  Administered 2016-02-25 – 2016-02-26 (×2): 20 meq via ORAL
  Filled 2016-02-25 (×2): qty 1

## 2016-02-25 MED ORDER — FUROSEMIDE 40 MG PO TABS
40.0000 mg | ORAL_TABLET | Freq: Every morning | ORAL | Status: DC
  Administered 2016-02-25 – 2016-02-26 (×2): 40 mg via ORAL
  Filled 2016-02-25 (×2): qty 1

## 2016-02-25 MED ORDER — ACETAMINOPHEN 325 MG PO TABS: 650.0000 mg | ORAL_TABLET | ORAL | Status: DC | PRN

## 2016-02-25 MED ORDER — METOPROLOL TARTRATE 25 MG PO TABS
25.0000 mg | ORAL_TABLET | Freq: Two times a day (BID) | ORAL | Status: DC
  Administered 2016-02-25 – 2016-02-26 (×2): 25 mg via ORAL
  Filled 2016-02-25 (×3): qty 1

## 2016-02-25 MED ORDER — MOMETASONE FURO-FORMOTEROL FUM 200-5 MCG/ACT IN AERO
2.0000 | INHALATION_SPRAY | Freq: Two times a day (BID) | RESPIRATORY_TRACT | Status: DC
  Administered 2016-02-26: 2 via RESPIRATORY_TRACT
  Filled 2016-02-25: qty 8.8

## 2016-02-25 MED ORDER — AMIODARONE HCL 200 MG PO TABS
200.0000 mg | ORAL_TABLET | Freq: Every day | ORAL | Status: DC
  Administered 2016-02-25 – 2016-02-26 (×2): 200 mg via ORAL
  Filled 2016-02-25 (×2): qty 1

## 2016-02-25 MED ORDER — AMLODIPINE BESYLATE 5 MG PO TABS
10.0000 mg | ORAL_TABLET | Freq: Every day | ORAL | Status: DC
  Administered 2016-02-26: 10 mg via ORAL
  Filled 2016-02-25 (×2): qty 2

## 2016-02-25 MED ORDER — NITROGLYCERIN 0.4 MG SL SUBL: 0.4000 mg | SUBLINGUAL_TABLET | SUBLINGUAL | Status: DC | PRN

## 2016-02-25 MED ORDER — TIOTROPIUM BROMIDE MONOHYDRATE 18 MCG IN CAPS
18.0000 ug | ORAL_CAPSULE | Freq: Every day | RESPIRATORY_TRACT | Status: DC
  Filled 2016-02-25: qty 5

## 2016-02-25 MED ORDER — HYDROCHLOROTHIAZIDE 25 MG PO TABS
25.0000 mg | ORAL_TABLET | Freq: Every morning | ORAL | Status: DC
  Administered 2016-02-25 – 2016-02-26 (×2): 25 mg via ORAL
  Filled 2016-02-25 (×2): qty 1

## 2016-02-25 MED ORDER — OXYCODONE-ACETAMINOPHEN 5-325 MG PO TABS
1.0000 | ORAL_TABLET | Freq: Three times a day (TID) | ORAL | Status: DC | PRN
Start: 2016-02-25 — End: 2016-02-26
  Administered 2016-02-25 – 2016-02-26 (×3): 1 via ORAL
  Filled 2016-02-25 (×4): qty 1

## 2016-02-25 MED ORDER — GI COCKTAIL ~~LOC~~: 30.0000 mL | Freq: Four times a day (QID) | ORAL | Status: DC | PRN

## 2016-02-25 NOTE — ED Provider Notes (Signed)
CSN: [id number]     Arrival date & time 02/25/16  6578 History  By signing my name below, I, Placido Sou, attest that this documentation has been prepared under the direction and in the presence of Zadie Rhine, MD. Electronically Signed: Placido Sou, ED Scribe. 02/25/2016. 8:38 AM.    Chief Complaint  Patient presents with  . Chest Pain   Patient is a 60 y.o. male presenting with chest pain. The history is provided by the patient. No language interpreter was used.  Chest Pain Pain location:  L chest Pain quality: pressure   Pain radiates to:  Lower back Pain severity:  Moderate Onset quality:  Unable to specify Duration:  5 hours Timing:  Constant Progression:  Unchanged Chronicity:  Recurrent Context: no trauma   Ineffective treatments:  Nitroglycerin Associated symptoms: back pain, headache, lower extremity edema, nausea, shortness of breath and syncope   Associated symptoms: no near-syncope and not vomiting   Risk factors: coronary artery disease, high cholesterol, hypertension, obesity and surgery     HPI Comments: [name] is a 60 y.o. male with a PMHx of obesity, HLD, COPD, MI, HTN, CAD, CHF and a triple bypass which was performed in 09/2015 at [facility] who presents to the Emergency Department by ambulance complaining of moderate left sided chest pressure onset PTA. Pt states that he quit smoking and drinking 1 year ago and last night consumed 1.5 beers and smoked cigarettes. Pt states that he woke this morning at 4:30 AM to go to the bathroom and was experiencing SOB, HA and CP and after getting to the bathroom lost consciousness. He woke some time later with his chest pressure stating that he was unable to get himself up from the floor. He confirms his PMHx of MI noting that his current chest pressure is consistent with past MI. He states last night he experienced 1x nausea w/o vomiting. He reports associated lower back pain and worsening leg swelling. 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 Per EMS, pt was given 324 ASA and 1 nitro PTA. Pt denies hemoptysis and is unsure of any head trauma.   Past Medical History  Diagnosis Date  . COPD (chronic obstructive pulmonary disease) (HCC)   . Essential hypertension, benign   . Chronic back pain   . Anxiety disorder   . Hyperlipidemia   . Depression   . Obesity   . History of alcohol abuse   . History of cocaine abuse   . Pneumonia   . GERD (gastroesophageal reflux disease)   . Headache(784.0)   . Arthritis   . CAD (coronary artery disease)     a. ~ 2000 cath at [facility];  b. 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  . CHF (congestive heart failure) (HCC)   . Chronic hip pain    Past Surgical History  Procedure Laterality Date  . Hip surgery      rt  . Cholecystectomy    . Back surgery    . Nasal/sinus endoscopy    . Total hip arthroplasty  05/08/2012    Procedure: TOTAL HIP ARTHROPLASTY;  Surgeon: Nestor Lewandowsky, MD;  Location: [facility] OR;  Service: Orthopedics;  Laterality: Left;  . Joint replacement    . Left heart catheterization with coronary  angiogram N/A 09/12/2013    Procedure: LEFT HEART CATHETERIZATION WITH CORONARY ANGIOGRAM;  Surgeon: Kathleene Hazel, MD;  Location: [facility] CATH LAB;  Service: Cardiovascular;  Laterality: N/A;  . Total hip arthroplasty  2014 per pt  . Coronary artery bypass graft     Family History  Problem Relation Age of Onset  . Heart attack Mother   . Heart attack Brother   . Heart attack Sister    Social History  Substance Use Topics  . Smoking status: Current Every Day Smoker -- 0.50 packs/day for 39 years    Types: Cigarettes  . Smokeless tobacco: Never Used  . Alcohol Use: 0.0 oz/week    0 Standard drinks or equivalent per week     Comment: socially; reports  drinking 2 beers last nigh    Review of Systems  Respiratory: Positive for chest tightness and shortness of breath.   Cardiovascular: Positive for chest pain, leg swelling and syncope. Negative for near-syncope.  Gastrointestinal: Positive for nausea. Negative for vomiting.  Musculoskeletal: Positive for back pain.  Skin: Positive for wound.  Neurological: Positive for syncope and headaches.  All other systems reviewed and are negative.   Allergies  Ativan; Dilaudid; and Haloperidol decanoate  Home Medications   Prior to Admission medications   Medication Sig Start Date End Date Taking? Authorizing Provider  ALPRAZolam Prudy Feeler) 1 MG tablet Take 1 tablet (1 mg total) by mouth 3 (three) times daily as needed for anxiety. For anxiety Patient taking differently: Take 1 mg by mouth 3 (three) times daily. For anxiety 05/15/12   Kathlen Mody, MD  amLODipine (NORVASC) 5 MG tablet Take 10 mg by mouth daily.  08/06/14   Historical Provider, MD  aspirin 81 MG tablet Take 1 tablet (81 mg total) by mouth daily. 05/31/12   Kathlen Mody, MD  atorvastatin (LIPITOR) 10 MG tablet TAKE (1) TABLET BY MOUTH ONCE DAILY. 08/13/15   Jonelle Sidle, MD  budesonide-formoterol [facility]) 160-4.5 MCG/ACT inhaler Inhale 2 puffs into the lungs 2 (two) times daily.    Historical Provider, MD  citalopram (CELEXA) 10 MG tablet Take 10 mg by mouth every morning.    Historical Provider, MD  doxepin (SINEQUAN) 25 MG capsule Take 25 mg by mouth at bedtime.  08/06/14   Historical Provider, MD  furosemide (LASIX) 40 MG tablet Take 40 mg by mouth every morning.    Historical Provider, MD  hydrochlorothiazide (HYDRODIURIL) 25 MG tablet Take 25 mg by mouth every morning.  08/06/14   Historical Provider, MD  ipratropium-albuterol (DUONEB) 0.5-2.5 (3) MG/3ML SOLN Take 3 mLs by nebulization every 6 (six) hours as needed (shortness of breath/ wheezing.).     Historical Provider, MD  isosorbide mononitrate (IMDUR) 30 MG 24 hr  tablet Take 1 tablet (30 mg total) by mouth 2 (two) times daily. Patient taking differently: Take 30 mg by mouth every morning.  08/12/14   Nishant Dhungel, MD  meloxicam (MOBIC) 15 MG tablet Take 15 mg by mouth every morning.  08/13/13   Historical Provider, MD  nitroGLYCERIN (NITROSTAT) 0.4 MG SL tablet Place 0.4 mg under the tongue every 5 (five) minutes as needed. For chest pain    Historical Provider, MD  omeprazole (PRILOSEC) 20 MG capsule Take 20 mg by mouth every morning.     Historical Provider, MD  oxyCODONE-acetaminophen (PERCOCET) 10-325 MG per tablet Take 1 tablet by mouth 3 (three) times daily.  09/30/14   Historical Provider, MD  potassium chloride SA (K-DUR,KLOR-CON) 20 MEQ tablet  Take 20 mEq by mouth every morning.  08/13/13   Historical Provider, MD  PROAIR HFA 108 (90 BASE) MCG/ACT inhaler Inhale 2 puffs into the lungs daily as needed for wheezing or shortness of breath.  06/20/13   Historical Provider, MD  ranolazine (RANEXA) 500 MG 12 hr tablet Take 1 tablet (500 mg total) by mouth 2 (two) times daily. 08/12/14   Nishant Dhungel, MD  tiotropium (SPIRIVA) 18 MCG inhalation capsule Place 18 mcg into inhaler and inhale daily.    Historical Provider, MD  VOLTAREN 1 % GEL Apply 2 g topically daily as needed (pain).  02/27/15   Historical Provider, MD   BP 119/59 mmHg  Pulse 90  Temp(Src) 98.2 F (36.8 C) (Oral)  Resp 15  Ht 6\' 3"  (1.905 m)  Wt 264 lb (119.75 kg)  BMI 33.00 kg/m2  SpO2 97% Physical Exam CONSTITUTIONAL: Well developed/well nourished HEAD: Normocephalic/atraumatic, no signs of head trauma EYES: EOMI/PERRL ENMT: Mucous membranes moist NECK: supple no meningeal signs SPINE/BACK:entire spine nontender CV: S1/S2 noted, no murmurs/rubs/gallops noted LUNGS: Wheezing bilaterally CHEST: Wound noted on chest with good granulation tissue. No active bleeding or drainage.  ABDOMEN: soft, nontender, no rebound or guarding, bowel sounds noted throughout abdomen GU:no cva  tenderness NEURO: Pt is awake/alert/appropriate, moves all extremitiesx4.  No facial droop.   EXTREMITIES: pulses normal/equal, full ROM, symmetric pitting edema to BLE, no signs of trauma SKIN: warm, color normal PSYCH: no abnormalities of mood noted, alert and oriented to situation ED Course  Procedures  Medications  fentaNYL (SUBLIMAZE) injection 50 mcg (50 mcg Intravenous Given 02/25/16 0851)  fentaNYL (SUBLIMAZE) injection 50 mcg (50 mcg Intravenous Given 02/25/16 1024)    DIAGNOSTIC STUDIES: Oxygen Saturation is 97% on RA, normal by my interpretation.    COORDINATION OF CARE: 8:33 AM Discussed next steps with pt. Pt verbalized understanding and is agreeable with the plan.  Notes from [facility]: "Sternal Debridement performed on 01/06/2016 with OR cultures positive for staph (h/o CAD s/p CABG (RIMA to RCA) in 09/2015)" 10:53 AM Pt improved He has already been given ASA/NTG Will admit for chest pain r/o MI I doubt PE/Dissection at this time   Labs Review Labs Reviewed  BASIC METABOLIC PANEL - Abnormal; Notable for the following:    Sodium 134 (*)    Calcium 8.5 (*)    All other components within normal limits  CBC WITH DIFFERENTIAL/PLATELET - Abnormal; Notable for the following:    WBC 10.7 (*)    RBC 3.59 (*)    Hemoglobin 10.4 (*)    HCT 31.9 (*)    RDW 15.7 (*)    Monocytes Absolute 1.3 (*)    All other components within normal limits  TROPONIN I    Imaging Review Dg Chest Portable 1 View  02/25/2016  CLINICAL DATA:  Chest pain EXAM: PORTABLE CHEST 1 VIEW COMPARISON:  08/02/2015 FINDINGS: Chronic cardiopericardial enlargement. Chronic coarsening of right infrahilar lung markings. Hyperinflation correlating with COPD history. There is no edema, consolidation, effusion, or pneumothorax. IMPRESSION: Stable.  No evidence of acute cardiopulmonary disease. Electronically Signed   By: Marnee Spring M.D.   On: 02/25/2016 09:04   I have personally reviewed and evaluated  these images and lab results as part of my medical decision-making.   EKG Interpretation   Date/Time:  Wednesday February 25 2016 08:17:30 EDT Ventricular Rate:  88 PR Interval:    QRS Duration: 109 QT Interval:  449 QTC Calculation: 544 R Axis:   9 Text  Interpretation:  Sinus rhythm Low voltage, precordial leads Prolonged  QT interval Abnormal ekg Confirmed by Bebe Shaggy  MD, Dorinda Hill ([id number]) on  02/25/2016 8:42:05 AM      MDM   Final diagnoses:  Chest pain, rule out acute myocardial infarction    Nursing notes including past medical history and social history reviewed and considered in documentation xrays/imaging reviewed by myself and considered during evaluation Labs/vital reviewed myself and considered during evaluation   I personally performed the services described in this documentation, which was scribed in my presence. The recorded information has been reviewed and is accurate.        Zadie Rhine, MD 02/25/16 1054

## 2016-02-25 NOTE — H&P (Signed)
History and Physical  [name] HASHIMI HYQ:[id number] DOB: 1956-08-01 DOA: 02/25/2016  PCP: [name], MD  Patient coming from: home  Chief Complaint: chest pain, syncope, SOB, LE edema  HPI: 62 yom with a hx of obesity, NSTEMI,  COPD, and a triple bypass which was performed in 09/2015 at [facility], presents to the ED via EMS with complaints of moderate, sharp, left sided chest pressure which which he describes as a 7 of 10 pain, and onset just prior to admission. Initial troponin was negative and he was referred for chest pain evaluation.  Reports some cramping in his neck and jaw onset yesterday. Patient reports that he quit smoking and drinking roughly 8 months ago, but drank 2 beers and smoked last night. He also reports recently being exposed to the smoke of an unknown drug last night at a party. He woke up at 4:30am to use the restroom and began to experience chest pain, and a headache. After getting to the restroom he had an episode of syncope. He reports that his current chest pain feels similar to chest pain during past MI. Pain 7/10, some cramping in the left arm and neck. Improved with pain medication in the emergency department.  Per EMS, patient received 324 ASA and 1 NTG prior to admission.   Per [facility] note Ms. Rosette Reveal 6/29: "On 02/15/2016, patient reported to the ED with left chest pain with radiation to arm and neck. This was felt to be musculoskeletal in nature as it was reproducible with palpation and EKG/CBM were negative. Patient is asking for a refill for pain medication today. He states that his PCP will not refill pain medication while he is under Dr Elie Confer care... Patient's [place] controlled substance report was pulled and he has filled a prescription for oxy/APAP 10/325mg , 90 tablets (30 day supply) on 02/12/2016 (7 days ago). We will not prescribe more pain medication on top of this. Patient understands."  ED Course: While in the ED, BMP was shown to be unremarkable. CBC revealed mildly  elevated WBC, Hgb of 10.4. His troponin was found to be normal. CXR was stable and showed no evidence of acute cardiopulmonary disease. He was noted to be afebrile, and his vital signs were stable.   Pertinent labs: CXR negative. Troponin normal. BMP unremarkable, Hgb 10.4. WBC 10.7 EKG: Independently reviewed. SR, new QT prolongation Imaging: independently reviewed.  No acute changes  Review of Systems:  Negative for fever, visual changes, sore throat, rash, new muscle aches, SOB, dysuria, bleeding, n/v/abdominal pain.  Past Medical History  Diagnosis Date  . COPD (chronic obstructive pulmonary disease) (HCC)   . Essential hypertension, benign   . Chronic back pain   . Anxiety disorder   . Hyperlipidemia   . Depression   . Obesity   . History of alcohol abuse   . History of cocaine abuse   . Pneumonia   . GERD (gastroesophageal reflux disease)   . Headache(784.0)   . Arthritis   . CAD (coronary artery disease)     a. ~ 2000 cath at [facility];  b. 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  . CHF (congestive heart failure) (HCC)   . Chronic hip pain     Past Surgical History  Procedure Laterality Date  . Hip surgery      rt  . Cholecystectomy    .  Back surgery    . Nasal/sinus endoscopy    . Total hip arthroplasty  05/08/2012    Procedure: TOTAL HIP ARTHROPLASTY;  Surgeon: Nestor Lewandowsky, MD;  Location: [facility] OR;  Service: Orthopedics;  Laterality: Left;  . Joint replacement    . Left heart catheterization with coronary angiogram N/A 09/12/2013    Procedure: LEFT HEART CATHETERIZATION WITH CORONARY ANGIOGRAM;  Surgeon: Kathleene Hazel, MD;  Location: [facility] CATH LAB;  Service: Cardiovascular;  Laterality: N/A;  . Total hip arthroplasty  2014 per pt  . Coronary artery bypass graft       reports that he has been smoking Cigarettes.  He has a  19.5 pack-year smoking history. He has never used smokeless tobacco. He reports that he drinks alcohol. He reports that he does not use illicit drugs.   Allergies  Allergen Reactions  . Ativan [Lorazepam] Other (See Comments)    "out of mind"  . Dilaudid [Hydromorphone Hcl] Other (See Comments)    " out of mind"  . Haloperidol Decanoate     Family History  Problem Relation Age of Onset  . Heart attack Mother   . Heart attack Brother   . Heart attack Sister      Prior to Admission medications   Medication Sig Start Date End Date Taking? Authorizing Provider  ALPRAZolam Prudy Feeler) 1 MG tablet Take 1 tablet (1 mg total) by mouth 3 (three) times daily as needed for anxiety. For anxiety Patient taking differently: Take 1 mg by mouth 3 (three) times daily. For anxiety 05/15/12  Yes Kathlen Mody, MD  amiodarone (PACERONE) 200 MG tablet Take 1 tablet by mouth daily. 01/09/16  Yes Historical Provider, MD  amLODipine (NORVASC) 10 MG tablet Take 10 mg by mouth daily.   Yes Historical Provider, MD  aspirin 81 MG tablet Take 1 tablet (81 mg total) by mouth daily. 05/31/12  Yes Kathlen Mody, MD  atorvastatin (LIPITOR) 10 MG tablet TAKE (1) TABLET BY MOUTH ONCE DAILY. 08/13/15  Yes Jonelle Sidle, MD  budesonide-formoterol [facility]) 160-4.5 MCG/ACT inhaler Inhale 2 puffs into the lungs 2 (two) times daily.   Yes Historical Provider, MD  citalopram (CELEXA) 10 MG tablet Take 10 mg by mouth every morning.   Yes Historical Provider, MD  doxepin (SINEQUAN) 25 MG capsule Take 25 mg by mouth at bedtime.  08/06/14  Yes Historical Provider, MD  furosemide (LASIX) 40 MG tablet Take 40 mg by mouth every morning.   Yes Historical Provider, MD  hydrochlorothiazide (HYDRODIURIL) 25 MG tablet Take 25 mg by mouth every morning.  08/06/14  Yes Historical Provider, MD  ipratropium-albuterol (DUONEB) 0.5-2.5 (3) MG/3ML SOLN Take 3 mLs by nebulization every 6 (six) hours as needed (shortness of breath/ wheezing.).     Yes Historical Provider, MD  isosorbide mononitrate (IMDUR) 30 MG 24 hr tablet Take 1 tablet (30 mg total) by mouth 2 (two) times daily. Patient taking differently: Take 30 mg by mouth every morning.  08/12/14  Yes Nishant Dhungel, MD  meloxicam (MOBIC) 15 MG tablet Take 15 mg by mouth every morning.  08/13/13  Yes Historical Provider, MD  metoprolol tartrate (LOPRESSOR) 25 MG tablet Take 1 tablet by mouth daily. 01/09/16  Yes Historical Provider, MD  nitroGLYCERIN (NITROSTAT) 0.4 MG SL tablet Place 0.4 mg under the tongue every 5 (five) minutes as needed. For chest pain   Yes Historical Provider, MD  omeprazole (PRILOSEC) 20 MG capsule Take 20 mg by mouth every morning.    Yes  Historical Provider, MD  ondansetron (ZOFRAN) 4 MG tablet Take 1 tablet by mouth 2 (two) times daily as needed. 12/02/15  Yes Historical Provider, MD  oxyCODONE (OXY IR/ROXICODONE) 5 MG immediate release tablet Take 1 tablet by mouth daily as needed for moderate pain.  01/29/16  Yes Historical Provider, MD  oxyCODONE-acetaminophen (PERCOCET) 10-325 MG per tablet Take 1 tablet by mouth 3 (three) times daily.  09/30/14  Yes Historical Provider, MD  potassium chloride SA (K-DUR,KLOR-CON) 20 MEQ tablet Take 20 mEq by mouth every morning.  08/13/13  Yes Historical Provider, MD  PROAIR HFA 108 (90 BASE) MCG/ACT inhaler Inhale 2 puffs into the lungs daily as needed for wheezing or shortness of breath.  06/20/13  Yes Historical Provider, MD  ranolazine (RANEXA) 500 MG 12 hr tablet Take 1 tablet (500 mg total) by mouth 2 (two) times daily. 08/12/14  Yes Nishant Dhungel, MD  tamsulosin (FLOMAX) 0.4 MG CAPS capsule Take 1 capsule by mouth daily. 01/09/16  Yes Historical Provider, MD  tiotropium (SPIRIVA) 18 MCG inhalation capsule Place 18 mcg into inhaler and inhale daily.   Yes Historical Provider, MD  VITAMIN E PO Take 1 tablet by mouth daily.   Yes Historical Provider, MD  VOLTAREN 1 % GEL Apply 2 g topically daily as needed (pain).  02/27/15   Yes Historical Provider, MD    Physical Exam: Filed Vitals:   02/25/16 0900 02/25/16 0930 02/25/16 1030 02/25/16 1130  BP: 110/72 102/82 95/66 105/60  Pulse: 84 82 82   Temp:      TempSrc:      Resp: 19 15 16    Height:      Weight:      SpO2: 98% 99% 100%    Constitutional:  . Appears calm and comfortable Eyes:  . PERRL and irises appear normal . Normal conjunctivae and lids ENMT:  . external ears, nose appear normal . grossly normal hearing . Lips appear normal . Oropharynx: mucosa, tongue,posterior pharynx appear normal Respiratory:  . CTA bilaterally, no w/r/r.  . Respiratory effort normal. No retractions or accessory muscle use Cardiovascular:  . RRR, no m/r/g . 1+ bilateral LE extremity edema   Abdomen:  . Abdomen appears normal; no tenderness or masses . No hernias noted Skin:  . No rashes, lesions, ulcers; multiple tattoos over the upper body, multiple scars over the abdomen . palpation of skin: no induration or nodules Psychiatric:  . judgement and insight appear normal . Mental status o Mood, affect appropriate  Wt Readings from Last 3 Encounters:  02/25/16 119.75 kg (264 lb)  07/13/15 146.512 kg (323 lb)  04/30/15 160.573 kg (354 lb)    I have personally reviewed following labs and imaging studies  Labs on Admission:  CBC:  Recent Labs Lab 02/25/16 0859  WBC 10.7*  NEUTROABS 6.7  HGB 10.4*  HCT 31.9*  MCV 88.9  PLT 191   Basic Metabolic Panel:  Recent Labs Lab 02/25/16 0859  NA 134*  K 3.9  CL 105  CO2 22  GLUCOSE 86  BUN 9  CREATININE 1.00  CALCIUM 8.5*   Cardiac Enzymes:  Recent Labs Lab 02/25/16 0859  TROPONINI <0.03    Radiological Exams on Admission: Dg Chest Portable 1 View  02/25/2016  CLINICAL DATA:  Chest pain EXAM: PORTABLE CHEST 1 VIEW COMPARISON:  08/02/2015 FINDINGS: Chronic cardiopericardial enlargement. Chronic coarsening of right infrahilar lung markings. Hyperinflation correlating with COPD history. There  is no edema, consolidation, effusion, or pneumothorax. IMPRESSION: Stable.  No  evidence of acute cardiopulmonary disease. Electronically Signed   By: Marnee Spring M.D.   On: 02/25/2016 09:04    EKG: Independently reviewed. SR, new QT prolongation  Active Problems:   Chest pain, rule out acute myocardial infarction   Assessment/Plan 1. Chest pain with associated shortness of breath, nausea and syncope in the context of recent alcohol consumption. History suggest a typical pain. Initial troponin negative, EKG nonacute. Suspect musculoskeletal in nature. Doubt ACS. Recently evaluated at [facility] for chest pain which was felt to be musculoskeletal. 2. Syncope, suspect vasovagal by history in the context of alcohol consumption. 3. Infected CABG surgical wound. Notes from [facility]: "Sternal Debridement performed on 01/06/2016 with OR cultures positive for staph (h/o CAD s/p CABG (RIMA to RCA) in 09/2015)" 4. Coronary artery disease, MI, CABG 09/2015 at [facility] 5. COPD. Stable. No evidence of wheeze.  6. Alcohol use 7. Tobacco use disorder 8. PMH cocaine use, denies use 9. Obesity   Atypical pain in patient with known coronary artery disease.  Serial troponin, telemetry, observe overnight  Continue Imdur, Ranexa, beta blocker  If troponins negative and overall improved would anticipate discharge 7/6.   DVT prophylaxis: Lovenox Code Status: Full Family Communication: discussed with patient. No family present at bedside. Disposition Plan: admit to telemetry. Discharge home once improved   Consults called: [name] Admission status: Admit as observation.    Time spent: 55 minutes  Brendia Sacks, MD  [facility] Direct contact: [phone] --Via amion app OR  --[email or web address]; password [email or web address]  7PM-7AM contact night coverage as above  02/25/2016, 1:26 PM  By signing my name below, I, Adron Bene, attest that this documentation has been prepared under the direction and in the  presence of Daniel P. Irene Limbo, MD. Electronically Signed: Adron Bene, Scribe.  02/25/2016 2:00pm  I personally performed the services described in this documentation. All medical record entries made by the scribe were at my direction. I have reviewed the chart and agree that the record reflects my personal performance and is accurate and complete. Brendia Sacks, MD

## 2016-02-25 NOTE — ED Notes (Signed)
X-ray at bedside

## 2016-02-25 NOTE — ED Notes (Addendum)
Attempted report. Waiting on RN to arrive, states they will call shortly.

## 2016-02-25 NOTE — ED Notes (Signed)
Per EMS, pt reports LT sided chest pressure that radiates to LT arm with diaphoresis that began around 0430 this morning. Pt had triple bypass surgery in Feb. 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 Pt requesting to change dressing. Pt given 324 ASA and 1 Nitro PTA. EMS reports that after 1 nitro pt reports "feeling funny." NAD noted.

## 2016-02-25 NOTE — ED Notes (Signed)
At Dr. Nelda Marseille approval Patient given crackers, peanut butter and ginger ale Also, patient was given a denture cup.

## 2016-02-25 NOTE — ED Notes (Signed)
EDP at bedside  

## 2016-02-26 DIAGNOSIS — I2510 Atherosclerotic heart disease of native coronary artery without angina pectoris: Secondary | ICD-10-CM | POA: Diagnosis not present

## 2016-02-26 DIAGNOSIS — E669 Obesity, unspecified: Secondary | ICD-10-CM | POA: Diagnosis not present

## 2016-02-26 DIAGNOSIS — I110 Hypertensive heart disease with heart failure: Secondary | ICD-10-CM | POA: Diagnosis not present

## 2016-02-26 DIAGNOSIS — R0789 Other chest pain: Secondary | ICD-10-CM | POA: Diagnosis not present

## 2016-02-26 DIAGNOSIS — E785 Hyperlipidemia, unspecified: Secondary | ICD-10-CM | POA: Diagnosis not present

## 2016-02-26 DIAGNOSIS — R079 Chest pain, unspecified: Secondary | ICD-10-CM | POA: Diagnosis not present

## 2016-02-26 DIAGNOSIS — I509 Heart failure, unspecified: Secondary | ICD-10-CM | POA: Diagnosis not present

## 2016-02-26 DIAGNOSIS — M1990 Unspecified osteoarthritis, unspecified site: Secondary | ICD-10-CM | POA: Diagnosis not present

## 2016-02-26 DIAGNOSIS — F329 Major depressive disorder, single episode, unspecified: Secondary | ICD-10-CM | POA: Diagnosis not present

## 2016-02-26 DIAGNOSIS — J449 Chronic obstructive pulmonary disease, unspecified: Secondary | ICD-10-CM | POA: Diagnosis not present

## 2016-02-26 LAB — BASIC METABOLIC PANEL
ANION GAP: 5 (ref 5–15)
BUN: 10 mg/dL (ref 6–20)
CO2: 27 mmol/L (ref 22–32)
Calcium: 8.5 mg/dL — ABNORMAL LOW (ref 8.9–10.3)
Chloride: 106 mmol/L (ref 101–111)
Creatinine, Ser: 1 mg/dL (ref 0.61–1.24)
GFR calc Af Amer: >60 mL/min (ref >60)
GFR calc non Af Amer: >60 mL/min (ref >60)
GLUCOSE: 108 mg/dL — AB (ref 65–99)
POTASSIUM: 3.9 mmol/L (ref 3.5–5.1)
Sodium: 138 mmol/L (ref 135–145)

## 2016-02-26 LAB — MAGNESIUM: Magnesium: 2 mg/dL (ref 1.7–2.4)

## 2016-02-26 LAB — MRSA PCR SCREENING: MRSA BY PCR: NEGATIVE

## 2016-02-26 LAB — TROPONIN I

## 2016-02-26 LAB — PHOSPHORUS: Phosphorus: 4.6 mg/dL (ref 2.5–4.6)

## 2016-02-26 NOTE — Progress Notes (Signed)
PROGRESS NOTE  [name] XQK:[id number] DOB: 12-21-1955 DOA: 02/25/2016 PCP: [name], MD  Brief Narrative: 70 yom presented with complaints of moderate sharp left sided CP. While in the ED, his troponin was found to be normal and CXR was stable. He was admitted for further management. Toxicology screen returned positive for benzos and cocaine.   Assessment/Plan: 1. Atypical chest pain with associated shortness of breath, nausea and syncope in the context of recent alcohol consumption. Urine drug chart and positive cocaine, patient denies. History and examination suggests musculoskeletal etiology. ACS ruled out. EKG nonacute. Recently evaluated at [facility] for chest pain which was felt to be musculoskeletal.  2. Syncope, suspect vasovagal by history in the context of alcohol consumption. Tox screen positive for benzos and cocaine. Urine drug screen positive for cocaine, patient denies, but was at a party where there were drugs. 3. Infected CABG surgical wound. Notes from [facility]: "Sternal Debridement performed on 01/06/2016 with OR cultures positive for staph (h/o CAD s/p CABG (RIMA to RCA) in 09/2015)" 4. Coronary artery disease, MI, CABG 09/2015 at [facility] 5. COPD. Stable. No evidence of wheeze.  6. Alcohol use 7. Tobacco use disorder 8. PMH cocaine use, denies use 9. Obesity   Overall improved, chest pain is reproducible with palpation consistent with previous diagnosis of musculoskeletal pain. Troponins negative, ACS ruled out. No further evaluation suggested at this time.  DVT prophylaxis: Lovenox Code Status: Full Family Communication: discussed with patient. No family present at bedside. Disposition Plan: Discharge home   Brendia Sacks, MD  [facility] Direct contact: [phone] --Via amion app OR  --[email or web address]; password [email or web address]  7PM-7AM contact night coverage as above 02/26/2016, 6:56 AM     Consultants:  none  Procedures:  none  Antimicrobials:  none  HPI/Subjective: Better today. Still has some pain in the left side of his chest wall and left arm. No vomiting. Tolerating diet. Breathing fine.  Objective: Filed Vitals:   02/25/16 1421 02/25/16 1611 02/25/16 2144 02/26/16 0627  BP: 105/59 104/47 110/62 118/58  Pulse: 73 71 68 72  Temp: 97.7 F (36.5 C)  98 F (36.7 C) 98.4 F (36.9 C)  TempSrc: Oral  Oral Oral  Resp: 18  18 20   Height:      Weight:      SpO2: 100%  100% 100%    Intake/Output Summary (Last 24 hours) at 02/26/16 0656 Last data filed at 02/26/16 0158  Gross per 24 hour  Intake    480 ml  Output   3050 ml  Net  -2570 ml     Filed Weights   02/25/16 0821  Weight: 119.75 kg (264 lb)    Exam: Constitutional:  . Appears calm and comfortable Respiratory:  . CTA bilaterally, no w/r/r.  . Respiratory effort normal. No retractions or accessory muscle use Cardiovascular:  . RRR, no m/r/g . No significant LE extremity edema   . Telemetry SR Musculoskeletal:  o Chest wall pain with palpation Psychiatric:  . judgement and insight appear normal . Mental status o Mood, affect appropriate  I have personally reviewed following labs and imaging studies:  BMP unremarkable  Troponins negative  Urine drug screen positive for cocaine and benzodiazepines  EKG sinus rhythm no acute changes, QTC has normalized  Scheduled Meds: . amiodarone  200 mg Oral Daily  . amLODipine  10 mg Oral Daily  . aspirin EC  81 mg Oral Daily  . atorvastatin  10 mg Oral q1800  . citalopram  10 mg Oral q morning - 10a  . doxepin  25 mg Oral QHS  . feeding supplement (ENSURE ENLIVE)  237 mL Oral BID BM  . furosemide  40 mg Oral q morning - 10a  . hydrochlorothiazide  25 mg Oral q morning - 10a  . isosorbide mononitrate  30 mg Oral BID  . metoprolol tartrate  25 mg Oral BID  . mometasone-formoterol  2 puff Inhalation BID  . pantoprazole  40 mg Oral  Daily  . potassium chloride SA  20 mEq Oral q morning - 10a  . ranolazine  500 mg Oral BID  . tamsulosin  0.4 mg Oral Daily  . tiotropium  18 mcg Inhalation Daily   Continuous Infusions:   Active Problems:   Chest pain   Chest pain, rule out acute myocardial infarction   By signing my name below, I, Adron Bene, attest that this documentation has been prepared under the direction and in the presence of Daniel P. Irene Limbo, MD. Electronically Signed: Adron Bene, Scribe.  02/26/2016 11:47am     I personally performed the services described in this documentation. All medical record entries made by the scribe were at my direction. I have reviewed the chart and agree that the record reflects my personal performance and is accurate and complete. Brendia Sacks, MD

## 2016-02-26 NOTE — Care Management Note (Signed)
Case Management Note  Patient Details  Name: [name] MRN: [id number] Date of Birth: 03-08-1956  Subjective/Objective: Patient is from home,  ind with ADL's. He lives in [place], is here visiting his sister. He is active with a home health agency for dressing changes, s/p debridement of CABG incision infection on 01/06/2016.  Patient is unsure of [facility] agency but thinks [facility] sounds familiar. Will check with [facility] to verify. Patient does not know his PCP name. He reports no issues obtaining medications.    Action/Plan: Anticipate d/c home with self care. No CM needs.    Expected Discharge Date:                 02/26/2016 Expected Discharge Plan:  Home w Home Health Services  In-House Referral:  NA  Discharge planning Services  CM Consult  Post Acute Care Choice:  Resumption of Svcs/PTA Provider Choice offered to:     DME Arranged:    DME Agency:     HH Arranged:    HH Agency:     Status of Service:  In process, will continue to follow  If discussed at Long Length of Stay Meetings, dates discussed:    Additional Comments:  Shene Maxfield, Chrystine Oiler, RN 02/26/2016, 9:10 AM

## 2016-02-26 NOTE — Care Management Obs Status (Signed)
MEDICARE OBSERVATION STATUS NOTIFICATION   Patient Details  Name: TAEQUAN DELIC MRN: [id number] Date of Birth: [birth year]   Medicare Observation Status Notification Given:  Yes    Shade Kaley, Chrystine Oiler, RN 02/26/2016, 10:12 AM

## 2016-02-26 NOTE — Progress Notes (Signed)
Discharge instructions given, verbalized understanding, out in stable condition via w/c with staff. 

## 2016-02-26 NOTE — Discharge Summary (Signed)
Physician Discharge Summary  [name] OHY:[id number] DOB: [birth year] DOA: 02/25/2016  PCP: [name], MD  Admit date: 02/25/2016 Discharge date: 02/26/2016  Recommendations for Outpatient Follow-up:  1. Follow-up atypical chest pain with PCP 2. Keep follow-up appointment with thoracic surgeon for wound care 3. Continue to encourage abstinence from drugs  Follow-up Information    Follow up with [name], MD On 03/04/2016.   Specialty:  Internal Medicine   Why:  at 2:00 pm   Contact information:   [address] [place] [address] [phone]      Discharge Diagnoses:  1. Atypical chest pain  2. Syncope, Vasovagal 3. PMH CAD, MI, CABG  4. COPD 5. Alcohol use 6. Tobacco use disorder 7. Urine drug screen positive for cocaine 8. Obesity   Discharge Condition: improved Disposition: discharge home  Diet recommendation: heart healthy  Filed Weights   02/25/16 0821  Weight: 119.75 kg (264 lb)    History of present illness:  55 yom presented with complaints of moderate sharp left sided CP. While in the ED, his troponin was found to be normal and CXR was stable. He was admitted for further management.   Hospital Course:  Patient was admitted overnight, troponins were negative, telemetry unremarkable, repeat EKG showed sinus rhythm with no acute changes. Chest pain improved, reproducible with palpation of the chest wall, consistent with previous diagnosis of musculoskeletal chest pain. No concerning features noted. Patient improved today and stable for discharge. Hospital course was uncomplicated. Individual issues as below.  1. Atypical chest pain with associated shortness of breath, nausea and syncope in the context of recent alcohol consumption. Urine drug chart and positive cocaine, patient denies. History and examination suggests musculoskeletal etiology. ACS ruled out. EKG nonacute. Recently evaluated at [facility] for chest pain which was felt to be  musculoskeletal.  2. Syncope, suspect vasovagal by history in the context of alcohol consumption. Tox screen positive for benzos and cocaine. Urine drug screen positive for cocaine, patient denies, but was at a party where there were drugs. 3. Infected CABG surgical wound. Notes from [facility]: "Sternal Debridement performed on 01/06/2016 with OR cultures positive for staph (h/o CAD s/p CABG (RIMA to RCA) in 09/2015)" 4. Coronary artery disease, MI, CABG 09/2015 at [facility] 5. COPD. Stable. No evidence of wheeze.  6. Alcohol use 7. Tobacco use disorder 8. PMH cocaine use, denies use 9. Obesity  Consultants:  none  Procedures:  none  Antimicrobials:  None  Discharge Instructions  Discharge Instructions    Activity as tolerated - No restrictions    Complete by:  As directed      Diet - low sodium heart healthy    Complete by:  As directed      Discharge instructions    Complete by:  As directed   Call your physician or seek immediate medical attention for chest pain, shortness of breath, wheezing or worsening of condition.          Current Discharge Medication List    CONTINUE these medications which have NOT CHANGED   Details  ALPRAZolam (XANAX) 1 MG tablet Take 1 tablet (1 mg total) by mouth 3 (three) times daily as needed for anxiety. For anxiety Qty: 30 tablet, Refills: 0    amiodarone (PACERONE) 200 MG tablet Take 1 tablet by mouth daily.    amLODipine (NORVASC) 10 MG tablet Take 10 mg by mouth daily.    aspirin 81 MG tablet Take 1 tablet (81 mg  total) by mouth daily. Qty: 30 tablet, Refills: 0    atorvastatin (LIPITOR) 10 MG tablet TAKE (1) TABLET BY MOUTH ONCE DAILY. Qty: 30 tablet, Refills: 6    budesonide-formoterol (SYMBICORT) 160-4.5 MCG/ACT inhaler Inhale 2 puffs into the lungs 2 (two) times daily.    citalopram (CELEXA) 10 MG tablet Take 10 mg by mouth every morning.    doxepin (SINEQUAN) 25 MG capsule Take 25 mg by mouth at bedtime.       furosemide (LASIX) 40 MG tablet Take 40 mg by mouth every morning.    hydrochlorothiazide (HYDRODIURIL) 25 MG tablet Take 25 mg by mouth every morning.     ipratropium-albuterol (DUONEB) 0.5-2.5 (3) MG/3ML SOLN Take 3 mLs by nebulization every 6 (six) hours as needed (shortness of breath/ wheezing.).     isosorbide mononitrate (IMDUR) 30 MG 24 hr tablet Take 1 tablet (30 mg total) by mouth 2 (two) times daily. Qty: 60 tablet, Refills: 0    meloxicam (MOBIC) 15 MG tablet Take 15 mg by mouth every morning.     metoprolol tartrate (LOPRESSOR) 25 MG tablet Take 1 tablet by mouth daily.    nitroGLYCERIN (NITROSTAT) 0.4 MG SL tablet Place 0.4 mg under the tongue every 5 (five) minutes as needed. For chest pain    omeprazole (PRILOSEC) 20 MG capsule Take 20 mg by mouth every morning.     ondansetron (ZOFRAN) 4 MG tablet Take 1 tablet by mouth 2 (two) times daily as needed.    oxyCODONE-acetaminophen (PERCOCET) 10-325 MG per tablet Take 1 tablet by mouth 3 (three) times daily.     potassium chloride SA (K-DUR,KLOR-CON) 20 MEQ tablet Take 20 mEq by mouth every morning.     PROAIR HFA 108 (90 BASE) MCG/ACT inhaler Inhale 2 puffs into the lungs daily as needed for wheezing or shortness of breath.     ranolazine (RANEXA) 500 MG 12 hr tablet Take 1 tablet (500 mg total) by mouth 2 (two) times daily. Qty: 60 tablet, Refills: 0    tamsulosin (FLOMAX) 0.4 MG CAPS capsule Take 1 capsule by mouth daily.    tiotropium (SPIRIVA) 18 MCG inhalation capsule Place 18 mcg into inhaler and inhale daily.    VITAMIN E PO Take 1 tablet by mouth daily.    VOLTAREN 1 % GEL Apply 2 g topically daily as needed (pain).       STOP taking these medications     oxyCODONE (OXY IR/ROXICODONE) 5 MG immediate release tablet        Allergies  Allergen Reactions  . Ativan [Lorazepam] Other (See Comments)    "out of mind"  . Dilaudid [Hydromorphone Hcl] Other (See Comments)    " out of mind"  . Haloperidol  Decanoate     The results of significant diagnostics from this hospitalization (including imaging, microbiology, ancillary and laboratory) are listed below for reference.    Significant Diagnostic Studies: Dg Chest Portable 1 View  02/25/2016  CLINICAL DATA:  Chest pain EXAM: PORTABLE CHEST 1 VIEW COMPARISON:  08/02/2015 FINDINGS: Chronic cardiopericardial enlargement. Chronic coarsening of right infrahilar lung markings. Hyperinflation correlating with COPD history. There is no edema, consolidation, effusion, or pneumothorax. IMPRESSION: Stable.  No evidence of acute cardiopulmonary disease. Electronically Signed   By: Marnee Spring M.D.   On: 02/25/2016 09:04    Microbiology: Recent Results (from the past 240 hour(s))  MRSA PCR Screening     Status: None   Collection Time: 02/26/16  9:40 AM  Result Value Ref Range  Status   MRSA by PCR NEGATIVE NEGATIVE Final    Comment:        The GeneXpert MRSA Assay (FDA approved for NASAL specimens only), is one component of a comprehensive MRSA colonization surveillance program. It is not intended to diagnose MRSA infection nor to guide or monitor treatment for MRSA infections.      Labs: Basic Metabolic Panel:  Recent Labs Lab 02/25/16 0859 02/26/16 0212  NA 134* 138  K 3.9 3.9  CL 105 106  CO2 22 27  GLUCOSE 86 108*  BUN 9 10  CREATININE 1.00 1.00  CALCIUM 8.5* 8.5*  MG  --  2.0  PHOS  --  4.6   CBC:  Recent Labs Lab 02/25/16 0859  WBC 10.7*  NEUTROABS 6.7  HGB 10.4*  HCT 31.9*  MCV 88.9  PLT 191   Cardiac Enzymes:  Recent Labs Lab 02/25/16 0859 02/25/16 1520 02/25/16 2053 02/26/16 0212  TROPONINI <0.03 <0.03 <0.03 <0.03    Time coordinating discharge: 35 minutes  Signed:  Brendia Sacks, MD [facility] 02/26/2016, 3:50 PM  By signing my name below, I, Adron Bene, attest that this documentation has been prepared under the direction and in the presence of [facility] P. Irene Limbo,  MD. Electronically Signed: Adron Bene, Scribe.  02/26/2016 11:47 am  I personally performed the services described in this documentation. All medical record entries made by the scribe were at my direction. I have reviewed the chart and agree that the record reflects my personal performance and is accurate and complete. Brendia Sacks, MD

## 2016-02-27 DIAGNOSIS — M25562 Pain in left knee: Secondary | ICD-10-CM | POA: Diagnosis not present

## 2016-02-27 DIAGNOSIS — G4733 Obstructive sleep apnea (adult) (pediatric): Secondary | ICD-10-CM | POA: Diagnosis not present

## 2016-02-27 DIAGNOSIS — F339 Major depressive disorder, recurrent, unspecified: Secondary | ICD-10-CM | POA: Diagnosis not present

## 2016-02-27 DIAGNOSIS — S0990XA Unspecified injury of head, initial encounter: Secondary | ICD-10-CM | POA: Diagnosis not present

## 2016-02-27 DIAGNOSIS — F419 Anxiety disorder, unspecified: Secondary | ICD-10-CM | POA: Diagnosis not present

## 2016-02-27 DIAGNOSIS — T814XXA Infection following a procedure, initial encounter: Secondary | ICD-10-CM | POA: Diagnosis not present

## 2016-02-27 DIAGNOSIS — I10 Essential (primary) hypertension: Secondary | ICD-10-CM | POA: Diagnosis not present

## 2016-02-27 DIAGNOSIS — I2510 Atherosclerotic heart disease of native coronary artery without angina pectoris: Secondary | ICD-10-CM | POA: Diagnosis not present

## 2016-02-27 DIAGNOSIS — J449 Chronic obstructive pulmonary disease, unspecified: Secondary | ICD-10-CM | POA: Diagnosis not present

## 2016-02-27 DIAGNOSIS — I440 Atrioventricular block, first degree: Secondary | ICD-10-CM | POA: Diagnosis not present

## 2016-02-27 DIAGNOSIS — R0789 Other chest pain: Secondary | ICD-10-CM | POA: Diagnosis not present

## 2016-02-27 DIAGNOSIS — Z79899 Other long term (current) drug therapy: Secondary | ICD-10-CM | POA: Diagnosis not present

## 2016-02-27 DIAGNOSIS — R4020 Unspecified coma: Secondary | ICD-10-CM | POA: Diagnosis not present

## 2016-02-27 DIAGNOSIS — Z23 Encounter for immunization: Secondary | ICD-10-CM | POA: Diagnosis not present

## 2016-02-27 DIAGNOSIS — S80212A Abrasion, left knee, initial encounter: Secondary | ICD-10-CM | POA: Diagnosis not present

## 2016-02-27 DIAGNOSIS — R55 Syncope and collapse: Secondary | ICD-10-CM | POA: Diagnosis not present

## 2016-02-27 DIAGNOSIS — R001 Bradycardia, unspecified: Secondary | ICD-10-CM | POA: Diagnosis not present

## 2016-02-27 DIAGNOSIS — R0602 Shortness of breath: Secondary | ICD-10-CM | POA: Diagnosis not present

## 2016-02-27 DIAGNOSIS — Y33XXXA Other specified events, undetermined intent, initial encounter: Secondary | ICD-10-CM | POA: Diagnosis not present

## 2016-02-27 DIAGNOSIS — Z7982 Long term (current) use of aspirin: Secondary | ICD-10-CM | POA: Diagnosis not present

## 2016-02-27 DIAGNOSIS — G8929 Other chronic pain: Secondary | ICD-10-CM | POA: Diagnosis not present

## 2016-02-27 DIAGNOSIS — R9431 Abnormal electrocardiogram [ECG] [EKG]: Secondary | ICD-10-CM | POA: Diagnosis not present

## 2016-02-27 DIAGNOSIS — Z87891 Personal history of nicotine dependence: Secondary | ICD-10-CM | POA: Diagnosis not present

## 2016-02-27 DIAGNOSIS — R079 Chest pain, unspecified: Secondary | ICD-10-CM | POA: Diagnosis not present

## 2016-02-28 DIAGNOSIS — R0789 Other chest pain: Secondary | ICD-10-CM | POA: Diagnosis not present

## 2016-02-28 DIAGNOSIS — R55 Syncope and collapse: Secondary | ICD-10-CM | POA: Diagnosis not present

## 2016-02-28 DIAGNOSIS — M25562 Pain in left knee: Secondary | ICD-10-CM | POA: Diagnosis not present

## 2016-03-01 DIAGNOSIS — T814XXA Infection following a procedure, initial encounter: Secondary | ICD-10-CM | POA: Diagnosis not present

## 2016-03-01 DIAGNOSIS — G4733 Obstructive sleep apnea (adult) (pediatric): Secondary | ICD-10-CM | POA: Diagnosis not present

## 2016-03-01 DIAGNOSIS — F419 Anxiety disorder, unspecified: Secondary | ICD-10-CM | POA: Diagnosis not present

## 2016-03-01 DIAGNOSIS — G8929 Other chronic pain: Secondary | ICD-10-CM | POA: Diagnosis not present

## 2016-03-01 DIAGNOSIS — I2510 Atherosclerotic heart disease of native coronary artery without angina pectoris: Secondary | ICD-10-CM | POA: Diagnosis not present

## 2016-03-01 DIAGNOSIS — J449 Chronic obstructive pulmonary disease, unspecified: Secondary | ICD-10-CM | POA: Diagnosis not present

## 2016-03-01 DIAGNOSIS — I10 Essential (primary) hypertension: Secondary | ICD-10-CM | POA: Diagnosis not present

## 2016-03-01 DIAGNOSIS — F339 Major depressive disorder, recurrent, unspecified: Secondary | ICD-10-CM | POA: Diagnosis not present

## 2016-03-03 DIAGNOSIS — F419 Anxiety disorder, unspecified: Secondary | ICD-10-CM | POA: Diagnosis not present

## 2016-03-03 DIAGNOSIS — G4733 Obstructive sleep apnea (adult) (pediatric): Secondary | ICD-10-CM | POA: Diagnosis not present

## 2016-03-03 DIAGNOSIS — J449 Chronic obstructive pulmonary disease, unspecified: Secondary | ICD-10-CM | POA: Diagnosis not present

## 2016-03-03 DIAGNOSIS — T814XXA Infection following a procedure, initial encounter: Secondary | ICD-10-CM | POA: Diagnosis not present

## 2016-03-03 DIAGNOSIS — G8929 Other chronic pain: Secondary | ICD-10-CM | POA: Diagnosis not present

## 2016-03-03 DIAGNOSIS — F339 Major depressive disorder, recurrent, unspecified: Secondary | ICD-10-CM | POA: Diagnosis not present

## 2016-03-03 DIAGNOSIS — I2510 Atherosclerotic heart disease of native coronary artery without angina pectoris: Secondary | ICD-10-CM | POA: Diagnosis not present

## 2016-03-03 DIAGNOSIS — I10 Essential (primary) hypertension: Secondary | ICD-10-CM | POA: Diagnosis not present

## 2016-03-04 DIAGNOSIS — Z5189 Encounter for other specified aftercare: Secondary | ICD-10-CM | POA: Diagnosis not present

## 2016-03-05 DIAGNOSIS — Z79899 Other long term (current) drug therapy: Secondary | ICD-10-CM | POA: Diagnosis not present

## 2016-03-05 DIAGNOSIS — Z87891 Personal history of nicotine dependence: Secondary | ICD-10-CM | POA: Diagnosis not present

## 2016-03-05 DIAGNOSIS — R079 Chest pain, unspecified: Secondary | ICD-10-CM | POA: Diagnosis not present

## 2016-03-05 DIAGNOSIS — J9811 Atelectasis: Secondary | ICD-10-CM | POA: Diagnosis not present

## 2016-03-05 DIAGNOSIS — R0789 Other chest pain: Secondary | ICD-10-CM | POA: Diagnosis not present

## 2016-03-05 DIAGNOSIS — Z9889 Other specified postprocedural states: Secondary | ICD-10-CM | POA: Diagnosis not present

## 2016-03-05 DIAGNOSIS — I517 Cardiomegaly: Secondary | ICD-10-CM | POA: Diagnosis not present

## 2016-03-05 DIAGNOSIS — I313 Pericardial effusion (noninflammatory): Secondary | ICD-10-CM | POA: Diagnosis not present

## 2016-03-05 DIAGNOSIS — Z5181 Encounter for therapeutic drug level monitoring: Secondary | ICD-10-CM | POA: Diagnosis not present

## 2016-03-05 DIAGNOSIS — Z7982 Long term (current) use of aspirin: Secondary | ICD-10-CM | POA: Diagnosis not present

## 2016-03-05 DIAGNOSIS — R0602 Shortness of breath: Secondary | ICD-10-CM | POA: Diagnosis not present

## 2016-03-09 DIAGNOSIS — I10 Essential (primary) hypertension: Secondary | ICD-10-CM | POA: Diagnosis not present

## 2016-03-09 DIAGNOSIS — I959 Hypotension, unspecified: Secondary | ICD-10-CM | POA: Diagnosis not present

## 2016-03-09 DIAGNOSIS — T814XXA Infection following a procedure, initial encounter: Secondary | ICD-10-CM | POA: Diagnosis not present

## 2016-03-09 DIAGNOSIS — I517 Cardiomegaly: Secondary | ICD-10-CM | POA: Diagnosis not present

## 2016-03-09 DIAGNOSIS — Z481 Encounter for planned postprocedural wound closure: Secondary | ICD-10-CM | POA: Diagnosis not present

## 2016-03-09 DIAGNOSIS — J449 Chronic obstructive pulmonary disease, unspecified: Secondary | ICD-10-CM | POA: Diagnosis not present

## 2016-03-09 DIAGNOSIS — R918 Other nonspecific abnormal finding of lung field: Secondary | ICD-10-CM | POA: Diagnosis not present

## 2016-03-09 DIAGNOSIS — M549 Dorsalgia, unspecified: Secondary | ICD-10-CM | POA: Diagnosis not present

## 2016-03-09 DIAGNOSIS — T8189XA Other complications of procedures, not elsewhere classified, initial encounter: Secondary | ICD-10-CM | POA: Diagnosis not present

## 2016-03-09 DIAGNOSIS — G8929 Other chronic pain: Secondary | ICD-10-CM | POA: Diagnosis not present

## 2016-03-09 DIAGNOSIS — T814XXD Infection following a procedure, subsequent encounter: Secondary | ICD-10-CM | POA: Diagnosis not present

## 2016-03-09 DIAGNOSIS — I252 Old myocardial infarction: Secondary | ICD-10-CM | POA: Diagnosis not present

## 2016-03-09 DIAGNOSIS — Z951 Presence of aortocoronary bypass graft: Secondary | ICD-10-CM | POA: Diagnosis not present

## 2016-03-09 DIAGNOSIS — Y33XXXD Other specified events, undetermined intent, subsequent encounter: Secondary | ICD-10-CM | POA: Diagnosis not present

## 2016-03-09 DIAGNOSIS — I2510 Atherosclerotic heart disease of native coronary artery without angina pectoris: Secondary | ICD-10-CM | POA: Diagnosis not present

## 2016-03-09 DIAGNOSIS — R55 Syncope and collapse: Secondary | ICD-10-CM | POA: Diagnosis not present

## 2016-03-09 DIAGNOSIS — T8132XA Disruption of internal operation (surgical) wound, not elsewhere classified, initial encounter: Secondary | ICD-10-CM | POA: Diagnosis not present

## 2016-03-09 DIAGNOSIS — I951 Orthostatic hypotension: Secondary | ICD-10-CM | POA: Diagnosis not present

## 2016-03-09 DIAGNOSIS — G252 Other specified forms of tremor: Secondary | ICD-10-CM | POA: Diagnosis not present

## 2016-03-19 DIAGNOSIS — R55 Syncope and collapse: Secondary | ICD-10-CM | POA: Diagnosis not present

## 2016-03-20 DIAGNOSIS — J449 Chronic obstructive pulmonary disease, unspecified: Secondary | ICD-10-CM | POA: Diagnosis not present

## 2016-03-20 DIAGNOSIS — I4891 Unspecified atrial fibrillation: Secondary | ICD-10-CM | POA: Diagnosis not present

## 2016-03-20 DIAGNOSIS — G8929 Other chronic pain: Secondary | ICD-10-CM | POA: Diagnosis not present

## 2016-03-20 DIAGNOSIS — Z452 Encounter for adjustment and management of vascular access device: Secondary | ICD-10-CM | POA: Diagnosis not present

## 2016-03-20 DIAGNOSIS — I10 Essential (primary) hypertension: Secondary | ICD-10-CM | POA: Diagnosis not present

## 2016-03-20 DIAGNOSIS — F419 Anxiety disorder, unspecified: Secondary | ICD-10-CM | POA: Diagnosis not present

## 2016-03-20 DIAGNOSIS — E785 Hyperlipidemia, unspecified: Secondary | ICD-10-CM | POA: Diagnosis not present

## 2016-03-20 DIAGNOSIS — F339 Major depressive disorder, recurrent, unspecified: Secondary | ICD-10-CM | POA: Diagnosis not present

## 2016-03-20 DIAGNOSIS — I2510 Atherosclerotic heart disease of native coronary artery without angina pectoris: Secondary | ICD-10-CM | POA: Diagnosis not present

## 2016-03-20 DIAGNOSIS — T814XXA Infection following a procedure, initial encounter: Secondary | ICD-10-CM | POA: Diagnosis not present

## 2016-03-22 DIAGNOSIS — E785 Hyperlipidemia, unspecified: Secondary | ICD-10-CM | POA: Diagnosis not present

## 2016-03-22 DIAGNOSIS — I10 Essential (primary) hypertension: Secondary | ICD-10-CM | POA: Diagnosis not present

## 2016-03-22 DIAGNOSIS — J449 Chronic obstructive pulmonary disease, unspecified: Secondary | ICD-10-CM | POA: Diagnosis not present

## 2016-03-22 DIAGNOSIS — I2510 Atherosclerotic heart disease of native coronary artery without angina pectoris: Secondary | ICD-10-CM | POA: Diagnosis not present

## 2016-03-22 DIAGNOSIS — I4891 Unspecified atrial fibrillation: Secondary | ICD-10-CM | POA: Diagnosis not present

## 2016-03-22 DIAGNOSIS — F339 Major depressive disorder, recurrent, unspecified: Secondary | ICD-10-CM | POA: Diagnosis not present

## 2016-03-22 DIAGNOSIS — T814XXA Infection following a procedure, initial encounter: Secondary | ICD-10-CM | POA: Diagnosis not present

## 2016-03-22 DIAGNOSIS — G8929 Other chronic pain: Secondary | ICD-10-CM | POA: Diagnosis not present

## 2016-03-22 DIAGNOSIS — F419 Anxiety disorder, unspecified: Secondary | ICD-10-CM | POA: Diagnosis not present

## 2016-03-23 DIAGNOSIS — J440 Chronic obstructive pulmonary disease with acute lower respiratory infection: Secondary | ICD-10-CM | POA: Diagnosis not present

## 2016-03-23 DIAGNOSIS — I25810 Atherosclerosis of coronary artery bypass graft(s) without angina pectoris: Secondary | ICD-10-CM | POA: Diagnosis not present

## 2016-03-23 DIAGNOSIS — M545 Low back pain: Secondary | ICD-10-CM | POA: Diagnosis not present

## 2016-03-23 DIAGNOSIS — Z6834 Body mass index (BMI) 34.0-34.9, adult: Secondary | ICD-10-CM | POA: Diagnosis not present

## 2016-03-25 DIAGNOSIS — R69 Illness, unspecified: Secondary | ICD-10-CM | POA: Diagnosis not present

## 2016-03-25 DIAGNOSIS — R0789 Other chest pain: Secondary | ICD-10-CM | POA: Diagnosis not present

## 2016-03-25 DIAGNOSIS — Z7982 Long term (current) use of aspirin: Secondary | ICD-10-CM | POA: Diagnosis not present

## 2016-03-25 DIAGNOSIS — Z79899 Other long term (current) drug therapy: Secondary | ICD-10-CM | POA: Diagnosis not present

## 2016-03-25 DIAGNOSIS — Z9889 Other specified postprocedural states: Secondary | ICD-10-CM | POA: Diagnosis not present

## 2016-03-29 DIAGNOSIS — I10 Essential (primary) hypertension: Secondary | ICD-10-CM | POA: Diagnosis not present

## 2016-03-29 DIAGNOSIS — G8929 Other chronic pain: Secondary | ICD-10-CM | POA: Diagnosis not present

## 2016-03-29 DIAGNOSIS — F339 Major depressive disorder, recurrent, unspecified: Secondary | ICD-10-CM | POA: Diagnosis not present

## 2016-03-29 DIAGNOSIS — I4891 Unspecified atrial fibrillation: Secondary | ICD-10-CM | POA: Diagnosis not present

## 2016-03-29 DIAGNOSIS — T814XXA Infection following a procedure, initial encounter: Secondary | ICD-10-CM | POA: Diagnosis not present

## 2016-03-29 DIAGNOSIS — J449 Chronic obstructive pulmonary disease, unspecified: Secondary | ICD-10-CM | POA: Diagnosis not present

## 2016-03-29 DIAGNOSIS — E785 Hyperlipidemia, unspecified: Secondary | ICD-10-CM | POA: Diagnosis not present

## 2016-03-29 DIAGNOSIS — I2510 Atherosclerotic heart disease of native coronary artery without angina pectoris: Secondary | ICD-10-CM | POA: Diagnosis not present

## 2016-03-29 DIAGNOSIS — F419 Anxiety disorder, unspecified: Secondary | ICD-10-CM | POA: Diagnosis not present

## 2016-04-01 DIAGNOSIS — Z5189 Encounter for other specified aftercare: Secondary | ICD-10-CM | POA: Diagnosis not present

## 2016-04-07 DIAGNOSIS — F3340 Major depressive disorder, recurrent, in remission, unspecified: Secondary | ICD-10-CM | POA: Diagnosis not present

## 2016-04-07 DIAGNOSIS — Z951 Presence of aortocoronary bypass graft: Secondary | ICD-10-CM | POA: Diagnosis not present

## 2016-04-07 DIAGNOSIS — T40601D Poisoning by unspecified narcotics, accidental (unintentional), subsequent encounter: Secondary | ICD-10-CM | POA: Diagnosis not present

## 2016-04-07 DIAGNOSIS — Z79891 Long term (current) use of opiate analgesic: Secondary | ICD-10-CM | POA: Diagnosis not present

## 2016-04-07 DIAGNOSIS — Z9889 Other specified postprocedural states: Secondary | ICD-10-CM | POA: Diagnosis not present

## 2016-04-07 DIAGNOSIS — R079 Chest pain, unspecified: Secondary | ICD-10-CM | POA: Diagnosis not present

## 2016-04-07 DIAGNOSIS — F1190 Opioid use, unspecified, uncomplicated: Secondary | ICD-10-CM | POA: Diagnosis not present

## 2016-04-07 DIAGNOSIS — R42 Dizziness and giddiness: Secondary | ICD-10-CM | POA: Diagnosis not present

## 2016-04-07 DIAGNOSIS — G8929 Other chronic pain: Secondary | ICD-10-CM | POA: Diagnosis not present

## 2016-04-07 DIAGNOSIS — Z79899 Other long term (current) drug therapy: Secondary | ICD-10-CM | POA: Diagnosis not present

## 2016-04-07 DIAGNOSIS — R072 Precordial pain: Secondary | ICD-10-CM | POA: Diagnosis not present

## 2016-04-07 DIAGNOSIS — Z5181 Encounter for therapeutic drug level monitoring: Secondary | ICD-10-CM | POA: Diagnosis not present

## 2016-04-07 DIAGNOSIS — R110 Nausea: Secondary | ICD-10-CM | POA: Diagnosis not present

## 2016-04-07 DIAGNOSIS — R0602 Shortness of breath: Secondary | ICD-10-CM | POA: Diagnosis not present

## 2016-04-07 DIAGNOSIS — I440 Atrioventricular block, first degree: Secondary | ICD-10-CM | POA: Diagnosis not present

## 2016-04-07 DIAGNOSIS — Z87898 Personal history of other specified conditions: Secondary | ICD-10-CM | POA: Diagnosis not present

## 2016-04-07 DIAGNOSIS — Z87891 Personal history of nicotine dependence: Secondary | ICD-10-CM | POA: Diagnosis not present

## 2016-04-14 DIAGNOSIS — E785 Hyperlipidemia, unspecified: Secondary | ICD-10-CM | POA: Diagnosis not present

## 2016-04-14 DIAGNOSIS — T814XXA Infection following a procedure, initial encounter: Secondary | ICD-10-CM | POA: Diagnosis not present

## 2016-04-14 DIAGNOSIS — I4891 Unspecified atrial fibrillation: Secondary | ICD-10-CM | POA: Diagnosis not present

## 2016-04-14 DIAGNOSIS — F419 Anxiety disorder, unspecified: Secondary | ICD-10-CM | POA: Diagnosis not present

## 2016-04-14 DIAGNOSIS — I2510 Atherosclerotic heart disease of native coronary artery without angina pectoris: Secondary | ICD-10-CM | POA: Diagnosis not present

## 2016-04-14 DIAGNOSIS — I10 Essential (primary) hypertension: Secondary | ICD-10-CM | POA: Diagnosis not present

## 2016-04-14 DIAGNOSIS — F339 Major depressive disorder, recurrent, unspecified: Secondary | ICD-10-CM | POA: Diagnosis not present

## 2016-04-14 DIAGNOSIS — J449 Chronic obstructive pulmonary disease, unspecified: Secondary | ICD-10-CM | POA: Diagnosis not present

## 2016-04-14 DIAGNOSIS — G8929 Other chronic pain: Secondary | ICD-10-CM | POA: Diagnosis not present

## 2016-04-17 DIAGNOSIS — R0789 Other chest pain: Secondary | ICD-10-CM | POA: Diagnosis not present

## 2016-04-17 DIAGNOSIS — E8779 Other fluid overload: Secondary | ICD-10-CM | POA: Diagnosis not present

## 2016-04-17 DIAGNOSIS — Z87891 Personal history of nicotine dependence: Secondary | ICD-10-CM | POA: Diagnosis not present

## 2016-04-17 DIAGNOSIS — R079 Chest pain, unspecified: Secondary | ICD-10-CM | POA: Diagnosis not present

## 2016-04-17 DIAGNOSIS — R0609 Other forms of dyspnea: Secondary | ICD-10-CM | POA: Diagnosis not present

## 2016-04-17 DIAGNOSIS — J811 Chronic pulmonary edema: Secondary | ICD-10-CM | POA: Diagnosis not present

## 2016-04-17 DIAGNOSIS — R918 Other nonspecific abnormal finding of lung field: Secondary | ICD-10-CM | POA: Diagnosis not present

## 2016-04-17 DIAGNOSIS — R001 Bradycardia, unspecified: Secondary | ICD-10-CM | POA: Diagnosis not present

## 2016-04-17 DIAGNOSIS — D72829 Elevated white blood cell count, unspecified: Secondary | ICD-10-CM | POA: Diagnosis not present

## 2016-04-17 DIAGNOSIS — Z9889 Other specified postprocedural states: Secondary | ICD-10-CM | POA: Diagnosis not present

## 2016-04-17 DIAGNOSIS — Z951 Presence of aortocoronary bypass graft: Secondary | ICD-10-CM | POA: Diagnosis not present

## 2016-04-17 DIAGNOSIS — R0602 Shortness of breath: Secondary | ICD-10-CM | POA: Diagnosis not present

## 2016-04-17 DIAGNOSIS — R110 Nausea: Secondary | ICD-10-CM | POA: Diagnosis not present

## 2016-04-17 DIAGNOSIS — Z7982 Long term (current) use of aspirin: Secondary | ICD-10-CM | POA: Diagnosis not present

## 2016-04-17 DIAGNOSIS — J9811 Atelectasis: Secondary | ICD-10-CM | POA: Diagnosis not present

## 2016-04-17 DIAGNOSIS — I10 Essential (primary) hypertension: Secondary | ICD-10-CM | POA: Diagnosis not present

## 2016-04-17 DIAGNOSIS — Z5181 Encounter for therapeutic drug level monitoring: Secondary | ICD-10-CM | POA: Diagnosis not present

## 2016-04-18 DIAGNOSIS — R918 Other nonspecific abnormal finding of lung field: Secondary | ICD-10-CM | POA: Diagnosis not present

## 2016-04-18 DIAGNOSIS — E43 Unspecified severe protein-calorie malnutrition: Secondary | ICD-10-CM | POA: Diagnosis not present

## 2016-04-18 DIAGNOSIS — L02213 Cutaneous abscess of chest wall: Secondary | ICD-10-CM | POA: Diagnosis not present

## 2016-04-18 DIAGNOSIS — Z5189 Encounter for other specified aftercare: Secondary | ICD-10-CM | POA: Diagnosis not present

## 2016-04-18 DIAGNOSIS — I4891 Unspecified atrial fibrillation: Secondary | ICD-10-CM | POA: Diagnosis not present

## 2016-04-18 DIAGNOSIS — D509 Iron deficiency anemia, unspecified: Secondary | ICD-10-CM | POA: Diagnosis not present

## 2016-04-18 DIAGNOSIS — I471 Supraventricular tachycardia: Secondary | ICD-10-CM | POA: Diagnosis not present

## 2016-04-18 DIAGNOSIS — T814XXD Infection following a procedure, subsequent encounter: Secondary | ICD-10-CM | POA: Diagnosis not present

## 2016-04-18 DIAGNOSIS — I5030 Unspecified diastolic (congestive) heart failure: Secondary | ICD-10-CM | POA: Diagnosis not present

## 2016-04-18 DIAGNOSIS — R262 Difficulty in walking, not elsewhere classified: Secondary | ICD-10-CM | POA: Diagnosis not present

## 2016-04-18 DIAGNOSIS — J449 Chronic obstructive pulmonary disease, unspecified: Secondary | ICD-10-CM | POA: Diagnosis not present

## 2016-04-18 DIAGNOSIS — I10 Essential (primary) hypertension: Secondary | ICD-10-CM | POA: Diagnosis not present

## 2016-04-18 DIAGNOSIS — I110 Hypertensive heart disease with heart failure: Secondary | ICD-10-CM | POA: Diagnosis not present

## 2016-04-18 DIAGNOSIS — B3789 Other sites of candidiasis: Secondary | ICD-10-CM | POA: Diagnosis not present

## 2016-04-18 DIAGNOSIS — J42 Unspecified chronic bronchitis: Secondary | ICD-10-CM | POA: Diagnosis not present

## 2016-04-18 DIAGNOSIS — E785 Hyperlipidemia, unspecified: Secondary | ICD-10-CM | POA: Diagnosis not present

## 2016-04-18 DIAGNOSIS — B957 Other staphylococcus as the cause of diseases classified elsewhere: Secondary | ICD-10-CM | POA: Diagnosis not present

## 2016-04-18 DIAGNOSIS — Z7401 Bed confinement status: Secondary | ICD-10-CM | POA: Diagnosis not present

## 2016-04-18 DIAGNOSIS — T814XXA Infection following a procedure, initial encounter: Secondary | ICD-10-CM | POA: Diagnosis not present

## 2016-04-18 DIAGNOSIS — I509 Heart failure, unspecified: Secondary | ICD-10-CM | POA: Diagnosis not present

## 2016-04-18 DIAGNOSIS — R7881 Bacteremia: Secondary | ICD-10-CM | POA: Diagnosis not present

## 2016-04-18 DIAGNOSIS — Q245 Malformation of coronary vessels: Secondary | ICD-10-CM | POA: Diagnosis not present

## 2016-04-18 DIAGNOSIS — F339 Major depressive disorder, recurrent, unspecified: Secondary | ICD-10-CM | POA: Diagnosis not present

## 2016-04-18 DIAGNOSIS — J9851 Mediastinitis: Secondary | ICD-10-CM | POA: Diagnosis not present

## 2016-04-18 DIAGNOSIS — Z452 Encounter for adjustment and management of vascular access device: Secondary | ICD-10-CM | POA: Diagnosis not present

## 2016-04-23 DIAGNOSIS — E43 Unspecified severe protein-calorie malnutrition: Secondary | ICD-10-CM | POA: Diagnosis not present

## 2016-04-23 DIAGNOSIS — I5030 Unspecified diastolic (congestive) heart failure: Secondary | ICD-10-CM | POA: Diagnosis not present

## 2016-04-23 DIAGNOSIS — D649 Anemia, unspecified: Secondary | ICD-10-CM | POA: Diagnosis not present

## 2016-04-23 DIAGNOSIS — L02213 Cutaneous abscess of chest wall: Secondary | ICD-10-CM | POA: Diagnosis not present

## 2016-04-23 DIAGNOSIS — T814XXD Infection following a procedure, subsequent encounter: Secondary | ICD-10-CM | POA: Diagnosis not present

## 2016-04-23 DIAGNOSIS — R7881 Bacteremia: Secondary | ICD-10-CM | POA: Diagnosis not present

## 2016-04-23 DIAGNOSIS — Z951 Presence of aortocoronary bypass graft: Secondary | ICD-10-CM | POA: Diagnosis not present

## 2016-04-23 DIAGNOSIS — I4891 Unspecified atrial fibrillation: Secondary | ICD-10-CM | POA: Diagnosis not present

## 2016-04-23 DIAGNOSIS — J449 Chronic obstructive pulmonary disease, unspecified: Secondary | ICD-10-CM | POA: Diagnosis not present

## 2016-04-23 DIAGNOSIS — J853 Abscess of mediastinum: Secondary | ICD-10-CM | POA: Diagnosis not present

## 2016-04-23 DIAGNOSIS — M6281 Muscle weakness (generalized): Secondary | ICD-10-CM | POA: Diagnosis not present

## 2016-04-23 DIAGNOSIS — I5020 Unspecified systolic (congestive) heart failure: Secondary | ICD-10-CM | POA: Diagnosis not present

## 2016-04-23 DIAGNOSIS — J42 Unspecified chronic bronchitis: Secondary | ICD-10-CM | POA: Diagnosis not present

## 2016-04-23 DIAGNOSIS — I509 Heart failure, unspecified: Secondary | ICD-10-CM | POA: Diagnosis not present

## 2016-04-23 DIAGNOSIS — Z7401 Bed confinement status: Secondary | ICD-10-CM | POA: Diagnosis not present

## 2016-04-23 DIAGNOSIS — I10 Essential (primary) hypertension: Secondary | ICD-10-CM | POA: Diagnosis not present

## 2016-04-23 DIAGNOSIS — D509 Iron deficiency anemia, unspecified: Secondary | ICD-10-CM | POA: Diagnosis not present

## 2016-04-23 DIAGNOSIS — I2510 Atherosclerotic heart disease of native coronary artery without angina pectoris: Secondary | ICD-10-CM | POA: Diagnosis not present

## 2016-04-23 DIAGNOSIS — T8189XA Other complications of procedures, not elsewhere classified, initial encounter: Secondary | ICD-10-CM | POA: Diagnosis not present

## 2016-04-23 DIAGNOSIS — Z5189 Encounter for other specified aftercare: Secondary | ICD-10-CM | POA: Diagnosis not present

## 2016-04-23 DIAGNOSIS — L03313 Cellulitis of chest wall: Secondary | ICD-10-CM | POA: Diagnosis not present

## 2016-04-23 DIAGNOSIS — R531 Weakness: Secondary | ICD-10-CM | POA: Diagnosis not present

## 2016-04-23 DIAGNOSIS — R262 Difficulty in walking, not elsewhere classified: Secondary | ICD-10-CM | POA: Diagnosis not present

## 2016-04-23 DIAGNOSIS — F339 Major depressive disorder, recurrent, unspecified: Secondary | ICD-10-CM | POA: Diagnosis not present

## 2016-04-26 DIAGNOSIS — L02213 Cutaneous abscess of chest wall: Secondary | ICD-10-CM | POA: Diagnosis not present

## 2016-04-26 DIAGNOSIS — R531 Weakness: Secondary | ICD-10-CM | POA: Diagnosis not present

## 2016-04-26 DIAGNOSIS — I5030 Unspecified diastolic (congestive) heart failure: Secondary | ICD-10-CM | POA: Diagnosis not present

## 2016-04-26 DIAGNOSIS — L03313 Cellulitis of chest wall: Secondary | ICD-10-CM | POA: Diagnosis not present

## 2016-04-26 DIAGNOSIS — I2510 Atherosclerotic heart disease of native coronary artery without angina pectoris: Secondary | ICD-10-CM | POA: Diagnosis not present

## 2016-05-05 DIAGNOSIS — T814XXD Infection following a procedure, subsequent encounter: Secondary | ICD-10-CM | POA: Diagnosis not present

## 2016-05-05 DIAGNOSIS — Z951 Presence of aortocoronary bypass graft: Secondary | ICD-10-CM | POA: Diagnosis not present

## 2016-05-05 DIAGNOSIS — I2510 Atherosclerotic heart disease of native coronary artery without angina pectoris: Secondary | ICD-10-CM | POA: Diagnosis not present

## 2016-05-05 DIAGNOSIS — I5020 Unspecified systolic (congestive) heart failure: Secondary | ICD-10-CM | POA: Diagnosis not present

## 2016-05-06 DIAGNOSIS — R7881 Bacteremia: Secondary | ICD-10-CM | POA: Diagnosis not present

## 2016-05-07 DIAGNOSIS — I10 Essential (primary) hypertension: Secondary | ICD-10-CM | POA: Diagnosis not present

## 2016-05-07 DIAGNOSIS — I4891 Unspecified atrial fibrillation: Secondary | ICD-10-CM | POA: Diagnosis not present

## 2016-05-07 DIAGNOSIS — I2510 Atherosclerotic heart disease of native coronary artery without angina pectoris: Secondary | ICD-10-CM | POA: Diagnosis not present

## 2016-05-07 DIAGNOSIS — F419 Anxiety disorder, unspecified: Secondary | ICD-10-CM | POA: Diagnosis not present

## 2016-05-07 DIAGNOSIS — F339 Major depressive disorder, recurrent, unspecified: Secondary | ICD-10-CM | POA: Diagnosis not present

## 2016-05-07 DIAGNOSIS — T814XXA Infection following a procedure, initial encounter: Secondary | ICD-10-CM | POA: Diagnosis not present

## 2016-05-07 DIAGNOSIS — G8929 Other chronic pain: Secondary | ICD-10-CM | POA: Diagnosis not present

## 2016-05-07 DIAGNOSIS — E785 Hyperlipidemia, unspecified: Secondary | ICD-10-CM | POA: Diagnosis not present

## 2016-05-07 DIAGNOSIS — R7881 Bacteremia: Secondary | ICD-10-CM | POA: Diagnosis not present

## 2016-05-07 DIAGNOSIS — J449 Chronic obstructive pulmonary disease, unspecified: Secondary | ICD-10-CM | POA: Diagnosis not present

## 2016-05-08 DIAGNOSIS — R7881 Bacteremia: Secondary | ICD-10-CM | POA: Diagnosis not present

## 2016-05-09 DIAGNOSIS — R0789 Other chest pain: Secondary | ICD-10-CM | POA: Diagnosis not present

## 2016-05-09 DIAGNOSIS — Z951 Presence of aortocoronary bypass graft: Secondary | ICD-10-CM | POA: Diagnosis not present

## 2016-05-09 DIAGNOSIS — Z87891 Personal history of nicotine dependence: Secondary | ICD-10-CM | POA: Diagnosis not present

## 2016-05-09 DIAGNOSIS — Z7982 Long term (current) use of aspirin: Secondary | ICD-10-CM | POA: Diagnosis not present

## 2016-05-09 DIAGNOSIS — R001 Bradycardia, unspecified: Secondary | ICD-10-CM | POA: Diagnosis not present

## 2016-05-09 DIAGNOSIS — I440 Atrioventricular block, first degree: Secondary | ICD-10-CM | POA: Diagnosis not present

## 2016-05-09 DIAGNOSIS — R52 Pain, unspecified: Secondary | ICD-10-CM | POA: Diagnosis not present

## 2016-05-09 DIAGNOSIS — Z79899 Other long term (current) drug therapy: Secondary | ICD-10-CM | POA: Diagnosis not present

## 2016-05-09 DIAGNOSIS — Z79891 Long term (current) use of opiate analgesic: Secondary | ICD-10-CM | POA: Diagnosis not present

## 2016-05-09 DIAGNOSIS — Z5181 Encounter for therapeutic drug level monitoring: Secondary | ICD-10-CM | POA: Diagnosis not present

## 2016-05-09 DIAGNOSIS — R7881 Bacteremia: Secondary | ICD-10-CM | POA: Diagnosis not present

## 2016-05-09 DIAGNOSIS — R079 Chest pain, unspecified: Secondary | ICD-10-CM | POA: Diagnosis not present

## 2016-05-10 DIAGNOSIS — F419 Anxiety disorder, unspecified: Secondary | ICD-10-CM | POA: Diagnosis not present

## 2016-05-10 DIAGNOSIS — F339 Major depressive disorder, recurrent, unspecified: Secondary | ICD-10-CM | POA: Diagnosis not present

## 2016-05-10 DIAGNOSIS — I4891 Unspecified atrial fibrillation: Secondary | ICD-10-CM | POA: Diagnosis not present

## 2016-05-10 DIAGNOSIS — J449 Chronic obstructive pulmonary disease, unspecified: Secondary | ICD-10-CM | POA: Diagnosis not present

## 2016-05-10 DIAGNOSIS — Z452 Encounter for adjustment and management of vascular access device: Secondary | ICD-10-CM | POA: Diagnosis not present

## 2016-05-10 DIAGNOSIS — I2510 Atherosclerotic heart disease of native coronary artery without angina pectoris: Secondary | ICD-10-CM | POA: Diagnosis not present

## 2016-05-10 DIAGNOSIS — T814XXA Infection following a procedure, initial encounter: Secondary | ICD-10-CM | POA: Diagnosis not present

## 2016-05-10 DIAGNOSIS — G8929 Other chronic pain: Secondary | ICD-10-CM | POA: Diagnosis not present

## 2016-05-10 DIAGNOSIS — I10 Essential (primary) hypertension: Secondary | ICD-10-CM | POA: Diagnosis not present

## 2016-05-10 DIAGNOSIS — R7881 Bacteremia: Secondary | ICD-10-CM | POA: Diagnosis not present

## 2016-05-11 DIAGNOSIS — L02213 Cutaneous abscess of chest wall: Secondary | ICD-10-CM | POA: Diagnosis not present

## 2016-05-11 DIAGNOSIS — R7881 Bacteremia: Secondary | ICD-10-CM | POA: Diagnosis not present

## 2016-05-11 DIAGNOSIS — M6281 Muscle weakness (generalized): Secondary | ICD-10-CM | POA: Diagnosis not present

## 2016-05-11 DIAGNOSIS — T8189XA Other complications of procedures, not elsewhere classified, initial encounter: Secondary | ICD-10-CM | POA: Diagnosis not present

## 2016-05-12 DIAGNOSIS — G8929 Other chronic pain: Secondary | ICD-10-CM | POA: Diagnosis not present

## 2016-05-12 DIAGNOSIS — J449 Chronic obstructive pulmonary disease, unspecified: Secondary | ICD-10-CM | POA: Diagnosis not present

## 2016-05-12 DIAGNOSIS — I10 Essential (primary) hypertension: Secondary | ICD-10-CM | POA: Diagnosis not present

## 2016-05-12 DIAGNOSIS — I4891 Unspecified atrial fibrillation: Secondary | ICD-10-CM | POA: Diagnosis not present

## 2016-05-12 DIAGNOSIS — T814XXA Infection following a procedure, initial encounter: Secondary | ICD-10-CM | POA: Diagnosis not present

## 2016-05-12 DIAGNOSIS — Z452 Encounter for adjustment and management of vascular access device: Secondary | ICD-10-CM | POA: Diagnosis not present

## 2016-05-12 DIAGNOSIS — F419 Anxiety disorder, unspecified: Secondary | ICD-10-CM | POA: Diagnosis not present

## 2016-05-12 DIAGNOSIS — I2510 Atherosclerotic heart disease of native coronary artery without angina pectoris: Secondary | ICD-10-CM | POA: Diagnosis not present

## 2016-05-12 DIAGNOSIS — F339 Major depressive disorder, recurrent, unspecified: Secondary | ICD-10-CM | POA: Diagnosis not present

## 2016-05-12 DIAGNOSIS — R7881 Bacteremia: Secondary | ICD-10-CM | POA: Diagnosis not present

## 2016-05-13 DIAGNOSIS — R7881 Bacteremia: Secondary | ICD-10-CM | POA: Diagnosis not present

## 2016-05-14 DIAGNOSIS — F419 Anxiety disorder, unspecified: Secondary | ICD-10-CM | POA: Diagnosis not present

## 2016-05-14 DIAGNOSIS — I10 Essential (primary) hypertension: Secondary | ICD-10-CM | POA: Diagnosis not present

## 2016-05-14 DIAGNOSIS — G8929 Other chronic pain: Secondary | ICD-10-CM | POA: Diagnosis not present

## 2016-05-14 DIAGNOSIS — I2510 Atherosclerotic heart disease of native coronary artery without angina pectoris: Secondary | ICD-10-CM | POA: Diagnosis not present

## 2016-05-14 DIAGNOSIS — I4891 Unspecified atrial fibrillation: Secondary | ICD-10-CM | POA: Diagnosis not present

## 2016-05-14 DIAGNOSIS — E785 Hyperlipidemia, unspecified: Secondary | ICD-10-CM | POA: Diagnosis not present

## 2016-05-14 DIAGNOSIS — F339 Major depressive disorder, recurrent, unspecified: Secondary | ICD-10-CM | POA: Diagnosis not present

## 2016-05-14 DIAGNOSIS — R7881 Bacteremia: Secondary | ICD-10-CM | POA: Diagnosis not present

## 2016-05-14 DIAGNOSIS — J449 Chronic obstructive pulmonary disease, unspecified: Secondary | ICD-10-CM | POA: Diagnosis not present

## 2016-05-14 DIAGNOSIS — T814XXA Infection following a procedure, initial encounter: Secondary | ICD-10-CM | POA: Diagnosis not present

## 2016-05-15 DIAGNOSIS — R7881 Bacteremia: Secondary | ICD-10-CM | POA: Diagnosis not present

## 2016-05-16 DIAGNOSIS — R7881 Bacteremia: Secondary | ICD-10-CM | POA: Diagnosis not present

## 2016-05-17 DIAGNOSIS — J449 Chronic obstructive pulmonary disease, unspecified: Secondary | ICD-10-CM | POA: Diagnosis not present

## 2016-05-17 DIAGNOSIS — G8929 Other chronic pain: Secondary | ICD-10-CM | POA: Diagnosis not present

## 2016-05-17 DIAGNOSIS — F419 Anxiety disorder, unspecified: Secondary | ICD-10-CM | POA: Diagnosis not present

## 2016-05-17 DIAGNOSIS — I10 Essential (primary) hypertension: Secondary | ICD-10-CM | POA: Diagnosis not present

## 2016-05-17 DIAGNOSIS — Z452 Encounter for adjustment and management of vascular access device: Secondary | ICD-10-CM | POA: Diagnosis not present

## 2016-05-17 DIAGNOSIS — I4891 Unspecified atrial fibrillation: Secondary | ICD-10-CM | POA: Diagnosis not present

## 2016-05-17 DIAGNOSIS — I2510 Atherosclerotic heart disease of native coronary artery without angina pectoris: Secondary | ICD-10-CM | POA: Diagnosis not present

## 2016-05-17 DIAGNOSIS — T814XXA Infection following a procedure, initial encounter: Secondary | ICD-10-CM | POA: Diagnosis not present

## 2016-05-17 DIAGNOSIS — F339 Major depressive disorder, recurrent, unspecified: Secondary | ICD-10-CM | POA: Diagnosis not present

## 2016-05-17 DIAGNOSIS — R7881 Bacteremia: Secondary | ICD-10-CM | POA: Diagnosis not present

## 2016-05-18 DIAGNOSIS — R7881 Bacteremia: Secondary | ICD-10-CM | POA: Diagnosis not present

## 2016-05-19 DIAGNOSIS — F419 Anxiety disorder, unspecified: Secondary | ICD-10-CM | POA: Diagnosis not present

## 2016-05-19 DIAGNOSIS — F339 Major depressive disorder, recurrent, unspecified: Secondary | ICD-10-CM | POA: Diagnosis not present

## 2016-05-19 DIAGNOSIS — I4891 Unspecified atrial fibrillation: Secondary | ICD-10-CM | POA: Diagnosis not present

## 2016-05-19 DIAGNOSIS — G8929 Other chronic pain: Secondary | ICD-10-CM | POA: Diagnosis not present

## 2016-05-19 DIAGNOSIS — I10 Essential (primary) hypertension: Secondary | ICD-10-CM | POA: Diagnosis not present

## 2016-05-19 DIAGNOSIS — T814XXA Infection following a procedure, initial encounter: Secondary | ICD-10-CM | POA: Diagnosis not present

## 2016-05-19 DIAGNOSIS — I2510 Atherosclerotic heart disease of native coronary artery without angina pectoris: Secondary | ICD-10-CM | POA: Diagnosis not present

## 2016-05-19 DIAGNOSIS — Z452 Encounter for adjustment and management of vascular access device: Secondary | ICD-10-CM | POA: Diagnosis not present

## 2016-05-19 DIAGNOSIS — J449 Chronic obstructive pulmonary disease, unspecified: Secondary | ICD-10-CM | POA: Diagnosis not present

## 2016-05-19 DIAGNOSIS — R7881 Bacteremia: Secondary | ICD-10-CM | POA: Diagnosis not present

## 2016-05-20 DIAGNOSIS — T814XXD Infection following a procedure, subsequent encounter: Secondary | ICD-10-CM | POA: Diagnosis not present

## 2016-05-20 DIAGNOSIS — B9689 Other specified bacterial agents as the cause of diseases classified elsewhere: Secondary | ICD-10-CM | POA: Diagnosis not present

## 2016-05-20 DIAGNOSIS — R7881 Bacteremia: Secondary | ICD-10-CM | POA: Diagnosis not present

## 2016-05-20 DIAGNOSIS — R0789 Other chest pain: Secondary | ICD-10-CM | POA: Diagnosis not present

## 2016-05-20 DIAGNOSIS — Z9889 Other specified postprocedural states: Secondary | ICD-10-CM | POA: Diagnosis not present

## 2016-05-20 DIAGNOSIS — R918 Other nonspecific abnormal finding of lung field: Secondary | ICD-10-CM | POA: Diagnosis not present

## 2016-05-21 DIAGNOSIS — R7881 Bacteremia: Secondary | ICD-10-CM | POA: Diagnosis not present

## 2016-05-21 DIAGNOSIS — Z87891 Personal history of nicotine dependence: Secondary | ICD-10-CM | POA: Diagnosis not present

## 2016-05-21 DIAGNOSIS — Z5181 Encounter for therapeutic drug level monitoring: Secondary | ICD-10-CM | POA: Diagnosis not present

## 2016-05-21 DIAGNOSIS — R0602 Shortness of breath: Secondary | ICD-10-CM | POA: Diagnosis not present

## 2016-05-21 DIAGNOSIS — F339 Major depressive disorder, recurrent, unspecified: Secondary | ICD-10-CM | POA: Diagnosis not present

## 2016-05-21 DIAGNOSIS — J449 Chronic obstructive pulmonary disease, unspecified: Secondary | ICD-10-CM | POA: Diagnosis not present

## 2016-05-21 DIAGNOSIS — I2510 Atherosclerotic heart disease of native coronary artery without angina pectoris: Secondary | ICD-10-CM | POA: Diagnosis not present

## 2016-05-21 DIAGNOSIS — R001 Bradycardia, unspecified: Secondary | ICD-10-CM | POA: Diagnosis not present

## 2016-05-21 DIAGNOSIS — I10 Essential (primary) hypertension: Secondary | ICD-10-CM | POA: Diagnosis not present

## 2016-05-21 DIAGNOSIS — F419 Anxiety disorder, unspecified: Secondary | ICD-10-CM | POA: Diagnosis not present

## 2016-05-21 DIAGNOSIS — T8131XA Disruption of external operation (surgical) wound, not elsewhere classified, initial encounter: Secondary | ICD-10-CM | POA: Diagnosis not present

## 2016-05-21 DIAGNOSIS — Z452 Encounter for adjustment and management of vascular access device: Secondary | ICD-10-CM | POA: Diagnosis not present

## 2016-05-21 DIAGNOSIS — I4891 Unspecified atrial fibrillation: Secondary | ICD-10-CM | POA: Diagnosis not present

## 2016-05-21 DIAGNOSIS — J439 Emphysema, unspecified: Secondary | ICD-10-CM | POA: Diagnosis not present

## 2016-05-21 DIAGNOSIS — T814XXA Infection following a procedure, initial encounter: Secondary | ICD-10-CM | POA: Diagnosis not present

## 2016-05-21 DIAGNOSIS — R918 Other nonspecific abnormal finding of lung field: Secondary | ICD-10-CM | POA: Diagnosis not present

## 2016-05-21 DIAGNOSIS — I440 Atrioventricular block, first degree: Secondary | ICD-10-CM | POA: Diagnosis not present

## 2016-05-21 DIAGNOSIS — J9851 Mediastinitis: Secondary | ICD-10-CM | POA: Diagnosis not present

## 2016-05-21 DIAGNOSIS — Z9049 Acquired absence of other specified parts of digestive tract: Secondary | ICD-10-CM | POA: Diagnosis not present

## 2016-05-21 DIAGNOSIS — R079 Chest pain, unspecified: Secondary | ICD-10-CM | POA: Diagnosis not present

## 2016-05-21 DIAGNOSIS — G8929 Other chronic pain: Secondary | ICD-10-CM | POA: Diagnosis not present

## 2016-05-21 DIAGNOSIS — R0789 Other chest pain: Secondary | ICD-10-CM | POA: Diagnosis not present

## 2016-05-22 DIAGNOSIS — R7881 Bacteremia: Secondary | ICD-10-CM | POA: Diagnosis not present

## 2016-05-23 DIAGNOSIS — R7881 Bacteremia: Secondary | ICD-10-CM | POA: Diagnosis not present

## 2016-05-24 DIAGNOSIS — I4891 Unspecified atrial fibrillation: Secondary | ICD-10-CM | POA: Diagnosis not present

## 2016-05-24 DIAGNOSIS — F419 Anxiety disorder, unspecified: Secondary | ICD-10-CM | POA: Diagnosis not present

## 2016-05-24 DIAGNOSIS — I2510 Atherosclerotic heart disease of native coronary artery without angina pectoris: Secondary | ICD-10-CM | POA: Diagnosis not present

## 2016-05-24 DIAGNOSIS — G8929 Other chronic pain: Secondary | ICD-10-CM | POA: Diagnosis not present

## 2016-05-24 DIAGNOSIS — R7881 Bacteremia: Secondary | ICD-10-CM | POA: Diagnosis not present

## 2016-05-24 DIAGNOSIS — F339 Major depressive disorder, recurrent, unspecified: Secondary | ICD-10-CM | POA: Diagnosis not present

## 2016-05-24 DIAGNOSIS — I10 Essential (primary) hypertension: Secondary | ICD-10-CM | POA: Diagnosis not present

## 2016-05-24 DIAGNOSIS — J449 Chronic obstructive pulmonary disease, unspecified: Secondary | ICD-10-CM | POA: Diagnosis not present

## 2016-05-24 DIAGNOSIS — Z452 Encounter for adjustment and management of vascular access device: Secondary | ICD-10-CM | POA: Diagnosis not present

## 2016-05-24 DIAGNOSIS — T814XXA Infection following a procedure, initial encounter: Secondary | ICD-10-CM | POA: Diagnosis not present

## 2016-05-24 DIAGNOSIS — R799 Abnormal finding of blood chemistry, unspecified: Secondary | ICD-10-CM | POA: Diagnosis not present

## 2016-05-25 DIAGNOSIS — R7881 Bacteremia: Secondary | ICD-10-CM | POA: Diagnosis not present

## 2016-05-26 DIAGNOSIS — G8929 Other chronic pain: Secondary | ICD-10-CM | POA: Diagnosis not present

## 2016-05-26 DIAGNOSIS — Z452 Encounter for adjustment and management of vascular access device: Secondary | ICD-10-CM | POA: Diagnosis not present

## 2016-05-26 DIAGNOSIS — J449 Chronic obstructive pulmonary disease, unspecified: Secondary | ICD-10-CM | POA: Diagnosis not present

## 2016-05-26 DIAGNOSIS — T814XXA Infection following a procedure, initial encounter: Secondary | ICD-10-CM | POA: Diagnosis not present

## 2016-05-26 DIAGNOSIS — I2510 Atherosclerotic heart disease of native coronary artery without angina pectoris: Secondary | ICD-10-CM | POA: Diagnosis not present

## 2016-05-26 DIAGNOSIS — I4891 Unspecified atrial fibrillation: Secondary | ICD-10-CM | POA: Diagnosis not present

## 2016-05-26 DIAGNOSIS — R7881 Bacteremia: Secondary | ICD-10-CM | POA: Diagnosis not present

## 2016-05-26 DIAGNOSIS — F339 Major depressive disorder, recurrent, unspecified: Secondary | ICD-10-CM | POA: Diagnosis not present

## 2016-05-26 DIAGNOSIS — F419 Anxiety disorder, unspecified: Secondary | ICD-10-CM | POA: Diagnosis not present

## 2016-05-26 DIAGNOSIS — I10 Essential (primary) hypertension: Secondary | ICD-10-CM | POA: Diagnosis not present

## 2016-05-27 DIAGNOSIS — R7881 Bacteremia: Secondary | ICD-10-CM | POA: Diagnosis not present

## 2016-05-28 DIAGNOSIS — G8929 Other chronic pain: Secondary | ICD-10-CM | POA: Diagnosis not present

## 2016-05-28 DIAGNOSIS — F339 Major depressive disorder, recurrent, unspecified: Secondary | ICD-10-CM | POA: Diagnosis not present

## 2016-05-28 DIAGNOSIS — J449 Chronic obstructive pulmonary disease, unspecified: Secondary | ICD-10-CM | POA: Diagnosis not present

## 2016-05-28 DIAGNOSIS — I10 Essential (primary) hypertension: Secondary | ICD-10-CM | POA: Diagnosis not present

## 2016-05-28 DIAGNOSIS — F419 Anxiety disorder, unspecified: Secondary | ICD-10-CM | POA: Diagnosis not present

## 2016-05-28 DIAGNOSIS — Z452 Encounter for adjustment and management of vascular access device: Secondary | ICD-10-CM | POA: Diagnosis not present

## 2016-05-28 DIAGNOSIS — I4891 Unspecified atrial fibrillation: Secondary | ICD-10-CM | POA: Diagnosis not present

## 2016-05-28 DIAGNOSIS — R7881 Bacteremia: Secondary | ICD-10-CM | POA: Diagnosis not present

## 2016-05-28 DIAGNOSIS — I2510 Atherosclerotic heart disease of native coronary artery without angina pectoris: Secondary | ICD-10-CM | POA: Diagnosis not present

## 2016-05-28 DIAGNOSIS — T814XXA Infection following a procedure, initial encounter: Secondary | ICD-10-CM | POA: Diagnosis not present

## 2016-05-29 DIAGNOSIS — R7881 Bacteremia: Secondary | ICD-10-CM | POA: Diagnosis not present

## 2016-05-30 DIAGNOSIS — R7881 Bacteremia: Secondary | ICD-10-CM | POA: Diagnosis not present

## 2016-05-31 DIAGNOSIS — R7881 Bacteremia: Secondary | ICD-10-CM | POA: Diagnosis not present

## 2016-05-31 DIAGNOSIS — I4891 Unspecified atrial fibrillation: Secondary | ICD-10-CM | POA: Diagnosis not present

## 2016-05-31 DIAGNOSIS — G8929 Other chronic pain: Secondary | ICD-10-CM | POA: Diagnosis not present

## 2016-05-31 DIAGNOSIS — J449 Chronic obstructive pulmonary disease, unspecified: Secondary | ICD-10-CM | POA: Diagnosis not present

## 2016-05-31 DIAGNOSIS — I2510 Atherosclerotic heart disease of native coronary artery without angina pectoris: Secondary | ICD-10-CM | POA: Diagnosis not present

## 2016-05-31 DIAGNOSIS — I10 Essential (primary) hypertension: Secondary | ICD-10-CM | POA: Diagnosis not present

## 2016-05-31 DIAGNOSIS — F419 Anxiety disorder, unspecified: Secondary | ICD-10-CM | POA: Diagnosis not present

## 2016-05-31 DIAGNOSIS — T814XXA Infection following a procedure, initial encounter: Secondary | ICD-10-CM | POA: Diagnosis not present

## 2016-05-31 DIAGNOSIS — F339 Major depressive disorder, recurrent, unspecified: Secondary | ICD-10-CM | POA: Diagnosis not present

## 2016-05-31 DIAGNOSIS — Z452 Encounter for adjustment and management of vascular access device: Secondary | ICD-10-CM | POA: Diagnosis not present

## 2016-06-02 DIAGNOSIS — T814XXA Infection following a procedure, initial encounter: Secondary | ICD-10-CM | POA: Diagnosis not present

## 2016-06-02 DIAGNOSIS — I10 Essential (primary) hypertension: Secondary | ICD-10-CM | POA: Diagnosis not present

## 2016-06-02 DIAGNOSIS — F419 Anxiety disorder, unspecified: Secondary | ICD-10-CM | POA: Diagnosis not present

## 2016-06-02 DIAGNOSIS — Z452 Encounter for adjustment and management of vascular access device: Secondary | ICD-10-CM | POA: Diagnosis not present

## 2016-06-02 DIAGNOSIS — F339 Major depressive disorder, recurrent, unspecified: Secondary | ICD-10-CM | POA: Diagnosis not present

## 2016-06-02 DIAGNOSIS — J449 Chronic obstructive pulmonary disease, unspecified: Secondary | ICD-10-CM | POA: Diagnosis not present

## 2016-06-02 DIAGNOSIS — I2510 Atherosclerotic heart disease of native coronary artery without angina pectoris: Secondary | ICD-10-CM | POA: Diagnosis not present

## 2016-06-02 DIAGNOSIS — G8929 Other chronic pain: Secondary | ICD-10-CM | POA: Diagnosis not present

## 2016-06-02 DIAGNOSIS — I4891 Unspecified atrial fibrillation: Secondary | ICD-10-CM | POA: Diagnosis not present

## 2016-06-04 DIAGNOSIS — I2510 Atherosclerotic heart disease of native coronary artery without angina pectoris: Secondary | ICD-10-CM | POA: Diagnosis not present

## 2016-06-04 DIAGNOSIS — F419 Anxiety disorder, unspecified: Secondary | ICD-10-CM | POA: Diagnosis not present

## 2016-06-04 DIAGNOSIS — Z452 Encounter for adjustment and management of vascular access device: Secondary | ICD-10-CM | POA: Diagnosis not present

## 2016-06-04 DIAGNOSIS — J449 Chronic obstructive pulmonary disease, unspecified: Secondary | ICD-10-CM | POA: Diagnosis not present

## 2016-06-04 DIAGNOSIS — I10 Essential (primary) hypertension: Secondary | ICD-10-CM | POA: Diagnosis not present

## 2016-06-04 DIAGNOSIS — G8929 Other chronic pain: Secondary | ICD-10-CM | POA: Diagnosis not present

## 2016-06-04 DIAGNOSIS — F339 Major depressive disorder, recurrent, unspecified: Secondary | ICD-10-CM | POA: Diagnosis not present

## 2016-06-04 DIAGNOSIS — T814XXA Infection following a procedure, initial encounter: Secondary | ICD-10-CM | POA: Diagnosis not present

## 2016-06-04 DIAGNOSIS — I4891 Unspecified atrial fibrillation: Secondary | ICD-10-CM | POA: Diagnosis not present

## 2016-06-05 DIAGNOSIS — T8189XA Other complications of procedures, not elsewhere classified, initial encounter: Secondary | ICD-10-CM | POA: Diagnosis not present

## 2016-06-05 DIAGNOSIS — M6281 Muscle weakness (generalized): Secondary | ICD-10-CM | POA: Diagnosis not present

## 2016-06-05 DIAGNOSIS — L02213 Cutaneous abscess of chest wall: Secondary | ICD-10-CM | POA: Diagnosis not present

## 2016-06-07 DIAGNOSIS — T8189XA Other complications of procedures, not elsewhere classified, initial encounter: Secondary | ICD-10-CM | POA: Diagnosis not present

## 2016-06-07 DIAGNOSIS — I2510 Atherosclerotic heart disease of native coronary artery without angina pectoris: Secondary | ICD-10-CM | POA: Diagnosis not present

## 2016-06-07 DIAGNOSIS — F339 Major depressive disorder, recurrent, unspecified: Secondary | ICD-10-CM | POA: Diagnosis not present

## 2016-06-07 DIAGNOSIS — T814XXA Infection following a procedure, initial encounter: Secondary | ICD-10-CM | POA: Diagnosis not present

## 2016-06-07 DIAGNOSIS — I10 Essential (primary) hypertension: Secondary | ICD-10-CM | POA: Diagnosis not present

## 2016-06-07 DIAGNOSIS — G8929 Other chronic pain: Secondary | ICD-10-CM | POA: Diagnosis not present

## 2016-06-07 DIAGNOSIS — Z452 Encounter for adjustment and management of vascular access device: Secondary | ICD-10-CM | POA: Diagnosis not present

## 2016-06-07 DIAGNOSIS — L02213 Cutaneous abscess of chest wall: Secondary | ICD-10-CM | POA: Diagnosis not present

## 2016-06-07 DIAGNOSIS — F419 Anxiety disorder, unspecified: Secondary | ICD-10-CM | POA: Diagnosis not present

## 2016-06-07 DIAGNOSIS — I4891 Unspecified atrial fibrillation: Secondary | ICD-10-CM | POA: Diagnosis not present

## 2016-06-07 DIAGNOSIS — M6281 Muscle weakness (generalized): Secondary | ICD-10-CM | POA: Diagnosis not present

## 2016-06-07 DIAGNOSIS — J449 Chronic obstructive pulmonary disease, unspecified: Secondary | ICD-10-CM | POA: Diagnosis not present

## 2016-06-09 DIAGNOSIS — I4891 Unspecified atrial fibrillation: Secondary | ICD-10-CM | POA: Diagnosis not present

## 2016-06-09 DIAGNOSIS — I2510 Atherosclerotic heart disease of native coronary artery without angina pectoris: Secondary | ICD-10-CM | POA: Diagnosis not present

## 2016-06-09 DIAGNOSIS — F419 Anxiety disorder, unspecified: Secondary | ICD-10-CM | POA: Diagnosis not present

## 2016-06-09 DIAGNOSIS — G8929 Other chronic pain: Secondary | ICD-10-CM | POA: Diagnosis not present

## 2016-06-09 DIAGNOSIS — T814XXA Infection following a procedure, initial encounter: Secondary | ICD-10-CM | POA: Diagnosis not present

## 2016-06-09 DIAGNOSIS — F339 Major depressive disorder, recurrent, unspecified: Secondary | ICD-10-CM | POA: Diagnosis not present

## 2016-06-09 DIAGNOSIS — J449 Chronic obstructive pulmonary disease, unspecified: Secondary | ICD-10-CM | POA: Diagnosis not present

## 2016-06-09 DIAGNOSIS — Z452 Encounter for adjustment and management of vascular access device: Secondary | ICD-10-CM | POA: Diagnosis not present

## 2016-06-09 DIAGNOSIS — I10 Essential (primary) hypertension: Secondary | ICD-10-CM | POA: Diagnosis not present

## 2016-06-10 DIAGNOSIS — T814XXD Infection following a procedure, subsequent encounter: Secondary | ICD-10-CM | POA: Diagnosis not present

## 2016-06-10 DIAGNOSIS — M545 Low back pain: Secondary | ICD-10-CM | POA: Diagnosis not present

## 2016-06-10 DIAGNOSIS — G8929 Other chronic pain: Secondary | ICD-10-CM | POA: Diagnosis not present

## 2016-06-10 DIAGNOSIS — Z23 Encounter for immunization: Secondary | ICD-10-CM | POA: Diagnosis not present

## 2016-06-11 DIAGNOSIS — I4891 Unspecified atrial fibrillation: Secondary | ICD-10-CM | POA: Diagnosis not present

## 2016-06-11 DIAGNOSIS — I10 Essential (primary) hypertension: Secondary | ICD-10-CM | POA: Diagnosis not present

## 2016-06-11 DIAGNOSIS — F419 Anxiety disorder, unspecified: Secondary | ICD-10-CM | POA: Diagnosis not present

## 2016-06-11 DIAGNOSIS — F339 Major depressive disorder, recurrent, unspecified: Secondary | ICD-10-CM | POA: Diagnosis not present

## 2016-06-11 DIAGNOSIS — G8929 Other chronic pain: Secondary | ICD-10-CM | POA: Diagnosis not present

## 2016-06-11 DIAGNOSIS — J449 Chronic obstructive pulmonary disease, unspecified: Secondary | ICD-10-CM | POA: Diagnosis not present

## 2016-06-11 DIAGNOSIS — T814XXA Infection following a procedure, initial encounter: Secondary | ICD-10-CM | POA: Diagnosis not present

## 2016-06-11 DIAGNOSIS — Z452 Encounter for adjustment and management of vascular access device: Secondary | ICD-10-CM | POA: Diagnosis not present

## 2016-06-11 DIAGNOSIS — I2510 Atherosclerotic heart disease of native coronary artery without angina pectoris: Secondary | ICD-10-CM | POA: Diagnosis not present

## 2016-06-14 DIAGNOSIS — F339 Major depressive disorder, recurrent, unspecified: Secondary | ICD-10-CM | POA: Diagnosis not present

## 2016-06-14 DIAGNOSIS — J449 Chronic obstructive pulmonary disease, unspecified: Secondary | ICD-10-CM | POA: Diagnosis not present

## 2016-06-14 DIAGNOSIS — Z452 Encounter for adjustment and management of vascular access device: Secondary | ICD-10-CM | POA: Diagnosis not present

## 2016-06-14 DIAGNOSIS — I2510 Atherosclerotic heart disease of native coronary artery without angina pectoris: Secondary | ICD-10-CM | POA: Diagnosis not present

## 2016-06-14 DIAGNOSIS — I4891 Unspecified atrial fibrillation: Secondary | ICD-10-CM | POA: Diagnosis not present

## 2016-06-14 DIAGNOSIS — I10 Essential (primary) hypertension: Secondary | ICD-10-CM | POA: Diagnosis not present

## 2016-06-14 DIAGNOSIS — T814XXA Infection following a procedure, initial encounter: Secondary | ICD-10-CM | POA: Diagnosis not present

## 2016-06-14 DIAGNOSIS — G8929 Other chronic pain: Secondary | ICD-10-CM | POA: Diagnosis not present

## 2016-06-14 DIAGNOSIS — F419 Anxiety disorder, unspecified: Secondary | ICD-10-CM | POA: Diagnosis not present

## 2016-06-16 DIAGNOSIS — I4891 Unspecified atrial fibrillation: Secondary | ICD-10-CM | POA: Diagnosis not present

## 2016-06-16 DIAGNOSIS — I10 Essential (primary) hypertension: Secondary | ICD-10-CM | POA: Diagnosis not present

## 2016-06-16 DIAGNOSIS — J449 Chronic obstructive pulmonary disease, unspecified: Secondary | ICD-10-CM | POA: Diagnosis not present

## 2016-06-16 DIAGNOSIS — F339 Major depressive disorder, recurrent, unspecified: Secondary | ICD-10-CM | POA: Diagnosis not present

## 2016-06-16 DIAGNOSIS — Z452 Encounter for adjustment and management of vascular access device: Secondary | ICD-10-CM | POA: Diagnosis not present

## 2016-06-16 DIAGNOSIS — G8929 Other chronic pain: Secondary | ICD-10-CM | POA: Diagnosis not present

## 2016-06-16 DIAGNOSIS — T814XXA Infection following a procedure, initial encounter: Secondary | ICD-10-CM | POA: Diagnosis not present

## 2016-06-16 DIAGNOSIS — F419 Anxiety disorder, unspecified: Secondary | ICD-10-CM | POA: Diagnosis not present

## 2016-06-16 DIAGNOSIS — I2510 Atherosclerotic heart disease of native coronary artery without angina pectoris: Secondary | ICD-10-CM | POA: Diagnosis not present

## 2016-06-18 ENCOUNTER — Other Ambulatory Visit: Payer: Self-pay

## 2016-06-18 DIAGNOSIS — F419 Anxiety disorder, unspecified: Secondary | ICD-10-CM | POA: Diagnosis not present

## 2016-06-18 DIAGNOSIS — R42 Dizziness and giddiness: Secondary | ICD-10-CM | POA: Diagnosis not present

## 2016-06-18 DIAGNOSIS — T8189XD Other complications of procedures, not elsewhere classified, subsequent encounter: Secondary | ICD-10-CM | POA: Diagnosis not present

## 2016-06-18 DIAGNOSIS — R001 Bradycardia, unspecified: Secondary | ICD-10-CM | POA: Diagnosis not present

## 2016-06-18 DIAGNOSIS — I2510 Atherosclerotic heart disease of native coronary artery without angina pectoris: Secondary | ICD-10-CM | POA: Diagnosis not present

## 2016-06-18 DIAGNOSIS — R079 Chest pain, unspecified: Secondary | ICD-10-CM | POA: Diagnosis not present

## 2016-06-18 DIAGNOSIS — F339 Major depressive disorder, recurrent, unspecified: Secondary | ICD-10-CM | POA: Diagnosis not present

## 2016-06-18 DIAGNOSIS — Z452 Encounter for adjustment and management of vascular access device: Secondary | ICD-10-CM | POA: Diagnosis not present

## 2016-06-18 DIAGNOSIS — R110 Nausea: Secondary | ICD-10-CM | POA: Diagnosis not present

## 2016-06-18 DIAGNOSIS — I4891 Unspecified atrial fibrillation: Secondary | ICD-10-CM | POA: Diagnosis not present

## 2016-06-18 DIAGNOSIS — I440 Atrioventricular block, first degree: Secondary | ICD-10-CM | POA: Diagnosis not present

## 2016-06-18 DIAGNOSIS — I10 Essential (primary) hypertension: Secondary | ICD-10-CM | POA: Diagnosis not present

## 2016-06-18 DIAGNOSIS — Z5181 Encounter for therapeutic drug level monitoring: Secondary | ICD-10-CM | POA: Diagnosis not present

## 2016-06-18 DIAGNOSIS — J449 Chronic obstructive pulmonary disease, unspecified: Secondary | ICD-10-CM | POA: Diagnosis not present

## 2016-06-18 DIAGNOSIS — Z87891 Personal history of nicotine dependence: Secondary | ICD-10-CM | POA: Diagnosis not present

## 2016-06-18 DIAGNOSIS — R062 Wheezing: Secondary | ICD-10-CM | POA: Diagnosis not present

## 2016-06-18 DIAGNOSIS — T814XXA Infection following a procedure, initial encounter: Secondary | ICD-10-CM | POA: Diagnosis not present

## 2016-06-18 DIAGNOSIS — G8929 Other chronic pain: Secondary | ICD-10-CM | POA: Diagnosis not present

## 2016-06-18 DIAGNOSIS — R0789 Other chest pain: Secondary | ICD-10-CM | POA: Diagnosis not present

## 2016-06-18 NOTE — Patient Outreach (Signed)
[facility]) Care Management  06/18/2016  [name] 01-29-1956 [id number]   Telephonic Screening   Referral Date:  06/18/2016 Source:  [employer]  (72 hour referral request) Issue:  (Info received from [facility])  Member is looking for vision assistance and obtaining glasses.  Consented to referral to CM for vision assistance and management of conditions.  H/o 2 heart attacks and surgeries; currently has tube in chest, headaches, takes prostate medication.  Denies Diabetes; Diabetes noted in claims.  Lost wife last year and feeling down and stressed due to health.  Recent Admission / discharge date:  04/23/16.  Addressed alerts.  Verified phone number as [phone].    InsurancePecola Lawless [facility] [id number]  MR Review: Co-morbidities:  COPD, CHF, Essential HTN, CAD, Hyperlipidemia, GERD, Chronic back pain, Arthritis, Anxiety, Depression, Obesity, NSTEMI, triple bypass (09/2015 at [facility]). H/o alcohol and tobacco use and urine drug screen positive for cocaine 02/2016.   Admissions:  02/25/2016 -  02/26/2016  chest pain, syncope, SOB, LE edema  Outreach call #1 to patient. Patient not reached.  RN CM left HIPAA compliant voice message with name and number.   RN CM scheduled for next outreach call within one week.  [name], BSN, RN, CCM  [facility] [employer] [facility] Care Management Coordinator [phone] Direct [phone] Cell [phone] Office [phone] Fax [name]

## 2016-06-19 DIAGNOSIS — M6281 Muscle weakness (generalized): Secondary | ICD-10-CM | POA: Diagnosis not present

## 2016-06-19 DIAGNOSIS — L02213 Cutaneous abscess of chest wall: Secondary | ICD-10-CM | POA: Diagnosis not present

## 2016-06-19 DIAGNOSIS — T8189XA Other complications of procedures, not elsewhere classified, initial encounter: Secondary | ICD-10-CM | POA: Diagnosis not present

## 2016-06-21 ENCOUNTER — Other Ambulatory Visit: Payer: Self-pay

## 2016-06-21 NOTE — Patient Outreach (Addendum)
[facility]) Care Management  06/21/2016  [name] Mar 16, 1956 [id number]   Telephonic Screening    Referral Date:  06/18/2016 Source:  [employer]  (72 hour referral request) Issue:  (Info received from [facility])  Member is looking for vision assistance and obtaining glasses.  Consented to referral to CM for vision assistance and management of conditions.  H/o 2 heart attacks and surgeries; currently has tube in chest, headaches, takes prostate medication.  Denies Diabetes; Diabetes noted in claims.  Lost wife last year and feeling down and stressed due to health.  Recent Admission / discharge date:  04/23/16.  Addressed alerts.  Verified phone number as [phone].    InsurancePecola Lawless:  [employer] [facility] [id number]  Subjective:   Outreach call #2 to patient. Patient reached.  Screening and Initial Assessment completed.    Providers: Primary MD:  Dr. Annette StableXaje A. [name]  - last appt 12/07/2012 Thoracic Surgeon:  Dr. Tamala SerJohn Carroll [place] [facility] [phone] HH:  RN 3 times a week (Patient unable to provide name of provider).  Psycho/Social: Patient prefers to be call "[name]". Request to add "Senior" to name as son has same name:  [name], Sr.)  Current physical / mailing address: [address]  [place], [place]  [id number] Patient is a widower and lived alone [place] home until over the past year since requiring so many cardiac surgeries and needing 24/7 supervised care and assistance.  Patient is now temporarily residing with [place] in [place], [place] for care and to remain close [facility].   Patient states his wife passed suddenly 04/15/15 from MI and Stroke at the age of 60 yo.    Mobility: States ambulating without any issues.  Falls: several falls last year but none over the past 2 months.  Past falls associated to dizziness and weakness.  Pain: hip pain but managed with daily pain med Depression: Grieving over loss of his wife; on  antidepressant and states well managed.  Transportation: Benedetto GoadUber or family Caregiver: Son  Emergency Contact: Son Advance Directive:  none Consent:  Yes [facility] Resources: SSD DME: Chest Tube, Wound VAC.  ([facility])   Co-morbidities:  COPD, CHF, Essential HTN, CAD, Hyperlipidemia, GERD, Chronic back pain, Arthritis, Anxiety, Depression, Obesity, NSTEMI, [facility] ([facility] H/o alcohol and tobacco use and urine drug screen positive for cocaine 02/2016.   Admissions:   04/23/16 ([facility])  [facility] 02/25/2016 - 02/26/2016  chest pain, syncope, SOB, LE edema ([place]) 09/28/15 (first surgery date) and 8 surgeries since that time.  Last surgery date 03/2016.   Lipid Panel completed 07/13/2015 HDL 57.000 07/13/2015 LDL 44.01085.000 07/13/2015 Cholesterol, total 151.000 07/13/2015 Triglycerides 45.000 07/13/2015 A1C 5.700 07/13/2015 Glucose Random 108.000 02/26/2016 BP 100/58 02/25/2016 Weight 264 lb (120 kg) 02/25/2016  (patient states his weight is down 139 lbs from a size 46 to a size 38).  Height 75 in (191 cm) 02/25/2016 BMI 33.10 (Obese Class I) 02/25/2016  Medications:  Patient taking 15 medications  Co-pay cost issues: none Prevnar (PCV13) N/D Pneumovax (PPS N/D Flu Vaccine 05/29/2008 tDAP Vaccine N/D Medication Reconciliation completed with patient 06/21/16  Encounter Medications:  Outpatient Encounter Prescriptions as of 06/21/2016  Medication Sig Note  . ALPRAZolam (XANAX) 1 MG tablet Take 1 tablet (1 mg total) by mouth 3 (three) times daily as needed for anxiety. For anxiety (Patient taking differently:  Take 1 mg by mouth 3 (three) times daily. For anxiety)   . amiodarone (PACERONE) 200 MG tablet Take 1 tablet by mouth daily.   Marland Kitchen amLODipine (NORVASC) 10 MG tablet Take 10 mg by mouth daily.   Marland Kitchen aspirin 81 MG tablet Take 1 tablet (81 mg total) by mouth daily.   Marland Kitchen atorvastatin (LIPITOR) 10 MG  tablet TAKE (1) TABLET BY MOUTH ONCE DAILY.   . budesonide-formoterol (SYMBICORT) 160-4.5 MCG/ACT inhaler Inhale 2 puffs into the lungs 2 (two) times daily.   . citalopram (CELEXA) 10 MG tablet Take 10 mg by mouth every morning.   Marland Kitchen doxepin (SINEQUAN) 25 MG capsule Take 25 mg by mouth at bedtime.  02/25/2016: Patient needs a refill.  . furosemide (LASIX) 40 MG tablet Take 40 mg by mouth every morning.   . hydrochlorothiazide (HYDRODIURIL) 25 MG tablet Take 25 mg by mouth every morning.  09/18/2014: .  . ipratropium-albuterol (DUONEB) 0.5-2.5 (3) MG/3ML SOLN Take 3 mLs by nebulization every 6 (six) hours as needed (shortness of breath/ wheezing.).    Marland Kitchen isosorbide mononitrate (IMDUR) 30 MG 24 hr tablet Take 1 tablet (30 mg total) by mouth 2 (two) times daily. (Patient taking differently: Take 30 mg by mouth every morning. )   . meloxicam (MOBIC) 15 MG tablet Take 15 mg by mouth every morning.    . metoprolol tartrate (LOPRESSOR) 25 MG tablet Take 1 tablet by mouth daily.   . nitroGLYCERIN (NITROSTAT) 0.4 MG SL tablet Place 0.4 mg under the tongue every 5 (five) minutes as needed. For chest pain 02/25/2016: 2 doses today.  Marland Kitchen omeprazole (PRILOSEC) 20 MG capsule Take 20 mg by mouth every morning.    . ondansetron (ZOFRAN) 4 MG tablet Take 1 tablet by mouth 2 (two) times daily as needed.   Marland Kitchen oxyCODONE-acetaminophen (PERCOCET) 10-325 MG per tablet Take 1 tablet by mouth 3 (three) times daily.    . potassium chloride SA (K-DUR,KLOR-CON) 20 MEQ tablet Take 20 mEq by mouth every morning.    Marland Kitchen PROAIR HFA 108 (90 BASE) MCG/ACT inhaler Inhale 2 puffs into the lungs daily as needed for wheezing or shortness of breath.    . ranolazine (RANEXA) 500 MG 12 hr tablet Take 1 tablet (500 mg total) by mouth 2 (two) times daily.   . tamsulosin (FLOMAX) 0.4 MG CAPS capsule Take 1 capsule by mouth daily.   Marland Kitchen tiotropium (SPIRIVA) 18 MCG inhalation capsule Place 18 mcg into inhaler and inhale daily.   Marland Kitchen VITAMIN E PO Take 1  tablet by mouth daily.   . VOLTAREN 1 % GEL Apply 2 g topically daily as needed (pain).  07/11/2015: Uses on back and hips as needed   No facility-administered encounter medications on file as of 06/21/2016.     Functional Status:  In your present state of health, do you have any difficulty performing the following activities: 06/21/2016 02/25/2016  Hearing? N N  Vision? Y N  Difficulty concentrating or making decisions? N N  Walking or climbing stairs? N Y  Dressing or bathing? N N  Doing errands, shopping? Malvin Johns  Preparing Food and eating ? N -  Using the Toilet? N -  In the past six months, have you accidently leaked urine? N -  Do you have problems with loss of bowel control? N -  Managing your Medications? N -  Managing your Finances? N -  Some recent data might be hidden    Fall/Depression Screening: PHQ 2/9 Scores 06/21/2016  PHQ - 2 Score 0   Fall Risk  06/21/2016  Falls in the past year? Yes  Number falls in past yr: 2 or more  Injury with Fall? No  Risk Factor Category  High Fall Risk  Risk for fall due to : History of fall(s);Impaired vision  Follow up Falls evaluation completed;Education provided;Falls prevention discussed    Preventives: Hearing: never screened / no issues Eyes:  (Glasses lost via [facility]).  Patient states he has made numerous calls to the facility but no one will call him back.  Patient states he is currently has no prescription glasses and only has reading glasses.  States he needs his glasses and seeing has been very difficult as readers do not help with distant needs such as watching TV.  Patient states his last glasses cost him $480.00 and had only had them for two months.  States his eye MD office advised that patient will need a new eye exam due to last exam over one year ago.  Patient states he can go to a local [employer] in [place] to get an exam and new glasses but needs assistance with the cost of the glasses.     Dentist:  None:   Dentures: upper and lower Colonoscopy:  (unknown) Smoker:  Quit 09/15/15  Assessment: RAF Score:  2.566 Patient currently requires 24/7 supervised care and assistance.  H/o multiple admissions and rehab admission over the past 10 months.  -High Risk for medication error due to vision deficit (no glasses).   Plan:  Referral Date:  06/18/2016 Screening and Initial Assessment:  06/21/16 Telephonic RN CM date:  06/21/16 Program:  CHF 06/21/16  CHF RN CM discussed importance of daily weights.  [facility] Educational Materials (mailed 06/21/16) -Heart Failure:  Understanding Water Pills and Thirst -Heart Failure:  Keeping Track Of Your Weight Each Day -Heart Failure:  How To Be Salt Smart -Heart Failure:  When To Call Your Doctor Or 911 -Heart Zones Magnet   Advance Directives -RN CM will continue to provide supportive education and assistance as needed.  -Advance Directive Document (mailed 06/21/16)  [facility] Social Work Referral (06/21/16) -Resources for eyeglasses.   RN CM advised in next [facility] scheduled contact call within next 30 days for monthly assessment and care coordination services as needed. RN CM advised to please notify MD of any changes in condition prior to scheduled appt's.   RN CM provided contact name and # [phone] or main office # [phone] and 24-hour nurse line # [phone].  RN CM confirmed patient is aware of 911 services for urgent emergency needs.  RN CM sent successful outreach letter and  [facility] Introductory package. RN CM sent Physician Enrollment/Barriers Letter and Initial Assessment to Primary MD RN CM notified [facility] Care Management Assistant: agreed to services/case opened.  [facility] CM Care Plan Problem One   Flowsheet Row Most Recent Value  Care Plan Problem One  Knowledge deficit relating to CHF.   Role Documenting the Problem One  Care Management Telephonic Coordinator  Care Plan for Problem One  Active  [facility] Long Term Goal (31-90 days)  Patient will  identify all CHF heart zones and symptoms over the next 31-90 days.   [facility] Long Term Goal Start Date  06/21/16  Interventions for Problem One Long Term Goal  RN CM will provide verbal and patient educational materials over the next 31-90 days.   [facility] CM Short Term Goal #1 (0-30 days)  Patient will report red zone symptoms over the  next 30 days.   [facility] CM Short Term Goal #1 Start Date  06/21/16  Interventions for Short Term Goal #1  RN CM will review red zone symptoms and action plan over the next 30 days.     [facility] CM Care Plan Problem Two   Flowsheet Row Most Recent Value  Care Plan Problem Two  Potential for medication error associated to Visual Deficit associated to loss of glasses.   Role Documenting the Problem Two  Care Management Telephonic Coordinator  Care Plan for Problem Two  Active  [facility] CM Short Term Goal #1 (0-30 days)  Patient will engage with [facility] SW for [facility] for eyeglasses over the next 30 days.   [facility] CM Short Term Goal #1 Start Date  06/21/16  Interventions for Short Term Goal #2   RN CM will send SW referral for community resources / glasses over the next 30 days.     [facility] CM Care Plan Problem Three   Flowsheet Row Most Recent Value  Care Plan Problem Three  Advance Directives   Role Documenting the Problem Three  Care Management Telephonic Coordinator  Care Plan for Problem Three  Active  [facility] Long Term Goal (31-90) days  Patient will completed Advance Directive over the next 31-90 days.   [facility] Long Term Goal Start Date  06/21/16  Interventions for Problem Three Long Term Goal  RN CM will provide on-going education and assistance over the next 31-90 days.   [facility] CM Short Term Goal #1 (0-30 days)  Patient will review Advance Directive over the next 30 days.   [facility] CM Short Term Goal #1 Start Date  06/21/16  Interventions for Short Term Goal #1  RN CM will assess for Advance Directives review and questions over the next 30 days.       Simmie Davies, BSN,  RN, CCM  [facility] [employer] [facility] Care Management Coordinator [phone] Direct [phone] Cell [phone] Office [phone] Fax [email or web address]

## 2016-06-22 DIAGNOSIS — F419 Anxiety disorder, unspecified: Secondary | ICD-10-CM | POA: Diagnosis not present

## 2016-06-22 DIAGNOSIS — I2510 Atherosclerotic heart disease of native coronary artery without angina pectoris: Secondary | ICD-10-CM | POA: Diagnosis not present

## 2016-06-22 DIAGNOSIS — T814XXA Infection following a procedure, initial encounter: Secondary | ICD-10-CM | POA: Diagnosis not present

## 2016-06-22 DIAGNOSIS — I10 Essential (primary) hypertension: Secondary | ICD-10-CM | POA: Diagnosis not present

## 2016-06-22 DIAGNOSIS — J449 Chronic obstructive pulmonary disease, unspecified: Secondary | ICD-10-CM | POA: Diagnosis not present

## 2016-06-22 DIAGNOSIS — G8929 Other chronic pain: Secondary | ICD-10-CM | POA: Diagnosis not present

## 2016-06-22 DIAGNOSIS — I4891 Unspecified atrial fibrillation: Secondary | ICD-10-CM | POA: Diagnosis not present

## 2016-06-22 DIAGNOSIS — F339 Major depressive disorder, recurrent, unspecified: Secondary | ICD-10-CM | POA: Diagnosis not present

## 2016-06-22 DIAGNOSIS — Z452 Encounter for adjustment and management of vascular access device: Secondary | ICD-10-CM | POA: Diagnosis not present

## 2016-06-23 ENCOUNTER — Other Ambulatory Visit: Payer: Self-pay | Admitting: *Deleted

## 2016-06-23 ENCOUNTER — Encounter: Payer: Self-pay | Admitting: *Deleted

## 2016-06-23 DIAGNOSIS — Z5189 Encounter for other specified aftercare: Secondary | ICD-10-CM | POA: Diagnosis not present

## 2016-06-23 NOTE — Patient Outreach (Addendum)
[facility]) Care Management  06/23/2016  [name] MATTALIANO Sr. [birth year] [id number]   Phone  Call to patient to provide community resources for eye glasses.  Per patient, his glasses were lost approximately 1 year ago while in was in rehabat the [facility].  Per pateitnt, he has called there several times, even with an attorney and per patient, they will not replace his glasses.  Patient declined having this social worker call stating that he has tried for over a year to get them to replace his glasses and they refuse.  Patient states that he temporarily resides with his son in [place] as most of his doctors are in [place]. Per patient, he will be residing with his son for another 4 to 5 months, maybe longer.  This social worker informed patient that the [employer] in [facility] no longer has the eye exam and glasses assistance program . It was also confirmed, that Medicaid no longer covers eyeglasses and exams for Adults. This social worker contacted the Department of Social [place] [phone] to discuss any possible assistance.  They will assist with the uninsured only.  Patient would have to use his Medicare plan which will require a co-pay of $25.00 for the exam plus the cost of the glasses. Per patient, he does not have the money to replace the glasses that were lost.  The [employer] in his home town of  [place] will assist patient  with an eye exam and glasses.  Patient given the contact number contacted the [facility] in [place]  and has an appointment set up for 07/23/16 to complete the intake application over the phone.[name] [phone]).  He was also told that he would have the eye exam completed on that same day due to the fact that he would be traveling from [place]. Patient's son agrees to provide the transportation to [facility] for the eye exam.  Patient verbalized having no additional social work needs and states  that he will contact this Child psychotherapist if any needs arise in the future. Patient's case to be closed at this time to social work.   [name] [facility] Care Management [phone]

## 2016-06-24 DIAGNOSIS — Z452 Encounter for adjustment and management of vascular access device: Secondary | ICD-10-CM | POA: Diagnosis not present

## 2016-06-24 DIAGNOSIS — F339 Major depressive disorder, recurrent, unspecified: Secondary | ICD-10-CM | POA: Diagnosis not present

## 2016-06-24 DIAGNOSIS — G8929 Other chronic pain: Secondary | ICD-10-CM | POA: Diagnosis not present

## 2016-06-24 DIAGNOSIS — I2510 Atherosclerotic heart disease of native coronary artery without angina pectoris: Secondary | ICD-10-CM | POA: Diagnosis not present

## 2016-06-24 DIAGNOSIS — F419 Anxiety disorder, unspecified: Secondary | ICD-10-CM | POA: Diagnosis not present

## 2016-06-24 DIAGNOSIS — I10 Essential (primary) hypertension: Secondary | ICD-10-CM | POA: Diagnosis not present

## 2016-06-24 DIAGNOSIS — T814XXA Infection following a procedure, initial encounter: Secondary | ICD-10-CM | POA: Diagnosis not present

## 2016-06-24 DIAGNOSIS — I4891 Unspecified atrial fibrillation: Secondary | ICD-10-CM | POA: Diagnosis not present

## 2016-06-24 DIAGNOSIS — J449 Chronic obstructive pulmonary disease, unspecified: Secondary | ICD-10-CM | POA: Diagnosis not present

## 2016-06-25 DIAGNOSIS — T8189XA Other complications of procedures, not elsewhere classified, initial encounter: Secondary | ICD-10-CM | POA: Diagnosis not present

## 2016-06-28 DIAGNOSIS — F419 Anxiety disorder, unspecified: Secondary | ICD-10-CM | POA: Diagnosis not present

## 2016-06-28 DIAGNOSIS — Z452 Encounter for adjustment and management of vascular access device: Secondary | ICD-10-CM | POA: Diagnosis not present

## 2016-06-28 DIAGNOSIS — J449 Chronic obstructive pulmonary disease, unspecified: Secondary | ICD-10-CM | POA: Diagnosis not present

## 2016-06-28 DIAGNOSIS — I2510 Atherosclerotic heart disease of native coronary artery without angina pectoris: Secondary | ICD-10-CM | POA: Diagnosis not present

## 2016-06-28 DIAGNOSIS — T814XXA Infection following a procedure, initial encounter: Secondary | ICD-10-CM | POA: Diagnosis not present

## 2016-06-28 DIAGNOSIS — G8929 Other chronic pain: Secondary | ICD-10-CM | POA: Diagnosis not present

## 2016-06-28 DIAGNOSIS — F339 Major depressive disorder, recurrent, unspecified: Secondary | ICD-10-CM | POA: Diagnosis not present

## 2016-06-28 DIAGNOSIS — I4891 Unspecified atrial fibrillation: Secondary | ICD-10-CM | POA: Diagnosis not present

## 2016-06-28 DIAGNOSIS — I10 Essential (primary) hypertension: Secondary | ICD-10-CM | POA: Diagnosis not present

## 2016-06-30 DIAGNOSIS — R262 Difficulty in walking, not elsewhere classified: Secondary | ICD-10-CM | POA: Diagnosis not present

## 2016-06-30 DIAGNOSIS — S21109D Unspecified open wound of unspecified front wall of thorax without penetration into thoracic cavity, subsequent encounter: Secondary | ICD-10-CM | POA: Diagnosis not present

## 2016-06-30 DIAGNOSIS — T8130XA Disruption of wound, unspecified, initial encounter: Secondary | ICD-10-CM | POA: Diagnosis not present

## 2016-06-30 DIAGNOSIS — R918 Other nonspecific abnormal finding of lung field: Secondary | ICD-10-CM | POA: Diagnosis not present

## 2016-06-30 DIAGNOSIS — Q245 Malformation of coronary vessels: Secondary | ICD-10-CM | POA: Diagnosis not present

## 2016-06-30 DIAGNOSIS — Z951 Presence of aortocoronary bypass graft: Secondary | ICD-10-CM | POA: Diagnosis not present

## 2016-06-30 DIAGNOSIS — I25118 Atherosclerotic heart disease of native coronary artery with other forms of angina pectoris: Secondary | ICD-10-CM | POA: Diagnosis not present

## 2016-06-30 DIAGNOSIS — R079 Chest pain, unspecified: Secondary | ICD-10-CM | POA: Diagnosis not present

## 2016-06-30 DIAGNOSIS — M19012 Primary osteoarthritis, left shoulder: Secondary | ICD-10-CM | POA: Diagnosis not present

## 2016-06-30 DIAGNOSIS — I200 Unstable angina: Secondary | ICD-10-CM | POA: Diagnosis not present

## 2016-06-30 DIAGNOSIS — I110 Hypertensive heart disease with heart failure: Secondary | ICD-10-CM | POA: Diagnosis not present

## 2016-06-30 DIAGNOSIS — R0602 Shortness of breath: Secondary | ICD-10-CM | POA: Diagnosis not present

## 2016-06-30 DIAGNOSIS — Y33XXXD Other specified events, undetermined intent, subsequent encounter: Secondary | ICD-10-CM | POA: Diagnosis not present

## 2016-06-30 DIAGNOSIS — M25512 Pain in left shoulder: Secondary | ICD-10-CM | POA: Diagnosis not present

## 2016-06-30 DIAGNOSIS — J449 Chronic obstructive pulmonary disease, unspecified: Secondary | ICD-10-CM | POA: Diagnosis not present

## 2016-06-30 DIAGNOSIS — E785 Hyperlipidemia, unspecified: Secondary | ICD-10-CM | POA: Diagnosis not present

## 2016-06-30 DIAGNOSIS — F1110 Opioid abuse, uncomplicated: Secondary | ICD-10-CM | POA: Diagnosis not present

## 2016-06-30 DIAGNOSIS — R0789 Other chest pain: Secondary | ICD-10-CM | POA: Diagnosis not present

## 2016-06-30 DIAGNOSIS — Z9889 Other specified postprocedural states: Secondary | ICD-10-CM | POA: Diagnosis not present

## 2016-06-30 DIAGNOSIS — I25110 Atherosclerotic heart disease of native coronary artery with unstable angina pectoris: Secondary | ICD-10-CM | POA: Diagnosis not present

## 2016-06-30 DIAGNOSIS — G4733 Obstructive sleep apnea (adult) (pediatric): Secondary | ICD-10-CM | POA: Diagnosis not present

## 2016-06-30 DIAGNOSIS — F339 Major depressive disorder, recurrent, unspecified: Secondary | ICD-10-CM | POA: Diagnosis not present

## 2016-06-30 DIAGNOSIS — I5033 Acute on chronic diastolic (congestive) heart failure: Secondary | ICD-10-CM | POA: Diagnosis not present

## 2016-06-30 DIAGNOSIS — B9562 Methicillin resistant Staphylococcus aureus infection as the cause of diseases classified elsewhere: Secondary | ICD-10-CM | POA: Diagnosis not present

## 2016-06-30 DIAGNOSIS — I10 Essential (primary) hypertension: Secondary | ICD-10-CM | POA: Diagnosis not present

## 2016-06-30 DIAGNOSIS — E669 Obesity, unspecified: Secondary | ICD-10-CM | POA: Diagnosis not present

## 2016-07-06 DIAGNOSIS — I10 Essential (primary) hypertension: Secondary | ICD-10-CM | POA: Diagnosis not present

## 2016-07-06 DIAGNOSIS — G8929 Other chronic pain: Secondary | ICD-10-CM | POA: Diagnosis not present

## 2016-07-06 DIAGNOSIS — I4891 Unspecified atrial fibrillation: Secondary | ICD-10-CM | POA: Diagnosis not present

## 2016-07-06 DIAGNOSIS — T814XXA Infection following a procedure, initial encounter: Secondary | ICD-10-CM | POA: Diagnosis not present

## 2016-07-06 DIAGNOSIS — J449 Chronic obstructive pulmonary disease, unspecified: Secondary | ICD-10-CM | POA: Diagnosis not present

## 2016-07-06 DIAGNOSIS — I2510 Atherosclerotic heart disease of native coronary artery without angina pectoris: Secondary | ICD-10-CM | POA: Diagnosis not present

## 2016-07-06 DIAGNOSIS — Z452 Encounter for adjustment and management of vascular access device: Secondary | ICD-10-CM | POA: Diagnosis not present

## 2016-07-06 DIAGNOSIS — F339 Major depressive disorder, recurrent, unspecified: Secondary | ICD-10-CM | POA: Diagnosis not present

## 2016-07-06 DIAGNOSIS — F419 Anxiety disorder, unspecified: Secondary | ICD-10-CM | POA: Diagnosis not present

## 2016-07-08 DIAGNOSIS — Z4889 Encounter for other specified surgical aftercare: Secondary | ICD-10-CM | POA: Diagnosis not present

## 2016-07-09 DIAGNOSIS — Z452 Encounter for adjustment and management of vascular access device: Secondary | ICD-10-CM | POA: Diagnosis not present

## 2016-07-09 DIAGNOSIS — G8929 Other chronic pain: Secondary | ICD-10-CM | POA: Diagnosis not present

## 2016-07-09 DIAGNOSIS — I2510 Atherosclerotic heart disease of native coronary artery without angina pectoris: Secondary | ICD-10-CM | POA: Diagnosis not present

## 2016-07-09 DIAGNOSIS — I4891 Unspecified atrial fibrillation: Secondary | ICD-10-CM | POA: Diagnosis not present

## 2016-07-09 DIAGNOSIS — J449 Chronic obstructive pulmonary disease, unspecified: Secondary | ICD-10-CM | POA: Diagnosis not present

## 2016-07-09 DIAGNOSIS — I10 Essential (primary) hypertension: Secondary | ICD-10-CM | POA: Diagnosis not present

## 2016-07-09 DIAGNOSIS — F419 Anxiety disorder, unspecified: Secondary | ICD-10-CM | POA: Diagnosis not present

## 2016-07-09 DIAGNOSIS — T814XXA Infection following a procedure, initial encounter: Secondary | ICD-10-CM | POA: Diagnosis not present

## 2016-07-09 DIAGNOSIS — F339 Major depressive disorder, recurrent, unspecified: Secondary | ICD-10-CM | POA: Diagnosis not present

## 2016-07-13 DIAGNOSIS — F419 Anxiety disorder, unspecified: Secondary | ICD-10-CM | POA: Diagnosis not present

## 2016-07-13 DIAGNOSIS — Z452 Encounter for adjustment and management of vascular access device: Secondary | ICD-10-CM | POA: Diagnosis not present

## 2016-07-13 DIAGNOSIS — G8929 Other chronic pain: Secondary | ICD-10-CM | POA: Diagnosis not present

## 2016-07-13 DIAGNOSIS — I2510 Atherosclerotic heart disease of native coronary artery without angina pectoris: Secondary | ICD-10-CM | POA: Diagnosis not present

## 2016-07-13 DIAGNOSIS — T814XXA Infection following a procedure, initial encounter: Secondary | ICD-10-CM | POA: Diagnosis not present

## 2016-07-13 DIAGNOSIS — I10 Essential (primary) hypertension: Secondary | ICD-10-CM | POA: Diagnosis not present

## 2016-07-13 DIAGNOSIS — J449 Chronic obstructive pulmonary disease, unspecified: Secondary | ICD-10-CM | POA: Diagnosis not present

## 2016-07-13 DIAGNOSIS — F339 Major depressive disorder, recurrent, unspecified: Secondary | ICD-10-CM | POA: Diagnosis not present

## 2016-07-13 DIAGNOSIS — I4891 Unspecified atrial fibrillation: Secondary | ICD-10-CM | POA: Diagnosis not present

## 2016-07-14 DIAGNOSIS — Z87891 Personal history of nicotine dependence: Secondary | ICD-10-CM | POA: Diagnosis not present

## 2016-07-14 DIAGNOSIS — R079 Chest pain, unspecified: Secondary | ICD-10-CM | POA: Diagnosis not present

## 2016-07-14 DIAGNOSIS — R42 Dizziness and giddiness: Secondary | ICD-10-CM | POA: Diagnosis not present

## 2016-07-14 DIAGNOSIS — M791 Myalgia: Secondary | ICD-10-CM | POA: Diagnosis not present

## 2016-07-14 DIAGNOSIS — J069 Acute upper respiratory infection, unspecified: Secondary | ICD-10-CM | POA: Diagnosis not present

## 2016-07-14 DIAGNOSIS — R05 Cough: Secondary | ICD-10-CM | POA: Diagnosis not present

## 2016-07-14 DIAGNOSIS — Z951 Presence of aortocoronary bypass graft: Secondary | ICD-10-CM | POA: Diagnosis not present

## 2016-07-14 DIAGNOSIS — R509 Fever, unspecified: Secondary | ICD-10-CM | POA: Diagnosis not present

## 2016-07-14 DIAGNOSIS — R110 Nausea: Secondary | ICD-10-CM | POA: Diagnosis not present

## 2016-07-14 DIAGNOSIS — I440 Atrioventricular block, first degree: Secondary | ICD-10-CM | POA: Diagnosis not present

## 2016-07-14 DIAGNOSIS — R062 Wheezing: Secondary | ICD-10-CM | POA: Diagnosis not present

## 2016-07-16 DIAGNOSIS — Z452 Encounter for adjustment and management of vascular access device: Secondary | ICD-10-CM | POA: Diagnosis not present

## 2016-07-16 DIAGNOSIS — F339 Major depressive disorder, recurrent, unspecified: Secondary | ICD-10-CM | POA: Diagnosis not present

## 2016-07-16 DIAGNOSIS — J449 Chronic obstructive pulmonary disease, unspecified: Secondary | ICD-10-CM | POA: Diagnosis not present

## 2016-07-16 DIAGNOSIS — I10 Essential (primary) hypertension: Secondary | ICD-10-CM | POA: Diagnosis not present

## 2016-07-16 DIAGNOSIS — T814XXA Infection following a procedure, initial encounter: Secondary | ICD-10-CM | POA: Diagnosis not present

## 2016-07-16 DIAGNOSIS — F419 Anxiety disorder, unspecified: Secondary | ICD-10-CM | POA: Diagnosis not present

## 2016-07-16 DIAGNOSIS — I2510 Atherosclerotic heart disease of native coronary artery without angina pectoris: Secondary | ICD-10-CM | POA: Diagnosis not present

## 2016-07-16 DIAGNOSIS — I4891 Unspecified atrial fibrillation: Secondary | ICD-10-CM | POA: Diagnosis not present

## 2016-07-16 DIAGNOSIS — G8929 Other chronic pain: Secondary | ICD-10-CM | POA: Diagnosis not present

## 2016-07-19 ENCOUNTER — Ambulatory Visit: Payer: Self-pay

## 2016-07-21 ENCOUNTER — Ambulatory Visit: Payer: Self-pay

## 2016-07-21 DIAGNOSIS — F419 Anxiety disorder, unspecified: Secondary | ICD-10-CM | POA: Diagnosis not present

## 2016-07-21 DIAGNOSIS — G8929 Other chronic pain: Secondary | ICD-10-CM | POA: Diagnosis not present

## 2016-07-21 DIAGNOSIS — I110 Hypertensive heart disease with heart failure: Secondary | ICD-10-CM | POA: Diagnosis not present

## 2016-07-21 DIAGNOSIS — I5032 Chronic diastolic (congestive) heart failure: Secondary | ICD-10-CM | POA: Diagnosis not present

## 2016-07-21 DIAGNOSIS — I4891 Unspecified atrial fibrillation: Secondary | ICD-10-CM | POA: Diagnosis not present

## 2016-07-21 DIAGNOSIS — T8189XA Other complications of procedures, not elsewhere classified, initial encounter: Secondary | ICD-10-CM | POA: Diagnosis not present

## 2016-07-21 DIAGNOSIS — I2510 Atherosclerotic heart disease of native coronary artery without angina pectoris: Secondary | ICD-10-CM | POA: Diagnosis not present

## 2016-07-21 DIAGNOSIS — J449 Chronic obstructive pulmonary disease, unspecified: Secondary | ICD-10-CM | POA: Diagnosis not present

## 2016-07-21 DIAGNOSIS — F339 Major depressive disorder, recurrent, unspecified: Secondary | ICD-10-CM | POA: Diagnosis not present

## 2016-07-21 DIAGNOSIS — T814XXA Infection following a procedure, initial encounter: Secondary | ICD-10-CM | POA: Diagnosis not present

## 2016-07-22 NOTE — Telephone Encounter (Signed)
This encounter was created in error - please disregard.

## 2016-07-23 ENCOUNTER — Ambulatory Visit: Payer: Self-pay

## 2016-07-26 ENCOUNTER — Ambulatory Visit: Payer: Self-pay

## 2016-07-27 ENCOUNTER — Ambulatory Visit: Payer: Self-pay

## 2016-07-28 DIAGNOSIS — F419 Anxiety disorder, unspecified: Secondary | ICD-10-CM | POA: Diagnosis not present

## 2016-07-28 DIAGNOSIS — I110 Hypertensive heart disease with heart failure: Secondary | ICD-10-CM | POA: Diagnosis not present

## 2016-07-28 DIAGNOSIS — I4891 Unspecified atrial fibrillation: Secondary | ICD-10-CM | POA: Diagnosis not present

## 2016-07-28 DIAGNOSIS — T814XXA Infection following a procedure, initial encounter: Secondary | ICD-10-CM | POA: Diagnosis not present

## 2016-07-28 DIAGNOSIS — G8929 Other chronic pain: Secondary | ICD-10-CM | POA: Diagnosis not present

## 2016-07-28 DIAGNOSIS — I2510 Atherosclerotic heart disease of native coronary artery without angina pectoris: Secondary | ICD-10-CM | POA: Diagnosis not present

## 2016-07-28 DIAGNOSIS — I5032 Chronic diastolic (congestive) heart failure: Secondary | ICD-10-CM | POA: Diagnosis not present

## 2016-07-28 DIAGNOSIS — F339 Major depressive disorder, recurrent, unspecified: Secondary | ICD-10-CM | POA: Diagnosis not present

## 2016-07-28 DIAGNOSIS — J449 Chronic obstructive pulmonary disease, unspecified: Secondary | ICD-10-CM | POA: Diagnosis not present

## 2016-07-29 ENCOUNTER — Ambulatory Visit: Payer: Self-pay

## 2016-07-29 DIAGNOSIS — J449 Chronic obstructive pulmonary disease, unspecified: Secondary | ICD-10-CM | POA: Diagnosis not present

## 2016-07-29 DIAGNOSIS — Z7689 Persons encountering health services in other specified circumstances: Secondary | ICD-10-CM | POA: Diagnosis not present

## 2016-07-29 DIAGNOSIS — I25118 Atherosclerotic heart disease of native coronary artery with other forms of angina pectoris: Secondary | ICD-10-CM | POA: Diagnosis not present

## 2016-07-29 DIAGNOSIS — E7800 Pure hypercholesterolemia, unspecified: Secondary | ICD-10-CM | POA: Diagnosis not present

## 2016-07-29 DIAGNOSIS — F411 Generalized anxiety disorder: Secondary | ICD-10-CM | POA: Diagnosis not present

## 2016-07-29 DIAGNOSIS — I10 Essential (primary) hypertension: Secondary | ICD-10-CM | POA: Diagnosis not present

## 2016-07-30 ENCOUNTER — Ambulatory Visit: Payer: Self-pay

## 2016-07-30 DIAGNOSIS — T814XXD Infection following a procedure, subsequent encounter: Secondary | ICD-10-CM | POA: Diagnosis not present

## 2016-07-30 DIAGNOSIS — M545 Low back pain: Secondary | ICD-10-CM | POA: Diagnosis not present

## 2016-07-30 DIAGNOSIS — M25551 Pain in right hip: Secondary | ICD-10-CM | POA: Diagnosis not present

## 2016-07-30 DIAGNOSIS — G4700 Insomnia, unspecified: Secondary | ICD-10-CM | POA: Diagnosis not present

## 2016-07-30 DIAGNOSIS — J449 Chronic obstructive pulmonary disease, unspecified: Secondary | ICD-10-CM | POA: Diagnosis not present

## 2016-07-30 DIAGNOSIS — Z87898 Personal history of other specified conditions: Secondary | ICD-10-CM | POA: Diagnosis not present

## 2016-07-30 DIAGNOSIS — G4733 Obstructive sleep apnea (adult) (pediatric): Secondary | ICD-10-CM | POA: Diagnosis not present

## 2016-07-30 DIAGNOSIS — G8929 Other chronic pain: Secondary | ICD-10-CM | POA: Diagnosis not present

## 2016-07-30 DIAGNOSIS — M25552 Pain in left hip: Secondary | ICD-10-CM | POA: Diagnosis not present

## 2016-07-30 DIAGNOSIS — F411 Generalized anxiety disorder: Secondary | ICD-10-CM | POA: Diagnosis not present

## 2016-07-30 DIAGNOSIS — Z79891 Long term (current) use of opiate analgesic: Secondary | ICD-10-CM | POA: Diagnosis not present

## 2016-08-01 DIAGNOSIS — R0602 Shortness of breath: Secondary | ICD-10-CM | POA: Diagnosis not present

## 2016-08-02 ENCOUNTER — Ambulatory Visit: Payer: Self-pay

## 2016-08-02 DIAGNOSIS — R0789 Other chest pain: Secondary | ICD-10-CM | POA: Diagnosis not present

## 2016-08-02 DIAGNOSIS — I517 Cardiomegaly: Secondary | ICD-10-CM | POA: Diagnosis not present

## 2016-08-02 DIAGNOSIS — M791 Myalgia: Secondary | ICD-10-CM | POA: Diagnosis not present

## 2016-08-02 DIAGNOSIS — G8929 Other chronic pain: Secondary | ICD-10-CM | POA: Diagnosis not present

## 2016-08-02 DIAGNOSIS — Z87891 Personal history of nicotine dependence: Secondary | ICD-10-CM | POA: Diagnosis not present

## 2016-08-02 DIAGNOSIS — Z5181 Encounter for therapeutic drug level monitoring: Secondary | ICD-10-CM | POA: Diagnosis not present

## 2016-08-02 DIAGNOSIS — I440 Atrioventricular block, first degree: Secondary | ICD-10-CM | POA: Diagnosis not present

## 2016-08-02 DIAGNOSIS — R0602 Shortness of breath: Secondary | ICD-10-CM | POA: Diagnosis not present

## 2016-08-02 DIAGNOSIS — Z951 Presence of aortocoronary bypass graft: Secondary | ICD-10-CM | POA: Diagnosis not present

## 2016-08-02 DIAGNOSIS — R062 Wheezing: Secondary | ICD-10-CM | POA: Diagnosis not present

## 2016-08-02 DIAGNOSIS — R079 Chest pain, unspecified: Secondary | ICD-10-CM | POA: Diagnosis not present

## 2016-08-03 DIAGNOSIS — I4891 Unspecified atrial fibrillation: Secondary | ICD-10-CM | POA: Diagnosis not present

## 2016-08-03 DIAGNOSIS — G8929 Other chronic pain: Secondary | ICD-10-CM | POA: Diagnosis not present

## 2016-08-03 DIAGNOSIS — F419 Anxiety disorder, unspecified: Secondary | ICD-10-CM | POA: Diagnosis not present

## 2016-08-03 DIAGNOSIS — I2510 Atherosclerotic heart disease of native coronary artery without angina pectoris: Secondary | ICD-10-CM | POA: Diagnosis not present

## 2016-08-03 DIAGNOSIS — I5032 Chronic diastolic (congestive) heart failure: Secondary | ICD-10-CM | POA: Diagnosis not present

## 2016-08-03 DIAGNOSIS — J449 Chronic obstructive pulmonary disease, unspecified: Secondary | ICD-10-CM | POA: Diagnosis not present

## 2016-08-03 DIAGNOSIS — F339 Major depressive disorder, recurrent, unspecified: Secondary | ICD-10-CM | POA: Diagnosis not present

## 2016-08-03 DIAGNOSIS — T814XXA Infection following a procedure, initial encounter: Secondary | ICD-10-CM | POA: Diagnosis not present

## 2016-08-03 DIAGNOSIS — I110 Hypertensive heart disease with heart failure: Secondary | ICD-10-CM | POA: Diagnosis not present

## 2016-08-04 ENCOUNTER — Ambulatory Visit: Payer: Self-pay

## 2016-08-05 ENCOUNTER — Ambulatory Visit: Payer: Self-pay

## 2016-08-05 DIAGNOSIS — Z5181 Encounter for therapeutic drug level monitoring: Secondary | ICD-10-CM | POA: Diagnosis not present

## 2016-08-05 DIAGNOSIS — E7800 Pure hypercholesterolemia, unspecified: Secondary | ICD-10-CM | POA: Diagnosis not present

## 2016-08-05 DIAGNOSIS — J449 Chronic obstructive pulmonary disease, unspecified: Secondary | ICD-10-CM | POA: Diagnosis not present

## 2016-08-05 DIAGNOSIS — R0789 Other chest pain: Secondary | ICD-10-CM | POA: Diagnosis not present

## 2016-08-05 DIAGNOSIS — G4700 Insomnia, unspecified: Secondary | ICD-10-CM | POA: Diagnosis not present

## 2016-08-05 DIAGNOSIS — J45909 Unspecified asthma, uncomplicated: Secondary | ICD-10-CM | POA: Diagnosis not present

## 2016-08-05 DIAGNOSIS — R918 Other nonspecific abnormal finding of lung field: Secondary | ICD-10-CM | POA: Diagnosis not present

## 2016-08-05 DIAGNOSIS — I25118 Atherosclerotic heart disease of native coronary artery with other forms of angina pectoris: Secondary | ICD-10-CM | POA: Diagnosis not present

## 2016-08-05 DIAGNOSIS — F411 Generalized anxiety disorder: Secondary | ICD-10-CM | POA: Diagnosis not present

## 2016-08-05 DIAGNOSIS — R079 Chest pain, unspecified: Secondary | ICD-10-CM | POA: Diagnosis not present

## 2016-08-05 DIAGNOSIS — Z87891 Personal history of nicotine dependence: Secondary | ICD-10-CM | POA: Diagnosis not present

## 2016-08-06 ENCOUNTER — Ambulatory Visit: Payer: Self-pay

## 2016-08-06 DIAGNOSIS — I110 Hypertensive heart disease with heart failure: Secondary | ICD-10-CM | POA: Diagnosis not present

## 2016-08-06 DIAGNOSIS — J449 Chronic obstructive pulmonary disease, unspecified: Secondary | ICD-10-CM | POA: Diagnosis not present

## 2016-08-06 DIAGNOSIS — F419 Anxiety disorder, unspecified: Secondary | ICD-10-CM | POA: Diagnosis not present

## 2016-08-06 DIAGNOSIS — F339 Major depressive disorder, recurrent, unspecified: Secondary | ICD-10-CM | POA: Diagnosis not present

## 2016-08-06 DIAGNOSIS — G8929 Other chronic pain: Secondary | ICD-10-CM | POA: Diagnosis not present

## 2016-08-06 DIAGNOSIS — I2510 Atherosclerotic heart disease of native coronary artery without angina pectoris: Secondary | ICD-10-CM | POA: Diagnosis not present

## 2016-08-06 DIAGNOSIS — I4891 Unspecified atrial fibrillation: Secondary | ICD-10-CM | POA: Diagnosis not present

## 2016-08-06 DIAGNOSIS — I5032 Chronic diastolic (congestive) heart failure: Secondary | ICD-10-CM | POA: Diagnosis not present

## 2016-08-06 DIAGNOSIS — T814XXA Infection following a procedure, initial encounter: Secondary | ICD-10-CM | POA: Diagnosis not present

## 2016-08-09 ENCOUNTER — Ambulatory Visit: Payer: Self-pay

## 2016-08-10 ENCOUNTER — Ambulatory Visit: Payer: Self-pay

## 2016-08-11 ENCOUNTER — Ambulatory Visit: Payer: Self-pay

## 2016-08-12 ENCOUNTER — Ambulatory Visit: Payer: Self-pay

## 2016-08-13 ENCOUNTER — Ambulatory Visit: Payer: Self-pay

## 2016-08-15 DIAGNOSIS — N400 Enlarged prostate without lower urinary tract symptoms: Secondary | ICD-10-CM | POA: Diagnosis not present

## 2016-08-15 DIAGNOSIS — K229 Disease of esophagus, unspecified: Secondary | ICD-10-CM | POA: Diagnosis not present

## 2016-08-15 DIAGNOSIS — R5381 Other malaise: Secondary | ICD-10-CM | POA: Diagnosis not present

## 2016-08-15 DIAGNOSIS — R197 Diarrhea, unspecified: Secondary | ICD-10-CM | POA: Diagnosis not present

## 2016-08-15 DIAGNOSIS — N179 Acute kidney failure, unspecified: Secondary | ICD-10-CM | POA: Diagnosis not present

## 2016-08-15 DIAGNOSIS — F339 Major depressive disorder, recurrent, unspecified: Secondary | ICD-10-CM | POA: Diagnosis not present

## 2016-08-15 DIAGNOSIS — J449 Chronic obstructive pulmonary disease, unspecified: Secondary | ICD-10-CM | POA: Diagnosis not present

## 2016-08-15 DIAGNOSIS — R112 Nausea with vomiting, unspecified: Secondary | ICD-10-CM | POA: Diagnosis not present

## 2016-08-15 DIAGNOSIS — Y33XXXD Other specified events, undetermined intent, subsequent encounter: Secondary | ICD-10-CM | POA: Diagnosis not present

## 2016-08-15 DIAGNOSIS — J9851 Mediastinitis: Secondary | ICD-10-CM | POA: Diagnosis not present

## 2016-08-15 DIAGNOSIS — I5032 Chronic diastolic (congestive) heart failure: Secondary | ICD-10-CM | POA: Diagnosis not present

## 2016-08-15 DIAGNOSIS — S21109D Unspecified open wound of unspecified front wall of thorax without penetration into thoracic cavity, subsequent encounter: Secondary | ICD-10-CM | POA: Diagnosis not present

## 2016-08-15 DIAGNOSIS — I2510 Atherosclerotic heart disease of native coronary artery without angina pectoris: Secondary | ICD-10-CM | POA: Diagnosis not present

## 2016-08-15 DIAGNOSIS — Z6841 Body Mass Index (BMI) 40.0 and over, adult: Secondary | ICD-10-CM | POA: Diagnosis not present

## 2016-08-15 DIAGNOSIS — I110 Hypertensive heart disease with heart failure: Secondary | ICD-10-CM | POA: Diagnosis not present

## 2016-08-15 DIAGNOSIS — R110 Nausea: Secondary | ICD-10-CM | POA: Diagnosis not present

## 2016-08-15 DIAGNOSIS — R509 Fever, unspecified: Secondary | ICD-10-CM | POA: Diagnosis not present

## 2016-08-15 DIAGNOSIS — Z79899 Other long term (current) drug therapy: Secondary | ICD-10-CM | POA: Diagnosis not present

## 2016-08-15 DIAGNOSIS — R1084 Generalized abdominal pain: Secondary | ICD-10-CM | POA: Diagnosis not present

## 2016-08-15 DIAGNOSIS — R918 Other nonspecific abnormal finding of lung field: Secondary | ICD-10-CM | POA: Diagnosis not present

## 2016-08-15 DIAGNOSIS — R630 Anorexia: Secondary | ICD-10-CM | POA: Diagnosis not present

## 2016-08-15 DIAGNOSIS — R69 Illness, unspecified: Secondary | ICD-10-CM | POA: Diagnosis not present

## 2016-08-16 DIAGNOSIS — N400 Enlarged prostate without lower urinary tract symptoms: Secondary | ICD-10-CM | POA: Diagnosis not present

## 2016-08-16 DIAGNOSIS — Y33XXXD Other specified events, undetermined intent, subsequent encounter: Secondary | ICD-10-CM | POA: Diagnosis not present

## 2016-08-16 DIAGNOSIS — J9851 Mediastinitis: Secondary | ICD-10-CM | POA: Diagnosis not present

## 2016-08-16 DIAGNOSIS — I110 Hypertensive heart disease with heart failure: Secondary | ICD-10-CM | POA: Diagnosis not present

## 2016-08-16 DIAGNOSIS — R918 Other nonspecific abnormal finding of lung field: Secondary | ICD-10-CM | POA: Diagnosis not present

## 2016-08-16 DIAGNOSIS — I5032 Chronic diastolic (congestive) heart failure: Secondary | ICD-10-CM | POA: Diagnosis not present

## 2016-08-16 DIAGNOSIS — N179 Acute kidney failure, unspecified: Secondary | ICD-10-CM | POA: Diagnosis not present

## 2016-08-16 DIAGNOSIS — J449 Chronic obstructive pulmonary disease, unspecified: Secondary | ICD-10-CM | POA: Diagnosis not present

## 2016-08-16 DIAGNOSIS — Z6841 Body Mass Index (BMI) 40.0 and over, adult: Secondary | ICD-10-CM | POA: Diagnosis not present

## 2016-08-16 DIAGNOSIS — Z79899 Other long term (current) drug therapy: Secondary | ICD-10-CM | POA: Diagnosis not present

## 2016-08-16 DIAGNOSIS — I2510 Atherosclerotic heart disease of native coronary artery without angina pectoris: Secondary | ICD-10-CM | POA: Diagnosis not present

## 2016-08-16 DIAGNOSIS — S21109D Unspecified open wound of unspecified front wall of thorax without penetration into thoracic cavity, subsequent encounter: Secondary | ICD-10-CM | POA: Diagnosis not present

## 2016-08-16 DIAGNOSIS — R509 Fever, unspecified: Secondary | ICD-10-CM | POA: Diagnosis not present

## 2016-08-16 DIAGNOSIS — F339 Major depressive disorder, recurrent, unspecified: Secondary | ICD-10-CM | POA: Diagnosis not present

## 2016-08-16 DIAGNOSIS — K229 Disease of esophagus, unspecified: Secondary | ICD-10-CM | POA: Diagnosis not present

## 2016-08-16 DIAGNOSIS — R112 Nausea with vomiting, unspecified: Secondary | ICD-10-CM | POA: Diagnosis not present

## 2016-08-16 DIAGNOSIS — R197 Diarrhea, unspecified: Secondary | ICD-10-CM | POA: Diagnosis not present

## 2016-08-17 ENCOUNTER — Ambulatory Visit: Payer: Self-pay

## 2016-08-18 ENCOUNTER — Ambulatory Visit: Payer: Self-pay

## 2016-08-18 DIAGNOSIS — I10 Essential (primary) hypertension: Secondary | ICD-10-CM | POA: Diagnosis not present

## 2016-08-18 DIAGNOSIS — F411 Generalized anxiety disorder: Secondary | ICD-10-CM | POA: Diagnosis not present

## 2016-08-18 DIAGNOSIS — G4709 Other insomnia: Secondary | ICD-10-CM | POA: Diagnosis not present

## 2016-08-18 DIAGNOSIS — I2510 Atherosclerotic heart disease of native coronary artery without angina pectoris: Secondary | ICD-10-CM | POA: Diagnosis not present

## 2016-08-19 ENCOUNTER — Other Ambulatory Visit: Payer: Self-pay

## 2016-08-19 DIAGNOSIS — I5032 Chronic diastolic (congestive) heart failure: Secondary | ICD-10-CM | POA: Diagnosis not present

## 2016-08-19 DIAGNOSIS — T814XXA Infection following a procedure, initial encounter: Secondary | ICD-10-CM | POA: Diagnosis not present

## 2016-08-19 DIAGNOSIS — J449 Chronic obstructive pulmonary disease, unspecified: Secondary | ICD-10-CM | POA: Diagnosis not present

## 2016-08-19 DIAGNOSIS — F339 Major depressive disorder, recurrent, unspecified: Secondary | ICD-10-CM | POA: Diagnosis not present

## 2016-08-19 DIAGNOSIS — I2510 Atherosclerotic heart disease of native coronary artery without angina pectoris: Secondary | ICD-10-CM | POA: Diagnosis not present

## 2016-08-19 DIAGNOSIS — F419 Anxiety disorder, unspecified: Secondary | ICD-10-CM | POA: Diagnosis not present

## 2016-08-19 DIAGNOSIS — I110 Hypertensive heart disease with heart failure: Secondary | ICD-10-CM | POA: Diagnosis not present

## 2016-08-19 DIAGNOSIS — I4891 Unspecified atrial fibrillation: Secondary | ICD-10-CM | POA: Diagnosis not present

## 2016-08-19 DIAGNOSIS — G8929 Other chronic pain: Secondary | ICD-10-CM | POA: Diagnosis not present

## 2016-08-19 NOTE — Patient Outreach (Signed)
[facility]) Care Management  08/19/2016  [name] DICKHAUT Sr. Sep 16, 1955 [id number]   Plan:  Referral Date: 06/18/2016 Screening and Initial Assessment:  06/21/16 Telephonic RN CM date:  06/21/16 Program:  CHF 06/21/16  Outreach call to patient.  Patient not reached.  RN CM left HIPAA complaint voice message with name and number.  RN CM scheduled for next outreach call within one week.   Simmie Davies, BSN, RN, CCM  [facility] [employer] [facility] Care Management Coordinator [phone] Direct [phone] Cell [phone] Office [phone] Fax [email or web address]

## 2016-08-23 DIAGNOSIS — I219 Acute myocardial infarction, unspecified: Secondary | ICD-10-CM

## 2016-08-23 HISTORY — DX: Acute myocardial infarction, unspecified: I21.9

## 2016-08-24 ENCOUNTER — Ambulatory Visit: Payer: Self-pay

## 2016-08-25 ENCOUNTER — Ambulatory Visit: Payer: Self-pay

## 2016-08-25 DIAGNOSIS — I10 Essential (primary) hypertension: Secondary | ICD-10-CM | POA: Diagnosis not present

## 2016-08-25 DIAGNOSIS — F411 Generalized anxiety disorder: Secondary | ICD-10-CM | POA: Diagnosis not present

## 2016-08-25 DIAGNOSIS — Z951 Presence of aortocoronary bypass graft: Secondary | ICD-10-CM | POA: Diagnosis not present

## 2016-08-25 DIAGNOSIS — F339 Major depressive disorder, recurrent, unspecified: Secondary | ICD-10-CM | POA: Diagnosis not present

## 2016-08-25 DIAGNOSIS — G4733 Obstructive sleep apnea (adult) (pediatric): Secondary | ICD-10-CM | POA: Diagnosis not present

## 2016-08-25 DIAGNOSIS — R001 Bradycardia, unspecified: Secondary | ICD-10-CM | POA: Diagnosis not present

## 2016-08-25 DIAGNOSIS — R079 Chest pain, unspecified: Secondary | ICD-10-CM | POA: Diagnosis not present

## 2016-08-25 DIAGNOSIS — Z9889 Other specified postprocedural states: Secondary | ICD-10-CM | POA: Diagnosis not present

## 2016-08-25 DIAGNOSIS — I200 Unstable angina: Secondary | ICD-10-CM | POA: Diagnosis not present

## 2016-08-25 DIAGNOSIS — R0602 Shortness of breath: Secondary | ICD-10-CM | POA: Diagnosis not present

## 2016-08-25 DIAGNOSIS — R072 Precordial pain: Secondary | ICD-10-CM | POA: Diagnosis not present

## 2016-08-25 DIAGNOSIS — I2510 Atherosclerotic heart disease of native coronary artery without angina pectoris: Secondary | ICD-10-CM | POA: Diagnosis not present

## 2016-08-25 DIAGNOSIS — I4510 Unspecified right bundle-branch block: Secondary | ICD-10-CM | POA: Diagnosis not present

## 2016-08-25 DIAGNOSIS — Z5181 Encounter for therapeutic drug level monitoring: Secondary | ICD-10-CM | POA: Diagnosis not present

## 2016-08-25 DIAGNOSIS — J441 Chronic obstructive pulmonary disease with (acute) exacerbation: Secondary | ICD-10-CM | POA: Diagnosis not present

## 2016-08-25 DIAGNOSIS — R0789 Other chest pain: Secondary | ICD-10-CM | POA: Diagnosis not present

## 2016-08-25 DIAGNOSIS — I440 Atrioventricular block, first degree: Secondary | ICD-10-CM | POA: Diagnosis not present

## 2016-08-26 ENCOUNTER — Ambulatory Visit: Payer: Self-pay

## 2016-08-26 DIAGNOSIS — R0789 Other chest pain: Secondary | ICD-10-CM | POA: Diagnosis not present

## 2016-08-26 DIAGNOSIS — I2510 Atherosclerotic heart disease of native coronary artery without angina pectoris: Secondary | ICD-10-CM | POA: Diagnosis not present

## 2016-08-26 DIAGNOSIS — R062 Wheezing: Secondary | ICD-10-CM | POA: Diagnosis not present

## 2016-08-26 DIAGNOSIS — J449 Chronic obstructive pulmonary disease, unspecified: Secondary | ICD-10-CM | POA: Diagnosis not present

## 2016-08-26 DIAGNOSIS — F1190 Opioid use, unspecified, uncomplicated: Secondary | ICD-10-CM | POA: Diagnosis not present

## 2016-08-26 DIAGNOSIS — I10 Essential (primary) hypertension: Secondary | ICD-10-CM | POA: Diagnosis not present

## 2016-08-27 ENCOUNTER — Ambulatory Visit: Payer: Self-pay

## 2016-08-30 ENCOUNTER — Ambulatory Visit: Payer: Self-pay

## 2016-08-30 DIAGNOSIS — Z09 Encounter for follow-up examination after completed treatment for conditions other than malignant neoplasm: Secondary | ICD-10-CM | POA: Diagnosis not present

## 2016-08-30 DIAGNOSIS — G4733 Obstructive sleep apnea (adult) (pediatric): Secondary | ICD-10-CM | POA: Diagnosis not present

## 2016-08-30 DIAGNOSIS — K5909 Other constipation: Secondary | ICD-10-CM | POA: Diagnosis not present

## 2016-08-30 DIAGNOSIS — J449 Chronic obstructive pulmonary disease, unspecified: Secondary | ICD-10-CM | POA: Diagnosis not present

## 2016-08-30 DIAGNOSIS — G8928 Other chronic postprocedural pain: Secondary | ICD-10-CM | POA: Diagnosis not present

## 2016-08-30 DIAGNOSIS — M545 Low back pain: Secondary | ICD-10-CM | POA: Diagnosis not present

## 2016-08-30 DIAGNOSIS — G8929 Other chronic pain: Secondary | ICD-10-CM | POA: Diagnosis not present

## 2016-08-30 DIAGNOSIS — F3340 Major depressive disorder, recurrent, in remission, unspecified: Secondary | ICD-10-CM | POA: Diagnosis not present

## 2016-08-31 ENCOUNTER — Other Ambulatory Visit: Payer: Self-pay

## 2016-08-31 NOTE — Patient Outreach (Signed)
[facility]) Care Management  08/31/2016  RIORDAN WALLE Sr. [birth year] [id number]   Telephonic Transitions of Care and Monthly Assessment   Referral Date:  06/18/2016 Source:  [employer]  (72 hour referral request) Issue:  (Info received from [facility])  Member is looking for vision assistance and obtaining glasses.  Consented to referral to CM for vision assistance and management of conditions.  H/o 2 heart attacks and surgeries; currently has tube in chest, headaches, takes prostate medication.  Denies Diabetes; Diabetes noted in claims.  Lost wife last year and feeling down and stressed due to health.  Recent Admission / discharge date:  04/23/16.  Addressed alerts.  Verified phone number as [phone].    InsuranceJosephine Igo [facility] [id number] -per patient:  renewed for 2018:  Medicaid Trainer  Subjective:   Outreach call #2 to patient. Patient reached. Completed call.   Patient prefers to be call "[name]". OLD Address:  [place] [address] [phone] [facility])  NEW Address  [address]  [place], [place]  [address]    Providers: Primary Care:  Dr. Ronnald Nian and Stefano Gaul PA [phone] -  Last appt.  08/30/2016 (post hospital follow-up appt). H/o past Primary MD:  Dr. Velvet Bathe A. [name]  - last appt 12/07/2012 Thoracic Surgeon:  Dr. Maryan Puls [place], [place]  Psycho/Social: 61 yo male widower and lived alone in his [place] home until over the past year since requiring so many cardiac surgeries and needing 24/7 supervised care and assistance.  Patient is now residing with his son in [place], [place] for care and to remain close to his providers within the [facility].   Patient states his wife passed suddenly 04/15/15 from MI and Stroke at the age of 61 yo.    Mobility: States ambulating with difficulty and reports 5-6 falls over the past month associated to dizziness and weakness.  Pain: chronic bilateral back pain managed with medications.   Depression: Grieving over loss of his wife; on antidepressant and states well managed.  Transportation: [place] Po[place] or family.  States needs transportation assistance to and from MD appt's.  Caregiver and Emergency Contact: Bienvenido, [place] [phone] - son works 3rd shift in a hospital.  Advance Directive:  None.  Patient states his son would be his HCPOA and make all decisions but does not think he has completed an Forensic scientist.  Patient request that RN CM contact son to verify -  Consent:  Gives verbal consent to discuss PHI and healthcare management needs with son.  Resources: SSD DME: (Glasses lost via [place])   Co-morbidities:  COPD, CHF, Essential HTN, CAD, Hyperlipidemia, GERD, Chronic bilateral back pain without sciatica; Chronic constipation; , Arthritis, Anxiety, Recurrent major Depression in remission;  Obesity, NSTEMI, triple bypass (09/2015 at [facility]). H/o alcohol and tobacco use and urine drug screen positive for cocaine 02/2016; chronic narcotic use.     Admissions:  Recent 08/25/16 - 08/26/16 - [facility] - Chest pain, unstable angina [facility] follow-up appt completed 1/8.  Patient states MD ordered sleep apnea test.  States chronic shortness of breath; using two different inhalers and nebulizer treatments 4 times a day. Patient states he needs to go back into SNF / Rehab for at least 2 weeks.  States h/o falls 5-6 over the past month.   Patient states he needs bathroom hand rails.  States he needs more than [facility] PT services can offer.  States he  does not have transportation to go to OP Rehab and does not feel safe at home alone while family works.   Patient confirms he did not discuss with his MD/PCP on yesterday's appt on 08/30/16 because he forgot).  Patient requesting that RN CM contact MD and request.  Denies any active [facility] services since discharge date two weeks ago.   Past Admission history  04/23/16 ([facility])  [facility] to [facility]  for Rehab 02/25/2016 - 02/26/2016  chest pain, syncope, SOB, LE edema ([place]) 09/28/15 (first surgery date) and 8 surgeries since that time.  Last surgery date 03/2016.   Lipid Panel completed 07/13/2015 HDL 57.000 07/13/2015 LDL 85.000 07/13/2015 Cholesterol, total 151.000 07/13/2015 Triglycerides 45.000 07/13/2015 A1C 5.700 07/13/2015 Glucose Random 108.000 02/26/2016  BP 100/58 02/25/2016 Weight 264 lb (120 kg) 02/25/2016  (patient states his weight is down 139 lbs from a size 46 to a size 38).  Height 75 in (191 cm) 02/25/2016 BMI 33.10 (Obese Class I) 02/25/2016  Medications:  Patient taking 15 medications  Co-pay cost issues: none Prevnar (PCV13) N/D Pneumovax (PPS N/D Flu Vaccine 05/29/2008 tDAP Vaccine N/D  Encounter Medications:  Outpatient Encounter Prescriptions as of 08/31/2016  Medication Sig Note  . ALPRAZolam (XANAX) 1 MG tablet Take 1 tablet (1 mg total) by mouth 3 (three) times daily as needed for anxiety. For anxiety (Patient taking differently: Take 1 mg by mouth 3 (three) times daily. For anxiety)   . amiodarone (PACERONE) 200 MG tablet Take 1 tablet by mouth daily.   Marland Kitchen amLODipine (NORVASC) 10 MG tablet Take 10 mg by mouth daily.   Marland Kitchen aspirin 81 MG tablet Take 1 tablet (81 mg total) by mouth daily.   Marland Kitchen atorvastatin (LIPITOR) 10 MG tablet TAKE (1) TABLET BY MOUTH ONCE DAILY.   . budesonide-formoterol (SYMBICORT) 160-4.5 MCG/ACT inhaler Inhale 2 Inhalers into the lungs 2 (two) times daily. 08/31/2016: Received from: [place]: Inhale 2 inhalations into the lungs 2 (two) times daily.  . citalopram (CELEXA) 10 MG tablet Take 10 mg by mouth every morning.   . furosemide (LASIX) 40 MG tablet Take 40 mg by mouth every morning.   . gabapentin (NEURONTIN) 300 MG capsule Take 1 capsule by mouth 3 (three) times daily. 08/31/2016: Received from: [place]: Take 1 capsule (300 mg total) by mouth 3 (three) times daily.  .  hydrochlorothiazide (HYDRODIURIL) 25 MG tablet Take 25 mg by mouth every morning.  09/18/2014: .  . ipratropium-albuterol (DUONEB) 0.5-2.5 (3) MG/3ML SOLN Take 3 mLs by nebulization every 6 (six) hours as needed (shortness of breath/ wheezing.).    Marland Kitchen isosorbide mononitrate (IMDUR) 30 MG 24 hr tablet Take 1 tablet (30 mg total) by mouth 2 (two) times daily. (Patient taking differently: Take 30 mg by mouth every morning. )   . metoprolol tartrate (LOPRESSOR) 25 MG tablet Take 1 tablet by mouth daily.   . nitroGLYCERIN (NITROSTAT) 0.4 MG SL tablet Place 0.4 mg under the tongue every 5 (five) minutes as needed. For chest pain 02/25/2016: 2 doses today.  Marland Kitchen omeprazole (PRILOSEC) 20 MG capsule Take 20 mg by mouth every morning.    . ondansetron (ZOFRAN) 4 MG tablet Take 1 tablet by mouth 2 (two) times daily as needed.   Marland Kitchen oxyCODONE-acetaminophen (PERCOCET) 10-325 MG per tablet Take 1 tablet by mouth 3 (three) times daily.    . potassium chloride SA (K-DUR,KLOR-CON) 20 MEQ tablet Take 20 mEq by mouth every morning.    Marland Kitchen  predniSONE (DELTASONE) 10 MG tablet Take 10 mg by mouth as directed. 08/31/2016: Received from: [place]: Take 3 tablets for FOUR days, then 2 tablets for FOUR days, then 1 tablet for FOUR days.  Marland Kitchen PROAIR HFA 108 (90 BASE) MCG/ACT inhaler Inhale 2 puffs into the lungs daily as needed for wheezing or shortness of breath.    . senna-docusate (SENOKOT-S) 8.6-50 MG tablet Take 2 tablets by mouth 2 (two) times daily. 08/31/2016: Received from: [place]: Take 2 tablets by mouth 2 (two) times daily.  . tamsulosin (FLOMAX) 0.4 MG CAPS capsule Take 1 capsule by mouth daily.   Marland Kitchen tiotropium (SPIRIVA) 18 MCG inhalation capsule Place 18 mcg into inhaler and inhale daily.   Marland Kitchen VITAMIN E PO Take 1 tablet by mouth daily.   . budesonide-formoterol (SYMBICORT) 160-4.5 MCG/ACT inhaler Inhale 2 puffs into the lungs 2 (two) times daily.   Marland Kitchen doxepin  (SINEQUAN) 25 MG capsule Take 25 mg by mouth at bedtime.  02/25/2016: Patient needs a refill.  . meloxicam (MOBIC) 15 MG tablet Take 15 mg by mouth every morning.    . ranolazine (RANEXA) 500 MG 12 hr tablet Take 1 tablet (500 mg total) by mouth 2 (two) times daily. (Patient not taking: Reported on 08/31/2016)   . VOLTAREN 1 % GEL Apply 2 g topically daily as needed (pain).  07/11/2015: Uses on back and hips as needed   No facility-administered encounter medications on file as of 08/31/2016.     Functional Status:  In your present state of health, do you have any difficulty performing the following activities: 08/31/2016 06/21/2016  Hearing? N N  Vision? Y Y  Difficulty concentrating or making decisions? N N  Walking or climbing stairs? Y N  Dressing or bathing? N N  Doing errands, shopping? Tempie Donning  Preparing Food and eating ? N N  Using the Toilet? N N  In the past six months, have you accidently leaked urine? N N  Do you have problems with loss of bowel control? N N  Managing your Medications? N N  Managing your Finances? N N  Housekeeping or managing your Housekeeping? Y -  Some recent data might be hidden    Fall/Depression Screening: PHQ 2/9 Scores 06/21/2016  PHQ - 2 Score 0    Fall Risk  08/31/2016 06/21/2016  Falls in the past year? Yes Yes  Number falls in past yr: 2 or more 2 or more  Injury with Fall? No No  Risk Factor Category  High Fall Risk High Fall Risk  Risk for fall due to : History of fall(s);Impaired balance/gait;Impaired mobility;Impaired vision;Medication side effect History of fall(s);Impaired vision  Follow up Falls evaluation completed;Education provided;Falls prevention discussed Falls evaluation completed;Education provided;Falls prevention discussed    Preventives: Hearing: never screened / no issues Eyes:  (Glasses lost via [facility]).  Patient states he has made numerous calls to the facility but no one will call him back.  Patient states he is  currently has no prescription glasses and only has reading glasses.  States he needs his glasses and seeing has been very difficult as readers do not help with distant needs such as watching TV.  Patient states his last glasses cost him $480.00 and had only had them for two months.  States his eye MD office advised that patient will need a new eye exam due to last exam over one year ago.  Patient states he can  go to a local [employer] in [place] to get an exam and new glasses but needs assistance with the cost of the glasses.     Dentist:  None:  Dentures: upper and lower Colonoscopy:  (unknown) Smoker:  Quit 09/15/15  Assessment: RAF Score:  2.566 Patient currently requires 24/7 supervised care and assistance.  H/o multiple admissions and rehab admission over the past 10 months.  -High Risk for medication error and falls due to vision deficit (no glasses).   Plan:  Referral Date:  06/18/2016 Screening and Initial Assessment:  06/21/16 Telephonic RN CM date:  06/21/16 Program:   -CHF 06/21/16 - 08/31/16 -Transition of Care (Short term) 08/31/16 -   LTC planning needs; home safety; falls  Patient refusing [place] services.  RN CM advised patient must have MD evaluation to determine medical necessity for SNF / Rehab admission from home.RN CM had difficulty communicating what is required to address this need.  Patient would benefit from HSE to assess safety and DME needs in the home.     CHF Emmi Educational Materials (mailed 06/21/16 - reviewed 08/31/2016) -Heart Failure:  Understanding Water Pills and Thirst -Heart Failure:  Keeping Track Of Your Weight Each Day -Heart Failure:  How To Be Salt Smart -Heart Failure:  When To Call Your Doctor Or 911 -Heart Zones Magnet   Advance Directives -Advance Directive Document (mailed 06/21/16 - received / reviewed 08/31/2016 -RN CM will continue to provide supportive education and assistance as needed.  -RN CM will contact son to verify if patient has completed  or not.   Benefits: -RN CM advised patient to contact [employer] to request address update to avoid delays in claims processing and issues with out of network services in [place] Work Referral (06/21/16 / completed 06/23/17 ) -Resources for eyeglasses.  (patient has been unsuccessful with efforts and still needs help). H/o 5-6 falls over the past month.   -DME : Bathroom grab rails -Transportation to and from MD appt's. (patient does not drive; family work) call.  -Patient states he does not have SW Contact information:  Patient unable to write down at time of call [facility] Dareen Piano, [employer], [phone]).  Address / PCP Update / Eligibility: RN CM sent in-basket message to [place] Management Assistant: Patient has [employer] in 2018 Demographics Update request:  New Address:  [address].  [place], [place]  [address] Is he still eligible for [facility] services living outside of [place]? He has not updated his address with [employer] yet and is getting out of network denials for claims in the [place] area.   RN CM advised patient to contact [place] # on insurance card and notify of new address as soon as possible.   Primary Care:  Dr. Ronnald Nian and Stefano Gaul PA [phone] -  Last appt.  08/30/2016 (post hospital follow-up appt).  RN CM advised in next [facility] scheduled contact call within next 30 days for monthly assessment and / or care coordination services as needed. RN CM advised to please notify MD of any changes in condition prior to scheduled appt's.   RN CM provided contact name and # [phone] or main office # [phone] and 24-hour nurse line # [phone].  RN CM confirmed patient is aware of 911 services for urgent emergency needs.  [facility] CM Care Plan Problem One   Flowsheet Row Most Recent Value  Care Plan Problem One  LTC planning needs   Role Documenting the Problem  One  Care Management Telephonic [place] for  Problem One  Active  THN Long Term Goal (31-90 days)  Patient will engage with SW for acute and LT planning needs over the next 31-90 days.   THN Long Term Goal Start Date  08/31/16  THN Long Term Goal Met Date  08/31/16  Interventions for Problem One Long Term Goal  RN CM will make SW Referaal and provide patient wit education of options over the next 31-90 days.   THN CM Short Term Goal #1 (0-30 days)  Patient will adhere to MD recommendations for acute managmenet of safety and falll prevention in the home over the next 30 days.   THN CM Short Term Goal #1 Start Date  08/31/16  [facility] CM Short Term Goal #1 Met Date  08/31/16  Interventions for Short Term Goal #1  RN CM will consult MD regarding patients request for SNF rehab admission over the next 30 days.     [facility] CM Care Plan Problem Two   Flowsheet Row Most Recent Value  Care Plan Problem Two  Potential for medication error associated to Visual Deficit associated to loss of glasses.   Role Documenting the Problem Two  Care Management Telephonic Coordinator  Care Plan for Problem Two  Active  THN CM Short Term Goal #1 (0-30 days)  Patient will discuss SW resource recommendations for glasses with his son over the next 30 days.   THN CM Short Term Goal #1 Start Date  08/31/16  [facility] CM Short Term Goal #1 Met Date   08/31/16  Interventions for Short Term Goal #2   RN CM will send new SW REferral to follow-up with patient on unresolved eyeglasses resource issues over the next 30 days.     [facility] CM Care Plan Problem Three   Flowsheet Row Most Recent Value  Care Plan Problem Three  Advance Directives   Role Documenting the Problem Three  Care Management Telephonic Coordinator  Care Plan for Problem Three  Active  THN Long Term Goal (31-90) days  Patient will completed Advance Directive over the next 31-90 days.   THN Long Term Goal Start Date  08/31/16  Interventions for Problem Three Long Term Goal  RN CM will provide on-going education and  assistance over the next 31-90 days.   THN CM Short Term Goal #1 (0-30 days)  Patient will review Advance Directive over the next 30 days.   THN CM Short Term Goal #1 Start Date  08/31/16  Interventions for Short Term Goal #1  RN CM will assess for Advance Directives review and questions over the next 30 days.       Nathaneil Canary, BSN, RN, [place] Management Care Management Coordinator [phone] Direct [phone] Cell [phone] Office [phone] Fax [email or web address]

## 2016-09-01 ENCOUNTER — Other Ambulatory Visit: Payer: Self-pay

## 2016-09-01 NOTE — Patient Outreach (Addendum)
[facility]) Care Management  09/01/2016  HUBER JAROSH Sr. 06/01/1956 [id number]  Telephonic Care Coordination Services  [facility] provides update that patient is no longer eligible for [facility] services at this time due to PCP is out of network for [facility]CO services.    Contact call to (Primary Care:  Dr. Bubba Camp and Melida Quitter PA [phone] -  Last appt.  08/30/2016 (post hospital follow-up appt) to provide update on patients needs as of 09/01/2016 and hand of care.   RN CM notified PCP office of patient's concerns:  -H/o 5-6 falls over the past month.   -DME : Bathroom grab rails -SNF / Rehab request -Refusal of HH PT services -Resources for eyeglasses.  (patient has been unsuccessful with efforts and still needs help).  -Transportation to and from MD appt's. (patient does not drive; family work) call.  -Advance Directives   -RN CM advised patient to contact [employer] to request address update to avoid delays in claims processing and issues with out of network services in [facility]  This Clinical research associate (RN CM) would recommend the following:    -HH PT services:  HSE for [employer], DME needs, Bathroom grab rails.  -HH SW:  [employer], Teacher, adult education, transportation resources, review of Merchant navy officer with patient's son; continued monitoring of patient's accountability with notifying [employer] of address change and LTC planning for possible ALF as patient reports concerns with being home alone while family is working.  MD discussion with patient regarding his request for placement for SNF / Rehab services.   Plan: RN CM left confidential voice message with the above for Dr. Noel Gerold.  RN CM left name and number for call back if further questions / concerns.  RN CM advised RN CM will fax notes to MD for further clarification.  RN CM notified [employer] of patient discharge from [facility] and requested Insurance CM services for continued Transition of Care Services.  RN CM  will notify patient of the above updates and close case to [facility] services.     Simmie Davies, BSN, RN, CCM  [facility] [employer] Management Care Management Coordinator [phone] Direct [phone] Cell [phone] Office [phone] Fax [email or web address]

## 2016-09-01 NOTE — Patient Outreach (Addendum)
[facility]) Care Management  09/01/2016  [name] Sr. Jun 19, 1956 [id number]   Telephonic Care Coordination of services   Contact call to patient to provide update on hand off of care to PCP.  RN CM advised patient is no-longer eligible for [facility] services and care will be handed off to PCP to establish services within patient's current geographic area at new address  NEW Address  [address]  [place], [place]  [address]   NEW PCP:  Dr. Bubba Camp and Melida Quitter PA [phone]   Plan:   RN CM notified [employer] of patient discharge from [facility] and requested Insurance CM services for continued Transition of Care Services. SECURE "NTSP/THN Transition"   [email or web address]   THN notified of Case closure. Case Closure letter to patient and MD.    Donato Schultz, MSHL, BSN, RN, CCM  [facility] Management Care Management Coordinator [phone] Direct [phone] Cell [phone] Office [phone] Fax [email or web address]

## 2016-09-03 DIAGNOSIS — T814XXA Infection following a procedure, initial encounter: Secondary | ICD-10-CM | POA: Diagnosis not present

## 2016-09-03 DIAGNOSIS — I110 Hypertensive heart disease with heart failure: Secondary | ICD-10-CM | POA: Diagnosis not present

## 2016-09-03 DIAGNOSIS — J449 Chronic obstructive pulmonary disease, unspecified: Secondary | ICD-10-CM | POA: Diagnosis not present

## 2016-09-03 DIAGNOSIS — F419 Anxiety disorder, unspecified: Secondary | ICD-10-CM | POA: Diagnosis not present

## 2016-09-03 DIAGNOSIS — I5032 Chronic diastolic (congestive) heart failure: Secondary | ICD-10-CM | POA: Diagnosis not present

## 2016-09-03 DIAGNOSIS — I2510 Atherosclerotic heart disease of native coronary artery without angina pectoris: Secondary | ICD-10-CM | POA: Diagnosis not present

## 2016-09-03 DIAGNOSIS — G8929 Other chronic pain: Secondary | ICD-10-CM | POA: Diagnosis not present

## 2016-09-03 DIAGNOSIS — I4891 Unspecified atrial fibrillation: Secondary | ICD-10-CM | POA: Diagnosis not present

## 2016-09-03 DIAGNOSIS — F339 Major depressive disorder, recurrent, unspecified: Secondary | ICD-10-CM | POA: Diagnosis not present

## 2016-09-07 NOTE — Telephone Encounter (Signed)
This encounter was created in error - please disregard.

## 2016-09-09 NOTE — Patient Outreach (Signed)
[facility]) Care Management  09/09/2016  [name] ORTMANN Sr. [birth year] [id number]   Phone message received from [facility].  Contact:  Lynnae Sandhoff, SW # [phone] received referral and following case.  Simmie Davies, BSN, RN, CCM  [facility] [employer] [facility] Care Management Coordinator [phone] Direct [phone] Cell [phone] Office [phone] Fax [email or web address]

## 2016-09-13 ENCOUNTER — Ambulatory Visit: Payer: Medicare HMO

## 2016-09-14 DIAGNOSIS — I4891 Unspecified atrial fibrillation: Secondary | ICD-10-CM | POA: Diagnosis not present

## 2016-09-14 DIAGNOSIS — I5032 Chronic diastolic (congestive) heart failure: Secondary | ICD-10-CM | POA: Diagnosis not present

## 2016-09-14 DIAGNOSIS — I2510 Atherosclerotic heart disease of native coronary artery without angina pectoris: Secondary | ICD-10-CM | POA: Diagnosis not present

## 2016-09-14 DIAGNOSIS — G8929 Other chronic pain: Secondary | ICD-10-CM | POA: Diagnosis not present

## 2016-09-14 DIAGNOSIS — I110 Hypertensive heart disease with heart failure: Secondary | ICD-10-CM | POA: Diagnosis not present

## 2016-09-14 DIAGNOSIS — T814XXA Infection following a procedure, initial encounter: Secondary | ICD-10-CM | POA: Diagnosis not present

## 2016-09-14 DIAGNOSIS — F419 Anxiety disorder, unspecified: Secondary | ICD-10-CM | POA: Diagnosis not present

## 2016-09-14 DIAGNOSIS — F339 Major depressive disorder, recurrent, unspecified: Secondary | ICD-10-CM | POA: Diagnosis not present

## 2016-09-14 DIAGNOSIS — J449 Chronic obstructive pulmonary disease, unspecified: Secondary | ICD-10-CM | POA: Diagnosis not present

## 2016-09-21 DIAGNOSIS — G8928 Other chronic postprocedural pain: Secondary | ICD-10-CM | POA: Diagnosis not present

## 2016-09-21 DIAGNOSIS — H1033 Unspecified acute conjunctivitis, bilateral: Secondary | ICD-10-CM | POA: Diagnosis not present

## 2016-09-21 DIAGNOSIS — I2510 Atherosclerotic heart disease of native coronary artery without angina pectoris: Secondary | ICD-10-CM | POA: Diagnosis not present

## 2016-09-21 DIAGNOSIS — Z22322 Carrier or suspected carrier of Methicillin resistant Staphylococcus aureus: Secondary | ICD-10-CM | POA: Diagnosis not present

## 2016-09-21 DIAGNOSIS — G8929 Other chronic pain: Secondary | ICD-10-CM | POA: Diagnosis not present

## 2016-09-21 DIAGNOSIS — I10 Essential (primary) hypertension: Secondary | ICD-10-CM | POA: Diagnosis not present

## 2016-09-21 DIAGNOSIS — T814XXD Infection following a procedure, subsequent encounter: Secondary | ICD-10-CM | POA: Diagnosis not present

## 2016-09-21 DIAGNOSIS — M545 Low back pain: Secondary | ICD-10-CM | POA: Diagnosis not present

## 2016-09-22 DIAGNOSIS — Z6841 Body Mass Index (BMI) 40.0 and over, adult: Secondary | ICD-10-CM | POA: Diagnosis not present

## 2016-09-22 DIAGNOSIS — Z5189 Encounter for other specified aftercare: Secondary | ICD-10-CM | POA: Diagnosis not present

## 2016-09-22 DIAGNOSIS — T814XXD Infection following a procedure, subsequent encounter: Secondary | ICD-10-CM | POA: Diagnosis not present

## 2016-09-22 DIAGNOSIS — I10 Essential (primary) hypertension: Secondary | ICD-10-CM | POA: Diagnosis not present

## 2016-09-22 DIAGNOSIS — I5030 Unspecified diastolic (congestive) heart failure: Secondary | ICD-10-CM | POA: Diagnosis not present

## 2016-09-22 DIAGNOSIS — R079 Chest pain, unspecified: Secondary | ICD-10-CM | POA: Diagnosis not present

## 2016-09-22 DIAGNOSIS — T814XXA Infection following a procedure, initial encounter: Secondary | ICD-10-CM | POA: Diagnosis not present

## 2016-09-22 DIAGNOSIS — D509 Iron deficiency anemia, unspecified: Secondary | ICD-10-CM | POA: Diagnosis not present

## 2016-09-22 DIAGNOSIS — R110 Nausea: Secondary | ICD-10-CM | POA: Diagnosis not present

## 2016-09-22 DIAGNOSIS — E785 Hyperlipidemia, unspecified: Secondary | ICD-10-CM | POA: Diagnosis not present

## 2016-09-22 DIAGNOSIS — I110 Hypertensive heart disease with heart failure: Secondary | ICD-10-CM | POA: Diagnosis not present

## 2016-09-22 DIAGNOSIS — R0602 Shortness of breath: Secondary | ICD-10-CM | POA: Diagnosis not present

## 2016-09-22 DIAGNOSIS — I2510 Atherosclerotic heart disease of native coronary artery without angina pectoris: Secondary | ICD-10-CM | POA: Diagnosis not present

## 2016-09-22 DIAGNOSIS — J449 Chronic obstructive pulmonary disease, unspecified: Secondary | ICD-10-CM | POA: Diagnosis not present

## 2016-09-22 DIAGNOSIS — M8668 Other chronic osteomyelitis, other site: Secondary | ICD-10-CM | POA: Diagnosis not present

## 2016-09-22 DIAGNOSIS — T8132XD Disruption of internal operation (surgical) wound, not elsewhere classified, subsequent encounter: Secondary | ICD-10-CM | POA: Diagnosis not present

## 2016-09-22 DIAGNOSIS — Z87898 Personal history of other specified conditions: Secondary | ICD-10-CM | POA: Diagnosis not present

## 2016-09-23 DIAGNOSIS — I2510 Atherosclerotic heart disease of native coronary artery without angina pectoris: Secondary | ICD-10-CM | POA: Diagnosis not present

## 2016-09-23 DIAGNOSIS — I10 Essential (primary) hypertension: Secondary | ICD-10-CM | POA: Diagnosis not present

## 2016-09-23 DIAGNOSIS — M8668 Other chronic osteomyelitis, other site: Secondary | ICD-10-CM | POA: Diagnosis not present

## 2016-09-23 DIAGNOSIS — I110 Hypertensive heart disease with heart failure: Secondary | ICD-10-CM | POA: Diagnosis not present

## 2016-09-23 DIAGNOSIS — T8132XD Disruption of internal operation (surgical) wound, not elsewhere classified, subsequent encounter: Secondary | ICD-10-CM | POA: Diagnosis not present

## 2016-09-23 DIAGNOSIS — I5030 Unspecified diastolic (congestive) heart failure: Secondary | ICD-10-CM | POA: Diagnosis not present

## 2016-09-23 DIAGNOSIS — Z87898 Personal history of other specified conditions: Secondary | ICD-10-CM | POA: Diagnosis not present

## 2016-09-23 DIAGNOSIS — Z6841 Body Mass Index (BMI) 40.0 and over, adult: Secondary | ICD-10-CM | POA: Diagnosis not present

## 2016-09-23 DIAGNOSIS — Z5189 Encounter for other specified aftercare: Secondary | ICD-10-CM | POA: Diagnosis not present

## 2016-09-23 DIAGNOSIS — E785 Hyperlipidemia, unspecified: Secondary | ICD-10-CM | POA: Diagnosis not present

## 2016-09-23 DIAGNOSIS — J449 Chronic obstructive pulmonary disease, unspecified: Secondary | ICD-10-CM | POA: Diagnosis not present

## 2016-09-23 DIAGNOSIS — T814XXD Infection following a procedure, subsequent encounter: Secondary | ICD-10-CM | POA: Diagnosis not present

## 2016-09-23 DIAGNOSIS — T814XXA Infection following a procedure, initial encounter: Secondary | ICD-10-CM | POA: Diagnosis not present

## 2016-09-23 DIAGNOSIS — D509 Iron deficiency anemia, unspecified: Secondary | ICD-10-CM | POA: Diagnosis not present

## 2016-09-24 DIAGNOSIS — Z5189 Encounter for other specified aftercare: Secondary | ICD-10-CM | POA: Diagnosis not present

## 2016-10-03 DIAGNOSIS — M545 Low back pain: Secondary | ICD-10-CM | POA: Diagnosis not present

## 2016-10-03 DIAGNOSIS — I5089 Other heart failure: Secondary | ICD-10-CM | POA: Diagnosis not present

## 2016-10-03 DIAGNOSIS — G8929 Other chronic pain: Secondary | ICD-10-CM | POA: Diagnosis not present

## 2016-10-03 DIAGNOSIS — J449 Chronic obstructive pulmonary disease, unspecified: Secondary | ICD-10-CM | POA: Diagnosis not present

## 2016-10-03 DIAGNOSIS — F329 Major depressive disorder, single episode, unspecified: Secondary | ICD-10-CM | POA: Diagnosis not present

## 2016-10-03 DIAGNOSIS — K449 Diaphragmatic hernia without obstruction or gangrene: Secondary | ICD-10-CM | POA: Diagnosis not present

## 2016-10-03 DIAGNOSIS — J441 Chronic obstructive pulmonary disease with (acute) exacerbation: Secondary | ICD-10-CM | POA: Diagnosis not present

## 2016-10-03 DIAGNOSIS — I110 Hypertensive heart disease with heart failure: Secondary | ICD-10-CM | POA: Diagnosis not present

## 2016-10-03 DIAGNOSIS — I2510 Atherosclerotic heart disease of native coronary artery without angina pectoris: Secondary | ICD-10-CM | POA: Diagnosis not present

## 2016-10-03 DIAGNOSIS — R072 Precordial pain: Secondary | ICD-10-CM | POA: Diagnosis not present

## 2016-10-03 DIAGNOSIS — R0789 Other chest pain: Secondary | ICD-10-CM | POA: Diagnosis not present

## 2016-10-03 DIAGNOSIS — I509 Heart failure, unspecified: Secondary | ICD-10-CM | POA: Diagnosis not present

## 2016-10-03 DIAGNOSIS — R079 Chest pain, unspecified: Secondary | ICD-10-CM | POA: Diagnosis not present

## 2016-10-04 DIAGNOSIS — J441 Chronic obstructive pulmonary disease with (acute) exacerbation: Secondary | ICD-10-CM | POA: Diagnosis not present

## 2016-10-04 DIAGNOSIS — I110 Hypertensive heart disease with heart failure: Secondary | ICD-10-CM | POA: Diagnosis not present

## 2016-10-04 DIAGNOSIS — I2510 Atherosclerotic heart disease of native coronary artery without angina pectoris: Secondary | ICD-10-CM | POA: Diagnosis not present

## 2016-10-04 DIAGNOSIS — I5089 Other heart failure: Secondary | ICD-10-CM | POA: Diagnosis not present

## 2016-10-04 DIAGNOSIS — R0789 Other chest pain: Secondary | ICD-10-CM | POA: Diagnosis not present

## 2016-10-18 ENCOUNTER — Ambulatory Visit: Payer: Medicare HMO

## 2016-10-19 DIAGNOSIS — M5136 Other intervertebral disc degeneration, lumbar region: Secondary | ICD-10-CM | POA: Diagnosis not present

## 2016-10-19 DIAGNOSIS — M5416 Radiculopathy, lumbar region: Secondary | ICD-10-CM | POA: Diagnosis not present

## 2016-10-28 DIAGNOSIS — R6889 Other general symptoms and signs: Secondary | ICD-10-CM | POA: Diagnosis not present

## 2016-11-01 DIAGNOSIS — R05 Cough: Secondary | ICD-10-CM | POA: Diagnosis not present

## 2016-11-01 DIAGNOSIS — R112 Nausea with vomiting, unspecified: Secondary | ICD-10-CM | POA: Diagnosis not present

## 2016-11-01 DIAGNOSIS — M791 Myalgia: Secondary | ICD-10-CM | POA: Diagnosis not present

## 2016-11-01 DIAGNOSIS — R079 Chest pain, unspecified: Secondary | ICD-10-CM | POA: Diagnosis not present

## 2016-11-01 DIAGNOSIS — R0600 Dyspnea, unspecified: Secondary | ICD-10-CM | POA: Diagnosis not present

## 2016-11-01 DIAGNOSIS — J029 Acute pharyngitis, unspecified: Secondary | ICD-10-CM | POA: Diagnosis not present

## 2016-11-01 DIAGNOSIS — I440 Atrioventricular block, first degree: Secondary | ICD-10-CM | POA: Diagnosis not present

## 2016-11-01 DIAGNOSIS — J441 Chronic obstructive pulmonary disease with (acute) exacerbation: Secondary | ICD-10-CM | POA: Diagnosis not present

## 2016-11-01 DIAGNOSIS — R509 Fever, unspecified: Secondary | ICD-10-CM | POA: Diagnosis not present

## 2016-11-01 DIAGNOSIS — J189 Pneumonia, unspecified organism: Secondary | ICD-10-CM | POA: Diagnosis not present

## 2016-11-01 DIAGNOSIS — Z87891 Personal history of nicotine dependence: Secondary | ICD-10-CM | POA: Diagnosis not present

## 2016-11-01 DIAGNOSIS — Z951 Presence of aortocoronary bypass graft: Secondary | ICD-10-CM | POA: Diagnosis not present

## 2016-11-01 DIAGNOSIS — I4510 Unspecified right bundle-branch block: Secondary | ICD-10-CM | POA: Diagnosis not present

## 2016-11-01 DIAGNOSIS — R9431 Abnormal electrocardiogram [ECG] [EKG]: Secondary | ICD-10-CM | POA: Diagnosis not present

## 2016-11-01 DIAGNOSIS — Z8614 Personal history of Methicillin resistant Staphylococcus aureus infection: Secondary | ICD-10-CM | POA: Diagnosis not present

## 2016-11-01 DIAGNOSIS — R197 Diarrhea, unspecified: Secondary | ICD-10-CM | POA: Diagnosis not present

## 2016-11-11 DIAGNOSIS — Z87898 Personal history of other specified conditions: Secondary | ICD-10-CM | POA: Diagnosis not present

## 2016-11-11 DIAGNOSIS — M545 Low back pain: Secondary | ICD-10-CM | POA: Diagnosis not present

## 2016-11-11 DIAGNOSIS — T40601S Poisoning by unspecified narcotics, accidental (unintentional), sequela: Secondary | ICD-10-CM | POA: Diagnosis not present

## 2016-11-11 DIAGNOSIS — F3340 Major depressive disorder, recurrent, in remission, unspecified: Secondary | ICD-10-CM | POA: Diagnosis not present

## 2016-11-11 DIAGNOSIS — Z79891 Long term (current) use of opiate analgesic: Secondary | ICD-10-CM | POA: Diagnosis not present

## 2016-11-11 DIAGNOSIS — G8929 Other chronic pain: Secondary | ICD-10-CM | POA: Diagnosis not present

## 2016-11-11 DIAGNOSIS — G4709 Other insomnia: Secondary | ICD-10-CM | POA: Diagnosis not present

## 2016-11-22 DIAGNOSIS — I110 Hypertensive heart disease with heart failure: Secondary | ICD-10-CM | POA: Diagnosis not present

## 2016-11-22 DIAGNOSIS — I2510 Atherosclerotic heart disease of native coronary artery without angina pectoris: Secondary | ICD-10-CM | POA: Diagnosis not present

## 2016-11-22 DIAGNOSIS — I25119 Atherosclerotic heart disease of native coronary artery with unspecified angina pectoris: Secondary | ICD-10-CM | POA: Diagnosis not present

## 2016-11-22 DIAGNOSIS — R0789 Other chest pain: Secondary | ICD-10-CM | POA: Diagnosis not present

## 2016-11-22 DIAGNOSIS — I252 Old myocardial infarction: Secondary | ICD-10-CM | POA: Diagnosis not present

## 2016-11-22 DIAGNOSIS — R079 Chest pain, unspecified: Secondary | ICD-10-CM | POA: Diagnosis not present

## 2016-11-22 DIAGNOSIS — F329 Major depressive disorder, single episode, unspecified: Secondary | ICD-10-CM | POA: Diagnosis not present

## 2016-11-22 DIAGNOSIS — I5033 Acute on chronic diastolic (congestive) heart failure: Secondary | ICD-10-CM | POA: Diagnosis not present

## 2016-11-22 DIAGNOSIS — J811 Chronic pulmonary edema: Secondary | ICD-10-CM | POA: Diagnosis not present

## 2016-11-22 DIAGNOSIS — G8929 Other chronic pain: Secondary | ICD-10-CM | POA: Diagnosis not present

## 2016-11-22 DIAGNOSIS — I440 Atrioventricular block, first degree: Secondary | ICD-10-CM | POA: Diagnosis not present

## 2016-11-22 DIAGNOSIS — I4510 Unspecified right bundle-branch block: Secondary | ICD-10-CM | POA: Diagnosis not present

## 2016-12-01 DIAGNOSIS — Z09 Encounter for follow-up examination after completed treatment for conditions other than malignant neoplasm: Secondary | ICD-10-CM | POA: Diagnosis not present

## 2016-12-01 DIAGNOSIS — I5033 Acute on chronic diastolic (congestive) heart failure: Secondary | ICD-10-CM | POA: Diagnosis not present

## 2016-12-01 DIAGNOSIS — J449 Chronic obstructive pulmonary disease, unspecified: Secondary | ICD-10-CM | POA: Diagnosis not present

## 2016-12-01 DIAGNOSIS — I10 Essential (primary) hypertension: Secondary | ICD-10-CM | POA: Diagnosis not present

## 2016-12-01 DIAGNOSIS — I2510 Atherosclerotic heart disease of native coronary artery without angina pectoris: Secondary | ICD-10-CM | POA: Diagnosis not present

## 2016-12-02 DIAGNOSIS — M25552 Pain in left hip: Secondary | ICD-10-CM | POA: Diagnosis not present

## 2016-12-02 DIAGNOSIS — M545 Low back pain: Secondary | ICD-10-CM | POA: Diagnosis not present

## 2016-12-02 DIAGNOSIS — I5033 Acute on chronic diastolic (congestive) heart failure: Secondary | ICD-10-CM | POA: Diagnosis not present

## 2016-12-02 DIAGNOSIS — I2510 Atherosclerotic heart disease of native coronary artery without angina pectoris: Secondary | ICD-10-CM | POA: Diagnosis not present

## 2016-12-02 DIAGNOSIS — Z79891 Long term (current) use of opiate analgesic: Secondary | ICD-10-CM | POA: Diagnosis not present

## 2016-12-02 DIAGNOSIS — G8928 Other chronic postprocedural pain: Secondary | ICD-10-CM | POA: Diagnosis not present

## 2016-12-02 DIAGNOSIS — G8929 Other chronic pain: Secondary | ICD-10-CM | POA: Diagnosis not present

## 2016-12-02 DIAGNOSIS — M25551 Pain in right hip: Secondary | ICD-10-CM | POA: Diagnosis not present

## 2016-12-06 DIAGNOSIS — R34 Anuria and oliguria: Secondary | ICD-10-CM | POA: Diagnosis not present

## 2016-12-06 DIAGNOSIS — I5033 Acute on chronic diastolic (congestive) heart failure: Secondary | ICD-10-CM | POA: Diagnosis not present

## 2016-12-06 DIAGNOSIS — R509 Fever, unspecified: Secondary | ICD-10-CM | POA: Diagnosis not present

## 2016-12-06 DIAGNOSIS — I2510 Atherosclerotic heart disease of native coronary artery without angina pectoris: Secondary | ICD-10-CM | POA: Diagnosis not present

## 2016-12-06 DIAGNOSIS — Z951 Presence of aortocoronary bypass graft: Secondary | ICD-10-CM | POA: Diagnosis not present

## 2016-12-06 DIAGNOSIS — M791 Myalgia: Secondary | ICD-10-CM | POA: Diagnosis not present

## 2016-12-06 DIAGNOSIS — R079 Chest pain, unspecified: Secondary | ICD-10-CM | POA: Diagnosis not present

## 2016-12-06 DIAGNOSIS — J189 Pneumonia, unspecified organism: Secondary | ICD-10-CM | POA: Diagnosis not present

## 2016-12-06 DIAGNOSIS — R69 Illness, unspecified: Secondary | ICD-10-CM | POA: Diagnosis not present

## 2016-12-06 DIAGNOSIS — K219 Gastro-esophageal reflux disease without esophagitis: Secondary | ICD-10-CM | POA: Diagnosis not present

## 2016-12-06 DIAGNOSIS — F339 Major depressive disorder, recurrent, unspecified: Secondary | ICD-10-CM | POA: Diagnosis not present

## 2016-12-06 DIAGNOSIS — J9851 Mediastinitis: Secondary | ICD-10-CM | POA: Diagnosis not present

## 2016-12-06 DIAGNOSIS — R918 Other nonspecific abnormal finding of lung field: Secondary | ICD-10-CM | POA: Diagnosis not present

## 2016-12-06 DIAGNOSIS — R5383 Other fatigue: Secondary | ICD-10-CM | POA: Diagnosis not present

## 2016-12-06 DIAGNOSIS — I5032 Chronic diastolic (congestive) heart failure: Secondary | ICD-10-CM | POA: Diagnosis not present

## 2016-12-06 DIAGNOSIS — I110 Hypertensive heart disease with heart failure: Secondary | ICD-10-CM | POA: Diagnosis not present

## 2016-12-06 DIAGNOSIS — I4892 Unspecified atrial flutter: Secondary | ICD-10-CM | POA: Diagnosis not present

## 2016-12-06 DIAGNOSIS — Z9889 Other specified postprocedural states: Secondary | ICD-10-CM | POA: Diagnosis not present

## 2016-12-06 DIAGNOSIS — L539 Erythematous condition, unspecified: Secondary | ICD-10-CM | POA: Diagnosis not present

## 2016-12-06 DIAGNOSIS — J440 Chronic obstructive pulmonary disease with acute lower respiratory infection: Secondary | ICD-10-CM | POA: Diagnosis not present

## 2016-12-14 ENCOUNTER — Other Ambulatory Visit: Payer: Self-pay

## 2016-12-14 NOTE — Patient Outreach (Signed)
[facility]) Care Management  12/14/2016  [name] Sr. 1956-07-20 [id number]  Transition of care  Week # 1 Referral date: 12/14/16 Referral source: [employer] transition of care status post hospital discharge from [facility] Program: pneumonia Insurance: [employer] Providers: Dr. Linward Foster, primary  MD, Dr. Nona Dell, cardiology Social support:  Patient currently residing with his son in [place], [place]   SUBJECTIVE: Telephone call to patient for transition of care.  HIPAA verified with patient.  Discussed transition of care program with patient. Patient states he was recently in the hospital for pneumonia, discharged on Friday, December 10, 2016. Patient states he has had a rough few months. Patient states he had a heart attack on February 5th 2018, had a triple bypass and contracted MRSA.  Patient states he was recently readmitted with pneumonia and discharged on December 10, 2016. Patient states he saw his primary MD approximately 1-2 weeks ago. Patient states he has a follow up appointment with his primary MD tomorrow 12/15/16 Patient reports he is living by himself but since he has been sick he has had to stay with his son.  Patient states his son helps him with transportation to his appointments or he uses [employer] transportation. Patient states he has all of his medications and takes them as prescribed.  Patient states he son sets up his pill box.  RNCM asked to review medications with patient. Patient states he did not have a list with him at time of this call. RNCM requested patient have medication list at next call with RNCM. Patient verbalized understanding.  Patient states he he knows what doctor to contact if he has problems. Patient denies fever, shortness of breath, or chest discomfort. Patient states he is producing phlegm when he coughs. Patient states the doctor told him this would happen because the medication he is on is helping to break up the cold in  his chest.  Patient reports he has heart failure and COPD. Patient states he does not weigh everyday. Patient reports he has not seen his pulmonologist in a while.   RNCM discussed with patient importance of weighing and recording weights daily RNCM reviewed signs/ symptoms of heart failure and copd.  Patient advised to contact his doctor for symptoms and call 911 for severe symptoms. RNCM discussed with patient importance of have frequent follow up visit with his pulmonologist and cardiologist.  Advised that appointment should be made.    PLAN: RNCM will follow up with patient within 1 week. Patient will have medication list to review with RNCM at next outreach. RNCM will send patient [facility] care management packet, consent form and welcome letter. RNCm will send patient EMMI education material regarding pneumonia, COPD, and heart failure.  RNCM will send patient's primary MD involvement letter.   George Ina RN,BSN,CCM [facility] Telephonic  [phone]

## 2016-12-15 DIAGNOSIS — K5909 Other constipation: Secondary | ICD-10-CM | POA: Diagnosis not present

## 2016-12-15 DIAGNOSIS — G8929 Other chronic pain: Secondary | ICD-10-CM | POA: Diagnosis not present

## 2016-12-15 DIAGNOSIS — J189 Pneumonia, unspecified organism: Secondary | ICD-10-CM | POA: Diagnosis not present

## 2016-12-15 DIAGNOSIS — G8928 Other chronic postprocedural pain: Secondary | ICD-10-CM | POA: Diagnosis not present

## 2016-12-21 ENCOUNTER — Other Ambulatory Visit: Payer: Self-pay

## 2016-12-21 NOTE — Patient Outreach (Signed)
[facility]) Care Management  12/21/2016  [name] VANROOYEN Sr. [birth year] [id number]   Transition of care  Week # 2 Referral date: 12/14/16 Referral source: [employer] transition of care status post hospital discharge from [facility] Program: pneumonia Insurance: [employer] Providers: Dr. Linward Foster, primary  MD, Dr. Nona Dell, cardiology Social support:  Patient currently residing with his son in [place], [place]  Telephone call to patient for transition of care follow up. HIPAA verified with patient. Patient request call back another day.  Patient states he is not feeling the best today.   RNCM will attempt 2nd telephone outreach within 1 week.  George Ina RN,BSN,CCM [facility] Telephonic  [phone]

## 2016-12-22 DIAGNOSIS — G8929 Other chronic pain: Secondary | ICD-10-CM | POA: Diagnosis not present

## 2016-12-22 DIAGNOSIS — M25562 Pain in left knee: Secondary | ICD-10-CM | POA: Diagnosis not present

## 2016-12-22 DIAGNOSIS — M25561 Pain in right knee: Secondary | ICD-10-CM | POA: Diagnosis not present

## 2016-12-22 DIAGNOSIS — M25551 Pain in right hip: Secondary | ICD-10-CM | POA: Diagnosis not present

## 2016-12-22 DIAGNOSIS — M25552 Pain in left hip: Secondary | ICD-10-CM | POA: Diagnosis not present

## 2016-12-22 DIAGNOSIS — R42 Dizziness and giddiness: Secondary | ICD-10-CM | POA: Diagnosis not present

## 2016-12-22 DIAGNOSIS — M545 Low back pain: Secondary | ICD-10-CM | POA: Diagnosis not present

## 2016-12-23 ENCOUNTER — Other Ambulatory Visit: Payer: Self-pay

## 2016-12-23 NOTE — Patient Outreach (Signed)
[facility]) Care Management  12/23/2016  [name] WENMAN Sr. [birth year] [id number]  Transition of care  Week # 3 Referral date: 12/14/16 Referral source: [employer] transition of care status post hospital discharge from [facility] Program: pneumonia Insurance: [employer] Providers: Dr. Linward Foster, primary MD, Dr. Nona Dell, cardiology Social support: Patient currently residing with his son in [place], [place] Outreach attempt #2  Telephone call to patient for transition of care follow up. Unable to reach patient. HIPAA compliant voice message left with call back phone number  RNCM will attempt 3rd telephone outreach within 1 week.  George Ina RN,BSN,CCM [facility] Telephonic  [phone]

## 2016-12-27 ENCOUNTER — Other Ambulatory Visit: Payer: Self-pay

## 2016-12-27 NOTE — Patient Outreach (Signed)
[facility]) Care Management  12/27/2016  [name] ARAGONEZ Sr. Dec 30, 1955 [id number]  Transition of care  Week # 1 Referral date: 12/14/16 Referral source: [employer] transition of care status post hospital discharge from [facility] Program: pneumonia Insurance: [employer] Providers: Dr. Linward Foster, primary  MD, Dr. Nona Dell, cardiology Social support:  Patient currently residing with his son in [place], [place] Outreach attempt #3  Third telephone call to patient regarding transition of care follow up. Unable to reach patient. HIPAA compliant voice message left with call back phone number.   PLAN:  RNCM will send patient outreach letter to attempt contact.   George Ina RN,BSN,CCM [facility] Telephonic  [phone]

## 2016-12-28 DIAGNOSIS — R509 Fever, unspecified: Secondary | ICD-10-CM | POA: Diagnosis not present

## 2016-12-28 DIAGNOSIS — R918 Other nonspecific abnormal finding of lung field: Secondary | ICD-10-CM | POA: Diagnosis not present

## 2016-12-28 DIAGNOSIS — I5033 Acute on chronic diastolic (congestive) heart failure: Secondary | ICD-10-CM | POA: Diagnosis not present

## 2016-12-28 DIAGNOSIS — R0602 Shortness of breath: Secondary | ICD-10-CM | POA: Diagnosis not present

## 2016-12-28 DIAGNOSIS — F411 Generalized anxiety disorder: Secondary | ICD-10-CM | POA: Diagnosis not present

## 2016-12-28 DIAGNOSIS — R6889 Other general symptoms and signs: Secondary | ICD-10-CM | POA: Diagnosis not present

## 2016-12-28 DIAGNOSIS — R05 Cough: Secondary | ICD-10-CM | POA: Diagnosis not present

## 2016-12-28 DIAGNOSIS — D72829 Elevated white blood cell count, unspecified: Secondary | ICD-10-CM | POA: Diagnosis not present

## 2016-12-31 DIAGNOSIS — I4581 Long QT syndrome: Secondary | ICD-10-CM | POA: Diagnosis not present

## 2016-12-31 DIAGNOSIS — R609 Edema, unspecified: Secondary | ICD-10-CM | POA: Diagnosis not present

## 2016-12-31 DIAGNOSIS — Z5181 Encounter for therapeutic drug level monitoring: Secondary | ICD-10-CM | POA: Diagnosis not present

## 2016-12-31 DIAGNOSIS — R079 Chest pain, unspecified: Secondary | ICD-10-CM | POA: Diagnosis not present

## 2016-12-31 DIAGNOSIS — I440 Atrioventricular block, first degree: Secondary | ICD-10-CM | POA: Diagnosis not present

## 2016-12-31 DIAGNOSIS — R1900 Intra-abdominal and pelvic swelling, mass and lump, unspecified site: Secondary | ICD-10-CM | POA: Diagnosis not present

## 2016-12-31 DIAGNOSIS — R0602 Shortness of breath: Secondary | ICD-10-CM | POA: Diagnosis not present

## 2016-12-31 DIAGNOSIS — R911 Solitary pulmonary nodule: Secondary | ICD-10-CM | POA: Diagnosis not present

## 2016-12-31 DIAGNOSIS — R0789 Other chest pain: Secondary | ICD-10-CM | POA: Diagnosis not present

## 2016-12-31 DIAGNOSIS — J441 Chronic obstructive pulmonary disease with (acute) exacerbation: Secondary | ICD-10-CM | POA: Diagnosis not present

## 2016-12-31 DIAGNOSIS — I4510 Unspecified right bundle-branch block: Secondary | ICD-10-CM | POA: Diagnosis not present

## 2016-12-31 DIAGNOSIS — E669 Obesity, unspecified: Secondary | ICD-10-CM | POA: Diagnosis not present

## 2016-12-31 DIAGNOSIS — G8929 Other chronic pain: Secondary | ICD-10-CM | POA: Diagnosis not present

## 2016-12-31 DIAGNOSIS — R918 Other nonspecific abnormal finding of lung field: Secondary | ICD-10-CM | POA: Diagnosis not present

## 2017-01-03 DIAGNOSIS — J441 Chronic obstructive pulmonary disease with (acute) exacerbation: Secondary | ICD-10-CM | POA: Diagnosis not present

## 2017-01-03 DIAGNOSIS — R0602 Shortness of breath: Secondary | ICD-10-CM | POA: Diagnosis not present

## 2017-01-03 DIAGNOSIS — J449 Chronic obstructive pulmonary disease, unspecified: Secondary | ICD-10-CM | POA: Diagnosis not present

## 2017-01-03 DIAGNOSIS — I25119 Atherosclerotic heart disease of native coronary artery with unspecified angina pectoris: Secondary | ICD-10-CM | POA: Diagnosis not present

## 2017-01-03 DIAGNOSIS — E7800 Pure hypercholesterolemia, unspecified: Secondary | ICD-10-CM | POA: Diagnosis not present

## 2017-01-03 DIAGNOSIS — E785 Hyperlipidemia, unspecified: Secondary | ICD-10-CM | POA: Diagnosis not present

## 2017-01-03 DIAGNOSIS — Q245 Malformation of coronary vessels: Secondary | ICD-10-CM | POA: Diagnosis not present

## 2017-01-03 DIAGNOSIS — D509 Iron deficiency anemia, unspecified: Secondary | ICD-10-CM | POA: Diagnosis not present

## 2017-01-03 DIAGNOSIS — I10 Essential (primary) hypertension: Secondary | ICD-10-CM | POA: Diagnosis not present

## 2017-01-03 DIAGNOSIS — I444 Left anterior fascicular block: Secondary | ICD-10-CM | POA: Diagnosis not present

## 2017-01-03 DIAGNOSIS — F411 Generalized anxiety disorder: Secondary | ICD-10-CM | POA: Diagnosis not present

## 2017-01-03 DIAGNOSIS — I25110 Atherosclerotic heart disease of native coronary artery with unstable angina pectoris: Secondary | ICD-10-CM | POA: Diagnosis not present

## 2017-01-03 DIAGNOSIS — I440 Atrioventricular block, first degree: Secondary | ICD-10-CM | POA: Diagnosis not present

## 2017-01-03 DIAGNOSIS — R079 Chest pain, unspecified: Secondary | ICD-10-CM | POA: Diagnosis not present

## 2017-01-04 DIAGNOSIS — I25110 Atherosclerotic heart disease of native coronary artery with unstable angina pectoris: Secondary | ICD-10-CM | POA: Diagnosis not present

## 2017-01-04 DIAGNOSIS — I10 Essential (primary) hypertension: Secondary | ICD-10-CM | POA: Diagnosis not present

## 2017-01-04 DIAGNOSIS — I25119 Atherosclerotic heart disease of native coronary artery with unspecified angina pectoris: Secondary | ICD-10-CM | POA: Diagnosis not present

## 2017-01-04 DIAGNOSIS — G4733 Obstructive sleep apnea (adult) (pediatric): Secondary | ICD-10-CM | POA: Diagnosis not present

## 2017-01-04 DIAGNOSIS — Z951 Presence of aortocoronary bypass graft: Secondary | ICD-10-CM | POA: Diagnosis not present

## 2017-01-04 DIAGNOSIS — Q245 Malformation of coronary vessels: Secondary | ICD-10-CM | POA: Diagnosis not present

## 2017-01-04 DIAGNOSIS — R079 Chest pain, unspecified: Secondary | ICD-10-CM | POA: Diagnosis not present

## 2017-01-04 DIAGNOSIS — F411 Generalized anxiety disorder: Secondary | ICD-10-CM | POA: Diagnosis not present

## 2017-01-04 DIAGNOSIS — J441 Chronic obstructive pulmonary disease with (acute) exacerbation: Secondary | ICD-10-CM | POA: Diagnosis not present

## 2017-01-05 DIAGNOSIS — Q245 Malformation of coronary vessels: Secondary | ICD-10-CM | POA: Diagnosis not present

## 2017-01-05 DIAGNOSIS — I10 Essential (primary) hypertension: Secondary | ICD-10-CM | POA: Diagnosis not present

## 2017-01-05 DIAGNOSIS — F411 Generalized anxiety disorder: Secondary | ICD-10-CM | POA: Diagnosis not present

## 2017-01-05 DIAGNOSIS — J441 Chronic obstructive pulmonary disease with (acute) exacerbation: Secondary | ICD-10-CM | POA: Diagnosis not present

## 2017-01-05 DIAGNOSIS — I25119 Atherosclerotic heart disease of native coronary artery with unspecified angina pectoris: Secondary | ICD-10-CM | POA: Diagnosis not present

## 2017-01-05 DIAGNOSIS — R079 Chest pain, unspecified: Secondary | ICD-10-CM | POA: Diagnosis not present

## 2017-01-05 DIAGNOSIS — G4733 Obstructive sleep apnea (adult) (pediatric): Secondary | ICD-10-CM | POA: Diagnosis not present

## 2017-01-06 DIAGNOSIS — M150 Primary generalized (osteo)arthritis: Secondary | ICD-10-CM | POA: Diagnosis not present

## 2017-01-06 DIAGNOSIS — R0609 Other forms of dyspnea: Secondary | ICD-10-CM | POA: Diagnosis not present

## 2017-01-06 DIAGNOSIS — Z09 Encounter for follow-up examination after completed treatment for conditions other than malignant neoplasm: Secondary | ICD-10-CM | POA: Diagnosis not present

## 2017-01-06 DIAGNOSIS — Z6841 Body Mass Index (BMI) 40.0 and over, adult: Secondary | ICD-10-CM | POA: Diagnosis not present

## 2017-01-06 DIAGNOSIS — I5033 Acute on chronic diastolic (congestive) heart failure: Secondary | ICD-10-CM | POA: Diagnosis not present

## 2017-01-06 DIAGNOSIS — G8929 Other chronic pain: Secondary | ICD-10-CM | POA: Diagnosis not present

## 2017-01-10 ENCOUNTER — Other Ambulatory Visit: Payer: Self-pay

## 2017-01-10 DIAGNOSIS — I2510 Atherosclerotic heart disease of native coronary artery without angina pectoris: Secondary | ICD-10-CM | POA: Diagnosis not present

## 2017-01-10 DIAGNOSIS — I10 Essential (primary) hypertension: Secondary | ICD-10-CM | POA: Diagnosis not present

## 2017-01-10 DIAGNOSIS — J441 Chronic obstructive pulmonary disease with (acute) exacerbation: Secondary | ICD-10-CM | POA: Diagnosis not present

## 2017-01-10 DIAGNOSIS — F329 Major depressive disorder, single episode, unspecified: Secondary | ICD-10-CM | POA: Diagnosis not present

## 2017-01-10 DIAGNOSIS — M1991 Primary osteoarthritis, unspecified site: Secondary | ICD-10-CM | POA: Diagnosis not present

## 2017-01-10 DIAGNOSIS — F411 Generalized anxiety disorder: Secondary | ICD-10-CM | POA: Diagnosis not present

## 2017-01-10 NOTE — Patient Outreach (Signed)
[facility]) Care Management  01/10/2017  [name] COLQUHOUN Sr. Jan 18, 1956 [id number]   Case closure Transition of care  Referral date: 12/14/16 Referral source: [employer] transition of care status post hospital discharge from [facility] Program: pneumonia Insurance: [employer] Providers: Dr. Linward Foster, primary MD, Dr. Nona Dell, cardiology Social support: Patient currently residing with his son in [place], [place]  No response from patient after 3 telephone calls and outreach letter attempt. PLAN; RNCM will refer patient to care management assistant to close: Not able to contact patient. RNCM will notify patients primary  MD of closure.  RNCM will send patient closure letter.  George Ina RN,BSN,CCM [facility] Telephonic  [phone]

## 2017-01-13 DIAGNOSIS — J441 Chronic obstructive pulmonary disease with (acute) exacerbation: Secondary | ICD-10-CM | POA: Diagnosis not present

## 2017-01-13 DIAGNOSIS — I2510 Atherosclerotic heart disease of native coronary artery without angina pectoris: Secondary | ICD-10-CM | POA: Diagnosis not present

## 2017-01-13 DIAGNOSIS — F329 Major depressive disorder, single episode, unspecified: Secondary | ICD-10-CM | POA: Diagnosis not present

## 2017-01-13 DIAGNOSIS — F411 Generalized anxiety disorder: Secondary | ICD-10-CM | POA: Diagnosis not present

## 2017-01-13 DIAGNOSIS — M1991 Primary osteoarthritis, unspecified site: Secondary | ICD-10-CM | POA: Diagnosis not present

## 2017-01-13 DIAGNOSIS — I10 Essential (primary) hypertension: Secondary | ICD-10-CM | POA: Diagnosis not present

## 2017-01-14 DIAGNOSIS — J441 Chronic obstructive pulmonary disease with (acute) exacerbation: Secondary | ICD-10-CM | POA: Diagnosis not present

## 2017-01-14 DIAGNOSIS — F329 Major depressive disorder, single episode, unspecified: Secondary | ICD-10-CM | POA: Diagnosis not present

## 2017-01-14 DIAGNOSIS — F411 Generalized anxiety disorder: Secondary | ICD-10-CM | POA: Diagnosis not present

## 2017-01-14 DIAGNOSIS — M1991 Primary osteoarthritis, unspecified site: Secondary | ICD-10-CM | POA: Diagnosis not present

## 2017-01-14 DIAGNOSIS — I10 Essential (primary) hypertension: Secondary | ICD-10-CM | POA: Diagnosis not present

## 2017-01-14 DIAGNOSIS — I2510 Atherosclerotic heart disease of native coronary artery without angina pectoris: Secondary | ICD-10-CM | POA: Diagnosis not present

## 2017-01-18 ENCOUNTER — Other Ambulatory Visit: Payer: Self-pay

## 2017-01-18 DIAGNOSIS — G4733 Obstructive sleep apnea (adult) (pediatric): Secondary | ICD-10-CM | POA: Diagnosis not present

## 2017-01-18 DIAGNOSIS — R1110 Vomiting, unspecified: Secondary | ICD-10-CM | POA: Diagnosis not present

## 2017-01-18 DIAGNOSIS — R918 Other nonspecific abnormal finding of lung field: Secondary | ICD-10-CM | POA: Diagnosis not present

## 2017-01-18 DIAGNOSIS — G8918 Other acute postprocedural pain: Secondary | ICD-10-CM | POA: Diagnosis not present

## 2017-01-18 DIAGNOSIS — Z87898 Personal history of other specified conditions: Secondary | ICD-10-CM | POA: Diagnosis not present

## 2017-01-18 DIAGNOSIS — T814XXA Infection following a procedure, initial encounter: Secondary | ICD-10-CM | POA: Diagnosis not present

## 2017-01-18 DIAGNOSIS — F339 Major depressive disorder, recurrent, unspecified: Secondary | ICD-10-CM | POA: Diagnosis not present

## 2017-01-18 DIAGNOSIS — E785 Hyperlipidemia, unspecified: Secondary | ICD-10-CM | POA: Diagnosis not present

## 2017-01-18 DIAGNOSIS — J9811 Atelectasis: Secondary | ICD-10-CM | POA: Diagnosis not present

## 2017-01-18 DIAGNOSIS — J860 Pyothorax with fistula: Secondary | ICD-10-CM | POA: Diagnosis not present

## 2017-01-18 DIAGNOSIS — I5032 Chronic diastolic (congestive) heart failure: Secondary | ICD-10-CM | POA: Diagnosis not present

## 2017-01-18 DIAGNOSIS — I440 Atrioventricular block, first degree: Secondary | ICD-10-CM | POA: Diagnosis not present

## 2017-01-18 DIAGNOSIS — Z6841 Body Mass Index (BMI) 40.0 and over, adult: Secondary | ICD-10-CM | POA: Diagnosis not present

## 2017-01-18 DIAGNOSIS — T8189XA Other complications of procedures, not elsewhere classified, initial encounter: Secondary | ICD-10-CM | POA: Diagnosis not present

## 2017-01-18 DIAGNOSIS — B9562 Methicillin resistant Staphylococcus aureus infection as the cause of diseases classified elsewhere: Secondary | ICD-10-CM | POA: Diagnosis not present

## 2017-01-18 DIAGNOSIS — J9851 Mediastinitis: Secondary | ICD-10-CM | POA: Diagnosis not present

## 2017-01-18 DIAGNOSIS — Z01818 Encounter for other preprocedural examination: Secondary | ICD-10-CM | POA: Diagnosis not present

## 2017-01-18 DIAGNOSIS — J811 Chronic pulmonary edema: Secondary | ICD-10-CM | POA: Diagnosis not present

## 2017-01-18 DIAGNOSIS — Z951 Presence of aortocoronary bypass graft: Secondary | ICD-10-CM | POA: Diagnosis not present

## 2017-01-18 NOTE — Patient Outreach (Signed)
[facility]) Care Management  01/18/2017  [name] CALLERY Sr. 1955/11/03 [id number]  Transition of care return call from patient  Referral date: 12/14/16 Referral source: [employer] transition of care status post hospital discharge from [facility] Program: pneumonia Insurance: [employer] Providers: Dr. Linward Foster, primary  MD, Dr. Nona Dell, cardiology Social support:  Patient currently residing with his son in [place], [place]   Returned call to patient as requested. HIPAA verified with patient. Patient states he is going to have surgery today. Patient states he still wants telephone phone follow up by [facility] care management.  Patient states he will not be able to talk with this RNCM for at least a week or so due to having surgery. Patient request call back at a later time.   PLAN: RNCM will attempt 2nd telephone outreach to patient within 2 weeks.   George Ina RN,BSN,CCM [facility] Telephonic  [phone]

## 2017-01-25 DIAGNOSIS — Z5181 Encounter for therapeutic drug level monitoring: Secondary | ICD-10-CM | POA: Diagnosis not present

## 2017-01-25 DIAGNOSIS — R52 Pain, unspecified: Secondary | ICD-10-CM | POA: Diagnosis not present

## 2017-01-25 DIAGNOSIS — J811 Chronic pulmonary edema: Secondary | ICD-10-CM | POA: Diagnosis not present

## 2017-01-25 DIAGNOSIS — R531 Weakness: Secondary | ICD-10-CM | POA: Diagnosis not present

## 2017-01-25 DIAGNOSIS — R42 Dizziness and giddiness: Secondary | ICD-10-CM | POA: Diagnosis not present

## 2017-01-25 DIAGNOSIS — D72829 Elevated white blood cell count, unspecified: Secondary | ICD-10-CM | POA: Diagnosis not present

## 2017-01-25 DIAGNOSIS — Z951 Presence of aortocoronary bypass graft: Secondary | ICD-10-CM | POA: Diagnosis not present

## 2017-01-25 DIAGNOSIS — R0789 Other chest pain: Secondary | ICD-10-CM | POA: Diagnosis not present

## 2017-01-25 DIAGNOSIS — R5383 Other fatigue: Secondary | ICD-10-CM | POA: Diagnosis not present

## 2017-01-25 DIAGNOSIS — J9811 Atelectasis: Secondary | ICD-10-CM | POA: Diagnosis not present

## 2017-01-26 ENCOUNTER — Other Ambulatory Visit: Payer: Self-pay

## 2017-01-26 DIAGNOSIS — F411 Generalized anxiety disorder: Secondary | ICD-10-CM | POA: Diagnosis not present

## 2017-01-26 DIAGNOSIS — Z09 Encounter for follow-up examination after completed treatment for conditions other than malignant neoplasm: Secondary | ICD-10-CM | POA: Diagnosis not present

## 2017-01-26 DIAGNOSIS — F410 Panic disorder [episodic paroxysmal anxiety] without agoraphobia: Secondary | ICD-10-CM | POA: Diagnosis not present

## 2017-01-26 DIAGNOSIS — M545 Low back pain: Secondary | ICD-10-CM | POA: Diagnosis not present

## 2017-01-26 DIAGNOSIS — G8929 Other chronic pain: Secondary | ICD-10-CM | POA: Diagnosis not present

## 2017-01-26 DIAGNOSIS — R7989 Other specified abnormal findings of blood chemistry: Secondary | ICD-10-CM | POA: Diagnosis not present

## 2017-01-26 DIAGNOSIS — J9851 Mediastinitis: Secondary | ICD-10-CM | POA: Diagnosis not present

## 2017-01-26 NOTE — Telephone Encounter (Signed)
This encounter was created in error - please disregard.

## 2017-01-26 NOTE — Patient Outreach (Signed)
[facility]) Care Management  01/26/2017  [name]. 01/08/1956 [id number]  Referral date: 12/14/16 Referral source: [employer] transition of care status post hospital discharge from [facility] Program: pneumonia Insurance: [employer] Providers: Dr. Linward Foster, primary MD, Dr. Nona Dell, cardiology Social support: Patient currently residing with his son in [place], [place]  Telephone call to patient regarding transition of care follow up. Unable to reach patient.  Call forwarded to patients voice mail. HIPAA compliant voice message left with call back phone number.   PLAN; RNCM will attempt 2nd telephone outreach to patient within 1 week.   George Ina RN,BSN,CCM [facility] Telephonic  [phone]

## 2017-01-27 DIAGNOSIS — I2510 Atherosclerotic heart disease of native coronary artery without angina pectoris: Secondary | ICD-10-CM | POA: Diagnosis not present

## 2017-01-27 DIAGNOSIS — J441 Chronic obstructive pulmonary disease with (acute) exacerbation: Secondary | ICD-10-CM | POA: Diagnosis not present

## 2017-01-27 DIAGNOSIS — F411 Generalized anxiety disorder: Secondary | ICD-10-CM | POA: Diagnosis not present

## 2017-01-27 DIAGNOSIS — F329 Major depressive disorder, single episode, unspecified: Secondary | ICD-10-CM | POA: Diagnosis not present

## 2017-01-27 DIAGNOSIS — M1991 Primary osteoarthritis, unspecified site: Secondary | ICD-10-CM | POA: Diagnosis not present

## 2017-01-27 DIAGNOSIS — I10 Essential (primary) hypertension: Secondary | ICD-10-CM | POA: Diagnosis not present

## 2017-01-28 ENCOUNTER — Other Ambulatory Visit: Payer: Self-pay

## 2017-01-28 NOTE — Patient Outreach (Signed)
[facility]) Care Management  01/28/2017  VERNOR ABEND Sr. Dec 20, 1955 [id number]   Referral date: 12/14/16 Referral source: [employer] transition of care status post hospital discharge from [facility] Program: pneumonia Insurance: [employer] Providers: Dr. Linward Foster, primary MD, Dr. Nona Dell, cardiology Social support: Patient currently residing with his son in [place], [place]  Telephone call to patient. Unable to reach. HIPAA compliant voice message left with call back phone number.   PLAN:  RNCM will attempt to contact patient within 1 week.   George Ina RN,BSN,CCM [facility] Telephonic  [phone]

## 2017-01-29 DIAGNOSIS — R0789 Other chest pain: Secondary | ICD-10-CM | POA: Diagnosis not present

## 2017-01-29 DIAGNOSIS — R5383 Other fatigue: Secondary | ICD-10-CM | POA: Diagnosis not present

## 2017-01-29 DIAGNOSIS — Z8614 Personal history of Methicillin resistant Staphylococcus aureus infection: Secondary | ICD-10-CM | POA: Diagnosis not present

## 2017-01-29 DIAGNOSIS — Z951 Presence of aortocoronary bypass graft: Secondary | ICD-10-CM | POA: Diagnosis not present

## 2017-01-29 DIAGNOSIS — Z5181 Encounter for therapeutic drug level monitoring: Secondary | ICD-10-CM | POA: Diagnosis not present

## 2017-01-29 DIAGNOSIS — R079 Chest pain, unspecified: Secondary | ICD-10-CM | POA: Diagnosis not present

## 2017-01-29 DIAGNOSIS — Z9889 Other specified postprocedural states: Secondary | ICD-10-CM | POA: Diagnosis not present

## 2017-01-29 DIAGNOSIS — R531 Weakness: Secondary | ICD-10-CM | POA: Diagnosis not present

## 2017-01-29 DIAGNOSIS — Z87891 Personal history of nicotine dependence: Secondary | ICD-10-CM | POA: Diagnosis not present

## 2017-01-29 DIAGNOSIS — R0600 Dyspnea, unspecified: Secondary | ICD-10-CM | POA: Diagnosis not present

## 2017-01-29 DIAGNOSIS — R0602 Shortness of breath: Secondary | ICD-10-CM | POA: Diagnosis not present

## 2017-02-01 DIAGNOSIS — R918 Other nonspecific abnormal finding of lung field: Secondary | ICD-10-CM | POA: Diagnosis not present

## 2017-02-01 DIAGNOSIS — J9851 Mediastinitis: Secondary | ICD-10-CM | POA: Diagnosis not present

## 2017-02-01 DIAGNOSIS — Z9889 Other specified postprocedural states: Secondary | ICD-10-CM | POA: Diagnosis not present

## 2017-02-01 DIAGNOSIS — Z9181 History of falling: Secondary | ICD-10-CM | POA: Diagnosis not present

## 2017-02-01 DIAGNOSIS — R2991 Unspecified symptoms and signs involving the musculoskeletal system: Secondary | ICD-10-CM | POA: Diagnosis not present

## 2017-02-01 DIAGNOSIS — I110 Hypertensive heart disease with heart failure: Secondary | ICD-10-CM | POA: Diagnosis not present

## 2017-02-01 DIAGNOSIS — T814XXA Infection following a procedure, initial encounter: Secondary | ICD-10-CM | POA: Diagnosis not present

## 2017-02-01 DIAGNOSIS — I2510 Atherosclerotic heart disease of native coronary artery without angina pectoris: Secondary | ICD-10-CM | POA: Diagnosis not present

## 2017-02-01 DIAGNOSIS — R7881 Bacteremia: Secondary | ICD-10-CM | POA: Diagnosis not present

## 2017-02-01 DIAGNOSIS — D509 Iron deficiency anemia, unspecified: Secondary | ICD-10-CM | POA: Diagnosis not present

## 2017-02-01 DIAGNOSIS — I509 Heart failure, unspecified: Secondary | ICD-10-CM | POA: Diagnosis not present

## 2017-02-01 DIAGNOSIS — J441 Chronic obstructive pulmonary disease with (acute) exacerbation: Secondary | ICD-10-CM | POA: Diagnosis not present

## 2017-02-01 DIAGNOSIS — I4891 Unspecified atrial fibrillation: Secondary | ICD-10-CM | POA: Diagnosis not present

## 2017-02-01 DIAGNOSIS — Z6841 Body Mass Index (BMI) 40.0 and over, adult: Secondary | ICD-10-CM | POA: Diagnosis not present

## 2017-02-01 DIAGNOSIS — I10 Essential (primary) hypertension: Secondary | ICD-10-CM | POA: Diagnosis not present

## 2017-02-01 DIAGNOSIS — Z951 Presence of aortocoronary bypass graft: Secondary | ICD-10-CM | POA: Diagnosis not present

## 2017-02-01 DIAGNOSIS — J449 Chronic obstructive pulmonary disease, unspecified: Secondary | ICD-10-CM | POA: Diagnosis not present

## 2017-02-01 DIAGNOSIS — E43 Unspecified severe protein-calorie malnutrition: Secondary | ICD-10-CM | POA: Diagnosis not present

## 2017-02-01 DIAGNOSIS — M6250 Muscle wasting and atrophy, not elsewhere classified, unspecified site: Secondary | ICD-10-CM | POA: Diagnosis not present

## 2017-02-01 DIAGNOSIS — G8929 Other chronic pain: Secondary | ICD-10-CM | POA: Diagnosis not present

## 2017-02-01 DIAGNOSIS — T8189XA Other complications of procedures, not elsewhere classified, initial encounter: Secondary | ICD-10-CM | POA: Diagnosis not present

## 2017-02-01 DIAGNOSIS — L7634 Postprocedural seroma of skin and subcutaneous tissue following other procedure: Secondary | ICD-10-CM | POA: Diagnosis not present

## 2017-02-01 DIAGNOSIS — R0789 Other chest pain: Secondary | ICD-10-CM | POA: Diagnosis not present

## 2017-02-01 DIAGNOSIS — T50901A Poisoning by unspecified drugs, medicaments and biological substances, accidental (unintentional), initial encounter: Secondary | ICD-10-CM | POA: Diagnosis not present

## 2017-02-01 DIAGNOSIS — L02213 Cutaneous abscess of chest wall: Secondary | ICD-10-CM | POA: Diagnosis not present

## 2017-02-01 DIAGNOSIS — F339 Major depressive disorder, recurrent, unspecified: Secondary | ICD-10-CM | POA: Diagnosis not present

## 2017-02-01 DIAGNOSIS — M6281 Muscle weakness (generalized): Secondary | ICD-10-CM | POA: Diagnosis not present

## 2017-02-01 DIAGNOSIS — R509 Fever, unspecified: Secondary | ICD-10-CM | POA: Diagnosis not present

## 2017-02-01 DIAGNOSIS — R531 Weakness: Secondary | ICD-10-CM | POA: Diagnosis not present

## 2017-02-01 DIAGNOSIS — T814XXD Infection following a procedure, subsequent encounter: Secondary | ICD-10-CM | POA: Diagnosis not present

## 2017-02-01 DIAGNOSIS — I5032 Chronic diastolic (congestive) heart failure: Secondary | ICD-10-CM | POA: Diagnosis not present

## 2017-02-17 DIAGNOSIS — G8929 Other chronic pain: Secondary | ICD-10-CM | POA: Diagnosis not present

## 2017-02-17 DIAGNOSIS — F339 Major depressive disorder, recurrent, unspecified: Secondary | ICD-10-CM | POA: Diagnosis not present

## 2017-02-17 DIAGNOSIS — M6250 Muscle wasting and atrophy, not elsewhere classified, unspecified site: Secondary | ICD-10-CM | POA: Diagnosis not present

## 2017-02-17 DIAGNOSIS — T814XXD Infection following a procedure, subsequent encounter: Secondary | ICD-10-CM | POA: Diagnosis not present

## 2017-02-17 DIAGNOSIS — I2510 Atherosclerotic heart disease of native coronary artery without angina pectoris: Secondary | ICD-10-CM | POA: Diagnosis not present

## 2017-02-17 DIAGNOSIS — R2991 Unspecified symptoms and signs involving the musculoskeletal system: Secondary | ICD-10-CM | POA: Diagnosis not present

## 2017-02-17 DIAGNOSIS — I10 Essential (primary) hypertension: Secondary | ICD-10-CM | POA: Diagnosis not present

## 2017-02-17 DIAGNOSIS — I509 Heart failure, unspecified: Secondary | ICD-10-CM | POA: Diagnosis not present

## 2017-02-17 DIAGNOSIS — E43 Unspecified severe protein-calorie malnutrition: Secondary | ICD-10-CM | POA: Diagnosis not present

## 2017-02-17 DIAGNOSIS — R531 Weakness: Secondary | ICD-10-CM | POA: Diagnosis not present

## 2017-02-17 DIAGNOSIS — Z9181 History of falling: Secondary | ICD-10-CM | POA: Diagnosis not present

## 2017-02-17 DIAGNOSIS — R7881 Bacteremia: Secondary | ICD-10-CM | POA: Diagnosis not present

## 2017-02-17 DIAGNOSIS — I4891 Unspecified atrial fibrillation: Secondary | ICD-10-CM | POA: Diagnosis not present

## 2017-02-17 DIAGNOSIS — A4902 Methicillin resistant Staphylococcus aureus infection, unspecified site: Secondary | ICD-10-CM | POA: Diagnosis not present

## 2017-02-17 DIAGNOSIS — J9851 Mediastinitis: Secondary | ICD-10-CM | POA: Diagnosis not present

## 2017-02-17 DIAGNOSIS — D509 Iron deficiency anemia, unspecified: Secondary | ICD-10-CM | POA: Diagnosis not present

## 2017-02-17 DIAGNOSIS — J449 Chronic obstructive pulmonary disease, unspecified: Secondary | ICD-10-CM | POA: Diagnosis not present

## 2017-02-17 DIAGNOSIS — L02213 Cutaneous abscess of chest wall: Secondary | ICD-10-CM | POA: Diagnosis not present

## 2017-02-17 DIAGNOSIS — Z9889 Other specified postprocedural states: Secondary | ICD-10-CM | POA: Diagnosis not present

## 2017-02-17 DIAGNOSIS — M6281 Muscle weakness (generalized): Secondary | ICD-10-CM | POA: Diagnosis not present

## 2017-02-17 DIAGNOSIS — T8189XA Other complications of procedures, not elsewhere classified, initial encounter: Secondary | ICD-10-CM | POA: Diagnosis not present

## 2017-02-24 DIAGNOSIS — I10 Essential (primary) hypertension: Secondary | ICD-10-CM | POA: Diagnosis not present

## 2017-02-24 DIAGNOSIS — I2510 Atherosclerotic heart disease of native coronary artery without angina pectoris: Secondary | ICD-10-CM | POA: Diagnosis not present

## 2017-02-24 DIAGNOSIS — J9851 Mediastinitis: Secondary | ICD-10-CM | POA: Diagnosis not present

## 2017-02-24 DIAGNOSIS — I4891 Unspecified atrial fibrillation: Secondary | ICD-10-CM | POA: Diagnosis not present

## 2017-02-26 ENCOUNTER — Other Ambulatory Visit: Payer: Self-pay

## 2017-02-26 DIAGNOSIS — F329 Major depressive disorder, single episode, unspecified: Secondary | ICD-10-CM | POA: Diagnosis not present

## 2017-02-26 DIAGNOSIS — M1991 Primary osteoarthritis, unspecified site: Secondary | ICD-10-CM | POA: Diagnosis not present

## 2017-02-26 DIAGNOSIS — I10 Essential (primary) hypertension: Secondary | ICD-10-CM | POA: Diagnosis not present

## 2017-02-26 DIAGNOSIS — I2510 Atherosclerotic heart disease of native coronary artery without angina pectoris: Secondary | ICD-10-CM | POA: Diagnosis not present

## 2017-02-26 DIAGNOSIS — F411 Generalized anxiety disorder: Secondary | ICD-10-CM | POA: Diagnosis not present

## 2017-02-26 DIAGNOSIS — J441 Chronic obstructive pulmonary disease with (acute) exacerbation: Secondary | ICD-10-CM | POA: Diagnosis not present

## 2017-02-26 NOTE — Patient Outreach (Signed)
[facility]) Care Management  02/26/2017  [name] SCHIRRA Sr. 1955-12-08 [id number]  Referral date: 12/14/16 Referral source: [employer] transition of care status post hospital discharge from [facility] Program: pneumonia Insurance: [employer] Providers: Dr. Linward Foster, primary MD, Dr. Nona Dell, cardiology Social support: Patient currently residing with his son in [place], [place]  Telephone call to patient regarding transition of care follow up. Unable to reach patient.  Call forwarded to patients voice mail. HIPAA compliant voice message left with call back phone number.   PLAN; RNCM will send patient outreach letter to attempt contact.   George Ina RN,BSN,CCM [facility] Telephonic  [phone]

## 2017-03-01 DIAGNOSIS — A4902 Methicillin resistant Staphylococcus aureus infection, unspecified site: Secondary | ICD-10-CM | POA: Diagnosis not present

## 2017-03-01 DIAGNOSIS — L02213 Cutaneous abscess of chest wall: Secondary | ICD-10-CM | POA: Diagnosis not present

## 2017-03-01 DIAGNOSIS — M6281 Muscle weakness (generalized): Secondary | ICD-10-CM | POA: Diagnosis not present

## 2017-03-01 DIAGNOSIS — T8189XA Other complications of procedures, not elsewhere classified, initial encounter: Secondary | ICD-10-CM | POA: Diagnosis not present

## 2017-03-02 DIAGNOSIS — J441 Chronic obstructive pulmonary disease with (acute) exacerbation: Secondary | ICD-10-CM | POA: Diagnosis not present

## 2017-03-02 DIAGNOSIS — F329 Major depressive disorder, single episode, unspecified: Secondary | ICD-10-CM | POA: Diagnosis not present

## 2017-03-02 DIAGNOSIS — M1991 Primary osteoarthritis, unspecified site: Secondary | ICD-10-CM | POA: Diagnosis not present

## 2017-03-02 DIAGNOSIS — I10 Essential (primary) hypertension: Secondary | ICD-10-CM | POA: Diagnosis not present

## 2017-03-02 DIAGNOSIS — I2510 Atherosclerotic heart disease of native coronary artery without angina pectoris: Secondary | ICD-10-CM | POA: Diagnosis not present

## 2017-03-02 DIAGNOSIS — F411 Generalized anxiety disorder: Secondary | ICD-10-CM | POA: Diagnosis not present

## 2017-03-03 DIAGNOSIS — J9851 Mediastinitis: Secondary | ICD-10-CM | POA: Diagnosis not present

## 2017-03-03 DIAGNOSIS — Z9889 Other specified postprocedural states: Secondary | ICD-10-CM | POA: Diagnosis not present

## 2017-03-03 DIAGNOSIS — T8189XD Other complications of procedures, not elsewhere classified, subsequent encounter: Secondary | ICD-10-CM | POA: Diagnosis not present

## 2017-03-04 DIAGNOSIS — J441 Chronic obstructive pulmonary disease with (acute) exacerbation: Secondary | ICD-10-CM | POA: Diagnosis not present

## 2017-03-04 DIAGNOSIS — F329 Major depressive disorder, single episode, unspecified: Secondary | ICD-10-CM | POA: Diagnosis not present

## 2017-03-04 DIAGNOSIS — I10 Essential (primary) hypertension: Secondary | ICD-10-CM | POA: Diagnosis not present

## 2017-03-04 DIAGNOSIS — M1991 Primary osteoarthritis, unspecified site: Secondary | ICD-10-CM | POA: Diagnosis not present

## 2017-03-04 DIAGNOSIS — F411 Generalized anxiety disorder: Secondary | ICD-10-CM | POA: Diagnosis not present

## 2017-03-04 DIAGNOSIS — I2510 Atherosclerotic heart disease of native coronary artery without angina pectoris: Secondary | ICD-10-CM | POA: Diagnosis not present

## 2017-03-07 DIAGNOSIS — F411 Generalized anxiety disorder: Secondary | ICD-10-CM | POA: Diagnosis not present

## 2017-03-07 DIAGNOSIS — J441 Chronic obstructive pulmonary disease with (acute) exacerbation: Secondary | ICD-10-CM | POA: Diagnosis not present

## 2017-03-07 DIAGNOSIS — I2510 Atherosclerotic heart disease of native coronary artery without angina pectoris: Secondary | ICD-10-CM | POA: Diagnosis not present

## 2017-03-07 DIAGNOSIS — F329 Major depressive disorder, single episode, unspecified: Secondary | ICD-10-CM | POA: Diagnosis not present

## 2017-03-07 DIAGNOSIS — I10 Essential (primary) hypertension: Secondary | ICD-10-CM | POA: Diagnosis not present

## 2017-03-07 DIAGNOSIS — M1991 Primary osteoarthritis, unspecified site: Secondary | ICD-10-CM | POA: Diagnosis not present

## 2017-03-09 DIAGNOSIS — T814XXD Infection following a procedure, subsequent encounter: Secondary | ICD-10-CM | POA: Diagnosis not present

## 2017-03-09 DIAGNOSIS — M1991 Primary osteoarthritis, unspecified site: Secondary | ICD-10-CM | POA: Diagnosis not present

## 2017-03-09 DIAGNOSIS — I2510 Atherosclerotic heart disease of native coronary artery without angina pectoris: Secondary | ICD-10-CM | POA: Diagnosis not present

## 2017-03-09 DIAGNOSIS — Z79899 Other long term (current) drug therapy: Secondary | ICD-10-CM | POA: Diagnosis not present

## 2017-03-09 DIAGNOSIS — F410 Panic disorder [episodic paroxysmal anxiety] without agoraphobia: Secondary | ICD-10-CM | POA: Diagnosis not present

## 2017-03-09 DIAGNOSIS — J441 Chronic obstructive pulmonary disease with (acute) exacerbation: Secondary | ICD-10-CM | POA: Diagnosis not present

## 2017-03-09 DIAGNOSIS — G8929 Other chronic pain: Secondary | ICD-10-CM | POA: Diagnosis not present

## 2017-03-09 DIAGNOSIS — M545 Low back pain: Secondary | ICD-10-CM | POA: Diagnosis not present

## 2017-03-09 DIAGNOSIS — F329 Major depressive disorder, single episode, unspecified: Secondary | ICD-10-CM | POA: Diagnosis not present

## 2017-03-09 DIAGNOSIS — I10 Essential (primary) hypertension: Secondary | ICD-10-CM | POA: Diagnosis not present

## 2017-03-09 DIAGNOSIS — F411 Generalized anxiety disorder: Secondary | ICD-10-CM | POA: Diagnosis not present

## 2017-03-09 DIAGNOSIS — G8928 Other chronic postprocedural pain: Secondary | ICD-10-CM | POA: Diagnosis not present

## 2017-03-11 DIAGNOSIS — J9851 Mediastinitis: Secondary | ICD-10-CM | POA: Diagnosis not present

## 2017-03-11 DIAGNOSIS — J441 Chronic obstructive pulmonary disease with (acute) exacerbation: Secondary | ICD-10-CM | POA: Diagnosis not present

## 2017-03-11 DIAGNOSIS — L03313 Cellulitis of chest wall: Secondary | ICD-10-CM | POA: Diagnosis not present

## 2017-03-11 DIAGNOSIS — T814XXA Infection following a procedure, initial encounter: Secondary | ICD-10-CM | POA: Diagnosis not present

## 2017-03-11 DIAGNOSIS — I2510 Atherosclerotic heart disease of native coronary artery without angina pectoris: Secondary | ICD-10-CM | POA: Diagnosis not present

## 2017-03-11 DIAGNOSIS — B9562 Methicillin resistant Staphylococcus aureus infection as the cause of diseases classified elsewhere: Secondary | ICD-10-CM | POA: Diagnosis not present

## 2017-03-14 DIAGNOSIS — B9562 Methicillin resistant Staphylococcus aureus infection as the cause of diseases classified elsewhere: Secondary | ICD-10-CM | POA: Diagnosis not present

## 2017-03-14 DIAGNOSIS — J9851 Mediastinitis: Secondary | ICD-10-CM | POA: Diagnosis not present

## 2017-03-14 DIAGNOSIS — T814XXA Infection following a procedure, initial encounter: Secondary | ICD-10-CM | POA: Diagnosis not present

## 2017-03-14 DIAGNOSIS — J441 Chronic obstructive pulmonary disease with (acute) exacerbation: Secondary | ICD-10-CM | POA: Diagnosis not present

## 2017-03-14 DIAGNOSIS — L03313 Cellulitis of chest wall: Secondary | ICD-10-CM | POA: Diagnosis not present

## 2017-03-14 DIAGNOSIS — I2510 Atherosclerotic heart disease of native coronary artery without angina pectoris: Secondary | ICD-10-CM | POA: Diagnosis not present

## 2017-03-16 DIAGNOSIS — J9851 Mediastinitis: Secondary | ICD-10-CM | POA: Diagnosis not present

## 2017-03-16 DIAGNOSIS — T814XXA Infection following a procedure, initial encounter: Secondary | ICD-10-CM | POA: Diagnosis not present

## 2017-03-16 DIAGNOSIS — L03313 Cellulitis of chest wall: Secondary | ICD-10-CM | POA: Diagnosis not present

## 2017-03-16 DIAGNOSIS — J441 Chronic obstructive pulmonary disease with (acute) exacerbation: Secondary | ICD-10-CM | POA: Diagnosis not present

## 2017-03-16 DIAGNOSIS — I2510 Atherosclerotic heart disease of native coronary artery without angina pectoris: Secondary | ICD-10-CM | POA: Diagnosis not present

## 2017-03-16 DIAGNOSIS — B9562 Methicillin resistant Staphylococcus aureus infection as the cause of diseases classified elsewhere: Secondary | ICD-10-CM | POA: Diagnosis not present

## 2017-03-18 DIAGNOSIS — J441 Chronic obstructive pulmonary disease with (acute) exacerbation: Secondary | ICD-10-CM | POA: Diagnosis not present

## 2017-03-18 DIAGNOSIS — I2510 Atherosclerotic heart disease of native coronary artery without angina pectoris: Secondary | ICD-10-CM | POA: Diagnosis not present

## 2017-03-18 DIAGNOSIS — B9562 Methicillin resistant Staphylococcus aureus infection as the cause of diseases classified elsewhere: Secondary | ICD-10-CM | POA: Diagnosis not present

## 2017-03-18 DIAGNOSIS — L03313 Cellulitis of chest wall: Secondary | ICD-10-CM | POA: Diagnosis not present

## 2017-03-18 DIAGNOSIS — J9851 Mediastinitis: Secondary | ICD-10-CM | POA: Diagnosis not present

## 2017-03-18 DIAGNOSIS — T814XXA Infection following a procedure, initial encounter: Secondary | ICD-10-CM | POA: Diagnosis not present

## 2017-03-21 DIAGNOSIS — I2510 Atherosclerotic heart disease of native coronary artery without angina pectoris: Secondary | ICD-10-CM | POA: Diagnosis not present

## 2017-03-21 DIAGNOSIS — T814XXA Infection following a procedure, initial encounter: Secondary | ICD-10-CM | POA: Diagnosis not present

## 2017-03-21 DIAGNOSIS — B9562 Methicillin resistant Staphylococcus aureus infection as the cause of diseases classified elsewhere: Secondary | ICD-10-CM | POA: Diagnosis not present

## 2017-03-21 DIAGNOSIS — L03313 Cellulitis of chest wall: Secondary | ICD-10-CM | POA: Diagnosis not present

## 2017-03-21 DIAGNOSIS — J9851 Mediastinitis: Secondary | ICD-10-CM | POA: Diagnosis not present

## 2017-03-21 DIAGNOSIS — J441 Chronic obstructive pulmonary disease with (acute) exacerbation: Secondary | ICD-10-CM | POA: Diagnosis not present

## 2017-03-30 ENCOUNTER — Other Ambulatory Visit: Payer: Self-pay

## 2017-03-30 NOTE — Patient Outreach (Signed)
[facility]) Care Management  03/30/2017  KAITO DIMLER Sr. Apr 22, 1956 [id number]  No response from patient after 3 telephone calls and outreach letter attempt.  Plan: RNCM will refer patient to care management assistant to close due to being unable to reach. RNCM will notify patients primary MD of closure.  RNCM will send patient closure letter.   George Ina RN,BSN,CCM [facility] Telephonic  [phone]

## 2018-03-29 ENCOUNTER — Observation Stay
Admission: EM | Admit: 2018-03-29 | Discharge: 2018-03-30 | Discharge status: Against Medical Advice | Payer: Medicare Other | Attending: Internal Medicine | Admitting: Internal Medicine

## 2018-03-29 ENCOUNTER — Other Ambulatory Visit: Payer: Self-pay

## 2018-03-29 ENCOUNTER — Emergency Department: Payer: Medicare Other

## 2018-03-29 DIAGNOSIS — F329 Major depressive disorder, single episode, unspecified: Secondary | ICD-10-CM | POA: Diagnosis not present

## 2018-03-29 DIAGNOSIS — E785 Hyperlipidemia, unspecified: Secondary | ICD-10-CM | POA: Diagnosis not present

## 2018-03-29 DIAGNOSIS — R7303 Prediabetes: Secondary | ICD-10-CM | POA: Diagnosis present

## 2018-03-29 DIAGNOSIS — K219 Gastro-esophageal reflux disease without esophagitis: Secondary | ICD-10-CM | POA: Diagnosis not present

## 2018-03-29 DIAGNOSIS — F411 Generalized anxiety disorder: Secondary | ICD-10-CM | POA: Diagnosis present

## 2018-03-29 DIAGNOSIS — I110 Hypertensive heart disease with heart failure: Secondary | ICD-10-CM | POA: Insufficient documentation

## 2018-03-29 DIAGNOSIS — J441 Chronic obstructive pulmonary disease with (acute) exacerbation: Secondary | ICD-10-CM | POA: Insufficient documentation

## 2018-03-29 DIAGNOSIS — F419 Anxiety disorder, unspecified: Secondary | ICD-10-CM | POA: Diagnosis not present

## 2018-03-29 DIAGNOSIS — G8929 Other chronic pain: Secondary | ICD-10-CM | POA: Diagnosis not present

## 2018-03-29 DIAGNOSIS — R0789 Other chest pain: Secondary | ICD-10-CM | POA: Diagnosis not present

## 2018-03-29 DIAGNOSIS — I2510 Atherosclerotic heart disease of native coronary artery without angina pectoris: Secondary | ICD-10-CM | POA: Diagnosis present

## 2018-03-29 DIAGNOSIS — R0602 Shortness of breath: Secondary | ICD-10-CM | POA: Diagnosis not present

## 2018-03-29 DIAGNOSIS — F32A Depression, unspecified: Secondary | ICD-10-CM | POA: Diagnosis present

## 2018-03-29 DIAGNOSIS — R079 Chest pain, unspecified: Secondary | ICD-10-CM | POA: Diagnosis present

## 2018-03-29 DIAGNOSIS — I25118 Atherosclerotic heart disease of native coronary artery with other forms of angina pectoris: Secondary | ICD-10-CM | POA: Diagnosis present

## 2018-03-29 DIAGNOSIS — Z7951 Long term (current) use of inhaled steroids: Secondary | ICD-10-CM | POA: Insufficient documentation

## 2018-03-29 DIAGNOSIS — I10 Essential (primary) hypertension: Secondary | ICD-10-CM | POA: Diagnosis present

## 2018-03-29 DIAGNOSIS — J449 Chronic obstructive pulmonary disease, unspecified: Secondary | ICD-10-CM | POA: Diagnosis present

## 2018-03-29 DIAGNOSIS — I5031 Acute diastolic (congestive) heart failure: Secondary | ICD-10-CM | POA: Insufficient documentation

## 2018-03-29 DIAGNOSIS — I5030 Unspecified diastolic (congestive) heart failure: Secondary | ICD-10-CM | POA: Diagnosis present

## 2018-03-29 LAB — CBC
HEMATOCRIT: 37.9 % — AB (ref 40.0–52.0)
HEMOGLOBIN: 12.6 g/dL — AB (ref 13.0–18.0)
MCH: 28.8 pg (ref 26.0–34.0)
MCHC: 33.3 g/dL (ref 32.0–36.0)
MCV: 86.5 fL (ref 80.0–100.0)
Platelets: 226 10*3/uL (ref 150–440)
RBC: 4.38 MIL/uL — AB (ref 4.40–5.90)
RDW: 16 % — ABNORMAL HIGH (ref 11.5–14.5)
WBC: 10.4 10*3/uL (ref 3.8–10.6)

## 2018-03-29 LAB — TROPONIN I: Troponin I: <0.03 ng/mL (ref <0.03)

## 2018-03-29 LAB — COMPREHENSIVE METABOLIC PANEL
ALK PHOS: 51 U/L (ref 38–126)
ALT: 18 U/L (ref 0–44)
AST: 29 U/L (ref 15–41)
Albumin: 3.5 g/dL (ref 3.5–5.0)
Anion gap: 9 (ref 5–15)
BUN: 18 mg/dL (ref 6–20)
CO2: 22 mmol/L (ref 22–32)
Calcium: 8.6 mg/dL — ABNORMAL LOW (ref 8.9–10.3)
Chloride: 106 mmol/L (ref 98–111)
Creatinine, Ser: 1.17 mg/dL (ref 0.61–1.24)
Glucose, Bld: 211 mg/dL — ABNORMAL HIGH (ref 70–99)
Potassium: 4.7 mmol/L (ref 3.5–5.1)
SODIUM: 137 mmol/L (ref 135–145)
Total Bilirubin: 0.9 mg/dL (ref 0.3–1.2)
Total Protein: 6.5 g/dL (ref 6.5–8.1)

## 2018-03-29 LAB — GLUCOSE, CAPILLARY: Glucose-Capillary: 131 mg/dL — ABNORMAL HIGH (ref 70–99)

## 2018-03-29 MED ORDER — OXYCODONE-ACETAMINOPHEN 10-325 MG PO TABS: 1.0000 | ORAL_TABLET | Freq: Three times a day (TID) | ORAL | Status: DC

## 2018-03-29 MED ORDER — FUROSEMIDE 40 MG PO TABS
40.0000 mg | ORAL_TABLET | Freq: Every morning | ORAL | Status: DC
  Administered 2018-03-30: 40 mg via ORAL
  Filled 2018-03-29: qty 1

## 2018-03-29 MED ORDER — ENOXAPARIN SODIUM 40 MG/0.4ML ~~LOC~~ SOLN
40.0000 mg | Freq: Two times a day (BID) | SUBCUTANEOUS | Status: DC
  Administered 2018-03-30: 40 mg via SUBCUTANEOUS
  Filled 2018-03-29: qty 0.4

## 2018-03-29 MED ORDER — AMIODARONE HCL 200 MG PO TABS
200.0000 mg | ORAL_TABLET | Freq: Every day | ORAL | Status: DC
  Administered 2018-03-30: 200 mg via ORAL
  Filled 2018-03-29: qty 1

## 2018-03-29 MED ORDER — NITROGLYCERIN 0.4 MG SL SUBL
0.4000 mg | SUBLINGUAL_TABLET | Freq: Once | SUBLINGUAL | Status: AC
  Administered 2018-03-29 (×2): 0.4 mg via SUBLINGUAL
  Filled 2018-03-29: qty 1

## 2018-03-29 MED ORDER — PREDNISONE 20 MG PO TABS: 40.0000 mg | ORAL_TABLET | Freq: Every day | ORAL | Status: DC

## 2018-03-29 MED ORDER — ACETAMINOPHEN 650 MG RE SUPP: 650.0000 mg | Freq: Four times a day (QID) | RECTAL | Status: DC | PRN

## 2018-03-29 MED ORDER — INSULIN ASPART 100 UNIT/ML ~~LOC~~ SOLN: 0.0000 [IU] | Freq: Every day | SUBCUTANEOUS | Status: DC

## 2018-03-29 MED ORDER — PANTOPRAZOLE SODIUM 20 MG PO TBEC
20.0000 mg | DELAYED_RELEASE_TABLET | Freq: Every day | ORAL | Status: DC
  Administered 2018-03-30: 20 mg via ORAL
  Filled 2018-03-29: qty 1

## 2018-03-29 MED ORDER — ISOSORBIDE MONONITRATE ER 30 MG PO TB24
30.0000 mg | ORAL_TABLET | Freq: Every morning | ORAL | Status: DC
  Administered 2018-03-30: 30 mg via ORAL
  Filled 2018-03-29: qty 1

## 2018-03-29 MED ORDER — CITALOPRAM HYDROBROMIDE 20 MG PO TABS
10.0000 mg | ORAL_TABLET | Freq: Every morning | ORAL | Status: DC
  Administered 2018-03-30: 10 mg via ORAL
  Filled 2018-03-29: qty 1

## 2018-03-29 MED ORDER — TIOTROPIUM BROMIDE MONOHYDRATE 18 MCG IN CAPS
18.0000 ug | ORAL_CAPSULE | Freq: Every day | RESPIRATORY_TRACT | Status: DC
  Filled 2018-03-29: qty 5

## 2018-03-29 MED ORDER — ONDANSETRON HCL 4 MG PO TABS: 4.0000 mg | ORAL_TABLET | Freq: Four times a day (QID) | ORAL | Status: DC | PRN

## 2018-03-29 MED ORDER — FUROSEMIDE 10 MG/ML IJ SOLN
40.0000 mg | Freq: Once | INTRAMUSCULAR | Status: AC
  Administered 2018-03-29: 40 mg via INTRAVENOUS
  Filled 2018-03-29: qty 4

## 2018-03-29 MED ORDER — INSULIN ASPART 100 UNIT/ML ~~LOC~~ SOLN
0.0000 [IU] | Freq: Three times a day (TID) | SUBCUTANEOUS | Status: DC
  Administered 2018-03-30: 1 [IU] via SUBCUTANEOUS
  Filled 2018-03-29: qty 1

## 2018-03-29 MED ORDER — ATORVASTATIN CALCIUM 10 MG PO TABS: 10.0000 mg | ORAL_TABLET | Freq: Every day | ORAL | Status: DC

## 2018-03-29 MED ORDER — HYDROMORPHONE HCL 1 MG/ML IJ SOLN
INTRAMUSCULAR | Status: AC
  Administered 2018-03-29: 1 mg via INTRAVENOUS
  Filled 2018-03-29: qty 1

## 2018-03-29 MED ORDER — ONDANSETRON HCL 4 MG/2ML IJ SOLN: 4.0000 mg | Freq: Four times a day (QID) | INTRAMUSCULAR | Status: DC | PRN

## 2018-03-29 MED ORDER — ALPRAZOLAM 1 MG PO TABS
1.0000 mg | ORAL_TABLET | Freq: Three times a day (TID) | ORAL | Status: DC
  Administered 2018-03-30 (×2): 1 mg via ORAL
  Filled 2018-03-29 (×2): qty 1

## 2018-03-29 MED ORDER — ASPIRIN 81 MG PO CHEW
81.0000 mg | CHEWABLE_TABLET | Freq: Every day | ORAL | Status: DC
  Administered 2018-03-30: 81 mg via ORAL
  Filled 2018-03-29: qty 1

## 2018-03-29 MED ORDER — RAMELTEON 8 MG PO TABS
8.0000 mg | ORAL_TABLET | Freq: Every day | ORAL | Status: DC
  Administered 2018-03-30: 8 mg via ORAL
  Filled 2018-03-29 (×2): qty 1

## 2018-03-29 MED ORDER — TAMSULOSIN HCL 0.4 MG PO CAPS
0.4000 mg | ORAL_CAPSULE | Freq: Every day | ORAL | Status: DC
  Administered 2018-03-30: 0.4 mg via ORAL
  Filled 2018-03-29: qty 1

## 2018-03-29 MED ORDER — DOXEPIN HCL 25 MG PO CAPS
25.0000 mg | ORAL_CAPSULE | Freq: Every day | ORAL | Status: DC
  Administered 2018-03-30: 25 mg via ORAL
  Filled 2018-03-29 (×2): qty 1

## 2018-03-29 MED ORDER — MOMETASONE FURO-FORMOTEROL FUM 200-5 MCG/ACT IN AERO
2.0000 | INHALATION_SPRAY | Freq: Two times a day (BID) | RESPIRATORY_TRACT | Status: DC
  Administered 2018-03-30 (×2): 2 via RESPIRATORY_TRACT
  Filled 2018-03-29: qty 8.8

## 2018-03-29 MED ORDER — HYDROMORPHONE HCL 1 MG/ML IJ SOLN
1.0000 mg | Freq: Once | INTRAMUSCULAR | Status: AC
  Administered 2018-03-29: 1 mg via INTRAVENOUS

## 2018-03-29 MED ORDER — MORPHINE SULFATE (PF) 2 MG/ML IV SOLN: 2.0000 mg | INTRAVENOUS | Status: DC | PRN

## 2018-03-29 MED ORDER — FENTANYL CITRATE (PF) 100 MCG/2ML IJ SOLN
100.0000 ug | Freq: Once | INTRAMUSCULAR | Status: AC
  Administered 2018-03-29: 100 ug via INTRAVENOUS
  Filled 2018-03-29: qty 2

## 2018-03-29 MED ORDER — ACETAMINOPHEN 325 MG PO TABS: 650.0000 mg | ORAL_TABLET | Freq: Four times a day (QID) | ORAL | Status: DC | PRN

## 2018-03-29 MED ORDER — METOPROLOL TARTRATE 25 MG PO TABS
25.0000 mg | ORAL_TABLET | Freq: Every day | ORAL | Status: DC
  Administered 2018-03-30: 25 mg via ORAL
  Filled 2018-03-29: qty 1

## 2018-03-29 MED ORDER — IPRATROPIUM-ALBUTEROL 0.5-2.5 (3) MG/3ML IN SOLN: 3.0000 mL | Freq: Four times a day (QID) | RESPIRATORY_TRACT | Status: DC | PRN

## 2018-03-29 MED ORDER — OXYCODONE-ACETAMINOPHEN 5-325 MG PO TABS
1.0000 | ORAL_TABLET | Freq: Three times a day (TID) | ORAL | Status: DC
  Administered 2018-03-30 (×2): 1 via ORAL
  Filled 2018-03-29 (×2): qty 1

## 2018-03-29 MED ORDER — OXYCODONE HCL 5 MG PO TABS
5.0000 mg | ORAL_TABLET | Freq: Three times a day (TID) | ORAL | Status: DC
  Administered 2018-03-30 (×2): 5 mg via ORAL
  Filled 2018-03-29 (×2): qty 1

## 2018-03-29 NOTE — ED Notes (Signed)
Pt is back from medical imaging. 

## 2018-03-29 NOTE — ED Notes (Signed)
Pt requested to go to xray and not receive any further ntg at this point

## 2018-03-29 NOTE — ED Notes (Signed)
Pt was given graham crackers and peanut butter and a cola.

## 2018-03-29 NOTE — ED Notes (Signed)
Pt is given a boxed lunch.

## 2018-03-29 NOTE — ED Notes (Signed)
Pt stated that the pain medicine that was given previous is not helping with his pain.

## 2018-03-29 NOTE — ED Provider Notes (Signed)
[facility] Emergency Department Provider Note  Time seen: 5:56 PM  I have reviewed the triage vital signs and the nursing notes.   HISTORY  Chief Complaint Chest Pain    HPI [name] SCHELLENBERG Sr. is a 62 y.o. male with a past medical history of anxiety, arthritis, CAD status post CABG, CHF, COPD, gastric reflux, hypertension, hyperlipidemia, presents to the emergency department for chest pain.  According to the patient for the past 2 to 3 days he has been experiencing central chest pain or shortness of breath worse with exertion.  States this is happened multiple times in the past he states is usually due to fluid accumulating in the lungs requiring admission to the hospital for diuresis.  Patient states he took 2 tablets of nitroglycerin today without relief so he came to the emergency department for evaluation.  States chest discomfort has been ongoing for the past 2 to 3 days but worse since around 12 PM today.  Denies any nausea or vomiting.  Does state shortness of breath much worse with exertion.  Denies any diaphoresis.  States he is also noticed increased lower extremity swelling over the past 2 days.   Past Medical History:  Diagnosis Date  . Anxiety disorder   . Arthritis   . CAD (coronary artery disease)    a. ~ 2000 cath at [facility];  b. 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  . CHF (congestive heart failure) (HCC)   . Chronic back pain   . Chronic hip pain   . COPD (chronic obstructive pulmonary disease) (HCC)   . Depression   . Essential hypertension, benign   . GERD (gastroesophageal reflux disease)   . Headache(784.0)   . History of alcohol abuse   . History of cocaine abuse   . Hyperlipidemia   . Obesity   . Pneumonia     Patient Active Problem List   Diagnosis Date Noted  . Chest pain, rule out acute  myocardial infarction 02/25/2016  . NSTEMI (non-ST elevated myocardial infarction) (HCC) 07/11/2015  . Chest pain 04/30/2015  . Syncope 04/30/2015  . Orthostatic syncope 04/30/2015  . Chronic back pain   . CHF (congestive heart failure) (HCC)   . History of cocaine abuse   . History of alcohol abuse   . Prediabetes 08/12/2014  . Tobacco abuse 09/21/2013  . Sleep apnea- C-pap intol 09/14/2013  . Coronary atherosclerosis of native coronary artery   . OBESITY, MORBID 05/29/2008  . Dyslipidemia 01/19/2007  . ANXIETY DISORDER, GENERALIZED 01/19/2007  . DEPRESSION, MAJOR, RECURRENT 10/20/2006  . Essential hypertension, benign 10/20/2006  . COPD 10/20/2006    Past Surgical History:  Procedure Laterality Date  . BACK SURGERY    . CHOLECYSTECTOMY    . CORONARY ARTERY BYPASS GRAFT    . HIP SURGERY     rt  . JOINT REPLACEMENT    . LEFT HEART CATHETERIZATION WITH CORONARY ANGIOGRAM N/A 09/12/2013   Procedure: LEFT HEART CATHETERIZATION WITH CORONARY ANGIOGRAM;  Surgeon: Kathleene Hazel, MD;  Location: [facility] CATH LAB;  Service: Cardiovascular;  Laterality: N/A;  . NASAL/SINUS ENDOSCOPY    . TOTAL HIP ARTHROPLASTY  05/08/2012   Procedure: TOTAL HIP ARTHROPLASTY;  Surgeon: Nestor Lewandowsky, MD;  Location: [facility] OR;  Service: Orthopedics;  Laterality: Left;  . TOTAL HIP ARTHROPLASTY  2014  per pt    Prior to Admission medications   Medication Sig Start Date End Date Taking? Authorizing Provider  ALPRAZolam Prudy Feeler) 1 MG tablet Take 1 tablet (1 mg total) by mouth 3 (three) times daily as needed for anxiety. For anxiety Patient taking differently: Take 1 mg by mouth 3 (three) times daily. For anxiety 05/15/12   Kathlen Mody, MD  amiodarone (PACERONE) 200 MG tablet Take 1 tablet by mouth daily. 01/09/16   [provider]  amLODipine (NORVASC) 10 MG tablet Take 10 mg by mouth daily.    [provider]  aspirin 81 MG tablet Take 1 tablet (81 mg total) by mouth daily. 05/31/12   Kathlen Mody, MD  atorvastatin (LIPITOR) 10 MG tablet TAKE (1) TABLET BY MOUTH ONCE DAILY. 08/13/15   Jonelle Sidle, MD  budesonide-formoterol [facility]) 160-4.5 MCG/ACT inhaler Inhale 2 puffs into the lungs 2 (two) times daily.    [provider]  budesonide-formoterol (SYMBICORT) 160-4.5 MCG/ACT inhaler Inhale 2 Inhalers into the lungs 2 (two) times daily. 08/30/16   [provider]  citalopram (CELEXA) 10 MG tablet Take 10 mg by mouth every morning.    [provider]  doxepin (SINEQUAN) 25 MG capsule Take 25 mg by mouth at bedtime.  08/06/14   [provider]  furosemide (LASIX) 40 MG tablet Take 40 mg by mouth every morning.    [provider]  gabapentin (NEURONTIN) 300 MG capsule Take 1 capsule by mouth 3 (three) times daily. 08/30/16   [provider]  hydrochlorothiazide (HYDRODIURIL) 25 MG tablet Take 25 mg by mouth every morning.  08/06/14   [provider]  ipratropium-albuterol (DUONEB) 0.5-2.5 (3) MG/3ML SOLN Take 3 mLs by nebulization every 6 (six) hours as needed (shortness of breath/ wheezing.).     [provider]  isosorbide mononitrate (IMDUR) 30 MG 24 hr tablet Take 1 tablet (30 mg total) by mouth 2 (two) times daily. Patient taking differently: Take 30 mg by mouth every morning.  08/12/14   Dhungel, Theda Belfast, MD  meloxicam (MOBIC) 15 MG tablet Take 15 mg by mouth every morning.  08/13/13   [provider]  metoprolol tartrate (LOPRESSOR) 25 MG tablet Take 1 tablet by mouth daily. 01/09/16   [provider]  nitroGLYCERIN (NITROSTAT) 0.4 MG SL tablet Place 0.4 mg under the tongue every 5 (five) minutes as needed. For chest pain    [provider]  omeprazole (PRILOSEC) 20 MG capsule Take 20 mg by mouth every morning.     [provider]  ondansetron (ZOFRAN) 4 MG tablet Take 1 tablet by mouth 2 (two) times daily as needed. 12/02/15   [provider]   oxyCODONE-acetaminophen (PERCOCET) 10-325 MG per tablet Take 1 tablet by mouth 3 (three) times daily.  09/30/14   [provider]  potassium chloride SA (K-DUR,KLOR-CON) 20 MEQ tablet Take 20 mEq by mouth every morning.  08/13/13   [provider]  predniSONE (DELTASONE) 10 MG tablet Take 10 mg by mouth as directed. 08/30/16   [provider]  PROAIR HFA 108 (90 BASE) MCG/ACT inhaler Inhale 2 puffs into the lungs daily as needed for wheezing or shortness of breath.  06/20/13   [provider]  ranolazine (RANEXA) 500 MG 12 hr tablet Take 1 tablet (500 mg total) by mouth 2 (two) times daily. Patient not taking: Reported on 08/31/2016 08/12/14   Dhungel, Theda Belfast, MD  tamsulosin (FLOMAX) 0.4 MG CAPS capsule Take 1 capsule by mouth  daily. 01/09/16   [provider]  tiotropium (SPIRIVA) 18 MCG inhalation capsule Place 18 mcg into inhaler and inhale daily.    [provider]  VITAMIN E PO Take 1 tablet by mouth daily.    [provider]  VOLTAREN 1 % GEL Apply 2 g topically daily as needed (pain).  02/27/15   [provider]    Allergies  Allergen Reactions  . Ativan [Lorazepam] Other (See Comments)    "out of mind"  . Dilaudid [Hydromorphone Hcl] Other (See Comments)    " out of mind"  . Haloperidol Decanoate     Family History  Problem Relation Age of Onset  . Heart attack Mother   . Heart attack Brother   . Heart attack Sister     Social History Social History   Tobacco Use  . Smoking status: Former Smoker    Packs/day: 0.50    Years: 39.00    Pack years: 19.50    Types: Cigarettes    Last attempt to quit: 09/15/2015    Years since quitting: 2.5  . Smokeless tobacco: Never Used  Substance Use Topics  . Alcohol use: Yes    Alcohol/week: 0.0 oz    Comment: socially; reports drinking 2 beers last nigh  . Drug use: No    Review of Systems Constitutional: Negative for fever Cardiovascular: Central chest  pain Respiratory: Shortness of breath worse with exertion Gastrointestinal: Negative for abdominal pain, vomiting Genitourinary: Negative for urinary compaints Musculoskeletal: Positive for lower extremity swelling Skin: Negative for skin complaints  Neurological: Negative for headache All other ROS negative  ____________________________________________   PHYSICAL EXAM:  VITAL SIGNS: ED Triage Vitals  Enc Vitals Group     BP 03/29/18 1709 (!) 144/76     Pulse Rate 03/29/18 1709 84     Resp 03/29/18 1709 18     Temp 03/29/18 1709 98.9 F (37.2 C)     Temp Source 03/29/18 1709 Oral     SpO2 03/29/18 1709 97 %     Weight 03/29/18 1710 (!) 331 lb (150.1 kg)     Height 03/29/18 1710 6\' 3"  (1.905 m)     Head Circumference --      Peak Flow --      Pain Score 03/29/18 1710 8     Pain Loc --      Pain Edu? --      Excl. in GC? --    Constitutional: Alert and oriented. Well appearing and in no distress. Eyes: Normal exam ENT   Head: Normocephalic and atraumatic.   Mouth/Throat: Mucous membranes are moist. Cardiovascular: Normal rate, regular rhythm.  Respiratory: Normal respiratory effort without tachypnea nor retractions. Breath sounds are clear  Gastrointestinal: Soft and nontender. No distention. Musculoskeletal: Nontender with normal range of motion in all extremities.  Mild pedal edema bilaterally. Neurologic:  Normal speech and language. No gross focal neurologic deficits Skin:  Skin is warm, dry and intact.  Psychiatric: Mood and affect are normal.   ____________________________________________    EKG  EKG reviewed and interpreted by myself shows normal sinus rhythm 83 bpm with a narrow QRS, normal axis, normal intervals, no concerning ST changes.  ____________________________________________    RADIOLOGY  X-ray negative  ____________________________________________   INITIAL IMPRESSION / ASSESSMENT AND PLAN / ED COURSE  Pertinent labs & imaging  results that were available during my care of the patient were reviewed by me and considered in my medical decision making (see chart for details).  Patient presents to the emergency department for chest pain shortness of breath with exertion.  Differential would include angina, ACS, pulmonary edema, CHF.  We will check labs including cardiac enzymes, chest x-ray and continue to closely monitor.  EKG does not appear to show any acute or concerning abnormalities.  Patient's x-rays negative, labs are largely within normal limits/baseline for the patient.  Troponin negative.  Given the patient's increased swelling his lower extremities or shortness of breath we will dose Lasix.  Given his significant cardiac history will admit to the hospitalist service for further treatment and evaluation.  ____________________________________________   FINAL CLINICAL IMPRESSION(S) / ED DIAGNOSES  Chest pain Dyspnea    Minna Antis, MD 03/29/18 2053

## 2018-03-29 NOTE — ED Triage Notes (Signed)
Pt c/o chest pain over the past 3 days with increased SOB, states today he just cant catch his breathe. Pt has a hx of 11 heart surgery and CHF. Pt is tachypneic and diaphoretic on arrival.

## 2018-03-29 NOTE — ED Notes (Signed)
hospitalist in with patient.

## 2018-03-29 NOTE — H&P (Signed)
[facility] [place] [facility]   PATIENT NAME: [name]    MR#:  [id number]  DATE OF BIRTH:  [birth year]  DATE OF ADMISSION:  03/29/2018  PRIMARY CARE PHYSICIAN: Bubba Camp, MD   REQUESTING/REFERRING PHYSICIAN: Lenard Lance, MD  CHIEF COMPLAINT:   Chief Complaint  Patient presents with  . Chest Pain    HISTORY OF PRESENT ILLNESS:  [name]  is a 62 y.o. male who presents with chief complaint as above.  Patient states that he has had some chest pain over the past few days.  He recently moved homes and states that may have "overdone it".  He states that he has a history of heart failure, and and sometimes will have to come in and get diuresis when he has symptoms like this.  He has a history of coronary bypass.  Tonight his work-up is largely within normal limits.  He was given a dose of Lasix in the ED with some improvement in his symptoms.  Hospitalist called for admission  PAST MEDICAL HISTORY:   Past Medical History:  Diagnosis Date  . Anxiety disorder   . Arthritis   . CAD (coronary artery disease)    a. ~ 2000 cath at [facility];  b. 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  . CHF (congestive heart failure) (HCC)   . Chronic back pain   . Chronic hip pain   . COPD (chronic obstructive pulmonary disease) (HCC)   . Depression   . Essential hypertension, benign   . GERD (gastroesophageal reflux disease)   . Headache(784.0)   . History of alcohol abuse   . History of cocaine abuse   . Hyperlipidemia   . Obesity   . Pneumonia      PAST SURGICAL HISTORY:   Past Surgical History:  Procedure Laterality Date  . BACK SURGERY    . CHOLECYSTECTOMY    . CORONARY ARTERY BYPASS GRAFT    . HIP SURGERY     rt  . JOINT REPLACEMENT    . LEFT HEART CATHETERIZATION WITH CORONARY ANGIOGRAM N/A 09/12/2013    Procedure: LEFT HEART CATHETERIZATION WITH CORONARY ANGIOGRAM;  Surgeon: Kathleene Hazel, MD;  Location: [facility] CATH LAB;  Service: Cardiovascular;  Laterality: N/A;  . NASAL/SINUS ENDOSCOPY    . TOTAL HIP ARTHROPLASTY  05/08/2012   Procedure: TOTAL HIP ARTHROPLASTY;  Surgeon: Nestor Lewandowsky, MD;  Location: [facility] OR;  Service: Orthopedics;  Laterality: Left;  . TOTAL HIP ARTHROPLASTY  2014 per pt     SOCIAL HISTORY:   Social History   Tobacco Use  . Smoking status: Former Smoker    Packs/day: 0.50    Years: 39.00    Pack years: 19.50    Types: Cigarettes    Last attempt to quit: 09/15/2015    Years since quitting: 2.5  . Smokeless tobacco: Never Used  Substance Use Topics  . Alcohol use: Yes    Alcohol/week: 0.0 oz    Comment: socially; reports drinking 2 beers last nigh     FAMILY HISTORY:   Family History  Problem Relation Age of Onset  . Heart attack Mother   . Heart attack Brother   . Heart attack Sister      DRUG ALLERGIES:   Allergies  Allergen Reactions  . Ativan [Lorazepam] Other (See Comments)    "  out of mind"  . Haloperidol Decanoate     MEDICATIONS AT HOME:   Prior to Admission medications   Medication Sig Start Date End Date Taking? Authorizing Provider  ALPRAZolam Prudy Feeler) 1 MG tablet Take 1 tablet (1 mg total) by mouth 3 (three) times daily as needed for anxiety. For anxiety Patient taking differently: Take 1 mg by mouth 3 (three) times daily. For anxiety 05/15/12   Kathlen Mody, MD  amiodarone (PACERONE) 200 MG tablet Take 1 tablet by mouth daily. 01/09/16   [provider]  amLODipine (NORVASC) 10 MG tablet Take 10 mg by mouth daily.    [provider]  aspirin 81 MG tablet Take 1 tablet (81 mg total) by mouth daily. 05/31/12   Kathlen Mody, MD  atorvastatin (LIPITOR) 10 MG tablet TAKE (1) TABLET BY MOUTH ONCE DAILY. 08/13/15   Jonelle Sidle, MD  budesonide-formoterol [facility]) 160-4.5 MCG/ACT inhaler Inhale 2 puffs into  the lungs 2 (two) times daily.    [provider]  budesonide-formoterol (SYMBICORT) 160-4.5 MCG/ACT inhaler Inhale 2 Inhalers into the lungs 2 (two) times daily. 08/30/16   [provider]  citalopram (CELEXA) 10 MG tablet Take 10 mg by mouth every morning.    [provider]  doxepin (SINEQUAN) 25 MG capsule Take 25 mg by mouth at bedtime.  08/06/14   [provider]  furosemide (LASIX) 40 MG tablet Take 40 mg by mouth every morning.    [provider]  gabapentin (NEURONTIN) 300 MG capsule Take 1 capsule by mouth 3 (three) times daily. 08/30/16   [provider]  hydrochlorothiazide (HYDRODIURIL) 25 MG tablet Take 25 mg by mouth every morning.  08/06/14   [provider]  ipratropium-albuterol (DUONEB) 0.5-2.5 (3) MG/3ML SOLN Take 3 mLs by nebulization every 6 (six) hours as needed (shortness of breath/ wheezing.).     [provider]  isosorbide mononitrate (IMDUR) 30 MG 24 hr tablet Take 1 tablet (30 mg total) by mouth 2 (two) times daily. Patient taking differently: Take 30 mg by mouth every morning.  08/12/14   Dhungel, Theda Belfast, MD  meloxicam (MOBIC) 15 MG tablet Take 15 mg by mouth every morning.  08/13/13   [provider]  metoprolol tartrate (LOPRESSOR) 25 MG tablet Take 1 tablet by mouth daily. 01/09/16   [provider]  nitroGLYCERIN (NITROSTAT) 0.4 MG SL tablet Place 0.4 mg under the tongue every 5 (five) minutes as needed. For chest pain    [provider]  omeprazole (PRILOSEC) 20 MG capsule Take 20 mg by mouth every morning.     [provider]  ondansetron (ZOFRAN) 4 MG tablet Take 1 tablet by mouth 2 (two) times daily as needed. 12/02/15   [provider]  oxyCODONE-acetaminophen (PERCOCET) 10-325 MG per tablet Take 1 tablet by mouth 3 (three) times daily.  09/30/14   [provider]  potassium chloride SA (K-DUR,KLOR-CON) 20 MEQ tablet Take 20 mEq by mouth every  morning.  08/13/13   [provider]  predniSONE (DELTASONE) 10 MG tablet Take 10 mg by mouth as directed. 08/30/16   [provider]  PROAIR HFA 108 (90 BASE) MCG/ACT inhaler Inhale 2 puffs into the lungs daily as needed for wheezing or shortness of breath.  06/20/13   [provider]  ranolazine (RANEXA) 500 MG 12 hr tablet Take 1 tablet (500 mg total) by mouth 2 (two) times daily. Patient not taking: Reported on 08/31/2016 08/12/14   Eddie North, MD  tamsulosin (FLOMAX) 0.4 MG CAPS capsule Take 1 capsule by mouth daily. 01/09/16   [provider]  tiotropium (SPIRIVA) 18 MCG inhalation capsule Place 18 mcg into inhaler and inhale daily.    [provider]  VITAMIN E PO Take 1 tablet by mouth daily.    [provider]  VOLTAREN 1 % GEL Apply 2 g topically daily as needed (pain).  02/27/15   [provider]    REVIEW OF SYSTEMS:  Review of Systems  Constitutional: Negative for chills, fever, malaise/fatigue and weight loss.  HENT: Negative for ear pain, hearing loss and tinnitus.   Eyes: Negative for blurred vision, double vision, pain and redness.  Respiratory: Positive for shortness of breath. Negative for cough and hemoptysis.   Cardiovascular: Positive for chest pain and leg swelling. Negative for palpitations and orthopnea.  Gastrointestinal: Negative for abdominal pain, constipation, diarrhea, nausea and vomiting.  Genitourinary: Negative for dysuria, frequency and hematuria.  Musculoskeletal: Negative for back pain, joint pain and neck pain.  Skin:       No acne, rash, or lesions  Neurological: Negative for dizziness, tremors, focal weakness and weakness.  Endo/Heme/Allergies: Negative for polydipsia. Does not bruise/bleed easily.  Psychiatric/Behavioral: Negative for depression. The patient is not nervous/anxious and does not have insomnia.      VITAL SIGNS:   Vitals:   03/29/18 1838 03/29/18 1843 03/29/18 1921  03/29/18 1930  BP: 134/79 132/69 122/76 116/77  Pulse:   66   Resp: 19 16 16 11   Temp:      TempSrc:      SpO2:   97%   Weight:      Height:       Wt Readings from Last 3 Encounters:  03/29/18 (!) 150.1 kg (331 lb)  02/25/16 119.7 kg (264 lb)  07/13/15 (!) 146.5 kg (323 lb)    PHYSICAL EXAMINATION:  Physical Exam  Vitals reviewed. Constitutional: He is oriented to person, place, and time. He appears well-developed and well-nourished. No distress.  HENT:  Head: Normocephalic and atraumatic.  Mouth/Throat: Oropharynx is clear and moist.  Eyes: Pupils are equal, round, and reactive to light. Conjunctivae and EOM are normal. No scleral icterus.  Neck: Normal range of motion. Neck supple. No JVD present. No thyromegaly present.  Cardiovascular: Normal rate, regular rhythm and intact distal pulses. Exam reveals no gallop and no friction rub.  No murmur heard. Respiratory: Effort normal and breath sounds normal. No respiratory distress. He has no wheezes. He has no rales.  GI: Soft. Bowel sounds are normal. He exhibits no distension. There is no tenderness.  Musculoskeletal: Normal range of motion. He exhibits edema (1+ lower extremity edema).  No arthritis, no gout  Lymphadenopathy:    He has no cervical adenopathy.  Neurological: He is alert and oriented to person, place, and time. No cranial nerve deficit.  No dysarthria, no aphasia  Skin: Skin is warm and dry. No rash noted. No erythema.  Psychiatric: He has a normal mood and affect. His behavior is normal. Judgment and thought content normal.    LABORATORY PANEL:   CBC Recent Labs  Lab 03/29/18 1731  WBC 10.4  HGB 12.6*  HCT 37.9*  PLT 226   ------------------------------------------------------------------------------------------------------------------  Chemistries  Recent Labs  Lab 03/29/18 1731  NA 137  K 4.7  CL 106  CO2 22  GLUCOSE 211*  BUN 18  CREATININE 1.17  CALCIUM 8.6*  AST 29  ALT 18   ALKPHOS 51  BILITOT 0.9   ------------------------------------------------------------------------------------------------------------------  Cardiac Enzymes Recent Labs  Lab 03/29/18 1731  TROPONINI <0.03   ------------------------------------------------------------------------------------------------------------------  RADIOLOGY:  Dg Chest 2 View  Result Date: 03/29/2018 CLINICAL DATA:  Chest pain increased shortness of breath over the past 3 days. Smoker. EXAM: CHEST - 2 VIEW COMPARISON:  10/03/2016. FINDINGS: Stable enlarged cardiac silhouette, small bilateral linear densities and prominent interstitial markings. The lungs remain hyperexpanded. Thoracic spine degenerative changes. IMPRESSION: No acute abnormality. Stable cardiomegaly, changes of COPD and bilateral linear scarring. Electronically Signed   By: Beckie Salts M.D.   On: 03/29/2018 19:42    EKG:   Orders placed or performed during the hospital encounter of 03/29/18  . EKG 12-Lead  . EKG 12-Lead  . ED EKG within 10 minutes  . ED EKG within 10 minutes    IMPRESSION AND PLAN:  Principal Problem:   Chest pain -trend cardiac enzymes, get an echocardiogram and a cardiology consult Active Problems:   (HFpEF) heart failure with preserved ejection fraction (HCC) -echocardiograms in our system including an chart review with care everywhere do not show reduced ejection fraction.  We will repeat echocardiogram as above, continue other heart failure medications.  Repeat dose of IV diuresis in the morning if needed, no edema seen on chest x-ray   Essential hypertension, benign -continue home meds   COPD (chronic obstructive pulmonary disease) (HCC) -continue home dose inhalers   Coronary atherosclerosis of native coronary artery -continue home medications   Prediabetes -glucose elevated here, check hemoglobin A1c, sliding scale insulin with corresponding glucose checks   Depression -home dose antidepressant   ANXIETY  DISORDER, GENERALIZED -Home dose anxiolytic   Chart review performed and case discussed with ED provider. Labs, imaging and/or ECG reviewed by provider and discussed with patient/family. Management plans discussed with the patient and/or family.  DVT PROPHYLAXIS: SubQ lovenox   GI PROPHYLAXIS:  None  ADMISSION STATUS: Observation  CODE STATUS: Full Code Status History    Date Active Date Inactive Code Status Order ID Comments User Context   02/25/2016 1439 02/26/2016 1903 Full Code [id number]  Standley Brooking, MD Inpatient   07/11/2015 2131 07/14/2015 1339 Full Code [id number]  Chilton Si, MD Inpatient   04/30/2015 0535 04/30/2015 1849 Full Code [id number]  Haydee Monica, MD Inpatient   10/22/2014 2315 10/23/2014 1059 Full Code [id number]  Haydee Monica, MD Inpatient   10/19/2014 0309 10/20/2014 2038 Full Code [id number]  Eduard Clos, MD Inpatient   09/19/2014 0047 09/19/2014 1655 Full Code [id number]  Pearson Grippe, MD Inpatient   08/08/2014 2134 08/12/2014 1428 Full Code [id number]  Wilson Singer, MD Inpatient   12/05/2013 0623 12/07/2013 1141 Full Code [id number]  Meredeth Ide, MD ED   09/11/2013 2307 09/14/2013 1440 Full Code [id number]  Meredeth Ide, MD Inpatient      TOTAL TIME TAKING CARE OF THIS PATIENT: 40 minutes.   Kayloni Rocco FIELDING 03/29/2018, 9:05 PM  [employer]  [phone]  CC: Primary care physician; Bubba Camp, MD  Note:  This document was prepared using Dragon voice recognition software and may include unintentional dictation errors.

## 2018-03-29 NOTE — Progress Notes (Signed)
Lovenox changed to 40 mg BID for CrCl >30 and BMI >40. 

## 2018-03-29 NOTE — ED Notes (Signed)
Pt is going to medical imaging.   

## 2018-03-29 NOTE — ED Notes (Signed)
Pt c/o chest pain and shortness of breath for 3 days - he states the pain is located over left breasts and is pressure that is constant - pt reports that he has chest pain when he goes into fluid overload

## 2018-03-30 ENCOUNTER — Emergency Department: Payer: Medicare Other

## 2018-03-30 ENCOUNTER — Encounter: Payer: Self-pay | Admitting: Emergency Medicine

## 2018-03-30 ENCOUNTER — Observation Stay: Admit: 2018-03-30 | Payer: Medicare Other

## 2018-03-30 ENCOUNTER — Other Ambulatory Visit: Payer: Self-pay

## 2018-03-30 ENCOUNTER — Emergency Department
Admission: EM | Admit: 2018-03-30 | Discharge: 2018-03-30 | Disposition: A | Discharge status: Home / Self Care | Payer: Medicare Other | Attending: Emergency Medicine | Admitting: Emergency Medicine

## 2018-03-30 DIAGNOSIS — Z951 Presence of aortocoronary bypass graft: Secondary | ICD-10-CM | POA: Insufficient documentation

## 2018-03-30 DIAGNOSIS — Z7982 Long term (current) use of aspirin: Secondary | ICD-10-CM | POA: Insufficient documentation

## 2018-03-30 DIAGNOSIS — I2510 Atherosclerotic heart disease of native coronary artery without angina pectoris: Secondary | ICD-10-CM | POA: Insufficient documentation

## 2018-03-30 DIAGNOSIS — J449 Chronic obstructive pulmonary disease, unspecified: Secondary | ICD-10-CM | POA: Diagnosis not present

## 2018-03-30 DIAGNOSIS — R079 Chest pain, unspecified: Secondary | ICD-10-CM | POA: Diagnosis not present

## 2018-03-30 DIAGNOSIS — F1010 Alcohol abuse, uncomplicated: Secondary | ICD-10-CM | POA: Diagnosis not present

## 2018-03-30 DIAGNOSIS — Z79899 Other long term (current) drug therapy: Secondary | ICD-10-CM | POA: Insufficient documentation

## 2018-03-30 DIAGNOSIS — I252 Old myocardial infarction: Secondary | ICD-10-CM | POA: Diagnosis not present

## 2018-03-30 DIAGNOSIS — I110 Hypertensive heart disease with heart failure: Secondary | ICD-10-CM | POA: Insufficient documentation

## 2018-03-30 DIAGNOSIS — F1410 Cocaine abuse, uncomplicated: Secondary | ICD-10-CM | POA: Diagnosis not present

## 2018-03-30 DIAGNOSIS — F329 Major depressive disorder, single episode, unspecified: Secondary | ICD-10-CM | POA: Insufficient documentation

## 2018-03-30 DIAGNOSIS — R0789 Other chest pain: Secondary | ICD-10-CM | POA: Diagnosis not present

## 2018-03-30 DIAGNOSIS — Z96642 Presence of left artificial hip joint: Secondary | ICD-10-CM | POA: Insufficient documentation

## 2018-03-30 DIAGNOSIS — Z87891 Personal history of nicotine dependence: Secondary | ICD-10-CM | POA: Diagnosis not present

## 2018-03-30 DIAGNOSIS — R0602 Shortness of breath: Secondary | ICD-10-CM | POA: Insufficient documentation

## 2018-03-30 DIAGNOSIS — I509 Heart failure, unspecified: Secondary | ICD-10-CM | POA: Diagnosis not present

## 2018-03-30 DIAGNOSIS — Z9049 Acquired absence of other specified parts of digestive tract: Secondary | ICD-10-CM | POA: Diagnosis not present

## 2018-03-30 DIAGNOSIS — F419 Anxiety disorder, unspecified: Secondary | ICD-10-CM | POA: Diagnosis not present

## 2018-03-30 LAB — BASIC METABOLIC PANEL
Anion gap: 9 (ref 5–15)
BUN: 16 mg/dL (ref 6–20)
CHLORIDE: 104 mmol/L (ref 98–111)
CO2: 26 mmol/L (ref 22–32)
Calcium: 8.4 mg/dL — ABNORMAL LOW (ref 8.9–10.3)
Creatinine, Ser: 0.96 mg/dL (ref 0.61–1.24)
GFR calc Af Amer: >60 mL/min (ref >60)
GFR calc non Af Amer: >60 mL/min (ref >60)
Glucose, Bld: 104 mg/dL — ABNORMAL HIGH (ref 70–99)
Potassium: 3.7 mmol/L (ref 3.5–5.1)
Sodium: 139 mmol/L (ref 135–145)

## 2018-03-30 LAB — CBC
HCT: 38.9 % — ABNORMAL LOW (ref 40.0–52.0)
Hemoglobin: 13 g/dL (ref 13.0–18.0)
MCH: 28.8 pg (ref 26.0–34.0)
MCHC: 33.3 g/dL (ref 32.0–36.0)
MCV: 86.6 fL (ref 80.0–100.0)
Platelets: 211 10*3/uL (ref 150–440)
RBC: 4.49 MIL/uL (ref 4.40–5.90)
RDW: 15.9 % — AB (ref 11.5–14.5)
WBC: 13.8 10*3/uL — ABNORMAL HIGH (ref 3.8–10.6)

## 2018-03-30 LAB — GLUCOSE, CAPILLARY: Glucose-Capillary: 139 mg/dL — ABNORMAL HIGH (ref 70–99)

## 2018-03-30 LAB — HEMOGLOBIN A1C
Hgb A1c MFr Bld: 5.9 % — ABNORMAL HIGH (ref 4.8–5.6)
Mean Plasma Glucose: 122.63 mg/dL

## 2018-03-30 LAB — MRSA PCR SCREENING: MRSA BY PCR: NEGATIVE

## 2018-03-30 LAB — TROPONIN I: Troponin I: <0.03 ng/mL (ref <0.03)

## 2018-03-30 MED ORDER — NITROGLYCERIN 0.4 MG SL SUBL
0.4000 mg | SUBLINGUAL_TABLET | SUBLINGUAL | Status: DC | PRN
  Filled 2018-03-30: qty 1

## 2018-03-30 MED ORDER — METHYLPREDNISOLONE SODIUM SUCC 40 MG IJ SOLR
40.0000 mg | Freq: Every day | INTRAMUSCULAR | Status: DC
  Filled 2018-03-30: qty 1

## 2018-03-30 MED ORDER — IPRATROPIUM-ALBUTEROL 0.5-2.5 (3) MG/3ML IN SOLN: 3.0000 mL | Freq: Four times a day (QID) | RESPIRATORY_TRACT | Status: DC

## 2018-03-30 MED ORDER — PREDNISONE 20 MG PO TABS: ORAL_TABLET | ORAL | 0 refills | Status: DC

## 2018-03-30 MED ORDER — PREDNISONE 20 MG PO TABS: 40.0000 mg | ORAL_TABLET | Freq: Every day | ORAL | Status: DC

## 2018-03-30 MED ORDER — FUROSEMIDE 10 MG/ML IJ SOLN
60.0000 mg | Freq: Once | INTRAMUSCULAR | Status: DC
  Filled 2018-03-30: qty 8

## 2018-03-30 NOTE — ED Notes (Signed)
Pt in the hallway. States he is leaving and going to [employer]. Pt was upset that he was getting nitro for pain. Pt had refused his nitro for pain. Pt ambulated in no distress with steady gait to the exit. Refused to stay and talk to dr.

## 2018-03-30 NOTE — ED Triage Notes (Signed)
Pt to ED with c/o SOB, pt was seen today and left AMA to go to a cancer center appt but did not make it. VSS , 98% on RA

## 2018-03-30 NOTE — Discharge Summary (Signed)
[facility] - [place] [facility]   PATIENT NAME: [name]    MR#:  [id number]  DATE OF BIRTH:  Jan 14, 1956  DATE OF ADMISSION:  03/29/2018 ADMITTING PHYSICIAN: Oralia Manis, MD  DATE OF signing out AGAINST MEDICAL ADVICE: 03/30/2018 10:54 AM  PRIMARY CARE PHYSICIAN: Bubba Camp, MD    ADMISSION DIAGNOSIS:  Chest pain, unspecified type [R07.9]  DISCHARGE DIAGNOSIS:  Principal Problem:   Chest pain Active Problems:   Depression   ANXIETY DISORDER, GENERALIZED   Essential hypertension, benign   COPD (chronic obstructive pulmonary disease) (HCC)   Coronary atherosclerosis of native coronary artery   Prediabetes   (HFpEF) heart failure with preserved ejection fraction (HCC)   SECONDARY DIAGNOSIS:   Past Medical History:  Diagnosis Date  . Anxiety disorder   . Arthritis   . CAD (coronary artery disease)    a. ~ 2000 cath at [facility];  b. 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  . CHF (congestive heart failure) (HCC)   . Chronic back pain   . Chronic hip pain   . COPD (chronic obstructive pulmonary disease) (HCC)   . Depression   . Essential hypertension, benign   . GERD (gastroesophageal reflux disease)   . Headache(784.0)   . History of alcohol abuse   . History of cocaine abuse   . Hyperlipidemia   . Obesity   . Pneumonia     HOSPITAL COURSE:   1.  Chest pain.  Cardiac enzymes negative.  Patient wanted to sign out AMA.  I thought I convinced him to stay but then he signed out AMA. 2.  COPD exacerbation.  I prescribed prednisone 40 mg daily for 4 days upon signing out against medical advice. 3.  Acute diastolic congestive heart failure.  Patient received IV Lasix.  He states his weight is still up at 342 and normally is 331.  Since the patient signed out AMA he can go back on his previous medications 4.   Essential hypertension can go back on his previous medications 5.  Impaired fasting glucose.  Hemoglobin A1c 5.9.  Patient not a diabetic at this time 6.  Morbid obesity weight loss needed  DISCHARGE CONDITIONS:  Patient signed out AGAINST MEDICAL ADVICE.  I did not believe the patient needed to be going home today I think we needed to still do more work here in the hospital since he was wheezing quite a bit.  CONSULTS OBTAINED:  Treatment Team:  Dolores Patty, MD End, Cristal Deer, MD  DRUG ALLERGIES:   Allergies  Allergen Reactions  . Ativan [Lorazepam] Other (See Comments)    "out of mind"  . Haloperidol Decanoate     DISCHARGE MEDICATIONS:   Allergies as of 03/30/2018      Reactions   Ativan [lorazepam] Other (See Comments)   "out of mind"   Haloperidol Decanoate       Medication List    TAKE these medications   ALPRAZolam 1 MG tablet Commonly known as:  XANAX Take 1 tablet (1 mg total) by mouth 3 (three) times daily as needed for anxiety. For anxiety What changed:  when to take this   amiodarone 200 MG tablet Commonly known as:  PACERONE Take 1 tablet by mouth daily.   amLODipine 10 MG tablet Commonly known as:  NORVASC Take 10  mg by mouth daily.   aspirin 81 MG tablet Take 1 tablet (81 mg total) by mouth daily.   atorvastatin 10 MG tablet Commonly known as:  LIPITOR TAKE (1) TABLET BY MOUTH ONCE DAILY.   SYMBICORT 160-4.5 MCG/ACT inhaler Generic drug:  budesonide-formoterol Inhale 2 Inhalers into the lungs 2 (two) times daily.   budesonide-formoterol 160-4.5 MCG/ACT inhaler Commonly known as:  SYMBICORT Inhale 2 puffs into the lungs 2 (two) times daily.   buPROPion 150 MG 24 hr tablet Commonly known as:  WELLBUTRIN XL Take 150 mg by mouth daily.   citalopram 10 MG tablet Commonly known as:  CELEXA Take 10 mg by mouth every morning.   doxepin 25 MG capsule Commonly known as:  SINEQUAN Take 25 mg by mouth at bedtime.   furosemide 40 MG  tablet Commonly known as:  LASIX Take 40 mg by mouth every morning.   gabapentin 300 MG capsule Commonly known as:  NEURONTIN Take 1 capsule by mouth 3 (three) times daily.   hydrochlorothiazide 25 MG tablet Commonly known as:  HYDRODIURIL Take 25 mg by mouth every morning.   ipratropium-albuterol 0.5-2.5 (3) MG/3ML Soln Commonly known as:  DUONEB Take 3 mLs by nebulization every 6 (six) hours as needed (shortness of breath/ wheezing.).   isosorbide mononitrate 30 MG 24 hr tablet Commonly known as:  IMDUR Take 1 tablet (30 mg total) by mouth 2 (two) times daily. What changed:  when to take this   meloxicam 15 MG tablet Commonly known as:  MOBIC Take 15 mg by mouth every morning.   metoprolol tartrate 25 MG tablet Commonly known as:  LOPRESSOR Take 1 tablet by mouth daily.   nitroGLYCERIN 0.4 MG SL tablet Commonly known as:  NITROSTAT Place 0.4 mg under the tongue every 5 (five) minutes as needed. For chest pain   omeprazole 20 MG capsule Commonly known as:  PRILOSEC Take 20 mg by mouth every morning.   ondansetron 4 MG tablet Commonly known as:  ZOFRAN Take 1 tablet by mouth 2 (two) times daily as needed.   oxyCODONE-acetaminophen 10-325 MG tablet Commonly known as:  PERCOCET Take 1 tablet by mouth 3 (three) times daily.   potassium chloride SA 20 MEQ tablet Commonly known as:  K-DUR,KLOR-CON Take 20 mEq by mouth every morning.   predniSONE 20 MG tablet Commonly known as:  DELTASONE Daily for four days What changed:    medication strength  how much to take  how to take this  when to take this  additional instructions   PROAIR HFA 108 (90 Base) MCG/ACT inhaler Generic drug:  albuterol Inhale 2 puffs into the lungs daily as needed for wheezing or shortness of breath.   ranolazine 500 MG 12 hr tablet Commonly known as:  RANEXA Take 1 tablet (500 mg total) by mouth 2 (two) times daily.   tamsulosin 0.4 MG Caps capsule Commonly known as:   FLOMAX Take 1 capsule by mouth daily.   tiotropium 18 MCG inhalation capsule Commonly known as:  SPIRIVA Place 18 mcg into inhaler and inhale daily.   VITAMIN E PO Take 1 tablet by mouth daily.   VOLTAREN 1 % Gel Generic drug:  diclofenac sodium Apply 2 g topically daily as needed (pain).        DISCHARGE INSTRUCTIONS:   Patient signed out AGAINST MEDICAL ADVICE  If you experience worsening of your admission symptoms, develop shortness of breath, life threatening emergency, suicidal or homicidal thoughts you must seek medical attention immediately by  calling 911 or calling your MD immediately  if symptoms less severe.  You Must read complete instructions/literature along with all the possible adverse reactions/side effects for all the Medicines you take and that have been prescribed to you. Take any new Medicines after you have completely understood and accept all the possible adverse reactions/side effects.   Please note  You were cared for by a hospitalist during your hospital stay. If you have any questions about your discharge medications or the care you received while you were in the hospital after you are discharged, you can call the unit and asked to speak with the hospitalist on call if the hospitalist that took care of you is not available. Once you are discharged, your primary care physician will handle any further medical issues. Please note that NO REFILLS for any discharge medications will be authorized once you are discharged, as it is imperative that you return to your primary care physician (or establish a relationship with a primary care physician if you do not have one) for your aftercare needs so that they can reassess your need for medications and monitor your lab values.    Today   CHIEF COMPLAINT:   Chief Complaint  Patient presents with  . Chest Pain    HISTORY OF PRESENT ILLNESS:  [name]  is a 62 y.o. male came in with chest pain.   VITAL  SIGNS:  Blood pressure 121/68, pulse 66, temperature (!) 97.4 F (36.3 C), temperature source Oral, resp. rate 18, height 6\' 3"  (1.905 m), weight (!) 157.7 kg, SpO2 97 %.   PHYSICAL EXAMINATION:  GENERAL:  62 y.o.-year-old patient lying in the bed with no acute distress.  EYES: Pupils equal, round, reactive to light and accommodation. No scleral icterus. Extraocular muscles intact.  HEENT: Head atraumatic, normocephalic. Oropharynx and nasopharynx clear.  NECK:  Supple, no jugular venous distention. No thyroid enlargement, no tenderness.  LUNGS: Normal breath sounds bilaterally, no wheezing, rales,rhonchi or crepitation. No use of accessory muscles of respiration.  CARDIOVASCULAR: S1, S2 normal. No murmurs, rubs, or gallops.  ABDOMEN: Soft, non-tender, non-distended. Bowel sounds present. No organomegaly or mass.  EXTREMITIES: No pedal edema, cyanosis, or clubbing.  NEUROLOGIC: Cranial nerves II through XII are intact. Muscle strength 5/5 in all extremities. Sensation intact. Gait not checked.  PSYCHIATRIC: The patient is alert and oriented x 3.  SKIN: No obvious rash, lesion, or ulcer.   DATA REVIEW:   CBC Recent Labs  Lab 03/30/18 0537  WBC 13.8*  HGB 13.0  HCT 38.9*  PLT 211    Chemistries  Recent Labs  Lab 03/29/18 1731 03/30/18 0537  NA 137 139  K 4.7 3.7  CL 106 104  CO2 22 26  GLUCOSE 211* 104*  BUN 18 16  CREATININE 1.17 0.96  CALCIUM 8.6* 8.4*  AST 29  --   ALT 18  --   ALKPHOS 51  --   BILITOT 0.9  --     Cardiac Enzymes Recent Labs  Lab 03/30/18 0537  TROPONINI <0.03    Microbiology Results  Results for orders placed or performed during the hospital encounter of 03/29/18  MRSA PCR Screening     Status: None   Collection Time: 03/30/18 12:27 AM  Result Value Ref Range Status   MRSA by PCR NEGATIVE NEGATIVE Final    Comment:        The GeneXpert MRSA Assay (FDA approved for NASAL specimens only), is one component of a comprehensive MRSA  colonization surveillance program. It is not intended to diagnose MRSA infection nor to guide or monitor treatment for MRSA infections. Performed at [facility], [address] [place], [place] [address]     RADIOLOGY:  Dg Chest 2 View  Result Date: 03/29/2018 CLINICAL DATA:  Chest pain increased shortness of breath over the past 3 days. Smoker. EXAM: CHEST - 2 VIEW COMPARISON:  10/03/2016. FINDINGS: Stable enlarged cardiac silhouette, small bilateral linear densities and prominent interstitial markings. The lungs remain hyperexpanded. Thoracic spine degenerative changes. IMPRESSION: No acute abnormality. Stable cardiomegaly, changes of COPD and bilateral linear scarring. Electronically Signed   By: Beckie Salts M.D.   On: 03/29/2018 19:42      Management plans discussed with the patient signed out AGAINST MEDICAL ADVICE and told me he is leaving after I thought I convinced him to stay.  CODE STATUS:     Code Status Orders  (From admission, onward)         Start     Ordered   03/29/18 2256  Full code  Continuous     03/29/18 2255        Code Status History    Date Active Date Inactive Code Status Order ID Comments User Context   02/25/2016 1439 02/26/2016 1903 Full Code [id number]  Standley Brooking, MD Inpatient   07/11/2015 2131 07/14/2015 1339 Full Code [id number]  Chilton Si, MD Inpatient   04/30/2015 0535 04/30/2015 1849 Full Code [id number]  Haydee Monica, MD Inpatient   10/22/2014 2315 10/23/2014 1059 Full Code [id number]  Haydee Monica, MD Inpatient   10/19/2014 0309 10/20/2014 2038 Full Code [id number]  Eduard Clos, MD Inpatient   09/19/2014 0047 09/19/2014 1655 Full Code [id number]  Pearson Grippe, MD Inpatient   08/08/2014 2134 08/12/2014 1428 Full Code [id number]  Wilson Singer, MD Inpatient   12/05/2013 0623 12/07/2013 1141 Full Code [id number]  Meredeth Ide, MD ED   09/11/2013 2307 09/14/2013 1440 Full Code [id number]  Meredeth Ide, MD Inpatient       TOTAL TIME TAKING CARE OF THIS PATIENT: 35 minutes.    Alford Highland M.D on 03/30/2018 at 11:08 AM  Between 7am to 6pm - Pager - [phone]  After 6pm go to [email or web address] - password [email or web address]  [employer] [facility] Office  [phone]  CC: Primary care physician; Bubba Camp, MD

## 2018-03-30 NOTE — ED Provider Notes (Signed)
[facility] Emergency Department Provider Note   ____________________________________________   I have reviewed the triage vital signs and the nursing notes.   HISTORY  Chief Complaint Shortness of Breath   History limited by: Not Limited   HPI [name] OESTREICHER Sr. is a 62 y.o. male who presents to the emergency department today because of continued shortness of breath.  The patient signed out AMA from the hospital earlier today where he was admitted for CHF exacerbation.  He states he had to go to a cancer appointment.  Patient states along with the shortness of breath he has having chest pain.  He states are giving him Dilaudid in the hospital and he would like that again.  The patient states he tried a nitroglycerin at home without any relief.  Patient has had swelling in his abdomen and legs.  He denies any fevers.   Per medical record review patient has a history of COPD, GERD  Past Medical History:  Diagnosis Date  . Anxiety disorder   . Arthritis   . CAD (coronary artery disease)    a. ~ 2000 cath at [facility];  b. 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  . CHF (congestive heart failure) (HCC)   . Chronic back pain   . Chronic hip pain   . COPD (chronic obstructive pulmonary disease) (HCC)   . Depression   . Essential hypertension, benign   . GERD (gastroesophageal reflux disease)   . Headache(784.0)   . History of alcohol abuse   . History of cocaine abuse   . Hyperlipidemia   . Obesity   . Pneumonia     Patient Active Problem List   Diagnosis Date Noted  . NSTEMI (non-ST elevated myocardial infarction) (HCC) 07/11/2015  . Chest pain 04/30/2015  . Syncope 04/30/2015  . Orthostatic syncope 04/30/2015  . Chronic back pain   . (HFpEF) heart failure with preserved ejection fraction (HCC)   . History of  cocaine abuse   . History of alcohol abuse   . Prediabetes 08/12/2014  . Tobacco abuse 09/21/2013  . Sleep apnea- C-pap intol 09/14/2013  . Coronary atherosclerosis of native coronary artery   . OBESITY, MORBID 05/29/2008  . Dyslipidemia 01/19/2007  . ANXIETY DISORDER, GENERALIZED 01/19/2007  . Depression 10/20/2006  . Essential hypertension, benign 10/20/2006  . COPD (chronic obstructive pulmonary disease) (HCC) 10/20/2006    Past Surgical History:  Procedure Laterality Date  . BACK SURGERY    . CHOLECYSTECTOMY    . CORONARY ARTERY BYPASS GRAFT    . HIP SURGERY     rt  . JOINT REPLACEMENT    . LEFT HEART CATHETERIZATION WITH CORONARY ANGIOGRAM N/A 09/12/2013   Procedure: LEFT HEART CATHETERIZATION WITH CORONARY ANGIOGRAM;  Surgeon: Kathleene Hazel, MD;  Location: [facility] CATH LAB;  Service: Cardiovascular;  Laterality: N/A;  . NASAL/SINUS ENDOSCOPY    . TOTAL HIP ARTHROPLASTY  05/08/2012   Procedure: TOTAL HIP ARTHROPLASTY;  Surgeon: Nestor Lewandowsky, MD;  Location: [facility] OR;  Service: Orthopedics;  Laterality: Left;  . TOTAL HIP ARTHROPLASTY  2014 per pt    Prior to Admission medications   Medication Sig Start Date End Date Taking? Authorizing Provider  ALPRAZolam Prudy Feeler) 1 MG tablet Take 1 tablet (1 mg total) by mouth 3 (three) times daily as needed for anxiety. For  anxiety Patient taking differently: Take 1 mg by mouth 3 (three) times daily. For anxiety 05/15/12   Kathlen Mody, MD  amiodarone (PACERONE) 200 MG tablet Take 1 tablet by mouth daily. 01/09/16   [provider]  amLODipine (NORVASC) 10 MG tablet Take 10 mg by mouth daily.    [provider]  aspirin 81 MG tablet Take 1 tablet (81 mg total) by mouth daily. 05/31/12   Kathlen Mody, MD  atorvastatin (LIPITOR) 10 MG tablet TAKE (1) TABLET BY MOUTH ONCE DAILY. 08/13/15   Jonelle Sidle, MD  budesonide-formoterol [facility]) 160-4.5 MCG/ACT inhaler Inhale 2 puffs into the lungs 2 (two) times daily.     [provider]  budesonide-formoterol (SYMBICORT) 160-4.5 MCG/ACT inhaler Inhale 2 Inhalers into the lungs 2 (two) times daily. 08/30/16   [provider]  buPROPion (WELLBUTRIN XL) 150 MG 24 hr tablet Take 150 mg by mouth daily.    [provider]  citalopram (CELEXA) 10 MG tablet Take 10 mg by mouth every morning.    [provider]  doxepin (SINEQUAN) 25 MG capsule Take 25 mg by mouth at bedtime.  08/06/14   [provider]  furosemide (LASIX) 40 MG tablet Take 40 mg by mouth every morning.    [provider]  gabapentin (NEURONTIN) 300 MG capsule Take 1 capsule by mouth 3 (three) times daily. 08/30/16   [provider]  hydrochlorothiazide (HYDRODIURIL) 25 MG tablet Take 25 mg by mouth every morning.  08/06/14   [provider]  ipratropium-albuterol (DUONEB) 0.5-2.5 (3) MG/3ML SOLN Take 3 mLs by nebulization every 6 (six) hours as needed (shortness of breath/ wheezing.).     [provider]  isosorbide mononitrate (IMDUR) 30 MG 24 hr tablet Take 1 tablet (30 mg total) by mouth 2 (two) times daily. Patient taking differently: Take 30 mg by mouth every morning.  08/12/14   Dhungel, Theda Belfast, MD  meloxicam (MOBIC) 15 MG tablet Take 15 mg by mouth every morning.  08/13/13   [provider]  metoprolol tartrate (LOPRESSOR) 25 MG tablet Take 1 tablet by mouth daily. 01/09/16   [provider]  nitroGLYCERIN (NITROSTAT) 0.4 MG SL tablet Place 0.4 mg under the tongue every 5 (five) minutes as needed. For chest pain    [provider]  omeprazole (PRILOSEC) 20 MG capsule Take 20 mg by mouth every morning.     [provider]  ondansetron (ZOFRAN) 4 MG tablet Take 1 tablet by mouth 2 (two) times daily as needed. 12/02/15   [provider]  oxyCODONE-acetaminophen (PERCOCET) 10-325 MG per tablet Take 1 tablet by mouth 3 (three) times daily.  09/30/14   [provider]  potassium  chloride SA (K-DUR,KLOR-CON) 20 MEQ tablet Take 20 mEq by mouth every morning.  08/13/13   [provider]  predniSONE (DELTASONE) 20 MG tablet Daily for four days 03/30/18   Alford Highland, MD  [facility] HFA 108 (90 BASE) MCG/ACT inhaler Inhale 2 puffs into the lungs daily as needed for wheezing or shortness of breath.  06/20/13   [provider]  ranolazine (RANEXA) 500 MG 12 hr tablet Take 1 tablet (500 mg total) by mouth 2 (two) times daily. Patient not taking: Reported on 08/31/2016 08/12/14   Dhungel, Theda Belfast, MD  tamsulosin (FLOMAX) 0.4 MG CAPS capsule Take 1 capsule by mouth daily. 01/09/16   [provider]  tiotropium (SPIRIVA) 18 MCG inhalation capsule Place 18 mcg into inhaler and inhale daily.  [provider]  VITAMIN E PO Take 1 tablet by mouth daily.    [provider]  VOLTAREN 1 % GEL Apply 2 g topically daily as needed (pain).  02/27/15   [provider]    Allergies Ativan [lorazepam] and Haloperidol decanoate  Family History  Problem Relation Age of Onset  . Heart attack Mother   . Heart attack Brother   . Heart attack Sister     Social History Social History   Tobacco Use  . Smoking status: Former Smoker    Packs/day: 0.50    Years: 39.00    Pack years: 19.50    Types: Cigarettes    Last attempt to quit: 09/15/2015    Years since quitting: 2.5  . Smokeless tobacco: Never Used  Substance Use Topics  . Alcohol use: Yes    Alcohol/week: 0.0 standard drinks    Comment: socially; reports drinking 2 beers last nigh  . Drug use: No    Review of Systems Constitutional: No fever/chills Eyes: No visual changes. ENT: No sore throat. Cardiovascular: Positive for chest pain. Respiratory: Positive for shortness of breath. Gastrointestinal: No abdominal pain.  Positive for abdominal swelling.  Genitourinary: Negative for dysuria. Musculoskeletal: Positive for leg swelling. Skin: Negative for rash. Neurological:  Negative for headaches, focal weakness or numbness.  ____________________________________________   PHYSICAL EXAM:  VITAL SIGNS: ED Triage Vitals  Enc Vitals Group     BP 03/30/18 1854 (!) 128/100     Pulse Rate 03/30/18 1852 78     Resp 03/30/18 1852 18     Temp 03/30/18 1852 97.9 F (36.6 C)     Temp Source 03/30/18 1852 Oral     SpO2 03/30/18 1852 98 %     Weight --      Height --      Head Circumference --      Peak Flow --      Pain Score 03/30/18 1852 8   Constitutional: Alert and oriented.  Eyes: Conjunctivae are normal.  ENT      Head: Normocephalic and atraumatic.      Nose: No congestion/rhinnorhea.      Mouth/Throat: Mucous membranes are moist.      Neck: No stridor. Hematological/Lymphatic/Immunilogical: No cervical lymphadenopathy. Cardiovascular: Normal rate, regular rhythm.  No murmurs, rubs, or gallops.  Respiratory: Slightly increased respiratory effort. Poor air movement diffusely. Gastrointestinal: Soft and non tender. No rebound. No guarding.  Genitourinary: Deferred Musculoskeletal: Normal range of motion in all extremities. 1+ bilateral edema. Neurologic:  Normal speech and language. No gross focal neurologic deficits are appreciated.  Skin:  Skin is warm, dry and intact. No rash noted. Psychiatric: Mood and affect are normal. Speech and behavior are normal. Patient exhibits appropriate insight and judgment.  ____________________________________________    LABS (pertinent positives/negatives)  None  ____________________________________________   EKG  I, Phineas Semen, attending physician, personally viewed and interpreted this EKG  EKG Time: 1849 Rate: 76 Rhythm: normal sinus rhythm Axis: normal Intervals: qtc 456 QRS: narrow ST changes: no st elevation Impression: normal ekg   ____________________________________________    RADIOLOGY  CXR Stable cardiomegaly. No active  disease  ____________________________________________   PROCEDURES  Procedures  ____________________________________________   INITIAL IMPRESSION / ASSESSMENT AND PLAN / ED COURSE  Pertinent labs & imaging results that were available during my care of the patient were reviewed by me and considered in my medical decision making (see chart for details).   Patient presented to the emergency department with  continued shortness of breath and chest pain.  Patient left AMA from the hospital earlier today.  My exam patient does have somewhat poor diffuse air movement.  Does have bilateral peripheral edema.  Discussed with patient that we would check blood work give IV Lasix and attempt to control the chest pain.  Apparently after the x-ray was performed and prior to getting IV Lasix or blood work drawn patient left the emergency department.   ____________________________________________   FINAL CLINICAL IMPRESSION(S) / ED DIAGNOSES  Final diagnoses:  Shortness of breath  Nonspecific chest pain     Note: This dictation was prepared with Dragon dictation. Any transcriptional errors that result from this process are unintentional     Phineas Semen, MD 03/30/18 2034

## 2018-03-30 NOTE — Progress Notes (Signed)
Patient stating that he wants to leave because he has a dentist appointment, advised patient to try to reschedule appointment or wait for MD to round, patient persistent to leave. MD notified and will see the pt.

## 2018-03-30 NOTE — ED Notes (Signed)
FIRST NURSE NOTE:  Pt states he left the hospital AMA today because he had an appt at the cancer center. Pt states he is short of breath and has fluid on his lungs.

## 2018-03-30 NOTE — Progress Notes (Signed)
Chaplain responded to an OR for prayer on routine rounds. Pt was anxious. He was found in the hallway telling staff he was leaving. Chaplain escorted him back to his room and offered prayer and calming spirit. Pt was adamant about leaving to get to an appointment in [place] and had other stops before getting there. He asked chaplain to escort him to ED where his car is. Chaplain said he could do that without discharge. Pt walked out the room with the chaplain and talked to nurse tech ,who  Called the nurse. Chaplain left to nurses care.    03/30/18 1000  Clinical Encounter Type  Visited With Patient  Visit Type Initial;Spiritual support  Referral From Nurse  Spiritual Encounters  Spiritual Needs Prayer

## 2018-03-30 NOTE — Progress Notes (Signed)
Patient persistent to leave AMA despite MD counseling, AMA paper signed, patient left building.

## 2018-03-31 LAB — HIV ANTIBODY (ROUTINE TESTING W REFLEX): HIV SCREEN 4TH GENERATION: NONREACTIVE

## 2018-04-03 ENCOUNTER — Encounter: Payer: Self-pay | Admitting: *Deleted

## 2018-04-03 ENCOUNTER — Emergency Department: Payer: Medicare Other

## 2018-04-03 ENCOUNTER — Emergency Department
Admission: EM | Admit: 2018-04-03 | Discharge: 2018-04-03 | Disposition: A | Discharge status: Home / Self Care | Payer: Medicare Other | Attending: Emergency Medicine | Admitting: Emergency Medicine

## 2018-04-03 ENCOUNTER — Other Ambulatory Visit: Payer: Self-pay

## 2018-04-03 DIAGNOSIS — Z96642 Presence of left artificial hip joint: Secondary | ICD-10-CM | POA: Insufficient documentation

## 2018-04-03 DIAGNOSIS — Z951 Presence of aortocoronary bypass graft: Secondary | ICD-10-CM | POA: Insufficient documentation

## 2018-04-03 DIAGNOSIS — I509 Heart failure, unspecified: Secondary | ICD-10-CM | POA: Insufficient documentation

## 2018-04-03 DIAGNOSIS — Z87891 Personal history of nicotine dependence: Secondary | ICD-10-CM | POA: Diagnosis not present

## 2018-04-03 DIAGNOSIS — R0602 Shortness of breath: Secondary | ICD-10-CM

## 2018-04-03 DIAGNOSIS — I110 Hypertensive heart disease with heart failure: Secondary | ICD-10-CM | POA: Diagnosis not present

## 2018-04-03 DIAGNOSIS — J449 Chronic obstructive pulmonary disease, unspecified: Secondary | ICD-10-CM | POA: Diagnosis not present

## 2018-04-03 DIAGNOSIS — I2510 Atherosclerotic heart disease of native coronary artery without angina pectoris: Secondary | ICD-10-CM | POA: Insufficient documentation

## 2018-04-03 LAB — BASIC METABOLIC PANEL
Anion gap: 10 (ref 5–15)
BUN: 17 mg/dL (ref 6–20)
CHLORIDE: 106 mmol/L (ref 98–111)
CO2: 21 mmol/L — AB (ref 22–32)
CREATININE: 1.06 mg/dL (ref 0.61–1.24)
Calcium: 8.6 mg/dL — ABNORMAL LOW (ref 8.9–10.3)
GFR calc Af Amer: >60 mL/min (ref >60)
GFR calc non Af Amer: >60 mL/min (ref >60)
Glucose, Bld: 196 mg/dL — ABNORMAL HIGH (ref 70–99)
Potassium: 4.1 mmol/L (ref 3.5–5.1)
Sodium: 137 mmol/L (ref 135–145)

## 2018-04-03 LAB — BRAIN NATRIURETIC PEPTIDE: B Natriuretic Peptide: 56 pg/mL (ref 0.0–100.0)

## 2018-04-03 LAB — CBC
HCT: 38.9 % — ABNORMAL LOW (ref 40.0–52.0)
Hemoglobin: 13.2 g/dL (ref 13.0–18.0)
MCH: 29.1 pg (ref 26.0–34.0)
MCHC: 33.8 g/dL (ref 32.0–36.0)
MCV: 86 fL (ref 80.0–100.0)
PLATELETS: 227 10*3/uL (ref 150–440)
RBC: 4.53 MIL/uL (ref 4.40–5.90)
RDW: 15.9 % — AB (ref 11.5–14.5)
WBC: 11.2 10*3/uL — ABNORMAL HIGH (ref 3.8–10.6)

## 2018-04-03 LAB — TROPONIN I: Troponin I: <0.03 ng/mL (ref <0.03)

## 2018-04-03 MED ORDER — PANTOPRAZOLE SODIUM 40 MG PO TBEC
40.00 | DELAYED_RELEASE_TABLET | ORAL | Status: DC
Start: 2018-04-01 — End: 2018-04-03

## 2018-04-03 MED ORDER — TRAZODONE HCL 50 MG PO TABS
50.00 | ORAL_TABLET | ORAL | Status: DC
Start: ? — End: 2018-04-03

## 2018-04-03 MED ORDER — BUDESONIDE-FORMOTEROL FUMARATE 160-4.5 MCG/ACT IN AERO
2.00 | INHALATION_SPRAY | RESPIRATORY_TRACT | Status: DC
Start: 2018-04-01 — End: 2018-04-03

## 2018-04-03 MED ORDER — PREDNISONE 20 MG PO TABS
40.00 | ORAL_TABLET | ORAL | Status: DC
Start: 2018-04-01 — End: 2018-04-03

## 2018-04-03 MED ORDER — FUROSEMIDE 10 MG/ML IJ SOLN
80.0000 mg | Freq: Once | INTRAMUSCULAR | Status: AC
  Administered 2018-04-03: 80 mg via INTRAVENOUS
  Filled 2018-04-03: qty 8

## 2018-04-03 MED ORDER — ASPIRIN EC 81 MG PO TBEC
81.00 | DELAYED_RELEASE_TABLET | ORAL | Status: DC
Start: 2018-04-01 — End: 2018-04-03

## 2018-04-03 MED ORDER — FLUTICASONE PROPIONATE 50 MCG/ACT NA SUSP
2.00 | NASAL | Status: DC
Start: 2018-04-01 — End: 2018-04-03

## 2018-04-03 MED ORDER — BUPROPION HCL ER (SR) 150 MG PO TB12
150.00 | ORAL_TABLET | ORAL | Status: DC
Start: 2018-04-01 — End: 2018-04-03

## 2018-04-03 MED ORDER — OXYCODONE-ACETAMINOPHEN 5-325 MG PO TABS
2.00 | ORAL_TABLET | ORAL | Status: DC
Start: ? — End: 2018-04-03

## 2018-04-03 MED ORDER — FUROSEMIDE 10 MG/ML IJ SOLN
80.00 | INTRAMUSCULAR | Status: DC
Start: 2018-04-01 — End: 2018-04-03

## 2018-04-03 MED ORDER — METHIMAZOLE 10 MG PO TABS
20.00 | ORAL_TABLET | ORAL | Status: DC
Start: 2018-04-01 — End: 2018-04-03

## 2018-04-03 MED ORDER — ISOSORBIDE MONONITRATE ER 60 MG PO TB24
60.00 | ORAL_TABLET | ORAL | Status: DC
Start: 2018-04-01 — End: 2018-04-03

## 2018-04-03 MED ORDER — ALBUTEROL SULFATE HFA 108 (90 BASE) MCG/ACT IN AERS
2.00 | INHALATION_SPRAY | RESPIRATORY_TRACT | Status: DC
Start: ? — End: 2018-04-03

## 2018-04-03 MED ORDER — CARVEDILOL 3.125 MG PO TABS
3.13 | ORAL_TABLET | ORAL | Status: DC
Start: 2018-04-01 — End: 2018-04-03

## 2018-04-03 MED ORDER — SODIUM CHLORIDE 0.9 % IJ SOLN
5.00 | INTRAMUSCULAR | Status: DC
Start: 2018-04-01 — End: 2018-04-03

## 2018-04-03 MED ORDER — BISACODYL 5 MG PO TBEC
5.00 | DELAYED_RELEASE_TABLET | ORAL | Status: DC
Start: 2018-04-01 — End: 2018-04-03

## 2018-04-03 MED ORDER — OLOPATADINE HCL 0.2 % OP SOLN
1.00 | OPHTHALMIC | Status: DC
Start: 2018-04-01 — End: 2018-04-03

## 2018-04-03 MED ORDER — MORPHINE SULFATE (PF) 4 MG/ML IV SOLN
4.0000 mg | Freq: Once | INTRAVENOUS | Status: AC
  Administered 2018-04-03: 4 mg via INTRAVENOUS
  Filled 2018-04-03: qty 1

## 2018-04-03 MED ORDER — POTASSIUM CHLORIDE CRYS ER 20 MEQ PO TBCR
40.00 | EXTENDED_RELEASE_TABLET | ORAL | Status: DC
Start: 2018-04-01 — End: 2018-04-03

## 2018-04-03 MED ORDER — MELATONIN 3 MG PO TABS
3.00 | ORAL_TABLET | ORAL | Status: DC
Start: 2018-04-01 — End: 2018-04-03

## 2018-04-03 MED ORDER — CITALOPRAM HYDROBROMIDE 10 MG PO TABS
10.00 | ORAL_TABLET | ORAL | Status: DC
Start: 2018-04-01 — End: 2018-04-03

## 2018-04-03 MED ORDER — DOXEPIN HCL 25 MG PO CAPS
25.00 | ORAL_CAPSULE | ORAL | Status: DC
Start: ? — End: 2018-04-03

## 2018-04-03 NOTE — ED Notes (Signed)
Pt states that he has had SHOB since last night and not been able to catch his breath. No distress noted and respirations are equal.

## 2018-04-03 NOTE — ED Triage Notes (Signed)
Pt to triage via wheelchair.  Pt reports sob since last night.  Pt also chest tightness.  Nausea today.  No vomiting.  Pt alert.  Pt discharged from [facility] last week.

## 2018-04-03 NOTE — ED Provider Notes (Signed)
[facility] Emergency Department Provider Note       Time seen: ----------------------------------------- 8:02 PM on 04/03/2018 -----------------------------------------   I have reviewed the triage vital signs and the nursing notes.  HISTORY   Chief Complaint Shortness of Breath    HPI [name] TROMPETER Sr. is a 62 y.o. male with a history of anxiety, arthritis, coronary artery disease, CHF, chronic pain, COPD, hyperlipidemia and obesity who presents to the ED for shortness of breath since last night.  Patient also has some chest tightness with nausea but no vomiting.  He was discharged from the hospital recently for shortness of breath.  States he took an extra Lasix today but still feels volume overloaded, thinks he has about 5 pounds of fluid extra.  Past Medical History:  Diagnosis Date  . Anxiety disorder   . Arthritis   . CAD (coronary artery disease)    a. ~ 2000 cath at [facility];  b. 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  . CHF (congestive heart failure) (HCC)   . Chronic back pain   . Chronic hip pain   . COPD (chronic obstructive pulmonary disease) (HCC)   . Depression   . Essential hypertension, benign   . GERD (gastroesophageal reflux disease)   . Headache(784.0)   . History of alcohol abuse   . History of cocaine abuse   . Hyperlipidemia   . Obesity   . Pneumonia     Patient Active Problem List   Diagnosis Date Noted  . NSTEMI (non-ST elevated myocardial infarction) (HCC) 07/11/2015  . Chest pain 04/30/2015  . Syncope 04/30/2015  . Orthostatic syncope 04/30/2015  . Chronic back pain   . (HFpEF) heart failure with preserved ejection fraction (HCC)   . History of cocaine abuse   . History of alcohol abuse   . Prediabetes 08/12/2014  . Tobacco abuse 09/21/2013  . Sleep apnea- C-pap intol  09/14/2013  . Coronary atherosclerosis of native coronary artery   . OBESITY, MORBID 05/29/2008  . Dyslipidemia 01/19/2007  . ANXIETY DISORDER, GENERALIZED 01/19/2007  . Depression 10/20/2006  . Essential hypertension, benign 10/20/2006  . COPD (chronic obstructive pulmonary disease) (HCC) 10/20/2006    Past Surgical History:  Procedure Laterality Date  . BACK SURGERY    . CHOLECYSTECTOMY    . CORONARY ARTERY BYPASS GRAFT    . HIP SURGERY     rt  . JOINT REPLACEMENT    . LEFT HEART CATHETERIZATION WITH CORONARY ANGIOGRAM N/A 09/12/2013   Procedure: LEFT HEART CATHETERIZATION WITH CORONARY ANGIOGRAM;  Surgeon: Kathleene Hazel, MD;  Location: [facility] CATH LAB;  Service: Cardiovascular;  Laterality: N/A;  . NASAL/SINUS ENDOSCOPY    . TOTAL HIP ARTHROPLASTY  05/08/2012   Procedure: TOTAL HIP ARTHROPLASTY;  Surgeon: Nestor Lewandowsky, MD;  Location: [facility] OR;  Service: Orthopedics;  Laterality: Left;  . TOTAL HIP ARTHROPLASTY  2014 per pt    Allergies Ativan [lorazepam] and Haloperidol decanoate  Social History Social History   Tobacco Use  . Smoking status: Former Smoker    Packs/day: 0.50    Years: 39.00    Pack years: 19.50    Types: Cigarettes    Last attempt to quit: 09/15/2015    Years since quitting: 2.5  . Smokeless tobacco: Never Used  Substance Use Topics  . Alcohol use: Yes  Alcohol/week: 0.0 standard drinks    Comment: socially; reports drinking 2 beers last nigh  . Drug use: No   Review of Systems Constitutional: Negative for fever. Cardiovascular: Positive for chest pain Respiratory: Positive for shortness of breath Gastrointestinal: Negative for abdominal pain, vomiting and diarrhea. Musculoskeletal: Negative for back pain. Skin: Negative for rash. Neurological: Negative for headaches, focal weakness or numbness.  All systems negative/normal/unremarkable except as stated in the HPI  ____________________________________________   PHYSICAL EXAM:  VITAL  SIGNS: ED Triage Vitals  Enc Vitals Group     BP 04/03/18 1945 (!) 145/76     Pulse Rate 04/03/18 1945 80     Resp 04/03/18 1945 (!) 22     Temp 04/03/18 1945 98.4 F (36.9 C)     Temp Source 04/03/18 1945 Oral     SpO2 04/03/18 1945 96 %     Weight 04/03/18 1943 (!) 347 lb (157.4 kg)     Height 04/03/18 1943 6\' 3"  (1.905 m)     Head Circumference --      Peak Flow --      Pain Score 04/03/18 1943 8     Pain Loc --      Pain Edu? --      Excl. in GC? --    Constitutional: Alert and oriented. Well appearing and in no distress. Eyes: Conjunctivae are normal. Normal extraocular movements. ENT   Head: Normocephalic and atraumatic.   Nose: No congestion/rhinnorhea.   Mouth/Throat: Mucous membranes are moist.   Neck: No stridor. Cardiovascular: Normal rate, regular rhythm. No murmurs, rubs, or gallops. Respiratory: Normal respiratory effort without tachypnea nor retractions. Breath sounds are clear and equal bilaterally. No wheezes/rales/rhonchi. Gastrointestinal: Soft and nontender. Normal bowel sounds Musculoskeletal: Nontender with normal range of motion in extremities. No lower extremity tenderness nor edema. Neurologic:  Normal speech and language. No gross focal neurologic deficits are appreciated.  Skin:  Skin is warm, dry and intact. No rash noted. Psychiatric: Mood and affect are normal. Speech and behavior are normal.  ____________________________________________  EKG: Interpreted by me.  Sinus rhythm rate 81 bpm, normal PR interval, possible anterior infarct age-indeterminate, normal QT  ____________________________________________  ED COURSE:  As part of my medical decision making, I reviewed the following data within the electronic MEDICAL RECORD NUMBER History obtained from family if available, nursing notes, old chart and ekg, as well as notes from prior ED visits. Patient presented for shortness of breath, we will assess with labs and imaging as indicated at  this time.   Procedures ____________________________________________   LABS (pertinent positives/negatives)  Labs Reviewed  BASIC METABOLIC PANEL - Abnormal; Notable for the following components:      Result Value   CO2 21 (*)    Glucose, Bld 196 (*)    Calcium 8.6 (*)    All other components within normal limits  CBC - Abnormal; Notable for the following components:   WBC 11.2 (*)    HCT 38.9 (*)    RDW 15.9 (*)    All other components within normal limits  TROPONIN I  BRAIN NATRIURETIC PEPTIDE    RADIOLOGY  Chest x-ray IMPRESSION: No active cardiopulmonary disease.  Mild cardiomegaly.  ____________________________________________  DIFFERENTIAL DIAGNOSIS   CHF, anxiety, COPD, pneumonia  FINAL ASSESSMENT AND PLAN  CHF exacerbation   Plan: The patient had presented for shortness of breath. Patient's labs did not reveal any acute process. Patient's imaging was negative.  Likely represents diastolic heart failure with a component of  anxiety although I am not able to find a recent echocardiogram.  He has diuresed well here and is cleared for outpatient follow-up with the heart failure clinic.   Ulice Dash, MD   Note: This note was generated in part or whole with voice recognition software. Voice recognition is usually quite accurate but there are transcription errors that can and very often do occur. I apologize for any typographical errors that were not detected and corrected.     Emily Filbert, MD 04/03/18 2209

## 2018-04-06 ENCOUNTER — Telehealth: Payer: Self-pay

## 2018-04-06 NOTE — Telephone Encounter (Signed)
Patient asked to call back in a few weeks as he has a lot of appointment right now.

## 2018-04-11 ENCOUNTER — Emergency Department
Admission: EM | Admit: 2018-04-11 | Discharge: 2018-04-12 | Discharge status: Against Medical Advice | Payer: Medicare Other | Attending: Emergency Medicine | Admitting: Emergency Medicine

## 2018-04-11 DIAGNOSIS — Z5321 Procedure and treatment not carried out due to patient leaving prior to being seen by health care provider: Secondary | ICD-10-CM | POA: Insufficient documentation

## 2018-04-11 DIAGNOSIS — Z139 Encounter for screening, unspecified: Secondary | ICD-10-CM

## 2018-04-11 DIAGNOSIS — Z0000 Encounter for general adult medical examination without abnormal findings: Secondary | ICD-10-CM | POA: Diagnosis not present

## 2018-04-11 DIAGNOSIS — Z5329 Procedure and treatment not carried out because of patient's decision for other reasons: Secondary | ICD-10-CM

## 2018-04-11 DIAGNOSIS — Z5320 Procedure and treatment not carried out because of patient's decision for unspecified reasons: Secondary | ICD-10-CM

## 2018-04-12 NOTE — ED Notes (Signed)
Pt refuses to answer triage questions and states that he does not want to be seen. Pt denies SI, HI or other symptoms. Pt refuses to get his vitals taken.

## 2018-04-12 NOTE — ED Provider Notes (Signed)
[facility] Emergency Department Provider Note    First MD Initiated Contact with Patient 04/12/18 0014     (approximate)  I have reviewed the triage vital signs and the nursing notes.   HISTORY  Chief Complaint No chief complaint on file.    HPI [name] DUCEY Sr. is a 62 y.o. male with below list of chronic medical conditions presents to the emergency department via EMS.  Patient refused care stating "I do not want to be here".  Patient states that his ex wife is the one who called.  I asked the patient why his ex-wife called he said "because she is crazy and she drinks a case of beer a day".  Patient refuses all care at this time.  Past Medical History:  Diagnosis Date  . Anxiety disorder   . Arthritis   . CAD (coronary artery disease)    a. ~ 2000 cath at [facility];  b. 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  . CHF (congestive heart failure) (HCC)   . Chronic back pain   . Chronic hip pain   . COPD (chronic obstructive pulmonary disease) (HCC)   . Depression   . Essential hypertension, benign   . GERD (gastroesophageal reflux disease)   . Headache(784.0)   . History of alcohol abuse   . History of cocaine abuse   . Hyperlipidemia   . Obesity   . Pneumonia     Patient Active Problem List   Diagnosis Date Noted  . NSTEMI (non-ST elevated myocardial infarction) (HCC) 07/11/2015  . Chest pain 04/30/2015  . Syncope 04/30/2015  . Orthostatic syncope 04/30/2015  . Chronic back pain   . (HFpEF) heart failure with preserved ejection fraction (HCC)   . History of cocaine abuse   . History of alcohol abuse   . Prediabetes 08/12/2014  . Tobacco abuse 09/21/2013  . Sleep apnea- C-pap intol 09/14/2013  . Coronary atherosclerosis of native coronary artery   . OBESITY, MORBID 05/29/2008  . Dyslipidemia 01/19/2007   . ANXIETY DISORDER, GENERALIZED 01/19/2007  . Depression 10/20/2006  . Essential hypertension, benign 10/20/2006  . COPD (chronic obstructive pulmonary disease) (HCC) 10/20/2006    Past Surgical History:  Procedure Laterality Date  . BACK SURGERY    . CHOLECYSTECTOMY    . CORONARY ARTERY BYPASS GRAFT    . HIP SURGERY     rt  . JOINT REPLACEMENT    . LEFT HEART CATHETERIZATION WITH CORONARY ANGIOGRAM N/A 09/12/2013   Procedure: LEFT HEART CATHETERIZATION WITH CORONARY ANGIOGRAM;  Surgeon: Kathleene Hazel, MD;  Location: [facility] CATH LAB;  Service: Cardiovascular;  Laterality: N/A;  . NASAL/SINUS ENDOSCOPY    . TOTAL HIP ARTHROPLASTY  05/08/2012   Procedure: TOTAL HIP ARTHROPLASTY;  Surgeon: Nestor Lewandowsky, MD;  Location: [facility] OR;  Service: Orthopedics;  Laterality: Left;  . TOTAL HIP ARTHROPLASTY  2014 per pt    Prior to Admission medications   Medication Sig Start Date End Date Taking? Authorizing Provider  ALPRAZolam Prudy Feeler) 1 MG tablet Take 1 tablet (1 mg total) by mouth 3 (three) times daily as needed for anxiety. For anxiety Patient taking differently: Take 1 mg by mouth 3 (three) times daily. For anxiety 05/15/12   Kathlen Mody, MD  amiodarone (PACERONE) 200 MG tablet Take 1 tablet by mouth daily. 01/09/16  [provider]  amLODipine (NORVASC) 10 MG tablet Take 10 mg by mouth daily.    [provider]  aspirin 81 MG tablet Take 1 tablet (81 mg total) by mouth daily. 05/31/12   Kathlen Mody, MD  atorvastatin (LIPITOR) 10 MG tablet TAKE (1) TABLET BY MOUTH ONCE DAILY. 08/13/15   Jonelle Sidle, MD  budesonide-formoterol [facility]) 160-4.5 MCG/ACT inhaler Inhale 2 puffs into the lungs 2 (two) times daily.    [provider]  budesonide-formoterol (SYMBICORT) 160-4.5 MCG/ACT inhaler Inhale 2 Inhalers into the lungs 2 (two) times daily. 08/30/16   [provider]  buPROPion (WELLBUTRIN XL) 150 MG 24 hr tablet Take 150 mg by mouth daily.     [provider]  citalopram (CELEXA) 10 MG tablet Take 10 mg by mouth every morning.    [provider]  doxepin (SINEQUAN) 25 MG capsule Take 25 mg by mouth at bedtime.  08/06/14   [provider]  furosemide (LASIX) 40 MG tablet Take 40 mg by mouth every morning.    [provider]  gabapentin (NEURONTIN) 300 MG capsule Take 1 capsule by mouth 3 (three) times daily. 08/30/16   [provider]  hydrochlorothiazide (HYDRODIURIL) 25 MG tablet Take 25 mg by mouth every morning.  08/06/14   [provider]  ipratropium-albuterol (DUONEB) 0.5-2.5 (3) MG/3ML SOLN Take 3 mLs by nebulization every 6 (six) hours as needed (shortness of breath/ wheezing.).     [provider]  isosorbide mononitrate (IMDUR) 30 MG 24 hr tablet Take 1 tablet (30 mg total) by mouth 2 (two) times daily. Patient taking differently: Take 30 mg by mouth every morning.  08/12/14   Dhungel, Theda Belfast, MD  meloxicam (MOBIC) 15 MG tablet Take 15 mg by mouth every morning.  08/13/13   [provider]  metoprolol tartrate (LOPRESSOR) 25 MG tablet Take 1 tablet by mouth daily. 01/09/16   [provider]  nitroGLYCERIN (NITROSTAT) 0.4 MG SL tablet Place 0.4 mg under the tongue every 5 (five) minutes as needed. For chest pain    [provider]  omeprazole (PRILOSEC) 20 MG capsule Take 20 mg by mouth every morning.     [provider]  ondansetron (ZOFRAN) 4 MG tablet Take 1 tablet by mouth 2 (two) times daily as needed. 12/02/15   [provider]  oxyCODONE-acetaminophen (PERCOCET) 10-325 MG per tablet Take 1 tablet by mouth 3 (three) times daily.  09/30/14   [provider]  potassium chloride SA (K-DUR,KLOR-CON) 20 MEQ tablet Take 20 mEq by mouth every morning.  08/13/13   [provider]  predniSONE (DELTASONE) 20 MG tablet Daily for four days 03/30/18   Alford Highland, MD  [facility] HFA 108 (90 BASE) MCG/ACT inhaler Inhale  2 puffs into the lungs daily as needed for wheezing or shortness of breath.  06/20/13   [provider]  ranolazine (RANEXA) 500 MG 12 hr tablet Take 1 tablet (500 mg total) by mouth 2 (two) times daily. Patient not taking: Reported on 08/31/2016 08/12/14   Dhungel, Theda Belfast, MD  tamsulosin (FLOMAX) 0.4 MG CAPS capsule Take 1 capsule by mouth daily. 01/09/16   [provider]  tiotropium (SPIRIVA) 18 MCG inhalation capsule Place 18 mcg into inhaler and inhale daily.    [provider]  VITAMIN E PO Take 1 tablet by mouth daily.    [provider]  VOLTAREN 1 % GEL Apply 2 g topically daily as needed (pain).  02/27/15  [provider]    Allergies Ativan [lorazepam] and Haloperidol decanoate  Family History  Problem Relation Age of Onset  . Heart attack Mother   . Heart attack Brother   . Heart attack Sister     Social History Social History   Tobacco Use  . Smoking status: Former Smoker    Packs/day: 0.50    Years: 39.00    Pack years: 19.50    Types: Cigarettes    Last attempt to quit: 09/15/2015    Years since quitting: 2.5  . Smokeless tobacco: Never Used  Substance Use Topics  . Alcohol use: Yes    Alcohol/week: 0.0 standard drinks    Comment: socially; reports drinking 2 beers last nigh  . Drug use: No    Review of Systems Constitutional: No fever/chills Eyes: No visual changes. ENT: No sore throat. Cardiovascular: Denies chest pain. Respiratory: Denies shortness of breath. Gastrointestinal: No abdominal pain.  No nausea, no vomiting.  No diarrhea.  No constipation. Genitourinary: Negative for dysuria. Musculoskeletal: Negative for neck pain.  Negative for back pain. Integumentary: Negative for rash. Neurological: Negative for headaches, focal weakness or numbness.   ____________________________________________   PHYSICAL EXAM:  VITAL SIGNS: ED Triage Vitals  Enc Vitals Group     BP      Pulse      Resp       Temp      Temp src      SpO2      Weight      Height      Head Circumference      Peak Flow      Pain Score      Pain Loc      Pain Edu?      Excl. in GC?     Constitutional: Alert and oriented. Well appearing and in no acute distress. Eyes: Conjunctivae are normal.  Head: Atraumatic. Neck: No stridor.   Cardiovascular: Normal rate, regular rhythm. Good peripheral circulation. Grossly normal heart sounds. Respiratory: Normal respiratory effort.  No retractions. Lungs CTAB. Gastrointestinal: Soft and nontender. No distention.  Musculoskeletal: No lower extremity tenderness nor edema. No gross deformities of extremities. Neurologic:  Normal speech and language. No gross focal neurologic deficits are appreciated.  Skin:  Skin is warm, dry and intact. No rash noted.     Procedures   ____________________________________________   INITIAL IMPRESSION / ASSESSMENT AND PLAN / ED COURSE  As part of my medical decision making, I reviewed the following data within the electronic MEDICAL RECORD NUMBER   62 year old male presenting to the emergency department via EMS with no complaints.  Patient refused all care.  Patient states that he has no way to get home and is requesting EMS taken back to his home.  Patient is alert and oriented following commands ambulating without difficulty in no apparent distress.  Patient was given a cab voucher. ____________________________________________  FINAL CLINICAL IMPRESSION(S) / ED DIAGNOSES  Final diagnoses:  Left against medical advice  Encounter for medical screening examination     MEDICATIONS GIVEN DURING THIS VISIT:  Medications - No data to display   ED Discharge Orders    None       Note:  This document was prepared using Dragon voice recognition software and may include unintentional dictation errors.    Darci Current, MD 04/12/18 [phone]

## 2018-04-21 ENCOUNTER — Observation Stay
Admission: EM | Admit: 2018-04-21 | Discharge: 2018-04-23 | Disposition: A | Discharge status: Home / Self Care | Payer: Medicare Other | Attending: Internal Medicine | Admitting: Internal Medicine

## 2018-04-21 ENCOUNTER — Emergency Department: Payer: Medicare Other

## 2018-04-21 ENCOUNTER — Encounter: Payer: Self-pay | Admitting: Emergency Medicine

## 2018-04-21 DIAGNOSIS — R0602 Shortness of breath: Secondary | ICD-10-CM | POA: Diagnosis present

## 2018-04-21 DIAGNOSIS — Z888 Allergy status to other drugs, medicaments and biological substances status: Secondary | ICD-10-CM | POA: Diagnosis not present

## 2018-04-21 DIAGNOSIS — I2510 Atherosclerotic heart disease of native coronary artery without angina pectoris: Secondary | ICD-10-CM | POA: Insufficient documentation

## 2018-04-21 DIAGNOSIS — M1990 Unspecified osteoarthritis, unspecified site: Secondary | ICD-10-CM | POA: Diagnosis not present

## 2018-04-21 DIAGNOSIS — Z87891 Personal history of nicotine dependence: Secondary | ICD-10-CM | POA: Diagnosis not present

## 2018-04-21 DIAGNOSIS — Z951 Presence of aortocoronary bypass graft: Secondary | ICD-10-CM | POA: Diagnosis not present

## 2018-04-21 DIAGNOSIS — Z6841 Body Mass Index (BMI) 40.0 and over, adult: Secondary | ICD-10-CM | POA: Diagnosis not present

## 2018-04-21 DIAGNOSIS — F411 Generalized anxiety disorder: Secondary | ICD-10-CM | POA: Diagnosis not present

## 2018-04-21 DIAGNOSIS — Z79899 Other long term (current) drug therapy: Secondary | ICD-10-CM | POA: Diagnosis not present

## 2018-04-21 DIAGNOSIS — E785 Hyperlipidemia, unspecified: Secondary | ICD-10-CM | POA: Insufficient documentation

## 2018-04-21 DIAGNOSIS — Z8249 Family history of ischemic heart disease and other diseases of the circulatory system: Secondary | ICD-10-CM | POA: Diagnosis not present

## 2018-04-21 DIAGNOSIS — I700 Atherosclerosis of aorta: Secondary | ICD-10-CM | POA: Insufficient documentation

## 2018-04-21 DIAGNOSIS — J441 Chronic obstructive pulmonary disease with (acute) exacerbation: Principal | ICD-10-CM | POA: Insufficient documentation

## 2018-04-21 DIAGNOSIS — I5030 Unspecified diastolic (congestive) heart failure: Secondary | ICD-10-CM | POA: Insufficient documentation

## 2018-04-21 DIAGNOSIS — Z7982 Long term (current) use of aspirin: Secondary | ICD-10-CM | POA: Diagnosis not present

## 2018-04-21 DIAGNOSIS — I110 Hypertensive heart disease with heart failure: Secondary | ICD-10-CM | POA: Diagnosis not present

## 2018-04-21 DIAGNOSIS — K219 Gastro-esophageal reflux disease without esophagitis: Secondary | ICD-10-CM | POA: Insufficient documentation

## 2018-04-21 DIAGNOSIS — Z96642 Presence of left artificial hip joint: Secondary | ICD-10-CM | POA: Insufficient documentation

## 2018-04-21 DIAGNOSIS — I252 Old myocardial infarction: Secondary | ICD-10-CM | POA: Insufficient documentation

## 2018-04-21 DIAGNOSIS — F329 Major depressive disorder, single episode, unspecified: Secondary | ICD-10-CM | POA: Insufficient documentation

## 2018-04-21 DIAGNOSIS — R079 Chest pain, unspecified: Secondary | ICD-10-CM

## 2018-04-21 LAB — BASIC METABOLIC PANEL
Anion gap: 7 (ref 5–15)
BUN: 14 mg/dL (ref 6–20)
CO2: 24 mmol/L (ref 22–32)
Calcium: 8.7 mg/dL — ABNORMAL LOW (ref 8.9–10.3)
Chloride: 108 mmol/L (ref 98–111)
Creatinine, Ser: 1.08 mg/dL (ref 0.61–1.24)
GFR calc non Af Amer: >60 mL/min (ref >60)
Glucose, Bld: 129 mg/dL — ABNORMAL HIGH (ref 70–99)
POTASSIUM: 3.8 mmol/L (ref 3.5–5.1)
Sodium: 139 mmol/L (ref 135–145)

## 2018-04-21 LAB — TROPONIN I
Troponin I: <0.03 ng/mL (ref <0.03)
Troponin I: <0.03 ng/mL (ref <0.03)

## 2018-04-21 LAB — CBC
HCT: 38.7 % — ABNORMAL LOW (ref 40.0–52.0)
Hemoglobin: 12.9 g/dL — ABNORMAL LOW (ref 13.0–18.0)
MCH: 28.9 pg (ref 26.0–34.0)
MCHC: 33.4 g/dL (ref 32.0–36.0)
MCV: 86.4 fL (ref 80.0–100.0)
PLATELETS: 237 10*3/uL (ref 150–440)
RBC: 4.47 MIL/uL (ref 4.40–5.90)
RDW: 17.1 % — AB (ref 11.5–14.5)
WBC: 9.6 10*3/uL (ref 3.8–10.6)

## 2018-04-21 MED ORDER — FUROSEMIDE 10 MG/ML IJ SOLN
40.0000 mg | Freq: Once | INTRAMUSCULAR | Status: AC
  Administered 2018-04-21: 40 mg via INTRAVENOUS
  Filled 2018-04-21: qty 4

## 2018-04-21 MED ORDER — POTASSIUM CHLORIDE CRYS ER 20 MEQ PO TBCR
20.0000 meq | EXTENDED_RELEASE_TABLET | Freq: Every morning | ORAL | Status: DC
  Administered 2018-04-22 – 2018-04-23 (×2): 20 meq via ORAL
  Filled 2018-04-21 (×2): qty 1

## 2018-04-21 MED ORDER — MORPHINE SULFATE (PF) 2 MG/ML IV SOLN
2.0000 mg | INTRAVENOUS | Status: DC | PRN
  Administered 2018-04-22 – 2018-04-23 (×8): 2 mg via INTRAVENOUS
  Filled 2018-04-21 (×8): qty 1

## 2018-04-21 MED ORDER — ISOSORBIDE MONONITRATE ER 30 MG PO TB24
30.0000 mg | ORAL_TABLET | Freq: Every morning | ORAL | Status: DC
  Administered 2018-04-22: 30 mg via ORAL
  Filled 2018-04-21: qty 1

## 2018-04-21 MED ORDER — METOPROLOL TARTRATE 25 MG PO TABS
25.0000 mg | ORAL_TABLET | Freq: Every day | ORAL | Status: DC
  Administered 2018-04-22 – 2018-04-23 (×2): 25 mg via ORAL
  Filled 2018-04-21 (×2): qty 1

## 2018-04-21 MED ORDER — TAMSULOSIN HCL 0.4 MG PO CAPS
0.4000 mg | ORAL_CAPSULE | Freq: Every day | ORAL | Status: DC
  Administered 2018-04-22 – 2018-04-23 (×2): 0.4 mg via ORAL
  Filled 2018-04-21 (×2): qty 1

## 2018-04-21 MED ORDER — DICLOFENAC SODIUM 1 % TD GEL
2.0000 g | Freq: Every day | TRANSDERMAL | Status: DC | PRN
  Filled 2018-04-21: qty 100

## 2018-04-21 MED ORDER — FUROSEMIDE 40 MG PO TABS
40.0000 mg | ORAL_TABLET | Freq: Every morning | ORAL | Status: DC
  Administered 2018-04-22 – 2018-04-23 (×2): 40 mg via ORAL
  Filled 2018-04-21 (×2): qty 1

## 2018-04-21 MED ORDER — ENOXAPARIN SODIUM 40 MG/0.4ML ~~LOC~~ SOLN
40.0000 mg | SUBCUTANEOUS | Status: DC
  Administered 2018-04-21 – 2018-04-22 (×2): 40 mg via SUBCUTANEOUS
  Filled 2018-04-21 (×2): qty 0.4

## 2018-04-21 MED ORDER — ATORVASTATIN CALCIUM 20 MG PO TABS
40.0000 mg | ORAL_TABLET | Freq: Every day | ORAL | Status: DC
  Administered 2018-04-22: 40 mg via ORAL
  Filled 2018-04-21: qty 2

## 2018-04-21 MED ORDER — TIOTROPIUM BROMIDE MONOHYDRATE 18 MCG IN CAPS
18.0000 ug | ORAL_CAPSULE | Freq: Every day | RESPIRATORY_TRACT | Status: DC
  Filled 2018-04-21: qty 5

## 2018-04-21 MED ORDER — ACETAMINOPHEN 650 MG RE SUPP: 650.0000 mg | Freq: Four times a day (QID) | RECTAL | Status: DC | PRN

## 2018-04-21 MED ORDER — METHYLPREDNISOLONE SODIUM SUCC 40 MG IJ SOLR
40.0000 mg | INTRAMUSCULAR | Status: AC
  Administered 2018-04-21: 40 mg via INTRAVENOUS
  Filled 2018-04-21: qty 1

## 2018-04-21 MED ORDER — PANTOPRAZOLE SODIUM 40 MG PO TBEC
40.0000 mg | DELAYED_RELEASE_TABLET | Freq: Every day | ORAL | Status: DC
  Administered 2018-04-22 – 2018-04-23 (×2): 40 mg via ORAL
  Filled 2018-04-21 (×2): qty 1

## 2018-04-21 MED ORDER — DOXEPIN HCL 25 MG PO CAPS
25.0000 mg | ORAL_CAPSULE | Freq: Every day | ORAL | Status: DC
  Administered 2018-04-21: 25 mg via ORAL
  Filled 2018-04-21 (×3): qty 1

## 2018-04-21 MED ORDER — ALPRAZOLAM 0.5 MG PO TABS
1.0000 mg | ORAL_TABLET | Freq: Three times a day (TID) | ORAL | Status: DC | PRN
  Administered 2018-04-21 – 2018-04-22 (×2): 1 mg via ORAL
  Filled 2018-04-21 (×2): qty 2

## 2018-04-21 MED ORDER — ASPIRIN EC 81 MG PO TBEC
81.0000 mg | DELAYED_RELEASE_TABLET | Freq: Every day | ORAL | Status: DC
  Administered 2018-04-22 – 2018-04-23 (×2): 81 mg via ORAL
  Filled 2018-04-21 (×2): qty 1

## 2018-04-21 MED ORDER — GI COCKTAIL ~~LOC~~
30.0000 mL | Freq: Once | ORAL | Status: AC
  Administered 2018-04-22: 30 mL via ORAL
  Filled 2018-04-21: qty 30

## 2018-04-21 MED ORDER — ASPIRIN EC 325 MG PO TBEC
325.0000 mg | DELAYED_RELEASE_TABLET | Freq: Once | ORAL | Status: AC
  Administered 2018-04-21: 325 mg via ORAL
  Filled 2018-04-21: qty 1

## 2018-04-21 MED ORDER — BUDESONIDE 0.5 MG/2ML IN SUSP
0.5000 mg | Freq: Two times a day (BID) | RESPIRATORY_TRACT | Status: DC
  Administered 2018-04-21 – 2018-04-23 (×3): 0.5 mg via RESPIRATORY_TRACT
  Filled 2018-04-21 (×6): qty 2

## 2018-04-21 MED ORDER — ONDANSETRON HCL 4 MG PO TABS: 4.0000 mg | ORAL_TABLET | Freq: Two times a day (BID) | ORAL | Status: DC | PRN

## 2018-04-21 MED ORDER — BUPROPION HCL ER (XL) 150 MG PO TB24
150.0000 mg | ORAL_TABLET | Freq: Every day | ORAL | Status: DC
  Administered 2018-04-22 – 2018-04-23 (×2): 150 mg via ORAL
  Filled 2018-04-21 (×2): qty 1

## 2018-04-21 MED ORDER — NITROGLYCERIN 0.4 MG SL SUBL: 0.4000 mg | SUBLINGUAL_TABLET | SUBLINGUAL | Status: DC | PRN

## 2018-04-21 MED ORDER — ONDANSETRON HCL 4 MG/2ML IJ SOLN
4.0000 mg | Freq: Once | INTRAMUSCULAR | Status: AC
  Administered 2018-04-21: 4 mg via INTRAVENOUS
  Filled 2018-04-21: qty 2

## 2018-04-21 MED ORDER — GABAPENTIN 300 MG PO CAPS
300.0000 mg | ORAL_CAPSULE | Freq: Three times a day (TID) | ORAL | Status: DC
  Administered 2018-04-21 – 2018-04-23 (×5): 300 mg via ORAL
  Filled 2018-04-21 (×5): qty 1

## 2018-04-21 MED ORDER — METHYLPREDNISOLONE SODIUM SUCC 40 MG IJ SOLR
40.0000 mg | Freq: Two times a day (BID) | INTRAMUSCULAR | Status: DC
  Administered 2018-04-22 – 2018-04-23 (×3): 40 mg via INTRAVENOUS
  Filled 2018-04-21 (×3): qty 1

## 2018-04-21 MED ORDER — AMIODARONE HCL 200 MG PO TABS
200.0000 mg | ORAL_TABLET | Freq: Every day | ORAL | Status: DC
  Administered 2018-04-22 – 2018-04-23 (×2): 200 mg via ORAL
  Filled 2018-04-21 (×2): qty 1

## 2018-04-21 MED ORDER — CITALOPRAM HYDROBROMIDE 10 MG PO TABS
10.0000 mg | ORAL_TABLET | Freq: Every morning | ORAL | Status: DC
  Administered 2018-04-22 – 2018-04-23 (×2): 10 mg via ORAL
  Filled 2018-04-21 (×2): qty 1

## 2018-04-21 MED ORDER — OXYCODONE-ACETAMINOPHEN 5-325 MG PO TABS: 1.0000 | ORAL_TABLET | Freq: Four times a day (QID) | ORAL | Status: DC | PRN

## 2018-04-21 MED ORDER — MORPHINE SULFATE (PF) 4 MG/ML IV SOLN
4.0000 mg | Freq: Once | INTRAVENOUS | Status: AC
  Administered 2018-04-21: 4 mg via INTRAVENOUS
  Filled 2018-04-21: qty 1

## 2018-04-21 MED ORDER — IPRATROPIUM-ALBUTEROL 0.5-2.5 (3) MG/3ML IN SOLN
3.0000 mL | Freq: Four times a day (QID) | RESPIRATORY_TRACT | Status: DC
  Administered 2018-04-21 – 2018-04-22 (×3): 3 mL via RESPIRATORY_TRACT
  Filled 2018-04-21 (×3): qty 3

## 2018-04-21 MED ORDER — OXYCODONE HCL 5 MG PO TABS
5.0000 mg | ORAL_TABLET | Freq: Four times a day (QID) | ORAL | Status: DC | PRN
  Administered 2018-04-22: 5 mg via ORAL
  Filled 2018-04-21: qty 1

## 2018-04-21 MED ORDER — ACETAMINOPHEN 325 MG PO TABS: 650.0000 mg | ORAL_TABLET | Freq: Four times a day (QID) | ORAL | Status: DC | PRN

## 2018-04-21 NOTE — ED Triage Notes (Signed)
Pt to ED with c/o of chest pain that started Monday night. Pt states short of breath and light headed.

## 2018-04-21 NOTE — ED Notes (Signed)
Pt to first nurse to inquire about waiting time. RN told him that there are 2 longest waits ahead of him at this time.

## 2018-04-21 NOTE — ED Provider Notes (Signed)
[facility] Emergency Department Provider Note ____________________________________________   First MD Initiated Contact with Patient 04/21/18 1853     (approximate)  I have reviewed the triage vital signs and the nursing notes.   HISTORY  Chief Complaint Chest Pain  HPI [name] SUMMERLIN Sr. is a 62 y.o. male with a history of CAD, anxiety, CHF, COPD and obesity was presented to the emergency department with chest pain.  He says that the pressure-like pain in the left side of his chest is an 8 out of 10 to a 10 out of 10 and worsened with exertion.  He says that he is also experiencing shortness of breath with exertion and numbness to his left hand and radiation of the pain down his left arm.  He says that he is a history of a CABG and sees a cardiologist at [facility].  He says that he is taking nitro at home without relief.  Past Medical History:  Diagnosis Date  . Anxiety disorder   . Arthritis   . CAD (coronary artery disease)    a. ~ 2000 cath at [facility];  b. 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  . CHF (congestive heart failure) (HCC)   . Chronic back pain   . Chronic hip pain   . COPD (chronic obstructive pulmonary disease) (HCC)   . Depression   . Essential hypertension, benign   . GERD (gastroesophageal reflux disease)   . Headache(784.0)   . History of alcohol abuse   . History of cocaine abuse   . Hyperlipidemia   . Obesity   . Pneumonia     Patient Active Problem List   Diagnosis Date Noted  . NSTEMI (non-ST elevated myocardial infarction) (HCC) 07/11/2015  . Chest pain 04/30/2015  . Syncope 04/30/2015  . Orthostatic syncope 04/30/2015  . Chronic back pain   . (HFpEF) heart failure with preserved ejection fraction (HCC)   . History of cocaine abuse   . History of alcohol abuse   . Prediabetes  08/12/2014  . Tobacco abuse 09/21/2013  . Sleep apnea- C-pap intol 09/14/2013  . Coronary atherosclerosis of native coronary artery   . OBESITY, MORBID 05/29/2008  . Dyslipidemia 01/19/2007  . ANXIETY DISORDER, GENERALIZED 01/19/2007  . Depression 10/20/2006  . Essential hypertension, benign 10/20/2006  . COPD (chronic obstructive pulmonary disease) (HCC) 10/20/2006    Past Surgical History:  Procedure Laterality Date  . BACK SURGERY    . CHOLECYSTECTOMY    . CORONARY ARTERY BYPASS GRAFT    . HIP SURGERY     rt  . JOINT REPLACEMENT    . LEFT HEART CATHETERIZATION WITH CORONARY ANGIOGRAM N/A 09/12/2013   Procedure: LEFT HEART CATHETERIZATION WITH CORONARY ANGIOGRAM;  Surgeon: Kathleene Hazel, MD;  Location: [facility];  Service: Cardiovascular;  Laterality: N/A;  . NASAL/SINUS ENDOSCOPY    . TOTAL HIP ARTHROPLASTY  05/08/2012   Procedure: TOTAL HIP ARTHROPLASTY;  Surgeon: Nestor Lewandowsky, MD;  Location: [facility] OR;  Service: Orthopedics;  Laterality: Left;  . TOTAL HIP ARTHROPLASTY  2014 per pt    Prior to Admission medications   Medication Sig Start Date End Date Taking? Authorizing Provider  ALPRAZolam Prudy Feeler) 1 MG tablet Take 1 tablet (1 mg total) by mouth 3 (three) times daily as needed for anxiety. For anxiety Patient taking  differently: Take 1 mg by mouth 3 (three) times daily. For anxiety 05/15/12   Kathlen Mody, MD  amiodarone (PACERONE) 200 MG tablet Take 1 tablet by mouth daily. 01/09/16   [provider]  amLODipine (NORVASC) 10 MG tablet Take 10 mg by mouth daily.    [provider]  aspirin 81 MG tablet Take 1 tablet (81 mg total) by mouth daily. 05/31/12   Kathlen Mody, MD  atorvastatin (LIPITOR) 10 MG tablet TAKE (1) TABLET BY MOUTH ONCE DAILY. 08/13/15   Jonelle Sidle, MD  budesonide-formoterol [facility]) 160-4.5 MCG/ACT inhaler Inhale 2 puffs into the lungs 2 (two) times daily.    [provider]  budesonide-formoterol (SYMBICORT)  160-4.5 MCG/ACT inhaler Inhale 2 Inhalers into the lungs 2 (two) times daily. 08/30/16   [provider]  buPROPion (WELLBUTRIN XL) 150 MG 24 hr tablet Take 150 mg by mouth daily.    [provider]  citalopram (CELEXA) 10 MG tablet Take 10 mg by mouth every morning.    [provider]  doxepin (SINEQUAN) 25 MG capsule Take 25 mg by mouth at bedtime.  08/06/14   [provider]  furosemide (LASIX) 40 MG tablet Take 40 mg by mouth every morning.    [provider]  gabapentin (NEURONTIN) 300 MG capsule Take 1 capsule by mouth 3 (three) times daily. 08/30/16   [provider]  hydrochlorothiazide (HYDRODIURIL) 25 MG tablet Take 25 mg by mouth every morning.  08/06/14   [provider]  ipratropium-albuterol (DUONEB) 0.5-2.5 (3) MG/3ML SOLN Take 3 mLs by nebulization every 6 (six) hours as needed (shortness of breath/ wheezing.).     [provider]  isosorbide mononitrate (IMDUR) 30 MG 24 hr tablet Take 1 tablet (30 mg total) by mouth 2 (two) times daily. Patient taking differently: Take 30 mg by mouth every morning.  08/12/14   Dhungel, Theda Belfast, MD  meloxicam (MOBIC) 15 MG tablet Take 15 mg by mouth every morning.  08/13/13   [provider]  metoprolol tartrate (LOPRESSOR) 25 MG tablet Take 1 tablet by mouth daily. 01/09/16   [provider]  nitroGLYCERIN (NITROSTAT) 0.4 MG SL tablet Place 0.4 mg under the tongue every 5 (five) minutes as needed. For chest pain    [provider]  omeprazole (PRILOSEC) 20 MG capsule Take 20 mg by mouth every morning.     [provider]  ondansetron (ZOFRAN) 4 MG tablet Take 1 tablet by mouth 2 (two) times daily as needed. 12/02/15   [provider]  oxyCODONE-acetaminophen (PERCOCET) 10-325 MG per tablet Take 1 tablet by mouth 3 (three) times daily.  09/30/14   [provider]  potassium chloride SA (K-DUR,KLOR-CON) 20 MEQ tablet Take 20 mEq by  mouth every morning.  08/13/13   [provider]  predniSONE (DELTASONE) 20 MG tablet Daily for four days 03/30/18   Alford Highland, MD  [facility] HFA 108 (90 BASE) MCG/ACT inhaler Inhale 2 puffs into the lungs daily as needed for wheezing or shortness of breath.  06/20/13   [provider]  ranolazine (RANEXA) 500 MG 12 hr tablet Take 1 tablet (500 mg total) by mouth 2 (two) times daily. Patient not taking: Reported on 08/31/2016 08/12/14   Dhungel, Theda Belfast, MD  tamsulosin (FLOMAX) 0.4 MG CAPS capsule Take 1 capsule by mouth daily. 01/09/16   [provider]  tiotropium (SPIRIVA) 18 MCG inhalation capsule Place 18 mcg into inhaler and inhale daily.    [provider]  VITAMIN E PO Take 1 tablet by mouth daily.    [provider]  VOLTAREN 1 % GEL Apply 2 g topically daily as needed (pain).  02/27/15   [provider]    Allergies Ativan [lorazepam] and Haloperidol decanoate  Family History  Problem Relation Age of Onset  . Heart attack Mother   . Heart attack Brother   . Heart attack Sister     Social History Social History   Tobacco Use  . Smoking status: Former Smoker    Packs/day: 0.50    Years: 39.00    Pack years: 19.50    Types: Cigarettes    Last attempt to quit: 09/15/2015    Years since quitting: 2.6  . Smokeless tobacco: Never Used  Substance Use Topics  . Alcohol use: Yes    Alcohol/week: 0.0 standard drinks    Comment: socially; reports drinking 2 beers last nigh  . Drug use: No    Review of Systems  Constitutional: No fever/chills Eyes: No visual changes. ENT: No sore throat. Cardiovascular: As above Respiratory: As above Gastrointestinal: No abdominal pain.  No nausea, no vomiting.  No diarrhea.  No constipation. Genitourinary: Negative for dysuria. Musculoskeletal: Negative for back pain. Skin: Negative for rash. Neurological: Negative for headaches, focal weakness or  numbness.   ____________________________________________   PHYSICAL EXAM:  VITAL SIGNS: ED Triage Vitals  Enc Vitals Group     BP 04/21/18 1715 136/66     Pulse Rate 04/21/18 1715 69     Resp 04/21/18 1715 18     Temp 04/21/18 1715 97.6 F (36.4 C)     Temp Source 04/21/18 1715 Oral     SpO2 04/21/18 1715 95 %     Weight --      Height --      Head Circumference --      Peak Flow --      Pain Score 04/21/18 1719 7     Pain Loc --      Pain Edu? --      Excl. in GC? --     Constitutional: Alert and oriented. Well appearing and in no acute distress. Eyes: Conjunctivae are normal.  Head: Atraumatic. Nose: No congestion/rhinnorhea. Mouth/Throat: Mucous membranes are moist.  Neck: No stridor.   Cardiovascular: Normal rate, regular rhythm. Grossly normal heart sounds.  Respiratory: Normal respiratory effort.  No retractions. Lungs CTAB. Gastrointestinal: Soft and nontender. No distention Musculoskeletal: No lower extremity tenderness nor edema.  No joint effusions. Neurologic:  Normal speech and language. No gross focal neurologic deficits are appreciated. Skin:  Skin is warm, dry and intact. No rash noted. Psychiatric: Mood and affect are normal. Speech and behavior are normal.  ____________________________________________   LABS (all labs ordered are listed, but only abnormal results are displayed)  Labs Reviewed  BASIC METABOLIC PANEL - Abnormal; Notable for the following components:      Result Value   Glucose, Bld 129 (*)    Calcium 8.7 (*)    All other components within normal limits  CBC - Abnormal; Notable for the following components:   Hemoglobin 12.9 (*)    HCT 38.7 (*)    RDW 17.1 (*)    All other components within normal limits  TROPONIN I   ____________________________________________  EKG  ED ECG REPORT I, Arelia Longest, the attending physician, personally viewed and interpreted this ECG.   Date: 04/21/2018  EKG Time: 1714  Rate: 74   Rhythm: normal sinus rhythm  Axis: Normal  Intervals:Incomplete right bundle branch block  ST&T Change: Borderline EKG.    ____________________________________________  RADIOLOGY  Trace pulmonary edema on the chest x-ray ____________________________________________   PROCEDURES  Procedure(s) performed:   Procedures  Critical Care performed:   ____________________________________________   INITIAL IMPRESSION / ASSESSMENT AND PLAN / ED COURSE  Pertinent labs & imaging results that were available during my care of the patient were reviewed by me and considered in my medical decision making (see chart for details).  Differential diagnosis includes, but is not limited to, ACS, aortic dissection, pulmonary embolism, cardiac tamponade, pneumothorax, pneumonia, pericarditis, myocarditis, GI-related causes including esophagitis/gastritis, and musculoskeletal chest wall pain.   As part of my medical decision making, I reviewed the following data within the electronic MEDICAL RECORD NUMBER Notes from prior ED visits  ----------------------------------------- 7:44 PM on 04/21/2018 -----------------------------------------  Patient with multiple recent visits to this emergency department also multiple risk factors for cardiovascular disease and known cardiovascular disease.  I believe that his presentation will merit observation especially since that his chest x-ray appears slightly worse from previous.  He will be admitted to the hospital.  Signed out to Dr. Fonnie Birkenhead.  The patient is understanding of the treatment plan and willing to comply. ____________________________________________   FINAL CLINICAL IMPRESSION(S) / ED DIAGNOSES  Chest pain.  Shortness of breath.  NEW MEDICATIONS STARTED DURING THIS VISIT:  New Prescriptions   No medications on file     Note:  This document was prepared using Dragon voice recognition software and may include unintentional dictation errors.      Myrna Blazer, MD 04/21/18 [phone]

## 2018-04-21 NOTE — H&P (Signed)
[facility] [place] [facility]   PATIENT NAME: [name]    MR#:  [id number]  DATE OF BIRTH:  1956/04/04  DATE OF ADMISSION:  04/21/2018  PRIMARY CARE PHYSICIAN: Bubba Camp, MD   REQUESTING/REFERRING PHYSICIAN: Dr Langston Masker  CHIEF COMPLAINT:   Chief Complaint  Patient presents with  . Chest Pain    HISTORY OF PRESENT ILLNESS:  [name]  is a 62 y.o. male with a known history of CAD presents with chest pain on and off since Monday.  Described as chest pressure in the center of his chest.  He states he has had 11 surgeries on his chest.  Pressure graded 6-7 out of 10 intensity.  Associated with left arm numbness and tingling.  When he walks he short of breath.  He states he has had a little fever and chills.  He states his abdomen is a little more swollen.  The last time I had him in the hospital he signed out AGAINST MEDICAL ADVICE.  Also was in the ER a few times since then and left the ER.  Hospitalist services were contacted for evaluation.  First troponin negative.  PAST MEDICAL HISTORY:   Past Medical History:  Diagnosis Date  . Anxiety disorder   . Arthritis   . CAD (coronary artery disease)    a. ~ 2000 cath at [facility];  b. 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  . CHF (congestive heart failure) (HCC)   . Chronic back pain   . Chronic hip pain   . COPD (chronic obstructive pulmonary disease) (HCC)   . Depression   . Essential hypertension, benign   . GERD (gastroesophageal reflux disease)   . Headache(784.0)   . History of alcohol abuse   . History of cocaine abuse   . Hyperlipidemia   . Obesity   . Pneumonia     PAST SURGICAL HISTORY:   Past Surgical History:  Procedure Laterality Date  . BACK SURGERY    . CHOLECYSTECTOMY    . CORONARY ARTERY BYPASS GRAFT    . HIP SURGERY      rt  . JOINT REPLACEMENT    . LEFT HEART CATHETERIZATION WITH CORONARY ANGIOGRAM N/A 09/12/2013   Procedure: LEFT HEART CATHETERIZATION WITH CORONARY ANGIOGRAM;  Surgeon: Kathleene Hazel, MD;  Location: [facility] CATH LAB;  Service: Cardiovascular;  Laterality: N/A;  . NASAL/SINUS ENDOSCOPY    . TOTAL HIP ARTHROPLASTY  05/08/2012   Procedure: TOTAL HIP ARTHROPLASTY;  Surgeon: Nestor Lewandowsky, MD;  Location: [facility] OR;  Service: Orthopedics;  Laterality: Left;  . TOTAL HIP ARTHROPLASTY  2014 per pt    SOCIAL HISTORY:   Social History   Tobacco Use  . Smoking status: Former Smoker    Packs/day: 0.50    Years: 39.00    Pack years: 19.50    Types: Cigarettes    Last attempt to quit: 09/15/2015    Years since quitting: 2.6  . Smokeless tobacco: Never Used  Substance Use Topics  . Alcohol use: Yes    Alcohol/week: 0.0 standard drinks    Comment: socially; reports drinking 2 beers last nigh    FAMILY HISTORY:   Family History  Problem Relation Age of Onset  . Heart attack Mother   . Heart attack Brother   . Heart attack Sister   .  Heart attack Father     DRUG ALLERGIES:   Allergies  Allergen Reactions  . Ativan [Lorazepam] Other (See Comments)    "out of mind"  . Haloperidol Decanoate     REVIEW OF SYSTEMS:  CONSTITUTIONAL: Positive for chills, sweats and fever.  Positive for fatigue.  EYES: No blurred or double vision.  EARS, NOSE, AND THROAT: No tinnitus or ear pain. No sore throat.  Decreased hearing. RESPIRATORY: Positive for cough, shortness of breath, and wheezing.  No hemoptysis.  CARDIOVASCULAR: Positive for chest pain, and edema.  GASTROINTESTINAL: No nausea, vomiting, diarrhea or abdominal pain. No blood in bowel movements.  Positive for abdominal swelling GENITOURINARY: No dysuria, hematuria.  ENDOCRINE: No polyuria, nocturia,  HEMATOLOGY: No anemia, easy bruising or bleeding SKIN: No rash or lesion. MUSCULOSKELETAL: Positive for joint pain and arthritis.    NEUROLOGIC: No tingling, numbness, weakness.  PSYCHIATRY: History of anxiety and depression.   MEDICATIONS AT HOME:   Prior to Admission medications   Medication Sig Start Date End Date Taking? Authorizing Provider  ALPRAZolam Prudy Feeler) 1 MG tablet Take 1 tablet (1 mg total) by mouth 3 (three) times daily as needed for anxiety. For anxiety Patient taking differently: Take 1 mg by mouth 3 (three) times daily. For anxiety 05/15/12   Kathlen Mody, MD  amiodarone (PACERONE) 200 MG tablet Take 1 tablet by mouth daily. 01/09/16   [provider]  amLODipine (NORVASC) 10 MG tablet Take 10 mg by mouth daily.    [provider]  aspirin 81 MG tablet Take 1 tablet (81 mg total) by mouth daily. 05/31/12   Kathlen Mody, MD  atorvastatin (LIPITOR) 10 MG tablet TAKE (1) TABLET BY MOUTH ONCE DAILY. 08/13/15   Jonelle Sidle, MD  budesonide-formoterol [facility]) 160-4.5 MCG/ACT inhaler Inhale 2 puffs into the lungs 2 (two) times daily.    [provider]  budesonide-formoterol (SYMBICORT) 160-4.5 MCG/ACT inhaler Inhale 2 Inhalers into the lungs 2 (two) times daily. 08/30/16   [provider]  buPROPion (WELLBUTRIN XL) 150 MG 24 hr tablet Take 150 mg by mouth daily.    [provider]  citalopram (CELEXA) 10 MG tablet Take 10 mg by mouth every morning.    [provider]  doxepin (SINEQUAN) 25 MG capsule Take 25 mg by mouth at bedtime.  08/06/14   [provider]  furosemide (LASIX) 40 MG tablet Take 40 mg by mouth every morning.    [provider]  gabapentin (NEURONTIN) 300 MG capsule Take 1 capsule by mouth 3 (three) times daily. 08/30/16   [provider]  hydrochlorothiazide (HYDRODIURIL) 25 MG tablet Take 25 mg by mouth every morning.  08/06/14   [provider]  ipratropium-albuterol (DUONEB) 0.5-2.5 (3) MG/3ML SOLN Take 3 mLs by nebulization every 6 (six) hours as needed (shortness of breath/ wheezing.).      [provider]  isosorbide mononitrate (IMDUR) 30 MG 24 hr tablet Take 1 tablet (30 mg total) by mouth 2 (two) times daily. Patient taking differently: Take 30 mg by mouth every morning.  08/12/14   Dhungel, Theda Belfast, MD  meloxicam (MOBIC) 15 MG tablet Take 15 mg by mouth every morning.  08/13/13   [provider]  metoprolol tartrate (LOPRESSOR) 25 MG tablet Take 1 tablet by mouth daily. 01/09/16   [provider]  nitroGLYCERIN (NITROSTAT) 0.4 MG SL tablet Place 0.4 mg under the tongue every 5 (five) minutes as needed. For chest pain    [provider]  omeprazole (  PRILOSEC) 20 MG capsule Take 20 mg by mouth every morning.     [provider]  ondansetron (ZOFRAN) 4 MG tablet Take 1 tablet by mouth 2 (two) times daily as needed. 12/02/15   [provider]  oxyCODONE-acetaminophen (PERCOCET) 10-325 MG per tablet Take 1 tablet by mouth 3 (three) times daily.  09/30/14   [provider]  potassium chloride SA (K-DUR,KLOR-CON) 20 MEQ tablet Take 20 mEq by mouth every morning.  08/13/13   [provider]  PROAIR HFA 108 (90 BASE) MCG/ACT inhaler Inhale 2 puffs into the lungs daily as needed for wheezing or shortness of breath.  06/20/13   [provider]  tamsulosin (FLOMAX) 0.4 MG CAPS capsule Take 1 capsule by mouth daily. 01/09/16   [provider]  tiotropium (SPIRIVA) 18 MCG inhalation capsule Place 18 mcg into inhaler and inhale daily.    [provider]  VITAMIN E PO Take 1 tablet by mouth daily.    [provider]  VOLTAREN 1 % GEL Apply 2 g topically daily as needed (pain).  02/27/15   [provider]      VITAL SIGNS:  Blood pressure 136/66, pulse 69, temperature 97.6 F (36.4 C), temperature source Oral, resp. rate 18, SpO2 95 %.  PHYSICAL EXAMINATION:  GENERAL:  62 y.o.-year-old patient lying in the bed with no acute distress.  EYES: Pupils equal, round, reactive to light and  accommodation. No scleral icterus. Extraocular muscles intact.  HEENT: Head atraumatic, normocephalic. Oropharynx and nasopharynx clear.  NECK:  Supple, no jugular venous distention. No thyroid enlargement, no tenderness.  LUNGS: Decreased breath sounds bilaterally, positive inspiratory and wheezing.  No rales,rhonchi or crepitation. No use of accessory muscles of respiration.  CARDIOVASCULAR: S1, S2 normal. No murmurs, rubs, or gallops.  Chest indented in secondary to numerous surgeries. ABDOMEN: Soft, nontender, nondistended. Bowel sounds present. No organomegaly or mass.  EXTREMITIES: 2+ pedal edema, no cyanosis, or clubbing.  NEUROLOGIC: Cranial nerves II through XII are intact. Muscle strength 5/5 in all extremities. Sensation intact. Gait not checked.  PSYCHIATRIC: The patient is alert and oriented x 3.  SKIN: No rash, lesion, or ulcer.    LABORATORY PANEL:   CBC Recent Labs  Lab 04/21/18 1722  WBC 9.6  HGB 12.9*  HCT 38.7*  PLT 237   ------------------------------------------------------------------------------------------------------------------  Chemistries  Recent Labs  Lab 04/21/18 1722  NA 139  K 3.8  CL 108  CO2 24  GLUCOSE 129*  BUN 14  CREATININE 1.08  CALCIUM 8.7*   ------------------------------------------------------------------------------------------------------------------  Cardiac Enzymes Recent Labs  Lab 04/21/18 1722  TROPONINI <0.03   ------------------------------------------------------------------------------------------------------------------  RADIOLOGY:  Dg Chest 2 View  Result Date: 04/21/2018 CLINICAL DATA:  Chest pain and dyspnea with dizziness EXAM: CHEST - 2 VIEW COMPARISON:  04/03/2018 FINDINGS: Stable cardiomegaly with aortic atherosclerosis. No alveolar consolidation, effusion or pneumothorax. Mild central vascular congestion is identified. Degenerative changes are present along the dorsal spine and about both shoulders.  Surgical clips project over the right heart border on the frontal view as before. IMPRESSION: Cardiomegaly with mild aortic atherosclerosis. Slight interval increase in central pulmonary vascular congestion since prior. Electronically Signed   By: Tollie Eth M.D.   On: 04/21/2018 17:47    EKG:   Normal sinus rhythm 74 bpm  IMPRESSION AND PLAN:   1.  Chest pain observation.  Stress test for tomorrow serial cardiac enzymes.  Continue aspirin and metoprolol and increased dose of Lipitor. 2.  COPD exacerbation and  likely musculoskeletal pain.  IV Solu-Medrol nebulizer treatments. 3.  Morbid obesity.  Weight loss needed 4.  Hyperlipidemia unspecified continue Lipitor 5.  Essential hypertension continue usual medications 6.  Chronic pain on chronic pain medications 7.  Send off urine toxicology  All the records are reviewed and case discussed with ED provider. Management plans discussed with the patient, family and they are in agreement.  CODE STATUS: Full code  TOTAL TIME TAKING CARE OF THIS PATIENT: 50 minutes.    Alford Highland M.D on 04/21/2018 at 7:59 PM  Between 7am to 6pm - Pager - [phone]  After 6pm call admission pager [phone]  [facility] Office  [phone]  CC: Primary care physician; Bubba Camp, MD

## 2018-04-22 ENCOUNTER — Other Ambulatory Visit: Payer: Self-pay

## 2018-04-22 DIAGNOSIS — J441 Chronic obstructive pulmonary disease with (acute) exacerbation: Secondary | ICD-10-CM | POA: Diagnosis not present

## 2018-04-22 LAB — BASIC METABOLIC PANEL
ANION GAP: 8 (ref 5–15)
BUN: 14 mg/dL (ref 6–20)
CALCIUM: 8.9 mg/dL (ref 8.9–10.3)
CO2: 27 mmol/L (ref 22–32)
Chloride: 102 mmol/L (ref 98–111)
Creatinine, Ser: 1.16 mg/dL (ref 0.61–1.24)
GFR calc Af Amer: >60 mL/min (ref >60)
GFR calc non Af Amer: >60 mL/min (ref >60)
GLUCOSE: 213 mg/dL — AB (ref 70–99)
Potassium: 4.1 mmol/L (ref 3.5–5.1)
Sodium: 137 mmol/L (ref 135–145)

## 2018-04-22 LAB — URINE DRUG SCREEN, QUALITATIVE (ARMC ONLY)
Amphetamines, Ur Screen: NOT DETECTED
BARBITURATES, UR SCREEN: NOT DETECTED
CANNABINOID 50 NG, UR ~~LOC~~: NOT DETECTED
COCAINE METABOLITE, UR ~~LOC~~: NOT DETECTED
MDMA (Ecstasy)Ur Screen: NOT DETECTED
Methadone Scn, Ur: NOT DETECTED
OPIATE, UR SCREEN: POSITIVE — AB
Phencyclidine (PCP) Ur S: NOT DETECTED
TRICYCLIC, UR SCREEN: NOT DETECTED

## 2018-04-22 LAB — LIPID PANEL
CHOL/HDL RATIO: 4.3 ratio
Cholesterol: 200 mg/dL (ref 0–200)
HDL: 47 mg/dL (ref >40)
LDL CALC: 126 mg/dL — AB (ref 0–99)
Triglycerides: 136 mg/dL (ref <150)
VLDL: 27 mg/dL (ref 0–40)

## 2018-04-22 LAB — CBC
HEMATOCRIT: 38 % — AB (ref 40.0–52.0)
HEMOGLOBIN: 13.3 g/dL (ref 13.0–18.0)
MCH: 29.7 pg (ref 26.0–34.0)
MCHC: 34.9 g/dL (ref 32.0–36.0)
MCV: 85.3 fL (ref 80.0–100.0)
Platelets: 208 10*3/uL (ref 150–440)
RBC: 4.46 MIL/uL (ref 4.40–5.90)
RDW: 17.7 % — AB (ref 11.5–14.5)
WBC: 9.6 10*3/uL (ref 3.8–10.6)

## 2018-04-22 LAB — TROPONIN I: Troponin I: <0.03 ng/mL (ref <0.03)

## 2018-04-22 MED ORDER — IPRATROPIUM-ALBUTEROL 0.5-2.5 (3) MG/3ML IN SOLN
3.0000 mL | Freq: Three times a day (TID) | RESPIRATORY_TRACT | Status: DC
  Administered 2018-04-22 – 2018-04-23 (×2): 3 mL via RESPIRATORY_TRACT
  Filled 2018-04-22 (×3): qty 3

## 2018-04-22 MED ORDER — ISOSORBIDE MONONITRATE ER 60 MG PO TB24
60.0000 mg | ORAL_TABLET | Freq: Every morning | ORAL | Status: DC
  Administered 2018-04-23: 60 mg via ORAL
  Filled 2018-04-22: qty 1

## 2018-04-22 MED ORDER — IPRATROPIUM-ALBUTEROL 0.5-2.5 (3) MG/3ML IN SOLN: 3.0000 mL | RESPIRATORY_TRACT | Status: DC | PRN

## 2018-04-22 MED ORDER — SALINE SPRAY 0.65 % NA SOLN
1.0000 | NASAL | Status: DC | PRN
  Administered 2018-04-23: 1 via NASAL
  Filled 2018-04-22: qty 44

## 2018-04-22 MED ORDER — DOCUSATE SODIUM 100 MG PO CAPS
100.0000 mg | ORAL_CAPSULE | Freq: Two times a day (BID) | ORAL | Status: DC
  Administered 2018-04-22 – 2018-04-23 (×2): 100 mg via ORAL
  Filled 2018-04-22 (×3): qty 1

## 2018-04-22 NOTE — Care Management Note (Signed)
Case Management Note  Patient Details  Name: MARIAN DENK Sr. MRN: [id number] Date of Birth: 1956/08/20  Subjective/Objective:   Patient admitted to [facility] under observation status for chest pain. RNCM consulted on patient to provide MOON letter and complete assessment. Patient recently moved here from [facility] and lives with his son Paulie Astin [phone]. Patient requires assistance with household tasks such as cleaning, cooking, etc. He tells me he intends to hire his ex wife for these personal care services. Patient uses no DME but does report increase in weakness. Patient unsure about home health but we let our team know closer to discharge time. PCP is [facility]. Will use [facility] here in [place] for medications.                   Action/Plan: RNCM to continue to follow for any needs.   Expected Discharge Date:                  Expected Discharge Plan:     In-House Referral:     Discharge planning Services     Post Acute Care Choice:    Choice offered to:     DME Arranged:    DME Agency:     HH Arranged:    HH Agency:     Status of Service:     If discussed at [employer], dates discussed:    Additional Comments:  Virgel Manifold, RN 04/22/2018, 3:35 PM

## 2018-04-22 NOTE — Care Management Obs Status (Signed)
MEDICARE OBSERVATION STATUS NOTIFICATION   Patient Details  Name: [name] SCHNEEKLOTH Sr. MRN: [id number] Date of Birth: [birth year]   Medicare Observation Status Notification Given:  Yes    Rahaf Carbonell A Selita Staiger, RN 04/22/2018, 3:26 PM

## 2018-04-22 NOTE — Progress Notes (Signed)
[employer] [facility] - [place] at [facility]   PATIENT NAME: [name]    MR#:  [id number]  DATE OF BIRTH:  September 17, 1955  SUBJECTIVE:  CHIEF COMPLAINT:   Chief Complaint  Patient presents with  . Chest Pain  Patient seen and evaluated today No new episodes of chest pain No shortness of breath No palpitations  REVIEW OF SYSTEMS:    ROS  CONSTITUTIONAL: No documented fever. No fatigue, weakness. No weight gain, no weight loss.  EYES: No blurry or double vision.  ENT: No tinnitus. No postnasal drip. No redness of the oropharynx.  RESPIRATORY: No cough, no wheeze, no hemoptysis. No dyspnea.  CARDIOVASCULAR: No chest pain. No orthopnea. No palpitations. No syncope.  GASTROINTESTINAL: No nausea, no vomiting or diarrhea. No abdominal pain. No melena or hematochezia.  GENITOURINARY: No dysuria or hematuria.  ENDOCRINE: No polyuria or nocturia. No heat or cold intolerance.  HEMATOLOGY: No anemia. No bruising. No bleeding.  INTEGUMENTARY: No rashes. No lesions.  MUSCULOSKELETAL: No arthritis. No swelling. No gout.  NEUROLOGIC: No numbness, tingling, or ataxia. No seizure-type activity.  PSYCHIATRIC: No anxiety. No insomnia. No ADD.   DRUG ALLERGIES:   Allergies  Allergen Reactions  . Ativan [Lorazepam] Other (See Comments)    "out of mind"  . Haloperidol Decanoate     VITALS:  Blood pressure 114/69, pulse 89, temperature 97.9 F (36.6 C), temperature source Oral, resp. rate 18, height 6\' 3"  (1.905 m), weight (!) 160 kg, SpO2 94 %.  PHYSICAL EXAMINATION:   Physical Exam  GENERAL:  62 y.o.-year-old obese patient lying in the bed with no acute distress.  EYES: Pupils equal, round, reactive to light and accommodation. No scleral icterus. Extraocular muscles intact.  HEENT: Head atraumatic, normocephalic. Oropharynx and nasopharynx clear.  NECK:  Supple, no jugular venous distention. No thyroid enlargement, no tenderness.  LUNGS: Normal breath sounds bilaterally, no  wheezing, rales, rhonchi. No use of accessory muscles of respiration.  CARDIOVASCULAR: S1, S2 normal. No murmurs, rubs, or gallops.  ABDOMEN: Soft, nontender, nondistended. Bowel sounds present. No organomegaly or mass.  EXTREMITIES: No cyanosis, clubbing or edema b/l.    NEUROLOGIC: Cranial nerves II through XII are intact. No focal Motor or sensory deficits b/l.   PSYCHIATRIC: The patient is alert and oriented x 3.  SKIN: No obvious rash, lesion, or ulcer.   LABORATORY PANEL:   CBC Recent Labs  Lab 04/22/18 0117  WBC 9.6  HGB 13.3  HCT 38.0*  PLT 208   ------------------------------------------------------------------------------------------------------------------ Chemistries  Recent Labs  Lab 04/22/18 0117  NA 137  K 4.1  CL 102  CO2 27  GLUCOSE 213*  BUN 14  CREATININE 1.16  CALCIUM 8.9   ------------------------------------------------------------------------------------------------------------------  Cardiac Enzymes Recent Labs  Lab 04/22/18 0117  TROPONINI <0.03   ------------------------------------------------------------------------------------------------------------------  RADIOLOGY:  Dg Chest 2 View  Result Date: 04/21/2018 CLINICAL DATA:  Chest pain and dyspnea with dizziness EXAM: CHEST - 2 VIEW COMPARISON:  04/03/2018 FINDINGS: Stable cardiomegaly with aortic atherosclerosis. No alveolar consolidation, effusion or pneumothorax. Mild central vascular congestion is identified. Degenerative changes are present along the dorsal spine and about both shoulders. Surgical clips project over the right heart border on the frontal view as before. IMPRESSION: Cardiomegaly with mild aortic atherosclerosis. Slight interval increase in central pulmonary vascular congestion since prior. Electronically Signed   By: Tollie Eth M.D.   On: 04/21/2018 17:47     ASSESSMENT AND PLAN:   62 year old male patient with history of coronary artery disease, CABG,  congestive  heart failure, COPD, hypertension, GERD presented to the emergency room for chest pain.  Patient had extensive invasive and noninvasive work-up and testing done at [facility].  -Chest pain Status post cardiology evaluation Ruled out for myocardial infarction Serial troponin negative Increase antianginal medication Medical management No acute intervention recommended by cardiology  -Coronary artery disease Continue aspirin, metoprolol and Lipitor  -COPD exacerbation improving Continue Solu-Medrol and nebulization treatments  -Obesity Weight loss counseling given  -Hyperlipidemia Continue statin medication  All the records are reviewed and case discussed with Care Management/Social Worker. Management plans discussed with the patient, family and they are in agreement.  CODE STATUS: Full code  DVT Prophylaxis: SCDs  TOTAL TIME TAKING CARE OF THIS PATIENT: 35 minutes.   POSSIBLE D/C IN 1 to 2 DAYS, DEPENDING ON CLINICAL CONDITION.  Ihor Austin M.D on 04/22/2018 at 1:58 PM  Between 7am to 6pm - Pager - [phone]  After 6pm go to [email or web address] - password [email or web address] [facility]  [facility] [place] [facility]  Office  [phone]  CC: Primary care physician; Bubba Camp, MD  Note: This dictation was prepared with Dragon dictation along with smaller phrase technology. Any transcriptional errors that result from this process are unintentional.

## 2018-04-22 NOTE — Progress Notes (Signed)
Advanced care plan.  Purpose of the Encounter: CODE STATUS  Parties in Attendance: Patient  Patient's Decision Capacity: Good  Subjective/Patient's story: Presented to emergency room for chest pain   Objective/Medical story Has coronary artery disease and had extensive invasive and noninvasive work-up done at [facility] Will get cardiology evaluation and work-up   Goals of care determination:  Advance care directives goals of care discussed For now patient wants everything done which includes CPR, intubation and ventilator if the need arises   CODE STATUS: Full code   Time spent discussing advanced care planning: 16 minutes

## 2018-04-22 NOTE — Consult Note (Signed)
Cardiology Consultation Note    Patient ID: [name] Atienza., MRN: [id number], DOB/AGE: 09/26/1955 62 y.o. Admit date: 04/21/2018   Date of Consult: 04/22/2018 Primary Physician: Bubba Camp, MD Primary Cardiologist: Dr. Christell Constant, [facility]  Chief Complaint: chest pain Reason for Consultation: chest pain Requesting MD: Dr. Tobi Bastos  HPI: [name] MACGREGOR Sr. is a 62 y.o. 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  He has had a number of admissions to [facility] with chest pain.  Underwent a cardiac cath  in 2017 showing a patent right internal mammary to the RCA with no significant disease in the left system.  In 2018 December less than 1 year ago he was readmitted with chest pain.  Underwent a repeat cardiac catheterization which again showed no progression of disease.  He now presents to our ER with similar complaints of chest pain.  He has ruled out for a myocardial infarction.  Patient had 3 cardiac catheterizations and a course of 18 months most recent in December 2018 all showing no progression of disease.  EKG shows sinus rhythm with nonspecific ST-T wave changes no change from his baseline.  He is currently on amiodarone, metoprolol, Norco and isosorbide.  Past Medical History:  Diagnosis Date  . Anxiety disorder   . Arthritis   . CAD (coronary artery disease)    a. ~ 2000 cath at [facility];  b. 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  . CHF (congestive heart failure) (HCC)   .  Chronic back pain   . Chronic hip pain   . COPD (chronic obstructive pulmonary disease) (HCC)   . Depression   . Essential hypertension, benign   . GERD (gastroesophageal reflux disease)   . Headache(784.0)   . History of alcohol abuse   . History of cocaine abuse   . Hyperlipidemia   . Obesity   . Pneumonia       Surgical History:  Past Surgical History:  Procedure Laterality Date  . BACK SURGERY    . CHOLECYSTECTOMY    . CORONARY ARTERY BYPASS GRAFT    . HIP SURGERY     rt  . JOINT REPLACEMENT    . LEFT HEART CATHETERIZATION WITH CORONARY ANGIOGRAM N/A 09/12/2013   Procedure: LEFT HEART CATHETERIZATION WITH CORONARY ANGIOGRAM;  Surgeon: Kathleene Hazel, MD;  Location: [facility] CATH LAB;  Service: Cardiovascular;  Laterality: N/A;  . NASAL/SINUS ENDOSCOPY    . TOTAL HIP ARTHROPLASTY  05/08/2012   Procedure: TOTAL HIP ARTHROPLASTY;  Surgeon: Nestor Lewandowsky, MD;  Location: [facility] OR;  Service: Orthopedics;  Laterality: Left;  . TOTAL HIP ARTHROPLASTY  2014 per pt     Home Meds: Prior to Admission medications   Medication Sig Start Date End Date Taking? Authorizing Provider  aspirin 81 MG tablet Take 1 tablet (81 mg total) by mouth daily. 05/31/12  Yes Kathlen Mody, MD  buPROPion (WELLBUTRIN XL) 150 MG 24 hr tablet Take 150 mg by mouth daily.  Yes [provider]  carvedilol (COREG) 3.125 MG tablet Take 1 tablet by mouth 2 (two) times daily. 02/21/18  Yes [provider]  citalopram (CELEXA) 10 MG tablet Take 10 mg by mouth every morning.   Yes [provider]  doxepin (SINEQUAN) 25 MG capsule Take 25 mg by mouth at bedtime.  08/06/14  Yes [provider]  furosemide (LASIX) 40 MG tablet Take 20 mg by mouth every morning.    Yes [provider]  ipratropium-albuterol (DUONEB) 0.5-2.5 (3) MG/3ML SOLN Take 3 mLs by nebulization every 6 (six) hours as needed (shortness of breath/ wheezing.).    Yes [provider]  isosorbide  mononitrate (IMDUR) 30 MG 24 hr tablet Take 1 tablet (30 mg total) by mouth 2 (two) times daily. Patient taking differently: Take 60 mg by mouth daily.  08/12/14  Yes Dhungel, Nishant, MD  omeprazole (PRILOSEC) 20 MG capsule Take 20 mg by mouth every morning.    Yes [provider]  oxyCODONE-acetaminophen (PERCOCET) 10-325 MG tablet Take 1 tablet by mouth every 4 (four) hours as needed for pain.   Yes [provider]  potassium chloride SA (K-DUR,KLOR-CON) 20 MEQ tablet Take 20 mEq by mouth every morning.  08/13/13  Yes [provider]  PROAIR HFA 108 (90 BASE) MCG/ACT inhaler Inhale 2 puffs into the lungs daily as needed for wheezing or shortness of breath.  06/20/13  Yes [provider]  tiotropium (SPIRIVA) 18 MCG inhalation capsule Place 18 mcg into inhaler and inhale daily.   Yes [provider]  traZODone (DESYREL) 50 MG tablet Take 1-2 tablets by mouth at bedtime. 03/22/18  Yes [provider]  VITAMIN E PO Take 1 tablet by mouth daily.   Yes [provider]  ALPRAZolam Prudy Feeler) 1 MG tablet Take 1 tablet (1 mg total) by mouth 3 (three) times daily as needed for anxiety. For anxiety Patient not taking: Reported on 04/21/2018 05/15/12   Kathlen Mody, MD  atorvastatin (LIPITOR) 10 MG tablet TAKE (1) TABLET BY MOUTH ONCE DAILY. Patient not taking: Reported on 04/21/2018 08/13/15   Jonelle Sidle, MD  budesonide-formoterol [facility]) 160-4.5 MCG/ACT inhaler Inhale 2 puffs into the lungs 2 (two) times daily.    [provider]  [facility] MELATONIN 3 MG TABS Take 1 tablet by mouth at bedtime as needed. 03/22/18   [provider]  nitroGLYCERIN (NITROSTAT) 0.4 MG SL tablet Place 0.4 mg under the tongue every 5 (five) minutes as needed. For chest pain    [provider]    Inpatient Medications:  . amiodarone  200 mg Oral Daily  . aspirin EC  81 mg Oral Daily  . atorvastatin  40 mg Oral q1800  . budesonide  (PULMICORT) nebulizer solution  0.5 mg Nebulization BID  . buPROPion  150 mg Oral Daily  . citalopram  10 mg Oral q morning - 10a  . doxepin  25 mg Oral QHS  . enoxaparin (LOVENOX) injection  40 mg Subcutaneous Q24H  . furosemide  40 mg Oral q morning - 10a  . gabapentin  300 mg Oral TID  . ipratropium-albuterol  3 mL Nebulization Q6H  . isosorbide mononitrate  30 mg Oral q morning - 10a  . methylPREDNISolone (SOLU-MEDROL) injection  40 mg Intravenous Q12H  . metoprolol tartrate  25 mg Oral Daily  . pantoprazole  40 mg Oral Daily  . potassium chloride SA  20 mEq Oral q morning - 10a  . tamsulosin  0.4  mg Oral Daily  . tiotropium  18 mcg Inhalation Daily     Allergies:  Allergies  Allergen Reactions  . Ativan [Lorazepam] Other (See Comments)    "out of mind"  . Haloperidol Decanoate     Social History   Socioeconomic History  . Marital status: Widowed    Spouse name: Not on file  . Number of children: Not on file  . Years of education: Not on file  . Highest education level: Not on file  Occupational History  . Occupation: UNEMPLOYED  Social Needs  . Financial resource strain: Not on file  . Food insecurity:    Worry: Not on file    Inability: Not on file  . Transportation needs:    Medical: Not on file    Non-medical: Not on file  Tobacco Use  . Smoking status: Former Smoker    Packs/day: 0.50    Years: 39.00    Pack years: 19.50    Types: Cigarettes    Last attempt to quit: 09/15/2015    Years since quitting: 2.6  . Smokeless tobacco: Never Used  Substance and Sexual Activity  . Alcohol use: Yes    Alcohol/week: 0.0 standard drinks    Comment: socially; reports drinking 2 beers last nigh  . Drug use: No  . Sexual activity: Not on file  Lifestyle  . Physical activity:    Days per week: Not on file    Minutes per session: Not on file  . Stress: Not on file  Relationships  . Social connections:    Talks on phone: Not on file    Gets together: Not on  file    Attends religious service: Not on file    Active member of club or organization: Not on file    Attends meetings of clubs or organizations: Not on file    Relationship status: Not on file  . Intimate partner violence:    Fear of current or ex partner: Not on file    Emotionally abused: Not on file    Physically abused: Not on file    Forced sexual activity: Not on file  Other Topics Concern  . Not on file  Social History Narrative  . Not on file     Family History  Problem Relation Age of Onset  . Heart attack Mother   . Heart attack Brother   . Heart attack Sister   . Heart attack Father      Review of Systems: A 12-system review of systems was performed and is negative except as noted in the HPI.  Labs: Recent Labs    04/21/18 1722 04/21/18 2133 04/22/18 0117  TROPONINI <0.03 <0.03 <0.03   Lab Results  Component Value Date   WBC 9.6 04/22/2018   HGB 13.3 04/22/2018   HCT 38.0 (L) 04/22/2018   MCV 85.3 04/22/2018   PLT 208 04/22/2018    Recent Labs  Lab 04/22/18 0117  NA 137  K 4.1  CL 102  CO2 27  BUN 14  CREATININE 1.16  CALCIUM 8.9  GLUCOSE 213*   Lab Results  Component Value Date   CHOL 200 04/22/2018   HDL 47 04/22/2018   LDLCALC 126 (H) 04/22/2018   TRIG 136 04/22/2018   Lab Results  Component Value Date   DDIMER <0.27 10/19/2014    Radiology/Studies:  Dg Chest 2 View  Result Date: 04/21/2018 CLINICAL DATA:  Chest pain and dyspnea with dizziness EXAM: CHEST - 2 VIEW COMPARISON:  04/03/2018 FINDINGS: Stable cardiomegaly with aortic atherosclerosis. No alveolar consolidation, effusion or pneumothorax. Mild central vascular congestion is identified. Degenerative changes are present along the dorsal spine and about both shoulders. Surgical clips project over the right heart border on the frontal view as before. IMPRESSION: Cardiomegaly with mild aortic atherosclerosis. Slight interval increase in central pulmonary vascular congestion  since prior. Electronically Signed   By: Tollie Eth M.D.   On: 04/21/2018 17:47   Dg Chest 2 View  Result Date: 04/03/2018 CLINICAL DATA:  Shortness of breath EXAM: CHEST - 2 VIEW COMPARISON:  03/30/2018, 03/29/2018, 10/03/2016 FINDINGS: No pleural effusion. Mild cardiomegaly. No focal opacity. No pneumothorax. Mild aortic atherosclerosis. Degenerative osteophytes of the spine. IMPRESSION: No active cardiopulmonary disease.  Mild cardiomegaly. Electronically Signed   By: Jasmine Pang M.D.   On: 04/03/2018 20:06   Dg Chest 2 View  Result Date: 03/29/2018 CLINICAL DATA:  Chest pain increased shortness of breath over the past 3 days. Smoker. EXAM: CHEST - 2 VIEW COMPARISON:  10/03/2016. FINDINGS: Stable enlarged cardiac silhouette, small bilateral linear densities and prominent interstitial markings. The lungs remain hyperexpanded. Thoracic spine degenerative changes. IMPRESSION: No acute abnormality. Stable cardiomegaly, changes of COPD and bilateral linear scarring. Electronically Signed   By: Beckie Salts M.D.   On: 03/29/2018 19:42   Dg Chest Portable 1 View  Result Date: 03/30/2018 CLINICAL DATA:  Dyspnea and fluid in lungs. EXAM: PORTABLE CHEST 1 VIEW COMPARISON:  03/29/2018 FINDINGS: Stable cardiomegaly with aortic atherosclerosis. No alveolar consolidation, effusion or pneumothorax. Surgical clips project over the right heart border. No acute osseous abnormality. IMPRESSION: Stable cardiomegaly with aortic atherosclerosis. No active pulmonary disease. Electronically Signed   By: Tollie Eth M.D.   On: 03/30/2018 20:11    Wt Readings from Last 3 Encounters:  04/22/18 (!) 160 kg  04/03/18 (!) 157.4 kg  03/30/18 (!) 157.7 kg    EKG: Normal sinus rhythm nonspecific ST-T wave changes  Physical Exam:  Blood pressure 114/69, pulse 82, temperature 97.9 F (36.6 C), temperature source Oral, resp. rate 20, height 6\' 3"  (1.905 m), weight (!) 160 kg, SpO2 95 %. Body mass index is 44.08  kg/m. General: Well developed, well nourished, in no acute distress. Head: Normocephalic, atraumatic, sclera non-icteric, no xanthomas, nares are without discharge.  Neck: Negative for carotid bruits. JVD not elevated. Lungs: Clear bilaterally to auscultation without wheezes, rales, or rhonchi. Breathing is unlabored. Heart: RRR with S1 S2.  Status post surgical resection of the sternum. Abdomen: Soft, non-tender, non-distended with normoactive bowel sounds. No hepatomegaly. No rebound/guarding. No obvious abdominal masses. Msk:  Strength and tone appear normal for age. Extremities: No clubbing or cyanosis. No edema.  Distal pedal pulses are 2+ and equal bilaterally. Neuro: Alert and oriented X 3. No facial asymmetry. No focal deficit. Moves all extremities spontaneously. Psych:  Responds to questions appropriately with a normal affect.     Assessment and Plan  62 year old male with history of single-vessel coronary disease status post coronary bypass grafting approximately 2 and half years ago with RIMA to the RCA.  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  He has had multiple admissions to [facility] with chest pain.  He has had 3 cardiac catheterizations over the past year most recent in December 2018.  All of shown patent RIMA graft to the RCA with no significant progression of disease in the left system.  He now with presents to our hospital complaining of chest pain.  He is ruled out for myocardial infarction.  Electrocardiogram is unchanged from baseline.  Etiology of pain appears to be nonischemic.  Will attempt to moderate his antianginal regimen by increasing his isosorbide.  Will attempt to ambulate and if stable in 24 hours plan discharge.  Patient does not appear to require noninvasive evaluation or invasive evaluation at present given extensive invasive and noninvasive work-up at [facility].  Further recommendations pending course.  Signed, Dalia Heading  MD 04/22/2018, 9:40 AM Pager: [phone]

## 2018-04-22 NOTE — Progress Notes (Addendum)
Nuclear Medicine called this RN and stated that the patient would not have a stress test done today.  They only do 2 stress tests on Saturdays and they have their 2 scheduled already.  Also, due to the patient's weight, he may have to have a 2 day stress test, which would be better to start on Monday, since they're closed on Sundays.  However, Monday is a holiday, and they told another RN they're closed.  Patient will probably have stress test on Tuesday.

## 2018-04-23 DIAGNOSIS — J441 Chronic obstructive pulmonary disease with (acute) exacerbation: Secondary | ICD-10-CM | POA: Diagnosis not present

## 2018-04-23 MED ORDER — BUPROPION HCL ER (XL) 150 MG PO TB24: 150.0000 mg | ORAL_TABLET | Freq: Every day | ORAL | 0 refills | Status: DC

## 2018-04-23 MED ORDER — GABAPENTIN 300 MG PO CAPS: 300.0000 mg | ORAL_CAPSULE | Freq: Three times a day (TID) | ORAL | 0 refills | Status: DC

## 2018-04-23 MED ORDER — METOPROLOL TARTRATE 25 MG PO TABS: 25.0000 mg | ORAL_TABLET | Freq: Every day | ORAL | 0 refills | Status: DC

## 2018-04-23 MED ORDER — ATORVASTATIN CALCIUM 10 MG PO TABS: 10.0000 mg | ORAL_TABLET | Freq: Every day | ORAL | 0 refills | Status: DC

## 2018-04-23 MED ORDER — PREDNISONE 10 MG PO TABS: 10.0000 mg | ORAL_TABLET | Freq: Every day | ORAL | 0 refills | Status: DC

## 2018-04-23 NOTE — Plan of Care (Signed)

## 2018-04-23 NOTE — Plan of Care (Signed)
  Problem: Pain Managment: Goal: General experience of comfort will improve Outcome: Not Progressing  Pt wants this Rn to wake him up to give pain medication. Explained to pt that this Rn will not wake pt up, that to Rn suggests that maybe at that time the pt is not in pain. Continue to monitor

## 2018-04-23 NOTE — Progress Notes (Signed)
Patient Name: [name] GRAINGER Sr. 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  Now admitted with chest pain.  Clinically stable.  Has had multiple cardiac caths recently showing patent internal mammary to the RCA.  Subjective   Appears improved. Inpatient Medications    . amiodarone  200 mg Oral Daily  . aspirin EC  81 mg Oral Daily  . atorvastatin  40 mg Oral q1800  . budesonide (PULMICORT) nebulizer solution  0.5 mg Nebulization BID  . buPROPion  150 mg Oral Daily  . citalopram  10 mg Oral q morning - 10a  . docusate sodium  100 mg Oral BID  . doxepin  25 mg Oral QHS  . enoxaparin (LOVENOX) injection  40 mg Subcutaneous Q24H  . furosemide  40 mg Oral q morning - 10a  . gabapentin  300 mg Oral TID  . ipratropium-albuterol  3 mL Nebulization TID  . isosorbide mononitrate  60 mg Oral q morning - 10a  . methylPREDNISolone (SOLU-MEDROL) injection  40 mg Intravenous Q12H  . metoprolol tartrate  25 mg Oral Daily  . pantoprazole  40 mg Oral Daily  . potassium chloride SA  20 mEq Oral q morning - 10a  . tamsulosin  0.4 mg Oral Daily  . tiotropium  18 mcg Inhalation Daily    Vital Signs    Vitals:   04/22/18 2002 04/23/18 0358 04/23/18 0713 04/23/18 0732  BP:  96/65 118/72   Pulse:  74 76 74  Resp:  18 18 18   Temp:  98 F (36.7 C) 98.3 F (36.8 C)   TempSrc:   Oral   SpO2: 96% 95% 96% 95%  Weight:  (!) 161.9 kg    Height:        Intake/Output Summary (Last 24 hours) at 04/23/2018 0930 Last data filed at 04/23/2018 0650 Gross per 24 hour  Intake 600 ml  Output 1800 ml  Net -1200 ml   Filed Weights   04/21/18 2051 04/22/18 0441 04/23/18 0358  Weight: (!) 161.3 kg (!) 160 kg (!) 161.9 kg    Physical Exam    GEN: Well nourished, well developed, in no acute  distress.  HEENT: normal.  Neck: Supple, no JVD, carotid bruits, or masses. Cardiac: RRR, no murmurs, rubs, or gallops. No clubbing, cyanosis, edema.  Radials/DP/PT 2+ and equal bilaterally.  Respiratory:  Respirations regular and unlabored, clear to auscultation bilaterally. GI: Soft, nontender, nondistended, BS + x 4. MS: no deformity or atrophy. Skin: warm and dry, no rash. Neuro:  Strength and sensation are intact. Psych: Normal affect.  Labs    CBC Recent Labs    04/21/18 1722 04/22/18 0117  WBC 9.6 9.6  HGB 12.9* 13.3  HCT 38.7* 38.0*  MCV 86.4 85.3  PLT 237 208   Basic Metabolic Panel Recent Labs    40/98/11 1722 04/22/18 0117  NA 139 137  K 3.8 4.1  CL 108 102  CO2 24 27  GLUCOSE 129* 213*  BUN 14 14  CREATININE 1.08 1.16  CALCIUM 8.7* 8.9   Liver Function Tests No results for input(s): AST, ALT, ALKPHOS, BILITOT, PROT, ALBUMIN in the last 72 hours. No results for input(s): LIPASE, AMYLASE in the last 72 hours. Cardiac Enzymes Recent Labs  04/21/18 1722 04/21/18 2133 04/22/18 0117  TROPONINI <0.03 <0.03 <0.03   BNP No results for input(s): BNP in the last 72 hours. D-Dimer No results for input(s): DDIMER in the last 72 hours. Hemoglobin A1C No results for input(s): HGBA1C in the last 72 hours. Fasting Lipid Panel Recent Labs    04/22/18 0117  CHOL 200  HDL 47  LDLCALC 126*  TRIG 136  CHOLHDL 4.3   Thyroid Function Tests No results for input(s): TSH, T4TOTAL, T3FREE, THYROIDAB in the last 72 hours.  Invalid input(s): FREET3  Telemetry  Sinus rhythm  ECG    Normal sinus rhythm with no ischemia is incomplete right bundle branch block.  Radiology    Dg Chest 2 View  Result Date: 04/21/2018 CLINICAL DATA:  Chest pain and dyspnea with dizziness EXAM: CHEST - 2 VIEW COMPARISON:  04/03/2018 FINDINGS: Stable cardiomegaly with aortic atherosclerosis. No alveolar consolidation, effusion or pneumothorax. Mild central vascular congestion  is identified. Degenerative changes are present along the dorsal spine and about both shoulders. Surgical clips project over the right heart border on the frontal view as before. IMPRESSION: Cardiomegaly with mild aortic atherosclerosis. Slight interval increase in central pulmonary vascular congestion since prior. Electronically Signed   By: Tollie Eth M.D.   On: 04/21/2018 17:47   Dg Chest 2 View  Result Date: 04/03/2018 CLINICAL DATA:  Shortness of breath EXAM: CHEST - 2 VIEW COMPARISON:  03/30/2018, 03/29/2018, 10/03/2016 FINDINGS: No pleural effusion. Mild cardiomegaly. No focal opacity. No pneumothorax. Mild aortic atherosclerosis. Degenerative osteophytes of the spine. IMPRESSION: No active cardiopulmonary disease.  Mild cardiomegaly. Electronically Signed   By: Jasmine Pang M.D.   On: 04/03/2018 20:06   Dg Chest 2 View  Result Date: 03/29/2018 CLINICAL DATA:  Chest pain increased shortness of breath over the past 3 days. Smoker. EXAM: CHEST - 2 VIEW COMPARISON:  10/03/2016. FINDINGS: Stable enlarged cardiac silhouette, small bilateral linear densities and prominent interstitial markings. The lungs remain hyperexpanded. Thoracic spine degenerative changes. IMPRESSION: No acute abnormality. Stable cardiomegaly, changes of COPD and bilateral linear scarring. Electronically Signed   By: Beckie Salts M.D.   On: 03/29/2018 19:42   Dg Chest Portable 1 View  Result Date: 03/30/2018 CLINICAL DATA:  Dyspnea and fluid in lungs. EXAM: PORTABLE CHEST 1 VIEW COMPARISON:  03/29/2018 FINDINGS: Stable cardiomegaly with aortic atherosclerosis. No alveolar consolidation, effusion or pneumothorax. Surgical clips project over the right heart border. No acute osseous abnormality. IMPRESSION: Stable cardiomegaly with aortic atherosclerosis. No active pulmonary disease. Electronically Signed   By: Tollie Eth M.D.   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  Is sternum appears stable.  No significant ischemia on EKG.  Has had 3 cardiac catheterizations over the past year and a half all of which showed patent graft to the RCA.  Does not appear to have progression of his coronary disease.  Would ambulate and discharge on current regimen.  Follow-up with his primary cardiovascular team at [facility].  Signed, Darlin Priestly Paislea Hatton MD 04/23/2018, 9:30 AM  Pager: [phone]

## 2018-04-23 NOTE — Discharge Summary (Signed)
[facility] [facility] [place] at [facility]   PATIENT NAME: [name]    MR#:  [id number]  DATE OF BIRTH:  Aug 25, 1955  DATE OF ADMISSION:  04/21/2018 ADMITTING PHYSICIAN: Alford Highland, MD  DATE OF DISCHARGE: 04/23/2018  9:57 AM  PRIMARY CARE PHYSICIAN: Bubba Camp, MD   ADMISSION DIAGNOSIS:  Shortness of breath [R06.02] Nonspecific chest pain [R07.9] Coronary artery disease Chronic congestive heart failure COPD Hypertension GERD Hyperlipidemia DISCHARGE DIAGNOSIS:  Active Problems:   Chest pain Obesity COPD exacerbation Hyperlipidemia Hypertension  SECONDARY DIAGNOSIS:   Past Medical History:  Diagnosis Date  . Anxiety disorder   . Arthritis   . CAD (coronary artery disease)    a. ~ 2000 cath at [facility];  b. 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  . CHF (congestive heart failure) (HCC)   . Chronic back pain   . Chronic hip pain   . COPD (chronic obstructive pulmonary disease) (HCC)   . Depression   . Essential hypertension, benign   . GERD (gastroesophageal reflux disease)   . Headache(784.0)   . History of alcohol abuse   . History of cocaine abuse   . Hyperlipidemia   . Obesity   . Pneumonia      ADMITTING HISTORY [name]  is a 62 y.o. male with a known history of CAD presents with chest pain on and off since Monday.  Described as chest pressure in the center of his chest.  He states he has had 11 surgeries on his chest.  Pressure graded 6-7 out of 10 intensity.  Associated with left arm numbness and tingling.  When he walks he short of breath.  He states he has had a little fever and chills.  He states his abdomen is a little more swollen.  The last time I had him in the hospital he signed out AGAINST MEDICAL ADVICE.  Also was in the ER a few times since then and left the ER.  Hospitalist services  were contacted for evaluation.  First troponin negative.   HOSPITAL COURSE:  Vision was admitted to telemetry.  Serial troponin was negative.  Patient was extensively worked up invasively and noninvasively at [facility] for coronary artery disease.  He was seen by Dr. Lady Gary for cardiology in the hospital.  Patient was worked up with chest x-ray which showed no pneumonia.  Cardiology did not recommend any acute intervention or any testing in this hospitalization.  Chest pain was reproducible and mostly musculoskeletal in etiology.  Patient has follow-up appointment at [facility] cardiology cardiology.  Patient hemodynamically stable will be discharged home.  CONSULTS OBTAINED:  Treatment Team:  Dalia Heading, MD  DRUG ALLERGIES:   Allergies  Allergen Reactions  . Ativan [Lorazepam] Other (See Comments)    "out of mind"  . Haloperidol Decanoate   . Tiotropium Swelling    Per patient his tongue swells up     DISCHARGE MEDICATIONS:   Allergies as of 04/23/2018      Reactions   Ativan [lorazepam] Other (See Comments)   "out of mind"   Haloperidol Decanoate    Tiotropium Swelling   Per patient his tongue swells up       Medication List    STOP taking these medications   ALPRAZolam 1 MG tablet Commonly known as:  Prudy Feeler  TAKE these medications   aspirin 81 MG tablet Take 1 tablet (81 mg total) by mouth daily.   atorvastatin 10 MG tablet Commonly known as:  LIPITOR Take 1 tablet (10 mg total) by mouth daily. What changed:  See the new instructions.   budesonide-formoterol 160-4.5 MCG/ACT inhaler Commonly known as:  SYMBICORT Inhale 2 puffs into the lungs 2 (two) times daily.   buPROPion 150 MG 24 hr tablet Commonly known as:  WELLBUTRIN XL Take 1 tablet (150 mg total) by mouth daily for 15 days.   carvedilol 3.125 MG tablet Commonly known as:  COREG Take 1 tablet by mouth 2 (two) times daily.   citalopram 10 MG tablet Commonly known as:  CELEXA Take 10 mg by mouth  every morning.   [employer] MELATONIN 3 MG Tabs Generic drug:  Melatonin Take 1 tablet by mouth at bedtime as needed.   doxepin 25 MG capsule Commonly known as:  SINEQUAN Take 25 mg by mouth at bedtime.   furosemide 40 MG tablet Commonly known as:  LASIX Take 20 mg by mouth every morning.   gabapentin 300 MG capsule Commonly known as:  NEURONTIN Take 1 capsule (300 mg total) by mouth 3 (three) times daily.   ipratropium-albuterol 0.5-2.5 (3) MG/3ML Soln Commonly known as:  DUONEB Take 3 mLs by nebulization every 6 (six) hours as needed (shortness of breath/ wheezing.).   isosorbide mononitrate 30 MG 24 hr tablet Commonly known as:  IMDUR Take 1 tablet (30 mg total) by mouth 2 (two) times daily. What changed:    how much to take  when to take this   metoprolol tartrate 25 MG tablet Commonly known as:  LOPRESSOR Take 1 tablet (25 mg total) by mouth daily. Start taking on:  04/24/2018   nitroGLYCERIN 0.4 MG SL tablet Commonly known as:  NITROSTAT Place 0.4 mg under the tongue every 5 (five) minutes as needed. For chest pain   omeprazole 20 MG capsule Commonly known as:  PRILOSEC Take 20 mg by mouth every morning.   oxyCODONE-acetaminophen 10-325 MG tablet Commonly known as:  PERCOCET Take 1 tablet by mouth every 4 (four) hours as needed for pain.   potassium chloride SA 20 MEQ tablet Commonly known as:  K-DUR,KLOR-CON Take 20 mEq by mouth every morning.   predniSONE 10 MG tablet Commonly known as:  DELTASONE Take 1 tablet (10 mg total) by mouth daily. Label  & dispense according to the schedule below.  6 tablets day one, then 5 table day 2, then 4 tablets day 3, then 3 tablets day 4, 2 tablets day 5, then 1 tablet day 6, then stop   PROAIR HFA 108 (90 Base) MCG/ACT inhaler Generic drug:  albuterol Inhale 2 puffs into the lungs daily as needed for wheezing or shortness of breath.   tiotropium 18 MCG inhalation capsule Commonly known as:  SPIRIVA Place 18 mcg into  inhaler and inhale daily.   traZODone 50 MG tablet Commonly known as:  DESYREL Take 1-2 tablets by mouth at bedtime.   VITAMIN E PO Take 1 tablet by mouth daily.       Today   Patient seen and evaluated today No chest pain Shortness of breath No palpitations  VITAL SIGNS:  Blood pressure 118/72, pulse 74, temperature 98.3 F (36.8 C), temperature source Oral, resp. rate 18, height 6\' 3"  (1.905 m), weight (!) 161.9 kg, SpO2 95 %.  I/O:    Intake/Output Summary (Last 24 hours) at 04/23/2018 1255 Last data filed  at 04/23/2018 0650 Gross per 24 hour  Intake 600 ml  Output 1800 ml  Net -1200 ml    PHYSICAL EXAMINATION:  Physical Exam  GENERAL:  63 y.o.-year-old patient lying in the bed with no acute distress.  LUNGS: Normal breath sounds bilaterally, no wheezing, rales,rhonchi or crepitation. No use of accessory muscles of respiration.  CARDIOVASCULAR: S1, S2 normal. No murmurs, rubs, or gallops.  ABDOMEN: Soft, non-tender, non-distended. Bowel sounds present. No organomegaly or mass.  NEUROLOGIC: Moves all 4 extremities. PSYCHIATRIC: The patient is alert and oriented x 3.  SKIN: No obvious rash, lesion, or ulcer.   DATA REVIEW:   CBC Recent Labs  Lab 04/22/18 0117  WBC 9.6  HGB 13.3  HCT 38.0*  PLT 208    Chemistries  Recent Labs  Lab 04/22/18 0117  NA 137  K 4.1  CL 102  CO2 27  GLUCOSE 213*  BUN 14  CREATININE 1.16  CALCIUM 8.9    Cardiac Enzymes Recent Labs  Lab 04/22/18 0117  TROPONINI <0.03    Microbiology Results  Results for orders placed or performed during the hospital encounter of 03/29/18  MRSA PCR Screening     Status: None   Collection Time: 03/30/18 12:27 AM  Result Value Ref Range Status   MRSA by PCR NEGATIVE NEGATIVE Final    Comment:        The GeneXpert MRSA Assay (FDA approved for NASAL specimens only), is one component of a comprehensive MRSA colonization surveillance program. It is not intended to diagnose  MRSA infection nor to guide or monitor treatment for MRSA infections. Performed at [facility], [address] [place] [place], [place] [address]     RADIOLOGY:  Dg Chest 2 View  Result Date: 04/21/2018 CLINICAL DATA:  Chest pain and dyspnea with dizziness EXAM: CHEST - 2 VIEW COMPARISON:  04/03/2018 FINDINGS: Stable cardiomegaly with aortic atherosclerosis. No alveolar consolidation, effusion or pneumothorax. Mild central vascular congestion is identified. Degenerative changes are present along the dorsal spine and about both shoulders. Surgical clips project over the right heart border on the frontal view as before. IMPRESSION: Cardiomegaly with mild aortic atherosclerosis. Slight interval increase in central pulmonary vascular congestion since prior. Electronically Signed   By: Tollie Eth M.D.   On: 04/21/2018 17:47    Follow up with PCP in 1 week.  Management plans discussed with the patient, family and they are in agreement.  CODE STATUS: Full code    Code Status Orders  (From admission, onward)         Start     Ordered   04/21/18 1953  Full code  Continuous     04/21/18 1953        Code Status History    Date Active Date Inactive Code Status Order ID Comments User Context   03/29/2018 2255 03/30/2018 1359 Full Code [id number]  Oralia Manis, MD Inpatient   02/25/2016 1439 02/26/2016 1903 Full Code [id number]  Standley Brooking, MD Inpatient   07/11/2015 2131 07/14/2015 1339 Full Code [id number]  Chilton Si, MD Inpatient   04/30/2015 0535 04/30/2015 1849 Full Code [id number]  Haydee Monica, MD Inpatient   10/22/2014 2315 10/23/2014 1059 Full Code [id number]  Haydee Monica, MD Inpatient   10/19/2014 0309 10/20/2014 2038 Full Code [id number]  Eduard Clos, MD Inpatient   09/19/2014 0047 09/19/2014 1655 Full Code [id number]  Pearson Grippe, MD Inpatient   08/08/2014 2134 08/12/2014 1428 Full Code [id number]  Wilson Singer, MD  Inpatient   12/05/2013 0623 12/07/2013 1141 Full  Code [id number]  Meredeth Ide, MD ED   09/11/2013 2307 09/14/2013 1440 Full Code [id number]  Meredeth Ide, MD Inpatient      TOTAL TIME TAKING CARE OF THIS PATIENT ON DAY OF DISCHARGE: more than 34 minutes.   Ihor Austin M.D on 04/23/2018 at 12:55 PM  Between 7am to 6pm - Pager - [phone]  After 6pm go to [email or web address] - password [email or web address] [facility]  [facility] [place] [facility]  Office  [phone]  CC: Primary care physician; Bubba Camp, MD  Note: This dictation was prepared with Dragon dictation along with smaller phrase technology. Any transcriptional errors that result from this process are unintentional.

## 2018-04-23 NOTE — Progress Notes (Signed)
Patient given discharge instructions with printed prescriptions. Patient verbalized understanding with no further questions or concerns. IV taken out and tele monitor off. Patient going home via family vehicle. Requested to be wheeled down in wheelchair through the emergency department. Son awaiting for him.

## 2018-04-25 ENCOUNTER — Encounter: Payer: Self-pay | Admitting: Emergency Medicine

## 2018-04-25 ENCOUNTER — Emergency Department
Admission: EM | Admit: 2018-04-25 | Discharge: 2018-04-25 | Disposition: A | Discharge status: Home / Self Care | Payer: Medicare Other | Attending: Emergency Medicine | Admitting: Emergency Medicine

## 2018-04-25 ENCOUNTER — Other Ambulatory Visit: Payer: Self-pay

## 2018-04-25 DIAGNOSIS — R296 Repeated falls: Secondary | ICD-10-CM | POA: Diagnosis not present

## 2018-04-25 DIAGNOSIS — M6281 Muscle weakness (generalized): Secondary | ICD-10-CM | POA: Diagnosis present

## 2018-04-25 DIAGNOSIS — R0789 Other chest pain: Secondary | ICD-10-CM | POA: Insufficient documentation

## 2018-04-25 DIAGNOSIS — R42 Dizziness and giddiness: Secondary | ICD-10-CM | POA: Insufficient documentation

## 2018-04-25 DIAGNOSIS — Z5321 Procedure and treatment not carried out due to patient leaving prior to being seen by health care provider: Secondary | ICD-10-CM | POA: Insufficient documentation

## 2018-04-25 LAB — CBC
HCT: 38.8 % — ABNORMAL LOW (ref 40.0–52.0)
Hemoglobin: 13.2 g/dL (ref 13.0–18.0)
MCH: 29.6 pg (ref 26.0–34.0)
MCHC: 34 g/dL (ref 32.0–36.0)
MCV: 87.2 fL (ref 80.0–100.0)
PLATELETS: 256 10*3/uL (ref 150–440)
RBC: 4.45 MIL/uL (ref 4.40–5.90)
RDW: 17.8 % — AB (ref 11.5–14.5)
WBC: 15.3 10*3/uL — ABNORMAL HIGH (ref 3.8–10.6)

## 2018-04-25 LAB — BASIC METABOLIC PANEL
Anion gap: 8 (ref 5–15)
BUN: 28 mg/dL — AB (ref 6–20)
CALCIUM: 8.6 mg/dL — AB (ref 8.9–10.3)
CHLORIDE: 105 mmol/L (ref 98–111)
CO2: 26 mmol/L (ref 22–32)
CREATININE: 1.31 mg/dL — AB (ref 0.61–1.24)
GFR calc non Af Amer: 58 mL/min — ABNORMAL LOW (ref >60)
GLUCOSE: 152 mg/dL — AB (ref 70–99)
Potassium: 4.1 mmol/L (ref 3.5–5.1)
Sodium: 139 mmol/L (ref 135–145)

## 2018-04-25 LAB — TROPONIN I

## 2018-04-25 NOTE — ED Triage Notes (Addendum)
Pt to triage via w/c with no distress noted; pt reports d/c yesterday from hosp and had med changes; has been having frequent falls due to weakness and dizziness; st cont to have left sided CP, nonradiating

## 2018-04-26 ENCOUNTER — Other Ambulatory Visit: Payer: Self-pay

## 2018-04-26 ENCOUNTER — Encounter: Payer: Self-pay | Admitting: Emergency Medicine

## 2018-04-26 ENCOUNTER — Emergency Department
Admission: EM | Admit: 2018-04-26 | Discharge: 2018-04-26 | Disposition: A | Discharge status: Home / Self Care | Payer: Medicare Other | Attending: Emergency Medicine | Admitting: Emergency Medicine

## 2018-04-26 ENCOUNTER — Emergency Department: Payer: Medicare Other

## 2018-04-26 ENCOUNTER — Telehealth: Payer: Self-pay | Admitting: Emergency Medicine

## 2018-04-26 DIAGNOSIS — Z96642 Presence of left artificial hip joint: Secondary | ICD-10-CM | POA: Diagnosis not present

## 2018-04-26 DIAGNOSIS — Z7982 Long term (current) use of aspirin: Secondary | ICD-10-CM | POA: Diagnosis not present

## 2018-04-26 DIAGNOSIS — R0789 Other chest pain: Secondary | ICD-10-CM | POA: Diagnosis present

## 2018-04-26 DIAGNOSIS — I110 Hypertensive heart disease with heart failure: Secondary | ICD-10-CM | POA: Insufficient documentation

## 2018-04-26 DIAGNOSIS — I5030 Unspecified diastolic (congestive) heart failure: Secondary | ICD-10-CM | POA: Diagnosis not present

## 2018-04-26 DIAGNOSIS — I2510 Atherosclerotic heart disease of native coronary artery without angina pectoris: Secondary | ICD-10-CM | POA: Insufficient documentation

## 2018-04-26 DIAGNOSIS — J449 Chronic obstructive pulmonary disease, unspecified: Secondary | ICD-10-CM | POA: Insufficient documentation

## 2018-04-26 DIAGNOSIS — Z87891 Personal history of nicotine dependence: Secondary | ICD-10-CM | POA: Insufficient documentation

## 2018-04-26 DIAGNOSIS — Z79899 Other long term (current) drug therapy: Secondary | ICD-10-CM | POA: Diagnosis not present

## 2018-04-26 LAB — COMPREHENSIVE METABOLIC PANEL
ALT: 22 U/L (ref 0–44)
AST: 20 U/L (ref 15–41)
Albumin: 3.4 g/dL — ABNORMAL LOW (ref 3.5–5.0)
Alkaline Phosphatase: 51 U/L (ref 38–126)
Anion gap: 6 (ref 5–15)
BILIRUBIN TOTAL: 0.5 mg/dL (ref 0.3–1.2)
BUN: 25 mg/dL — ABNORMAL HIGH (ref 6–20)
CALCIUM: 8.6 mg/dL — AB (ref 8.9–10.3)
CO2: 25 mmol/L (ref 22–32)
Chloride: 109 mmol/L (ref 98–111)
Creatinine, Ser: 1.39 mg/dL — ABNORMAL HIGH (ref 0.61–1.24)
GFR, EST NON AFRICAN AMERICAN: 54 mL/min — AB (ref >60)
Glucose, Bld: 132 mg/dL — ABNORMAL HIGH (ref 70–99)
Potassium: 4.1 mmol/L (ref 3.5–5.1)
Sodium: 140 mmol/L (ref 135–145)
Total Protein: 6.4 g/dL — ABNORMAL LOW (ref 6.5–8.1)

## 2018-04-26 LAB — CBC WITH DIFFERENTIAL/PLATELET
BASOS PCT: 0 %
Basophils Absolute: 0 10*3/uL (ref 0–0.1)
EOS PCT: 0 %
Eosinophils Absolute: 0 10*3/uL (ref 0–0.7)
HEMATOCRIT: 37.5 % — AB (ref 40.0–52.0)
Hemoglobin: 12.7 g/dL — ABNORMAL LOW (ref 13.0–18.0)
LYMPHS ABS: 1.6 10*3/uL (ref 1.0–3.6)
Lymphocytes Relative: 10 %
MCH: 29.8 pg (ref 26.0–34.0)
MCHC: 33.8 g/dL (ref 32.0–36.0)
MCV: 88.1 fL (ref 80.0–100.0)
MONOS PCT: 11 %
Monocytes Absolute: 1.7 10*3/uL — ABNORMAL HIGH (ref 0.2–1.0)
Neutro Abs: 12.6 10*3/uL — ABNORMAL HIGH (ref 1.4–6.5)
Neutrophils Relative %: 79 %
Platelets: 232 10*3/uL (ref 150–440)
RBC: 4.26 MIL/uL — ABNORMAL LOW (ref 4.40–5.90)
RDW: 18.2 % — ABNORMAL HIGH (ref 11.5–14.5)
WBC: 15.9 10*3/uL — AB (ref 3.8–10.6)

## 2018-04-26 LAB — BRAIN NATRIURETIC PEPTIDE: B Natriuretic Peptide: 89 pg/mL (ref 0.0–100.0)

## 2018-04-26 LAB — TROPONIN I: Troponin I: <0.03 ng/mL (ref <0.03)

## 2018-04-26 NOTE — ED Notes (Signed)
Cup of water provided per patient request.

## 2018-04-26 NOTE — ED Triage Notes (Signed)
Pt comes into the ED via [facility] from home c/o chest pain.  Given 324 aspirin, 1 nitro SL, 1"nitro paste, and 100 fentanyl.  Pain went from an 8 to a 6.  Patient recently discharged from the hospital.  Extensive cardiac history with surgical procedures in the past.  Patient also informed that his kidney functions were abnormal.  Patient states he has had multiple days of syncopal episodes as well.  Patient in NAD at this time with even and unlabored respirations.

## 2018-04-26 NOTE — Discharge Instructions (Addendum)
Would prefer to go home at this time.  This is certainly your choice but does limit our  ability to take care of you if you change your mind or feel worse in the way as described to you, please return to the emergency room

## 2018-04-26 NOTE — ED Notes (Signed)
Patient transported to X-ray 

## 2018-04-26 NOTE — Telephone Encounter (Signed)
Called patient due to lwot to inquire about condition and follow up plans. Says he has appt with pcp.  I asked him to call his pcp now to ask them to review his labs done here.  He says he continues to fall multiple times each day.  He agrees to call now.

## 2018-04-26 NOTE — ED Provider Notes (Addendum)
[facility] Emergency Department Provider Note  ____________________________________________   I have reviewed the triage vital signs and the nursing notes. Where available I have reviewed prior notes and, if possible and indicated, outside hospital notes.    HISTORY  Chief Complaint Chest Pain    HPI [name] PITA Sr. is a 62 y.o. male who is well-known to this ER and this facility.  He has a long comp gated history including ACS and atypical chest pain.  Patient has had extensive recent cardiac work-up from outpatient inpatient sources he tells me.  His most recent admission ended on the first of this month for atypical chest pain and he was seen by cardiology for that.  Patient went home and then apparently went directly to [facility] where he was complaining of back pain after sitting a chair collapsed under him.  There was less than 12 hours between his discharge from this facility and the time he was seen at that facility for different chief complaint that was also pain related.  He is on chronic narcotics and also has chronic Xanax prescriptions per the [facility] provider database.  He is here because he has had unchanged left-sided chest wall pain and a feeling of shortness of breath for 2 weeks without stop despite hospitalization.  The patient has been seen in this emergency room 6 times not including last night in which time he checked out without being seen for this pain and similar pains.  Is been admitted to the hospital twice.  Did leave AMA the first time, has been seen and evaluated by cardiologist for this.  Patient states he has a left-sided chest pain and is chronically short of breath.  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  He states the pain is been there every second of every day for the last 2 to 3 weeks.  Nothing makes it better nothing makes it worse.  He states sometimes he falls over.   The patient denies any trauma.  He has no acute leg swelling.  He has received fentanyl from the EMS and tells me that it is already worn off and he needs something else for pain.  She had a negative CT scan of his chest for this pain 6 weeks ago at [facility], and states it is never really gotten better.   Past Medical History:  Diagnosis Date  . Anxiety disorder   . Arthritis   . CAD (coronary artery disease)    a. ~ 2000 cath at [facility];  b. 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  . CHF (congestive heart failure) (HCC)   . Chronic back pain   . Chronic hip pain   . COPD (chronic obstructive pulmonary disease) (HCC)   . Depression   . Essential hypertension, benign   . GERD (gastroesophageal reflux disease)   . Headache(784.0)   . History of alcohol abuse   . History of cocaine abuse   . Hyperlipidemia   . Obesity   . Pneumonia     Patient Active Problem List   Diagnosis Date Noted  . NSTEMI (non-ST elevated myocardial infarction) (HCC) 07/11/2015  . Chest pain 04/30/2015  . Syncope 04/30/2015  . Orthostatic syncope 04/30/2015  . Chronic back pain   . (  HFpEF) heart failure with preserved ejection fraction (HCC)   . History of cocaine abuse   . History of alcohol abuse   . Prediabetes 08/12/2014  . Tobacco abuse 09/21/2013  . Sleep apnea- C-pap intol 09/14/2013  . Coronary atherosclerosis of native coronary artery   . OBESITY, MORBID 05/29/2008  . Dyslipidemia 01/19/2007  . ANXIETY DISORDER, GENERALIZED 01/19/2007  . Depression 10/20/2006  . Essential hypertension, benign 10/20/2006  . COPD (chronic obstructive pulmonary disease) (HCC) 10/20/2006    Past Surgical History:  Procedure Laterality Date  . BACK SURGERY    . CHOLECYSTECTOMY    . CORONARY ARTERY BYPASS GRAFT    . HIP SURGERY     rt  . JOINT REPLACEMENT    . LEFT HEART  CATHETERIZATION WITH CORONARY ANGIOGRAM N/A 09/12/2013   Procedure: LEFT HEART CATHETERIZATION WITH CORONARY ANGIOGRAM;  Surgeon: Kathleene Hazel, MD;  Location: [facility] CATH LAB;  Service: Cardiovascular;  Laterality: N/A;  . NASAL/SINUS ENDOSCOPY    . TOTAL HIP ARTHROPLASTY  05/08/2012   Procedure: TOTAL HIP ARTHROPLASTY;  Surgeon: Nestor Lewandowsky, MD;  Location: [facility] OR;  Service: Orthopedics;  Laterality: Left;  . TOTAL HIP ARTHROPLASTY  2014 per pt    Prior to Admission medications   Medication Sig Start Date End Date Taking? Authorizing Provider  aspirin 81 MG tablet Take 1 tablet (81 mg total) by mouth daily. 05/31/12   Kathlen Mody, MD  atorvastatin (LIPITOR) 10 MG tablet Take 1 tablet (10 mg total) by mouth daily. 04/23/18 05/23/18  Ihor Austin, MD  budesonide-formoterol (SYMBICORT) 160-4.5 MCG/ACT inhaler Inhale 2 puffs into the lungs 2 (two) times daily.    [provider]  buPROPion (WELLBUTRIN XL) 150 MG 24 hr tablet Take 1 tablet (150 mg total) by mouth daily for 15 days. 04/23/18 05/08/18  Ihor Austin, MD  carvedilol (COREG) 3.125 MG tablet Take 1 tablet by mouth 2 (two) times daily. 02/21/18   [provider]  citalopram (CELEXA) 10 MG tablet Take 10 mg by mouth every morning.    [provider]  [facility] MELATONIN 3 MG TABS Take 1 tablet by mouth at bedtime as needed. 03/22/18   [provider]  doxepin (SINEQUAN) 25 MG capsule Take 25 mg by mouth at bedtime.  08/06/14   [provider]  furosemide (LASIX) 40 MG tablet Take 20 mg by mouth every morning.     [provider]  gabapentin (NEURONTIN) 300 MG capsule Take 1 capsule (300 mg total) by mouth 3 (three) times daily. 04/23/18 05/23/18  Ihor Austin, MD  ipratropium-albuterol (DUONEB) 0.5-2.5 (3) MG/3ML SOLN Take 3 mLs by nebulization every 6 (six) hours as needed (shortness of breath/ wheezing.).     [provider]  isosorbide mononitrate (IMDUR) 30 MG 24 hr tablet Take 1  tablet (30 mg total) by mouth 2 (two) times daily. Patient taking differently: Take 60 mg by mouth daily.  08/12/14   Dhungel, Theda Belfast, MD  metoprolol tartrate (LOPRESSOR) 25 MG tablet Take 1 tablet (25 mg total) by mouth daily. 04/24/18 05/24/18  Ihor Austin, MD  nitroGLYCERIN (NITROSTAT) 0.4 MG SL tablet Place 0.4 mg under the tongue every 5 (five) minutes as needed. For chest pain    [provider]  omeprazole (PRILOSEC) 20 MG capsule Take 20 mg by mouth every morning.     [provider]  oxyCODONE-acetaminophen (PERCOCET) 10-325 MG tablet Take 1 tablet by mouth every 4 (four) hours as needed for pain.    [provider]  potassium chloride SA (K-DUR,KLOR-CON) 20 MEQ tablet Take 20 mEq by mouth every morning.  08/13/13   [provider]  predniSONE (DELTASONE) 10 MG tablet Take 1 tablet (10 mg total) by mouth daily. Label  & dispense according to the schedule below.  6 tablets day one, then 5 table day 2, then 4 tablets day 3, then 3 tablets day 4, 2 tablets day 5, then 1 tablet day 6, then stop 04/23/18   Ihor Austin, MD  PROAIR HFA 108 (90 BASE) MCG/ACT inhaler Inhale 2 puffs into the lungs daily as needed for wheezing or shortness of breath.  06/20/13   [provider]  tiotropium (SPIRIVA) 18 MCG inhalation capsule Place 18 mcg into inhaler and inhale daily.    [provider]  traZODone (DESYREL) 50 MG tablet Take 1-2 tablets by mouth at bedtime. 03/22/18   [provider]  VITAMIN E PO Take 1 tablet by mouth daily.    [provider]    Allergies Ativan [lorazepam]; Haloperidol decanoate; and Tiotropium  Family History  Problem Relation Age of Onset  . Heart attack Mother   . Heart attack Brother   . Heart attack Sister   . Heart attack Father     Social History Social History   Tobacco Use  . Smoking status: Former Smoker    Packs/day: 0.50    Years: 39.00    Pack years: 19.50    Types: Cigarettes     Last attempt to quit: 09/15/2015    Years since quitting: 2.6  . Smokeless tobacco: Never Used  Substance Use Topics  . Alcohol use: Yes    Alcohol/week: 0.0 standard drinks    Comment: socially; reports drinking 2 beers last nigh  . Drug use: No    Review of Systems Constitutional: No fever/chills Eyes: No visual changes. ENT: No sore throat. No stiff neck no neck pain Cardiovascular: + chest pain. Respiratory: + shortness of breath. Gastrointestinal:   no vomiting.  No diarrhea.  No constipation. Genitourinary: Negative for dysuria. Musculoskeletal: Negative lower extremity swelling Skin: Negative for rash. Neurological: Negative for severe headaches, focal weakness or numbness.   ____________________________________________   PHYSICAL EXAM:  VITAL SIGNS: ED Triage Vitals  Enc Vitals Group     BP 04/26/18 1800 119/83     Pulse Rate 04/26/18 1800 62     Resp 04/26/18 1800 18     Temp 04/26/18 1800 99 F (37.2 C)     Temp Source 04/26/18 1800 Axillary     SpO2 04/26/18 1806 96 %     Weight 04/26/18 1807 (!) 360 lb 0.2 oz (163.3 kg)     Height 04/26/18 1807 6\' 3"  (1.905 m)     Head Circumference --      Peak Flow --      Pain Score 04/26/18 1806 7     Pain Loc --      Pain Edu? --      Excl. in GC? --     Constitutional: Alert and oriented. Well appearing and in no acute distress. Eyes: Conjunctivae are normal Head: Atraumatic HEENT: No congestion/rhinnorhea. Mucous membranes are moist.  Oropharynx non-erythematous Neck:   Nontender with no meningismus, no masses, no stridor Cardiovascular: Normal rate, regular rhythm. Grossly normal heart sounds.  Good peripheral circulation. Chest: Considerable scar tissue noted, no active infection indicated, patient has tenderness palpation of left chest wall when I touch this area he states "ouch that the pain right  there" pullback.  There is no redness erythema warmth to touch crepitus or flail chest in that area.  No rib  fracture palpated either. Respiratory: Normal respiratory effort.  No retractions. Lungs CTAB. Abdominal: Soft and nontender. No distention. No guarding no rebound Back:  There is no focal tenderness or step off.  there is no midline tenderness there are no lesions noted. there is no CVA tenderness Musculoskeletal: No lower extremity tenderness, no upper extremity tenderness. No joint effusions, no DVT signs strong distal pulses mild chronic looking bilateral edema Neurologic:  Normal speech and language. No gross focal neurologic deficits are appreciated.  Skin:  Skin is warm, dry and intact. No rash noted. Psychiatric: Mood and affect are normal. Speech and behavior are normal.  ____________________________________________   LABS (all labs ordered are listed, but only abnormal results are displayed)  Labs Reviewed  COMPREHENSIVE METABOLIC PANEL - Abnormal; Notable for the following components:      Result Value   Glucose, Bld 132 (*)    BUN 25 (*)    Creatinine, Ser 1.39 (*)    Calcium 8.6 (*)    Total Protein 6.4 (*)    Albumin 3.4 (*)    GFR calc non Af Amer 54 (*)    All other components within normal limits  TROPONIN I  BRAIN NATRIURETIC PEPTIDE  CBC WITH DIFFERENTIAL/PLATELET  URINE DRUG SCREEN, QUALITATIVE ([facility] ONLY)    Pertinent labs  results that were available during my care of the patient were reviewed by me and considered in my medical decision making (see chart for details). ____________________________________________  EKG  I personally interpreted any EKGs ordered by me or triage Sinus rhythm at 62 bpm no acute ST elevation or depression normal axis unremarkable EKG ____________________________________________  RADIOLOGY  Pertinent labs & imaging results that were available during my care of the patient were reviewed by me and considered in my medical decision making (see chart for details). If possible, patient and/or family made aware of any abnormal  findings.  No results found. ____________________________________________    PROCEDURES  Procedure(s) performed: None  Procedures  Critical Care performed: None  ____________________________________________   INITIAL IMPRESSION / ASSESSMENT AND PLAN / ED COURSE  Pertinent labs & imaging results that were available during my care of the patient were reviewed by me and considered in my medical decision making (see chart for details).  Patient here with reproducible chest wall pain, and the desire to have narcotics for reproducible chest wall pain.  Certainly has risk factors for multiple different other entities.  PE is not thought to be likely given the nature of the pain and he had a negative CT scan of his chest to rule out both dissection and PE to the extent possible 6 weeks ago for this exact same pain.  He states his been no change in that pain since that time.  Patient has had 2 recent admissions for this and went home both times.  The most recent admission and discharge after cardiology evaluation was 2 days ago.  Patient has since gone to another hospital for a different pain complaint in the meantime.  He is requesting narcotic pain medication here.  I do not see an indication for acute narcotic ministration and I will not do that at this time.  Will however of course rule him out for cardiac ischemia which I have very low suspicion.  Patient is in no acute distress and speaks in full sentences.Marland Kitchen  ----------------------------------------- 7:19 PM on  04/26/2018 -----------------------------------------  The patient is requesting pain medications which we will defer at this time  ----------------------------------------- 7:35 PM on 04/26/2018 -----------------------------------------  After being informed that we would not give him narcotic pain medications during this visit as I do not see its indicated at this time, patient stated that he wanted to be discharged she wants  all the wires off and he wants to go home.  Patient states that he is leaving AMA.  He and I had a long talk about my willingness to talk to his cardiologist and if indicated admit him to the hospital, working him up for his chronic chest pain and continue to evaluate him however, patient refused all of that he states that he wants to go at this time.  He is I think capable of making this decision.  He understands the risk benefits and alternatives of departure.  Patient routinely does this and it is not atypical for him to want to do it again.   ____________________________________________   FINAL CLINICAL IMPRESSION(S) / ED DIAGNOSES  Final diagnoses:  None      This chart was dictated using voice recognition software.  Despite best efforts to proofread,  errors can occur which can change meaning.     Jeanmarie Plant, MD 04/26/18 1858    Jeanmarie Plant, MD 04/26/18 Jerene Bears    Jeanmarie Plant, MD 04/26/18 [phone]

## 2018-05-03 ENCOUNTER — Emergency Department: Payer: Medicare Other

## 2018-05-03 ENCOUNTER — Other Ambulatory Visit: Payer: Self-pay

## 2018-05-03 ENCOUNTER — Emergency Department
Admission: EM | Admit: 2018-05-03 | Discharge: 2018-05-03 | Disposition: A | Discharge status: Home / Self Care | Payer: Medicare Other | Attending: Emergency Medicine | Admitting: Emergency Medicine

## 2018-05-03 DIAGNOSIS — Z5321 Procedure and treatment not carried out due to patient leaving prior to being seen by health care provider: Secondary | ICD-10-CM | POA: Insufficient documentation

## 2018-05-03 DIAGNOSIS — R0602 Shortness of breath: Secondary | ICD-10-CM | POA: Diagnosis not present

## 2018-05-03 DIAGNOSIS — R600 Localized edema: Secondary | ICD-10-CM | POA: Insufficient documentation

## 2018-05-03 LAB — BASIC METABOLIC PANEL
Anion gap: 6 (ref 5–15)
BUN: 13 mg/dL (ref 6–20)
CHLORIDE: 111 mmol/L (ref 98–111)
CO2: 23 mmol/L (ref 22–32)
CREATININE: 1.21 mg/dL (ref 0.61–1.24)
Calcium: 8.8 mg/dL — ABNORMAL LOW (ref 8.9–10.3)
GFR calc non Af Amer: >60 mL/min (ref >60)
Glucose, Bld: 112 mg/dL — ABNORMAL HIGH (ref 70–99)
Potassium: 3.7 mmol/L (ref 3.5–5.1)
SODIUM: 140 mmol/L (ref 135–145)

## 2018-05-03 LAB — CBC
HCT: 38.4 % — ABNORMAL LOW (ref 40.0–52.0)
Hemoglobin: 12.8 g/dL — ABNORMAL LOW (ref 13.0–18.0)
MCH: 29.4 pg (ref 26.0–34.0)
MCHC: 33.4 g/dL (ref 32.0–36.0)
MCV: 87.9 fL (ref 80.0–100.0)
PLATELETS: 187 10*3/uL (ref 150–440)
RBC: 4.37 MIL/uL — AB (ref 4.40–5.90)
RDW: 18.3 % — AB (ref 11.5–14.5)
WBC: 9.5 10*3/uL (ref 3.8–10.6)

## 2018-05-03 LAB — BRAIN NATRIURETIC PEPTIDE: B Natriuretic Peptide: 55 pg/mL (ref 0.0–100.0)

## 2018-05-03 LAB — TROPONIN I

## 2018-05-03 NOTE — ED Triage Notes (Signed)
Pt arrives to ED via POV from home with c/o edema and SHOB. Pt reports h/x of CHF and states he's "overloaded with fluid". Pt also c/o chest pain, but states "it's from all the fluid". Pt reports (+) nausea and diarrhea. Pt is A&O, in NAD, RR even, regular, and unlabored, skin color/temp is WNL.

## 2018-05-03 NOTE — ED Notes (Signed)
Pt reports that he leaving now, unable to wait any longer; pt encouraged to stay and be eval further by ED provider but declines

## 2018-05-04 ENCOUNTER — Telehealth: Payer: Self-pay | Admitting: Emergency Medicine

## 2018-05-04 NOTE — Telephone Encounter (Signed)
Called patient due to lwot to inquire about condition and follow up plans.  Says he plans to go to pcp in [place] today.  I told him the lab results are in and that he should notify his pcp to get records or look in epic if possible.

## 2018-05-10 ENCOUNTER — Other Ambulatory Visit: Payer: Self-pay

## 2018-05-10 ENCOUNTER — Encounter: Payer: Self-pay | Admitting: *Deleted

## 2018-05-10 ENCOUNTER — Emergency Department
Admission: EM | Admit: 2018-05-10 | Discharge: 2018-05-10 | Disposition: A | Discharge status: Home / Self Care | Payer: Medicare Other | Attending: Emergency Medicine | Admitting: Emergency Medicine

## 2018-05-10 DIAGNOSIS — M545 Low back pain: Secondary | ICD-10-CM | POA: Diagnosis present

## 2018-05-10 DIAGNOSIS — M62830 Muscle spasm of back: Secondary | ICD-10-CM | POA: Insufficient documentation

## 2018-05-10 DIAGNOSIS — Z96642 Presence of left artificial hip joint: Secondary | ICD-10-CM | POA: Insufficient documentation

## 2018-05-10 DIAGNOSIS — Z7982 Long term (current) use of aspirin: Secondary | ICD-10-CM | POA: Diagnosis not present

## 2018-05-10 DIAGNOSIS — I509 Heart failure, unspecified: Secondary | ICD-10-CM | POA: Insufficient documentation

## 2018-05-10 DIAGNOSIS — X58XXXS Exposure to other specified factors, sequela: Secondary | ICD-10-CM | POA: Diagnosis not present

## 2018-05-10 DIAGNOSIS — Z79899 Other long term (current) drug therapy: Secondary | ICD-10-CM | POA: Insufficient documentation

## 2018-05-10 DIAGNOSIS — I110 Hypertensive heart disease with heart failure: Secondary | ICD-10-CM | POA: Insufficient documentation

## 2018-05-10 DIAGNOSIS — I2510 Atherosclerotic heart disease of native coronary artery without angina pectoris: Secondary | ICD-10-CM | POA: Diagnosis not present

## 2018-05-10 DIAGNOSIS — G8929 Other chronic pain: Secondary | ICD-10-CM

## 2018-05-10 DIAGNOSIS — S300XXS Contusion of lower back and pelvis, sequela: Secondary | ICD-10-CM | POA: Insufficient documentation

## 2018-05-10 DIAGNOSIS — S20229S Contusion of unspecified back wall of thorax, sequela: Secondary | ICD-10-CM

## 2018-05-10 DIAGNOSIS — J449 Chronic obstructive pulmonary disease, unspecified: Secondary | ICD-10-CM | POA: Insufficient documentation

## 2018-05-10 DIAGNOSIS — Z87891 Personal history of nicotine dependence: Secondary | ICD-10-CM | POA: Diagnosis not present

## 2018-05-10 MED ORDER — CYCLOBENZAPRINE HCL 10 MG PO TABS
10.0000 mg | ORAL_TABLET | Freq: Once | ORAL | Status: AC
  Administered 2018-05-10: 10 mg via ORAL
  Filled 2018-05-10: qty 1

## 2018-05-10 MED ORDER — CYCLOBENZAPRINE HCL 10 MG PO TABS: 10.0000 mg | ORAL_TABLET | Freq: Three times a day (TID) | ORAL | 0 refills | Status: AC | PRN

## 2018-05-10 NOTE — ED Triage Notes (Signed)
Pt has lower back pain   Pt reports falling the first of the month and continues to have pain.  Pt has chronic pain and goes to pain clinic.  Pt alert.

## 2018-05-10 NOTE — Discharge Instructions (Addendum)
Your exam is consistent with muscle strain and spasms, related to your recent fall.  Your previous x-rays are negative for any acute fracture or dislocation. Take the prescription muscle relaxant as needed. Take your home meds as directed. Apply ice or moist heat to reduce pain and stiffness. Follow-up with your provider for ongoing symptoms. You can expect to be sore for several weeks after your injury.

## 2018-05-11 NOTE — ED Provider Notes (Signed)
[facility] Emergency Department Provider Note ____________________________________________  Time seen: 1745  I have reviewed the triage vital signs and the nursing notes.  HISTORY  Chief Complaint  Back Pain  HPI [name] Sr. is a 62 y.o. male sent to the ED for evaluation of low back pain.  Patient gives a history of chronic low back pain, describes a mechanical fall on 9/2.  He reported to [facility] for evaluation and x-rays of his sacrum and lumbar spine were negative for any acute fracture dislocation.  Into the patient, he was told he was "bruised inside and out."  He was also told that his soft tissue injuries would probably take anywhere from 6 to 8 weeks to heal completely.  He was discharged with instruction to take his home medications as prescribed.  He presents today, noting continued low back pain.  He denies any interim injury, fall, or trip.  He also denies any bladder or bowel incontinence, saddle anesthesias, or distal paresthesias.  Patient localizes pain to the lumbar sacral junction.  He has been taking his home oxygen codon, but continues to note pain to the lower back.  He has not followed up with his primary care provider in the interim.  He presents today with questions as to why he still feels pain to his lower back and buttocks.  Past Medical History:  Diagnosis Date  . Anxiety disorder   . Arthritis   . CAD (coronary artery disease)    a. ~ 2000 cath at [facility];  b. 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  . CHF (congestive heart failure) (HCC)   . Chronic back pain   . Chronic hip pain   . COPD (chronic obstructive pulmonary disease) (HCC)   . Depression   . Essential hypertension, benign   . GERD (gastroesophageal reflux disease)   . Headache(784.0)   . History of alcohol abuse   . History of  cocaine abuse   . Hyperlipidemia   . Obesity   . Pneumonia     Patient Active Problem List   Diagnosis Date Noted  . NSTEMI (non-ST elevated myocardial infarction) (HCC) 07/11/2015  . Chest pain 04/30/2015  . Syncope 04/30/2015  . Orthostatic syncope 04/30/2015  . Chronic back pain   . (HFpEF) heart failure with preserved ejection fraction (HCC)   . History of cocaine abuse   . History of alcohol abuse   . Prediabetes 08/12/2014  . Tobacco abuse 09/21/2013  . Sleep apnea- C-pap intol 09/14/2013  . Coronary atherosclerosis of native coronary artery   . OBESITY, MORBID 05/29/2008  . Dyslipidemia 01/19/2007  . ANXIETY DISORDER, GENERALIZED 01/19/2007  . Depression 10/20/2006  . Essential hypertension, benign 10/20/2006  . COPD (chronic obstructive pulmonary disease) (HCC) 10/20/2006    Past Surgical History:  Procedure Laterality Date  . BACK SURGERY    . CHOLECYSTECTOMY    . CORONARY ARTERY BYPASS GRAFT    . HIP SURGERY     rt  . JOINT REPLACEMENT    . LEFT HEART CATHETERIZATION WITH CORONARY ANGIOGRAM N/A 09/12/2013   Procedure: LEFT HEART CATHETERIZATION WITH CORONARY ANGIOGRAM;  Surgeon: Kathleene Hazel, MD;  Location: [facility] CATH LAB;  Service: Cardiovascular;  Laterality: N/A;  . NASAL/SINUS ENDOSCOPY    . TOTAL HIP ARTHROPLASTY  05/08/2012   Procedure: TOTAL HIP  ARTHROPLASTY;  Surgeon: Nestor Lewandowsky, MD;  Location: [facility] OR;  Service: Orthopedics;  Laterality: Left;  . TOTAL HIP ARTHROPLASTY  2014 per pt    Prior to Admission medications   Medication Sig Start Date End Date Taking? Authorizing Provider  aspirin 81 MG tablet Take 1 tablet (81 mg total) by mouth daily. 05/31/12   Kathlen Mody, MD  atorvastatin (LIPITOR) 10 MG tablet Take 1 tablet (10 mg total) by mouth daily. 04/23/18 05/23/18  Ihor Austin, MD  budesonide-formoterol (SYMBICORT) 160-4.5 MCG/ACT inhaler Inhale 2 puffs into the lungs 2 (two) times daily.    [provider]  buPROPion (WELLBUTRIN  XL) 150 MG 24 hr tablet Take 1 tablet (150 mg total) by mouth daily for 15 days. 04/23/18 05/08/18  Ihor Austin, MD  carvedilol (COREG) 3.125 MG tablet Take 1 tablet by mouth 2 (two) times daily. 02/21/18   [provider]  citalopram (CELEXA) 10 MG tablet Take 10 mg by mouth every morning.    [provider]  [facility] MELATONIN 3 MG TABS Take 1 tablet by mouth at bedtime as needed. 03/22/18   [provider]  cyclobenzaprine (FLEXERIL) 10 MG tablet Take 1 tablet (10 mg total) by mouth 3 (three) times daily as needed for up to 10 days. 05/10/18 05/20/18  Jock Mahon, Charlesetta Ivory, PA-C  doxepin (SINEQUAN) 25 MG capsule Take 25 mg by mouth at bedtime.  08/06/14   [provider]  furosemide (LASIX) 40 MG tablet Take 20 mg by mouth every morning.     [provider]  gabapentin (NEURONTIN) 300 MG capsule Take 1 capsule (300 mg total) by mouth 3 (three) times daily. 04/23/18 05/23/18  Ihor Austin, MD  ipratropium-albuterol (DUONEB) 0.5-2.5 (3) MG/3ML SOLN Take 3 mLs by nebulization every 6 (six) hours as needed (shortness of breath/ wheezing.).     [provider]  isosorbide mononitrate (IMDUR) 30 MG 24 hr tablet Take 1 tablet (30 mg total) by mouth 2 (two) times daily. Patient taking differently: Take 60 mg by mouth daily.  08/12/14   Dhungel, Theda Belfast, MD  metoprolol tartrate (LOPRESSOR) 25 MG tablet Take 1 tablet (25 mg total) by mouth daily. 04/24/18 05/24/18  Ihor Austin, MD  nitroGLYCERIN (NITROSTAT) 0.4 MG SL tablet Place 0.4 mg under the tongue every 5 (five) minutes as needed. For chest pain    [provider]  omeprazole (PRILOSEC) 20 MG capsule Take 20 mg by mouth every morning.     [provider]  oxyCODONE-acetaminophen (PERCOCET) 10-325 MG tablet Take 1 tablet by mouth every 4 (four) hours as needed for pain.    [provider]  potassium chloride SA (K-DUR,KLOR-CON) 20 MEQ tablet Take 20 mEq by mouth every morning.   08/13/13   [provider]  predniSONE (DELTASONE) 10 MG tablet Take 1 tablet (10 mg total) by mouth daily. Label  & dispense according to the schedule below.  6 tablets day one, then 5 table day 2, then 4 tablets day 3, then 3 tablets day 4, 2 tablets day 5, then 1 tablet day 6, then stop 04/23/18   Ihor Austin, MD  PROAIR HFA 108 (90 BASE) MCG/ACT inhaler Inhale 2 puffs into the lungs daily as needed for wheezing or shortness of breath.  06/20/13   [provider]  tiotropium (SPIRIVA) 18 MCG inhalation capsule Place 18 mcg into inhaler and inhale daily.    [provider]  traZODone (DESYREL) 50 MG tablet Take 1-2 tablets by mouth  at bedtime. 03/22/18   [provider]  VITAMIN E PO Take 1 tablet by mouth daily.    [provider]    Allergies Ativan [lorazepam]; Haloperidol decanoate; and Tiotropium  Family History  Problem Relation Age of Onset  . Heart attack Mother   . Heart attack Brother   . Heart attack Sister   . Heart attack Father     Social History Social History   Tobacco Use  . Smoking status: Former Smoker    Packs/day: 0.50    Years: 39.00    Pack years: 19.50    Types: Cigarettes    Last attempt to quit: 09/15/2015    Years since quitting: 2.6  . Smokeless tobacco: Never Used  Substance Use Topics  . Alcohol use: Yes    Alcohol/week: 0.0 standard drinks    Comment: socially; reports drinking 2 beers last nigh  . Drug use: No    Review of Systems  Constitutional: Negative for fever. Eyes: Negative for visual changes. ENT: Negative for sore throat. Cardiovascular: Negative for chest pain. Respiratory: Negative for shortness of breath. Gastrointestinal: Negative for abdominal pain, vomiting and diarrhea. Genitourinary: Negative for dysuria. Musculoskeletal: Positive for back pain. Skin: Negative for rash. Neurological: Negative for headaches, focal weakness or  numbness. ____________________________________________  PHYSICAL EXAM:  VITAL SIGNS: ED Triage Vitals  Enc Vitals Group     BP 05/10/18 1718 (!) 123/108     Pulse Rate 05/10/18 1718 63     Resp 05/10/18 1718 20     Temp --      Temp src --      SpO2 05/10/18 1718 96 %     Weight 05/10/18 1716 (!) 360 lb (163.3 kg)     Height 05/10/18 1716 6\' 3"  (1.905 m)     Head Circumference --      Peak Flow --      Pain Score 05/10/18 1716 0     Pain Loc --      Pain Edu? --      Excl. in GC? --     Constitutional: Alert and oriented. Well appearing and in no distress. Head: Normocephalic and atraumatic. Eyes: Conjunctivae are normal. Normal extraocular movements Cardiovascular: Normal rate, regular rhythm. Normal distal pulses. Respiratory: Normal respiratory effort. No wheezes/rales/rhonchi. Gastrointestinal: Soft and nontender. No distention. Musculoskeletal: Normal spinal alignment without midline tenderness, spasm, deformity, or step-off.  Patient able to transition from supine to sit without assistance.  Nontender with normal range of motion in all extremities.  Neurologic: Cranial nerves II through XII grossly intact.  Normal LE DTRs bilaterally.  Normal gait without ataxia. Normal speech and language. No gross focal neurologic deficits are appreciated. Skin:  Skin is warm, dry and intact. No rash noted. ____________________________________________  PROCEDURES  Procedures Cyclobenzaprine 10 mg p.o. ____________________________________________  INITIAL IMPRESSION / ASSESSMENT AND PLAN / ED COURSE  Patient with ED evaluation of continued low back pain following a mechanical fall.  Patient's exam is overall benign.  No acute neuromuscular deficit is appreciated.  Review of his chart does confirm negative x-rays of the lumbar sacral spine and sacrum, on the date of injury.  Patient is advised to continue with his home medications.  We will add muscle relaxants to his regimen for  muscle pain relief.  Patient is also encouraged to apply ice and/or heat to the lower back region for pain relief.  He verbalizes understanding, and will be discharged with a prescription as discussed.  He will  follow-up with his primary care provider and expect the symptoms may persist for another couple weeks before they improved to baseline. ____________________________________________  FINAL CLINICAL IMPRESSION(S) / ED DIAGNOSES  Final diagnoses:  Contusion of back, unspecified laterality, sequela  Chronic bilateral low back pain without sciatica  Muscle spasm of back      Lissa Hoard, PA-C 05/11/18 0023    Dionne Bucy, MD 05/18/18 1505

## 2018-07-25 ENCOUNTER — Other Ambulatory Visit: Payer: Self-pay

## 2018-07-25 ENCOUNTER — Emergency Department: Payer: Medicare Other

## 2018-07-25 ENCOUNTER — Observation Stay
Admission: EM | Admit: 2018-07-25 | Discharge: 2018-07-26 | Disposition: A | Discharge status: Home / Self Care | Payer: Medicare Other | Attending: Internal Medicine | Admitting: Internal Medicine

## 2018-07-25 DIAGNOSIS — Z7951 Long term (current) use of inhaled steroids: Secondary | ICD-10-CM | POA: Diagnosis not present

## 2018-07-25 DIAGNOSIS — I110 Hypertensive heart disease with heart failure: Secondary | ICD-10-CM | POA: Insufficient documentation

## 2018-07-25 DIAGNOSIS — E0590 Thyrotoxicosis, unspecified without thyrotoxic crisis or storm: Secondary | ICD-10-CM | POA: Insufficient documentation

## 2018-07-25 DIAGNOSIS — Z79899 Other long term (current) drug therapy: Secondary | ICD-10-CM | POA: Insufficient documentation

## 2018-07-25 DIAGNOSIS — Z951 Presence of aortocoronary bypass graft: Secondary | ICD-10-CM | POA: Diagnosis not present

## 2018-07-25 DIAGNOSIS — I10 Essential (primary) hypertension: Secondary | ICD-10-CM | POA: Diagnosis present

## 2018-07-25 DIAGNOSIS — Z8249 Family history of ischemic heart disease and other diseases of the circulatory system: Secondary | ICD-10-CM | POA: Diagnosis not present

## 2018-07-25 DIAGNOSIS — Z96642 Presence of left artificial hip joint: Secondary | ICD-10-CM | POA: Diagnosis not present

## 2018-07-25 DIAGNOSIS — J810 Acute pulmonary edema: Secondary | ICD-10-CM

## 2018-07-25 DIAGNOSIS — I252 Old myocardial infarction: Secondary | ICD-10-CM | POA: Insufficient documentation

## 2018-07-25 DIAGNOSIS — Z6841 Body Mass Index (BMI) 40.0 and over, adult: Secondary | ICD-10-CM | POA: Diagnosis not present

## 2018-07-25 DIAGNOSIS — Z7982 Long term (current) use of aspirin: Secondary | ICD-10-CM | POA: Insufficient documentation

## 2018-07-25 DIAGNOSIS — Z87891 Personal history of nicotine dependence: Secondary | ICD-10-CM | POA: Diagnosis not present

## 2018-07-25 DIAGNOSIS — G4730 Sleep apnea, unspecified: Secondary | ICD-10-CM | POA: Insufficient documentation

## 2018-07-25 DIAGNOSIS — R0789 Other chest pain: Secondary | ICD-10-CM | POA: Diagnosis not present

## 2018-07-25 DIAGNOSIS — K219 Gastro-esophageal reflux disease without esophagitis: Secondary | ICD-10-CM | POA: Diagnosis not present

## 2018-07-25 DIAGNOSIS — I25118 Atherosclerotic heart disease of native coronary artery with other forms of angina pectoris: Secondary | ICD-10-CM | POA: Diagnosis present

## 2018-07-25 DIAGNOSIS — Z7952 Long term (current) use of systemic steroids: Secondary | ICD-10-CM | POA: Diagnosis not present

## 2018-07-25 DIAGNOSIS — F329 Major depressive disorder, single episode, unspecified: Secondary | ICD-10-CM | POA: Diagnosis not present

## 2018-07-25 DIAGNOSIS — Z955 Presence of coronary angioplasty implant and graft: Secondary | ICD-10-CM | POA: Insufficient documentation

## 2018-07-25 DIAGNOSIS — J449 Chronic obstructive pulmonary disease, unspecified: Secondary | ICD-10-CM | POA: Diagnosis present

## 2018-07-25 DIAGNOSIS — I5030 Unspecified diastolic (congestive) heart failure: Secondary | ICD-10-CM | POA: Diagnosis not present

## 2018-07-25 DIAGNOSIS — R079 Chest pain, unspecified: Secondary | ICD-10-CM | POA: Diagnosis present

## 2018-07-25 DIAGNOSIS — I2510 Atherosclerotic heart disease of native coronary artery without angina pectoris: Secondary | ICD-10-CM | POA: Diagnosis not present

## 2018-07-25 DIAGNOSIS — E876 Hypokalemia: Secondary | ICD-10-CM | POA: Insufficient documentation

## 2018-07-25 DIAGNOSIS — F411 Generalized anxiety disorder: Secondary | ICD-10-CM | POA: Diagnosis not present

## 2018-07-25 LAB — COMPREHENSIVE METABOLIC PANEL
ALK PHOS: 76 U/L (ref 38–126)
ALT: 15 U/L (ref 0–44)
AST: 23 U/L (ref 15–41)
Albumin: 4 g/dL (ref 3.5–5.0)
Anion gap: 9 (ref 5–15)
BILIRUBIN TOTAL: 0.5 mg/dL (ref 0.3–1.2)
BUN: 12 mg/dL (ref 6–20)
CALCIUM: 9.1 mg/dL (ref 8.9–10.3)
CO2: 22 mmol/L (ref 22–32)
CREATININE: 1.21 mg/dL (ref 0.61–1.24)
Chloride: 105 mmol/L (ref 98–111)
GFR calc non Af Amer: >60 mL/min (ref >60)
Glucose, Bld: 148 mg/dL — ABNORMAL HIGH (ref 70–99)
Potassium: 3.6 mmol/L (ref 3.5–5.1)
Sodium: 136 mmol/L (ref 135–145)
TOTAL PROTEIN: 7.8 g/dL (ref 6.5–8.1)

## 2018-07-25 LAB — TROPONIN I

## 2018-07-25 LAB — CBC WITH DIFFERENTIAL/PLATELET
Abs Immature Granulocytes: 0.04 10*3/uL (ref 0.00–0.07)
Basophils Absolute: 0.1 10*3/uL (ref 0.0–0.1)
Basophils Relative: 1 %
EOS PCT: 5 %
Eosinophils Absolute: 0.5 10*3/uL (ref 0.0–0.5)
HCT: 40.9 % (ref 39.0–52.0)
HEMOGLOBIN: 13.7 g/dL (ref 13.0–17.0)
Immature Granulocytes: 0 %
LYMPHS PCT: 24 %
Lymphs Abs: 2.4 10*3/uL (ref 0.7–4.0)
MCH: 31.4 pg (ref 26.0–34.0)
MCHC: 33.5 g/dL (ref 30.0–36.0)
MCV: 93.6 fL (ref 80.0–100.0)
MONO ABS: 1.2 10*3/uL — AB (ref 0.1–1.0)
MONOS PCT: 12 %
Neutro Abs: 5.7 10*3/uL (ref 1.7–7.7)
Neutrophils Relative %: 58 %
Platelets: 245 10*3/uL (ref 150–400)
RBC: 4.37 MIL/uL (ref 4.22–5.81)
RDW: 12.3 % (ref 11.5–15.5)
WBC: 10 10*3/uL (ref 4.0–10.5)
nRBC: 0 % (ref 0.0–0.2)

## 2018-07-25 LAB — BRAIN NATRIURETIC PEPTIDE: B Natriuretic Peptide: 17 pg/mL (ref 0.0–100.0)

## 2018-07-25 MED ORDER — ENALAPRILAT 1.25 MG/ML IV SOLN
1.2500 mg | Freq: Once | INTRAVENOUS | Status: AC
  Administered 2018-07-26: 1.25 mg via INTRAVENOUS
  Filled 2018-07-25: qty 2

## 2018-07-25 MED ORDER — NITROGLYCERIN 0.4 MG SL SUBL
0.4000 mg | SUBLINGUAL_TABLET | SUBLINGUAL | Status: DC | PRN
  Administered 2018-07-25: 0.4 mg via SUBLINGUAL
  Filled 2018-07-25: qty 1

## 2018-07-25 MED ORDER — MORPHINE SULFATE (PF) 4 MG/ML IV SOLN
4.0000 mg | Freq: Once | INTRAVENOUS | Status: AC
  Administered 2018-07-26: 4 mg via INTRAVENOUS
  Filled 2018-07-25: qty 1

## 2018-07-25 MED ORDER — FUROSEMIDE 10 MG/ML IJ SOLN
40.0000 mg | Freq: Once | INTRAMUSCULAR | Status: AC
  Administered 2018-07-26: 40 mg via INTRAVENOUS
  Filled 2018-07-25: qty 4

## 2018-07-25 NOTE — ED Provider Notes (Signed)
[facility] Emergency Department Provider Note  ____________________________________________   First MD Initiated Contact with Patient 07/25/18 2300     (approximate)  I have reviewed the triage vital signs and the nursing notes.   HISTORY  Chief Complaint Chest Pain   HPI [name] JUBY Sr. is a 62 y.o. male who comes to the emergency department via EMS with chest pain.  The patient says he has had pain for the past week or so.  The pain is pressure-like aching in his left chest associated with shortness of breath.  Nonradiating.  Somewhat worse with exertion and somewhat improved with rest.  He says he has a longstanding history of coronary artery disease including previous CABG.  He says he was at an outside hospital this last weekend and was told he had "3 blockages in my heart" although only had blood work and an x-ray done and no cardiac catheterization.  He sleeps on one pillow.  Denies change in weight.  Does say that his pain was slightly improved with nitroglycerin.  The pain is not ripping or tearing it does not go straight to his back.  No history of DVT or pulmonary embolism.  He denies active cocaine use although he does have a remote history.    Past Medical History:  Diagnosis Date  . Anxiety disorder   . Arthritis   . CAD (coronary artery disease)    a. ~ 2000 cath at [facility];  b. 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  . CHF (congestive heart failure) (HCC)   . Chronic back pain   . Chronic hip pain   . COPD (chronic obstructive pulmonary disease) (HCC)   . Depression   . Essential hypertension, benign   . GERD (gastroesophageal reflux disease)   . Headache(784.0)   . History of alcohol abuse   . History of cocaine abuse (HCC)   . Hyperlipidemia   . Obesity   . Pneumonia     Patient Active  Problem List   Diagnosis Date Noted  . Hyperthyroidism 07/26/2018  . NSTEMI (non-ST elevated myocardial infarction) (HCC) 07/11/2015  . Chest pain 04/30/2015  . Syncope 04/30/2015  . Orthostatic syncope 04/30/2015  . Chronic back pain   . (HFpEF) heart failure with preserved ejection fraction (HCC)   . History of cocaine abuse (HCC)   . History of alcohol abuse   . Prediabetes 08/12/2014  . Tobacco abuse 09/21/2013  . Sleep apnea- C-pap intol 09/14/2013  . Coronary atherosclerosis of native coronary artery   . OBESITY, MORBID 05/29/2008  . Dyslipidemia 01/19/2007  . ANXIETY DISORDER, GENERALIZED 01/19/2007  . Depression 10/20/2006  . Essential hypertension, benign 10/20/2006  . COPD (chronic obstructive pulmonary disease) (HCC) 10/20/2006    Past Surgical History:  Procedure Laterality Date  . BACK SURGERY    . CHOLECYSTECTOMY    . CORONARY ARTERY BYPASS GRAFT    . HIP SURGERY     rt  . JOINT REPLACEMENT    . LEFT HEART CATHETERIZATION WITH CORONARY ANGIOGRAM N/A 09/12/2013   Procedure: LEFT HEART CATHETERIZATION WITH CORONARY ANGIOGRAM;  Surgeon: Kathleene Hazel, MD;  Location: [facility] CATH LAB;  Service: Cardiovascular;  Laterality: N/A;  . NASAL/SINUS ENDOSCOPY    . TOTAL HIP ARTHROPLASTY  05/08/2012   Procedure: TOTAL HIP ARTHROPLASTY;  Surgeon: Feliberto Gottron  Turner Daniels, MD;  Location: [facility] OR;  Service: Orthopedics;  Laterality: Left;  . TOTAL HIP ARTHROPLASTY  2014 per pt    Prior to Admission medications   Medication Sig Start Date End Date Taking? Authorizing Provider  albuterol (PROVENTIL) (2.5 MG/3ML) 0.083% nebulizer solution Take 2.5 mg by nebulization every 6 (six) hours as needed for wheezing.   Yes [provider]  aspirin 81 MG tablet Take 1 tablet (81 mg total) by mouth daily. 05/31/12  Yes Kathlen Mody, MD  budesonide-formoterol (SYMBICORT) 160-4.5 MCG/ACT inhaler Inhale 2 puffs into the lungs 2 (two) times daily.   Yes [provider]  buPROPion  (WELLBUTRIN XL) 150 MG 24 hr tablet Take 150 mg by mouth daily. 07/15/18  Yes [provider]  diclofenac sodium (VOLTAREN) 1 % GEL Apply 2 g topically 4 (four) times daily. 06/05/18  Yes [provider]  furosemide (LASIX) 20 MG tablet Take 40 mg by mouth every morning.    Yes [provider]  isosorbide mononitrate (IMDUR) 60 MG 24 hr tablet Take 60 mg by mouth daily. 06/22/18  Yes [provider]  methimazole (TAPAZOLE) 10 MG tablet Take 20 mg by mouth daily. 07/21/18  Yes [provider]  Olopatadine HCl (PATADAY) 0.2 % SOLN Apply 2 drops to eye 2 (two) times daily.   Yes [provider]  omeprazole (PRILOSEC) 20 MG capsule Take 20 mg by mouth every morning.    Yes [provider]  oxyCODONE-acetaminophen (PERCOCET) 10-325 MG tablet Take 1 tablet by mouth every 4 (four) hours as needed for pain.   Yes [provider]  predniSONE (DELTASONE) 20 MG tablet Take 40 mg by mouth daily. 04/25/18  Yes [provider]  PROAIR HFA 108 (90 BASE) MCG/ACT inhaler Inhale 2 puffs into the lungs daily as needed for wheezing or shortness of breath.  06/20/13  Yes [provider]  traZODone (DESYREL) 50 MG tablet Take 1-2 tablets by mouth at bedtime. 03/22/18  Yes [provider]  carvedilol (COREG) 3.125 MG tablet Take 1 tablet by mouth 2 (two) times daily. 02/21/18   [provider]  [employer] MELATONIN 3 MG TABS Take 1 tablet by mouth at bedtime as needed. 03/22/18   [provider]  doxepin (SINEQUAN) 25 MG capsule Take 25 mg by mouth at bedtime.  08/06/14   [provider]  nitroGLYCERIN (NITROSTAT) 0.4 MG SL tablet Place 0.4 mg under the tongue every 5 (five) minutes as needed. For chest pain    [provider]  VITAMIN E PO Take 1 tablet by mouth daily.    [provider]    Allergies Ativan [lorazepam]; Haloperidol decanoate; and Tiotropium  Family History  Problem  Relation Age of Onset  . Heart attack Mother   . Heart attack Brother   . Heart attack Sister   . Heart attack Father     Social History Social History   Tobacco Use  . Smoking status: Former Smoker    Packs/day: 0.50    Years: 39.00    Pack years: 19.50    Types: Cigarettes    Last attempt to quit: 09/15/2015    Years since quitting: 2.8  . Smokeless tobacco: Never Used  Substance Use Topics  . Alcohol use: Yes    Alcohol/week: 0.0 standard drinks    Comment: socially; reports drinking 2 beers last nigh  . Drug use: No    Review of Systems Constitutional: No fever/chills Eyes: No visual changes. ENT: No  sore throat. Cardiovascular: Positive for chest pain. Respiratory: Positive for shortness of breath. Gastrointestinal: No abdominal pain.  No nausea, no vomiting.  No diarrhea.  No constipation. Genitourinary: Negative for dysuria. Musculoskeletal: Negative for back pain. Skin: Negative for rash. Neurological: Negative for headaches, focal weakness or numbness.   ____________________________________________   PHYSICAL EXAM:  VITAL SIGNS: ED Triage Vitals  Enc Vitals Group     BP 07/25/18 2222 131/65     Pulse Rate 07/25/18 2222 88     Resp 07/25/18 2222 18     Temp 07/25/18 2222 97.8 F (36.6 C)     Temp Source 07/25/18 2222 Oral     SpO2 07/25/18 2222 97 %     Weight 07/25/18 2219 (!) 354 lb (160.6 kg)     Height 07/25/18 2219 6\' 3"  (1.905 m)     Head Circumference --      Peak Flow --      Pain Score 07/25/18 2219 7     Pain Loc --      Pain Edu? --      Excl. in GC? --     Constitutional: Alert and oriented x4 appears somewhat uncomfortable although nontoxic no diaphoresis and speaks in full clear sentences Eyes: PERRL EOMI. Head: Atraumatic. Nose: No congestion/rhinnorhea. Mouth/Throat: No trismus Neck: No stridor.  Able to lie completely flat although with some JVD Cardiovascular: Normal rate, regular rhythm. Grossly normal heart sounds.  Good  peripheral circulation. Respiratory: Slightly increased respiratory effort with crackles at bilateral bases Gastrointestinal: Obese soft nontender Musculoskeletal: Legs are equal in size Neurologic:  Normal speech and language. No gross focal neurologic deficits are appreciated. Skin: Anterior chest wall with well-healed previous scar Psychiatric: Mood and affect are normal. Speech and behavior are normal.    ____________________________________________   DIFFERENTIAL includes but not limited to  Acute coronary syndrome, pulmonary embolism, CHF exacerbation, vasospasm, pneumonia, pneumothorax ____________________________________________   LABS (all labs ordered are listed, but only abnormal results are displayed)  Labs Reviewed  COMPREHENSIVE METABOLIC PANEL - Abnormal; Notable for the following components:      Result Value   Glucose, Bld 148 (*)    All other components within normal limits  CBC WITH DIFFERENTIAL/PLATELET - Abnormal; Notable for the following components:   Monocytes Absolute 1.2 (*)    All other components within normal limits  BRAIN NATRIURETIC PEPTIDE  TROPONIN I  URINE DRUG SCREEN, QUALITATIVE ([facility] ONLY)  TROPONIN I    Lab work reviewed by me with a first troponin which is negative __________________________________________  EKG  ED ECG REPORT I, Merrily Brittle, the attending physician, personally viewed and interpreted this ECG.  Date: 07/26/2018 EKG Time:  Rate: 88 Rhythm: normal sinus rhythm QRS Axis: Leftward axis Intervals: normal ST/T Wave abnormalities: normal Narrative Interpretation: no evidence of acute ischemia  ____________________________________________  RADIOLOGY  Chest x-ray reviewed by me with evidence of fluid overload ____________________________________________   PROCEDURES  Procedure(s) performed: no  Procedures  Critical Care performed: no  ____________________________________________   INITIAL  IMPRESSION / ASSESSMENT AND PLAN / ED COURSE  Pertinent labs & imaging results that were available during my care of the patient were reviewed by me and considered in my medical decision making (see chart for details).   As part of my medical decision making, I reviewed the following data within the electronic MEDICAL RECORD NUMBER History obtained from family if available, nursing notes, old chart and ekg, as well as notes from prior ED visits.  Patient  comes to the emergency department somewhat uncomfortable appearing with left-sided exertional chest pain.  I gave an additional dose of nitroglycerin here with only minimal improvement of his symptoms and his blood pressure went transiently low to about 89 systolic.  Fortunately the patient's first troponin is negative however he does have a long cardiac history raising concern for possible stuttering ischemia.  He is able to lie flat although his chest x-ray has significant pulmonary edema compared to previous consistent with fluid overload.  At this point given the patient's known coronary artery disease and ongoing exertional chest pain I do believe he requires inpatient admission for full cardiac risk ratification.  I will give a first dose of IV Lasix 40 mg now along with IV enalapril for the fluid overload.  The patient verbalizes understanding agreement with the plan.  Given his ongoing pain despite nitrates I will give 4 mg of IV morphine.  I then discussed with the hospitalist who has graciously agreed to move the patient to his service.  Following morphine the patient's pain is significantly improved.  Pending admission at this time.  He is diuresing nicely.      ____________________________________________   FINAL CLINICAL IMPRESSION(S) / ED DIAGNOSES  Final diagnoses:  Chest pain, unspecified type  Acute pulmonary edema (HCC)      NEW MEDICATIONS STARTED DURING THIS VISIT:  New Prescriptions   No medications on file     Note:   This document was prepared using Dragon voice recognition software and may include unintentional dictation errors.     Merrily Brittle, MD 07/26/18 [id number]

## 2018-07-25 NOTE — ED Triage Notes (Signed)
Pt arrived via [facility] from home with c/o Chest pain. EMS states that pt has hx of CABG x3. EMS states that pt was at [facility] Sunday and discharged and told he has 3 blockages. EMS states pt pain 7/10 and has had 324 mg aspirin, 1 nitro spray, and 2 sublingual nitro.

## 2018-07-26 ENCOUNTER — Observation Stay
Admit: 2018-07-26 | Discharge: 2018-07-26 | Disposition: A | Discharge status: Home / Self Care | Payer: Medicare Other | Attending: Internal Medicine | Admitting: Internal Medicine

## 2018-07-26 ENCOUNTER — Encounter: Payer: Self-pay | Admitting: *Deleted

## 2018-07-26 DIAGNOSIS — R079 Chest pain, unspecified: Secondary | ICD-10-CM

## 2018-07-26 DIAGNOSIS — R0789 Other chest pain: Secondary | ICD-10-CM | POA: Diagnosis not present

## 2018-07-26 DIAGNOSIS — E0590 Thyrotoxicosis, unspecified without thyrotoxic crisis or storm: Secondary | ICD-10-CM | POA: Diagnosis present

## 2018-07-26 LAB — BASIC METABOLIC PANEL
Anion gap: 8 (ref 5–15)
BUN: 13 mg/dL (ref 6–20)
CO2: 23 mmol/L (ref 22–32)
Calcium: 8.8 mg/dL — ABNORMAL LOW (ref 8.9–10.3)
Chloride: 107 mmol/L (ref 98–111)
Creatinine, Ser: 1.18 mg/dL (ref 0.61–1.24)
GFR calc Af Amer: >60 mL/min (ref >60)
GFR calc non Af Amer: >60 mL/min (ref >60)
Glucose, Bld: 123 mg/dL — ABNORMAL HIGH (ref 70–99)
POTASSIUM: 3.3 mmol/L — AB (ref 3.5–5.1)
Sodium: 138 mmol/L (ref 135–145)

## 2018-07-26 LAB — CBC
HCT: 39.8 % (ref 39.0–52.0)
HEMOGLOBIN: 13.5 g/dL (ref 13.0–17.0)
MCH: 32.3 pg (ref 26.0–34.0)
MCHC: 33.9 g/dL (ref 30.0–36.0)
MCV: 95.2 fL (ref 80.0–100.0)
Platelets: 220 10*3/uL (ref 150–400)
RBC: 4.18 MIL/uL — ABNORMAL LOW (ref 4.22–5.81)
RDW: 12.5 % (ref 11.5–15.5)
WBC: 9.6 10*3/uL (ref 4.0–10.5)
nRBC: 0 % (ref 0.0–0.2)

## 2018-07-26 LAB — URINE DRUG SCREEN, QUALITATIVE (ARMC ONLY)
Amphetamines, Ur Screen: NOT DETECTED
Barbiturates, Ur Screen: NOT DETECTED
Benzodiazepine, Ur Scrn: POSITIVE — AB
CANNABINOID 50 NG, UR ~~LOC~~: NOT DETECTED
Cocaine Metabolite,Ur ~~LOC~~: NOT DETECTED
MDMA (ECSTASY) UR SCREEN: NOT DETECTED
Methadone Scn, Ur: NOT DETECTED
OPIATE, UR SCREEN: POSITIVE — AB
PHENCYCLIDINE (PCP) UR S: NOT DETECTED
Tricyclic, Ur Screen: POSITIVE — AB

## 2018-07-26 LAB — TROPONIN I
Troponin I: <0.03 ng/mL (ref <0.03)
Troponin I: <0.03 ng/mL (ref <0.03)

## 2018-07-26 LAB — ECHOCARDIOGRAM COMPLETE
Height: 75 in
Weight: 5587.34 oz

## 2018-07-26 LAB — MAGNESIUM: MAGNESIUM: 2.1 mg/dL (ref 1.7–2.4)

## 2018-07-26 MED ORDER — PERFLUTREN LIPID MICROSPHERE
1.0000 mL | INTRAVENOUS | Status: AC | PRN
  Administered 2018-07-26: 2 mL via INTRAVENOUS
  Filled 2018-07-26: qty 10

## 2018-07-26 MED ORDER — BUPROPION HCL ER (XL) 150 MG PO TB24
150.0000 mg | ORAL_TABLET | Freq: Every day | ORAL | Status: DC
  Administered 2018-07-26: 150 mg via ORAL
  Filled 2018-07-26: qty 1

## 2018-07-26 MED ORDER — ASPIRIN EC 81 MG PO TBEC
81.0000 mg | DELAYED_RELEASE_TABLET | Freq: Every day | ORAL | Status: DC
  Administered 2018-07-26: 81 mg via ORAL
  Filled 2018-07-26: qty 1

## 2018-07-26 MED ORDER — ACETAMINOPHEN 650 MG RE SUPP: 650.0000 mg | Freq: Four times a day (QID) | RECTAL | Status: DC | PRN

## 2018-07-26 MED ORDER — ACETAMINOPHEN 325 MG PO TABS: 650.0000 mg | ORAL_TABLET | Freq: Four times a day (QID) | ORAL | Status: DC | PRN

## 2018-07-26 MED ORDER — MELATONIN 5 MG PO TABS
5.0000 mg | ORAL_TABLET | Freq: Every evening | ORAL | Status: DC | PRN
  Filled 2018-07-26: qty 1

## 2018-07-26 MED ORDER — TIOTROPIUM BROMIDE MONOHYDRATE 18 MCG IN CAPS
18.0000 ug | ORAL_CAPSULE | Freq: Every day | RESPIRATORY_TRACT | Status: DC
  Filled 2018-07-26 (×2): qty 5

## 2018-07-26 MED ORDER — TRAZODONE HCL 50 MG PO TABS: 50.0000 mg | ORAL_TABLET | Freq: Every day | ORAL | Status: DC

## 2018-07-26 MED ORDER — MOMETASONE FURO-FORMOTEROL FUM 200-5 MCG/ACT IN AERO
2.0000 | INHALATION_SPRAY | Freq: Two times a day (BID) | RESPIRATORY_TRACT | Status: DC
  Administered 2018-07-26: 2 via RESPIRATORY_TRACT
  Filled 2018-07-26: qty 8.8

## 2018-07-26 MED ORDER — OXYCODONE HCL 5 MG PO TABS: 5.0000 mg | ORAL_TABLET | ORAL | Status: DC | PRN

## 2018-07-26 MED ORDER — ATORVASTATIN CALCIUM 20 MG PO TABS: 40.0000 mg | ORAL_TABLET | Freq: Every day | ORAL | Status: DC

## 2018-07-26 MED ORDER — METHIMAZOLE 10 MG PO TABS
20.0000 mg | ORAL_TABLET | Freq: Every day | ORAL | Status: DC
  Administered 2018-07-26: 20 mg via ORAL
  Filled 2018-07-26: qty 2

## 2018-07-26 MED ORDER — POTASSIUM CHLORIDE CRYS ER 20 MEQ PO TBCR
40.0000 meq | EXTENDED_RELEASE_TABLET | Freq: Once | ORAL | Status: AC
  Administered 2018-07-26: 40 meq via ORAL
  Filled 2018-07-26: qty 2

## 2018-07-26 MED ORDER — ONDANSETRON HCL 4 MG PO TABS: 4.0000 mg | ORAL_TABLET | Freq: Four times a day (QID) | ORAL | Status: DC | PRN

## 2018-07-26 MED ORDER — FUROSEMIDE 40 MG PO TABS
40.0000 mg | ORAL_TABLET | Freq: Every morning | ORAL | Status: DC
  Administered 2018-07-26: 40 mg via ORAL
  Filled 2018-07-26: qty 1

## 2018-07-26 MED ORDER — ENOXAPARIN SODIUM 40 MG/0.4ML ~~LOC~~ SOLN
40.0000 mg | SUBCUTANEOUS | Status: DC
  Administered 2018-07-26: 40 mg via SUBCUTANEOUS
  Filled 2018-07-26 (×2): qty 0.4

## 2018-07-26 MED ORDER — ONDANSETRON HCL 4 MG/2ML IJ SOLN: 4.0000 mg | Freq: Four times a day (QID) | INTRAMUSCULAR | Status: DC | PRN

## 2018-07-26 MED ORDER — ENOXAPARIN SODIUM 40 MG/0.4ML ~~LOC~~ SOLN
40.0000 mg | Freq: Two times a day (BID) | SUBCUTANEOUS | Status: DC
  Administered 2018-07-26: 40 mg via SUBCUTANEOUS
  Filled 2018-07-26: qty 0.4

## 2018-07-26 MED ORDER — PANTOPRAZOLE SODIUM 40 MG PO TBEC
40.0000 mg | DELAYED_RELEASE_TABLET | Freq: Every day | ORAL | Status: DC
  Administered 2018-07-26: 40 mg via ORAL
  Filled 2018-07-26: qty 1

## 2018-07-26 MED ORDER — OXYCODONE-ACETAMINOPHEN 10-325 MG PO TABS: 1.0000 | ORAL_TABLET | ORAL | Status: DC | PRN

## 2018-07-26 MED ORDER — OXYCODONE-ACETAMINOPHEN 5-325 MG PO TABS
1.0000 | ORAL_TABLET | ORAL | Status: DC | PRN
  Filled 2018-07-26: qty 1

## 2018-07-26 MED ORDER — TRAZODONE HCL 50 MG PO TABS: 50.0000 mg | ORAL_TABLET | Freq: Every evening | ORAL | Status: DC | PRN

## 2018-07-26 MED ORDER — MORPHINE SULFATE (PF) 2 MG/ML IV SOLN
2.0000 mg | INTRAVENOUS | Status: DC | PRN
  Administered 2018-07-26 (×3): 2 mg via INTRAVENOUS
  Filled 2018-07-26 (×3): qty 1

## 2018-07-26 NOTE — Consult Note (Signed)
[facility] Cardiology Consultation Note  Patient ID: [name] Graybeal., MRN: [id number], DOB/AGE: [birth year] 62 y.o. Admit date: 07/25/2018   Date of Consult: 07/26/2018 Primary Physician: Bubba Camp, MD Primary Cardiologist: None  Chief Complaint:  Chief Complaint  Patient presents with  . Chest Pain   Reason for Consult: Chest pain  HPI: 62 y.o. male with known coronary artery disease status post coronary bypass graft in 2002 status post previous PCI and stent placement as well with apparent normal LV systolic function having significant complications of postoperative infection and scarring.  He is on appropriate medication management for risk factor modification including hypertension and hyperlipidemia.  The patient has had new onset of weakness fatigue and left arm left chest pressure which is waxing and waning at times but mainly constant for the last several days.  The patient arrives in the emergency room and is relieved by significant medication management including narcotics.  The patient has had an EKG showing normal sinus rhythm and a normal troponin without evidence of myocardial infarction.  Chest x-ray shows no evidence of congestive heart failure at this time.  Previous CT scan showed no evidence of pulmonary embolism earlier this year and other than scarring no other changes in the lung fields.  Currently seen you hemodynamically stable and at this time  Past Medical History:  Diagnosis Date  . Anxiety disorder   . Arthritis   . CAD (coronary artery disease)    a. ~ 2000 cath at [facility];  b. 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  . CHF (congestive heart failure) (HCC)   . Chronic back pain   . Chronic hip pain   . COPD (chronic obstructive pulmonary disease) (HCC)   . Depression   . Essential hypertension, benign   .  GERD (gastroesophageal reflux disease)   . Headache(784.0)   . History of alcohol abuse   . History of cocaine abuse (HCC)   . Hyperlipidemia   . Obesity   . Pneumonia       Surgical History:  Past Surgical History:  Procedure Laterality Date  . BACK SURGERY    . CHOLECYSTECTOMY    . CORONARY ARTERY BYPASS GRAFT    . HIP SURGERY     rt  . JOINT REPLACEMENT    . LEFT HEART CATHETERIZATION WITH CORONARY ANGIOGRAM N/A 09/12/2013   Procedure: LEFT HEART CATHETERIZATION WITH CORONARY ANGIOGRAM;  Surgeon: Kathleene Hazel, MD;  Location: [facility] CATH LAB;  Service: Cardiovascular;  Laterality: N/A;  . NASAL/SINUS ENDOSCOPY    . TOTAL HIP ARTHROPLASTY  05/08/2012   Procedure: TOTAL HIP ARTHROPLASTY;  Surgeon: Nestor Lewandowsky, MD;  Location: [facility] OR;  Service: Orthopedics;  Laterality: Left;  . TOTAL HIP ARTHROPLASTY  2014 per pt     Home Meds: Prior to Admission medications   Medication Sig Start Date End Date Taking? Authorizing Provider  albuterol (PROVENTIL) (2.5 MG/3ML) 0.083% nebulizer solution Take 2.5 mg by nebulization every 6 (six) hours as needed for wheezing.   Yes [provider]  aspirin 81 MG tablet Take 1 tablet (81 mg total) by mouth daily. 05/31/12  Yes Kathlen Mody, MD  budesonide-formoterol (SYMBICORT) 160-4.5 MCG/ACT inhaler Inhale 2 puffs into the lungs 2 (two) times daily.   Yes [provider]  buPROPion (WELLBUTRIN XL) 150 MG 24  hr tablet Take 150 mg by mouth daily. 07/15/18  Yes [provider]  diclofenac sodium (VOLTAREN) 1 % GEL Apply 2 g topically 4 (four) times daily. 06/05/18  Yes [provider]  furosemide (LASIX) 20 MG tablet Take 40 mg by mouth every morning.    Yes [provider]  isosorbide mononitrate (IMDUR) 60 MG 24 hr tablet Take 60 mg by mouth daily. 06/22/18  Yes [provider]  methimazole (TAPAZOLE) 10 MG tablet Take 20 mg by mouth daily. 07/21/18  Yes [provider]  Olopatadine  HCl (PATADAY) 0.2 % SOLN Apply 2 drops to eye 2 (two) times daily.   Yes [provider]  omeprazole (PRILOSEC) 20 MG capsule Take 20 mg by mouth every morning.    Yes [provider]  oxyCODONE-acetaminophen (PERCOCET) 10-325 MG tablet Take 1 tablet by mouth every 4 (four) hours as needed for pain.   Yes [provider]  predniSONE (DELTASONE) 20 MG tablet Take 40 mg by mouth daily. 04/25/18  Yes [provider]  PROAIR HFA 108 (90 BASE) MCG/ACT inhaler Inhale 2 puffs into the lungs daily as needed for wheezing or shortness of breath.  06/20/13  Yes [provider]  traZODone (DESYREL) 50 MG tablet Take 1-2 tablets by mouth at bedtime. 03/22/18  Yes [provider]  carvedilol (COREG) 3.125 MG tablet Take 1 tablet by mouth 2 (two) times daily. 02/21/18   [provider]  [employer] MELATONIN 3 MG TABS Take 1 tablet by mouth at bedtime as needed. 03/22/18   [provider]  doxepin (SINEQUAN) 25 MG capsule Take 25 mg by mouth at bedtime.  08/06/14   [provider]  nitroGLYCERIN (NITROSTAT) 0.4 MG SL tablet Place 0.4 mg under the tongue every 5 (five) minutes as needed. For chest pain    [provider]  VITAMIN E PO Take 1 tablet by mouth daily.    [provider]    Inpatient Medications:  . aspirin EC  81 mg Oral Daily  . atorvastatin  40 mg Oral q1800  . buPROPion  150 mg Oral Daily  . enoxaparin (LOVENOX) injection  40 mg Subcutaneous Q12H  . furosemide  40 mg Oral q morning - 10a  . methimazole  20 mg Oral Daily  . mometasone-formoterol  2 puff Inhalation BID  . pantoprazole  40 mg Oral Daily  . tiotropium  18 mcg Inhalation Daily  . traZODone  50-100 mg Oral QHS     Allergies:  Allergies  Allergen Reactions  . Ativan [Lorazepam] Other (See Comments)    "out of mind"  . Haloperidol Decanoate   . Tiotropium Swelling    Per patient his tongue swells up     Social History   Socioeconomic  History  . Marital status: Widowed    Spouse name: Not on file  . Number of children: Not on file  . Years of education: Not on file  . Highest education level: Not on file  Occupational History  . Occupation: UNEMPLOYED  Social Needs  . Financial resource strain: Not hard at all  . Food insecurity:    Worry: Never true    Inability: Never true  . Transportation needs:    Medical: No    Non-medical: No  Tobacco Use  . Smoking status: Former Smoker    Packs/day: 0.50    Years: 39.00    Pack years: 19.50    Types: Cigarettes    Last attempt to  quit: 09/15/2015    Years since quitting: 2.8  . Smokeless tobacco: Never Used  Substance and Sexual Activity  . Alcohol use: Yes    Alcohol/week: 0.0 standard drinks    Comment: socially; reports drinking 2 beers last nigh  . Drug use: No  . Sexual activity: Not on file  Lifestyle  . Physical activity:    Days per week: Patient refused    Minutes per session: Patient refused  . Stress: Not at all  Relationships  . Social connections:    Talks on phone: Patient refused    Gets together: Patient refused    Attends religious service: Patient refused    Active member of club or organization: Patient refused    Attends meetings of clubs or organizations: Patient refused    Relationship status: Patient refused  . Intimate partner violence:    Fear of current or ex partner: Patient refused    Emotionally abused: Patient refused    Physically abused: Patient refused    Forced sexual activity: Patient refused  Other Topics Concern  . Not on file  Social History Narrative  . Not on file     Family History  Problem Relation Age of Onset  . Heart attack Mother   . Heart attack Brother   . Heart attack Sister   . Heart attack Father      Review of Systems Positive for chest pain Negative for: General:  chills, fever, night sweats or weight changes.  Cardiovascular: PND orthopnea syncope dizziness  Dermatological skin lesions  rashes Respiratory: Cough congestion Urologic: Frequent urination urination at night and hematuria Abdominal: negative for nausea, vomiting, diarrhea, bright red blood per rectum, melena, or hematemesis Neurologic: negative for visual changes, and/or hearing changes  All other systems reviewed and are otherwise negative except as noted above.  Labs: Recent Labs    07/25/18 2226 07/26/18 0316 07/26/18 1028  TROPONINI <0.03 <0.03 <0.03   Lab Results  Component Value Date   WBC 9.6 07/26/2018   HGB 13.5 07/26/2018   HCT 39.8 07/26/2018   MCV 95.2 07/26/2018   PLT 220 07/26/2018    Recent Labs  Lab 07/25/18 2226 07/26/18 0557  NA 136 138  K 3.6 3.3*  CL 105 107  CO2 22 23  BUN 12 13  CREATININE 1.21 1.18  CALCIUM 9.1 8.8*  PROT 7.8  --   BILITOT 0.5  --   ALKPHOS 76  --   ALT 15  --   AST 23  --   GLUCOSE 148* 123*   Lab Results  Component Value Date   CHOL 200 04/22/2018   HDL 47 04/22/2018   LDLCALC 126 (H) 04/22/2018   TRIG 136 04/22/2018   Lab Results  Component Value Date   DDIMER <0.27 10/19/2014    Radiology/Studies:  Dg Chest Port 1 View  Result Date: 07/25/2018 CLINICAL DATA:  Chest pain EXAM: PORTABLE CHEST 1 VIEW COMPARISON:  07/21/2018 FINDINGS: Cardiomegaly with vascular congestion and interstitial prominence, likely interstitial edema. No effusions or acute bony abnormality. IMPRESSION: Cardiomegaly, mild CHF. Electronically Signed   By: Charlett Nose M.D.   On: 07/25/2018 23:24    EKG: Normal sinus rhythm  Weights: Filed Weights   07/25/18 2219 07/26/18 0620  Weight: (!) 160.6 kg (!) 158.4 kg     Physical Exam: Blood pressure 98/61, pulse 72, temperature (!) 97.5 F (36.4 C), temperature source Oral, resp. rate (!) 22, height 6\' 3"  (1.905 m), weight (!) 158.4 kg, SpO2  97 %. Body mass index is 43.65 kg/m. General: Well developed, well nourished, in no acute distress. Head eyes ears nose throat: Normocephalic, atraumatic, sclera  non-icteric, no xanthomas, nares are without discharge. No apparent thyromegaly and/or mass  Lungs: Normal respiratory effort.  no wheezes, no rales, no rhonchi.  Heart: RRR with normal S1 S2. no murmur gallop, no rub, PMI is normal size and placement, carotid upstroke normal without bruit, jugular venous pressure is normal Abdomen: Soft, non-tender, non-distended with normoactive bowel sounds. No hepatomegaly. No rebound/guarding. No obvious abdominal masses. Abdominal aorta is normal size without bruit Extremities: No edema. no cyanosis, no clubbing, no ulcers  Peripheral : 2+ bilateral upper extremity pulses, 2+ bilateral femoral pulses, 2+ bilateral dorsal pedal pulse Neuro: Alert and oriented. No facial asymmetry. No focal deficit. Moves all extremities spontaneously. Musculoskeletal: Normal muscle tone without kyphosis Psych:  Responds to questions appropriately with a normal affect.    Assessment: 62 year old male with known coronary disease status post coronary bypass graft essential hypertension mixed hyperlipidemia having constant left-sided chest pain without evidence of heart failure or myocardial infarction  Plan: 1.  Further relief of chest discomfort possibly musculoskeletal or pulmonary or inflammatory in nature currently not suggestive of cardiovascular concerns 2.  Begin ambulation and follow for worsening his chest discomfort and further evaluation 3.  Continue antihypertensive medication management risk management including lipid management as able 4.  Consideration of stress test and/or echocardiogram if necessary for further evaluation if patient continues to have difficulty  Signed, Lamar Blinks M.D. [facility] Cardiology 07/26/2018, 1:13 PM

## 2018-07-26 NOTE — Progress Notes (Signed)
Notified Dr. Sheryle Hail about there being no telemetry boxes available at this time.  Also spoke with the [facility], Helmut Muster about not having a telemetry box for the pt.

## 2018-07-26 NOTE — H&P (Addendum)
[facility] - [place] at [facility]   PATIENT NAME: [name]    MR#:  [id number]  DATE OF BIRTH:  07-28-1956  DATE OF ADMISSION:  07/25/2018  PRIMARY CARE PHYSICIAN: Bubba Camp, MD   REQUESTING/REFERRING PHYSICIAN: Lamont Snowball, MD  CHIEF COMPLAINT:   Chief Complaint  Patient presents with  . Chest Pain    HISTORY OF PRESENT ILLNESS:  [name]  is a 62 y.o. male who presents with chief complaint as above.  Patient presents to the ED with increased chest pain.  He has known significant cardiac history with multiple bypass surgeries and prior stenting.  Over the last week or so he said increasing chest pain, which got significantly worse over the last 2 days.  He came to the ED for evaluation.  Initial work-up here shows normal troponin, but he has persistent discomfort despite being somewhat improved.  Hospitalist were called for admission and further evaluation  PAST MEDICAL HISTORY:   Past Medical History:  Diagnosis Date  . Anxiety disorder   . Arthritis   . CAD (coronary artery disease)    a. ~ 2000 cath at [facility];  b. 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  . CHF (congestive heart failure) (HCC)   . Chronic back pain   . Chronic hip pain   . COPD (chronic obstructive pulmonary disease) (HCC)   . Depression   . Essential hypertension, benign   . GERD (gastroesophageal reflux disease)   . Headache(784.0)   . History of alcohol abuse   . History of cocaine abuse (HCC)   . Hyperlipidemia   . Obesity   . Pneumonia      PAST SURGICAL HISTORY:   Past Surgical History:  Procedure Laterality Date  . BACK SURGERY    . CHOLECYSTECTOMY    . CORONARY ARTERY BYPASS GRAFT    . HIP SURGERY     rt  . JOINT REPLACEMENT    . LEFT HEART CATHETERIZATION WITH CORONARY ANGIOGRAM N/A 09/12/2013   Procedure: LEFT HEART CATHETERIZATION WITH CORONARY ANGIOGRAM;  Surgeon: Kathleene Hazel, MD;  Location: [facility] CATH LAB;  Service: Cardiovascular;  Laterality: N/A;  . NASAL/SINUS ENDOSCOPY    . TOTAL HIP ARTHROPLASTY  05/08/2012   Procedure: TOTAL HIP ARTHROPLASTY;  Surgeon: Nestor Lewandowsky, MD;  Location: [facility] OR;  Service: Orthopedics;  Laterality: Left;  . TOTAL HIP ARTHROPLASTY  2014 per pt     SOCIAL HISTORY:   Social History   Tobacco Use  . Smoking status: Former Smoker    Packs/day: 0.50    Years: 39.00    Pack years: 19.50    Types: Cigarettes    Last attempt to quit: 09/15/2015    Years since quitting: 2.8  . Smokeless tobacco: Never Used  Substance Use Topics  . Alcohol use: Yes    Alcohol/week: 0.0 standard drinks    Comment: socially; reports drinking 2 beers last nigh     FAMILY HISTORY:   Family History  Problem Relation Age of Onset  . Heart attack Mother   . Heart attack Brother   . Heart attack Sister   . Heart attack Father      DRUG ALLERGIES:   Allergies  Allergen Reactions  . Ativan [Lorazepam] Other (See Comments)    "out of mind"  . Haloperidol  Decanoate   . Tiotropium Swelling    Per patient his tongue swells up     MEDICATIONS AT HOME:   Prior to Admission medications   Medication Sig Start Date End Date Taking? Authorizing Provider  aspirin 81 MG tablet Take 1 tablet (81 mg total) by mouth daily. 05/31/12   Kathlen Mody, MD  atorvastatin (LIPITOR) 10 MG tablet Take 1 tablet (10 mg total) by mouth daily. 04/23/18 05/23/18  Ihor Austin, MD  budesonide-formoterol (SYMBICORT) 160-4.5 MCG/ACT inhaler Inhale 2 puffs into the lungs 2 (two) times daily.    [provider]  buPROPion (WELLBUTRIN XL) 150 MG 24 hr tablet Take 1 tablet (150 mg total) by mouth daily for 15 days. 04/23/18 05/08/18  Ihor Austin, MD  buPROPion (WELLBUTRIN XL) 150 MG 24 hr tablet Take 150 mg by mouth daily. 07/15/18   [provider]  carvedilol  (COREG) 3.125 MG tablet Take 1 tablet by mouth 2 (two) times daily. 02/21/18   [provider]  citalopram (CELEXA) 10 MG tablet Take 10 mg by mouth every morning.    [provider]  [employer]S MELATONIN 3 MG TABS Take 1 tablet by mouth at bedtime as needed. 03/22/18   [provider]  doxepin (SINEQUAN) 25 MG capsule Take 25 mg by mouth at bedtime.  08/06/14   [provider]  furosemide (LASIX) 20 MG tablet Take 40 mg by mouth every morning.     [provider]  gabapentin (NEURONTIN) 300 MG capsule Take 1 capsule (300 mg total) by mouth 3 (three) times daily. 04/23/18 05/23/18  Ihor Austin, MD  ipratropium-albuterol (DUONEB) 0.5-2.5 (3) MG/3ML SOLN Take 3 mLs by nebulization every 6 (six) hours as needed (shortness of breath/ wheezing.).     [provider]  isosorbide mononitrate (IMDUR) 30 MG 24 hr tablet Take 1 tablet (30 mg total) by mouth 2 (two) times daily. Patient taking differently: Take 60 mg by mouth daily.  08/12/14   Dhungel, Theda Belfast, MD  methimazole (TAPAZOLE) 10 MG tablet Take 20 mg by mouth daily. 07/21/18   [provider]  metoprolol tartrate (LOPRESSOR) 25 MG tablet Take 1 tablet (25 mg total) by mouth daily. 04/24/18 05/24/18  Ihor Austin, MD  nitroGLYCERIN (NITROSTAT) 0.4 MG SL tablet Place 0.4 mg under the tongue every 5 (five) minutes as needed. For chest pain    [provider]  omeprazole (PRILOSEC) 20 MG capsule Take 20 mg by mouth every morning.     [provider]  oxyCODONE-acetaminophen (PERCOCET) 10-325 MG tablet Take 1 tablet by mouth every 4 (four) hours as needed for pain.    [provider]  potassium chloride SA (K-DUR,KLOR-CON) 20 MEQ tablet Take 20 mEq by mouth every morning.  08/13/13   [provider]  predniSONE (DELTASONE) 10 MG tablet Take 1 tablet (10 mg total) by mouth daily. Label  & dispense according to the schedule below.  6 tablets day one, then 5 table day 2,  then 4 tablets day 3, then 3 tablets day 4, 2 tablets day 5, then 1 tablet day 6, then stop Patient not taking: Reported on 07/26/2018 04/23/18   Ihor Austin, MD  predniSONE (DELTASONE) 20 MG tablet Take 40 mg by mouth daily. 04/25/18   [provider]  PROAIR HFA 108 (90 BASE) MCG/ACT inhaler Inhale 2 puffs into the lungs daily as needed for wheezing or shortness of breath.  06/20/13   [provider]  tiotropium (SPIRIVA) 18 MCG inhalation capsule  Place 18 mcg into inhaler and inhale daily.    [provider]  traZODone (DESYREL) 50 MG tablet Take 1-2 tablets by mouth at bedtime. 03/22/18   [provider]  VITAMIN E PO Take 1 tablet by mouth daily.    [provider]    REVIEW OF SYSTEMS:  Review of Systems  Constitutional: Negative for chills, fever, malaise/fatigue and weight loss.  HENT: Negative for ear pain, hearing loss and tinnitus.   Eyes: Negative for blurred vision, double vision, pain and redness.  Respiratory: Negative for cough, hemoptysis and shortness of breath.   Cardiovascular: Positive for chest pain. Negative for palpitations, orthopnea and leg swelling.  Gastrointestinal: Negative for abdominal pain, constipation, diarrhea, nausea and vomiting.  Genitourinary: Negative for dysuria, frequency and hematuria.  Musculoskeletal: Negative for back pain, joint pain and neck pain.  Skin:       No acne, rash, or lesions  Neurological: Negative for dizziness, tremors, focal weakness and weakness.  Endo/Heme/Allergies: Negative for polydipsia. Does not bruise/bleed easily.  Psychiatric/Behavioral: Negative for depression. The patient is not nervous/anxious and does not have insomnia.      VITAL SIGNS:   Vitals:   07/25/18 2222 07/25/18 2230 07/25/18 2245 07/25/18 2300  BP: 131/65 131/65  140/73  Pulse: 88 85    Resp: 18 20 (!) 22 19  Temp: 97.8 F (36.6 C)     TempSrc: Oral     SpO2: 97% 97%    Weight:      Height:       Wt  Readings from Last 3 Encounters:  07/25/18 (!) 160.6 kg  05/10/18 (!) 163.3 kg  05/03/18 (!) 163.3 kg    PHYSICAL EXAMINATION:  Physical Exam  Vitals reviewed. Constitutional: He is oriented to person, place, and time. He appears well-developed and well-nourished. No distress.  HENT:  Head: Normocephalic and atraumatic.  Mouth/Throat: Oropharynx is clear and moist.  Eyes: Pupils are equal, round, and reactive to light. Conjunctivae and EOM are normal. No scleral icterus.  Neck: Normal range of motion. Neck supple. No JVD present. No thyromegaly present.  Cardiovascular: Normal rate, regular rhythm and intact distal pulses. Exam reveals no gallop and no friction rub.  No murmur heard. Respiratory: Effort normal and breath sounds normal. No respiratory distress. He has no wheezes. He has no rales.  Chest wall scarring from prior surgeries  GI: Soft. Bowel sounds are normal. He exhibits no distension. There is no tenderness.  Musculoskeletal: Normal range of motion. He exhibits no edema.  No arthritis, no gout  Lymphadenopathy:    He has no cervical adenopathy.  Neurological: He is alert and oriented to person, place, and time. No cranial nerve deficit.  No dysarthria, no aphasia  Skin: Skin is warm and dry. No rash noted. No erythema.  Psychiatric: He has a normal mood and affect. His behavior is normal. Judgment and thought content normal.    LABORATORY PANEL:   CBC Recent Labs  Lab 07/25/18 2226  WBC 10.0  HGB 13.7  HCT 40.9  PLT 245   ------------------------------------------------------------------------------------------------------------------  Chemistries  Recent Labs  Lab 07/25/18 2226  NA 136  K 3.6  CL 105  CO2 22  GLUCOSE 148*  BUN 12  CREATININE 1.21  CALCIUM 9.1  AST 23  ALT 15  ALKPHOS 76  BILITOT 0.5   ------------------------------------------------------------------------------------------------------------------  Cardiac Enzymes Recent  Labs  Lab 07/25/18 2226  TROPONINI <0.03   ------------------------------------------------------------------------------------------------------------------  RADIOLOGY:  Dg Chest [facility]  Result Date: 07/25/2018 CLINICAL DATA:  Chest pain EXAM: PORTABLE CHEST 1 VIEW COMPARISON:  07/21/2018 FINDINGS: Cardiomegaly with vascular congestion and interstitial prominence, likely interstitial edema. No effusions or acute bony abnormality. IMPRESSION: Cardiomegaly, mild CHF. Electronically Signed   By: Charlett Nose M.D.   On: 07/25/2018 23:24    EKG:   Orders placed or performed during the hospital encounter of 07/25/18  . EKG 12-Lead  . EKG 12-Lead  . ED EKG  . ED EKG    IMPRESSION AND PLAN:  Principal Problem:   Chest pain -initial troponin negative, cycle cardiac enzymes, get an echocardiogram and a cardiology consult Active Problems:   Essential hypertension, benign -continue home dose antihypertensives   COPD (chronic obstructive pulmonary disease) (HCC) -continue home dose inhalers   Coronary atherosclerosis of native coronary artery -continue home medications, other work-up as above   (HFpEF) heart failure with preserved ejection fraction (HCC) -continue home meds   Hyperthyroidism -continue home dose methimazole   ANXIETY DISORDER, GENERALIZED -home dose anxiolytics  Chart review performed and case discussed with ED provider. Labs, imaging and/or ECG reviewed by provider and discussed with patient/family. Management plans discussed with the patient and/or family.  DVT PROPHYLAXIS: SubQ lovenox   GI PROPHYLAXIS:  PPI   ADMISSION STATUS: Observation  CODE STATUS: Full Code Status History    Date Active Date Inactive Code Status Order ID Comments User Context   04/21/2018 1953 04/23/2018 1303 Full Code [id number]  Alford Highland, MD ED   03/29/2018 2255 03/30/2018 1359 Full Code [id number]  Oralia Manis, MD Inpatient   02/25/2016 1439 02/26/2016 1903 Full Code [id number]   Standley Brooking, MD Inpatient   07/11/2015 2131 07/14/2015 1339 Full Code [id number]  Chilton Si, MD Inpatient   04/30/2015 0535 04/30/2015 1849 Full Code [id number]  Haydee Monica, MD Inpatient   10/22/2014 2315 10/23/2014 1059 Full Code [id number]  Haydee Monica, MD Inpatient   10/19/2014 0309 10/20/2014 2038 Full Code [id number]  Eduard Clos, MD Inpatient   09/19/2014 0047 09/19/2014 1655 Full Code [id number]  Pearson Grippe, MD Inpatient   08/08/2014 2134 08/12/2014 1428 Full Code [id number]  Wilson Singer, MD Inpatient   12/05/2013 0623 12/07/2013 1141 Full Code [id number]  Meredeth Ide, MD ED   09/11/2013 2307 09/14/2013 1440 Full Code [id number]  Meredeth Ide, MD Inpatient      TOTAL TIME TAKING CARE OF THIS PATIENT: 40 minutes.   Vedha Tercero FIELDING 07/26/2018, 12:26 AM  [facility]  Office  [phone]  CC: Primary care physician; Bubba Camp, MD  Note:  This document was prepared using Dragon voice recognition software and may include unintentional dictation errors.

## 2018-07-26 NOTE — Progress Notes (Signed)
Patient admitted for chest pain and ordered lovenox 40 mg subq daily for VTE prophylaxis. Patients BMI > 40 CrCl > 30 ml/min  Will adjust lovenox to 40 mg subq bid.  Thomasene Ripple, PharmD, BCPS Clinical Pharmacist 07/26/2018

## 2018-07-26 NOTE — Discharge Summary (Signed)
[facility] [place] at [facility]   PATIENT NAME: [name]    MR#:  [id number]  DATE OF BIRTH:  1956-01-06  DATE OF ADMISSION:  07/25/2018   ADMITTING PHYSICIAN: Oralia Manis, MD  DATE OF DISCHARGE: 07/26/2018  PRIMARY CARE PHYSICIAN: Bubba Camp, MD   ADMISSION DIAGNOSIS:  Acute pulmonary edema (HCC) [J81.0] Chest pain, unspecified type [R07.9] DISCHARGE DIAGNOSIS:  Principal Problem:   Chest pain Active Problems:   ANXIETY DISORDER, GENERALIZED   Essential hypertension, benign   COPD (chronic obstructive pulmonary disease) (HCC)   Coronary atherosclerosis of native coronary artery   (HFpEF) heart failure with preserved ejection fraction (HCC)   Hyperthyroidism  SECONDARY DIAGNOSIS:   Past Medical History:  Diagnosis Date  . Anxiety disorder   . Arthritis   . CAD (coronary artery disease)    a. ~ 2000 cath at [facility];  b. 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  . CHF (congestive heart failure) (HCC)   . Chronic back pain   . Chronic hip pain   . COPD (chronic obstructive pulmonary disease) (HCC)   . Depression   . Essential hypertension, benign   . GERD (gastroesophageal reflux disease)   . Headache(784.0)   . History of alcohol abuse   . History of cocaine abuse (HCC)   . Hyperlipidemia   . Obesity   . Pneumonia    HOSPITAL COURSE:  Atypical chest pain, due to muscleskeletal etiology.   Troponin negative, echocardiogram report is pending. The patient requested pain medication every 4 hours for chest pain.  Chest wall tenderness on palpation on the left side. Per Dr. Gwen Pounds, the patient does not need further cardiac work-up and can be discharged home.   Essential hypertension, benign -continue home dose antihypertensives   COPD (chronic obstructive pulmonary disease) (HCC) -continue home  dose inhalers   Coronary atherosclerosis of native coronary artery -continue home medications, other work-up as above   (HFpEF) heart failure with preserved ejection fraction (HCC) -continue home meds   Hyperthyroidism -continue home dose methimazole   ANXIETY DISORDER, GENERALIZED -home dose anxiolytics Morbid obesity.  Advised diet control and exercise, follow-up PCP. Hypokalemia.  Given potassium, follow-up level with PCP. DISCHARGE CONDITIONS:  Stable, discharge to home today. CONSULTS OBTAINED:  Treatment Team:  Lamar Blinks, MD DRUG ALLERGIES:   Allergies  Allergen Reactions  . Ativan [Lorazepam] Other (See Comments)    "out of mind"  . Haloperidol Decanoate   . Tiotropium Swelling    Per patient his tongue swells up    DISCHARGE MEDICATIONS:   Allergies as of 07/26/2018      Reactions   Ativan [lorazepam] Other (See Comments)   "out of mind"   Haloperidol Decanoate    Tiotropium Swelling   Per patient his tongue swells up       Medication List    TAKE these medications   aspirin 81 MG tablet Take 1 tablet (81 mg total) by mouth daily.   budesonide-formoterol 160-4.5 MCG/ACT inhaler Commonly known as:  SYMBICORT Inhale 2 puffs into the lungs 2 (two) times daily.   buPROPion 150 MG 24 hr tablet Commonly known as:  WELLBUTRIN XL Take 150 mg by mouth daily.   carvedilol 3.125 MG tablet Commonly known as:  COREG Take 1 tablet by mouth 2 (two) times daily.  [facility] MELATONIN 3 MG Tabs Generic drug:  Melatonin Take 1 tablet by mouth at bedtime as needed.   diclofenac sodium 1 % Gel Commonly known as:  VOLTAREN Apply 2 g topically 4 (four) times daily.   doxepin 25 MG capsule Commonly known as:  SINEQUAN Take 25 mg by mouth at bedtime.   furosemide 20 MG tablet Commonly known as:  LASIX Take 40 mg by mouth every morning.   isosorbide mononitrate 60 MG 24 hr tablet Commonly known as:  IMDUR Take 60 mg by mouth daily.   methimazole 10 MG  tablet Commonly known as:  TAPAZOLE Take 20 mg by mouth daily.   nitroGLYCERIN 0.4 MG SL tablet Commonly known as:  NITROSTAT Place 0.4 mg under the tongue every 5 (five) minutes as needed. For chest pain   omeprazole 20 MG capsule Commonly known as:  PRILOSEC Take 20 mg by mouth every morning.   oxyCODONE-acetaminophen 10-325 MG tablet Commonly known as:  PERCOCET Take 1 tablet by mouth every 4 (four) hours as needed for pain.   PATADAY 0.2 % Soln Generic drug:  Olopatadine HCl Apply 2 drops to eye 2 (two) times daily.   predniSONE 20 MG tablet Commonly known as:  DELTASONE Take 40 mg by mouth daily.   albuterol (2.5 MG/3ML) 0.083% nebulizer solution Commonly known as:  PROVENTIL Take 2.5 mg by nebulization every 6 (six) hours as needed for wheezing.   PROAIR HFA 108 (90 Base) MCG/ACT inhaler Generic drug:  albuterol Inhale 2 puffs into the lungs daily as needed for wheezing or shortness of breath.   traZODone 50 MG tablet Commonly known as:  DESYREL Take 1-2 tablets by mouth at bedtime.   VITAMIN E PO Take 1 tablet by mouth daily.        DISCHARGE INSTRUCTIONS:  See AVS.  If you experience worsening of your admission symptoms, develop shortness of breath, life threatening emergency, suicidal or homicidal thoughts you must seek medical attention immediately by calling 911 or calling your MD immediately  if symptoms less severe.  You Must read complete instructions/literature along with all the possible adverse reactions/side effects for all the Medicines you take and that have been prescribed to you. Take any new Medicines after you have completely understood and accpet all the possible adverse reactions/side effects.   Please note  You were cared for by a hospitalist during your hospital stay. If you have any questions about your discharge medications or the care you received while you were in the hospital after you are discharged, you can call the unit and asked  to speak with the hospitalist on call if the hospitalist that took care of you is not available. Once you are discharged, your primary care physician will handle any further medical issues. Please note that NO REFILLS for any discharge medications will be authorized once you are discharged, as it is imperative that you return to your primary care physician (or establish a relationship with a primary care physician if you do not have one) for your aftercare needs so that they can reassess your need for medications and monitor your lab values.    On the day of Discharge:  VITAL SIGNS:  Blood pressure (!) 97/59, pulse 72, temperature 97.6 F (36.4 C), temperature source Oral, resp. rate 20, height 6\' 3"  (1.905 m), weight (!) 158.4 kg, SpO2 97 %. PHYSICAL EXAMINATION:  GENERAL:  62 y.o.-year-old patient lying in the bed with no acute distress.  Morbid obesity. EYES: Pupils equal,  round, reactive to light and accommodation. No scleral icterus. Extraocular muscles intact.  HEENT: Head atraumatic, normocephalic. Oropharynx and nasopharynx clear.  NECK:  Supple, no jugular venous distention. No thyroid enlargement, no tenderness.  LUNGS: Normal breath sounds bilaterally, no wheezing, rales,rhonchi or crepitation. No use of accessory muscles of respiration.  CARDIOVASCULAR: S1, S2 normal. No murmurs, rubs, or gallops.  ABDOMEN: Soft, non-tender, non-distended. Bowel sounds present. No organomegaly or mass.  EXTREMITIES: No pedal edema, cyanosis, or clubbing.  NEUROLOGIC: Cranial nerves II through XII are intact. Muscle strength 5/5 in all extremities. Sensation intact. Gait not checked.  PSYCHIATRIC: The patient is alert and oriented x 3.  SKIN: No obvious rash, lesion, or ulcer.  DATA REVIEW:   CBC Recent Labs  Lab 07/26/18 0557  WBC 9.6  HGB 13.5  HCT 39.8  PLT 220    Chemistries  Recent Labs  Lab 07/25/18 2226 07/26/18 0557 07/26/18 1028  NA 136 138  --   K 3.6 3.3*  --   CL 105 107   --   CO2 22 23  --   GLUCOSE 148* 123*  --   BUN 12 13  --   CREATININE 1.21 1.18  --   CALCIUM 9.1 8.8*  --   MG  --   --  2.1  AST 23  --   --   ALT 15  --   --   ALKPHOS 76  --   --   BILITOT 0.5  --   --      Microbiology Results  Results for orders placed or performed during the hospital encounter of 03/29/18  MRSA PCR Screening     Status: None   Collection Time: 03/30/18 12:27 AM  Result Value Ref Range Status   MRSA by PCR NEGATIVE NEGATIVE Final    Comment:        The GeneXpert MRSA Assay (FDA approved for NASAL specimens only), is one component of a comprehensive MRSA colonization surveillance program. It is not intended to diagnose MRSA infection nor to guide or monitor treatment for MRSA infections. Performed at [facility], [address] [place] [place], [place] [address]     RADIOLOGY:  Dg Chest Port 1 View  Result Date: 07/25/2018 CLINICAL DATA:  Chest pain EXAM: PORTABLE CHEST 1 VIEW COMPARISON:  07/21/2018 FINDINGS: Cardiomegaly with vascular congestion and interstitial prominence, likely interstitial edema. No effusions or acute bony abnormality. IMPRESSION: Cardiomegaly, mild CHF. Electronically Signed   By: Charlett Nose M.D.   On: 07/25/2018 23:24     Management plans discussed with the patient, family and they are in agreement.  CODE STATUS: Full Code   TOTAL TIME TAKING CARE OF THIS PATIENT: 28 minutes.    Shaune Pollack M.D on 07/26/2018 at 4:13 PM  Between 7am to 6pm - Pager - [phone]  After 6pm go to [email or web address] - Social research officer, government  [facility] [place] [facility]  Office  [phone]  CC: Primary care physician; Bubba Camp, MD   Note: This dictation was prepared with Dragon dictation along with smaller phrase technology. Any transcriptional errors that result from this process are unintentional.

## 2018-07-26 NOTE — Progress Notes (Signed)
*  PRELIMINARY RESULTS* Echocardiogram 2D Echocardiogram has been performed. Definity image enhancer was administered.  Joanette Gula Perrie Ragin 07/26/2018, 11:43 AM

## 2018-07-26 NOTE — Progress Notes (Signed)
Discharge teaching given to patient, patient verbalized understanding and had no questions. Patient IV removed. Patient will be transported home by family. All patient belongings gathered prior to leaving.  

## 2018-09-08 ENCOUNTER — Inpatient Hospital Stay
Admission: EM | Admit: 2018-09-08 | Discharge: 2018-09-10 | DRG: 191 | Disposition: A | Discharge status: Home / Self Care | Payer: Medicare Other | Attending: Specialist | Admitting: Specialist

## 2018-09-08 ENCOUNTER — Emergency Department: Payer: Medicare Other

## 2018-09-08 ENCOUNTER — Encounter: Payer: Self-pay | Admitting: Emergency Medicine

## 2018-09-08 ENCOUNTER — Other Ambulatory Visit: Payer: Self-pay

## 2018-09-08 DIAGNOSIS — I2510 Atherosclerotic heart disease of native coronary artery without angina pectoris: Secondary | ICD-10-CM | POA: Diagnosis present

## 2018-09-08 DIAGNOSIS — Z9049 Acquired absence of other specified parts of digestive tract: Secondary | ICD-10-CM

## 2018-09-08 DIAGNOSIS — F419 Anxiety disorder, unspecified: Secondary | ICD-10-CM | POA: Diagnosis present

## 2018-09-08 DIAGNOSIS — G8929 Other chronic pain: Secondary | ICD-10-CM | POA: Diagnosis not present

## 2018-09-08 DIAGNOSIS — I5030 Unspecified diastolic (congestive) heart failure: Secondary | ICD-10-CM | POA: Diagnosis not present

## 2018-09-08 DIAGNOSIS — J101 Influenza due to other identified influenza virus with other respiratory manifestations: Secondary | ICD-10-CM | POA: Diagnosis present

## 2018-09-08 DIAGNOSIS — Z87891 Personal history of nicotine dependence: Secondary | ICD-10-CM | POA: Diagnosis not present

## 2018-09-08 DIAGNOSIS — J441 Chronic obstructive pulmonary disease with (acute) exacerbation: Secondary | ICD-10-CM | POA: Diagnosis present

## 2018-09-08 DIAGNOSIS — Z7951 Long term (current) use of inhaled steroids: Secondary | ICD-10-CM

## 2018-09-08 DIAGNOSIS — F329 Major depressive disorder, single episode, unspecified: Secondary | ICD-10-CM | POA: Diagnosis present

## 2018-09-08 DIAGNOSIS — E876 Hypokalemia: Secondary | ICD-10-CM | POA: Diagnosis not present

## 2018-09-08 DIAGNOSIS — Z888 Allergy status to other drugs, medicaments and biological substances status: Secondary | ICD-10-CM | POA: Diagnosis not present

## 2018-09-08 DIAGNOSIS — Z79891 Long term (current) use of opiate analgesic: Secondary | ICD-10-CM

## 2018-09-08 DIAGNOSIS — Z6841 Body Mass Index (BMI) 40.0 and over, adult: Secondary | ICD-10-CM

## 2018-09-08 DIAGNOSIS — E785 Hyperlipidemia, unspecified: Secondary | ICD-10-CM | POA: Diagnosis present

## 2018-09-08 DIAGNOSIS — Z96642 Presence of left artificial hip joint: Secondary | ICD-10-CM | POA: Diagnosis present

## 2018-09-08 DIAGNOSIS — G4700 Insomnia, unspecified: Secondary | ICD-10-CM | POA: Diagnosis present

## 2018-09-08 DIAGNOSIS — Z79899 Other long term (current) drug therapy: Secondary | ICD-10-CM

## 2018-09-08 DIAGNOSIS — I252 Old myocardial infarction: Secondary | ICD-10-CM

## 2018-09-08 DIAGNOSIS — K219 Gastro-esophageal reflux disease without esophagitis: Secondary | ICD-10-CM | POA: Diagnosis present

## 2018-09-08 DIAGNOSIS — R7303 Prediabetes: Secondary | ICD-10-CM | POA: Diagnosis present

## 2018-09-08 DIAGNOSIS — J849 Interstitial pulmonary disease, unspecified: Secondary | ICD-10-CM | POA: Diagnosis present

## 2018-09-08 DIAGNOSIS — Z951 Presence of aortocoronary bypass graft: Secondary | ICD-10-CM

## 2018-09-08 DIAGNOSIS — Z7952 Long term (current) use of systemic steroids: Secondary | ICD-10-CM

## 2018-09-08 DIAGNOSIS — E0590 Thyrotoxicosis, unspecified without thyrotoxic crisis or storm: Secondary | ICD-10-CM | POA: Diagnosis present

## 2018-09-08 DIAGNOSIS — G4730 Sleep apnea, unspecified: Secondary | ICD-10-CM | POA: Diagnosis present

## 2018-09-08 DIAGNOSIS — Z8249 Family history of ischemic heart disease and other diseases of the circulatory system: Secondary | ICD-10-CM

## 2018-09-08 DIAGNOSIS — I110 Hypertensive heart disease with heart failure: Secondary | ICD-10-CM | POA: Diagnosis not present

## 2018-09-08 DIAGNOSIS — M549 Dorsalgia, unspecified: Secondary | ICD-10-CM | POA: Diagnosis not present

## 2018-09-08 DIAGNOSIS — Z7982 Long term (current) use of aspirin: Secondary | ICD-10-CM

## 2018-09-08 DIAGNOSIS — Z79890 Hormone replacement therapy (postmenopausal): Secondary | ICD-10-CM

## 2018-09-08 DIAGNOSIS — Z882 Allergy status to sulfonamides status: Secondary | ICD-10-CM

## 2018-09-08 DIAGNOSIS — M1990 Unspecified osteoarthritis, unspecified site: Secondary | ICD-10-CM | POA: Diagnosis present

## 2018-09-08 DIAGNOSIS — F411 Generalized anxiety disorder: Secondary | ICD-10-CM | POA: Diagnosis present

## 2018-09-08 LAB — CBC
HCT: 39.4 % (ref 39.0–52.0)
Hemoglobin: 13 g/dL (ref 13.0–17.0)
MCH: 31.5 pg (ref 26.0–34.0)
MCHC: 33 g/dL (ref 30.0–36.0)
MCV: 95.4 fL (ref 80.0–100.0)
Platelets: 210 10*3/uL (ref 150–400)
RBC: 4.13 MIL/uL — ABNORMAL LOW (ref 4.22–5.81)
RDW: 12.9 % (ref 11.5–15.5)
WBC: 9.4 10*3/uL (ref 4.0–10.5)
nRBC: 0 % (ref 0.0–0.2)

## 2018-09-08 LAB — BASIC METABOLIC PANEL
ANION GAP: 7 (ref 5–15)
BUN: 21 mg/dL — ABNORMAL HIGH (ref 6–20)
CO2: 26 mmol/L (ref 22–32)
Calcium: 8.2 mg/dL — ABNORMAL LOW (ref 8.9–10.3)
Chloride: 103 mmol/L (ref 98–111)
Creatinine, Ser: 1.26 mg/dL — ABNORMAL HIGH (ref 0.61–1.24)
GFR calc Af Amer: >60 mL/min (ref >60)
GFR calc non Af Amer: >60 mL/min (ref >60)
GLUCOSE: 192 mg/dL — AB (ref 70–99)
Potassium: 3.3 mmol/L — ABNORMAL LOW (ref 3.5–5.1)
Sodium: 136 mmol/L (ref 135–145)

## 2018-09-08 LAB — INFLUENZA PANEL BY PCR (TYPE A & B)
Influenza A By PCR: NEGATIVE
Influenza B By PCR: POSITIVE — AB

## 2018-09-08 LAB — TROPONIN I: Troponin I: 0.03 ng/mL (ref <0.03)

## 2018-09-08 MED ORDER — IOHEXOL 350 MG/ML SOLN
75.0000 mL | Freq: Once | INTRAVENOUS | Status: AC | PRN
  Administered 2018-09-08: 75 mL via INTRAVENOUS

## 2018-09-08 MED ORDER — OXYCODONE-ACETAMINOPHEN 10-325 MG PO TABS: 1.0000 | ORAL_TABLET | ORAL | Status: DC | PRN

## 2018-09-08 MED ORDER — NITROGLYCERIN 0.4 MG SL SUBL: 0.4000 mg | SUBLINGUAL_TABLET | SUBLINGUAL | Status: DC | PRN

## 2018-09-08 MED ORDER — BUPROPION HCL ER (XL) 150 MG PO TB24
150.0000 mg | ORAL_TABLET | Freq: Every day | ORAL | Status: DC
  Administered 2018-09-09 – 2018-09-10 (×2): 150 mg via ORAL
  Filled 2018-09-08 (×2): qty 1

## 2018-09-08 MED ORDER — POTASSIUM CHLORIDE CRYS ER 20 MEQ PO TBCR
40.0000 meq | EXTENDED_RELEASE_TABLET | Freq: Once | ORAL | Status: AC
  Administered 2018-09-08: 21:00:00 40 meq via ORAL
  Filled 2018-09-08: qty 2

## 2018-09-08 MED ORDER — OXYCODONE-ACETAMINOPHEN 5-325 MG PO TABS
1.0000 | ORAL_TABLET | ORAL | Status: DC | PRN
  Administered 2018-09-09 – 2018-09-10 (×8): 1 via ORAL
  Filled 2018-09-08 (×10): qty 1

## 2018-09-08 MED ORDER — KETOROLAC TROMETHAMINE 30 MG/ML IJ SOLN
15.0000 mg | Freq: Four times a day (QID) | INTRAMUSCULAR | Status: AC | PRN
  Administered 2018-09-09 (×3): 15 mg via INTRAVENOUS
  Filled 2018-09-08 (×3): qty 1

## 2018-09-08 MED ORDER — ALBUTEROL SULFATE (2.5 MG/3ML) 0.083% IN NEBU: 2.5000 mg | INHALATION_SOLUTION | RESPIRATORY_TRACT | Status: DC | PRN

## 2018-09-08 MED ORDER — OLOPATADINE HCL 0.1 % OP SOLN
1.0000 [drp] | Freq: Two times a day (BID) | OPHTHALMIC | Status: DC
  Administered 2018-09-09 – 2018-09-10 (×3): 1 [drp] via OPHTHALMIC
  Filled 2018-09-08: qty 5

## 2018-09-08 MED ORDER — METHYLPREDNISOLONE SODIUM SUCC 125 MG IJ SOLR: 60.0000 mg | Freq: Three times a day (TID) | INTRAMUSCULAR | Status: AC

## 2018-09-08 MED ORDER — AZITHROMYCIN 500 MG PO TABS: 500.0000 mg | ORAL_TABLET | Freq: Every day | ORAL | Status: DC

## 2018-09-08 MED ORDER — CARVEDILOL 3.125 MG PO TABS
3.1250 mg | ORAL_TABLET | Freq: Two times a day (BID) | ORAL | Status: DC
  Administered 2018-09-08 – 2018-09-10 (×4): 3.125 mg via ORAL
  Filled 2018-09-08 (×4): qty 1

## 2018-09-08 MED ORDER — MAGNESIUM SULFATE 2 GM/50ML IV SOLN
2.0000 g | INTRAVENOUS | Status: AC
  Administered 2018-09-08: 2 g via INTRAVENOUS
  Filled 2018-09-08: qty 50

## 2018-09-08 MED ORDER — METHIMAZOLE 10 MG PO TABS
20.0000 mg | ORAL_TABLET | Freq: Every day | ORAL | Status: DC
  Administered 2018-09-09 – 2018-09-10 (×2): 20 mg via ORAL
  Filled 2018-09-08 (×2): qty 2

## 2018-09-08 MED ORDER — PANTOPRAZOLE SODIUM 40 MG PO TBEC
40.0000 mg | DELAYED_RELEASE_TABLET | Freq: Every day | ORAL | Status: DC
  Administered 2018-09-09 – 2018-09-10 (×2): 40 mg via ORAL
  Filled 2018-09-08 (×2): qty 1

## 2018-09-08 MED ORDER — IPRATROPIUM-ALBUTEROL 0.5-2.5 (3) MG/3ML IN SOLN
3.0000 mL | Freq: Once | RESPIRATORY_TRACT | Status: AC
  Administered 2018-09-08: 3 mL via RESPIRATORY_TRACT
  Filled 2018-09-08: qty 3

## 2018-09-08 MED ORDER — KETOROLAC TROMETHAMINE 30 MG/ML IJ SOLN
15.0000 mg | INTRAMUSCULAR | Status: AC
  Administered 2018-09-08: 15 mg via INTRAVENOUS
  Filled 2018-09-08: qty 1

## 2018-09-08 MED ORDER — FUROSEMIDE 40 MG PO TABS
40.0000 mg | ORAL_TABLET | Freq: Every morning | ORAL | Status: DC
  Administered 2018-09-09 – 2018-09-10 (×2): 40 mg via ORAL
  Filled 2018-09-08 (×3): qty 1

## 2018-09-08 MED ORDER — SODIUM CHLORIDE 0.9 % IV SOLN
500.0000 mg | INTRAVENOUS | Status: AC
  Administered 2018-09-08: 23:00:00 500 mg via INTRAVENOUS
  Filled 2018-09-08: qty 500

## 2018-09-08 MED ORDER — ISOSORBIDE MONONITRATE ER 30 MG PO TB24
60.0000 mg | ORAL_TABLET | Freq: Every day | ORAL | Status: DC
  Administered 2018-09-09 – 2018-09-10 (×2): 60 mg via ORAL
  Filled 2018-09-08 (×2): qty 2

## 2018-09-08 MED ORDER — METHYLPREDNISOLONE SODIUM SUCC 125 MG IJ SOLR
125.0000 mg | Freq: Once | INTRAMUSCULAR | Status: AC
  Administered 2018-09-08: 125 mg via INTRAVENOUS
  Filled 2018-09-08: qty 2

## 2018-09-08 MED ORDER — ALBUTEROL SULFATE (2.5 MG/3ML) 0.083% IN NEBU
5.0000 mg | INHALATION_SOLUTION | Freq: Once | RESPIRATORY_TRACT | Status: AC
  Administered 2018-09-08: 5 mg via RESPIRATORY_TRACT
  Filled 2018-09-08: qty 6

## 2018-09-08 MED ORDER — SODIUM CHLORIDE 0.9% FLUSH: 3.0000 mL | Freq: Once | INTRAVENOUS | Status: DC

## 2018-09-08 MED ORDER — IPRATROPIUM-ALBUTEROL 0.5-2.5 (3) MG/3ML IN SOLN
3.0000 mL | Freq: Four times a day (QID) | RESPIRATORY_TRACT | Status: DC
  Administered 2018-09-09 (×2): 3 mL via RESPIRATORY_TRACT
  Filled 2018-09-08 (×5): qty 3

## 2018-09-08 MED ORDER — ASPIRIN EC 81 MG PO TBEC
81.0000 mg | DELAYED_RELEASE_TABLET | Freq: Every day | ORAL | Status: DC
  Administered 2018-09-09 – 2018-09-10 (×2): 81 mg via ORAL
  Filled 2018-09-08 (×2): qty 1

## 2018-09-08 MED ORDER — OXYCODONE HCL 5 MG PO TABS
5.0000 mg | ORAL_TABLET | ORAL | Status: DC | PRN
  Administered 2018-09-08 – 2018-09-10 (×6): 5 mg via ORAL
  Filled 2018-09-08 (×7): qty 1

## 2018-09-08 MED ORDER — TRAZODONE HCL 50 MG PO TABS
50.0000 mg | ORAL_TABLET | Freq: Every day | ORAL | Status: DC
  Administered 2018-09-08 – 2018-09-09 (×2): 100 mg via ORAL
  Filled 2018-09-08 (×2): qty 2

## 2018-09-08 NOTE — ED Notes (Signed)
Patient called out due to alarms going off. RN rounded on patient and fixed alarms

## 2018-09-08 NOTE — ED Notes (Signed)
Patient transported to X-ray 

## 2018-09-08 NOTE — ED Notes (Signed)
Patient given sandwich tray. 

## 2018-09-08 NOTE — ED Notes (Signed)
Patient transported to CT 

## 2018-09-08 NOTE — ED Triage Notes (Signed)
Pt here with c/o shob and midsternal cp that began about an hour ago. Pt states he was just d/c'd from the hospital with "bad cough, chf, and copd exacerbation," pt states he is unable to breathe when he lays down, expiratory wheezing noted in triage, appears shob. Is having some numbness from his left shoulder to his left elbow as well that just began with the cp.

## 2018-09-08 NOTE — Progress Notes (Signed)
Family Meeting Note  Advance Directive:yes  Today a meeting took place with the Patient.   The following clinical team members were present during this meeting:MD  The following were discussed:Patient's diagnosis: Acute respiratory distress, COPD exacerbation, hypokalemia, chronic history of congestive heart failure, hyperlipidemia not on any statins, coronary artery disease status post CABG, essential hypertension, obesity and other medical problems and treatment plan of care discussed in detail with the patient.  He verbalized understanding of the plan.   Patient's progosis: Unable to determine and Goals for treatment: Full Code  Son Daquante Callis is the healthcare power of attorney  Additional follow-up to be provided: Hospitalist  Time spent during discussion:17 min  Ramonita Lab, MD

## 2018-09-08 NOTE — ED Notes (Signed)
PAtient given [name]

## 2018-09-08 NOTE — ED Notes (Signed)
PAtient given ice cream

## 2018-09-08 NOTE — ED Provider Notes (Signed)
[facility] Emergency Department Provider Note  ____________________________________________  Time seen: Approximately 5:53 PM  I have reviewed the triage vital signs and the nursing notes.   HISTORY  Chief Complaint Shortness of Breath and Chest Pain    HPI [name] FIALLOS Sr. is a 63 y.o. male with a history of CAD status post CABG, CHF, COPD who complains of shortness of breath and midsternal tightness that is nonradiating, ongoing for the past week.  He was at [facility] hospital for the last several days, discharged about 3 days ago.  He reports that during his time there he did not feel any better and once back home he just continued to feel worse again.   Has a nonproductive cough.  No fevers.  Positive chills.  No vomiting or diarrhea.  Eating normally.  Symptoms are worse lying supine, better sitting upright.  Patient is more comfortable sitting upright.  Nonradiating.   Past Medical History:  Diagnosis Date  . Anxiety disorder   . Arthritis   . CAD (coronary artery disease)    a. ~ 2000 cath at [facility];  b. 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  . CHF (congestive heart failure) (HCC)   . Chronic back pain   . Chronic hip pain   . COPD (chronic obstructive pulmonary disease) (HCC)   . Depression   . Essential hypertension, benign   . GERD (gastroesophageal reflux disease)   . Headache(784.0)   . History of alcohol abuse   . History of cocaine abuse (HCC)   . Hyperlipidemia   . Obesity   . Pneumonia      Patient Active Problem List   Diagnosis Date Noted  . COPD with acute exacerbation (HCC) 09/08/2018  . Hyperthyroidism 07/26/2018  . NSTEMI (non-ST elevated myocardial infarction) (HCC) 07/11/2015  . Chest pain 04/30/2015  . Syncope 04/30/2015  . Orthostatic syncope 04/30/2015  . Chronic back pain    . (HFpEF) heart failure with preserved ejection fraction (HCC)   . History of cocaine abuse (HCC)   . History of alcohol abuse   . Prediabetes 08/12/2014  . Tobacco abuse 09/21/2013  . Sleep apnea- C-pap intol 09/14/2013  . Coronary atherosclerosis of native coronary artery   . OBESITY, MORBID 05/29/2008  . Dyslipidemia 01/19/2007  . ANXIETY DISORDER, GENERALIZED 01/19/2007  . Depression 10/20/2006  . Essential hypertension, benign 10/20/2006  . COPD (chronic obstructive pulmonary disease) (HCC) 10/20/2006     Past Surgical History:  Procedure Laterality Date  . BACK SURGERY    . CHOLECYSTECTOMY    . CORONARY ARTERY BYPASS GRAFT    . HIP SURGERY     rt  . JOINT REPLACEMENT    . LEFT HEART CATHETERIZATION WITH CORONARY ANGIOGRAM N/A 09/12/2013   Procedure: LEFT HEART CATHETERIZATION WITH CORONARY ANGIOGRAM;  Surgeon: Kathleene Hazel, MD;  Location: [facility] CATH LAB;  Service: Cardiovascular;  Laterality: N/A;  . NASAL/SINUS ENDOSCOPY    . TOTAL HIP ARTHROPLASTY  05/08/2012   Procedure: TOTAL HIP ARTHROPLASTY;  Surgeon: Nestor Lewandowsky, MD;  Location: [facility] OR;  Service: Orthopedics;  Laterality: Left;  . TOTAL HIP ARTHROPLASTY  2014 per pt     Prior to Admission medications   Medication Sig Start Date End Date Taking? Authorizing Provider  albuterol (PROVENTIL) (2.5 MG/3ML) 0.083% nebulizer solution Take 2.5 mg  by nebulization every 6 (six) hours as needed for wheezing.   Yes [provider]  aspirin 81 MG tablet Take 1 tablet (81 mg total) by mouth daily. 05/31/12  Yes Kathlen ModyAkula, Vijaya, MD  budesonide-formoterol (SYMBICORT) 160-4.5 MCG/ACT inhaler Inhale 2 puffs into the lungs 2 (two) times daily.   Yes [provider]  buPROPion (WELLBUTRIN XL) 150 MG 24 hr tablet Take 150 mg by mouth daily. 07/15/18  Yes [provider]  carvedilol (COREG) 3.125 MG tablet Take 1 tablet by mouth 2 (two) times daily. 02/21/18  Yes [provider]  [employer] MELATONIN 3 MG  TABS Take 1 tablet by mouth at bedtime as needed. 03/22/18  Yes [provider]  furosemide (LASIX) 20 MG tablet Take 40 mg by mouth every morning.    Yes [provider]  isosorbide mononitrate (IMDUR) 60 MG 24 hr tablet Take 60 mg by mouth daily. 06/22/18  Yes [provider]  methimazole (TAPAZOLE) 10 MG tablet Take 20 mg by mouth daily. 07/21/18  Yes [provider]  nitroGLYCERIN (NITROSTAT) 0.4 MG SL tablet Place 0.4 mg under the tongue every 5 (five) minutes as needed. For chest pain   Yes [provider]  Olopatadine HCl (PATADAY) 0.2 % SOLN Apply 2 drops to eye 2 (two) times daily.   Yes [provider]  omeprazole (PRILOSEC) 20 MG capsule Take 20 mg by mouth every morning.    Yes [provider]  oxyCODONE-acetaminophen (PERCOCET) 10-325 MG tablet Take 1 tablet by mouth every 4 (four) hours as needed for pain.   Yes [provider]  predniSONE (DELTASONE) 20 MG tablet Take 40 mg by mouth daily. 04/25/18  Yes [provider]  PROAIR HFA 108 (90 BASE) MCG/ACT inhaler Inhale 2 puffs into the lungs daily as needed for wheezing or shortness of breath.  06/20/13  Yes [provider]  traZODone (DESYREL) 50 MG tablet Take 1-2 tablets by mouth at bedtime. 03/22/18  Yes [provider]     Allergies Ativan [lorazepam]; Atorvastatin; Fluticasone furoate-vilanterol; Haloperidol decanoate; Tiotropium; and Sulfamethoxazole-trimethoprim   Family History  Problem Relation Age of Onset  . Heart attack Mother   . Heart attack Brother   . Heart attack Sister   . Heart attack Father     Social History Social History   Tobacco Use  . Smoking status: Former Smoker    Packs/day: 0.50    Years: 39.00    Pack years: 19.50    Types: Cigarettes    Last attempt to quit: 09/15/2015    Years since quitting: 2.9  . Smokeless tobacco: Never Used  Substance Use Topics  . Alcohol use: Yes    Alcohol/week:  0.0 standard drinks    Comment: socially; reports drinking 2 beers last nigh  . Drug use: No    Review of Systems  Constitutional:   No fever or chills.  ENT:   No sore throat. No rhinorrhea. Cardiovascular:   Positive chest pain as above without syncope. Respiratory:   Shortness of breath and nonproductive cough. Gastrointestinal:   Negative for abdominal pain, vomiting and diarrhea.  Musculoskeletal:   Negative for focal pain or swelling All other systems reviewed and are negative except as documented above in ROS and HPI.  ____________________________________________   PHYSICAL EXAM:  VITAL SIGNS: ED Triage Vitals  Enc Vitals Group     BP 09/08/18 1438 102/65     Pulse Rate 09/08/18 1438 83     Resp 09/08/18  1438 17     Temp --      Temp src --      SpO2 09/08/18 1438 96 %     Weight 09/08/18 1431 (!) 352 lb (159.7 kg)     Height 09/08/18 1431 6\' 3"  (1.905 m)     Head Circumference --      Peak Flow --      Pain Score 09/08/18 1430 7     Pain Loc --      Pain Edu? --      Excl. in GC? --     Vital signs reviewed, nursing assessments reviewed.   Constitutional:   Alert and oriented. Non-toxic appearance. Eyes:   Conjunctivae are normal. EOMI. PERRL. ENT      Head:   Normocephalic and atraumatic.      Nose:   No congestion/rhinnorhea.       Mouth/Throat:   MMM, no pharyngeal erythema. No peritonsillar mass.       Neck:   No meningismus. Full ROM. Hematological/Lymphatic/Immunilogical:   No cervical lymphadenopathy. Cardiovascular:   RRR. Symmetric bilateral radial and DP pulses.  No murmurs. Cap refill less than 2 seconds. Respiratory:   Tachypnea.  Diffuse expiratory wheezing with prolonged expiratory phase.  No focal crackles.  Symmetric air entry in all lung fields. Gastrointestinal:   Soft and nontender. Non distended. There is no CVA tenderness.  No rebound, rigidity, or guarding.  Musculoskeletal:   Normal range of motion in all extremities. No joint  effusions.  No lower extremity tenderness.  No edema.  Extensive scarring and deformity of the anterior chest owing to prior sternotomy. Neurologic:   Normal speech and language.  Motor grossly intact. No acute focal neurologic deficits are appreciated.  Skin:    Skin is warm, dry and intact. No rash noted.  No petechiae, purpura, or bullae.  ____________________________________________    LABS (pertinent positives/negatives) (all labs ordered are listed, but only abnormal results are displayed) Labs Reviewed  BASIC METABOLIC PANEL - Abnormal; Notable for the following components:      Result Value   Potassium 3.3 (*)    Glucose, Bld 192 (*)    BUN 21 (*)    Creatinine, Ser 1.26 (*)    Calcium 8.2 (*)    All other components within normal limits  CBC - Abnormal; Notable for the following components:   RBC 4.13 (*)    All other components within normal limits  TROPONIN I - Abnormal; Notable for the following components:   Troponin I 0.03 (*)    All other components within normal limits  INFLUENZA PANEL BY PCR (TYPE A & B) - Abnormal; Notable for the following components:   Influenza B By PCR POSITIVE (*)    All other components within normal limits   ____________________________________________   EKG  Interpreted by me Normal sinus rhythm rate of 89, left axis, normal intervals.  Poor R wave progression.  Normal ST segments and T waves.  ____________________________________________    RADIOLOGY  Dg Chest 2 View  Result Date: 09/08/2018 CLINICAL DATA:  Shortness of breath EXAM: CHEST - 2 VIEW COMPARISON:  09/02/2018 FINDINGS: There is mild bilateral chronic interstitial thickening. There is no focal consolidation. There is no pleural effusion or pneumothorax. There is stable cardiomegaly. There is evidence of prior CABG. The osseous structures are unremarkable. IMPRESSION: 1. Bilateral chronic interstitial lung disease. 2. Cardiomegaly. Electronically Signed   By: Elige Ko   On: 09/08/2018 15:45  Ct Angio Chest Pe W And/or Wo Contrast  Result Date: 09/08/2018 CLINICAL DATA:  Cough, COPD, midsternal chest pain EXAM: CT ANGIOGRAPHY CHEST WITH CONTRAST TECHNIQUE: Multidetector CT imaging of the chest was performed using the standard protocol during bolus administration of intravenous contrast. Multiplanar CT image reconstructions and MIPs were obtained to evaluate the vascular anatomy. CONTRAST:  26mL OMNIPAQUE IOHEXOL 350 MG/ML SOLN COMPARISON:  None. FINDINGS: Cardiovascular: Satisfactory opacification of the pulmonary arteries to the segmental level. No evidence of pulmonary embolism. Stable cardiomegaly. No pericardial effusion. Prior CABG. Mediastinum/Nodes: No enlarged mediastinal, hilar, or axillary lymph nodes. Thyroid gland, trachea, and esophagus demonstrate no significant findings. Lungs/Pleura: Lungs are clear. No pleural effusion or pneumothorax. Upper Abdomen: No acute abnormality. Musculoskeletal: No chest wall abnormality. No acute or significant osseous findings. Review of the MIP images confirms the above findings. IMPRESSION: 1. No evidence of pulmonary embolus. 2. No acute cardiopulmonary disease. Electronically Signed   By: Elige Ko   On: 09/08/2018 16:39    ____________________________________________   PROCEDURES Procedures  ____________________________________________  DIFFERENTIAL DIAGNOSIS   COPD exacerbation, pneumonia, pneumothorax, pulmonary embolism, influenza  CLINICAL IMPRESSION / ASSESSMENT AND PLAN / ED COURSE  Pertinent labs & imaging results that were available during my care of the patient were reviewed by me and considered in my medical decision making (see chart for details).    Patient presents with persistent shortness of breath wheezing and coughing.  Due to his recent hospitalization and failure to improve despite usual care, this raises the concern for PE.  CT scan was obtained which fortunately was negative  for PE and did not show any other acute issues.  Influenza test returned positive.  Unclear when the onset of his influenza infection was so I will start Tamiflu given his severe underlying comorbidities.  Case discussed with the hospitalist for further management.      ____________________________________________   FINAL CLINICAL IMPRESSION(S) / ED DIAGNOSES    Final diagnoses:  COPD exacerbation (HCC)  Influenza B     ED Discharge Orders    None      Portions of this note were generated with dragon dictation software. Dictation errors may occur despite best attempts at proofreading.   Sharman Cheek, MD 09/08/18 [phone]

## 2018-09-08 NOTE — H&P (Signed)
[facility] - [place] at [facility]   PATIENT NAME: [name]    MR#:  [id number]  DATE OF BIRTH:  Jan 23, 1956  DATE OF ADMISSION:  09/08/2018  PRIMARY CARE PHYSICIAN: Bubba Campohen, Cerrone Akil, MD   REQUESTING/REFERRING PHYSICIAN: Scotty CourtStafford  CHIEF COMPLAINT:  Shortness of breath  HISTORY OF PRESENT ILLNESS:  [name] Lowlvin Courser  is a 63 y.o. male with a known history of coronary artery disease status post CABG, COPD, congestive heart failure, hyperlipidemia, hypertension and other medical problem is presenting to the ED with a chief complaint of shortness of breath associated with cough and diffuse wheezing.  CT chest has revealed interstitial lung disease.  Patient is given Solu-Medrol, bronchodilator treatments and hospitalist team is called to admit the patient.  Patient denies any chest pain during my examination but reporting wheezing  PAST MEDICAL HISTORY:   Past Medical History:  Diagnosis Date  . Anxiety disorder   . Arthritis   . CAD (coronary artery disease)    a. ~ [facility];  b. 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  . CHF (congestive heart failure) (HCC)   . Chronic back pain   . Chronic hip pain   . COPD (chronic obstructive pulmonary disease) (HCC)   . Depression   . Essential hypertension, benign   . GERD (gastroesophageal reflux disease)   . Headache(784.0)   . History of alcohol abuse   . History of cocaine abuse (HCC)   . Hyperlipidemia   . Obesity   . Pneumonia     PAST SURGICAL HISTOIRY:   Past Surgical History:  Procedure Laterality Date  . BACK SURGERY    . CHOLECYSTECTOMY    . CORONARY ARTERY BYPASS GRAFT    . HIP SURGERY     rt  . JOINT REPLACEMENT    . LEFT HEART CATHETERIZATION WITH CORONARY ANGIOGRAM N/A 09/12/2013   Procedure: LEFT HEART CATHETERIZATION WITH CORONARY  ANGIOGRAM;  Surgeon: Kathleene Hazelhristopher D McAlhany, MD;  [facility];  Service: Cardiovascular;  Laterality: N/A;  . NASAL/SINUS ENDOSCOPY    . TOTAL HIP ARTHROPLASTY  05/08/2012   Procedure: TOTAL HIP ARTHROPLASTY;  Surgeon: Nestor LewandowskyFrank J Rowan, MD;  Location: [facility] OR;  Service: Orthopedics;  Laterality: Left;  . TOTAL HIP ARTHROPLASTY  2014 per pt    SOCIAL HISTORY:   Social History   Tobacco Use  . Smoking status: Former Smoker    Packs/day: 0.50    Years: 39.00    Pack years: 19.50    Types: Cigarettes    Last attempt to quit: 09/15/2015    Years since quitting: 2.9  . Smokeless tobacco: Never Used  Substance Use Topics  . Alcohol use: Yes    Alcohol/week: 0.0 standard drinks    Comment: socially; reports drinking 2 beers last nigh    FAMILY HISTORY:   Family History  Problem Relation Age of Onset  . Heart attack Mother   . Heart attack Brother   . Heart attack Sister   . Heart attack Father     DRUG ALLERGIES:   Allergies  Allergen Reactions  . Ativan [Lorazepam] Other (See Comments)    "out of mind"  . Atorvastatin Other (See Comments)    Joint soreness  . Fluticasone Furoate-Vilanterol Swelling    Tongue swelling  . Haloperidol Decanoate   .  Tiotropium Swelling    Per patient his tongue swells up   . Sulfamethoxazole-Trimethoprim Itching    Reports itching    REVIEW OF SYSTEMS:  CONSTITUTIONAL: No fever, fatigue or weakness.  EYES: No blurred or double vision.  EARS, NOSE, AND THROAT: No tinnitus or ear pain.  RESPIRATORY: Reporting cough, shortness of breath, wheezing ,denies hemoptysis.  CARDIOVASCULAR: No chest pain, orthopnea, edema.  GASTROINTESTINAL: No nausea, vomiting, diarrhea or abdominal pain.  GENITOURINARY: No dysuria, hematuria.  ENDOCRINE: No polyuria, nocturia,  HEMATOLOGY: No anemia, easy bruising or bleeding SKIN: No rash or lesion. MUSCULOSKELETAL: No joint pain or arthritis.   NEUROLOGIC: No tingling, numbness, weakness.   PSYCHIATRY: No anxiety or depression.   MEDICATIONS AT HOME:   Prior to Admission medications   Medication Sig Start Date End Date Taking? Authorizing Provider  albuterol (PROVENTIL) (2.5 MG/3ML) 0.083% nebulizer solution Take 2.5 mg by nebulization every 6 (six) hours as needed for wheezing.   Yes [provider]  aspirin 81 MG tablet Take 1 tablet (81 mg total) by mouth daily. 05/31/12  Yes Kathlen ModyAkula, Vijaya, MD  budesonide-formoterol (SYMBICORT) 160-4.5 MCG/ACT inhaler Inhale 2 puffs into the lungs 2 (two) times daily.   Yes [provider]  buPROPion (WELLBUTRIN XL) 150 MG 24 hr tablet Take 150 mg by mouth daily. 07/15/18  Yes [provider]  carvedilol (COREG) 3.125 MG tablet Take 1 tablet by mouth 2 (two) times daily. 02/21/18  Yes [provider]  [facility] MELATONIN 3 MG TABS Take 1 tablet by mouth at bedtime as needed. 03/22/18  Yes [provider]  furosemide (LASIX) 20 MG tablet Take 40 mg by mouth every morning.    Yes [provider]  isosorbide mononitrate (IMDUR) 60 MG 24 hr tablet Take 60 mg by mouth daily. 06/22/18  Yes [provider]  methimazole (TAPAZOLE) 10 MG tablet Take 20 mg by mouth daily. 07/21/18  Yes [provider]  nitroGLYCERIN (NITROSTAT) 0.4 MG SL tablet Place 0.4 mg under the tongue every 5 (five) minutes as needed. For chest pain   Yes [provider]  Olopatadine HCl (PATADAY) 0.2 % SOLN Apply 2 drops to eye 2 (two) times daily.   Yes [provider]  omeprazole (PRILOSEC) 20 MG capsule Take 20 mg by mouth every morning.    Yes [provider]  oxyCODONE-acetaminophen (PERCOCET) 10-325 MG tablet Take 1 tablet by mouth every 4 (four) hours as needed for pain.   Yes [provider]  predniSONE (DELTASONE) 20 MG tablet Take 40 mg by mouth daily. 04/25/18  Yes [provider]  PROAIR HFA 108 (90 BASE) MCG/ACT inhaler Inhale 2 puffs into the lungs daily as needed  for wheezing or shortness of breath.  06/20/13  Yes [provider]  traZODone (DESYREL) 50 MG tablet Take 1-2 tablets by mouth at bedtime. 03/22/18  Yes [provider]      VITAL SIGNS:  Blood pressure 102/65, pulse 78, resp. rate 18, height 6\' 3"  (1.905 m), weight (!) 159.7 kg, SpO2 99 %.  PHYSICAL EXAMINATION:  GENERAL:  63 y.o.-year-old patient lying in the bed with no acute distress.  EYES: Pupils equal, round, reactive to light and accommodation. No scleral icterus. Extraocular muscles intact.  HEENT: Head atraumatic, normocephalic. Oropharynx and nasopharynx clear.  NECK:  Supple, no jugular venous distention. No thyroid enlargement, no tenderness.  LUNGS: Moderately diminished breath sounds bilaterally, diffuse wheezing, no rales,rhonchi or crepitation. No use of accessory muscles of respiration.  CARDIOVASCULAR: S1, S2 normal. No murmurs, rubs, or gallops.  Midsternal incision from CABG healed well ABDOMEN: Soft, nontender, nondistended. Bowel sounds present. No organomegaly or mass.  EXTREMITIES: No pedal edema, cyanosis, or clubbing.  NEUROLOGIC: Awake alert and oriented x3 sensation intact. Gait not checked.  PSYCHIATRIC: The patient is alert and oriented x 3.  SKIN: No obvious rash, lesion, or ulcer.   LABORATORY PANEL:   CBC Recent Labs  Lab 09/08/18 1506  WBC 9.4  HGB 13.0  HCT 39.4  PLT 210   ------------------------------------------------------------------------------------------------------------------  Chemistries  Recent Labs  Lab 09/08/18 1506  NA 136  K 3.3*  CL 103  CO2 26  GLUCOSE 192*  BUN 21*  CREATININE 1.26*  CALCIUM 8.2*   ------------------------------------------------------------------------------------------------------------------  Cardiac Enzymes Recent Labs  Lab 09/08/18 1506  TROPONINI 0.03*    ------------------------------------------------------------------------------------------------------------------  RADIOLOGY:  Dg Chest 2 View  Result Date: 09/08/2018 CLINICAL DATA:  Shortness of breath EXAM: CHEST - 2 VIEW COMPARISON:  09/02/2018 FINDINGS: There is mild bilateral chronic interstitial thickening. There is no focal consolidation. There is no pleural effusion or pneumothorax. There is stable cardiomegaly. There is evidence of prior CABG. The osseous structures are unremarkable. IMPRESSION: 1. Bilateral chronic interstitial lung disease. 2. Cardiomegaly. Electronically Signed   By: Elige Ko   On: 09/08/2018 15:45   Ct Angio Chest Pe W And/or Wo Contrast  Result Date: 09/08/2018 CLINICAL DATA:  Cough, COPD, midsternal chest pain EXAM: CT ANGIOGRAPHY CHEST WITH CONTRAST TECHNIQUE: Multidetector CT imaging of the chest was performed using the standard protocol during bolus administration of intravenous contrast. Multiplanar CT image reconstructions and MIPs were obtained to evaluate the vascular anatomy. CONTRAST:  24mL OMNIPAQUE IOHEXOL 350 MG/ML SOLN COMPARISON:  None. FINDINGS: Cardiovascular: Satisfactory opacification of the pulmonary arteries to the segmental level. No evidence of pulmonary embolism. Stable cardiomegaly. No pericardial effusion. Prior CABG. Mediastinum/Nodes: No enlarged mediastinal, hilar, or axillary lymph nodes. Thyroid gland, trachea, and esophagus demonstrate no significant findings. Lungs/Pleura: Lungs are clear. No pleural effusion or pneumothorax. Upper Abdomen: No acute abnormality. Musculoskeletal: No chest wall abnormality. No acute or significant osseous findings. Review of the MIP images confirms the above findings. IMPRESSION: 1. No evidence of pulmonary embolus. 2. No acute cardiopulmonary disease. Electronically Signed   By: Elige Ko   On: 09/08/2018 16:39    EKG:   Orders placed or performed during the hospital encounter of 09/08/18  .  EKG 12-Lead  . EKG 12-Lead  . ED EKG  . ED EKG    IMPRESSION AND PLAN:    #Acute respiratory distress from COPD exacerbation Admit to MedSurg unit IV Solu-Medrol, bronchodilator treatments and azithromycin  #Hypokalemia replete and recheck in a.m.  #Chronic congestive heart failure no exacerbation continue home medications Lasix and Coreg  #History of coronary artery disease status post CABG Troponin of 0.03 continue close monitoring  continue home medications Imdur, Coreg, aspirin 81 mg, nitroglycerin as needed  #Insomnia trazodone  #Hyperlipidemia patient is not on any cholesterol medication as he is allergic to atorvastatin   All the records are reviewed and case discussed with ED provider. Management plans discussed with the patient, family and they are in agreement.  CODE STATUS: FC   TOTAL TIME TAKING CARE OF THIS PATIENT: 43  minutes.   Note: This dictation was prepared with Dragon dictation along with smaller phrase technology. Any transcriptional errors that result from this process are unintentional.  Ramonita Lab M.D on 09/08/2018 at 6:38 PM  Between 7am  to 6pm - Pager - [phone]  After 6pm go to [email or web address] - password [email or web address] [facility]  [facility]  Office  [phone]  CC: Primary care physician; Bubba Camp, MD

## 2018-09-09 DIAGNOSIS — J441 Chronic obstructive pulmonary disease with (acute) exacerbation: Secondary | ICD-10-CM | POA: Diagnosis not present

## 2018-09-09 DIAGNOSIS — J101 Influenza due to other identified influenza virus with other respiratory manifestations: Secondary | ICD-10-CM | POA: Diagnosis not present

## 2018-09-09 MED ORDER — HEPARIN SODIUM (PORCINE) 5000 UNIT/ML IJ SOLN
5000.0000 [IU] | Freq: Three times a day (TID) | INTRAMUSCULAR | Status: DC
  Administered 2018-09-09 – 2018-09-10 (×4): 5000 [IU] via SUBCUTANEOUS
  Filled 2018-09-09 (×4): qty 1

## 2018-09-09 MED ORDER — METHYLPREDNISOLONE SODIUM SUCC 40 MG IJ SOLR
40.0000 mg | Freq: Two times a day (BID) | INTRAMUSCULAR | Status: DC
  Administered 2018-09-09 (×2): 40 mg via INTRAVENOUS
  Filled 2018-09-09 (×3): qty 1

## 2018-09-09 MED ORDER — IPRATROPIUM-ALBUTEROL 0.5-2.5 (3) MG/3ML IN SOLN
3.0000 mL | Freq: Three times a day (TID) | RESPIRATORY_TRACT | Status: DC
  Administered 2018-09-10: 3 mL via RESPIRATORY_TRACT
  Filled 2018-09-09: qty 3

## 2018-09-09 MED ORDER — KETOROLAC TROMETHAMINE 15 MG/ML IJ SOLN
15.0000 mg | Freq: Four times a day (QID) | INTRAMUSCULAR | Status: DC | PRN
  Administered 2018-09-09 – 2018-09-10 (×2): 15 mg via INTRAVENOUS
  Filled 2018-09-09 (×3): qty 1

## 2018-09-09 MED ORDER — BUDESONIDE 0.5 MG/2ML IN SUSP
0.5000 mg | Freq: Two times a day (BID) | RESPIRATORY_TRACT | Status: DC
  Administered 2018-09-10: 0.5 mg via RESPIRATORY_TRACT
  Filled 2018-09-09 (×3): qty 2

## 2018-09-09 MED ORDER — OSELTAMIVIR PHOSPHATE 75 MG PO CAPS
75.0000 mg | ORAL_CAPSULE | Freq: Two times a day (BID) | ORAL | Status: DC
  Administered 2018-09-09 – 2018-09-10 (×3): 75 mg via ORAL
  Filled 2018-09-09 (×3): qty 1

## 2018-09-09 NOTE — Progress Notes (Signed)
OT Cancellation Note  Patient Details Name: [name] DUKART Sr. MRN: [id number] DOB: [birth year]   OT Screen:    Reason Eval/Treat Not Completed: OT screened, no needs identified, will sign off. Order received, chart reviewed. Spoke with PT, Pt back to baseline functional independence. No skilled OT needs identified. Will sign off. Please re-consult if additional needs arise.   Rockney Ghee, M.S., OTR/L Ascom: [phone] 09/09/18, 9:40 AM

## 2018-09-09 NOTE — Progress Notes (Signed)
[facility] - [place] at [facility]     PATIENT NAME: [name]    MR#:  [id number]  DATE OF BIRTH:  1956-04-15  SUBJECTIVE:   Pt. Here due to cough, congestion, shortness of breath and noted to have COPD exacerbation secondary to the flu.  Feels better since yesterday.  REVIEW OF SYSTEMS:    Review of Systems  Constitutional: Negative for chills and fever.  HENT: Negative for congestion and tinnitus.   Eyes: Negative for blurred vision and double vision.  Respiratory: Positive for cough, shortness of breath and wheezing.   Cardiovascular: Negative for chest pain, orthopnea and PND.  Gastrointestinal: Negative for abdominal pain, diarrhea, nausea and vomiting.  Genitourinary: Negative for dysuria and hematuria.  Neurological: Negative for dizziness, sensory change and focal weakness.  All other systems reviewed and are negative.   Nutrition: Heart Healthy Tolerating Diet: Yes Tolerating PT: Eval noted.    DRUG ALLERGIES:   Allergies  Allergen Reactions  . Ativan [Lorazepam] Other (See Comments)    "out of mind"  . Atorvastatin Other (See Comments)    Joint soreness  . Fluticasone Furoate-Vilanterol Swelling    Tongue swelling  . Haloperidol Decanoate   . Tiotropium Swelling    Per patient his tongue swells up   . Sulfamethoxazole-Trimethoprim Itching    Reports itching    VITALS:  Blood pressure (!) 120/93, pulse 68, temperature 98.9 F (37.2 C), resp. rate 19, height 6\' 3"  (1.905 m), weight (!) 159.7 kg, SpO2 97 %.  PHYSICAL EXAMINATION:   Physical Exam  GENERAL:  63 y.o.-year-old patient lying in bed in no acute distress.  EYES: Pupils equal, round, reactive to light and accommodation. No scleral icterus. Extraocular muscles intact.  HEENT: Head atraumatic, normocephalic. Oropharynx and nasopharynx clear.  NECK:  Supple, no jugular venous distention. No thyroid enlargement, no tenderness.  LUNGS: Good air entry bilaterally, diffuse  expiratory wheezing, occasional rhonchi, negative use accessory muscles. CARDIOVASCULAR: S1, S2 normal. No murmurs, rubs, or gallops.  ABDOMEN: Soft, nontender, nondistended. Bowel sounds present. No organomegaly or mass.  EXTREMITIES: No cyanosis, clubbing or edema b/l.    NEUROLOGIC: Cranial nerves II through XII are intact. No focal Motor or sensory deficits b/l.   PSYCHIATRIC: The patient is alert and oriented x 3.  SKIN: No obvious rash, lesion, or ulcer.    LABORATORY PANEL:   CBC Recent Labs  Lab 09/08/18 1506  WBC 9.4  HGB 13.0  HCT 39.4  PLT 210   ------------------------------------------------------------------------------------------------------------------  Chemistries  Recent Labs  Lab 09/08/18 1506  NA 136  K 3.3*  CL 103  CO2 26  GLUCOSE 192*  BUN 21*  CREATININE 1.26*  CALCIUM 8.2*   ------------------------------------------------------------------------------------------------------------------  Cardiac Enzymes Recent Labs  Lab 09/08/18 1506  TROPONINI 0.03*   ------------------------------------------------------------------------------------------------------------------  RADIOLOGY:  Dg Chest 2 View  Result Date: 09/08/2018 CLINICAL DATA:  Shortness of breath EXAM: CHEST - 2 VIEW COMPARISON:  09/02/2018 FINDINGS: There is mild bilateral chronic interstitial thickening. There is no focal consolidation. There is no pleural effusion or pneumothorax. There is stable cardiomegaly. There is evidence of prior CABG. The osseous structures are unremarkable. IMPRESSION: 1. Bilateral chronic interstitial lung disease. 2. Cardiomegaly. Electronically Signed   By: Elige Ko   On: 09/08/2018 15:45   Ct Angio Chest Pe W And/or Wo Contrast  Result Date: 09/08/2018 CLINICAL DATA:  Cough, COPD, midsternal chest pain EXAM: CT ANGIOGRAPHY CHEST WITH CONTRAST TECHNIQUE: Multidetector CT imaging of the chest was performed  [employer]Donald[employer] CorporatCheyenne AdBedford Va Medical CenterasChinaonny FlurryGraciela HusbandsarElmer BalestNarda BondseDebria GarretHu46ghNorma FredricksMarland Kitchenons [phone] ADTEXTTAG>eCheyennK(651) eDionVeda Can eda Can K407 >G>XTTAG>DTEXTTAG>DTEXTTAG>83mNarda BondsFonnie MuGraciela HusbandsElmer Baleshevy Chase Endoscopy Centerheyenne AdasMcDonald's CorporationTomie ChinaRonny Flurry [phone] KitchenNorma Fredrickson58Kirt BoysDebria GarretEnedina FinnerGearldine BienenstockGeorge Hugh Veda CanningDione BoozeLucent TechnologiesMaurine MinisterShelton SilvasMercy Regional Medical CenterSaint MartinLonie PeakCarnegiePatsy [phone] Basque [phone] PlumberTresea MallThurmon FairFlorence Surgery Center LP79Luevenia MaxinDownsvilleVaughan BastaCaralee AtesWashingtonMedical Center Of Trinity18Leslee [id number] LipaGeisinger Shamokin Area Community HospitalVladimir FasterDustin Flock 29mNarda BondsFonnie MuGraciela HusbandsElmer BalesGulf Coast Outpatient Surgery Center LLC Dba Gulf Coast Outpatient Surgery CenterCheyenne Adas

## 2018-09-09 NOTE — Progress Notes (Signed)
Respiratory Protocol Assessment performed on patient. Patient has declined 3 out of 5 scheduled breathing treatments. Patient is on RA SAT 96%. Per Assessment patient scored a level 2, treatment frequency changed from Q6 to TID. Patient has Q4 PRN treatments ordered as well if needed. Will continue to monitor.

## 2018-09-09 NOTE — Evaluation (Signed)
Physical Therapy Evaluation Patient Details Name: [name] Sr. MRN: [id number] DOB: 01/03/1956 Today's Date: 09/09/2018   History of Present Illness  Pt is a 63 year old male admitted for COPD exacerbation, cleared for PE and + for flu.  PMH includes CABG, CHF, COPD, chronic hip/back pain.  Clinical Impression  Pt in bed and reporting no pain or weakness upon PT arrival. Oriented to self and situation and slightly agitated.  Pt independent with bed mobility, transfers and ambulation (200 ft without AD).  Pt did not present with static or dynamic balance deficits and presented with good strength overall.  Pt does not need further PT services at this time.  Will discontinue order.  Please consult if a need arises in the future.    Follow Up Recommendations No PT follow up    Equipment Recommendations  None recommended by PT    Recommendations for Other Services       Precautions / Restrictions Restrictions Weight Bearing Restrictions: No      Mobility  Bed Mobility Overal bed mobility: Independent                Transfers Overall transfer level: Independent                  Ambulation/Gait Ambulation/Gait assistance: Independent Gait Distance (Feet): 200 Feet       Gait velocity interpretation: >4.37 ft/sec, indicative of normal walking speed    Stairs            Wheelchair Mobility    Modified Rankin (Stroke Patients Only)       Balance Overall balance assessment: Independent                                           Pertinent Vitals/Pain Pain Assessment: No/denies pain    Home Living Family/patient expects to be discharged to:: Private residence Living Arrangements: Alone Available Help at Discharge: Family Type of Home: House                Prior Function Level of Independence: Independent         Comments: Pt stated that he is independent with all mobility has all home modifications needed from  prior surgeries.     Hand Dominance        Extremity/Trunk Assessment   Upper Extremity Assessment Upper Extremity Assessment: Overall WFL for tasks assessed(5/5)    Lower Extremity Assessment Lower Extremity Assessment: Overall WFL for tasks assessed(5/5)    Cervical / Trunk Assessment Cervical / Trunk Assessment: Normal  Communication   Communication: HOH  Cognition Arousal/Alertness: Awake/alert Behavior During Therapy: WFL for tasks assessed/performed;Restless Overall Cognitive Status: Within Functional Limits for tasks assessed                                        General Comments      Exercises     Assessment/Plan    PT Assessment Patent does not need any further PT services  PT Problem List         PT Treatment Interventions      PT Goals (Current goals can be found in the Care Plan section)  Acute Rehab PT Goals PT Goal Formulation: All assessment and education complete, DC therapy    Frequency  Barriers to discharge        Co-evaluation               AM-PAC PT "6 Clicks" Mobility  Outcome Measure Help needed turning from your back to your side while in a flat bed without using bedrails?: None Help needed moving from lying on your back to sitting on the side of a flat bed without using bedrails?: None Help needed moving to and from a bed to a chair (including a wheelchair)?: None Help needed standing up from a chair using your arms (e.g., wheelchair or bedside chair)?: None Help needed to walk in hospital room?: None Help needed climbing 3-5 steps with a railing? : None 6 Click Score: 24    End of Session Equipment Utilized During Treatment: Gait belt Activity Tolerance: Patient tolerated treatment well Patient left: in chair   PT Visit Diagnosis: Muscle weakness (generalized) (M62.81)    Time: 7824-2353 PT Time Calculation (min) (ACUTE ONLY): 17 min   Charges:   PT Evaluation $PT Eval Low Complexity: 1  Low          Glenetta Hew, PT, DPT   Glenetta Hew 09/09/2018, 9:44 AM

## 2018-09-10 DIAGNOSIS — J101 Influenza due to other identified influenza virus with other respiratory manifestations: Secondary | ICD-10-CM | POA: Diagnosis not present

## 2018-09-10 DIAGNOSIS — J441 Chronic obstructive pulmonary disease with (acute) exacerbation: Secondary | ICD-10-CM | POA: Diagnosis not present

## 2018-09-10 LAB — BASIC METABOLIC PANEL
Anion gap: 5 (ref 5–15)
BUN: 22 mg/dL — AB (ref 6–20)
CO2: 27 mmol/L (ref 22–32)
Calcium: 8.3 mg/dL — ABNORMAL LOW (ref 8.9–10.3)
Chloride: 105 mmol/L (ref 98–111)
Creatinine, Ser: 0.99 mg/dL (ref 0.61–1.24)
GFR calc Af Amer: >60 mL/min (ref >60)
GFR calc non Af Amer: >60 mL/min (ref >60)
Glucose, Bld: 166 mg/dL — ABNORMAL HIGH (ref 70–99)
Potassium: 4.4 mmol/L (ref 3.5–5.1)
Sodium: 137 mmol/L (ref 135–145)

## 2018-09-10 MED ORDER — PREDNISONE 10 MG PO TABS: ORAL_TABLET | ORAL | 0 refills | Status: DC

## 2018-09-10 MED ORDER — OSELTAMIVIR PHOSPHATE 75 MG PO CAPS: 75.0000 mg | ORAL_CAPSULE | Freq: Two times a day (BID) | ORAL | 0 refills | Status: AC

## 2018-09-10 MED ORDER — KETOROLAC TROMETHAMINE 30 MG/ML IJ SOLN
INTRAMUSCULAR | Status: AC
  Filled 2018-09-10: qty 1

## 2018-09-10 NOTE — Progress Notes (Signed)
Pt has been discharged home.  AVS given and explained to pt.  Pt verbalized understanding.  Meds and f/u appointments reviewed.  Rx sent to pharmacy.  Pt made aware.

## 2018-09-10 NOTE — Discharge Summary (Signed)
[facility] [place] at [facility]   PATIENT NAME: [name]    MR#:  [id number]  DATE OF BIRTH:  [birth year]  DATE OF ADMISSION:  09/08/2018 ADMITTING PHYSICIAN: Ramonita LabAruna Gouru, MD  DATE OF DISCHARGE: 09/10/2018 11:48 AM  PRIMARY CARE PHYSICIAN: Bubba Campohen, Cerrone Akil, MD    ADMISSION DIAGNOSIS:  Influenza B [J10.1] COPD exacerbation (HCC) [J44.1]  DISCHARGE DIAGNOSIS:  Active Problems:   COPD with acute exacerbation (HCC)   SECONDARY DIAGNOSIS:   Past Medical History:  Diagnosis Date  . Anxiety disorder   . Arthritis   . CAD (coronary artery disease)    a. ~ 2000 cath at [facility];  b. 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  . CHF (congestive heart failure) (HCC)   . Chronic back pain   . Chronic hip pain   . COPD (chronic obstructive pulmonary disease) (HCC)   . Depression   . Essential hypertension, benign   . GERD (gastroesophageal reflux disease)   . Headache(784.0)   . History of alcohol abuse   . History of cocaine abuse (HCC)   . Hyperlipidemia   . Obesity   . Pneumonia     HOSPITAL COURSE:   63 year old male with past medical history of hypertension, obesity, hyperlipidemia, alcohol abuse, history of cocaine abuse, COPD, chronic back pain, CHF, coronary artery disease, anxiety who presented to the hospital due to shortness of breath cough and noted to be in COPD exacerbation.  1.  COPD exacerbation-secondary to the flu.  Patient was positive for influenza B. -Patient was treated with IV steroids, Pulmicort nebs, scheduled duo nebs.  He was also given Tamiflu. -He has clinically improved.  He is no longer hypoxic, has less wheezing and bronchospasm.  He is now being discharged on oral prednisone taper and finishing his course of Tamiflu. -She will continue his maintenance inhalers including Symbicort,  albuterol inhaler as needed.  2.  Flu-patient was positive for influenza B. -Much improved.  Patient will finish his course of Tamiflu.  Continue supportive care.  3.  Essential hypertension- he will continue carvedilol, Imdur.    4.  Chronic pain-he will continue his  oxycodone  5.  Hypokalemia- Improved with supplementation and can be further followed as outpatient.   6.  Anxiety/depression- pt. Will continue Wellbutrin.  7.  GERD- pt. Will cont. His Omeprazole  8. Hyperthyroidism - he will continue his methimazole  DISCHARGE CONDITIONS:   Stable  CONSULTS OBTAINED:    DRUG ALLERGIES:   Allergies  Allergen Reactions  . Ativan [Lorazepam] Other (See Comments)    "out of mind"  . Atorvastatin Other (See Comments)    Joint soreness  . Fluticasone Furoate-Vilanterol Swelling    Tongue swelling  . Haloperidol Decanoate   . Tiotropium Swelling    Per patient his tongue swells up   . Sulfamethoxazole-Trimethoprim Itching    Reports itching    DISCHARGE MEDICATIONS:   Allergies as of 09/10/2018      Reactions   Ativan [lorazepam] Other (See Comments)   "out of mind"   Atorvastatin Other (See Comments)   Joint soreness   Fluticasone Furoate-vilanterol Swelling   Tongue swelling   Haloperidol Decanoate    Tiotropium Swelling   Per patient his tongue swells up    Sulfamethoxazole-trimethoprim Itching   Reports itching  Medication List    TAKE these medications   aspirin 81 MG tablet Take 1 tablet (81 mg total) by mouth daily.   budesonide-formoterol 160-4.5 MCG/ACT inhaler Commonly known as:  SYMBICORT Inhale 2 puffs into the lungs 2 (two) times daily.   buPROPion 150 MG 24 hr tablet Commonly known as:  WELLBUTRIN XL Take 150 mg by mouth daily.   carvedilol 3.125 MG tablet Commonly known as:  COREG Take 1 tablet by mouth 2 (two) times daily.   [facility] MELATONIN 3 MG Tabs Generic drug:  Melatonin Take 1 tablet by mouth at bedtime as needed.    furosemide 20 MG tablet Commonly known as:  LASIX Take 40 mg by mouth every morning.   isosorbide mononitrate 60 MG 24 hr tablet Commonly known as:  IMDUR Take 60 mg by mouth daily.   methimazole 10 MG tablet Commonly known as:  TAPAZOLE Take 20 mg by mouth daily.   nitroGLYCERIN 0.4 MG SL tablet Commonly known as:  NITROSTAT Place 0.4 mg under the tongue every 5 (five) minutes as needed. For chest pain   omeprazole 20 MG capsule Commonly known as:  PRILOSEC Take 20 mg by mouth every morning.   oseltamivir 75 MG capsule Commonly known as:  TAMIFLU Take 1 capsule (75 mg total) by mouth 2 (two) times daily for 4 days.   oxyCODONE-acetaminophen 10-325 MG tablet Commonly known as:  PERCOCET Take 1 tablet by mouth every 4 (four) hours as needed for pain.   PATADAY 0.2 % Soln Generic drug:  Olopatadine HCl Apply 2 drops to eye 2 (two) times daily.   predniSONE 10 MG tablet Commonly known as:  DELTASONE Label  & dispense according to the schedule below. 5 Pills PO for 1 day then, 4 Pills PO for 1 day, 3 Pills PO for 1 day, 2 Pills PO for 1 day, 1 Pill PO for 1 days then STOP. What changed:    medication strength  how much to take  how to take this  when to take this  additional instructions   albuterol (2.5 MG/3ML) 0.083% nebulizer solution Commonly known as:  PROVENTIL Take 2.5 mg by nebulization every 6 (six) hours as needed for wheezing.   PROAIR HFA 108 (90 Base) MCG/ACT inhaler Generic drug:  albuterol Inhale 2 puffs into the lungs daily as needed for wheezing or shortness of breath.   traZODone 50 MG tablet Commonly known as:  DESYREL Take 1-2 tablets by mouth at bedtime.         DISCHARGE INSTRUCTIONS:   DIET:  Cardiac diet  DISCHARGE CONDITION:  Stable  ACTIVITY:  Activity as tolerated  OXYGEN:  Home Oxygen: No.   Oxygen Delivery: room air  DISCHARGE LOCATION:  home   If you experience worsening of your admission symptoms, develop  shortness of breath, life threatening emergency, suicidal or homicidal thoughts you must seek medical attention immediately by calling 911 or calling your MD immediately  if symptoms less severe.  You Must read complete instructions/literature along with all the possible adverse reactions/side effects for all the Medicines you take and that have been prescribed to you. Take any new Medicines after you have completely understood and accpet all the possible adverse reactions/side effects.   Please note  You were cared for by a hospitalist during your hospital stay. If you have any questions about your discharge medications or the care you received while you were in the hospital after you are discharged, you can call the unit and  asked to speak with the hospitalist on call if the hospitalist that took care of you is not available. Once you are discharged, your primary care physician will handle any further medical issues. Please note that NO REFILLS for any discharge medications will be authorized once you are discharged, as it is imperative that you return to your primary care physician (or establish a relationship with a primary care physician if you do not have one) for your aftercare needs so that they can reassess your need for medications and monitor your lab values.     Today   No acute events overnight.  Sitting up in chair and denies any further wheezing or bronchospasm.  Will discharge home today.  VITAL SIGNS:  Blood pressure 138/87, pulse 73, temperature 97.7 F (36.5 C), temperature source Oral, resp. rate (!) 22, height 6\' 3"  (1.905 m), weight (!) 159.7 kg, SpO2 96 %.  I/O:    Intake/Output Summary (Last 24 hours) at 09/10/2018 1254 Last data filed at 09/10/2018 1057 Gross per 24 hour  Intake 240 ml  Output 2350 ml  Net -2110 ml    PHYSICAL EXAMINATION:   GENERAL:  63 y.o.-year-old patient lying in bed in no acute distress.  EYES: Pupils equal, round, reactive to light and  accommodation. No scleral icterus. Extraocular muscles intact.  HEENT: Head atraumatic, normocephalic. Oropharynx and nasopharynx clear.  NECK:  Supple, no jugular venous distention. No thyroid enlargement, no tenderness.  LUNGS: Good air entry bilaterally, no wheezing, rhonchi, rales negative use accessory muscles. CARDIOVASCULAR: S1, S2 normal. No murmurs, rubs, or gallops.  ABDOMEN: Soft, nontender, nondistended. Bowel sounds present. No organomegaly or mass.  EXTREMITIES: No cyanosis, clubbing or edema b/l.    NEUROLOGIC: Cranial nerves II through XII are intact. No focal Motor or sensory deficits b/l.   PSYCHIATRIC: The patient is alert and oriented x 3.  SKIN: No obvious rash, lesion, or ulcer.   DATA REVIEW:   CBC Recent Labs  Lab 09/08/18 1506  WBC 9.4  HGB 13.0  HCT 39.4  PLT 210    Chemistries  Recent Labs  Lab 09/10/18 0432  NA 137  K 4.4  CL 105  CO2 27  GLUCOSE 166*  BUN 22*  CREATININE 0.99  CALCIUM 8.3*    Cardiac Enzymes Recent Labs  Lab 09/08/18 1506  TROPONINI 0.03*    Microbiology Results  Results for orders placed or performed during the hospital encounter of 03/29/18  MRSA PCR Screening     Status: None   Collection Time: 03/30/18 12:27 AM  Result Value Ref Range Status   MRSA by PCR NEGATIVE NEGATIVE Final    Comment:        The GeneXpert MRSA Assay (FDA approved for NASAL specimens only), is one component of a comprehensive MRSA colonization surveillance program. It is not intended to diagnose MRSA infection nor to guide or monitor treatment for MRSA infections. Performed at [facility], [address] [place] [place], [place] [address]     RADIOLOGY:  Dg Chest 2 View  Result Date: 09/08/2018 CLINICAL DATA:  Shortness of breath EXAM: CHEST - 2 VIEW COMPARISON:  09/02/2018 FINDINGS: There is mild bilateral chronic interstitial thickening. There is no focal consolidation. There is no pleural effusion or pneumothorax. There is  stable cardiomegaly. There is evidence of prior CABG. The osseous structures are unremarkable. IMPRESSION: 1. Bilateral chronic interstitial lung disease. 2. Cardiomegaly. Electronically Signed   By: Elige Ko   On: 09/08/2018 15:45   Ct  Angio Chest Pe W And/or Wo Contrast  Result Date: 09/08/2018 CLINICAL DATA:  Cough, COPD, midsternal chest pain EXAM: CT ANGIOGRAPHY CHEST WITH CONTRAST TECHNIQUE: Multidetector CT imaging of the chest was performed using the standard protocol during bolus administration of intravenous contrast. Multiplanar CT image reconstructions and MIPs were obtained to evaluate the vascular anatomy. CONTRAST:  35mL OMNIPAQUE IOHEXOL 350 MG/ML SOLN COMPARISON:  None. FINDINGS: Cardiovascular: Satisfactory opacification of the pulmonary arteries to the segmental level. No evidence of pulmonary embolism. Stable cardiomegaly. No pericardial effusion. Prior CABG. Mediastinum/Nodes: No enlarged mediastinal, hilar, or axillary lymph nodes. Thyroid gland, trachea, and esophagus demonstrate no significant findings. Lungs/Pleura: Lungs are clear. No pleural effusion or pneumothorax. Upper Abdomen: No acute abnormality. Musculoskeletal: No chest wall abnormality. No acute or significant osseous findings. Review of the MIP images confirms the above findings. IMPRESSION: 1. No evidence of pulmonary embolus. 2. No acute cardiopulmonary disease. Electronically Signed   By: Elige Ko   On: 09/08/2018 16:39      Management plans discussed with the patient, family and they are in agreement.  CODE STATUS:  Code Status History    Date Active Date Inactive Code Status Order ID Comments User Context   07/26/2018 0522 07/26/2018 2045 Full Code [id number]  Oralia Manis, MD ED   TOTAL TIME TAKING CARE OF THIS PATIENT: 40 minutes.    Houston Siren M.D on 09/10/2018 at 12:54 PM  Between 7am to 6pm - Pager - [phone]  After 6pm go to [email or web address] - Social research officer, government  [facility]  [facility]  Office  [phone]  CC: Primary care physician; Bubba Camp, MD

## 2018-09-11 LAB — HIV ANTIBODY (ROUTINE TESTING W REFLEX): HIV Screen 4th Generation wRfx: NONREACTIVE

## 2018-09-12 ENCOUNTER — Telehealth: Payer: Self-pay

## 2018-09-12 NOTE — Telephone Encounter (Signed)
EMMI Follow-up: [name] had received several calls from my number and calling to see why he had received the calls. I explained our process of two automated calls post discharge to see how he was doing.  Said everything was going well and no needs noted.  Appreciated everything everyone had done for him during his stay. Great Job!

## 2018-09-15 ENCOUNTER — Telehealth: Payer: Self-pay

## 2018-09-15 NOTE — Telephone Encounter (Signed)
EMMI Follow-up: [name] called as he had received several automated calls from my number and checking to see why we had called again.  I explained he had received the second automated call and we were just checking to see how things were going post discharge.  [name] he was still wheezing a lot but had a follow-up appointment scheduled for next Tuesday at 12:20 pm.  No needs noted for today.

## 2018-10-22 ENCOUNTER — Emergency Department: Payer: Medicare Other

## 2018-10-22 ENCOUNTER — Inpatient Hospital Stay
Admission: EM | Admit: 2018-10-22 | Discharge: 2018-10-23 | DRG: 190 | Discharge status: Against Medical Advice | Payer: Medicare Other | Attending: Internal Medicine | Admitting: Internal Medicine

## 2018-10-22 ENCOUNTER — Other Ambulatory Visit: Payer: Self-pay

## 2018-10-22 DIAGNOSIS — Z79899 Other long term (current) drug therapy: Secondary | ICD-10-CM | POA: Diagnosis not present

## 2018-10-22 DIAGNOSIS — G8929 Other chronic pain: Secondary | ICD-10-CM | POA: Diagnosis present

## 2018-10-22 DIAGNOSIS — Z87891 Personal history of nicotine dependence: Secondary | ICD-10-CM | POA: Diagnosis not present

## 2018-10-22 DIAGNOSIS — I110 Hypertensive heart disease with heart failure: Secondary | ICD-10-CM | POA: Diagnosis present

## 2018-10-22 DIAGNOSIS — J440 Chronic obstructive pulmonary disease with acute lower respiratory infection: Secondary | ICD-10-CM | POA: Diagnosis present

## 2018-10-22 DIAGNOSIS — J209 Acute bronchitis, unspecified: Secondary | ICD-10-CM | POA: Diagnosis present

## 2018-10-22 DIAGNOSIS — Z6841 Body Mass Index (BMI) 40.0 and over, adult: Secondary | ICD-10-CM

## 2018-10-22 DIAGNOSIS — Z7951 Long term (current) use of inhaled steroids: Secondary | ICD-10-CM | POA: Diagnosis not present

## 2018-10-22 DIAGNOSIS — I252 Old myocardial infarction: Secondary | ICD-10-CM

## 2018-10-22 DIAGNOSIS — E785 Hyperlipidemia, unspecified: Secondary | ICD-10-CM | POA: Diagnosis present

## 2018-10-22 DIAGNOSIS — I5033 Acute on chronic diastolic (congestive) heart failure: Secondary | ICD-10-CM | POA: Diagnosis present

## 2018-10-22 DIAGNOSIS — J441 Chronic obstructive pulmonary disease with (acute) exacerbation: Secondary | ICD-10-CM

## 2018-10-22 DIAGNOSIS — I2510 Atherosclerotic heart disease of native coronary artery without angina pectoris: Secondary | ICD-10-CM | POA: Diagnosis present

## 2018-10-22 DIAGNOSIS — J9690 Respiratory failure, unspecified, unspecified whether with hypoxia or hypercapnia: Secondary | ICD-10-CM | POA: Diagnosis present

## 2018-10-22 DIAGNOSIS — Z7982 Long term (current) use of aspirin: Secondary | ICD-10-CM | POA: Diagnosis not present

## 2018-10-22 DIAGNOSIS — Z8249 Family history of ischemic heart disease and other diseases of the circulatory system: Secondary | ICD-10-CM | POA: Diagnosis not present

## 2018-10-22 DIAGNOSIS — K219 Gastro-esophageal reflux disease without esophagitis: Secondary | ICD-10-CM | POA: Diagnosis present

## 2018-10-22 DIAGNOSIS — R079 Chest pain, unspecified: Secondary | ICD-10-CM

## 2018-10-22 DIAGNOSIS — Z951 Presence of aortocoronary bypass graft: Secondary | ICD-10-CM | POA: Diagnosis not present

## 2018-10-22 DIAGNOSIS — I509 Heart failure, unspecified: Secondary | ICD-10-CM

## 2018-10-22 DIAGNOSIS — E0590 Thyrotoxicosis, unspecified without thyrotoxic crisis or storm: Secondary | ICD-10-CM | POA: Diagnosis present

## 2018-10-22 LAB — BASIC METABOLIC PANEL
Anion gap: 9 (ref 5–15)
BUN: 22 mg/dL — ABNORMAL HIGH (ref 6–20)
CO2: 23 mmol/L (ref 22–32)
Calcium: 9 mg/dL (ref 8.9–10.3)
Chloride: 108 mmol/L (ref 98–111)
Creatinine, Ser: 1.19 mg/dL (ref 0.61–1.24)
GFR calc Af Amer: >60 mL/min (ref >60)
Glucose, Bld: 121 mg/dL — ABNORMAL HIGH (ref 70–99)
Potassium: 3.5 mmol/L (ref 3.5–5.1)
SODIUM: 140 mmol/L (ref 135–145)

## 2018-10-22 LAB — CBC
HCT: 40.2 % (ref 39.0–52.0)
Hemoglobin: 13.6 g/dL (ref 13.0–17.0)
MCH: 31.6 pg (ref 26.0–34.0)
MCHC: 33.8 g/dL (ref 30.0–36.0)
MCV: 93.3 fL (ref 80.0–100.0)
Platelets: 229 10*3/uL (ref 150–400)
RBC: 4.31 MIL/uL (ref 4.22–5.81)
RDW: 13.7 % (ref 11.5–15.5)
WBC: 8.6 10*3/uL (ref 4.0–10.5)
nRBC: 0 % (ref 0.0–0.2)

## 2018-10-22 LAB — GLUCOSE, CAPILLARY: Glucose-Capillary: 124 mg/dL — ABNORMAL HIGH (ref 70–99)

## 2018-10-22 LAB — PROTIME-INR
INR: 1 (ref 0.8–1.2)
Prothrombin Time: 12.9 seconds (ref 11.4–15.2)

## 2018-10-22 LAB — BRAIN NATRIURETIC PEPTIDE: B Natriuretic Peptide: 30 pg/mL (ref 0.0–100.0)

## 2018-10-22 LAB — INFLUENZA PANEL BY PCR (TYPE A & B)
Influenza A By PCR: NEGATIVE
Influenza B By PCR: NEGATIVE

## 2018-10-22 LAB — TROPONIN I: Troponin I: <0.03 ng/mL (ref <0.03)

## 2018-10-22 MED ORDER — FUROSEMIDE 10 MG/ML IJ SOLN
40.0000 mg | Freq: Two times a day (BID) | INTRAMUSCULAR | Status: DC
  Administered 2018-10-22 – 2018-10-23 (×2): 40 mg via INTRAVENOUS
  Filled 2018-10-22 (×2): qty 4

## 2018-10-22 MED ORDER — FUROSEMIDE 10 MG/ML IJ SOLN
60.0000 mg | Freq: Once | INTRAMUSCULAR | Status: AC
  Administered 2018-10-22: 60 mg via INTRAVENOUS
  Filled 2018-10-22: qty 8

## 2018-10-22 MED ORDER — ACETAMINOPHEN 325 MG PO TABS: 650.0000 mg | ORAL_TABLET | Freq: Four times a day (QID) | ORAL | Status: DC | PRN

## 2018-10-22 MED ORDER — SODIUM CHLORIDE 0.9 % IV SOLN: 250.0000 mL | INTRAVENOUS | Status: DC | PRN

## 2018-10-22 MED ORDER — ONDANSETRON HCL 4 MG/2ML IJ SOLN: 4.0000 mg | Freq: Four times a day (QID) | INTRAMUSCULAR | Status: DC | PRN

## 2018-10-22 MED ORDER — OSELTAMIVIR PHOSPHATE 75 MG PO CAPS
75.0000 mg | ORAL_CAPSULE | Freq: Two times a day (BID) | ORAL | Status: DC
  Administered 2018-10-23: 75 mg via ORAL
  Filled 2018-10-22: qty 1

## 2018-10-22 MED ORDER — ONDANSETRON HCL 4 MG PO TABS: 4.0000 mg | ORAL_TABLET | Freq: Four times a day (QID) | ORAL | Status: DC | PRN

## 2018-10-22 MED ORDER — ALBUTEROL SULFATE (2.5 MG/3ML) 0.083% IN NEBU: 2.5000 mg | INHALATION_SOLUTION | Freq: Four times a day (QID) | RESPIRATORY_TRACT | Status: DC | PRN

## 2018-10-22 MED ORDER — ISOSORBIDE MONONITRATE ER 60 MG PO TB24
60.0000 mg | ORAL_TABLET | Freq: Every day | ORAL | Status: DC
  Administered 2018-10-23: 60 mg via ORAL
  Filled 2018-10-22: qty 1

## 2018-10-22 MED ORDER — IPRATROPIUM-ALBUTEROL 0.5-2.5 (3) MG/3ML IN SOLN
3.0000 mL | RESPIRATORY_TRACT | Status: DC
  Administered 2018-10-22: 3 mL via RESPIRATORY_TRACT
  Filled 2018-10-22 (×3): qty 3

## 2018-10-22 MED ORDER — SODIUM CHLORIDE 0.9% FLUSH: 3.0000 mL | INTRAVENOUS | Status: DC | PRN

## 2018-10-22 MED ORDER — ACETAMINOPHEN 650 MG RE SUPP: 650.0000 mg | Freq: Four times a day (QID) | RECTAL | Status: DC | PRN

## 2018-10-22 MED ORDER — NITROGLYCERIN 2 % TD OINT
1.0000 [in_us] | TOPICAL_OINTMENT | Freq: Once | TRANSDERMAL | Status: AC
  Administered 2018-10-22: 1 [in_us] via TOPICAL
  Filled 2018-10-22: qty 1

## 2018-10-22 MED ORDER — IPRATROPIUM-ALBUTEROL 0.5-2.5 (3) MG/3ML IN SOLN
3.0000 mL | Freq: Once | RESPIRATORY_TRACT | Status: AC
  Administered 2018-10-22: 3 mL via RESPIRATORY_TRACT
  Filled 2018-10-22: qty 3

## 2018-10-22 MED ORDER — AZITHROMYCIN 250 MG PO TABS
500.0000 mg | ORAL_TABLET | Freq: Every day | ORAL | Status: DC
  Administered 2018-10-22 – 2018-10-23 (×2): 500 mg via ORAL
  Filled 2018-10-22 (×2): qty 2

## 2018-10-22 MED ORDER — SODIUM CHLORIDE 0.9% FLUSH
3.0000 mL | Freq: Two times a day (BID) | INTRAVENOUS | Status: DC
  Administered 2018-10-22 – 2018-10-23 (×2): 3 mL via INTRAVENOUS

## 2018-10-22 MED ORDER — OXYCODONE-ACETAMINOPHEN 5-325 MG PO TABS
2.0000 | ORAL_TABLET | ORAL | Status: DC | PRN
  Administered 2018-10-23 (×3): 2 via ORAL
  Filled 2018-10-22 (×3): qty 2

## 2018-10-22 MED ORDER — BUPROPION HCL ER (XL) 150 MG PO TB24
150.0000 mg | ORAL_TABLET | Freq: Every day | ORAL | Status: DC
  Administered 2018-10-23: 150 mg via ORAL
  Filled 2018-10-22: qty 1

## 2018-10-22 MED ORDER — METHYLPREDNISOLONE SODIUM SUCC 125 MG IJ SOLR
60.0000 mg | INTRAMUSCULAR | Status: DC
  Administered 2018-10-22: 60 mg via INTRAVENOUS
  Filled 2018-10-22: qty 2

## 2018-10-22 MED ORDER — INSULIN ASPART 100 UNIT/ML ~~LOC~~ SOLN
0.0000 [IU] | Freq: Three times a day (TID) | SUBCUTANEOUS | Status: DC
  Administered 2018-10-23: 2 [IU] via SUBCUTANEOUS
  Filled 2018-10-22: qty 1

## 2018-10-22 MED ORDER — MELATONIN 5 MG PO TABS
5.0000 mg | ORAL_TABLET | Freq: Every day | ORAL | Status: DC
  Filled 2018-10-22 (×2): qty 1

## 2018-10-22 MED ORDER — OLOPATADINE HCL 0.1 % OP SOLN
1.0000 [drp] | Freq: Two times a day (BID) | OPHTHALMIC | Status: DC
  Filled 2018-10-22: qty 5

## 2018-10-22 MED ORDER — POLYETHYLENE GLYCOL 3350 17 G PO PACK: 17.0000 g | PACK | Freq: Every day | ORAL | Status: DC | PRN

## 2018-10-22 MED ORDER — METHYLPREDNISOLONE SODIUM SUCC 125 MG IJ SOLR
125.0000 mg | Freq: Once | INTRAMUSCULAR | Status: AC
  Administered 2018-10-22: 125 mg via INTRAVENOUS
  Filled 2018-10-22: qty 2

## 2018-10-22 MED ORDER — ASPIRIN EC 81 MG PO TBEC
81.0000 mg | DELAYED_RELEASE_TABLET | Freq: Every day | ORAL | Status: DC
  Administered 2018-10-23: 81 mg via ORAL
  Filled 2018-10-22: qty 1

## 2018-10-22 MED ORDER — PANTOPRAZOLE SODIUM 40 MG PO TBEC
40.0000 mg | DELAYED_RELEASE_TABLET | Freq: Every day | ORAL | Status: DC
  Administered 2018-10-23: 40 mg via ORAL
  Filled 2018-10-22: qty 1

## 2018-10-22 MED ORDER — MOMETASONE FURO-FORMOTEROL FUM 200-5 MCG/ACT IN AERO
2.0000 | INHALATION_SPRAY | Freq: Two times a day (BID) | RESPIRATORY_TRACT | Status: DC
  Filled 2018-10-22: qty 8.8

## 2018-10-22 MED ORDER — HEPARIN SODIUM (PORCINE) 5000 UNIT/ML IJ SOLN
5000.0000 [IU] | Freq: Three times a day (TID) | INTRAMUSCULAR | Status: DC
  Administered 2018-10-22 – 2018-10-23 (×2): 5000 [IU] via SUBCUTANEOUS
  Filled 2018-10-22 (×2): qty 1

## 2018-10-22 MED ORDER — OXYCODONE-ACETAMINOPHEN 5-325 MG PO TABS
2.0000 | ORAL_TABLET | Freq: Once | ORAL | Status: AC
  Administered 2018-10-22: 2 via ORAL
  Filled 2018-10-22: qty 2

## 2018-10-22 MED ORDER — METHIMAZOLE 10 MG PO TABS
20.0000 mg | ORAL_TABLET | Freq: Every day | ORAL | Status: DC
  Administered 2018-10-23: 20 mg via ORAL
  Filled 2018-10-22: qty 2

## 2018-10-22 MED ORDER — CARVEDILOL 3.125 MG PO TABS
3.1250 mg | ORAL_TABLET | Freq: Two times a day (BID) | ORAL | Status: DC
  Administered 2018-10-22 – 2018-10-23 (×2): 3.125 mg via ORAL
  Filled 2018-10-22 (×2): qty 1

## 2018-10-22 MED ORDER — TRAZODONE HCL 50 MG PO TABS
50.0000 mg | ORAL_TABLET | Freq: Every day | ORAL | Status: DC
  Administered 2018-10-22: 50 mg via ORAL
  Filled 2018-10-22: qty 1

## 2018-10-22 MED ORDER — ALBUTEROL SULFATE (2.5 MG/3ML) 0.083% IN NEBU: 3.0000 mL | INHALATION_SOLUTION | Freq: Every day | RESPIRATORY_TRACT | Status: DC | PRN

## 2018-10-22 MED ORDER — SODIUM CHLORIDE 0.9% FLUSH: 3.0000 mL | Freq: Once | INTRAVENOUS | Status: DC

## 2018-10-22 NOTE — H&P (Signed)
[facility] - [place] at [facility]   PATIENT NAME: [name]    MR#:  [id number]  DATE OF BIRTH:  March 10, 1956  DATE OF ADMISSION:  10/22/2018  PRIMARY CARE PHYSICIAN: Bubba Camp, MD   REQUESTING/REFERRING PHYSICIAN: dr Sharma Covert  CHIEF COMPLAINT:   SOB HISTORY OF PRESENT ILLNESS:  [name]  is a 63 y.o. male with a known history of coronary artery disease, chronic diastolic heart failure with preserved ejection fraction, COPD and chronic pain on narcotics who presents today to the emergency room due to shortness of breath.  Patient reports over the past 2 days he has had shortness of breath, PND, lower extremity edema, orthopnea and 2 to 4 pound weight gain.  Patient also endorses generalized weakness with wheezing and nonproductive cough.  Patient denies fever chills but has had some body aches.  PAST MEDICAL HISTORY:   Past Medical History:  Diagnosis Date  . Anxiety disorder   . Arthritis   . CAD (coronary artery disease)    a. ~ 2000 cath at [facility];  b. 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  . CHF (congestive heart failure) (HCC)   . Chronic back pain   . Chronic hip pain   . COPD (chronic obstructive pulmonary disease) (HCC)   . Depression   . Essential hypertension, benign   . GERD (gastroesophageal reflux disease)   . Headache(784.0)   . History of alcohol abuse   . History of cocaine abuse (HCC)   . Hyperlipidemia   . Obesity   . Pneumonia     PAST SURGICAL HISTORY:   Past Surgical History:  Procedure Laterality Date  . BACK SURGERY    . CHOLECYSTECTOMY    . CORONARY ARTERY BYPASS GRAFT    . HIP SURGERY     rt  . JOINT REPLACEMENT    . LEFT HEART CATHETERIZATION WITH CORONARY ANGIOGRAM N/A 09/12/2013   Procedure: LEFT HEART CATHETERIZATION WITH CORONARY ANGIOGRAM;  Surgeon: Kathleene Hazel, MD;  Location: [facility] CATH LAB;  Service: Cardiovascular;  Laterality: N/A;  . NASAL/SINUS ENDOSCOPY    . TOTAL HIP ARTHROPLASTY  05/08/2012   Procedure: TOTAL HIP ARTHROPLASTY;  Surgeon: Nestor Lewandowsky, MD;  Location: [facility] OR;  Service: Orthopedics;  Laterality: Left;  . TOTAL HIP ARTHROPLASTY  2014 per pt    SOCIAL HISTORY:   Social History   Tobacco Use  . Smoking status: Former Smoker    Packs/day: 0.50    Years: 39.00    Pack years: 19.50    Types: Cigarettes    Last attempt to quit: 09/15/2015    Years since quitting: 3.1  . Smokeless tobacco: Never Used  Substance Use Topics  . Alcohol use: Yes    Alcohol/week: 0.0 standard drinks    Comment: socially; reports drinking 2 beers last nigh    FAMILY HISTORY:   Family History  Problem Relation Age of Onset  . Heart attack Mother   . Heart attack Brother   . Heart attack Sister   . Heart attack Father     DRUG ALLERGIES:   Allergies  Allergen Reactions  . Ativan [Lorazepam] Other (See Comments)    "out of mind"  . Atorvastatin Other (See Comments)    Joint soreness  . Fluticasone Furoate-Vilanterol Swelling    Tongue swelling  .  Haloperidol Decanoate   . Tiotropium Swelling    Per patient his tongue swells up   . Sulfamethoxazole-Trimethoprim Itching    Reports itching    REVIEW OF SYSTEMS:   Review of Systems  Constitutional: Positive for malaise/fatigue. Negative for chills and fever.  HENT: Negative.  Negative for ear discharge, ear pain, hearing loss, nosebleeds and sore throat.   Eyes: Negative.  Negative for blurred vision and pain.  Respiratory: Positive for cough, shortness of breath and wheezing. Negative for hemoptysis.   Cardiovascular: Positive for chest pain, orthopnea, claudication, leg swelling and PND. Negative for palpitations.  Gastrointestinal: Negative.  Negative for abdominal pain, blood in stool, diarrhea, nausea and vomiting.  Genitourinary: Negative.  Negative for dysuria.   Musculoskeletal: Negative.  Negative for back pain.  Skin: Negative.   Neurological: Negative for dizziness, tremors, speech change, focal weakness, seizures and headaches.  Endo/Heme/Allergies: Negative.  Does not bruise/bleed easily.  Psychiatric/Behavioral: Negative.  Negative for depression, hallucinations and suicidal ideas.    MEDICATIONS AT HOME:   Prior to Admission medications   Medication Sig Start Date End Date Taking? Authorizing Provider  albuterol (PROVENTIL) (2.5 MG/3ML) 0.083% nebulizer solution Take 2.5 mg by nebulization every 6 (six) hours as needed for wheezing.   Yes [provider]  aspirin 81 MG tablet Take 1 tablet (81 mg total) by mouth daily. 05/31/12  Yes Kathlen , MD  budesonide-formoterol (SYMBICORT) 160-4.5 MCG/ACT inhaler Inhale 2 puffs into the lungs 2 (two) times daily.   Yes [provider]  buPROPion (WELLBUTRIN XL) 150 MG 24 hr tablet Take 150 mg by mouth daily. 07/15/18  Yes [provider]  carvedilol (COREG) 3.125 MG tablet Take 1 tablet by mouth 2 (two) times daily. 02/21/18  Yes [provider]  [facility] MELATONIN 3 MG TABS Take 1 tablet by mouth at bedtime.  03/22/18  Yes [provider]  furosemide (LASIX) 20 MG tablet Take 40 mg by mouth every morning.    Yes [provider]  isosorbide mononitrate (IMDUR) 60 MG 24 hr tablet Take 60 mg by mouth daily. 06/22/18  Yes [provider]  methimazole (TAPAZOLE) 10 MG tablet Take 20 mg by mouth daily. 07/21/18  Yes [provider]  nitroGLYCERIN (NITROSTAT) 0.4 MG SL tablet Place 0.4 mg under the tongue every 5 (five) minutes as needed. For chest pain   Yes [provider]  Olopatadine HCl (PATADAY) 0.2 % SOLN Apply 2 drops to eye 2 (two) times daily.   Yes [provider]  omeprazole (PRILOSEC) 20 MG capsule Take 20 mg by mouth every morning.    Yes [provider]  oxyCODONE-acetaminophen (PERCOCET) 10-325 MG  tablet Take 1 tablet by mouth every 4 (four) hours as needed for pain.   Yes [provider]  PROAIR HFA 108 (90 BASE) MCG/ACT inhaler Inhale 2 puffs into the lungs daily as needed for wheezing or shortness of breath.  06/20/13  Yes [provider]  traZODone (DESYREL) 50 MG tablet Take 1-2 tablets by mouth at bedtime. 03/22/18  Yes [provider]  predniSONE (DELTASONE) 10 MG tablet Label  & dispense according to the schedule below. 5 Pills PO for 1 day then, 4 Pills PO for 1 day, 3 Pills PO for 1 day, 2 Pills PO for 1 day, 1 Pill PO for 1 days then STOP. Patient not taking: Reported on 10/22/2018 09/10/18   Houston Siren, MD      VITAL SIGNS:  Blood pressure Marland Kitchen)  120/59, pulse 74, temperature 98.6 F (37 C), resp. rate 12, height 6\' 2"  (1.88 m), weight (!) 160.6 kg, SpO2 98 %.  PHYSICAL EXAMINATION:   Physical Exam Constitutional:      General: He is not in acute distress.    Comments: Obese  HENT:     Head: Normocephalic.  Eyes:     General: No scleral icterus. Neck:     Musculoskeletal: Normal range of motion and neck supple.     Vascular: No JVD.     Trachea: No tracheal deviation.  Cardiovascular:     Rate and Rhythm: Normal rate and regular rhythm.     Heart sounds: Normal heart sounds. No murmur. No friction rub. No gallop.   Pulmonary:     Effort: Pulmonary effort is normal. No respiratory distress.     Breath sounds: Wheezing present. No rales.  Chest:     Chest wall: No tenderness.  Abdominal:     General: Bowel sounds are normal. There is no distension.     Palpations: Abdomen is soft. There is no mass.     Tenderness: There is no abdominal tenderness. There is no guarding or rebound.  Musculoskeletal: Normal range of motion.     Right lower leg: Edema present.     Left lower leg: Edema present.  Skin:    General: Skin is warm.     Findings: No erythema or rash.  Neurological:     Mental Status: He is alert and oriented to person,  place, and time.  Psychiatric:        Judgment: Judgment normal.       LABORATORY PANEL:   CBC Recent Labs  Lab 10/22/18 1740  WBC 8.6  HGB 13.6  HCT 40.2  PLT 229   ------------------------------------------------------------------------------------------------------------------  Chemistries  Recent Labs  Lab 10/22/18 1740  NA 140  K 3.5  CL 108  CO2 23  GLUCOSE 121*  BUN 22*  CREATININE 1.19  CALCIUM 9.0   ------------------------------------------------------------------------------------------------------------------  Cardiac Enzymes Recent Labs  Lab 10/22/18 1740  TROPONINI <0.03   ------------------------------------------------------------------------------------------------------------------  RADIOLOGY:  Dg Chest 2 View  Result Date: 10/22/2018 CLINICAL DATA:  Former smoker.  Mid sternal chest pain. EXAM: CHEST - 2 VIEW COMPARISON:  09/08/2018 FINDINGS: Mild cardiac enlargement with aortic atherosclerosis. No pleural effusion. Pulmonary vascular congestion. No airspace opacities. Spondylosis identified within the thoracic spine. IMPRESSION: Cardiac enlargement and pulmonary vascular congestion. Electronically Signed   By: Signa Kell M.D.   On: 10/22/2018 18:38    EKG:   Normal sinus rhythm no ST elevation or depression IMPRESSION AND PLAN:   63 year old male with chronic diastolic heart failure and preserved ejection fraction, coronary artery disease and COPD who presents with shortness of breath.  1.  Acute on chronic diastolic heart failure: Start Lasix 40 IV twice daily Monitor intake and output with daily weight CHF clinic upon discharge Monitor creatinine and potassium  2.  Acute exacerbation of COPD, mild: Start Solu-Medrol 60 mg IV daily Continue nebs and inhalers Check influenza Erythromycin for acute bronchitis  3.  Chronic pain: Continue outpatient regimen  4.  CAD: Continue aspirin, Coreg, isosorbide  5.  Hyperthyroidism:  Continue Tapazole        All the records are reviewed and case discussed with ED provider. Management plans discussed with the patient and he is in agreement  CODE STATUS: FULL  TOTAL TIME TAKING CARE OF THIS PATIENT: 44 minutes.    Cerena Baine M.D on 10/22/2018 at  7:34 PM  Between 7am to 6pm - Pager - [phone]  After 6pm go to [email or web address] - Social research officer, government  [facility]  Office  [phone]  CC: Primary care physician; Bubba Camp, MD

## 2018-10-22 NOTE — ED Notes (Signed)
Pt reports mid to left chest pain x2 days with shortness of breath - pain radiates into back and left arm - pt states that it feels like his last heart attack - pt has extensive cardiac history - pt reports having to sleep on 4 pillows instead of 3 pillows for the last 2 days - noted to have pitting edema in bilat ext - c/o dizziness - Dr Sharma Covert was notified

## 2018-10-22 NOTE — ED Triage Notes (Addendum)
Pt comes via POV from home with c/o chest pain that started yesterday morning. Pt states  Mid sternal chest pain.  Pt has hx of several heart surgeries. Pt states several stents placed.   Pt states SOB that started yesterday. Pt states he feels weak. Pt states similar symptoms last time he had a MI.   Pt states they found it through CT scan and his bloodwork last time that he had a MI.  Pt states he took 3 nitro before coming to ED. Pt states it eased the pain a little. Pt states 7/10 pain that is sharp and pressure. Pt states his head feels like it is going to explode.

## 2018-10-22 NOTE — ED Notes (Addendum)
Pt reports dizziness and SHOB- pain started x2 days ago

## 2018-10-22 NOTE — ED Notes (Signed)
Pt taken to room ED 18. Pt hooked up to monitor at this time. RN Ariel at bedside and informed pt needed line and labs.

## 2018-10-22 NOTE — ED Provider Notes (Signed)
[facility] Emergency Department Provider Note  ____________________________________________  Time seen: Approximately 7:01 PM  I have reviewed the triage vital signs and the nursing notes.   HISTORY  Chief Complaint Chest Pain and Shortness of Breath    HPI [name] Sr. is a 63 y.o. male with a history of CAD status post multiple surgeries, CHF, COPD, morbid obesity, presenting for chest pain and shortness of breath.  The patient reports that since Wednesday last week, he has had a central constant chest pressure associated with exertional shortness of breath.  He has had a 2 pound weight gain.  He has been taking his 40 mg of Lasix daily.  He stopped taking his chronic pain medications "to be able to see if the chest pain got worse and it has."  Denies any cough or cold symptoms, lower extremity swelling, calf pain, lightheadedness or syncope, palpitations, fevers or chills.  He feels that nitroglycerin helps his pain.  Past Medical History:  Diagnosis Date  . Anxiety disorder   . Arthritis   . CAD (coronary artery disease)    a. ~ 2000 cath at [facility];  b. 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  . CHF (congestive heart failure) (HCC)   . Chronic back pain   . Chronic hip pain   . COPD (chronic obstructive pulmonary disease) (HCC)   . Depression   . Essential hypertension, benign   . GERD (gastroesophageal reflux disease)   . Headache(784.0)   . History of alcohol abuse   . History of cocaine abuse (HCC)   . Hyperlipidemia   . Obesity   . Pneumonia     Patient Active Problem List   Diagnosis Date Noted  . COPD with acute exacerbation (HCC) 09/08/2018  . Hyperthyroidism 07/26/2018  . NSTEMI (non-ST elevated myocardial infarction) (HCC) 07/11/2015  . Chest pain 04/30/2015  . Syncope 04/30/2015  .  Orthostatic syncope 04/30/2015  . Chronic back pain   . (HFpEF) heart failure with preserved ejection fraction (HCC)   . History of cocaine abuse (HCC)   . History of alcohol abuse   . Prediabetes 08/12/2014  . Tobacco abuse 09/21/2013  . Sleep apnea- C-pap intol 09/14/2013  . Coronary atherosclerosis of native coronary artery   . OBESITY, MORBID 05/29/2008  . Dyslipidemia 01/19/2007  . ANXIETY DISORDER, GENERALIZED 01/19/2007  . Depression 10/20/2006  . Essential hypertension, benign 10/20/2006  . COPD (chronic obstructive pulmonary disease) (HCC) 10/20/2006    Past Surgical History:  Procedure Laterality Date  . BACK SURGERY    . CHOLECYSTECTOMY    . CORONARY ARTERY BYPASS GRAFT    . HIP SURGERY     rt  . JOINT REPLACEMENT    . LEFT HEART CATHETERIZATION WITH CORONARY ANGIOGRAM N/A 09/12/2013   Procedure: LEFT HEART CATHETERIZATION WITH CORONARY ANGIOGRAM;  Surgeon: Kathleene Hazel, MD;  Location: [facility] CATH LAB;  Service: Cardiovascular;  Laterality: N/A;  . NASAL/SINUS ENDOSCOPY    . TOTAL HIP ARTHROPLASTY  05/08/2012   Procedure: TOTAL HIP ARTHROPLASTY;  Surgeon: Nestor Lewandowsky, MD;  Location: [facility] OR;  Service: Orthopedics;  Laterality: Left;  . TOTAL HIP ARTHROPLASTY  2014 per pt    Current Outpatient Rx  . Order #: [id number] Class: Historical Med  . Order #: [id number] Class: Normal  . Order #: [id number] Class: Historical Med  .  Order #: [id number] Class: Historical Med  . Order #: [id number] Class: Historical Med  . Order #: [id number] Class: Historical Med  . Order #: [id number] Class: Historical Med  . Order #: [id number] Class: Historical Med  . Order #: [id number] Class: Historical Med  . Order #: [id number] Class: Historical Med  . Order #: [id number] Class: Historical Med  . Order #: [id number] Class: Historical Med  . Order #: [id number] Class: Historical Med  . Order #: [id number] Class: Historical Med  . Order #: [id number] Class: Historical Med  . Order #: [id number] Class: Normal     Allergies Ativan [lorazepam]; Atorvastatin; Fluticasone furoate-vilanterol; Haloperidol decanoate; Tiotropium; and Sulfamethoxazole-trimethoprim  Family History  Problem Relation Age of Onset  . Heart attack Mother   . Heart attack Brother   . Heart attack Sister   . Heart attack Father     Social History Social History   Tobacco Use  . Smoking status: Former Smoker    Packs/day: 0.50    Years: 39.00    Pack years: 19.50    Types: Cigarettes    Last attempt to quit: 09/15/2015    Years since quitting: 3.1  . Smokeless tobacco: Never Used  Substance Use Topics  . Alcohol use: Yes    Alcohol/week: 0.0 standard drinks    Comment: socially; reports drinking 2 beers last nigh  . Drug use: No    Review of Systems Constitutional: No fever/chills.  +2 pound weight gain. Eyes: No visual changes. ENT: No sore throat. No congestion or rhinorrhea. Cardiovascular: Positive chest pain. Denies palpitations. Respiratory: Positive orthopnea and exertional shortness of breath.  No cough. Gastrointestinal: No abdominal pain.  No nausea, no vomiting.  No diarrhea.  No constipation. Genitourinary: Negative for dysuria. Musculoskeletal: Negative for back pain.  No lower extremity swelling or calf pain. Skin: Negative for rash. Neurological: Negative for headaches. No focal numbness, tingling or weakness.     ____________________________________________   PHYSICAL EXAM:  VITAL SIGNS: ED Triage Vitals  Enc Vitals Group     BP 10/22/18 1723 138/73     Pulse Rate 10/22/18 1723 83     Resp 10/22/18 1723 19     Temp 10/22/18 1723 98.6 F (37 C)     Temp src --      SpO2 10/22/18 1723 96 %     Weight 10/22/18 1721 (!) 354 lb (160.6 kg)     Height 10/22/18 1721 6\' 2"  (1.88 m)     Head Circumference --      Peak Flow --      Pain Score 10/22/18 1721 8     Pain Loc --      Pain Edu? --      Excl. in GC? --     Constitutional: Alert and oriented. Answers questions  appropriately.  Chronically ill-appearing. Eyes: Conjunctivae are normal.  EOMI. No scleral icterus. Head: Atraumatic. Nose: No congestion/rhinnorhea. Mouth/Throat: Mucous membranes are moist.  Neck: No stridor.  Supple.  No JVD.  No meningismus. Cardiovascular: Normal rate, regular rhythm. No murmurs, rubs or gallops.  Respiratory: Patient is mildly tachypneic without accessory muscle use or retractions.  He has end expiratory wheezing.  He has a prolonged expiratory phase.  No rales or rhonchi Gastrointestinal: Morbidly obese soft, nontender and nondistended.  No guarding or rebound.  No peritoneal signs. Musculoskeletal: No LE edema. No ttp in the calves or palpable cords.  Negative Homan's sign. Neurologic:  A&Ox3.  Speech is clear.  Face and smile are symmetric.  EOMI.  Moves all extremities well. Skin:  Skin is warm, dry and intact. No rash noted. Psychiatric: Mood and affect are normal. Speech and behavior are normal.  Normal judgement  ____________________________________________   LABS (all labs ordered are listed, but only abnormal results are displayed)  Labs Reviewed  BASIC METABOLIC PANEL - Abnormal; Notable for the following components:      Result Value   Glucose, Bld 121 (*)    BUN 22 (*)    All other components within normal limits  CBC  TROPONIN I  PROTIME-INR  BRAIN NATRIURETIC PEPTIDE   ____________________________________________  EKG  ED ECG REPORT I, Anne-Caroline Sharma Covert, the attending physician, personally viewed and interpreted this ECG.   Date: 10/22/2018  EKG Time: 1719  Rate: 80  Rhythm: normal sinus rhythm  Axis: normal  Intervals:none  ST&T Change: No STEMI  ____________________________________________  RADIOLOGY  Dg Chest 2 View  Result Date: 10/22/2018 CLINICAL DATA:  Former smoker.  Mid sternal chest pain. EXAM: CHEST - 2 VIEW COMPARISON:  09/08/2018 FINDINGS: Mild cardiac enlargement with aortic atherosclerosis. No pleural effusion.  Pulmonary vascular congestion. No airspace opacities. Spondylosis identified within the thoracic spine. IMPRESSION: Cardiac enlargement and pulmonary vascular congestion. Electronically Signed   By: Signa Kell M.D.   On: 10/22/2018 18:38    ____________________________________________   PROCEDURES  Procedure(s) performed: None  Procedures  Critical Care performed: No ____________________________________________   INITIAL IMPRESSION / ASSESSMENT AND PLAN / ED COURSE  Pertinent labs & imaging results that were available during my care of the patient were reviewed by me and considered in my medical decision making (see chart for details).  63 y.o. male with COPD, CAD and CHF presenting with 5 days of progressively worsening exertional dyspnea, orthopnea, 2 pound weight gain.  Overall, the patient is hemodynamically stable and able to maintain normal oxygen saturations.  His symptoms may be consistent with CHF exacerbation although he does not have a significant amount of lower extremity edema; he does have some pulmonary vascular congestion on x-ray.  His BNP, however, is normal.  His troponin is also reassuring.  We will treat the patient with IV Lasix.  Other etiologies including COPD exacerbation are also possible and a DuoNeb has been ordered.  The patient will also receive Solu-Medrol.  I do not see any signs or symptoms that would be consistent with infection, including pneumonia.  I do not see evidence of ACS or acute MI today.  The patient will be admitted for continued evaluation and treatment.  ____________________________________________  FINAL CLINICAL IMPRESSION(S) / ED DIAGNOSES  Final diagnoses:  Acute on chronic congestive heart failure, unspecified heart failure type (HCC)  COPD exacerbation (HCC)  Chest pain, unspecified type         NEW MEDICATIONS STARTED DURING THIS VISIT:  New Prescriptions   No medications on file      Rockne Menghini,  MD 10/22/18 1907

## 2018-10-23 LAB — BASIC METABOLIC PANEL
ANION GAP: 11 (ref 5–15)
BUN: 22 mg/dL — ABNORMAL HIGH (ref 6–20)
CO2: 21 mmol/L — ABNORMAL LOW (ref 22–32)
Calcium: 9.3 mg/dL (ref 8.9–10.3)
Chloride: 104 mmol/L (ref 98–111)
Creatinine, Ser: 1.37 mg/dL — ABNORMAL HIGH (ref 0.61–1.24)
GFR calc Af Amer: >60 mL/min (ref >60)
GFR calc non Af Amer: 56 mL/min — ABNORMAL LOW (ref >60)
Glucose, Bld: 239 mg/dL — ABNORMAL HIGH (ref 70–99)
Potassium: 3.8 mmol/L (ref 3.5–5.1)
Sodium: 136 mmol/L (ref 135–145)

## 2018-10-23 LAB — CBC
HCT: 41.6 % (ref 39.0–52.0)
Hemoglobin: 14.1 g/dL (ref 13.0–17.0)
MCH: 31.5 pg (ref 26.0–34.0)
MCHC: 33.9 g/dL (ref 30.0–36.0)
MCV: 92.9 fL (ref 80.0–100.0)
Platelets: 275 10*3/uL (ref 150–400)
RBC: 4.48 MIL/uL (ref 4.22–5.81)
RDW: 13.2 % (ref 11.5–15.5)
WBC: 7.3 10*3/uL (ref 4.0–10.5)
nRBC: 0 % (ref 0.0–0.2)

## 2018-10-23 LAB — GLUCOSE, CAPILLARY: Glucose-Capillary: 200 mg/dL — ABNORMAL HIGH (ref 70–99)

## 2018-10-23 MED ORDER — IPRATROPIUM-ALBUTEROL 0.5-2.5 (3) MG/3ML IN SOLN: 3.0000 mL | Freq: Four times a day (QID) | RESPIRATORY_TRACT | Status: DC | PRN

## 2018-10-23 NOTE — Progress Notes (Signed)
   10/23/18 1100  Clinical Encounter Type  Visited With Patient not available;Health care provider  Visit Type Initial  Referral From Nurse   Chaplain made aware that the patient left AMA this morning. Unable to visit prior to him leaving.

## 2018-10-23 NOTE — Discharge Summary (Signed)
Pt decided to leave AMA early this morning- before I see him. He had appointment today with his Heart doctors at [facility].

## 2018-10-23 NOTE — Progress Notes (Signed)
Pt stated that he spoke with his dr at [facility] and was able to get an appt today at 11:30 so he had to leave, this nurse was in another pts room and stated give me 15 minutes and I will come into talk with him. Approx 5 minutes later pt called this nurses phone and stated " I need to leave now, please take out my iv" this nurse responded with give me a few minutes Im in another room, plus I need to page the dr and let him now what your plan is. As Im finishing up in another room, pt calls back again and states" if you don't take out the iv I will, I need to go to my appt" This nurse went into the pts room with a AMA form in hand. This nurse took the pts iv out while he signed the form. Pt requested a WC ride to the ER entrance, this nurse brought him down and pt walked to own vehicle. AMA form is signed in pts chart

## 2018-10-23 NOTE — Progress Notes (Signed)
PT Cancellation Note  Patient Details Name: [name] OVERCASH Sr. MRN: [id number] DOB: 24-May-1956   Cancelled Treatment:    Reason Eval/Treat Not Completed: Other (comment).  PT consult received.  Chart reviewed.  Pt not in room.  Per MD note and per nurse, pt left AMA this morning.  Hendricks Limes, PT 10/23/18, 10:49 AM [phone]

## 2018-11-01 ENCOUNTER — Other Ambulatory Visit: Payer: Self-pay

## 2018-11-01 ENCOUNTER — Emergency Department
Admission: EM | Admit: 2018-11-01 | Discharge: 2018-11-01 | Disposition: A | Discharge status: Home / Self Care | Payer: Medicare Other | Attending: Emergency Medicine | Admitting: Emergency Medicine

## 2018-11-01 ENCOUNTER — Encounter: Payer: Self-pay | Admitting: *Deleted

## 2018-11-01 ENCOUNTER — Emergency Department: Payer: Medicare Other

## 2018-11-01 DIAGNOSIS — R079 Chest pain, unspecified: Secondary | ICD-10-CM | POA: Diagnosis present

## 2018-11-01 DIAGNOSIS — Z5321 Procedure and treatment not carried out due to patient leaving prior to being seen by health care provider: Secondary | ICD-10-CM | POA: Insufficient documentation

## 2018-11-01 LAB — BASIC METABOLIC PANEL
Anion gap: 9 (ref 5–15)
BUN: 16 mg/dL (ref 6–20)
CO2: 21 mmol/L — ABNORMAL LOW (ref 22–32)
Calcium: 8.7 mg/dL — ABNORMAL LOW (ref 8.9–10.3)
Chloride: 108 mmol/L (ref 98–111)
Creatinine, Ser: 1.13 mg/dL (ref 0.61–1.24)
GFR calc Af Amer: >60 mL/min (ref >60)
GFR calc non Af Amer: >60 mL/min (ref >60)
Glucose, Bld: 148 mg/dL — ABNORMAL HIGH (ref 70–99)
Potassium: 3.9 mmol/L (ref 3.5–5.1)
Sodium: 138 mmol/L (ref 135–145)

## 2018-11-01 LAB — CBC
HCT: 39.7 % (ref 39.0–52.0)
Hemoglobin: 13.5 g/dL (ref 13.0–17.0)
MCH: 31.8 pg (ref 26.0–34.0)
MCHC: 34 g/dL (ref 30.0–36.0)
MCV: 93.4 fL (ref 80.0–100.0)
Platelets: 253 10*3/uL (ref 150–400)
RBC: 4.25 MIL/uL (ref 4.22–5.81)
RDW: 13.7 % (ref 11.5–15.5)
WBC: 9.2 10*3/uL (ref 4.0–10.5)
nRBC: 0 % (ref 0.0–0.2)

## 2018-11-01 LAB — TROPONIN I: Troponin I: <0.03 ng/mL (ref <0.03)

## 2018-11-01 MED ORDER — SODIUM CHLORIDE 0.9% FLUSH: 3.0000 mL | Freq: Once | INTRAVENOUS | Status: DC

## 2018-11-01 NOTE — ED Notes (Signed)
First Nurse Note: Pt to ED c/o chest pain. Pt pulled from line and taken to back for EKG

## 2018-11-01 NOTE — ED Triage Notes (Signed)
Pt to triage via wheelchair.  Pt reports pain in left chest.  Pt also reports sob with exertion.  Pt has nausea.  No vomiting.  Pt alert  Speech clear.

## 2018-11-02 ENCOUNTER — Other Ambulatory Visit: Payer: Self-pay

## 2018-11-02 ENCOUNTER — Emergency Department
Admission: EM | Admit: 2018-11-02 | Discharge: 2018-11-02 | Disposition: A | Discharge status: Home / Self Care | Payer: Medicare Other | Attending: Emergency Medicine | Admitting: Emergency Medicine

## 2018-11-02 ENCOUNTER — Encounter: Payer: Self-pay | Admitting: *Deleted

## 2018-11-02 ENCOUNTER — Telehealth: Payer: Self-pay | Admitting: Family

## 2018-11-02 ENCOUNTER — Telehealth: Payer: Self-pay | Admitting: Emergency Medicine

## 2018-11-02 ENCOUNTER — Ambulatory Visit: Payer: Medicare Other | Admitting: Family

## 2018-11-02 DIAGNOSIS — Z5321 Procedure and treatment not carried out due to patient leaving prior to being seen by health care provider: Secondary | ICD-10-CM | POA: Insufficient documentation

## 2018-11-02 DIAGNOSIS — R079 Chest pain, unspecified: Secondary | ICD-10-CM | POA: Diagnosis present

## 2018-11-02 LAB — CBC
HCT: 38.7 % — ABNORMAL LOW (ref 39.0–52.0)
Hemoglobin: 12.8 g/dL — ABNORMAL LOW (ref 13.0–17.0)
MCH: 31 pg (ref 26.0–34.0)
MCHC: 33.1 g/dL (ref 30.0–36.0)
MCV: 93.7 fL (ref 80.0–100.0)
PLATELETS: 244 10*3/uL (ref 150–400)
RBC: 4.13 MIL/uL — ABNORMAL LOW (ref 4.22–5.81)
RDW: 13.6 % (ref 11.5–15.5)
WBC: 9.7 10*3/uL (ref 4.0–10.5)
nRBC: 0 % (ref 0.0–0.2)

## 2018-11-02 LAB — BASIC METABOLIC PANEL
Anion gap: 11 (ref 5–15)
BUN: 14 mg/dL (ref 6–20)
CO2: 20 mmol/L — ABNORMAL LOW (ref 22–32)
CREATININE: 1.02 mg/dL (ref 0.61–1.24)
Calcium: 8.9 mg/dL (ref 8.9–10.3)
Chloride: 106 mmol/L (ref 98–111)
GFR calc Af Amer: >60 mL/min (ref >60)
GFR calc non Af Amer: >60 mL/min (ref >60)
Glucose, Bld: 125 mg/dL — ABNORMAL HIGH (ref 70–99)
Potassium: 3.9 mmol/L (ref 3.5–5.1)
Sodium: 137 mmol/L (ref 135–145)

## 2018-11-02 LAB — TROPONIN I: Troponin I: <0.03 ng/mL (ref <0.03)

## 2018-11-02 MED ORDER — SODIUM CHLORIDE 0.9% FLUSH: 3.0000 mL | Freq: Once | INTRAVENOUS | Status: DC

## 2018-11-02 NOTE — ED Notes (Signed)
Pt ambulatory to desk saying he is leaving.   Says he can wait at home.  He says he will return if it gets worse.  Then walked toward door.

## 2018-11-02 NOTE — ED Triage Notes (Signed)
Pt states that he was seen yesterday for CP but left.  He was called back today and told that his CXR was abnormal and he should come back to be seen.  CXR from yesterday shows: "Mild vascular congestion noted. Mild chronically increased interstitial markings again noted." Pt reports continued CP with sob and swelling to lower legs.

## 2018-11-02 NOTE — Telephone Encounter (Signed)
Patient did not show for his [facility] appointment on 11/02/2018. Will attempt to reschedule.  

## 2018-11-02 NOTE — Telephone Encounter (Signed)
Called patient due to lwot to inquire about condition and follow up plans. Says he still has chest pain now.  He has not called his doctor about his pain.  He asked about his labs and xray.  I explained that his doctor could see results, but the important thing was his symptom of chest pain, and he really needs exam.  I told him he can always return here.  he says he is going to call his doctor and then he may return.

## 2018-11-27 ENCOUNTER — Emergency Department
Admission: EM | Admit: 2018-11-27 | Discharge: 2018-11-27 | Disposition: A | Discharge status: Home / Self Care | Payer: Medicare Other | Attending: Emergency Medicine | Admitting: Emergency Medicine

## 2018-11-27 ENCOUNTER — Other Ambulatory Visit: Payer: Self-pay

## 2018-11-27 ENCOUNTER — Emergency Department: Payer: Medicare Other

## 2018-11-27 ENCOUNTER — Encounter: Payer: Self-pay | Admitting: Emergency Medicine

## 2018-11-27 DIAGNOSIS — Z87891 Personal history of nicotine dependence: Secondary | ICD-10-CM | POA: Insufficient documentation

## 2018-11-27 DIAGNOSIS — R0601 Orthopnea: Secondary | ICD-10-CM | POA: Insufficient documentation

## 2018-11-27 DIAGNOSIS — R0609 Other forms of dyspnea: Secondary | ICD-10-CM | POA: Diagnosis not present

## 2018-11-27 DIAGNOSIS — R635 Abnormal weight gain: Secondary | ICD-10-CM | POA: Diagnosis not present

## 2018-11-27 DIAGNOSIS — I110 Hypertensive heart disease with heart failure: Secondary | ICD-10-CM | POA: Diagnosis not present

## 2018-11-27 DIAGNOSIS — R0602 Shortness of breath: Secondary | ICD-10-CM | POA: Diagnosis present

## 2018-11-27 DIAGNOSIS — J449 Chronic obstructive pulmonary disease, unspecified: Secondary | ICD-10-CM | POA: Diagnosis not present

## 2018-11-27 DIAGNOSIS — I2510 Atherosclerotic heart disease of native coronary artery without angina pectoris: Secondary | ICD-10-CM | POA: Insufficient documentation

## 2018-11-27 DIAGNOSIS — I5030 Unspecified diastolic (congestive) heart failure: Secondary | ICD-10-CM | POA: Insufficient documentation

## 2018-11-27 DIAGNOSIS — R079 Chest pain, unspecified: Secondary | ICD-10-CM

## 2018-11-27 LAB — COMPREHENSIVE METABOLIC PANEL
ALT: 20 U/L (ref 0–44)
AST: 22 U/L (ref 15–41)
Albumin: 4.3 g/dL (ref 3.5–5.0)
Alkaline Phosphatase: 95 U/L (ref 38–126)
Anion gap: 11 (ref 5–15)
BUN: 17 mg/dL (ref 6–20)
CO2: 20 mmol/L — ABNORMAL LOW (ref 22–32)
Calcium: 9.1 mg/dL (ref 8.9–10.3)
Chloride: 107 mmol/L (ref 98–111)
Creatinine, Ser: 1.17 mg/dL (ref 0.61–1.24)
GFR calc Af Amer: >60 mL/min (ref >60)
GFR calc non Af Amer: >60 mL/min (ref >60)
Glucose, Bld: 106 mg/dL — ABNORMAL HIGH (ref 70–99)
Potassium: 3.6 mmol/L (ref 3.5–5.1)
Sodium: 138 mmol/L (ref 135–145)
Total Bilirubin: 0.8 mg/dL (ref 0.3–1.2)
Total Protein: 7.8 g/dL (ref 6.5–8.1)

## 2018-11-27 LAB — CBC WITH DIFFERENTIAL/PLATELET
Abs Immature Granulocytes: 0.04 10*3/uL (ref 0.00–0.07)
Basophils Absolute: 0.1 10*3/uL (ref 0.0–0.1)
Basophils Relative: 1 %
Eosinophils Absolute: 0.4 10*3/uL (ref 0.0–0.5)
Eosinophils Relative: 5 %
HCT: 40.1 % (ref 39.0–52.0)
Hemoglobin: 13.6 g/dL (ref 13.0–17.0)
Immature Granulocytes: 0 %
Lymphocytes Relative: 29 %
Lymphs Abs: 2.7 10*3/uL (ref 0.7–4.0)
MCH: 31.7 pg (ref 26.0–34.0)
MCHC: 33.9 g/dL (ref 30.0–36.0)
MCV: 93.5 fL (ref 80.0–100.0)
Monocytes Absolute: 1.2 10*3/uL — ABNORMAL HIGH (ref 0.1–1.0)
Monocytes Relative: 13 %
Neutro Abs: 4.7 10*3/uL (ref 1.7–7.7)
Neutrophils Relative %: 52 %
Platelets: 260 10*3/uL (ref 150–400)
RBC: 4.29 MIL/uL (ref 4.22–5.81)
RDW: 13.1 % (ref 11.5–15.5)
WBC: 9.2 10*3/uL (ref 4.0–10.5)
nRBC: 0 % (ref 0.0–0.2)

## 2018-11-27 LAB — TROPONIN I: Troponin I: <0.03 ng/mL (ref <0.03)

## 2018-11-27 LAB — BRAIN NATRIURETIC PEPTIDE: B Natriuretic Peptide: 30 pg/mL (ref 0.0–100.0)

## 2018-11-27 MED ORDER — ASPIRIN 81 MG PO CHEW
324.0000 mg | CHEWABLE_TABLET | Freq: Once | ORAL | Status: DC
  Filled 2018-11-27: qty 4

## 2018-11-27 MED ORDER — ALBUTEROL SULFATE HFA 108 (90 BASE) MCG/ACT IN AERS
2.0000 | INHALATION_SPRAY | Freq: Once | RESPIRATORY_TRACT | Status: AC
  Administered 2018-11-27: 21:00:00 2 via RESPIRATORY_TRACT
  Filled 2018-11-27: qty 6.7

## 2018-11-27 MED ORDER — FUROSEMIDE 10 MG/ML IJ SOLN
40.0000 mg | Freq: Once | INTRAMUSCULAR | Status: DC
  Filled 2018-11-27: qty 4

## 2018-11-27 NOTE — ED Triage Notes (Addendum)
Pt to triage via w/c with no distress noted, mask in place; pt reports mid CP accomp by [facility] x 3 days; st hx CHF; also reports prod cough green sputum with hx COPD; wheezing audible with exertion

## 2018-11-27 NOTE — ED Provider Notes (Addendum)
[facility] Emergency Department Provider Note  ____________________________________________  Time seen: Approximately 7:54 PM  I have reviewed the triage vital signs and the nursing notes.   HISTORY  Chief Complaint Chest Pain; Shortness of Breath; and Cough    HPI [name] LOUIS Sr. is a 63 y.o. male with a history of COPD not on oxygen, CAD status post CABG and multiple revisions, CHF, presenting with chest pain and shortness of breath.  The patient reports that over the last several days he has noted increased swelling of the lower extremities, 5 pound weight gain, and increasing shortness of breath which is worse with lying down or exertion.  He reports his Lasix dosing was doubled a month ago, but he has not taken any extra Lasix.  He also reports a mild cough without any congestion or rhinorrhea, sore throat, ear pain, fevers or chills.  He has not had any nausea vomiting or diarrhea but he has had a loss of appetite.  Still reports a central chest discomfort which has been constant for 3 days without any associated radiation, diaphoresis, nausea or vomiting.  Past Medical History:  Diagnosis Date  . Anxiety disorder   . Arthritis   . CAD (coronary artery disease)    a. ~ 2000 cath at [facility];  b. 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  . CHF (congestive heart failure) (HCC)   . Chronic back pain   . Chronic hip pain   . COPD (chronic obstructive pulmonary disease) (HCC)   . Depression   . Essential hypertension, benign   . GERD (gastroesophageal reflux disease)   . Headache(784.0)   . History of alcohol abuse   . History of cocaine abuse (HCC)   . Hyperlipidemia   . Obesity   . Pneumonia     Patient Active Problem List   Diagnosis Date Noted  . Respiratory failure (HCC) 10/22/2018  . COPD with acute  exacerbation (HCC) 09/08/2018  . Hyperthyroidism 07/26/2018  . NSTEMI (non-ST elevated myocardial infarction) (HCC) 07/11/2015  . Chest pain 04/30/2015  . Syncope 04/30/2015  . Orthostatic syncope 04/30/2015  . Chronic back pain   . (HFpEF) heart failure with preserved ejection fraction (HCC)   . History of cocaine abuse (HCC)   . History of alcohol abuse   . Prediabetes 08/12/2014  . Tobacco abuse 09/21/2013  . Sleep apnea- C-pap intol 09/14/2013  . Coronary atherosclerosis of native coronary artery   . OBESITY, MORBID 05/29/2008  . Dyslipidemia 01/19/2007  . ANXIETY DISORDER, GENERALIZED 01/19/2007  . Depression 10/20/2006  . Essential hypertension, benign 10/20/2006  . COPD (chronic obstructive pulmonary disease) (HCC) 10/20/2006    Past Surgical History:  Procedure Laterality Date  . BACK SURGERY    . CHOLECYSTECTOMY    . CORONARY ARTERY BYPASS GRAFT    . HIP SURGERY     rt  . JOINT REPLACEMENT    . LEFT HEART CATHETERIZATION WITH CORONARY ANGIOGRAM N/A 09/12/2013   Procedure: LEFT HEART CATHETERIZATION WITH CORONARY ANGIOGRAM;  Surgeon: Kathleene Hazel, MD;  Location: [facility] CATH LAB;  Service: Cardiovascular;  Laterality: N/A;  . NASAL/SINUS ENDOSCOPY    . TOTAL HIP ARTHROPLASTY  05/08/2012   Procedure: TOTAL HIP ARTHROPLASTY;  Surgeon: Nestor Lewandowsky, MD;  Location: [facility] OR;  Service: Orthopedics;  Laterality: Left;  . TOTAL  HIP ARTHROPLASTY  2014 per pt    Current Outpatient Rx  . Order #: [id number] Class: Historical Med  . Order #: [id number] Class: Normal  . Order #: [id number] Class: Historical Med  . Order #: [id number] Class: Historical Med  . Order #: [id number] Class: Historical Med  . Order #: [id number] Class: Historical Med  . Order #: [id number] Class: Historical Med  . Order #: [id number] Class: Historical Med  . Order #: [id number] Class: Historical Med  . Order #: [id number] Class: Historical Med  . Order #: [id number] Class: Historical Med  . Order #: [id number] Class:  Historical Med  . Order #: [id number] Class: Historical Med  . Order #: [id number] Class: Normal  . Order #: [id number] Class: Historical Med  . Order #: [id number] Class: Historical Med    Allergies Ativan [lorazepam]; Atorvastatin; Fluticasone furoate-vilanterol; Haloperidol decanoate; Tiotropium; and Sulfamethoxazole-trimethoprim  Family History  Problem Relation Age of Onset  . Heart attack Mother   . Heart attack Brother   . Heart attack Sister   . Heart attack Father     Social History Social History   Tobacco Use  . Smoking status: Former Smoker    Packs/day: 0.50    Years: 39.00    Pack years: 19.50    Types: Cigarettes    Last attempt to quit: 09/15/2015    Years since quitting: 3.2  . Smokeless tobacco: Never Used  Substance Use Topics  . Alcohol use: Yes    Alcohol/week: 0.0 standard drinks    Comment: socially; reports drinking 2 beers last nigh  . Drug use: No    Review of Systems Constitutional: No fever/chills.  No lightheadedness or syncope.  +5 pound weight gain. Eyes: No visual changes. ENT: No sore throat. No congestion or rhinorrhea. Cardiovascular: Positive central chest pain. Denies palpitations. Respiratory: Positive orthopnea and exertional shortness of breath.  Positive dry cough. Gastrointestinal: No abdominal pain.  No nausea, no vomiting.  Positive decreased appetite.  No diarrhea.  No constipation. Genitourinary: Negative for dysuria. Musculoskeletal: Negative for back pain.  Positive lower extremity swelling without calf pain. Skin: Negative for rash. Neurological: Negative for headaches. No focal numbness, tingling or weakness.     ____________________________________________   PHYSICAL EXAM:  VITAL SIGNS: ED Triage Vitals  Enc Vitals Group     BP 11/27/18 1928 (!) 145/70     Pulse Rate 11/27/18 1928 91     Resp 11/27/18 1928 (!) 24     Temp 11/27/18 1928 97.9 F (36.6 C)     Temp Source 11/27/18 1928 Oral     SpO2 11/27/18 1928  98 %     Weight 11/27/18 1923 (!) 355 lb (161 kg)     Height 11/27/18 1923 6\' 3"  (1.905 m)     Head Circumference --      Peak Flow --      Pain Score 11/27/18 1923 8     Pain Loc --      Pain Edu? --      Excl. in GC? --     Constitutional: Alert and oriented.  Answers questions appropriately. Eyes: Conjunctivae are normal.  EOMI. No scleral icterus. Head: Atraumatic. Nose: No congestion/rhinnorhea. Mouth/Throat: Mucous membranes are moist.  Neck: No stridor.  Supple.  No meningismus. Cardiovascular: Normal rate, regular rhythm. No murmurs, rubs or gallops. Large scar with concavity in central chest. Respiratory: Patient is tachypneic without accessory muscle use or retractions.  He is able to speak in full sentences without difficulty.  He has end expiratory wheezing with a prolonged expiratory phase.  No  rales or rhonchi. Gastrointestinal: Obese.  Soft, nontender and nondistended.  No guarding or rebound.  No peritoneal signs. Musculoskeletal: Positive bilateral symmetric LE edema to the mid tibial shaft.. No ttp in the calves or palpable cords.  Negative Homan's sign. Neurologic:  A&Ox3.  Speech is clear.  Face and smile are symmetric.  EOMI.  Moves all extremities well. Skin:  Skin is warm, dry and intact. No rash noted. Psychiatric: Mood and affect are normal. Speech and behavior are normal.  Normal judgement.  ____________________________________________   LABS (all labs ordered are listed, but only abnormal results are displayed)  Labs Reviewed  CBC WITH DIFFERENTIAL/PLATELET - Abnormal; Notable for the following components:      Result Value   Monocytes Absolute 1.2 (*)    All other components within normal limits  COMPREHENSIVE METABOLIC PANEL - Abnormal; Notable for the following components:   CO2 20 (*)    Glucose, Bld 106 (*)    All other components within normal limits  TROPONIN I  BRAIN NATRIURETIC PEPTIDE    ____________________________________________  EKG  ED ECG REPORT I, Anne-Caroline Sharma Covert, the attending physician, personally viewed and interpreted this ECG.   Date: 11/27/2018  EKG Time: 1926  Rate: 82  Rhythm: normal sinus rhythm  Axis: normal  Intervals:none  ST&T Change: No STEMI  ____________________________________________  RADIOLOGY  Dg Chest Portable 1 View  Result Date: 11/27/2018 CLINICAL DATA:  Chest pain and shortness of breath EXAM: PORTABLE CHEST 1 VIEW COMPARISON:  November 01, 2018 FINDINGS: There is no appreciable edema or consolidation. Heart is mildly enlarged with pulmonary vascularity normal. No adenopathy. There is aortic atherosclerosis. Surgical clips are present bilaterally. IMPRESSION: No edema or consolidation. Heart mildly enlarged. Aortic Atherosclerosis (ICD10-I70.0). Electronically Signed   By: Bretta Bang III M.D.   On: 11/27/2018 20:26    ____________________________________________   PROCEDURES  Procedure(s) performed: None  Procedures  Critical Care performed: No ____________________________________________   INITIAL IMPRESSION / ASSESSMENT AND PLAN / ED COURSE  Pertinent labs & imaging results that were available during my care of the patient were reviewed by me and considered in my medical decision making (see chart for details).  63 y.o. male with COPD and CHF, CAD, presenting with chest pain and shortness of breath.  Overall, I am concerned about a cardiac pathology including CHF exacerbation which may be causing the patient's symptoms.  Clinically, he has not had any fever, or other significant signs or symptoms of an infectious process.  However, given the pandemic, coronavirus is on the differential but less likely.  COPD exacerbation is also possible, but the patient continues to speak in full sentences and maintain normal oxygen saturations.  PE, aortic pathology, are also considered but less likely.  Today, there is no  evidence for ACS or MI, with a reassuring EKG and a troponin that is negative we will plan basic laboratory studies, chest x-ray, diuresis as indicated, for final disposition.  ----------------------------------------- 8:37 PM on 11/27/2018 -----------------------------------------  Patient continues to be hemodynamically stable.  His white blood cell count is normal, he is not anemic, and his electrolytes are reassuring.  His creatinine is 1.17.  His troponin is negative and his BNP is within normal limits.  Chest x-ray does not show any significant fluid overload.  The patient's main concern at this time is that he is requesting narcotics for his back pain.  I have let him know that his work-up has been reassuring and he will likely be able to be  discharged home.  He can have oral diuresis at home, or 1 dose of IV Lasix here.  I will increase his Lasix for the next 3 days and he will continue with his home medications.  I have asked him to make a phone call to his cardiologist at [facility] tomorrow for reevaluation.  Follow-up instructions as well as return precautions were discussed.  ____________________________________________  FINAL CLINICAL IMPRESSION(S) / ED DIAGNOSES  Final diagnoses:  Weight gain  Orthopnea  Dyspnea on exertion  Chest pain, unspecified type         NEW MEDICATIONS STARTED DURING THIS VISIT:  New Prescriptions   No medications on file      Rockne Menghini, MD 11/27/18 2039    Rockne Menghini, MD 11/27/18 2041

## 2018-11-27 NOTE — Discharge Instructions (Addendum)
You may increase your Lasix to 40mg  TWICE daily for the next 3 days.  Please make an appointment with your cardiologist at [facility] in the next 1-2 days, even if it is a telemedicine evaluation.  Strictly monitor your weight and input/output as you have been directed to do in the past.  Return to the emergency department for severe pain, lightheadedness or fainting, worsening shortness of breath, or any other symptoms concerning to you.

## 2019-01-23 ENCOUNTER — Emergency Department: Payer: Medicare Other

## 2019-01-23 ENCOUNTER — Emergency Department
Admission: EM | Admit: 2019-01-23 | Discharge: 2019-01-23 | Disposition: A | Discharge status: Home / Self Care | Payer: Medicare Other | Attending: Emergency Medicine | Admitting: Emergency Medicine

## 2019-01-23 ENCOUNTER — Other Ambulatory Visit: Payer: Self-pay

## 2019-01-23 ENCOUNTER — Ambulatory Visit: Payer: Self-pay

## 2019-01-23 DIAGNOSIS — I509 Heart failure, unspecified: Secondary | ICD-10-CM | POA: Insufficient documentation

## 2019-01-23 DIAGNOSIS — Z7982 Long term (current) use of aspirin: Secondary | ICD-10-CM | POA: Insufficient documentation

## 2019-01-23 DIAGNOSIS — Z87891 Personal history of nicotine dependence: Secondary | ICD-10-CM | POA: Insufficient documentation

## 2019-01-23 DIAGNOSIS — R079 Chest pain, unspecified: Secondary | ICD-10-CM

## 2019-01-23 DIAGNOSIS — I10 Essential (primary) hypertension: Secondary | ICD-10-CM | POA: Insufficient documentation

## 2019-01-23 DIAGNOSIS — R0789 Other chest pain: Secondary | ICD-10-CM | POA: Insufficient documentation

## 2019-01-23 DIAGNOSIS — Z79899 Other long term (current) drug therapy: Secondary | ICD-10-CM | POA: Insufficient documentation

## 2019-01-23 DIAGNOSIS — I110 Hypertensive heart disease with heart failure: Secondary | ICD-10-CM | POA: Diagnosis not present

## 2019-01-23 DIAGNOSIS — Z951 Presence of aortocoronary bypass graft: Secondary | ICD-10-CM | POA: Diagnosis not present

## 2019-01-23 LAB — CBC WITH DIFFERENTIAL/PLATELET
Abs Immature Granulocytes: 0.04 10*3/uL (ref 0.00–0.07)
Basophils Absolute: 0.1 10*3/uL (ref 0.0–0.1)
Basophils Relative: 1 %
Eosinophils Absolute: 0.4 10*3/uL (ref 0.0–0.5)
Eosinophils Relative: 5 %
HCT: 38.6 % — ABNORMAL LOW (ref 39.0–52.0)
Hemoglobin: 13 g/dL (ref 13.0–17.0)
Immature Granulocytes: 1 %
Lymphocytes Relative: 26 %
Lymphs Abs: 2.2 10*3/uL (ref 0.7–4.0)
MCH: 31.2 pg (ref 26.0–34.0)
MCHC: 33.7 g/dL (ref 30.0–36.0)
MCV: 92.6 fL (ref 80.0–100.0)
Monocytes Absolute: 0.9 10*3/uL (ref 0.1–1.0)
Monocytes Relative: 11 %
Neutro Abs: 4.7 10*3/uL (ref 1.7–7.7)
Neutrophils Relative %: 56 %
Platelets: 238 10*3/uL (ref 150–400)
RBC: 4.17 MIL/uL — ABNORMAL LOW (ref 4.22–5.81)
RDW: 12.6 % (ref 11.5–15.5)
WBC: 8.4 10*3/uL (ref 4.0–10.5)
nRBC: 0 % (ref 0.0–0.2)

## 2019-01-23 LAB — TROPONIN I: Troponin I: <0.03 ng/mL (ref <0.03)

## 2019-01-23 LAB — COMPREHENSIVE METABOLIC PANEL
ALT: 15 U/L (ref 0–44)
AST: 16 U/L (ref 15–41)
Albumin: 3.8 g/dL (ref 3.5–5.0)
Alkaline Phosphatase: 82 U/L (ref 38–126)
Anion gap: 10 (ref 5–15)
BUN: 14 mg/dL (ref 8–23)
CO2: 23 mmol/L (ref 22–32)
Calcium: 8.8 mg/dL — ABNORMAL LOW (ref 8.9–10.3)
Chloride: 105 mmol/L (ref 98–111)
Creatinine, Ser: 1.15 mg/dL (ref 0.61–1.24)
GFR calc Af Amer: >60 mL/min (ref >60)
GFR calc non Af Amer: >60 mL/min (ref >60)
Glucose, Bld: 126 mg/dL — ABNORMAL HIGH (ref 70–99)
Potassium: 3.4 mmol/L — ABNORMAL LOW (ref 3.5–5.1)
Sodium: 138 mmol/L (ref 135–145)
Total Bilirubin: 0.7 mg/dL (ref 0.3–1.2)
Total Protein: 7.5 g/dL (ref 6.5–8.1)

## 2019-01-23 MED ORDER — MORPHINE SULFATE (PF) 4 MG/ML IV SOLN
4.0000 mg | Freq: Once | INTRAVENOUS | Status: AC
  Administered 2019-01-23: 4 mg via INTRAVENOUS
  Filled 2019-01-23: qty 1

## 2019-01-23 MED ORDER — ONDANSETRON HCL 4 MG/2ML IJ SOLN
4.0000 mg | Freq: Once | INTRAMUSCULAR | Status: AC
  Administered 2019-01-23: 4 mg via INTRAVENOUS
  Filled 2019-01-23: qty 2

## 2019-01-23 MED ORDER — ALBUTEROL SULFATE (2.5 MG/3ML) 0.083% IN NEBU
2.5000 mg | INHALATION_SOLUTION | Freq: Once | RESPIRATORY_TRACT | Status: AC
  Administered 2019-01-23: 2.5 mg via RESPIRATORY_TRACT
  Filled 2019-01-23: qty 3

## 2019-01-23 NOTE — ED Notes (Signed)
Pt's R arm now has rash from where BP cuff has been. Pt itching the R arm.

## 2019-01-23 NOTE — ED Notes (Signed)
Pt states his PCP says he is "borderline" for needing home oxygen; states he takes daily breathing treatments; heavy wheezing on auscultation of back.

## 2019-01-23 NOTE — ED Notes (Signed)
Pt up to bedside toilet.  

## 2019-01-23 NOTE — ED Notes (Signed)
Lights dimmed for pt 

## 2019-01-23 NOTE — ED Notes (Signed)
Pt given sandwich tray and soft drink per verbal OK by Dr Lenard Lance.

## 2019-01-23 NOTE — ED Notes (Signed)
EDP notified of pt's inc CP. No new orders.

## 2019-01-23 NOTE — Telephone Encounter (Signed)
  Incoming call from  Pt.  Complains of chest pressure.  Take nitroglecrine  Eases  The pain.  Then statrs again and he  taks another dose of nitroglerine.  Pt has had 9 nitoglerirne yesterday. Recommended that Patient go to nearest ED or ER to be e valuated.  Pt.  vocies understading.                                                                     Reason for Disposition . [1] Chest pain lasts > 5 minutes AND [2] age > 51  Answer Assessment - Initial Assessment Questions 1. DESCRIPTION: "Please describe your heart rate or heart beat that you are having" (e.g., fast/slow, regular/irregular, skipped or extra beats, "palpitations")     Pt. Reports heart pressure since last night.   2. ONSET: "When did it start?" (Minutes, hours or days)       3. DURATION: "How long does it last" (e.g., seconds, minutes, hours)      4. PATTERN "Does it come and go, or has it been constant since it started?"  "Does it get worse with exertion?"   "Are you feeling it now?"     Comes and gpes 5. TAP: "Using your hand, can you tap out what you are feeling on a chair or table in front of you, so that I can hear?" (Note: not all patients can do this)        6. HEART RATE: "Can you tell me your heart rate?" "How many beats in 15 seconds?"  (Note: not all patients can do this)        7. RECURRENT SYMPTOM: "Have you ever had this before?" If so, ask: "When was the last time?" and "What happened that time?"       8. CAUSE: "What do you think is causing the palpitations?"      9. CARDIAC HISTORY: "Do you have any history of heart disease?" (e.g., heart attack, angina, bypass surgery, angioplasty, arrhythmia)      yes 10. OTHER SYMPTOMS: "Do you have any other symptoms?" (e.g., dizziness, chest pain, sweating, difficulty breathing)       Chest pressure 11. PREGNANCY: "Is there any chance you are pregnant?" "When was your last menstrual period?"       na  Protocols used: CHEST PAIN-A-AH, HEART RATE AND HEARTBEAT  QUESTIONS-A-AH

## 2019-01-23 NOTE — ED Notes (Signed)
NT attempting for IV. Pt states he is hard stick and will only allow one try then he demands IV team/ultrasound.

## 2019-01-23 NOTE — ED Notes (Signed)
Pt wheeled to lobby by NT.

## 2019-01-23 NOTE — ED Notes (Signed)
Pt has rash to L arm.

## 2019-01-23 NOTE — ED Notes (Signed)
Lav/grn tubes sent to lab.  

## 2019-01-23 NOTE — ED Notes (Signed)
Verbal order for duoneb from EDP. Pt is allergic to fluticasone/tiotropium. Will notify EDP.

## 2019-01-23 NOTE — ED Triage Notes (Signed)
Pt in via EMS from home d/t CPx3 days. Pt takes PRN nitro at home but states only provides relief for about . Pt also takes daily 81mg  ASA. BP 123/80 with EMS then 90/50 post nitro with EMS. EKG with EMS WDL. Pt has long cardiac history. VSS.

## 2019-01-23 NOTE — ED Provider Notes (Signed)
[facility] Emergency Department Provider Note  Time seen: 4:31 PM  I have reviewed the triage vital signs and the nursing notes.   HISTORY  Chief Complaint Chest Pain   HPI [name] DEMICK Sr. is a 63 y.o. male with a past medical history of anxiety, CHF, COPD, chronic pain, gastric reflux, CAD status post CABG, presents to the emergency department for chest pain.  According to the patient over the past 4 days he has been experiencing intermittent left chest pain.  He states it is in the same location where he had pain when he had a heart attack in 2018.  Patient has been taking nitroglycerin with some relief although states the pain will return even after taking nitroglycerin.  Patient denies any fever.  Denies any cough.   Past Medical History:  Diagnosis Date  . Anxiety disorder   . Arthritis   . CAD (coronary artery disease)    a. ~ 2000 cath at [facility];  b. 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  . CHF (congestive heart failure) (HCC)   . Chronic back pain   . Chronic hip pain   . COPD (chronic obstructive pulmonary disease) (HCC)   . Depression   . Essential hypertension, benign   . GERD (gastroesophageal reflux disease)   . Headache(784.0)   . History of alcohol abuse   . History of cocaine abuse (HCC)   . Hyperlipidemia   . Obesity   . Pneumonia     Patient Active Problem List   Diagnosis Date Noted  . Respiratory failure (HCC) 10/22/2018  . COPD with acute exacerbation (HCC) 09/08/2018  . Hyperthyroidism 07/26/2018  . NSTEMI (non-ST elevated myocardial infarction) (HCC) 07/11/2015  . Chest pain 04/30/2015  . Syncope 04/30/2015  . Orthostatic syncope 04/30/2015  . Chronic back pain   . (HFpEF) heart failure with preserved ejection fraction (HCC)   . History of cocaine abuse (HCC)   . History of  alcohol abuse   . Prediabetes 08/12/2014  . Tobacco abuse 09/21/2013  . Sleep apnea- C-pap intol 09/14/2013  . Coronary atherosclerosis of native coronary artery   . OBESITY, MORBID 05/29/2008  . Dyslipidemia 01/19/2007  . ANXIETY DISORDER, GENERALIZED 01/19/2007  . Depression 10/20/2006  . Essential hypertension, benign 10/20/2006  . COPD (chronic obstructive pulmonary disease) (HCC) 10/20/2006    Past Surgical History:  Procedure Laterality Date  . BACK SURGERY    . CHOLECYSTECTOMY    . CORONARY ARTERY BYPASS GRAFT    . HIP SURGERY     rt  . JOINT REPLACEMENT    . LEFT HEART CATHETERIZATION WITH CORONARY ANGIOGRAM N/A 09/12/2013   Procedure: LEFT HEART CATHETERIZATION WITH CORONARY ANGIOGRAM;  Surgeon: Kathleene Hazel, MD;  Location: [facility] CATH LAB;  Service: Cardiovascular;  Laterality: N/A;  . NASAL/SINUS ENDOSCOPY    . TOTAL HIP ARTHROPLASTY  05/08/2012   Procedure: TOTAL HIP ARTHROPLASTY;  Surgeon: Nestor Lewandowsky, MD;  Location: [facility] OR;  Service: Orthopedics;  Laterality: Left;  . TOTAL HIP ARTHROPLASTY  2014 per pt    Prior to Admission medications   Medication Sig Start Date End Date Taking? Authorizing Provider  albuterol (PROVENTIL) (2.5 MG/3ML) 0.083% nebulizer solution Take 2.5 mg by nebulization every 6 (six) hours as needed for wheezing.    [provider]  aspirin 81  MG tablet Take 1 tablet (81 mg total) by mouth daily. 05/31/12   Kathlen Mody, MD  budesonide-formoterol (SYMBICORT) 160-4.5 MCG/ACT inhaler Inhale 2 puffs into the lungs 2 (two) times daily.    [provider]  buPROPion (WELLBUTRIN XL) 150 MG 24 hr tablet Take 150 mg by mouth daily. 07/15/18   [provider]  carvedilol (COREG) 3.125 MG tablet Take 1 tablet by mouth 2 (two) times daily. 02/21/18   [provider]  [employer] MELATONIN 3 MG TABS Take 1 tablet by mouth at bedtime.  03/22/18   [provider]  furosemide (LASIX) 20 MG tablet Take 40 mg by mouth every  morning.     [provider]  isosorbide mononitrate (IMDUR) 60 MG 24 hr tablet Take 60 mg by mouth daily. 06/22/18   [provider]  methimazole (TAPAZOLE) 10 MG tablet Take 20 mg by mouth daily. 07/21/18   [provider]  nitroGLYCERIN (NITROSTAT) 0.4 MG SL tablet Place 0.4 mg under the tongue every 5 (five) minutes as needed. For chest pain    [provider]  Olopatadine HCl (PATADAY) 0.2 % SOLN Apply 2 drops to eye 2 (two) times daily.    [provider]  omeprazole (PRILOSEC) 20 MG capsule Take 20 mg by mouth every morning.     [provider]  oxyCODONE-acetaminophen (PERCOCET) 10-325 MG tablet Take 1 tablet by mouth every 4 (four) hours as needed for pain.    [provider]  predniSONE (DELTASONE) 10 MG tablet Label  & dispense according to the schedule below. 5 Pills PO for 1 day then, 4 Pills PO for 1 day, 3 Pills PO for 1 day, 2 Pills PO for 1 day, 1 Pill PO for 1 days then STOP. Patient not taking: Reported on 10/22/2018 09/10/18   Houston Siren, MD  PROAIR HFA 108 (90 BASE) MCG/ACT inhaler Inhale 2 puffs into the lungs daily as needed for wheezing or shortness of breath.  06/20/13   [provider]  traZODone (DESYREL) 50 MG tablet Take 1-2 tablets by mouth at bedtime. 03/22/18   [provider]    Allergies  Allergen Reactions  . Ativan [Lorazepam] Other (See Comments)    "out of mind"  . Atorvastatin Other (See Comments)    Joint soreness  . Fluticasone Furoate-Vilanterol Swelling    Tongue swelling  . Haloperidol Decanoate   . Tiotropium Swelling    Per patient his tongue swells up   . Sulfamethoxazole-Trimethoprim Itching    Reports itching    Family History  Problem Relation Age of Onset  . Heart attack Mother   . Heart attack Brother   . Heart attack Sister   . Heart attack Father     Social History Social History   Tobacco Use  . Smoking status: Former Smoker    Packs/day:  0.50    Years: 39.00    Pack years: 19.50    Types: Cigarettes    Last attempt to quit: 09/15/2015    Years since quitting: 3.3  . Smokeless tobacco: Never Used  Substance Use Topics  . Alcohol use: Yes    Alcohol/week: 0.0 standard drinks    Comment: socially; reports drinking 2 beers last nigh  . Drug use: No    Review of Systems Constitutional: Negative for fever. Cardiovascular: Positive for chest pain, intermittent moderate left-sided.  Dull pain. Respiratory: No current shortness of breath. Gastrointestinal: Negative for abdominal pain, vomiting Genitourinary: Negative for urinary compaints  Musculoskeletal: Negative for musculoskeletal complaints Skin: Negative for skin complaints  Neurological: Negative for headache All other ROS negative  ____________________________________________   PHYSICAL EXAM:  VITAL SIGNS: ED Triage Vitals  Enc Vitals Group     BP 01/23/19 1606 102/79     Pulse Rate 01/23/19 1606 74     Resp 01/23/19 1606 20     Temp 01/23/19 1605 98 F (36.7 C)     Temp Source 01/23/19 1605 Oral     SpO2 01/23/19 1606 98 %     Weight 01/23/19 1605 (!) 345 lb (156.5 kg)     Height 01/23/19 1605  (1.905 m)     Head Circumference --      Peak Flow --      Pain Score --      Pain Loc --      Pain Edu? --      Excl. in GC? --    Constitutional: Alert and oriented. Well appearing and in no distress. Eyes: Normal exam ENT      Head: Normocephalic and atraumatic.      Mouth/Throat: Mucous membranes are moist. Cardiovascular: Normal rate, regular rhythm.  Respiratory: Normal respiratory effort without tachypnea nor retractions. Breath sounds are clear Gastrointestinal: Soft and nontender. No distention Musculoskeletal: Nontender with normal range of motion in all extremities.  Neurologic:  Normal speech and language. No gross focal neurologic deficits Skin:  Skin is warm, dry and intact.  Psychiatric: Mood and affect are normal.    ____________________________________________    EKG  EKG viewed and interpreted by myself shows normal sinus rhythm at 76 bpm with a narrow QRS, normal axis, largely normal intervals and nonspecific ST changes without ST elevation.  ____________________________________________    RADIOLOGY  Chest x-ray is negative  ____________________________________________   INITIAL IMPRESSION / ASSESSMENT AND PLAN / ED COURSE  Pertinent labs & imaging results that were available during my care of the patient were reviewed by me and considered in my medical decision making (see chart for details).   Patient presents to the emergency department for left-sided chest pain.  History of MI, CABG, follows up with [facility] cardiology.  Patient states the pain is been ongoing x4 days although intermittent.  Patient states he has not been taking his chronic pain medicine over the past 4 days either.  States he has taken nitroglycerin which has helped the pain but the pain returns.  We will check labs, chest x-ray we will swab for corona as a precaution while awaiting results.  We will treat pain once we can establish an IV.  Overall patient appears very well.  Labs are reassuring including a negative troponin.  Chest x-ray is negative.  Given 4 days of intermittent discomfort with negative troponin this is very reassuring, patient states he has not been taking his chronic pain medicine at home.  I instructed the patient to begin taking his pain medication once again and follow-up with his cardiologist tomorrow morning.  Patient agreeable to plan of care.  Provided my normal chest pain return precautions.  [name] GEISTER Sr. was evaluated in Emergency Department on 01/23/2019 for the symptoms described in the history of present illness. He was evaluated in the context of the global COVID-19 pandemic, which necessitated consideration that the patient might be at risk for infection with the SARS-CoV-2 virus that causes  COVID-19. Institutional protocols and algorithms that pertain to the evaluation of patients at risk for COVID-19 are in a state of rapid  change based on information released by regulatory bodies including the CDC and federal and state organizations. These policies and algorithms were followed during the patient's care in the ED.  ____________________________________________   FINAL CLINICAL IMPRESSION(S) / ED DIAGNOSES  Chest pain   Minna AntisPaduchowski, Rock Sobol, MD 01/23/19 Barry Brunner1935

## 2019-01-23 NOTE — ED Notes (Signed)
Carollee Herter, RN will attempt for ultrasound IV soon.

## 2019-01-23 NOTE — ED Notes (Signed)
EKG completed

## 2019-01-23 NOTE — ED Notes (Signed)
Pt refused to have [place], RN attempt for IV. Pt states history of needing ultrasound IV placement.

## 2019-01-23 NOTE — ED Notes (Signed)
Pt states no allergies to morphine/zofran.

## 2019-07-09 ENCOUNTER — Other Ambulatory Visit: Payer: Self-pay

## 2019-07-09 ENCOUNTER — Encounter: Payer: Self-pay | Admitting: Emergency Medicine

## 2019-07-09 ENCOUNTER — Emergency Department: Payer: Medicare Other

## 2019-07-09 ENCOUNTER — Emergency Department
Admission: EM | Admit: 2019-07-09 | Discharge: 2019-07-09 | Disposition: A | Discharge status: Home / Self Care | Payer: Medicare Other | Attending: Emergency Medicine | Admitting: Emergency Medicine

## 2019-07-09 DIAGNOSIS — I2510 Atherosclerotic heart disease of native coronary artery without angina pectoris: Secondary | ICD-10-CM | POA: Diagnosis not present

## 2019-07-09 DIAGNOSIS — J441 Chronic obstructive pulmonary disease with (acute) exacerbation: Secondary | ICD-10-CM | POA: Insufficient documentation

## 2019-07-09 DIAGNOSIS — Z87891 Personal history of nicotine dependence: Secondary | ICD-10-CM | POA: Insufficient documentation

## 2019-07-09 DIAGNOSIS — I509 Heart failure, unspecified: Secondary | ICD-10-CM | POA: Insufficient documentation

## 2019-07-09 DIAGNOSIS — R079 Chest pain, unspecified: Secondary | ICD-10-CM

## 2019-07-09 DIAGNOSIS — I110 Hypertensive heart disease with heart failure: Secondary | ICD-10-CM | POA: Insufficient documentation

## 2019-07-09 DIAGNOSIS — R05 Cough: Secondary | ICD-10-CM

## 2019-07-09 DIAGNOSIS — Z20828 Contact with and (suspected) exposure to other viral communicable diseases: Secondary | ICD-10-CM | POA: Diagnosis not present

## 2019-07-09 DIAGNOSIS — Z79899 Other long term (current) drug therapy: Secondary | ICD-10-CM | POA: Diagnosis not present

## 2019-07-09 DIAGNOSIS — Z951 Presence of aortocoronary bypass graft: Secondary | ICD-10-CM | POA: Insufficient documentation

## 2019-07-09 DIAGNOSIS — Z7982 Long term (current) use of aspirin: Secondary | ICD-10-CM | POA: Insufficient documentation

## 2019-07-09 DIAGNOSIS — R059 Cough, unspecified: Secondary | ICD-10-CM

## 2019-07-09 DIAGNOSIS — E039 Hypothyroidism, unspecified: Secondary | ICD-10-CM | POA: Diagnosis not present

## 2019-07-09 LAB — CBC
HCT: 40.2 % (ref 39.0–52.0)
Hemoglobin: 13.5 g/dL (ref 13.0–17.0)
MCH: 30.8 pg (ref 26.0–34.0)
MCHC: 33.6 g/dL (ref 30.0–36.0)
MCV: 91.8 fL (ref 80.0–100.0)
Platelets: 228 10*3/uL (ref 150–400)
RBC: 4.38 MIL/uL (ref 4.22–5.81)
RDW: 12.8 % (ref 11.5–15.5)
WBC: 9 10*3/uL (ref 4.0–10.5)
nRBC: 0 % (ref 0.0–0.2)

## 2019-07-09 LAB — BASIC METABOLIC PANEL
Anion gap: 13 (ref 5–15)
BUN: 15 mg/dL (ref 8–23)
CO2: 21 mmol/L — ABNORMAL LOW (ref 22–32)
Calcium: 9 mg/dL (ref 8.9–10.3)
Chloride: 105 mmol/L (ref 98–111)
Creatinine, Ser: 1.27 mg/dL — ABNORMAL HIGH (ref 0.61–1.24)
GFR calc Af Amer: >60 mL/min (ref >60)
GFR calc non Af Amer: >60 mL/min (ref >60)
Glucose, Bld: 104 mg/dL — ABNORMAL HIGH (ref 70–99)
Potassium: 3.5 mmol/L (ref 3.5–5.1)
Sodium: 139 mmol/L (ref 135–145)

## 2019-07-09 LAB — TROPONIN I (HIGH SENSITIVITY)
Troponin I (High Sensitivity): 4 ng/L (ref <18)
Troponin I (High Sensitivity): 4 ng/L (ref <18)

## 2019-07-09 MED ORDER — BENZONATATE 100 MG PO CAPS
200.0000 mg | ORAL_CAPSULE | Freq: Once | ORAL | Status: AC
  Administered 2019-07-09: 200 mg via ORAL
  Filled 2019-07-09: qty 2

## 2019-07-09 MED ORDER — PREDNISONE 10 MG (21) PO TBPK: ORAL_TABLET | ORAL | 0 refills | Status: DC

## 2019-07-09 MED ORDER — AZITHROMYCIN 500 MG PO TABS
500.0000 mg | ORAL_TABLET | Freq: Once | ORAL | Status: AC
  Administered 2019-07-09: 500 mg via ORAL
  Filled 2019-07-09: qty 1

## 2019-07-09 MED ORDER — OXYCODONE-ACETAMINOPHEN 5-325 MG PO TABS
2.0000 | ORAL_TABLET | Freq: Once | ORAL | Status: AC
  Administered 2019-07-09: 2 via ORAL
  Filled 2019-07-09: qty 2

## 2019-07-09 MED ORDER — AZITHROMYCIN 250 MG PO TABS: ORAL_TABLET | ORAL | 0 refills | Status: AC

## 2019-07-09 MED ORDER — PREDNISONE 20 MG PO TABS
60.0000 mg | ORAL_TABLET | Freq: Once | ORAL | Status: AC
  Administered 2019-07-09: 60 mg via ORAL
  Filled 2019-07-09: qty 3

## 2019-07-09 NOTE — ED Provider Notes (Signed)
[facility] Emergency Department Provider Note   ____________________________________________   I have reviewed the triage vital signs and the nursing notes.   HISTORY  Chief Complaint Cough and Chest Pain   History limited by: Not Limited   HPI [name] HALBIG Sr. is a 63 y.o. male who presents to the emergency department today because of concern for cough, shortness of breath and chest pain. The patient states his symptoms started 3 days ago. Have been constant since they started. Slightly worse at night. The patient does have history of COPD and CABG. Has been using his inhalers with only minimal relief. Cough is productive of yellow phlegm. The patient states that he has had similar symptoms in the past when he has had a pneumonia. Denies any fevers but has had some cold chills. Denies any leg swelling.    Records reviewed. Per medical record review patient has a history of CABG, recurrent chest pain per PCP note dated 06/05/2019.   Past Medical History:  Diagnosis Date  . Anxiety disorder   . Arthritis   . CAD (coronary artery disease)    a. ~ 2000 cath at [facility];  b. 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  . CHF (congestive heart failure) (HCC)   . Chronic back pain   . Chronic hip pain   . COPD (chronic obstructive pulmonary disease) (HCC)   . Depression   . Essential hypertension, benign   . GERD (gastroesophageal reflux disease)   . Headache(784.0)   . History of alcohol abuse   . History of cocaine abuse (HCC)   . Hyperlipidemia   . Obesity   . Pneumonia     Patient Active Problem List   Diagnosis Date Noted  . Respiratory failure (HCC) 10/22/2018  . COPD with acute exacerbation (HCC) 09/08/2018  . Hyperthyroidism 07/26/2018  . NSTEMI (non-ST elevated myocardial infarction) (HCC) 07/11/2015  .  Chest pain 04/30/2015  . Syncope 04/30/2015  . Orthostatic syncope 04/30/2015  . Chronic back pain   . (HFpEF) heart failure with preserved ejection fraction (HCC)   . History of cocaine abuse (HCC)   . History of alcohol abuse   . Prediabetes 08/12/2014  . Tobacco abuse 09/21/2013  . Sleep apnea- C-pap intol 09/14/2013  . Coronary atherosclerosis of native coronary artery   . OBESITY, MORBID 05/29/2008  . Dyslipidemia 01/19/2007  . ANXIETY DISORDER, GENERALIZED 01/19/2007  . Depression 10/20/2006  . Essential hypertension, benign 10/20/2006  . COPD (chronic obstructive pulmonary disease) (HCC) 10/20/2006    Past Surgical History:  Procedure Laterality Date  . BACK SURGERY    . CHOLECYSTECTOMY    . CORONARY ARTERY BYPASS GRAFT    . HIP SURGERY     rt  . JOINT REPLACEMENT    . LEFT HEART CATHETERIZATION WITH CORONARY ANGIOGRAM N/A 09/12/2013   Procedure: LEFT HEART CATHETERIZATION WITH CORONARY ANGIOGRAM;  Surgeon: Kathleene Hazel, MD;  Location: [facility] CATH LAB;  Service: Cardiovascular;  Laterality: N/A;  . NASAL/SINUS ENDOSCOPY    . TOTAL HIP ARTHROPLASTY  05/08/2012   Procedure: TOTAL HIP ARTHROPLASTY;  Surgeon: Nestor Lewandowsky, MD;  Location: [facility] OR;  Service: Orthopedics;  Laterality: Left;  . TOTAL HIP ARTHROPLASTY  2014 per pt    Prior to Admission medications   Medication Sig Start Date End Date Taking? Authorizing  Provider  albuterol (PROVENTIL) (2.5 MG/3ML) 0.083% nebulizer solution Take 2.5 mg by nebulization every 6 (six) hours as needed for wheezing.    [provider]  aspirin 81 MG tablet Take 1 tablet (81 mg total) by mouth daily. 05/31/12   Hosie Poisson, MD  budesonide-formoterol (SYMBICORT) 160-4.5 MCG/ACT inhaler Inhale 2 puffs into the lungs 2 (two) times daily.    [provider]  buPROPion (WELLBUTRIN XL) 150 MG 24 hr tablet Take 150 mg by mouth daily. 07/15/18   [provider]  carvedilol (COREG) 3.125 MG tablet Take 1 tablet  by mouth 2 (two) times daily. 02/21/18   [provider]  [employer] MELATONIN 3 MG TABS Take 1 tablet by mouth at bedtime.  03/22/18   [provider]  furosemide (LASIX) 20 MG tablet Take 40 mg by mouth every morning.     [provider]  isosorbide mononitrate (IMDUR) 60 MG 24 hr tablet Take 60 mg by mouth daily. 06/22/18   [provider]  methimazole (TAPAZOLE) 10 MG tablet Take 20 mg by mouth daily. 07/21/18   [provider]  nitroGLYCERIN (NITROSTAT) 0.4 MG SL tablet Place 0.4 mg under the tongue every 5 (five) minutes as needed. For chest pain    [provider]  Olopatadine HCl (PATADAY) 0.2 % SOLN Apply 2 drops to eye 2 (two) times daily.    [provider]  omeprazole (PRILOSEC) 20 MG capsule Take 20 mg by mouth every morning.     [provider]  oxyCODONE-acetaminophen (PERCOCET) 10-325 MG tablet Take 1 tablet by mouth every 4 (four) hours as needed for pain.    [provider]  predniSONE (DELTASONE) 10 MG tablet Label  & dispense according to the schedule below. 5 Pills PO for 1 day then, 4 Pills PO for 1 day, 3 Pills PO for 1 day, 2 Pills PO for 1 day, 1 Pill PO for 1 days then STOP. Patient not taking: Reported on 10/22/2018 09/10/18   Henreitta Leber, MD  PROAIR HFA 108 (90 BASE) MCG/ACT inhaler Inhale 2 puffs into the lungs daily as needed for wheezing or shortness of breath.  06/20/13   [provider]  traZODone (DESYREL) 50 MG tablet Take 1-2 tablets by mouth at bedtime. 03/22/18   [provider]    Allergies Fluticasone furoate-vilanterol, Tiotropium, Ativan [lorazepam], Haloperidol decanoate, Atorvastatin, and Sulfamethoxazole-trimethoprim  Family History  Problem Relation Age of Onset  . Heart attack Mother   . Heart attack Brother   . Heart attack Sister   . Heart attack Father     Social History Social History   Tobacco Use  . Smoking status: Former Smoker    Packs/day:  0.50    Years: 39.00    Pack years: 19.50    Types: Cigarettes    Quit date: 09/15/2015    Years since quitting: 3.8  . Smokeless tobacco: Never Used  Substance Use Topics  . Alcohol use: Yes    Alcohol/week: 0.0 standard drinks  . Drug use: No    Review of Systems Constitutional: No fever. Positive for chills.  Eyes: No visual changes. ENT: No sore throat. Cardiovascular: Positive for chest pain. Respiratory: Positive for cough and shortness of breath. Gastrointestinal: No abdominal pain.  No nausea, no vomiting.  No diarrhea.   Genitourinary: Negative for dysuria. Musculoskeletal: Negative for back pain. Skin: Negative for rash. Neurological: Negative for headaches, focal weakness or numbness.  ____________________________________________   PHYSICAL EXAM:  VITAL  SIGNS: ED Triage Vitals  Enc Vitals Group     BP 07/09/19 1455 132/90     Pulse Rate 07/09/19 1455 63     Resp 07/09/19 1455 20     Temp 07/09/19 1455 98.2 F (36.8 C)     Temp Source 07/09/19 1455 Oral     SpO2 07/09/19 1455 96 %     Weight 07/09/19 1456 (!) 352 lb (159.7 kg)     Height 07/09/19 1456 6\' 3"  (1.905 m)     Head Circumference --      Peak Flow --      Pain Score 07/09/19 1455 7   Constitutional: Alert and oriented.  Eyes: Conjunctivae are normal.  ENT      Head: Normocephalic and atraumatic.      Nose: No congestion/rhinnorhea.      Mouth/Throat: Mucous membranes are moist.      Neck: No stridor. Hematological/Lymphatic/Immunilogical: No cervical lymphadenopathy. Cardiovascular: Normal rate, regular rhythm.  No murmurs, rubs, or gallops. Respiratory: Normal respiratory effort without tachypnea nor retractions. Occasional cough. Diffuse expiratory wheezing.  Gastrointestinal: Soft and non tender. No rebound. No guarding.  Genitourinary: Deferred Musculoskeletal: Normal range of motion in all extremities. No lower extremity edema. Neurologic:  Normal speech and language. No gross focal  neurologic deficits are appreciated.  Skin:  Skin is warm, dry and intact. No rash noted. Psychiatric: Mood and affect are normal. Speech and behavior are normal. Patient exhibits appropriate insight and judgment.  ____________________________________________    LABS (pertinent positives/negatives)  Trop hs 4 x 2 CBC wbc 9.0, hgb 13.5, plt 228 BMP wnl except co2 21, glu 104, cr 1.27  ____________________________________________   EKG  I, Phineas Semen, attending physician, personally viewed and interpreted this EKG  EKG Time: 1459 Rate: 68 Rhythm: sinus rhythm Axis: normal Intervals: qtc 435 QRS: low voltage precordial leads ST changes: no st elevation Impression: abnormal ekg  ____________________________________________    RADIOLOGY  CXR No acute disease  ____________________________________________   PROCEDURES  Procedures  ____________________________________________   INITIAL IMPRESSION / ASSESSMENT AND PLAN / ED COURSE  Pertinent labs & imaging results that were available during my care of the patient were reviewed by me and considered in my medical decision making (see chart for details).   Patient presented to the emergency department today because of concern for shortness of breath and chest pain. Patient does have history of CAD however troponin negative times 2. CXR without pna or ptx. I doubt PE, dissection. At this time do think likely COPD exacerbation. Would consider COVID however think less likely given that patient has not had any known exposure and states he has been staying at home. Will plan on treating with antibiotics and steroids. Discussed findings and plan with patient.   ____________________________________________   FINAL CLINICAL IMPRESSION(S) / ED DIAGNOSES  Final diagnoses:  COPD exacerbation (HCC)  Cough     Note: This dictation was prepared with Dragon dictation. Any transcriptional errors that result from this process  are unintentional     Phineas Semen, MD 07/09/19 1902

## 2019-07-09 NOTE — ED Notes (Signed)
EDP, Goodman to bedside; update provided. 

## 2019-07-09 NOTE — ED Notes (Addendum)
Pt provided dinner tray upon request.

## 2019-07-09 NOTE — ED Triage Notes (Signed)
Pt in via [facility] from home, reports ongoing chest pain and cough x 2 days.  Vitals WDL, NAD noted at this time.

## 2019-07-09 NOTE — Discharge Instructions (Addendum)
Please seek medical attention for any high fevers, chest pain, shortness of breath, change in behavior, persistent vomiting, bloody stool or any other new or concerning symptoms.  

## 2019-07-10 LAB — SARS CORONAVIRUS 2 (TAT 6-24 HRS): SARS Coronavirus 2: NEGATIVE

## 2019-07-12 ENCOUNTER — Telehealth: Payer: Self-pay | Admitting: Family Medicine

## 2019-07-12 NOTE — Telephone Encounter (Signed)
Patient called in and received covid test result  °

## 2019-12-01 IMAGING — DX DG CHEST 1V PORT
1 series · 1 of 1 positions shown · non-contrast
Comparison: 04/26/2019

CLINICAL DATA: Chest pain and cough

EXAM:
PORTABLE CHEST 1 VIEW

[chest ap]
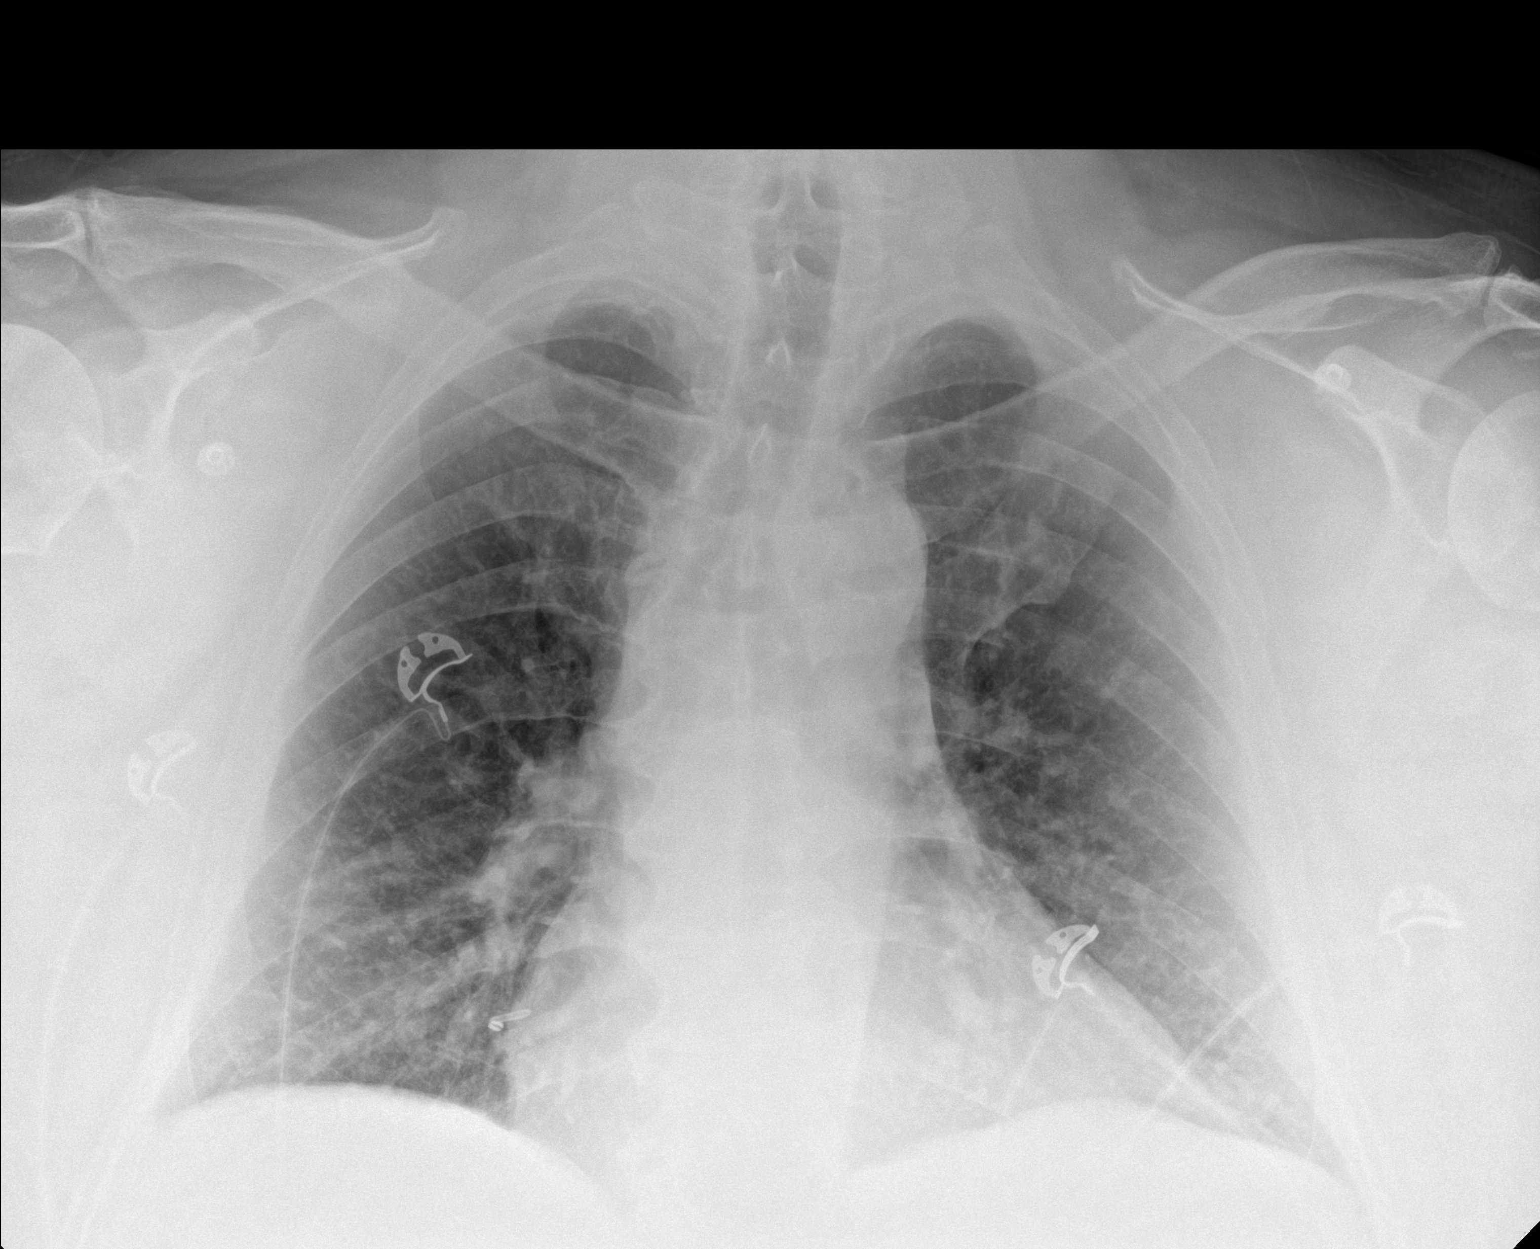

[1 of 1 positions shown; findings below may reference images not displayed]

FINDINGS: The heart size and mediastinal contours are within normal limits.
Both lungs are clear. The visualized skeletal structures are
unremarkable. Surgical clips overlying the right lung base.
IMPRESSION: No active disease.

## 2019-12-21 ENCOUNTER — Encounter: Payer: Self-pay | Admitting: Emergency Medicine

## 2019-12-21 ENCOUNTER — Other Ambulatory Visit: Payer: Self-pay

## 2019-12-21 ENCOUNTER — Emergency Department
Admission: EM | Admit: 2019-12-21 | Discharge: 2019-12-21 | Disposition: A | Discharge status: Home / Self Care | Payer: Medicare Other | Attending: Emergency Medicine | Admitting: Emergency Medicine

## 2019-12-21 ENCOUNTER — Emergency Department: Payer: Medicare Other

## 2019-12-21 DIAGNOSIS — Z96642 Presence of left artificial hip joint: Secondary | ICD-10-CM | POA: Insufficient documentation

## 2019-12-21 DIAGNOSIS — R0789 Other chest pain: Secondary | ICD-10-CM | POA: Insufficient documentation

## 2019-12-21 DIAGNOSIS — Z87891 Personal history of nicotine dependence: Secondary | ICD-10-CM | POA: Diagnosis not present

## 2019-12-21 DIAGNOSIS — Z7982 Long term (current) use of aspirin: Secondary | ICD-10-CM | POA: Diagnosis not present

## 2019-12-21 DIAGNOSIS — I2510 Atherosclerotic heart disease of native coronary artery without angina pectoris: Secondary | ICD-10-CM | POA: Diagnosis not present

## 2019-12-21 DIAGNOSIS — Z951 Presence of aortocoronary bypass graft: Secondary | ICD-10-CM | POA: Insufficient documentation

## 2019-12-21 DIAGNOSIS — Z79899 Other long term (current) drug therapy: Secondary | ICD-10-CM | POA: Insufficient documentation

## 2019-12-21 DIAGNOSIS — J449 Chronic obstructive pulmonary disease, unspecified: Secondary | ICD-10-CM | POA: Insufficient documentation

## 2019-12-21 DIAGNOSIS — I10 Essential (primary) hypertension: Secondary | ICD-10-CM | POA: Insufficient documentation

## 2019-12-21 LAB — CBC
HCT: 41.6 % (ref 39.0–52.0)
Hemoglobin: 13.9 g/dL (ref 13.0–17.0)
MCH: 31.7 pg (ref 26.0–34.0)
MCHC: 33.4 g/dL (ref 30.0–36.0)
MCV: 95 fL (ref 80.0–100.0)
Platelets: 250 10*3/uL (ref 150–400)
RBC: 4.38 MIL/uL (ref 4.22–5.81)
RDW: 13.2 % (ref 11.5–15.5)
WBC: 10.6 10*3/uL — ABNORMAL HIGH (ref 4.0–10.5)
nRBC: 0 % (ref 0.0–0.2)

## 2019-12-21 LAB — BASIC METABOLIC PANEL
Anion gap: 11 (ref 5–15)
BUN: 14 mg/dL (ref 8–23)
CO2: 20 mmol/L — ABNORMAL LOW (ref 22–32)
Calcium: 8.8 mg/dL — ABNORMAL LOW (ref 8.9–10.3)
Chloride: 107 mmol/L (ref 98–111)
Creatinine, Ser: 0.93 mg/dL (ref 0.61–1.24)
GFR calc Af Amer: >60 mL/min (ref >60)
GFR calc non Af Amer: >60 mL/min (ref >60)
Glucose, Bld: 187 mg/dL — ABNORMAL HIGH (ref 70–99)
Potassium: 4 mmol/L (ref 3.5–5.1)
Sodium: 138 mmol/L (ref 135–145)

## 2019-12-21 LAB — TROPONIN I (HIGH SENSITIVITY)
Troponin I (High Sensitivity): 4 ng/L (ref <18)
Troponin I (High Sensitivity): 3 ng/L (ref <18)

## 2019-12-21 MED ORDER — MORPHINE SULFATE (PF) 4 MG/ML IV SOLN
4.0000 mg | Freq: Once | INTRAVENOUS | Status: AC
  Administered 2019-12-21: 20:00:00 4 mg via INTRAVENOUS
  Filled 2019-12-21: qty 1

## 2019-12-21 MED ORDER — ALBUTEROL SULFATE (2.5 MG/3ML) 0.083% IN NEBU
2.5000 mg | INHALATION_SOLUTION | Freq: Once | RESPIRATORY_TRACT | Status: AC
  Administered 2019-12-21: 2.5 mg via RESPIRATORY_TRACT
  Filled 2019-12-21: qty 3

## 2019-12-21 NOTE — ED Notes (Signed)
Pt given ice chips at this time and cleared with MD Siadecki.

## 2019-12-21 NOTE — ED Triage Notes (Signed)
Pt presents to ED c/o L-sided chest pain and SOB starting today. Hx COPD, CABG

## 2019-12-21 NOTE — ED Provider Notes (Signed)
[facility] Emergency Department Provider Note ____________________________________________   First MD Initiated Contact with Patient 12/21/19 1915     (approximate)  I have reviewed the triage vital signs and the nursing notes.   HISTORY  Chief Complaint Chest Pain    HPI [name] Sr. is a 64 y.o. male with PMH as noted below including CAD status post CABG with several subsequent revisions, COPD, and CHF who presents with chest pain intermittently over the last 2 days, and more severely over the last several hours.  The patient states he started to have some discomfort in his chest a few days ago.  He was instructed by his doctor to stop taking his pain medication (for chronic pain) when this happens so that he can monitor his chest pain symptoms, so he did so.  He reports associated shortness of breath today.  He describes the pain as squeezing and pressure-like.  It is worse with changes in position.  Past Medical History:  Diagnosis Date  . Anxiety disorder   . Arthritis   . CAD (coronary artery disease)    a. ~ 2000 cath at [facility];  b. 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  . CHF (congestive heart failure) (HCC)   . Chronic back pain   . Chronic hip pain   . COPD (chronic obstructive pulmonary disease) (HCC)   . Depression   . Essential hypertension, benign   . GERD (gastroesophageal reflux disease)   . Headache(784.0)   . History of alcohol abuse   . History of cocaine abuse (HCC)   . Hyperlipidemia   . Obesity   . Pneumonia     Patient Active Problem List   Diagnosis Date Noted  . Respiratory failure (HCC) 10/22/2018  . COPD with acute exacerbation (HCC) 09/08/2018  . Hyperthyroidism 07/26/2018  . NSTEMI (non-ST elevated myocardial infarction) (HCC) 07/11/2015  . Chest pain 04/30/2015  .  Syncope 04/30/2015  . Orthostatic syncope 04/30/2015  . Chronic back pain   . (HFpEF) heart failure with preserved ejection fraction (HCC)   . History of cocaine abuse (HCC)   . History of alcohol abuse   . Prediabetes 08/12/2014  . Tobacco abuse 09/21/2013  . Sleep apnea- C-pap intol 09/14/2013  . Coronary atherosclerosis of native coronary artery   . OBESITY, MORBID 05/29/2008  . Dyslipidemia 01/19/2007  . ANXIETY DISORDER, GENERALIZED 01/19/2007  . Depression 10/20/2006  . Essential hypertension, benign 10/20/2006  . COPD (chronic obstructive pulmonary disease) (HCC) 10/20/2006    Past Surgical History:  Procedure Laterality Date  . BACK SURGERY    . CHOLECYSTECTOMY    . CORONARY ARTERY BYPASS GRAFT    . HIP SURGERY     rt  . JOINT REPLACEMENT    . LEFT HEART CATHETERIZATION WITH CORONARY ANGIOGRAM N/A 09/12/2013   Procedure: LEFT HEART CATHETERIZATION WITH CORONARY ANGIOGRAM;  Surgeon: Kathleene Hazel, MD;  Location: [facility] CATH LAB;  Service: Cardiovascular;  Laterality: N/A;  . NASAL/SINUS ENDOSCOPY    . TOTAL HIP ARTHROPLASTY  05/08/2012   Procedure: TOTAL HIP ARTHROPLASTY;  Surgeon: Nestor Lewandowsky, MD;  Location: [facility] OR;  Service: Orthopedics;  Laterality: Left;  . TOTAL HIP ARTHROPLASTY  2014 per pt    Prior to Admission medications   Medication Sig Start Date End Date Taking? Authorizing Provider  albuterol (PROVENTIL) (2.5 MG/3ML) 0.083% nebulizer solution Take 2.5 mg by nebulization every 6 (six) hours as needed for wheezing.    [provider]  aspirin 81 MG tablet Take 1 tablet (81 mg total) by mouth daily. 05/31/12   Hosie Poisson, MD  budesonide-formoterol (SYMBICORT) 160-4.5 MCG/ACT inhaler Inhale 2 puffs into the lungs 2 (two) times daily.    [provider]  buPROPion (WELLBUTRIN XL) 150 MG 24 hr tablet Take 150 mg by mouth daily. 07/15/18   [provider]  carvedilol (COREG) 3.125 MG tablet Take 1 tablet by mouth 2 (two) times  daily. 02/21/18   [provider]  [employer] MELATONIN 3 MG TABS Take 1 tablet by mouth at bedtime.  03/22/18   [provider]  furosemide (LASIX) 20 MG tablet Take 40 mg by mouth every morning.     [provider]  isosorbide mononitrate (IMDUR) 60 MG 24 hr tablet Take 60 mg by mouth daily. 06/22/18   [provider]  methimazole (TAPAZOLE) 10 MG tablet Take 20 mg by mouth daily. 07/21/18   [provider]  nitroGLYCERIN (NITROSTAT) 0.4 MG SL tablet Place 0.4 mg under the tongue every 5 (five) minutes as needed. For chest pain    [provider]  Olopatadine HCl (PATADAY) 0.2 % SOLN Apply 2 drops to eye 2 (two) times daily.    [provider]  omeprazole (PRILOSEC) 20 MG capsule Take 20 mg by mouth every morning.     [provider]  oxyCODONE-acetaminophen (PERCOCET) 10-325 MG tablet Take 1 tablet by mouth every 4 (four) hours as needed for pain.    [provider]  predniSONE (DELTASONE) 10 MG tablet Label  & dispense according to the schedule below. 5 Pills PO for 1 day then, 4 Pills PO for 1 day, 3 Pills PO for 1 day, 2 Pills PO for 1 day, 1 Pill PO for 1 days then STOP. Patient not taking: Reported on 10/22/2018 09/10/18   Henreitta Leber, MD  predniSONE (STERAPRED UNI-PAK 21 TAB) 10 MG (21) TBPK tablet Per packaging instructions 07/09/19   Nance Pear, MD  [facility] HFA 108 (90 BASE) MCG/ACT inhaler Inhale 2 puffs into the lungs daily as needed for wheezing or shortness of breath.  06/20/13   [provider]  traZODone (DESYREL) 50 MG tablet Take 1-2 tablets by mouth at bedtime. 03/22/18   [provider]    Allergies Fluticasone furoate-vilanterol, Tiotropium, Ativan [lorazepam], Haloperidol decanoate, Atorvastatin, and Sulfamethoxazole-trimethoprim  Family History  Problem Relation Age of Onset  . Heart attack Mother   . Heart attack Brother   . Heart attack Sister   . Heart attack Father      Social History Social History   Tobacco Use  . Smoking status: Former Smoker    Packs/day: 0.50    Years: 39.00    Pack years: 19.50    Types: Cigarettes    Quit date: 09/15/2015    Years since quitting: 4.2  . Smokeless tobacco: Never Used  Substance Use Topics  . Alcohol use: Yes    Alcohol/week: 0.0 standard drinks  . Drug use: No    Review of Systems  Constitutional: No fever. Eyes: No redness. ENT: No sore throat. Cardiovascular: Positive for chest pain. Respiratory: Positive for shortness of breath. Gastrointestinal: No vomiting or diarrhea.  Genitourinary: Negative for flank pain.  Musculoskeletal: Negative for back pain. Skin: Negative for rash. Neurological: Negative for headache.   ____________________________________________   PHYSICAL  EXAM:  VITAL SIGNS: ED Triage Vitals  Enc Vitals Group     BP 12/21/19 1823 132/67     Pulse Rate 12/21/19 1823 78     Resp 12/21/19 1823 (!) 24     Temp 12/21/19 1823 97.7 F (36.5 C)     Temp Source 12/21/19 1823 Axillary     SpO2 12/21/19 1823 96 %     Weight 12/21/19 1816 (!) 342 lb (155.1 kg)     Height 12/21/19 1816 6\' 3"  (1.905 m)     Head Circumference --      Peak Flow --      Pain Score 12/21/19 1816 7     Pain Loc --      Pain Edu? --      Excl. in GC? --     Constitutional: Alert and oriented.  Uncomfortable appearing but in no acute distress. Eyes: Conjunctivae are normal.  Head: Atraumatic. Nose: No congestion/rhinnorhea. Mouth/Throat: Mucous membranes are moist.   Neck: Normal range of motion.  Cardiovascular: Normal rate, regular rhythm. Grossly normal heart sounds.  Good peripheral circulation. Respiratory: Slightly increased respiratory effort.  No retractions.  Scattered wheezing bilaterally. Gastrointestinal: Soft and nontender. No distention.  Genitourinary: No flank tenderness. Musculoskeletal: No lower extremity edema.  Extremities warm and well perfused.  Neurologic:  Normal  speech and language. No gross focal neurologic deficits are appreciated.  Skin:  Skin is warm and dry. No rash noted. Psychiatric: Mood and affect are normal. Speech and behavior are normal.  ____________________________________________   LABS (all labs ordered are listed, but only abnormal results are displayed)  Labs Reviewed  BASIC METABOLIC PANEL - Abnormal; Notable for the following components:      Result Value   CO2 20 (*)    Glucose, Bld 187 (*)    Calcium 8.8 (*)    All other components within normal limits  CBC - Abnormal; Notable for the following components:   WBC 10.6 (*)    All other components within normal limits  TROPONIN I (HIGH SENSITIVITY)  TROPONIN I (HIGH SENSITIVITY)   ____________________________________________  EKG  ED ECG REPORT I, 12/23/19, the attending physician, personally viewed and interpreted this ECG.  Date: 12/21/2019 EKG Time: 1811 Rate: 78 Rhythm: normal sinus rhythm QRS Axis: normal Intervals: normal ST/T Wave abnormalities: normal Narrative Interpretation: no evidence of acute ischemia  ____________________________________________  RADIOLOGY  CXR: No focal infiltrate or other acute abnormality  ____________________________________________   PROCEDURES  Procedure(s) performed: No  Procedures  Critical Care performed: No ____________________________________________   INITIAL IMPRESSION / ASSESSMENT AND PLAN / ED COURSE  Pertinent labs & imaging results that were available during my care of the patient were reviewed by me and considered in my medical decision making (see chart for details).  64 year old male with PMH as noted above including CAD s/p CABG and multiple revisions, COPD, and CHF presents with chest pain over the last several days, worsened in the last few hours.  I reviewed the past medical records in epic.  The patient follows with cardiology at [facility].  He was most recently seen in the ED with  shortness of breath and chest pain about 6 months ago, diagnosed with likely COPD exacerbation.  Previously he was seen in June 2020 with a very similar presentation to today, in which she also stopped taking his pain medication.  He had a negative work-up at that time.  Given the normal EKG, negative troponin, and the prior presentations, overall I  have a low suspicion for ACS on this presentation.  However, the patient is certainly at elevated risk.  There is likely a component of COPD exacerbation as well.  Chest x-ray shows no focal infiltrate.  There is no clinical evidence for PE, nor for aortic dissection or other vascular etiology.  We will give analgesia, albuterol treatment, obtain repeat troponin, and reassess.  ----------------------------------------- 10:13 PM on 12/21/2019 -----------------------------------------  Initial and repeat troponins were both negative.  The patient had significant relief from the pain after analgesia and the albuterol.  He was feeling much better.  I discussed the results of the work-up with the patient.  Since his troponins are negative, he is currently chest pain-free, and has had no EKG changes, my suspicion for ACS is low.  On further history with the patient, he describes multiple prior similar episodes that have resulted in negative work-ups.  Given his elevated cardiac risk, I offered him admission for observation and serial enzymes, however I feel it is reasonable for him to go home given the negative work-up.  He expressed a strong preference to go home.  I given thorough return precautions and he expressed understanding. ____________________________________________   FINAL CLINICAL IMPRESSION(S) / ED DIAGNOSES  Final diagnoses:  Atypical chest pain      NEW MEDICATIONS STARTED DURING THIS VISIT:  Discharge Medication List as of 12/21/2019 10:12 PM       Note:  This document was prepared using Dragon voice recognition software and may  include unintentional dictation errors.    Dionne Bucy, MD 12/21/19 2358

## 2019-12-21 NOTE — Discharge Instructions (Addendum)
Continue take your usual pain medication for your chronic pain.  Contact your cardiologist on Monday to schedule follow-up.  Return to the ER for new, worsening, or persistent severe chest pain, difficulty breathing, weakness or lightheadedness, or any other new or worsening symptoms that concern you.

## 2020-03-02 ENCOUNTER — Emergency Department: Payer: Medicare Other

## 2020-03-02 ENCOUNTER — Other Ambulatory Visit: Payer: Self-pay

## 2020-03-02 ENCOUNTER — Emergency Department
Admission: EM | Admit: 2020-03-02 | Discharge: 2020-03-02 | Disposition: A | Discharge status: Home / Self Care | Payer: Medicare Other | Attending: Emergency Medicine | Admitting: Emergency Medicine

## 2020-03-02 DIAGNOSIS — R600 Localized edema: Secondary | ICD-10-CM | POA: Diagnosis not present

## 2020-03-02 DIAGNOSIS — Z5321 Procedure and treatment not carried out due to patient leaving prior to being seen by health care provider: Secondary | ICD-10-CM | POA: Insufficient documentation

## 2020-03-02 DIAGNOSIS — R0789 Other chest pain: Secondary | ICD-10-CM | POA: Diagnosis present

## 2020-03-02 LAB — BASIC METABOLIC PANEL
Anion gap: 9 (ref 5–15)
BUN: 13 mg/dL (ref 8–23)
CO2: 21 mmol/L — ABNORMAL LOW (ref 22–32)
Calcium: 8.9 mg/dL (ref 8.9–10.3)
Chloride: 108 mmol/L (ref 98–111)
Creatinine, Ser: 1.22 mg/dL (ref 0.61–1.24)
GFR calc Af Amer: >60 mL/min (ref >60)
GFR calc non Af Amer: >60 mL/min (ref >60)
Glucose, Bld: 88 mg/dL (ref 70–99)
Potassium: 3.9 mmol/L (ref 3.5–5.1)
Sodium: 138 mmol/L (ref 135–145)

## 2020-03-02 LAB — CBC
HCT: 40.5 % (ref 39.0–52.0)
Hemoglobin: 13.9 g/dL (ref 13.0–17.0)
MCH: 31.9 pg (ref 26.0–34.0)
MCHC: 34.3 g/dL (ref 30.0–36.0)
MCV: 92.9 fL (ref 80.0–100.0)
Platelets: 253 10*3/uL (ref 150–400)
RBC: 4.36 MIL/uL (ref 4.22–5.81)
RDW: 12.8 % (ref 11.5–15.5)
WBC: 9.8 10*3/uL (ref 4.0–10.5)
nRBC: 0 % (ref 0.0–0.2)

## 2020-03-02 LAB — TROPONIN I (HIGH SENSITIVITY)
Troponin I (High Sensitivity): 5 ng/L (ref <18)
Troponin I (High Sensitivity): 4 ng/L (ref <18)

## 2020-03-02 LAB — BRAIN NATRIURETIC PEPTIDE: B Natriuretic Peptide: 62.8 pg/mL (ref 0.0–100.0)

## 2020-03-02 MED ORDER — OXYCODONE-ACETAMINOPHEN 5-325 MG PO TABS: 2.0000 | ORAL_TABLET | Freq: Once | ORAL | Status: DC

## 2020-03-02 MED ORDER — FENTANYL CITRATE (PF) 100 MCG/2ML IJ SOLN
50.0000 ug | INTRAMUSCULAR | Status: DC | PRN
  Administered 2020-03-02: 50 ug via INTRAVENOUS
  Filled 2020-03-02: qty 2

## 2020-03-02 NOTE — ED Notes (Signed)
Patient ambulatory to stat desk stating that he wants to leave at this time. Patient encouraged to stay and explained about the repeat troponin. Patient agrees to stay at this time.

## 2020-03-02 NOTE — ED Notes (Signed)
Patient requesting to have hi IV taken out and to go home. Patient encouraged to stay and informed that I had received and order for percocet. Patient states that he still does not want to wait. Patient informed that he is not allowed to drive after receiving Fentanyl. Patient called his son to come pick him up.

## 2020-03-02 NOTE — ED Notes (Signed)
Patient states his chest pain has improved but that his chronic hip pain is worse. Patient states that he takes percocet for his pain. Order received from Dr. Scotty Court for the patient to have percocet.

## 2020-03-02 NOTE — ED Triage Notes (Signed)
Pt arrives to ER c/o of L sided CP with L arm pain. Also c/o swelling to top of L foot. Extensive cardiac hx. Takes fluid pills. Denies missing any doses. A&O, in wheelchair. States nausea and cold sweats.

## 2020-03-02 NOTE — ED Notes (Signed)
Rainbow sent to lab

## 2020-03-02 NOTE — ED Notes (Signed)
VS obtained by this RN. Pt visualized in NAD. Pt visualized intermittently ambulatory around the lobby at this time, pt drank Dr. Reino Kent without difficulty. Pt stating that he was thinking about leaving, this RN encouraged patient to wait, pt states understanding, states "I think I should stay", this RN agreed with patient and encouraged patient to stay and be seen by EDP.

## 2020-03-03 ENCOUNTER — Telehealth: Payer: Self-pay | Admitting: Emergency Medicine

## 2020-03-03 NOTE — Telephone Encounter (Addendum)
Called patient due to lwot to inquire about condition and follow up plans. Left message    Patient called me back.  I encouraged him to call his cardiologist to let them know what his symptoms were.  I told him that the labwork would not prove that this is not his heart.  I told him the importance of exam by a doctor to determine a diagnosis.  He agrees to call them.

## 2020-03-07 ENCOUNTER — Emergency Department
Admission: EM | Admit: 2020-03-07 | Discharge: 2020-03-07 | Disposition: A | Discharge status: Home / Self Care | Payer: Medicare Other | Attending: Emergency Medicine | Admitting: Emergency Medicine

## 2020-03-07 ENCOUNTER — Other Ambulatory Visit: Payer: Self-pay

## 2020-03-07 ENCOUNTER — Emergency Department: Payer: Medicare Other

## 2020-03-07 DIAGNOSIS — Z955 Presence of coronary angioplasty implant and graft: Secondary | ICD-10-CM | POA: Insufficient documentation

## 2020-03-07 DIAGNOSIS — M79602 Pain in left arm: Secondary | ICD-10-CM | POA: Insufficient documentation

## 2020-03-07 DIAGNOSIS — J449 Chronic obstructive pulmonary disease, unspecified: Secondary | ICD-10-CM | POA: Insufficient documentation

## 2020-03-07 DIAGNOSIS — R079 Chest pain, unspecified: Secondary | ICD-10-CM | POA: Diagnosis present

## 2020-03-07 DIAGNOSIS — Z96642 Presence of left artificial hip joint: Secondary | ICD-10-CM | POA: Insufficient documentation

## 2020-03-07 DIAGNOSIS — I509 Heart failure, unspecified: Secondary | ICD-10-CM | POA: Insufficient documentation

## 2020-03-07 DIAGNOSIS — I2510 Atherosclerotic heart disease of native coronary artery without angina pectoris: Secondary | ICD-10-CM | POA: Insufficient documentation

## 2020-03-07 DIAGNOSIS — M79605 Pain in left leg: Secondary | ICD-10-CM | POA: Insufficient documentation

## 2020-03-07 DIAGNOSIS — I110 Hypertensive heart disease with heart failure: Secondary | ICD-10-CM | POA: Insufficient documentation

## 2020-03-07 LAB — CBC
HCT: 39.2 % (ref 39.0–52.0)
Hemoglobin: 13.9 g/dL (ref 13.0–17.0)
MCH: 32.5 pg (ref 26.0–34.0)
MCHC: 35.5 g/dL (ref 30.0–36.0)
MCV: 91.6 fL (ref 80.0–100.0)
Platelets: 232 10*3/uL (ref 150–400)
RBC: 4.28 MIL/uL (ref 4.22–5.81)
RDW: 13 % (ref 11.5–15.5)
WBC: 10.2 10*3/uL (ref 4.0–10.5)
nRBC: 0 % (ref 0.0–0.2)

## 2020-03-07 LAB — TROPONIN I (HIGH SENSITIVITY)
Troponin I (High Sensitivity): 3 ng/L (ref <18)
Troponin I (High Sensitivity): 4 ng/L (ref <18)

## 2020-03-07 LAB — BASIC METABOLIC PANEL
Anion gap: 10 (ref 5–15)
BUN: 15 mg/dL (ref 8–23)
CO2: 23 mmol/L (ref 22–32)
Calcium: 9.1 mg/dL (ref 8.9–10.3)
Chloride: 105 mmol/L (ref 98–111)
Creatinine, Ser: 1.39 mg/dL — ABNORMAL HIGH (ref 0.61–1.24)
GFR calc Af Amer: >60 mL/min (ref >60)
GFR calc non Af Amer: 54 mL/min — ABNORMAL LOW (ref >60)
Glucose, Bld: 143 mg/dL — ABNORMAL HIGH (ref 70–99)
Potassium: 3.5 mmol/L (ref 3.5–5.1)
Sodium: 138 mmol/L (ref 135–145)

## 2020-03-07 LAB — PROTIME-INR
INR: 1 (ref 0.8–1.2)
Prothrombin Time: 13.2 seconds (ref 11.4–15.2)

## 2020-03-07 MED ORDER — HYDROMORPHONE HCL 1 MG/ML IJ SOLN
1.0000 mg | Freq: Once | INTRAMUSCULAR | Status: AC
  Administered 2020-03-07: 1 mg via INTRAVENOUS
  Filled 2020-03-07: qty 1

## 2020-03-07 MED ORDER — IOHEXOL 350 MG/ML SOLN
100.0000 mL | Freq: Once | INTRAVENOUS | Status: AC | PRN
  Administered 2020-03-07: 100 mL via INTRAVENOUS

## 2020-03-07 MED ORDER — SODIUM CHLORIDE 0.9% FLUSH: 3.0000 mL | Freq: Once | INTRAVENOUS | Status: DC

## 2020-03-07 MED ORDER — HYDROMORPHONE HCL 1 MG/ML IJ SOLN
1.0000 mg | Freq: Once | INTRAMUSCULAR | Status: DC
  Filled 2020-03-07: qty 1

## 2020-03-07 MED ORDER — OXYCODONE-ACETAMINOPHEN 5-325 MG PO TABS: 2.0000 | ORAL_TABLET | Freq: Once | ORAL | Status: DC

## 2020-03-07 NOTE — ED Provider Notes (Signed)
ER Provider Note       Time seen: 8:11 PM    I have reviewed the vital signs and the nursing notes.  HISTORY   Chief Complaint Chest Pain   HPI [name] HALLAS Sr. is a 64 y.o. male with a history of anxiety, arthritis, coronary disease, CHF, chronic pain, COPD, hypertension, hyperlipidemia who presents today for chest pain, left arm and left leg pain when laying down for the last 3 days.  He is also had some shortness of breath.  Was seen for similar 5 days ago and was discharged.  Pain is 7 out of 10.  Past Medical History:  Diagnosis Date  . Anxiety disorder   . Arthritis   . CAD (coronary artery disease)    a. ~ 2000 cath at [facility];  b. 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  . CHF (congestive heart failure) (HCC)   . Chronic back pain   . Chronic hip pain   . COPD (chronic obstructive pulmonary disease) (HCC)   . Depression   . Essential hypertension, benign   . GERD (gastroesophageal reflux disease)   . Headache(784.0)   . History of alcohol abuse   . History of cocaine abuse (HCC)   . Hyperlipidemia   . Obesity   . Pneumonia     Past Surgical History:  Procedure Laterality Date  . BACK SURGERY    . CHOLECYSTECTOMY    . CORONARY ARTERY BYPASS GRAFT    . HIP SURGERY     rt  . JOINT REPLACEMENT    . LEFT HEART CATHETERIZATION WITH CORONARY ANGIOGRAM N/A 09/12/2013   Procedure: LEFT HEART CATHETERIZATION WITH CORONARY ANGIOGRAM;  Surgeon: Kathleene Hazel, MD;  Location: [facility] CATH LAB;  Service: Cardiovascular;  Laterality: N/A;  . NASAL/SINUS ENDOSCOPY    . TOTAL HIP ARTHROPLASTY  05/08/2012   Procedure: TOTAL HIP ARTHROPLASTY;  Surgeon: Nestor Lewandowsky, MD;  Location: [facility] OR;  Service: Orthopedics;  Laterality: Left;  . TOTAL HIP ARTHROPLASTY  2014 per pt    Allergies Fluticasone furoate-vilanterol, Tiotropium, Ativan  [lorazepam], Haloperidol decanoate, Atorvastatin, and Sulfamethoxazole-trimethoprim  Review of Systems Constitutional: Negative for fever. Cardiovascular: Positive for chest pain Respiratory: Negative for shortness of breath. Gastrointestinal: Negative for abdominal pain, vomiting and diarrhea. Musculoskeletal: Positive for left arm and left leg pain Skin: Negative for rash. Neurological: Negative for headaches, focal weakness or numbness.  All systems negative/normal/unremarkable except as stated in the HPI  ____________________________________________   PHYSICAL EXAM:  VITAL SIGNS: Vitals:   03/07/20 1720 03/07/20 1952  BP: 120/73 132/75  Pulse: 69 64  Resp: 18 18  Temp: 98 F (36.7 C) 97.8 F (36.6 C)  SpO2: 96% 98%    Constitutional: Alert and oriented. Well appearing and in no distress. Eyes: Conjunctivae are normal. Normal extraocular movements. Cardiovascular: Normal rate, regular rhythm. No murmurs, rubs, or gallops. Respiratory: Normal respiratory effort without tachypnea nor retractions. Breath sounds are clear and equal bilaterally. No wheezes/rales/rhonchi. Gastrointestinal: Soft and nontender. Normal bowel sounds Musculoskeletal: Nontender with normal range of motion in extremities. No lower extremity tenderness nor edema. Neurologic:  Normal speech and language. No gross focal neurologic deficits are appreciated.  Skin:  Skin is warm, dry and intact. No rash noted. Psychiatric: Speech and behavior are normal.  ____________________________________________  EKG: Interpreted by me.  Sinus  rhythm with rate of 72 bpm, low voltage QRS, incomplete right bundle branch block, normal QT  ____________________________________________   LABS (pertinent positives/negatives)  Labs Reviewed  BASIC METABOLIC PANEL - Abnormal; Notable for the following components:      Result Value   Glucose, Bld 143 (*)    Creatinine, Ser 1.39 (*)    GFR calc non Af Amer 54 (*)     All other components within normal limits  CBC  PROTIME-INR  TROPONIN I (HIGH SENSITIVITY)  TROPONIN I (HIGH SENSITIVITY)    RADIOLOGY  Images were viewed by me CXR IMPRESSION: 1. No acute abnormality. 2. Stable mild cardiomegaly and hyperinflation. IMPRESSION: 1. Slightly suboptimal opacification of main pulmonary artery, no central or segmental embolism. 2.  Emphysema (ICD10-J43.9). 3. Aortic Atherosclerosis (ICD10-I70.0). 4. Small hiatal hernia.   DIFFERENTIAL DIAGNOSIS  Musculoskeletal pain, GERD, anxiety, unstable angina, MI, PE  ASSESSMENT AND PLAN  Chest pain   Plan: The patient had presented for nonspecific chest pain. Patient's labs do not reveal any acute process and are reassuring.  Repeat troponin testing was negative and CT angiogram of the chest was negative.  He is encouraged to have close outpatient follow-up with his cardiologist  Daryel November MD    Note: This note was generated in part or whole with voice recognition software. Voice recognition is usually quite accurate but there are transcription errors that can and very often do occur. I apologize for any typographical errors that were not detected and corrected.     Emily Filbert, MD 03/07/20 2202

## 2020-03-07 NOTE — ED Triage Notes (Signed)
Pt arrives via POV for chest pain, left arm and leg pain and shob when laying down x 3 days. Pt seen here for the same on 07/11 and was discharged. Pt reports CP 7/10, skin warm and dry. Pt speaking in complete sentences without shob.

## 2020-03-07 NOTE — ED Notes (Addendum)
Patient refused the Dilaudid at 22:25 when told that his ride would have to arrive and let the nurse know prior to the patient being discharged. Patient at that time refused the Dilaudid and wanted to be taken to the ED "circle" to wait for his son. Patient was told due to the first administration of Dilaudid was given at 2046, his son would still need to come in. Patient was left at that time so this writer could assist another patient. At approximately 2244, patient requested the Dilaudid. When this nurse came with the Dilaudid and verified that patient would have a ride and the ride would let the First nurse or this RN know when they arrived, patient states his son called for a "hoover" which would be here at 2300 and that the patient wanted the Dilaudid, then have the IV and leads removed and bring the patient to the ED "circle" to wait for the "hoover." When the patient was reminded of the policy, the patient refused the Dilaudid, took all leads off and got dressed. This RN removed the IV and brought to the patient to the lobby to call for his ride. Patient refused to stay inside and got out of the wheelchair and left. Patient was seen just outside the ED doors on his phone. Security Officer aware that patient needs a ride before leaving. Patient was loud and belligerent to this Clinical research associate. Dr. Mayford Knife aware.

## 2020-03-07 NOTE — ED Notes (Signed)
Patient given bottle water per Dr. Mayford Knife.

## 2020-03-07 NOTE — ED Notes (Signed)
Patient is resting on stretcher, looking at phone and drinking the water bottle. NAD.

## 2020-03-07 NOTE — ED Notes (Signed)
Patient to stat desk in no acute distress asking about wait time. Patient given an update on wait time. Patient verbalizes understanding.  

## 2020-04-03 ENCOUNTER — Emergency Department
Admission: EM | Admit: 2020-04-03 | Discharge: 2020-04-03 | Disposition: A | Discharge status: Home / Self Care | Payer: Medicare Other | Attending: Emergency Medicine | Admitting: Emergency Medicine

## 2020-04-03 ENCOUNTER — Other Ambulatory Visit: Payer: Self-pay

## 2020-04-03 ENCOUNTER — Emergency Department: Payer: Medicare Other

## 2020-04-03 DIAGNOSIS — R197 Diarrhea, unspecified: Secondary | ICD-10-CM | POA: Diagnosis not present

## 2020-04-03 DIAGNOSIS — K625 Hemorrhage of anus and rectum: Secondary | ICD-10-CM | POA: Diagnosis not present

## 2020-04-03 DIAGNOSIS — Z5321 Procedure and treatment not carried out due to patient leaving prior to being seen by health care provider: Secondary | ICD-10-CM | POA: Insufficient documentation

## 2020-04-03 DIAGNOSIS — R1030 Lower abdominal pain, unspecified: Secondary | ICD-10-CM | POA: Diagnosis present

## 2020-04-03 LAB — COMPREHENSIVE METABOLIC PANEL
ALT: 17 U/L (ref 0–44)
AST: 20 U/L (ref 15–41)
Albumin: 3.9 g/dL (ref 3.5–5.0)
Alkaline Phosphatase: 55 U/L (ref 38–126)
Anion gap: 12 (ref 5–15)
BUN: 17 mg/dL (ref 8–23)
CO2: 22 mmol/L (ref 22–32)
Calcium: 8.8 mg/dL — ABNORMAL LOW (ref 8.9–10.3)
Chloride: 103 mmol/L (ref 98–111)
Creatinine, Ser: 1.22 mg/dL (ref 0.61–1.24)
GFR calc Af Amer: >60 mL/min (ref >60)
GFR calc non Af Amer: >60 mL/min (ref >60)
Glucose, Bld: 122 mg/dL — ABNORMAL HIGH (ref 70–99)
Potassium: 3.2 mmol/L — ABNORMAL LOW (ref 3.5–5.1)
Sodium: 137 mmol/L (ref 135–145)
Total Bilirubin: 0.8 mg/dL (ref 0.3–1.2)
Total Protein: 7.6 g/dL (ref 6.5–8.1)

## 2020-04-03 LAB — TYPE AND SCREEN
ABO/RH(D): A NEG
Antibody Screen: NEGATIVE

## 2020-04-03 LAB — CBC
HCT: 38.6 % — ABNORMAL LOW (ref 39.0–52.0)
Hemoglobin: 13 g/dL (ref 13.0–17.0)
MCH: 31.7 pg (ref 26.0–34.0)
MCHC: 33.7 g/dL (ref 30.0–36.0)
MCV: 94.1 fL (ref 80.0–100.0)
Platelets: 190 10*3/uL (ref 150–400)
RBC: 4.1 MIL/uL — ABNORMAL LOW (ref 4.22–5.81)
RDW: 12.9 % (ref 11.5–15.5)
WBC: 10.3 10*3/uL (ref 4.0–10.5)
nRBC: 0 % (ref 0.0–0.2)

## 2020-04-03 LAB — TROPONIN I (HIGH SENSITIVITY): Troponin I (High Sensitivity): 5 ng/L (ref <18)

## 2020-04-03 NOTE — ED Triage Notes (Signed)
Patient c/o mid abdominal pain radiating into chest. Patient c/o rectal bleeding beginning Tuesday.

## 2020-04-03 NOTE — ED Triage Notes (Signed)
First Nurse Note:  Arrives c/o lower abdominal pain and diarrhea x 3 days.

## 2020-06-20 DIAGNOSIS — M47816 Spondylosis without myelopathy or radiculopathy, lumbar region: Secondary | ICD-10-CM | POA: Diagnosis not present

## 2020-06-20 DIAGNOSIS — M5136 Other intervertebral disc degeneration, lumbar region: Secondary | ICD-10-CM | POA: Diagnosis not present

## 2020-06-20 DIAGNOSIS — Z79899 Other long term (current) drug therapy: Secondary | ICD-10-CM | POA: Diagnosis not present

## 2020-06-20 DIAGNOSIS — G894 Chronic pain syndrome: Secondary | ICD-10-CM | POA: Diagnosis not present

## 2020-06-20 DIAGNOSIS — M791 Myalgia, unspecified site: Secondary | ICD-10-CM | POA: Diagnosis not present

## 2020-07-19 ENCOUNTER — Encounter: Payer: Self-pay | Admitting: Radiology

## 2020-07-19 ENCOUNTER — Emergency Department: Payer: Medicare Other

## 2020-07-19 ENCOUNTER — Other Ambulatory Visit: Payer: Self-pay

## 2020-07-19 ENCOUNTER — Emergency Department
Admission: EM | Admit: 2020-07-19 | Discharge: 2020-07-19 | Disposition: A | Discharge status: Home / Self Care | Payer: Medicare Other | Attending: Emergency Medicine | Admitting: Emergency Medicine

## 2020-07-19 DIAGNOSIS — Z7982 Long term (current) use of aspirin: Secondary | ICD-10-CM | POA: Diagnosis not present

## 2020-07-19 DIAGNOSIS — Z9049 Acquired absence of other specified parts of digestive tract: Secondary | ICD-10-CM | POA: Diagnosis not present

## 2020-07-19 DIAGNOSIS — Z79899 Other long term (current) drug therapy: Secondary | ICD-10-CM | POA: Insufficient documentation

## 2020-07-19 DIAGNOSIS — R062 Wheezing: Secondary | ICD-10-CM | POA: Insufficient documentation

## 2020-07-19 DIAGNOSIS — I509 Heart failure, unspecified: Secondary | ICD-10-CM | POA: Insufficient documentation

## 2020-07-19 DIAGNOSIS — R0602 Shortness of breath: Secondary | ICD-10-CM | POA: Insufficient documentation

## 2020-07-19 DIAGNOSIS — Z87891 Personal history of nicotine dependence: Secondary | ICD-10-CM | POA: Insufficient documentation

## 2020-07-19 DIAGNOSIS — R079 Chest pain, unspecified: Secondary | ICD-10-CM | POA: Insufficient documentation

## 2020-07-19 DIAGNOSIS — J441 Chronic obstructive pulmonary disease with (acute) exacerbation: Secondary | ICD-10-CM | POA: Diagnosis not present

## 2020-07-19 DIAGNOSIS — Z951 Presence of aortocoronary bypass graft: Secondary | ICD-10-CM | POA: Insufficient documentation

## 2020-07-19 DIAGNOSIS — M7989 Other specified soft tissue disorders: Secondary | ICD-10-CM | POA: Insufficient documentation

## 2020-07-19 DIAGNOSIS — Z96642 Presence of left artificial hip joint: Secondary | ICD-10-CM | POA: Insufficient documentation

## 2020-07-19 DIAGNOSIS — I110 Hypertensive heart disease with heart failure: Secondary | ICD-10-CM | POA: Diagnosis not present

## 2020-07-19 DIAGNOSIS — R0789 Other chest pain: Secondary | ICD-10-CM | POA: Diagnosis not present

## 2020-07-19 DIAGNOSIS — I2510 Atherosclerotic heart disease of native coronary artery without angina pectoris: Secondary | ICD-10-CM | POA: Insufficient documentation

## 2020-07-19 LAB — BASIC METABOLIC PANEL
Anion gap: 11 (ref 5–15)
BUN: 23 mg/dL (ref 8–23)
CO2: 21 mmol/L — ABNORMAL LOW (ref 22–32)
Calcium: 9.2 mg/dL (ref 8.9–10.3)
Chloride: 104 mmol/L (ref 98–111)
Creatinine, Ser: 1.21 mg/dL (ref 0.61–1.24)
GFR, Estimated: >60 mL/min (ref >60)
Glucose, Bld: 155 mg/dL — ABNORMAL HIGH (ref 70–99)
Potassium: 3.6 mmol/L (ref 3.5–5.1)
Sodium: 136 mmol/L (ref 135–145)

## 2020-07-19 LAB — CBC
HCT: 42.3 % (ref 39.0–52.0)
Hemoglobin: 14.3 g/dL (ref 13.0–17.0)
MCH: 31.4 pg (ref 26.0–34.0)
MCHC: 33.8 g/dL (ref 30.0–36.0)
MCV: 92.8 fL (ref 80.0–100.0)
Platelets: 231 10*3/uL (ref 150–400)
RBC: 4.56 MIL/uL (ref 4.22–5.81)
RDW: 12.9 % (ref 11.5–15.5)
WBC: 9.5 10*3/uL (ref 4.0–10.5)
nRBC: 0 % (ref 0.0–0.2)

## 2020-07-19 LAB — TROPONIN I (HIGH SENSITIVITY)
Troponin I (High Sensitivity): 5 ng/L (ref <18)
Troponin I (High Sensitivity): 5 ng/L (ref <18)

## 2020-07-19 MED ORDER — MORPHINE SULFATE (PF) 4 MG/ML IV SOLN
4.0000 mg | Freq: Once | INTRAVENOUS | Status: AC
  Administered 2020-07-19: 4 mg via INTRAVENOUS
  Filled 2020-07-19: qty 1

## 2020-07-19 MED ORDER — IOHEXOL 350 MG/ML SOLN
100.0000 mL | Freq: Once | INTRAVENOUS | Status: AC | PRN
  Administered 2020-07-19: 100 mL via INTRAVENOUS

## 2020-07-19 NOTE — Discharge Instructions (Addendum)
Please seek medical attention for any high fevers, chest pain, shortness of breath, change in behavior, persistent vomiting, bloody stool or any other new or concerning symptoms.  

## 2020-07-19 NOTE — ED Triage Notes (Signed)
Pt arrived via POV with reports of chest pain since Tuesday, pt states his pain worsened today, c/o shortness of breath, pain is non-radiating.   Pt sees cardiologist, can't remember his name.  Pt states the pain worsens when he moves

## 2020-07-19 NOTE — ED Notes (Signed)
Pt to CT

## 2020-07-19 NOTE — ED Provider Notes (Signed)
[facility] Emergency Department Provider Note   ____________________________________________   I have reviewed the triage vital signs and the nursing notes.   HISTORY  Chief Complaint Chest Pain   History limited by: Not Limited   HPI [name] NOVELLO Sr. is a 64 y.o. male who presents to the emergency department today because of concerns for chest pain.  Patient states that the chest pain started 3 days ago.  It is located in his left chest.  He states it has been fairly constant.  The patient has had associated shortness of breath.  The pain is worse with movement.  The patient states that he has had associated shortness of breath. Has history of COPD and has been using his inhaler and breathing treatments. The patient states that he has history of cardiac disease and surgery. The patient denies any fevers. Has noticed some swelling to his left leg.    Records reviewed. Per medical record review patient has a history of CAD, COPD, mediastinitis   Past Medical History:  Diagnosis Date  . Anxiety disorder   . Arthritis   . CAD (coronary artery disease)    a. ~ 2000 cath at [facility];  b. 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  . CHF (congestive heart failure) (HCC)   . Chronic back pain   . Chronic hip pain   . COPD (chronic obstructive pulmonary disease) (HCC)   . Depression   . Essential hypertension, benign   . GERD (gastroesophageal reflux disease)   . Headache(784.0)   . History of alcohol abuse   . History of cocaine abuse (HCC)   . Hyperlipidemia   . Obesity   . Pneumonia     Patient Active Problem List   Diagnosis Date Noted  . Respiratory failure (HCC) 10/22/2018  . COPD with acute exacerbation (HCC) 09/08/2018  . Hyperthyroidism 07/26/2018  . NSTEMI (non-ST elevated myocardial infarction) (HCC)  07/11/2015  . Chest pain 04/30/2015  . Syncope 04/30/2015  . Orthostatic syncope 04/30/2015  . Chronic back pain   . (HFpEF) heart failure with preserved ejection fraction (HCC)   . History of cocaine abuse (HCC)   . History of alcohol abuse   . Prediabetes 08/12/2014  . Tobacco abuse 09/21/2013  . Sleep apnea- C-pap intol 09/14/2013  . Coronary atherosclerosis of native coronary artery   . OBESITY, MORBID 05/29/2008  . Dyslipidemia 01/19/2007  . ANXIETY DISORDER, GENERALIZED 01/19/2007  . Depression 10/20/2006  . Essential hypertension, benign 10/20/2006  . COPD (chronic obstructive pulmonary disease) (HCC) 10/20/2006    Past Surgical History:  Procedure Laterality Date  . BACK SURGERY    . CHOLECYSTECTOMY    . CORONARY ARTERY BYPASS GRAFT    . HIP SURGERY     rt  . JOINT REPLACEMENT    . LEFT HEART CATHETERIZATION WITH CORONARY ANGIOGRAM N/A 09/12/2013   Procedure: LEFT HEART CATHETERIZATION WITH CORONARY ANGIOGRAM;  Surgeon: Kathleene Hazel, MD;  Location: [facility] CATH LAB;  Service: Cardiovascular;  Laterality: N/A;  . NASAL/SINUS ENDOSCOPY    . TOTAL HIP ARTHROPLASTY  05/08/2012   Procedure: TOTAL HIP ARTHROPLASTY;  Surgeon: Nestor Lewandowsky, MD;  Location: [facility] OR;  Service: Orthopedics;  Laterality: Left;  . TOTAL HIP ARTHROPLASTY  2014 per pt    Prior to Admission medications   Medication Sig  Start Date End Date Taking? Authorizing Provider  aspirin 81 MG tablet Take 1 tablet (81 mg total) by mouth daily. 05/31/12   Kathlen Mody, MD  budesonide-formoterol (SYMBICORT) 160-4.5 MCG/ACT inhaler Inhale 2 puffs into the lungs 2 (two) times daily.    [provider]  buPROPion (WELLBUTRIN XL) 150 MG 24 hr tablet 2 Tablet(s) By Mouth Daily 07/15/18   [provider]  citalopram (CELEXA) 10 MG tablet Take 10 mg by mouth daily. 02/29/20   [provider]  [employer] MELATONIN 3 MG TABS Take 1 tablet by mouth at bedtime.  03/22/18   [provider]   fluticasone (FLONASE) 50 MCG/ACT nasal spray Place 2 sprays into both nostrils 2 (two) times daily. 12/04/19   [provider]  furosemide (LASIX) 20 MG tablet Take 40 mg by mouth every morning.     [provider]  hydrOXYzine (ATARAX/VISTARIL) 25 MG tablet Take 50 mg by mouth at bedtime. 02/10/20   [provider]  isosorbide mononitrate (IMDUR) 60 MG 24 hr tablet Take 60 mg by mouth daily. 06/22/18   [provider]  mirtazapine (REMERON) 15 MG tablet Take 15 mg by mouth at bedtime. 12/21/19   [provider]  nitroGLYCERIN (NITROSTAT) 0.4 MG SL tablet Place 0.4 mg under the tongue every 5 (five) minutes as needed. For chest pain    [provider]  Olopatadine HCl (PATADAY) 0.2 % SOLN Apply 2 drops to eye 2 (two) times daily.    [provider]  omeprazole (PRILOSEC) 20 MG capsule Take 20 mg by mouth every morning.     [provider]  oxyCODONE-acetaminophen (PERCOCET) 10-325 MG tablet Take 1 tablet by mouth every 4 (four) hours as needed for pain.    [provider]  PROAIR HFA 108 (90 BASE) MCG/ACT inhaler Inhale 2 puffs into the lungs daily as needed for wheezing or shortness of breath.  06/20/13   [provider]  REPATHA SURECLICK 140 MG/ML SOAJ SMARTSIG:140 Milligram(s) SUB-Q Every 2 Weeks 02/29/20   [provider]  traZODone (DESYREL) 50 MG tablet Take 1-2 tablets by mouth at bedtime.  03/22/18   [provider]    Allergies Fluticasone furoate-vilanterol, Tiotropium, Ativan [lorazepam], Haloperidol decanoate, Atorvastatin, and Sulfamethoxazole-trimethoprim  Family History  Problem Relation Age of Onset  . Heart attack Mother   . Heart attack Brother   . Heart attack Sister   . Heart attack Father     Social History Social History   Tobacco Use  . Smoking status: Former Smoker    Packs/day: 0.50    Years: 39.00    Pack years: 19.50    Types: Cigarettes    Quit date:  09/15/2015    Years since quitting: 4.8  . Smokeless tobacco: Never Used  Vaping Use  . Vaping Use: Never used  Substance Use Topics  . Alcohol use: Yes    Alcohol/week: 0.0 standard drinks  . Drug use: No    Review of Systems Constitutional: No fever/chills Eyes: No visual changes. ENT: No sore throat. Cardiovascular: Positive for chest pain. Respiratory: Positive for shortness of breath. Gastrointestinal: No abdominal pain.  No nausea, no vomiting.  No diarrhea.   Genitourinary: Negative for dysuria. Musculoskeletal: Positive for left leg swelling.  Skin: Negative for rash. Neurological: Negative for headaches, focal weakness or numbness.  ____________________________________________   PHYSICAL EXAM:  VITAL SIGNS: ED Triage Vitals  Enc Vitals Group     BP 07/19/20 1448 (!) 145/97  Pulse Rate 07/19/20 1448 81     Resp 07/19/20 1448 (!) 22     Temp 07/19/20 1448 98.1 F (36.7 C)     Temp Source 07/19/20 1448 Oral     SpO2 07/19/20 1448 96 %     Weight 07/19/20 1448 (!) 354 lb (160.6 kg)     Height 07/19/20 1448 6\' 3"  (1.905 m)     Head Circumference --      Peak Flow --      Pain Score 07/19/20 1446 7   Constitutional: Alert and oriented.  Eyes: Conjunctivae are normal.  ENT      Head: Normocephalic and atraumatic.      Nose: No congestion/rhinnorhea.      Mouth/Throat: Mucous membranes are moist.      Neck: No stridor. Hematological/Lymphatic/Immunilogical: No cervical lymphadenopathy. Cardiovascular: Normal rate, regular rhythm.  No murmurs, rubs, or gallops.  Respiratory: Normal respiratory effort without tachypnea nor retractions. Diffuse wheezing. Gastrointestinal: Soft and non tender. No rebound. No guarding.  Genitourinary: Deferred Musculoskeletal: Normal range of motion in all extremities. No lower extremity edema. Neurologic:  Normal speech and language. No gross focal neurologic deficits are appreciated.  Skin:  Skin is warm, dry and intact. No  rash noted. Psychiatric: Mood and affect are normal. Speech and behavior are normal. Patient exhibits appropriate insight and judgment.  ____________________________________________    LABS (pertinent positives/negatives)  Trop hs 5 x 2 CBC wbc 9.5, hgb 14.3, plt 231 BMP wnl except CO2 21, Glu 155  ____________________________________________   EKG  I, 07/21/20, attending physician, personally viewed and interpreted this EKG  EKG Time: 1436 Rate: 78 Rhythm: normal sinus rhythm Axis: left axis deviation Intervals: qtc 444 QRS: incomplete RBBB ST changes: no st elevation Impression: abnormal ekg  ____________________________________________    RADIOLOGY  CXR No active cardiopulmonary disease. Chronic lung findings in bases, slightly worse.  ____________________________________________   PROCEDURES  Procedures  ____________________________________________   INITIAL IMPRESSION / ASSESSMENT AND PLAN / ED COURSE  Pertinent labs & imaging results that were available during my care of the patient were reviewed by me and considered in my medical decision making (see chart for details).   Patient presented to the emergency department today because of concerns for chest pain.  It has been ongoing for the past few days.  Patient does have history of significant cardiac disease.  Troponin was negative x2 today.  Given the patient was complaining of some leg swelling as well as the shortness of breath I did obtain a CT angio to evaluate for PE.  This was negative.  Additionally this did not show any concerning signs for pneumonia.  Patient did feel better after IV medication.  At this time given negative troponin and CT scan I think it is reasonable for patient to be discharged home.  I did discuss with patient strict return precautions as well as close follow-up with cardiologist.  ___________________________________________   FINAL CLINICAL IMPRESSION(S) / ED  DIAGNOSES  Final diagnoses:  Nonspecific chest pain     Note: This dictation was prepared with Dragon dictation. Any transcriptional errors that result from this process are unintentional     Phineas Semen, MD 07/19/20 2009

## 2020-08-27 DIAGNOSIS — L570 Actinic keratosis: Secondary | ICD-10-CM | POA: Diagnosis not present

## 2020-08-27 DIAGNOSIS — M778 Other enthesopathies, not elsewhere classified: Secondary | ICD-10-CM | POA: Diagnosis not present

## 2020-09-08 ENCOUNTER — Emergency Department: Payer: Medicare Other

## 2020-09-08 ENCOUNTER — Other Ambulatory Visit: Payer: Self-pay

## 2020-09-08 DIAGNOSIS — R059 Cough, unspecified: Secondary | ICD-10-CM | POA: Diagnosis not present

## 2020-09-08 DIAGNOSIS — Z951 Presence of aortocoronary bypass graft: Secondary | ICD-10-CM | POA: Insufficient documentation

## 2020-09-08 DIAGNOSIS — J441 Chronic obstructive pulmonary disease with (acute) exacerbation: Secondary | ICD-10-CM | POA: Diagnosis not present

## 2020-09-08 DIAGNOSIS — Z79899 Other long term (current) drug therapy: Secondary | ICD-10-CM | POA: Diagnosis not present

## 2020-09-08 DIAGNOSIS — I509 Heart failure, unspecified: Secondary | ICD-10-CM | POA: Insufficient documentation

## 2020-09-08 DIAGNOSIS — R079 Chest pain, unspecified: Secondary | ICD-10-CM | POA: Diagnosis not present

## 2020-09-08 DIAGNOSIS — R0602 Shortness of breath: Secondary | ICD-10-CM | POA: Diagnosis not present

## 2020-09-08 DIAGNOSIS — Z20822 Contact with and (suspected) exposure to covid-19: Secondary | ICD-10-CM | POA: Insufficient documentation

## 2020-09-08 DIAGNOSIS — J069 Acute upper respiratory infection, unspecified: Secondary | ICD-10-CM | POA: Insufficient documentation

## 2020-09-08 DIAGNOSIS — R0600 Dyspnea, unspecified: Secondary | ICD-10-CM | POA: Diagnosis not present

## 2020-09-08 DIAGNOSIS — Z87891 Personal history of nicotine dependence: Secondary | ICD-10-CM | POA: Diagnosis not present

## 2020-09-08 DIAGNOSIS — I2510 Atherosclerotic heart disease of native coronary artery without angina pectoris: Secondary | ICD-10-CM | POA: Insufficient documentation

## 2020-09-08 DIAGNOSIS — Z7951 Long term (current) use of inhaled steroids: Secondary | ICD-10-CM | POA: Diagnosis not present

## 2020-09-08 DIAGNOSIS — Z96642 Presence of left artificial hip joint: Secondary | ICD-10-CM | POA: Insufficient documentation

## 2020-09-08 DIAGNOSIS — I110 Hypertensive heart disease with heart failure: Secondary | ICD-10-CM | POA: Insufficient documentation

## 2020-09-08 DIAGNOSIS — J811 Chronic pulmonary edema: Secondary | ICD-10-CM | POA: Diagnosis not present

## 2020-09-08 DIAGNOSIS — Z7982 Long term (current) use of aspirin: Secondary | ICD-10-CM | POA: Insufficient documentation

## 2020-09-08 LAB — BASIC METABOLIC PANEL
Anion gap: 12 (ref 5–15)
BUN: 20 mg/dL (ref 8–23)
CO2: 23 mmol/L (ref 22–32)
Calcium: 9.2 mg/dL (ref 8.9–10.3)
Chloride: 103 mmol/L (ref 98–111)
Creatinine, Ser: 1.25 mg/dL — ABNORMAL HIGH (ref 0.61–1.24)
GFR, Estimated: >60 mL/min (ref >60)
Glucose, Bld: 164 mg/dL — ABNORMAL HIGH (ref 70–99)
Potassium: 3.7 mmol/L (ref 3.5–5.1)
Sodium: 138 mmol/L (ref 135–145)

## 2020-09-08 LAB — CBC
HCT: 39.3 % (ref 39.0–52.0)
Hemoglobin: 13.4 g/dL (ref 13.0–17.0)
MCH: 31.6 pg (ref 26.0–34.0)
MCHC: 34.1 g/dL (ref 30.0–36.0)
MCV: 92.7 fL (ref 80.0–100.0)
Platelets: 242 10*3/uL (ref 150–400)
RBC: 4.24 MIL/uL (ref 4.22–5.81)
RDW: 12.6 % (ref 11.5–15.5)
WBC: 11.8 10*3/uL — ABNORMAL HIGH (ref 4.0–10.5)
nRBC: 0 % (ref 0.0–0.2)

## 2020-09-08 LAB — TROPONIN I (HIGH SENSITIVITY): Troponin I (High Sensitivity): 5 ng/L (ref <18)

## 2020-09-08 NOTE — ED Triage Notes (Signed)
Pt endorses cough, chills, shortness of breath, and chest pain since Thursday. Son (who lives with pt) had covid last week per patient. Hx open heart surgery per pt. VSS in triage at this time.

## 2020-09-09 ENCOUNTER — Emergency Department
Admission: EM | Admit: 2020-09-09 | Discharge: 2020-09-09 | Disposition: A | Discharge status: Home / Self Care | Payer: Medicare Other | Attending: Student in an Organized Health Care Education/Training Program | Admitting: Student in an Organized Health Care Education/Training Program

## 2020-09-09 DIAGNOSIS — R059 Cough, unspecified: Secondary | ICD-10-CM

## 2020-09-09 DIAGNOSIS — I509 Heart failure, unspecified: Secondary | ICD-10-CM

## 2020-09-09 DIAGNOSIS — J069 Acute upper respiratory infection, unspecified: Secondary | ICD-10-CM

## 2020-09-09 LAB — RESP PANEL BY RT-PCR (FLU A&B, COVID) ARPGX2
Influenza A by PCR: NEGATIVE
Influenza B by PCR: NEGATIVE
SARS Coronavirus 2 by RT PCR: NEGATIVE

## 2020-09-09 LAB — TROPONIN I (HIGH SENSITIVITY): Troponin I (High Sensitivity): 5 ng/L (ref <18)

## 2020-09-09 MED ORDER — GUAIFENESIN-CODEINE 100-10 MG/5ML PO SOLN: 5.0000 mL | Freq: Four times a day (QID) | ORAL | 0 refills | Status: DC | PRN

## 2020-09-09 MED ORDER — HYDROCOD POLST-CPM POLST ER 10-8 MG/5ML PO SUER
5.0000 mL | Freq: Once | ORAL | Status: AC
  Administered 2020-09-09: 5 mL via ORAL
  Filled 2020-09-09: qty 5

## 2020-09-09 MED ORDER — PREDNISONE 10 MG PO TABS: ORAL_TABLET | ORAL | 0 refills | Status: DC

## 2020-09-09 MED ORDER — ACETAMINOPHEN 500 MG PO TABS
1000.0000 mg | ORAL_TABLET | Freq: Once | ORAL | Status: AC
  Administered 2020-09-09: 1000 mg via ORAL
  Filled 2020-09-09: qty 2

## 2020-09-09 NOTE — ED Provider Notes (Signed)
[facility] Emergency Department Provider Note  Time seen: 8:30 AM  I have reviewed the triage vital signs and the nursing notes.   HISTORY  Chief Complaint Shortness of Breath, Cough, and Chest Pain   HPI [name] TREFZ Sr. is a 65 y.o. male with a past medical history of anxiety, CAD, CHF, COPD, gastric reflux, hyperlipidemia, hypertension, history of CABG, presents to the emergency department for cough shortness of breath chest pain.  According to the patient his son was sick with COVID-19 last week, beginning Friday he developed cough and shortness of breath.  Patient states he is fully vaccinated and boosted.   Patient states he has been having frequent cough that is now causing pain in his chest when he coughs.  No vomiting but nausea.  Subjective fever/chills at home.  Past Medical History:  Diagnosis Date  . Anxiety disorder   . Arthritis   . CAD (coronary artery disease)    a. ~ 2000 cath at [facility];  b. 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  . CHF (congestive heart failure) (HCC)   . Chronic back pain   . Chronic hip pain   . COPD (chronic obstructive pulmonary disease) (HCC)   . Depression   . Essential hypertension, benign   . GERD (gastroesophageal reflux disease)   . Headache(784.0)   . History of alcohol abuse   . History of cocaine abuse (HCC)   . Hyperlipidemia   . Obesity   . Pneumonia     Patient Active Problem List   Diagnosis Date Noted  . Respiratory failure (HCC) 10/22/2018  . COPD with acute exacerbation (HCC) 09/08/2018  . Hyperthyroidism 07/26/2018  . NSTEMI (non-ST elevated myocardial infarction) (HCC) 07/11/2015  . Chest pain 04/30/2015  . Syncope 04/30/2015  . Orthostatic syncope 04/30/2015  . Chronic back pain   . (HFpEF) heart failure with preserved ejection fraction (HCC)   .  History of cocaine abuse (HCC)   . History of alcohol abuse   . Prediabetes 08/12/2014  . Tobacco abuse 09/21/2013  . Sleep apnea- C-pap intol 09/14/2013  . Coronary atherosclerosis of native coronary artery   . OBESITY, MORBID 05/29/2008  . Dyslipidemia 01/19/2007  . ANXIETY DISORDER, GENERALIZED 01/19/2007  . Depression 10/20/2006  . Essential hypertension, benign 10/20/2006  . COPD (chronic obstructive pulmonary disease) (HCC) 10/20/2006    Past Surgical History:  Procedure Laterality Date  . BACK SURGERY    . CHOLECYSTECTOMY    . CORONARY ARTERY BYPASS GRAFT    . HIP SURGERY     rt  . JOINT REPLACEMENT    . LEFT HEART CATHETERIZATION WITH CORONARY ANGIOGRAM N/A 09/12/2013   Procedure: LEFT HEART CATHETERIZATION WITH CORONARY ANGIOGRAM;  Surgeon: Kathleene Hazel, MD;  Location: [facility] CATH LAB;  Service: Cardiovascular;  Laterality: N/A;  . NASAL/SINUS ENDOSCOPY    . TOTAL HIP ARTHROPLASTY  05/08/2012   Procedure: TOTAL HIP ARTHROPLASTY;  Surgeon: Nestor Lewandowsky, MD;  Location: [facility] OR;  Service: Orthopedics;  Laterality: Left;  . TOTAL HIP ARTHROPLASTY  2014 per pt    Prior to Admission medications   Medication Sig Start Date End Date Taking? Authorizing Provider  aspirin 81 MG tablet Take 1 tablet (81 mg total) by mouth daily. 05/31/12   Kathlen Mody, MD  budesonide-formoterol (SYMBICORT) 160-4.5 MCG/ACT inhaler Inhale  2 puffs into the lungs 2 (two) times daily.    [provider]  buPROPion (WELLBUTRIN XL) 150 MG 24 hr tablet 2 Tablet(s) By Mouth Daily 07/15/18   [provider]  citalopram (CELEXA) 10 MG tablet Take 10 mg by mouth daily. 02/29/20   [provider]  [employer] MELATONIN 3 MG TABS Take 1 tablet by mouth at bedtime.  03/22/18   [provider]  fluticasone (FLONASE) 50 MCG/ACT nasal spray Place 2 sprays into both nostrils 2 (two) times daily. 12/04/19   [provider]  furosemide (LASIX) 20 MG tablet Take 40 mg by mouth every  morning.     [provider]  hydrOXYzine (ATARAX/VISTARIL) 25 MG tablet Take 50 mg by mouth at bedtime. 02/10/20   [provider]  isosorbide mononitrate (IMDUR) 60 MG 24 hr tablet Take 60 mg by mouth daily. 06/22/18   [provider]  mirtazapine (REMERON) 15 MG tablet Take 15 mg by mouth at bedtime. 12/21/19   [provider]  nitroGLYCERIN (NITROSTAT) 0.4 MG SL tablet Place 0.4 mg under the tongue every 5 (five) minutes as needed. For chest pain    [provider]  Olopatadine HCl (PATADAY) 0.2 % SOLN Apply 2 drops to eye 2 (two) times daily.    [provider]  omeprazole (PRILOSEC) 20 MG capsule Take 20 mg by mouth every morning.     [provider]  oxyCODONE-acetaminophen (PERCOCET) 10-325 MG tablet Take 1 tablet by mouth every 4 (four) hours as needed for pain.    [provider]  PROAIR HFA 108 (90 BASE) MCG/ACT inhaler Inhale 2 puffs into the lungs daily as needed for wheezing or shortness of breath.  06/20/13   [provider]  REPATHA SURECLICK 140 MG/ML SOAJ SMARTSIG:140 Milligram(s) SUB-Q Every 2 Weeks 02/29/20   [provider]  traZODone (DESYREL) 50 MG tablet Take 1-2 tablets by mouth at bedtime.  03/22/18   [provider]    Allergies  Allergen Reactions  . Fluticasone Furoate-Vilanterol Swelling    Tongue Swelling  . Tiotropium Swelling    Tongue Swelling   . Ativan [Lorazepam] Other (See Comments)    Altered Mental Status   . Haloperidol Decanoate Other (See Comments)    Altered Mental Status   . Atorvastatin Other (See Comments)    Pain to joints  . Sulfamethoxazole-Trimethoprim Itching    Family History  Problem Relation Age of Onset  . Heart attack Mother   . Heart attack Brother   . Heart attack Sister   . Heart attack Father     Social History Social History   Tobacco Use  . Smoking status: Former Smoker    Packs/day: 0.50    Years: 39.00    Pack  years: 19.50    Types: Cigarettes    Quit date: 09/15/2015    Years since quitting: 4.9  . Smokeless tobacco: Never Used  Vaping Use  . Vaping Use: Never used  Substance Use Topics  . Alcohol use: Yes    Alcohol/week: 0.0 standard drinks  . Drug use: No    Review of Systems Constitutional: Subjective fever/chills Cardiovascular: Moderate central chest pain with cough Respiratory: Positive for shortness of breath and cough Gastrointestinal: Negative for abdominal pain, vomiting and diarrhea. Musculoskeletal: Negative for musculoskeletal complaints Skin: Negative for skin complaints  Neurological: Negative for headache All other ROS negative  ____________________________________________   PHYSICAL EXAM:  VITAL SIGNS: ED Triage Vitals  Enc Vitals Group  BP 09/08/20 2123 (!) 139/108     Pulse Rate 09/08/20 2123 87     Resp 09/08/20 2123 (!) 22     Temp 09/08/20 2123 98.6 F (37 C)     Temp Source 09/08/20 2123 Oral     SpO2 09/08/20 2123 97 %     Weight 09/08/20 2125 (!) 361 lb (163.7 kg)     Height 09/08/20 2125 6\' 3"  (1.905 m)     Head Circumference --      Peak Flow --      Pain Score 09/08/20 2124 7     Pain Loc --      Pain Edu? --      Excl. in GC? --    Constitutional: Alert and oriented. Well appearing and in no distress. Eyes: Normal exam ENT      Head: Normocephalic and atraumatic.      Mouth/Throat: Mucous membranes are moist. Cardiovascular: Normal rate, regular rhythm.  Respiratory: Very slight tachypnea around 20 to 22 breaths/min.  Breath sounds are clear.  No rales rhonchi or wheezes. Gastrointestinal: Soft and nontender. No distention.   Musculoskeletal: Nontender with normal range of motion in all extremities. Neurologic:  Normal speech and language. No gross focal neurologic deficits Skin:  Skin is warm, dry and intact.  Psychiatric: Mood and affect are normal.   ____________________________________________    EKG  EKG viewed and  interpreted by myself shows a normal sinus rhythm at 94 bpm with a narrow QRS, normal axis, normal intervals, nonspecific ST changes  ____________________________________________    RADIOLOGY  Chest x-ray shows hazy opacities throughout both lungs.  ____________________________________________   INITIAL IMPRESSION / ASSESSMENT AND PLAN / ED COURSE  Pertinent labs & imaging results that were available during my care of the patient were reviewed by me and considered in my medical decision making (see chart for details).   Patient presents emergency department for shortness of breath cough subjective fever/chills.  Patient's son had COVID last week patient came down with symptoms 4 days ago.  Reassuringly patient is satting 98% during my evaluation.  Overall appears well but does have frequent cough during exam.  Clear lung sounds.  Patient's lab work is overall reassuring.  Troponin negative x2.  Lab work otherwise largely nonrevealing.  Highly suspect COVID-19 infection.  We will obtain a rapid COVID swab.  We will continue to closely monitor.  PCR test is negative.  Patient clarifies that her son had congestion and was not sure if her son had COVID last week.  Given the patient's history of CHF and x-ray findings I did discuss with the patient increasing his Lasix from 40 mg daily to 60 mg daily for the next 5 days.  However given the clinical suspicion for COVID we will also discharge with a short taper of steroids and a cough medication.  I discussed return precautions for any worsening shortness of breath.  Patient agreeable to plan of care.  [name] CRISANTO Sr. was evaluated in Emergency Department on 09/09/2020 for the symptoms described in the history of present illness. He was evaluated in the context of the global COVID-19 pandemic, which necessitated consideration that the patient might be at risk for infection with the SARS-CoV-2 virus that causes COVID-19. Institutional protocols and  algorithms that pertain to the evaluation of patients at risk for COVID-19 are in a state of rapid change based on information released by regulatory bodies including the CDC and federal and state organizations. These policies and  algorithms were followed during the patient's care in the ED.  ____________________________________________   FINAL CLINICAL IMPRESSION(S) / ED DIAGNOSES  Upper respiratory infection CHF   Minna Antis, MD 09/09/20 1338

## 2020-09-09 NOTE — ED Notes (Signed)
Pt requests tylenol for generalized HA

## 2020-09-17 DIAGNOSIS — G894 Chronic pain syndrome: Secondary | ICD-10-CM | POA: Diagnosis not present

## 2020-09-17 DIAGNOSIS — M791 Myalgia, unspecified site: Secondary | ICD-10-CM | POA: Diagnosis not present

## 2020-09-17 DIAGNOSIS — M47816 Spondylosis without myelopathy or radiculopathy, lumbar region: Secondary | ICD-10-CM | POA: Diagnosis not present

## 2020-09-17 DIAGNOSIS — Z79899 Other long term (current) drug therapy: Secondary | ICD-10-CM | POA: Diagnosis not present

## 2020-09-17 DIAGNOSIS — M5136 Other intervertebral disc degeneration, lumbar region: Secondary | ICD-10-CM | POA: Diagnosis not present

## 2020-09-25 DIAGNOSIS — I2510 Atherosclerotic heart disease of native coronary artery without angina pectoris: Secondary | ICD-10-CM | POA: Diagnosis not present

## 2020-09-25 DIAGNOSIS — E7800 Pure hypercholesterolemia, unspecified: Secondary | ICD-10-CM | POA: Diagnosis not present

## 2020-09-25 DIAGNOSIS — I10 Essential (primary) hypertension: Secondary | ICD-10-CM | POA: Diagnosis not present

## 2020-09-25 DIAGNOSIS — R21 Rash and other nonspecific skin eruption: Secondary | ICD-10-CM | POA: Diagnosis not present

## 2020-10-19 ENCOUNTER — Emergency Department: Payer: Medicare Other

## 2020-10-19 ENCOUNTER — Inpatient Hospital Stay
Admission: EM | Admit: 2020-10-19 | Discharge: 2020-10-22 | DRG: 190 | Disposition: A | Discharge status: Home / Self Care | Payer: Medicare Other | Attending: Internal Medicine | Admitting: Internal Medicine

## 2020-10-19 ENCOUNTER — Other Ambulatory Visit: Payer: Self-pay

## 2020-10-19 ENCOUNTER — Encounter: Payer: Self-pay | Admitting: Emergency Medicine

## 2020-10-19 DIAGNOSIS — E119 Type 2 diabetes mellitus without complications: Secondary | ICD-10-CM | POA: Diagnosis present

## 2020-10-19 DIAGNOSIS — I5032 Chronic diastolic (congestive) heart failure: Secondary | ICD-10-CM | POA: Diagnosis not present

## 2020-10-19 DIAGNOSIS — E785 Hyperlipidemia, unspecified: Secondary | ICD-10-CM | POA: Diagnosis not present

## 2020-10-19 DIAGNOSIS — Z8249 Family history of ischemic heart disease and other diseases of the circulatory system: Secondary | ICD-10-CM

## 2020-10-19 DIAGNOSIS — I25118 Atherosclerotic heart disease of native coronary artery with other forms of angina pectoris: Secondary | ICD-10-CM | POA: Diagnosis present

## 2020-10-19 DIAGNOSIS — Z743 Need for continuous supervision: Secondary | ICD-10-CM | POA: Diagnosis not present

## 2020-10-19 DIAGNOSIS — I517 Cardiomegaly: Secondary | ICD-10-CM | POA: Diagnosis not present

## 2020-10-19 DIAGNOSIS — Z96642 Presence of left artificial hip joint: Secondary | ICD-10-CM | POA: Diagnosis present

## 2020-10-19 DIAGNOSIS — G8929 Other chronic pain: Secondary | ICD-10-CM | POA: Diagnosis not present

## 2020-10-19 DIAGNOSIS — R059 Cough, unspecified: Secondary | ICD-10-CM | POA: Diagnosis not present

## 2020-10-19 DIAGNOSIS — Z951 Presence of aortocoronary bypass graft: Secondary | ICD-10-CM | POA: Diagnosis not present

## 2020-10-19 DIAGNOSIS — I110 Hypertensive heart disease with heart failure: Secondary | ICD-10-CM | POA: Diagnosis not present

## 2020-10-19 DIAGNOSIS — F1010 Alcohol abuse, uncomplicated: Secondary | ICD-10-CM | POA: Diagnosis present

## 2020-10-19 DIAGNOSIS — J09X2 Influenza due to identified novel influenza A virus with other respiratory manifestations: Secondary | ICD-10-CM | POA: Diagnosis not present

## 2020-10-19 DIAGNOSIS — R6889 Other general symptoms and signs: Secondary | ICD-10-CM | POA: Diagnosis not present

## 2020-10-19 DIAGNOSIS — F411 Generalized anxiety disorder: Secondary | ICD-10-CM | POA: Diagnosis present

## 2020-10-19 DIAGNOSIS — I252 Old myocardial infarction: Secondary | ICD-10-CM | POA: Diagnosis not present

## 2020-10-19 DIAGNOSIS — J441 Chronic obstructive pulmonary disease with (acute) exacerbation: Secondary | ICD-10-CM | POA: Diagnosis not present

## 2020-10-19 DIAGNOSIS — Z6841 Body Mass Index (BMI) 40.0 and over, adult: Secondary | ICD-10-CM | POA: Diagnosis not present

## 2020-10-19 DIAGNOSIS — J101 Influenza due to other identified influenza virus with other respiratory manifestations: Secondary | ICD-10-CM

## 2020-10-19 DIAGNOSIS — J449 Chronic obstructive pulmonary disease, unspecified: Secondary | ICD-10-CM | POA: Diagnosis not present

## 2020-10-19 DIAGNOSIS — Z7984 Long term (current) use of oral hypoglycemic drugs: Secondary | ICD-10-CM | POA: Diagnosis not present

## 2020-10-19 DIAGNOSIS — Z20822 Contact with and (suspected) exposure to covid-19: Secondary | ICD-10-CM | POA: Diagnosis not present

## 2020-10-19 DIAGNOSIS — F1410 Cocaine abuse, uncomplicated: Secondary | ICD-10-CM | POA: Diagnosis present

## 2020-10-19 DIAGNOSIS — F32A Depression, unspecified: Secondary | ICD-10-CM | POA: Diagnosis present

## 2020-10-19 DIAGNOSIS — Z888 Allergy status to other drugs, medicaments and biological substances status: Secondary | ICD-10-CM

## 2020-10-19 DIAGNOSIS — Z87891 Personal history of nicotine dependence: Secondary | ICD-10-CM | POA: Diagnosis not present

## 2020-10-19 DIAGNOSIS — K219 Gastro-esophageal reflux disease without esophagitis: Secondary | ICD-10-CM | POA: Diagnosis not present

## 2020-10-19 DIAGNOSIS — M549 Dorsalgia, unspecified: Secondary | ICD-10-CM | POA: Diagnosis present

## 2020-10-19 DIAGNOSIS — I10 Essential (primary) hypertension: Secondary | ICD-10-CM | POA: Diagnosis present

## 2020-10-19 DIAGNOSIS — Z7982 Long term (current) use of aspirin: Secondary | ICD-10-CM | POA: Diagnosis not present

## 2020-10-19 DIAGNOSIS — Z79899 Other long term (current) drug therapy: Secondary | ICD-10-CM

## 2020-10-19 DIAGNOSIS — J9601 Acute respiratory failure with hypoxia: Secondary | ICD-10-CM | POA: Diagnosis present

## 2020-10-19 DIAGNOSIS — I509 Heart failure, unspecified: Secondary | ICD-10-CM | POA: Diagnosis not present

## 2020-10-19 DIAGNOSIS — R0602 Shortness of breath: Secondary | ICD-10-CM | POA: Diagnosis not present

## 2020-10-19 DIAGNOSIS — E0590 Thyrotoxicosis, unspecified without thyrotoxic crisis or storm: Secondary | ICD-10-CM | POA: Diagnosis present

## 2020-10-19 DIAGNOSIS — Z8614 Personal history of Methicillin resistant Staphylococcus aureus infection: Secondary | ICD-10-CM

## 2020-10-19 DIAGNOSIS — R7303 Prediabetes: Secondary | ICD-10-CM | POA: Diagnosis not present

## 2020-10-19 DIAGNOSIS — Y900 Blood alcohol level of less than 20 mg/100 ml: Secondary | ICD-10-CM | POA: Diagnosis not present

## 2020-10-19 DIAGNOSIS — G4730 Sleep apnea, unspecified: Secondary | ICD-10-CM | POA: Diagnosis present

## 2020-10-19 DIAGNOSIS — Z7952 Long term (current) use of systemic steroids: Secondary | ICD-10-CM

## 2020-10-19 DIAGNOSIS — M1990 Unspecified osteoarthritis, unspecified site: Secondary | ICD-10-CM | POA: Diagnosis present

## 2020-10-19 DIAGNOSIS — R404 Transient alteration of awareness: Secondary | ICD-10-CM | POA: Diagnosis not present

## 2020-10-19 DIAGNOSIS — R079 Chest pain, unspecified: Secondary | ICD-10-CM

## 2020-10-19 DIAGNOSIS — I2510 Atherosclerotic heart disease of native coronary artery without angina pectoris: Secondary | ICD-10-CM | POA: Diagnosis present

## 2020-10-19 DIAGNOSIS — G4733 Obstructive sleep apnea (adult) (pediatric): Secondary | ICD-10-CM | POA: Diagnosis not present

## 2020-10-19 DIAGNOSIS — I499 Cardiac arrhythmia, unspecified: Secondary | ICD-10-CM | POA: Diagnosis not present

## 2020-10-19 DIAGNOSIS — R4020 Unspecified coma: Secondary | ICD-10-CM | POA: Diagnosis not present

## 2020-10-19 DIAGNOSIS — I5030 Unspecified diastolic (congestive) heart failure: Secondary | ICD-10-CM | POA: Diagnosis present

## 2020-10-19 LAB — CBC WITH DIFFERENTIAL/PLATELET
Abs Immature Granulocytes: 0.03 10*3/uL (ref 0.00–0.07)
Basophils Absolute: 0.1 10*3/uL (ref 0.0–0.1)
Basophils Relative: 1 %
Eosinophils Absolute: 0.2 10*3/uL (ref 0.0–0.5)
Eosinophils Relative: 3 %
HCT: 37.9 % — ABNORMAL LOW (ref 39.0–52.0)
Hemoglobin: 12.6 g/dL — ABNORMAL LOW (ref 13.0–17.0)
Immature Granulocytes: 0 %
Lymphocytes Relative: 21 %
Lymphs Abs: 1.7 10*3/uL (ref 0.7–4.0)
MCH: 31.7 pg (ref 26.0–34.0)
MCHC: 33.2 g/dL (ref 30.0–36.0)
MCV: 95.5 fL (ref 80.0–100.0)
Monocytes Absolute: 1.5 10*3/uL — ABNORMAL HIGH (ref 0.1–1.0)
Monocytes Relative: 19 %
Neutro Abs: 4.4 10*3/uL (ref 1.7–7.7)
Neutrophils Relative %: 56 %
Platelets: 205 10*3/uL (ref 150–400)
RBC: 3.97 MIL/uL — ABNORMAL LOW (ref 4.22–5.81)
RDW: 13.2 % (ref 11.5–15.5)
WBC: 7.9 10*3/uL (ref 4.0–10.5)
nRBC: 0 % (ref 0.0–0.2)

## 2020-10-19 LAB — BASIC METABOLIC PANEL
Anion gap: 8 (ref 5–15)
BUN: 20 mg/dL (ref 8–23)
CO2: 22 mmol/L (ref 22–32)
Calcium: 8.9 mg/dL (ref 8.9–10.3)
Chloride: 106 mmol/L (ref 98–111)
Creatinine, Ser: 1.2 mg/dL (ref 0.61–1.24)
GFR, Estimated: >60 mL/min (ref >60)
Glucose, Bld: 86 mg/dL (ref 70–99)
Potassium: 4.1 mmol/L (ref 3.5–5.1)
Sodium: 136 mmol/L (ref 135–145)

## 2020-10-19 LAB — COMPREHENSIVE METABOLIC PANEL
ALT: 20 U/L (ref 0–44)
AST: 28 U/L (ref 15–41)
Albumin: 4.2 g/dL (ref 3.5–5.0)
Alkaline Phosphatase: 54 U/L (ref 38–126)
Anion gap: 7 (ref 5–15)
BUN: 21 mg/dL (ref 8–23)
CO2: 24 mmol/L (ref 22–32)
Calcium: 8.7 mg/dL — ABNORMAL LOW (ref 8.9–10.3)
Chloride: 104 mmol/L (ref 98–111)
Creatinine, Ser: 1.23 mg/dL (ref 0.61–1.24)
GFR, Estimated: >60 mL/min (ref >60)
Glucose, Bld: 89 mg/dL (ref 70–99)
Potassium: 4.1 mmol/L (ref 3.5–5.1)
Sodium: 135 mmol/L (ref 135–145)
Total Bilirubin: 0.9 mg/dL (ref 0.3–1.2)
Total Protein: 7.9 g/dL (ref 6.5–8.1)

## 2020-10-19 LAB — CBC
HCT: 39.3 % (ref 39.0–52.0)
Hemoglobin: 13 g/dL (ref 13.0–17.0)
MCH: 31.6 pg (ref 26.0–34.0)
MCHC: 33.1 g/dL (ref 30.0–36.0)
MCV: 95.4 fL (ref 80.0–100.0)
Platelets: 210 10*3/uL (ref 150–400)
RBC: 4.12 MIL/uL — ABNORMAL LOW (ref 4.22–5.81)
RDW: 13.3 % (ref 11.5–15.5)
WBC: 7.1 10*3/uL (ref 4.0–10.5)
nRBC: 0 % (ref 0.0–0.2)

## 2020-10-19 LAB — URINALYSIS, COMPLETE (UACMP) WITH MICROSCOPIC
Bilirubin Urine: NEGATIVE
Glucose, UA: NEGATIVE mg/dL
Hgb urine dipstick: NEGATIVE
Ketones, ur: NEGATIVE mg/dL
Leukocytes,Ua: NEGATIVE
Nitrite: NEGATIVE
Protein, ur: NEGATIVE mg/dL
Specific Gravity, Urine: 1.011 (ref 1.005–1.030)
pH: 5 (ref 5.0–8.0)

## 2020-10-19 LAB — BRAIN NATRIURETIC PEPTIDE: B Natriuretic Peptide: 70.7 pg/mL (ref 0.0–100.0)

## 2020-10-19 LAB — GLUCOSE, CAPILLARY: Glucose-Capillary: 168 mg/dL — ABNORMAL HIGH (ref 70–99)

## 2020-10-19 LAB — RESP PANEL BY RT-PCR (FLU A&B, COVID) ARPGX2
Influenza A by PCR: POSITIVE — AB
Influenza B by PCR: NEGATIVE
SARS Coronavirus 2 by RT PCR: NEGATIVE

## 2020-10-19 LAB — APTT: aPTT: 31 seconds (ref 24–36)

## 2020-10-19 LAB — LACTIC ACID, PLASMA
Lactic Acid, Venous: 1.4 mmol/L (ref 0.5–1.9)
Lactic Acid, Venous: 1.2 mmol/L (ref 0.5–1.9)

## 2020-10-19 LAB — TROPONIN I (HIGH SENSITIVITY)
Troponin I (High Sensitivity): 7 ng/L (ref <18)
Troponin I (High Sensitivity): 7 ng/L (ref <18)

## 2020-10-19 LAB — PROTIME-INR
INR: 1.1 (ref 0.8–1.2)
Prothrombin Time: 13.9 seconds (ref 11.4–15.2)

## 2020-10-19 MED ORDER — ENOXAPARIN SODIUM 80 MG/0.8ML ~~LOC~~ SOLN
80.0000 mg | SUBCUTANEOUS | Status: DC
  Administered 2020-10-19 – 2020-10-21 (×3): 80 mg via SUBCUTANEOUS
  Filled 2020-10-19 (×5): qty 0.8

## 2020-10-19 MED ORDER — SODIUM CHLORIDE 0.9% FLUSH
3.0000 mL | Freq: Two times a day (BID) | INTRAVENOUS | Status: DC
  Administered 2020-10-19 – 2020-10-22 (×6): 3 mL via INTRAVENOUS

## 2020-10-19 MED ORDER — GUAIFENESIN ER 600 MG PO TB12
600.0000 mg | ORAL_TABLET | Freq: Two times a day (BID) | ORAL | Status: DC | PRN
  Administered 2020-10-19 – 2020-10-21 (×3): 600 mg via ORAL
  Filled 2020-10-19 (×3): qty 1

## 2020-10-19 MED ORDER — PANTOPRAZOLE SODIUM 40 MG PO TBEC: 40.0000 mg | DELAYED_RELEASE_TABLET | Freq: Every day | ORAL | Status: DC

## 2020-10-19 MED ORDER — IPRATROPIUM-ALBUTEROL 0.5-2.5 (3) MG/3ML IN SOLN
3.0000 mL | Freq: Four times a day (QID) | RESPIRATORY_TRACT | Status: DC
  Administered 2020-10-19 – 2020-10-21 (×5): 3 mL via RESPIRATORY_TRACT
  Filled 2020-10-19 (×8): qty 3

## 2020-10-19 MED ORDER — FUROSEMIDE 40 MG PO TABS
40.0000 mg | ORAL_TABLET | Freq: Every morning | ORAL | Status: DC
  Administered 2020-10-20 – 2020-10-22 (×3): 40 mg via ORAL
  Filled 2020-10-19 (×3): qty 1

## 2020-10-19 MED ORDER — HYDROCODONE-HOMATROPINE 5-1.5 MG/5ML PO SYRP
5.0000 mL | ORAL_SOLUTION | ORAL | Status: AC
Start: 2020-10-19 — End: 2020-10-19
  Administered 2020-10-19: 5 mL via ORAL
  Filled 2020-10-19: qty 5

## 2020-10-19 MED ORDER — ACETAMINOPHEN 650 MG RE SUPP: 650.0000 mg | Freq: Four times a day (QID) | RECTAL | Status: DC | PRN

## 2020-10-19 MED ORDER — MELATONIN 5 MG PO TABS
5.0000 mg | ORAL_TABLET | Freq: Every day | ORAL | Status: DC
Start: 2020-10-19 — End: 2020-10-22
  Administered 2020-10-19 – 2020-10-21 (×3): 5 mg via ORAL
  Filled 2020-10-19 (×3): qty 1

## 2020-10-19 MED ORDER — OXYCODONE-ACETAMINOPHEN 5-325 MG PO TABS
2.0000 | ORAL_TABLET | ORAL | Status: AC
  Administered 2020-10-19: 2 via ORAL
  Filled 2020-10-19: qty 2

## 2020-10-19 MED ORDER — OXYCODONE-ACETAMINOPHEN 10-325 MG PO TABS
1.0000 | ORAL_TABLET | ORAL | Status: DC | PRN
Start: 2020-10-19 — End: 2020-10-19

## 2020-10-19 MED ORDER — ALBUTEROL SULFATE (2.5 MG/3ML) 0.083% IN NEBU
5.0000 mg | INHALATION_SOLUTION | RESPIRATORY_TRACT | Status: AC
  Filled 2020-10-19: qty 6

## 2020-10-19 MED ORDER — MIRTAZAPINE 15 MG PO TABS
15.0000 mg | ORAL_TABLET | Freq: Every day | ORAL | Status: DC
  Administered 2020-10-19 – 2020-10-21 (×3): 15 mg via ORAL
  Filled 2020-10-19 (×3): qty 1

## 2020-10-19 MED ORDER — BUPROPION HCL ER (XL) 150 MG PO TB24
300.0000 mg | ORAL_TABLET | Freq: Every day | ORAL | Status: DC
  Administered 2020-10-20 – 2020-10-22 (×3): 300 mg via ORAL
  Filled 2020-10-19 (×3): qty 2

## 2020-10-19 MED ORDER — OXYCODONE HCL 5 MG PO TABS
10.0000 mg | ORAL_TABLET | ORAL | Status: DC | PRN
  Administered 2020-10-19 – 2020-10-22 (×13): 10 mg via ORAL
  Filled 2020-10-19 (×13): qty 2

## 2020-10-19 MED ORDER — ISOSORBIDE MONONITRATE ER 30 MG PO TB24
60.0000 mg | ORAL_TABLET | Freq: Every day | ORAL | Status: DC
  Administered 2020-10-20 – 2020-10-22 (×3): 60 mg via ORAL
  Filled 2020-10-19 (×3): qty 2

## 2020-10-19 MED ORDER — METHYLPREDNISOLONE SODIUM SUCC 125 MG IJ SOLR
125.0000 mg | INTRAMUSCULAR | Status: AC
  Administered 2020-10-19: 125 mg via INTRAVENOUS
  Filled 2020-10-19: qty 2

## 2020-10-19 MED ORDER — AZITHROMYCIN 250 MG PO TABS
250.0000 mg | ORAL_TABLET | Freq: Every day | ORAL | Status: DC
  Administered 2020-10-19 – 2020-10-22 (×4): 250 mg via ORAL
  Filled 2020-10-19 (×3): qty 1

## 2020-10-19 MED ORDER — ACETAMINOPHEN 325 MG PO TABS
650.0000 mg | ORAL_TABLET | Freq: Four times a day (QID) | ORAL | Status: DC | PRN
  Administered 2020-10-20: 650 mg via ORAL
  Filled 2020-10-19: qty 2

## 2020-10-19 MED ORDER — HYDROXYZINE HCL 50 MG PO TABS
50.0000 mg | ORAL_TABLET | Freq: Every day | ORAL | Status: DC
  Administered 2020-10-19 – 2020-10-21 (×3): 50 mg via ORAL
  Filled 2020-10-19 (×4): qty 1

## 2020-10-19 MED ORDER — ALBUTEROL SULFATE HFA 108 (90 BASE) MCG/ACT IN AERS
2.0000 | INHALATION_SPRAY | Freq: Every day | RESPIRATORY_TRACT | Status: DC | PRN
  Administered 2020-10-20: 2 via RESPIRATORY_TRACT
  Filled 2020-10-19 (×2): qty 6.7

## 2020-10-19 MED ORDER — INSULIN ASPART 100 UNIT/ML ~~LOC~~ SOLN
0.0000 [IU] | Freq: Three times a day (TID) | SUBCUTANEOUS | Status: DC
  Administered 2020-10-20: 2 [IU] via SUBCUTANEOUS
  Administered 2020-10-20: 3 [IU] via SUBCUTANEOUS
  Administered 2020-10-20: 5 [IU] via SUBCUTANEOUS
  Administered 2020-10-21: 3 [IU] via SUBCUTANEOUS
  Administered 2020-10-21: 5 [IU] via SUBCUTANEOUS
  Administered 2020-10-22: 3 [IU] via SUBCUTANEOUS
  Filled 2020-10-19 (×6): qty 1

## 2020-10-19 MED ORDER — ASPIRIN EC 81 MG PO TBEC
81.0000 mg | DELAYED_RELEASE_TABLET | Freq: Every day | ORAL | Status: DC
  Administered 2020-10-20 – 2020-10-22 (×3): 81 mg via ORAL
  Filled 2020-10-19 (×3): qty 1

## 2020-10-19 MED ORDER — ALBUTEROL SULFATE (2.5 MG/3ML) 0.083% IN NEBU
INHALATION_SOLUTION | RESPIRATORY_TRACT | Status: AC
  Administered 2020-10-19: 5 mg via RESPIRATORY_TRACT
  Filled 2020-10-19: qty 6

## 2020-10-19 MED ORDER — CITALOPRAM HYDROBROMIDE 10 MG PO TABS
10.0000 mg | ORAL_TABLET | Freq: Every day | ORAL | Status: DC
  Administered 2020-10-20 – 2020-10-22 (×3): 10 mg via ORAL
  Filled 2020-10-19 (×3): qty 1

## 2020-10-19 MED ORDER — POLYETHYLENE GLYCOL 3350 17 G PO PACK: 17.0000 g | PACK | Freq: Every day | ORAL | Status: DC | PRN

## 2020-10-19 MED ORDER — ALBUTEROL SULFATE (2.5 MG/3ML) 0.083% IN NEBU
2.5000 mg | INHALATION_SOLUTION | RESPIRATORY_TRACT | Status: AC
  Administered 2020-10-19: 2.5 mg via RESPIRATORY_TRACT
  Filled 2020-10-19: qty 3

## 2020-10-19 MED ORDER — MOMETASONE FURO-FORMOTEROL FUM 200-5 MCG/ACT IN AERO
2.0000 | INHALATION_SPRAY | Freq: Two times a day (BID) | RESPIRATORY_TRACT | Status: DC
  Filled 2020-10-19: qty 8.8

## 2020-10-19 MED ORDER — ACETAMINOPHEN 325 MG PO TABS
325.0000 mg | ORAL_TABLET | ORAL | Status: DC | PRN
  Administered 2020-10-19 – 2020-10-20 (×2): 325 mg via ORAL
  Filled 2020-10-19 (×2): qty 1

## 2020-10-19 MED ORDER — PREDNISONE 20 MG PO TABS: 40.0000 mg | ORAL_TABLET | Freq: Every day | ORAL | Status: DC

## 2020-10-19 NOTE — Plan of Care (Signed)
New care plan initiated 

## 2020-10-19 NOTE — ED Triage Notes (Signed)
Pt to ED via POV with c/o sudden onset SOB for approx 1 HR PTA, pt with RA sats 88% on arrival to ED. Pt with noted strong, non productive cough on arrival to ED, pt with noted mild difficulty speaking in complete sentences.

## 2020-10-19 NOTE — H&P (Signed)
History and Physical   [name] NWG:[id number] DOB: 1956-02-15 DOA: 10/19/2020  PCP: Bubba Camp, MD   Patient coming from: Home  Chief Complaint: Shortness of breath  HPI: [name] MORES Sr. is a 65 y.o. male with medical history significant of CHF, anxiety, depression, back pain, COPD, hyperlipidemia, CAD status post NSTEMI, hypertension, alcohol use, cocaine use, hyperthyroidism, obesity, orthostatic syncope, OSA intolerant of CPAP, GERD presents with ongoing cough and new onset shortness of breath.  Patient has had 5 days of cough and reports shortness of breath which began about 1 hour prior to presentation to the ED.  He states his cough has been productive and he has some rib pain due to the frequency of his cough. He also reports some sweats and chills. He denies fevers, chest pain, abdominal pain, constipation, diarrhea, nausea, vomiting.  ED Course: Vital signs in the ED significant for respirations in the 20s and requiring 2 L nasal cannula to maintain saturations in the low 90s.  CMP was within normal limits, CBC showed hemoglobin of 12.6.  PT, PTT, INR within normal limits.  Troponin normal with repeat pending, lactic acid normal with repeat pending.  Respiratory panel was positive for flu.  BNP, urinalysis, urine culture, blood cultures are all pending.  Chest x-ray showed atelectasis versus edema.  Patient received Solu-Medrol, nebulizations in the ED.  Review of Systems: As per HPI otherwise all other systems reviewed and are negative.  Past Medical History:  Diagnosis Date  . Anxiety disorder   . Arthritis   . CAD (coronary artery disease)    a. ~ 2000 cath at [facility];  b. 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  . CHF (congestive heart failure) (HCC)   . Chronic back pain   . Chronic hip pain   . COPD (chronic  obstructive pulmonary disease) (HCC)   . Depression   . Essential hypertension, benign   . GERD (gastroesophageal reflux disease)   . Headache(784.0)   . History of alcohol abuse   . History of cocaine abuse (HCC)   . Hyperlipidemia   . Obesity   . Pneumonia     Past Surgical History:  Procedure Laterality Date  . BACK SURGERY    . CHOLECYSTECTOMY    . CORONARY ARTERY BYPASS GRAFT    . HIP SURGERY     rt  . JOINT REPLACEMENT    . LEFT HEART CATHETERIZATION WITH CORONARY ANGIOGRAM N/A 09/12/2013   Procedure: LEFT HEART CATHETERIZATION WITH CORONARY ANGIOGRAM;  Surgeon: Kathleene Hazel, MD;  Location: [facility] CATH LAB;  Service: Cardiovascular;  Laterality: N/A;  . NASAL/SINUS ENDOSCOPY    . TOTAL HIP ARTHROPLASTY  05/08/2012   Procedure: TOTAL HIP ARTHROPLASTY;  Surgeon: Nestor Lewandowsky, MD;  Location: [facility] OR;  Service: Orthopedics;  Laterality: Left;  . TOTAL HIP ARTHROPLASTY  2014 per pt    Social History  reports that he quit smoking about 5 years ago. His smoking use included cigarettes. He has a 19.50 pack-year smoking history. He has never used smokeless tobacco. He reports current alcohol use. He reports that he does not use drugs.  Allergies  Allergen Reactions  . Fluticasone Furoate-Vilanterol Swelling    Tongue Swelling  . Tiotropium Swelling    Tongue Swelling   . Ativan [Lorazepam] Other (See Comments)  Altered Mental Status   . Haloperidol Decanoate Other (See Comments)    Altered Mental Status   . Atorvastatin Other (See Comments)    Pain to joints  . Sulfamethoxazole-Trimethoprim Itching    Family History  Problem Relation Age of Onset  . Heart attack Mother   . Heart attack Brother   . Heart attack Sister   . Heart attack Father   reviewed on admission  Prior to Admission medications   Medication Sig Start Date End Date Taking? Authorizing Provider  aspirin 81 MG tablet Take 1 tablet (81 mg total) by mouth daily. 05/31/12   Kathlen Mody, MD   budesonide-formoterol (SYMBICORT) 160-4.5 MCG/ACT inhaler Inhale 2 puffs into the lungs 2 (two) times daily.    [provider]  buPROPion (WELLBUTRIN XL) 150 MG 24 hr tablet 2 Tablet(s) By Mouth Daily 07/15/18   [provider]  citalopram (CELEXA) 10 MG tablet Take 10 mg by mouth daily. 02/29/20   [provider]  [facility] MELATONIN 3 MG TABS Take 1 tablet by mouth at bedtime.  03/22/18   [provider]  fluticasone (FLONASE) 50 MCG/ACT nasal spray Place 2 sprays into both nostrils 2 (two) times daily. 12/04/19   [provider]  furosemide (LASIX) 20 MG tablet Take 40 mg by mouth every morning.     [provider]  guaiFENesin-codeine 100-10 MG/5ML syrup Take 5 mLs by mouth every 6 (six) hours as needed for cough. 09/09/20   Minna Antis, MD  hydrOXYzine (ATARAX/VISTARIL) 25 MG tablet Take 50 mg by mouth at bedtime. 02/10/20   [provider]  isosorbide mononitrate (IMDUR) 60 MG 24 hr tablet Take 60 mg by mouth daily. 06/22/18   [provider]  mirtazapine (REMERON) 15 MG tablet Take 15 mg by mouth at bedtime. 12/21/19   [provider]  nitroGLYCERIN (NITROSTAT) 0.4 MG SL tablet Place 0.4 mg under the tongue every 5 (five) minutes as needed. For chest pain    [provider]  Olopatadine HCl (PATADAY) 0.2 % SOLN Apply 2 drops to eye 2 (two) times daily.    [provider]  omeprazole (PRILOSEC) 20 MG capsule Take 20 mg by mouth every morning.     [provider]  oxyCODONE-acetaminophen (PERCOCET) 10-325 MG tablet Take 1 tablet by mouth every 4 (four) hours as needed for pain.    [provider]  predniSONE (DELTASONE) 10 MG tablet Take 5 tablets p.o. daily on day 1, decrease by 1 tablet daily. 09/09/20   Minna Antis, MD  PROAIR HFA 108 (90 BASE) MCG/ACT inhaler Inhale 2 puffs into the lungs daily as needed for wheezing or shortness of breath.  06/20/13   [provider]  REPATHA SURECLICK 140 MG/ML SOAJ SMARTSIG:140 Milligram(s) SUB-Q Every 2 Weeks 02/29/20   [provider]  traZODone (DESYREL) 50 MG tablet Take 1-2 tablets by mouth at bedtime.  03/22/18   [provider]    Physical Exam: Vitals:   10/19/20 1715 10/19/20 1716 10/19/20 1800 10/19/20 1915  BP: 106/77  117/68 (!) 131/53  Pulse: 81  74 73  Resp: (!) 28   (!) 26  Temp: 97.9 F (36.6 C)  97.9 F (36.6 C)   TempSrc: Axillary  Oral Oral  SpO2: 97%  96% 96%  Weight:  (!) 158.8 kg    Height:  6\' 3"  (1.905 m)     Physical Exam Constitutional:      General: He is not in acute distress.  Appearance: Normal appearance. He is obese.  HENT:     Head: Normocephalic and atraumatic.     Mouth/Throat:     Mouth: Mucous membranes are moist.     Pharynx: Oropharynx is clear.  Eyes:     Extraocular Movements: Extraocular movements intact.     Pupils: Pupils are equal, round, and reactive to light.  Cardiovascular:     Rate and Rhythm: Normal rate and regular rhythm.     Pulses: Normal pulses.     Heart sounds: Normal heart sounds.  Pulmonary:     Effort: Pulmonary effort is normal. No respiratory distress.     Breath sounds: Wheezing present.     Comments: Tachypnea Abdominal:     General: Bowel sounds are normal. There is no distension.     Palpations: Abdomen is soft.     Tenderness: There is no abdominal tenderness.  Musculoskeletal:        General: No swelling or deformity.  Skin:    General: Skin is warm and dry.     Comments: Chest scar from prior upper thoracic surgery  Neurological:     General: No focal deficit present.     Mental Status: Mental status is at baseline.    Labs on Admission: I have personally reviewed following labs and imaging studies  CBC: Recent Labs  Lab 10/19/20 1720 10/19/20 1738  WBC 7.1 7.9  NEUTROABS  --  4.4  HGB 13.0 12.6*  HCT 39.3 37.9*  MCV 95.4 95.5  PLT 210 205    Basic Metabolic Panel: Recent Labs  Lab  10/19/20 1720 10/19/20 1738  NA 136 135  K 4.1 4.1  CL 106 104  CO2 22 24  GLUCOSE 86 89  BUN 20 21  CREATININE 1.20 1.23  CALCIUM 8.9 8.7*    GFR: Estimated Creatinine Clearance: 100.6 mL/min (by C-G formula based on SCr of 1.23 mg/dL).  Liver Function Tests: Recent Labs  Lab 10/19/20 1738  AST 28  ALT 20  ALKPHOS 54  BILITOT 0.9  PROT 7.9  ALBUMIN 4.2    Urine analysis:    Component Value Date/Time   COLORURINE YELLOW (A) 10/19/2020 1734   APPEARANCEUR CLEAR (A) 10/19/2020 1734   LABSPEC 1.011 10/19/2020 1734   PHURINE 5.0 10/19/2020 1734   GLUCOSEU NEGATIVE 10/19/2020 1734   HGBUR NEGATIVE 10/19/2020 1734   BILIRUBINUR NEGATIVE 10/19/2020 1734   KETONESUR NEGATIVE 10/19/2020 1734   PROTEINUR NEGATIVE 10/19/2020 1734   UROBILINOGEN 0.2 05/03/2012 1339   NITRITE NEGATIVE 10/19/2020 1734   LEUKOCYTESUR NEGATIVE 10/19/2020 1734    Radiological Exams on Admission: DG Chest Port 1 View  Result Date: 10/19/2020 CLINICAL DATA:  Shortness of breath, cough. EXAM: PORTABLE CHEST 1 VIEW COMPARISON:  September 08, 2020. FINDINGS: Mild cardiomegaly is noted. No pneumothorax or pleural effusion is noted. Mild bibasilar subsegmental atelectasis or edema is noted. Bony thorax is unremarkable. IMPRESSION: Mild bibasilar subsegmental atelectasis or edema. Electronically Signed   By: Lupita Raider M.D.   On: 10/19/2020 18:10    EKG: Independently reviewed.  When I got here it has been a lot all day sinus rhythm at 77 bpm.  Some low voltage noted in the precordial leads.  Assessment/Plan Principal Problem:   COPD exacerbation (HCC) Active Problems:   Dyslipidemia   Depression   ANXIETY DISORDER, GENERALIZED   Essential hypertension, benign   Coronary atherosclerosis of native coronary artery   Sleep apnea- C-pap intol   Prediabetes   Chronic back pain   (  HFpEF) heart failure with preserved ejection fraction (HCC)   Influenza A  Flu A COPD exacerbation OSA  intolerant of CPAP > patient with wheezing, shortness of breath, productive cough.  Cough started 5 days ago, shortness of breath started today. > Tested positive for influenza A in the ED.  Currently requiring 2 L to maintain saturations in the low 90s in ED. > Chest x-ray with atelectasis versus edema > does have history of heart failure as below, checking BNP to see if this is contributing - Monitor on MedSurg with telemetry - Continue daily steroids - DuoNebs every 6 hours while awake - Home Symbicort replaced with formulary Dulera - as needed albuterol - Daily azithromycin  CHF Hypertension > BNP Normal - Continue home Lasix, Imdur  Anxiety Depression - Continue home Wellbutrin, Celexa, Remeron, trazodone, hydroxyzine, melatonin  Back pain - Continue home oxycodone  Hyperlipidemia CAD status post NSTEMI in CABG at [facility] which was complicated by MRSA wound infection. - Continue home aspirin  Diabetes/prediabetes - SSI  GERD - Continue PPI  DVT prophylaxis: Lovenox  Code Status:   Full  Family Communication:  None on admission Disposition Plan:   Patient is from:  Home  Anticipated DC to:  Home  Anticipated DC date:  1 to 2 days  Anticipated DC barriers: None  Consults called:  None  Admission status:  Observation, MedSurg with telemetry   Severity of Illness: The appropriate patient status for this patient is OBSERVATION. Observation status is judged to be reasonable and necessary in order to provide the required intensity of service to ensure the patient's safety. The patient's presenting symptoms, physical exam findings, and initial radiographic and laboratory data in the context of their medical condition is felt to place them at decreased risk for further clinical deterioration. Furthermore, it is anticipated that the patient will be medically stable for discharge from the hospital within 2 midnights of admission. The following factors support the patient status  of observation.   " The patient's presenting symptoms include shortness of breath, cough. " The physical exam findings include wheezing, tachypnea. " The initial radiographic and laboratory data are chest x-ray with atelectasis versus edema, respiratory panel positive for flu A..  Synetta Fail MD [facility]  How to contact the [facility] Attending or Consulting provider 7A - 7P or covering provider during after hours 7P -7A, for this patient?   1. Check the care team in [facility] and look for a) attending/consulting [facility] provider listed and b) the [facility] team listed 2. Log into [email or web address] and use 's universal password to access. If you do not have the password, please contact the hospital operator. 3. Locate the [facility] provider you are looking for under [facility] and page to a number that you can be directly reached. 4. If you still have difficulty reaching the provider, please page the [facility] (Director on Call) for the Hospitalists listed on amion for assistance.  10/19/2020, 8:21 PM

## 2020-10-19 NOTE — ED Notes (Addendum)
#   [id number] Patient valuables Envelope sealed with valuables listed on envelope as well as here. Security, Wiley, Clinical research associate and patient all witnessed and counted belongings that went inside envelope. Patient signed as well as Environmental manager. Security Wiley with envelope to take to [address] to have key locked in pyxis. Pt educated to ask for possessions upon discharge.   $100 bills- 2 $50 bills- 2 $20 bills- 5 $10 bills- 2 $5 bills- 8 $1 bills- 17  Total $497.00  Black wallet and key chain with 3 keys

## 2020-10-19 NOTE — ED Provider Notes (Signed)
[facility] Emergency Department Provider Note   ____________________________________________   Event Date/Time   First MD Initiated Contact with Patient 10/19/20 1730     (approximate)  I have reviewed the triage vital signs and the nursing notes.   HISTORY  Chief Complaint Shortness of Breath    HPI [name] Hoose. is a 65 y.o. male extensive history of cardiac surgeries,'s congestive heart failure COPD alcohol and cocaine abuse  Patient comes in today reports that 5 days of severe dry cough.  He cannot seem to improve he feels short of breath with it.  Of note he was also noted to be hypoxic 88% on room air  He denies severe chest pain except he reports he is coughing so hard his ribs are hurting.  No nausea or vomiting.  Does report feeling hot and cold as if having fevers or chills off and on at home.  He has been wheezing has COPD  Denies swelling but reports he is on 3 different medications for congestive heart failure.  Primarily reports feeling short of breath and severe cough   Past Medical History:  Diagnosis Date  . Anxiety disorder   . Arthritis   . CAD (coronary artery disease)    a. ~ 2000 cath at [facility];  b. 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  . CHF (congestive heart failure) (HCC)   . Chronic back pain   . Chronic hip pain   . COPD (chronic obstructive pulmonary disease) (HCC)   . Depression   . Essential hypertension, benign   . GERD (gastroesophageal reflux disease)   . Headache(784.0)   . History of alcohol abuse   . History of cocaine abuse (HCC)   . Hyperlipidemia   . Obesity   . Pneumonia     Patient Active Problem List   Diagnosis Date Noted  . Respiratory failure (HCC) 10/22/2018  . COPD with acute exacerbation (HCC) 09/08/2018  . Hyperthyroidism 07/26/2018  .  NSTEMI (non-ST elevated myocardial infarction) (HCC) 07/11/2015  . Chest pain 04/30/2015  . Syncope 04/30/2015  . Orthostatic syncope 04/30/2015  . Chronic back pain   . (HFpEF) heart failure with preserved ejection fraction (HCC)   . History of cocaine abuse (HCC)   . History of alcohol abuse   . Prediabetes 08/12/2014  . Tobacco abuse 09/21/2013  . Sleep apnea- C-pap intol 09/14/2013  . Coronary atherosclerosis of native coronary artery   . OBESITY, MORBID 05/29/2008  . Dyslipidemia 01/19/2007  . ANXIETY DISORDER, GENERALIZED 01/19/2007  . Depression 10/20/2006  . Essential hypertension, benign 10/20/2006  . COPD (chronic obstructive pulmonary disease) (HCC) 10/20/2006    Past Surgical History:  Procedure Laterality Date  . BACK SURGERY    . CHOLECYSTECTOMY    . CORONARY ARTERY BYPASS GRAFT    . HIP SURGERY     rt  . JOINT REPLACEMENT    . LEFT HEART CATHETERIZATION WITH CORONARY ANGIOGRAM N/A 09/12/2013   Procedure: LEFT HEART CATHETERIZATION WITH CORONARY ANGIOGRAM;  Surgeon: Kathleene Hazel, MD;  Location: [facility] CATH LAB;  Service: Cardiovascular;  Laterality: N/A;  . NASAL/SINUS ENDOSCOPY    . TOTAL HIP ARTHROPLASTY  05/08/2012   Procedure: TOTAL HIP ARTHROPLASTY;  Surgeon: Nestor Lewandowsky, MD;  Location: [facility] OR;  Service: Orthopedics;  Laterality: Left;  . TOTAL  HIP ARTHROPLASTY  2014 per pt    Prior to Admission medications   Medication Sig Start Date End Date Taking? Authorizing Provider  aspirin 81 MG tablet Take 1 tablet (81 mg total) by mouth daily. 05/31/12   Kathlen Mody, MD  budesonide-formoterol (SYMBICORT) 160-4.5 MCG/ACT inhaler Inhale 2 puffs into the lungs 2 (two) times daily.    [provider]  buPROPion (WELLBUTRIN XL) 150 MG 24 hr tablet 2 Tablet(s) By Mouth Daily 07/15/18   [provider]  citalopram (CELEXA) 10 MG tablet Take 10 mg by mouth daily. 02/29/20   [provider]  [employer] MELATONIN 3 MG TABS Take 1 tablet by mouth at  bedtime.  03/22/18   [provider]  fluticasone (FLONASE) 50 MCG/ACT nasal spray Place 2 sprays into both nostrils 2 (two) times daily. 12/04/19   [provider]  furosemide (LASIX) 20 MG tablet Take 40 mg by mouth every morning.     [provider]  guaiFENesin-codeine 100-10 MG/5ML syrup Take 5 mLs by mouth every 6 (six) hours as needed for cough. 09/09/20   Minna Antis, MD  hydrOXYzine (ATARAX/VISTARIL) 25 MG tablet Take 50 mg by mouth at bedtime. 02/10/20   [provider]  isosorbide mononitrate (IMDUR) 60 MG 24 hr tablet Take 60 mg by mouth daily. 06/22/18   [provider]  mirtazapine (REMERON) 15 MG tablet Take 15 mg by mouth at bedtime. 12/21/19   [provider]  nitroGLYCERIN (NITROSTAT) 0.4 MG SL tablet Place 0.4 mg under the tongue every 5 (five) minutes as needed. For chest pain    [provider]  Olopatadine HCl (PATADAY) 0.2 % SOLN Apply 2 drops to eye 2 (two) times daily.    [provider]  omeprazole (PRILOSEC) 20 MG capsule Take 20 mg by mouth every morning.     [provider]  oxyCODONE-acetaminophen (PERCOCET) 10-325 MG tablet Take 1 tablet by mouth every 4 (four) hours as needed for pain.    [provider]  predniSONE (DELTASONE) 10 MG tablet Take 5 tablets p.o. daily on day 1, decrease by 1 tablet daily. 09/09/20   Minna Antis, MD  PROAIR HFA 108 (90 BASE) MCG/ACT inhaler Inhale 2 puffs into the lungs daily as needed for wheezing or shortness of breath.  06/20/13   [provider]  REPATHA SURECLICK 140 MG/ML SOAJ SMARTSIG:140 Milligram(s) SUB-Q Every 2 Weeks 02/29/20   [provider]  traZODone (DESYREL) 50 MG tablet Take 1-2 tablets by mouth at bedtime.  03/22/18   [provider]    Allergies Fluticasone furoate-vilanterol, Tiotropium, Ativan [lorazepam], Haloperidol decanoate, Atorvastatin, and Sulfamethoxazole-trimethoprim  Family History   Problem Relation Age of Onset  . Heart attack Mother   . Heart attack Brother   . Heart attack Sister   . Heart attack Father     Social History Social History   Tobacco Use  . Smoking status: Former Smoker    Packs/day: 0.50    Years: 39.00    Pack years: 19.50    Types: Cigarettes    Quit date: 09/15/2015    Years since quitting: 5.0  . Smokeless tobacco: Never Used  Vaping Use  . Vaping Use: Never used  Substance Use Topics  . Alcohol use: Yes    Alcohol/week: 0.0 standard drinks  . Drug use: No    Review of Systems Constitutional: Feels hot at home Eyes: No visual changes. ENT: No sore throat. Cardiovascular: See HPI pain when coughing Respiratory:  See HPI gastrointestinal: No abdominal pain.   Genitourinary: Negative for dysuria. Musculoskeletal: Negative for back pain. Skin: Negative for rash.  Denies any swelling in his leg Neurological: Negative for headaches, areas of focal weakness or numbness.    ____________________________________________   PHYSICAL EXAM:  VITAL SIGNS: ED Triage Vitals  Enc Vitals Group     BP 10/19/20 1715 106/77     Pulse Rate 10/19/20 1715 81     Resp 10/19/20 1715 (!) 28     Temp 10/19/20 1715 97.9 F (36.6 C)     Temp Source 10/19/20 1715 Axillary     SpO2 10/19/20 1715 97 %     Weight 10/19/20 1716 (!) 350 lb (158.8 kg)     Height 10/19/20 1716 6\' 3"  (1.905 m)     Head Circumference --      Peak Flow --      Pain Score --      Pain Loc --      Pain Edu? --      Excl. in GC? --     Constitutional: Alert and oriented.  Mildly ill-appearing, frequently coughing tachypneic having a hard time getting words between coughing episodes. Eyes: Conjunctivae are normal. Head: Atraumatic. Nose: No congestion/rhinnorhea. Mouth/Throat: Mucous membranes are moist. Neck: No stridor.  Cardiovascular: Normal rate, regular rhythm. Grossly normal heart sounds.  Good peripheral circulation.  Large postsurgical inward bowing of the  sternum. Respiratory: Tachypnea speaks in phrases.  Frequent coughing.  When he is not coughing he does seem relatively calm with fairly normal work of breathing but he is coughing very frequently with a somewhat staccato cough.  Lung sounds are coarse bilaterally with some wheezing. Gastrointestinal: Soft and nontender. No distention. Musculoskeletal: No lower extremity tenderness moderate bilateral lower extremity. Neurologic:  Normal speech and language. No gross focal neurologic deficits are appreciated.  Skin:  Skin is warm, dry and intact. No rash noted. Psychiatric: Mood and affect are anxious. Speech and behavior are normal.  ____________________________________________   LABS (all labs ordered are listed, but only abnormal results are displayed)  Labs Reviewed  RESP PANEL BY RT-PCR (FLU A&B, COVID) ARPGX2 - Abnormal; Notable for the following components:      Result Value   Influenza A by PCR POSITIVE (*)    All other components within normal limits  CBC - Abnormal; Notable for the following components:   RBC 4.12 (*)    All other components within normal limits  COMPREHENSIVE METABOLIC PANEL - Abnormal; Notable for the following components:   Calcium 8.7 (*)    All other components within normal limits  CBC WITH DIFFERENTIAL/PLATELET - Abnormal; Notable for the following components:   RBC 3.97 (*)    Hemoglobin 12.6 (*)    HCT 37.9 (*)    Monocytes Absolute 1.5 (*)    All other components within normal limits  CULTURE, BLOOD (SINGLE)  URINE CULTURE  BASIC METABOLIC PANEL  LACTIC ACID, PLASMA  PROTIME-INR  APTT  LACTIC ACID, PLASMA  URINALYSIS, COMPLETE (UACMP) WITH MICROSCOPIC  BRAIN NATRIURETIC PEPTIDE  TROPONIN I (HIGH SENSITIVITY)  TROPONIN I (HIGH SENSITIVITY)   ____________________________________________  EKG  Reviewed entered by me at 1720 Heart rate 80 QRS 110 QTc 450 Normal sinus rhythm no evidence of acute  ischemia ____________________________________________  RADIOLOGY  DG Chest Port 1 View  Result Date: 10/19/2020 CLINICAL DATA:  Shortness of breath, cough. EXAM: PORTABLE CHEST 1 VIEW COMPARISON:  September 08, 2020. FINDINGS: Mild cardiomegaly is noted. No pneumothorax  or pleural effusion is noted. Mild bibasilar subsegmental atelectasis or edema is noted. Bony thorax is unremarkable. IMPRESSION: Mild bibasilar subsegmental atelectasis or edema. Electronically Signed   By: Lupita Raider M.D.   On: 10/19/2020 18:10    Imaging reviewed, mild atelectasis or edema ____________________________________________   PROCEDURES  Procedure(s) performed: None  Procedures  Critical Care performed: No  ____________________________________________   INITIAL IMPRESSION / ASSESSMENT AND PLAN / ED COURSE  Pertinent labs & imaging results that were available during my care of the patient were reviewed by me and considered in my medical decision making (see chart for details).   Patient presents for chest pain and coughing.  He reports pain with coughing, he seems somewhat comfortable between coughing spells but well having episodes of coughing he is noted to be hypoxic tachypneic and appears quite short of breath.  He does have a history of COPD CHF and cardiac disease.  Will trial cough suppressants, steroids, inhaler, check Covid test, chest x-ray etc.  Also add on BNP and cardiac enzymes though my overall impression is this is likely noncardiac more likely infectious or inflammatory or exacerbation of COPD type symptomatology    ----------------------------------------- 7:30 PM on 10/19/2020 -----------------------------------------  Labs result demonstrating positive influenza test.  This combined with his shortness of breath and wheezing I suspect likely triggering COPD exacerbation.  He does not be obviously fluid overloaded compared to his baseline, no chest pain except with coughing.  Does  have a mild 2 L oxygen requirement will admit to the hospitalist, discussed with Dr. Alinda Money  Patient does feel improved with regard to coughing as well as chest discomfort after treatments here, will give additional albuterol.  Patient is at least 5 days out from symptom onset, defer to hospitalist this patient with regard to possible antivirals though I suspect there may not be much indication  ____________________________________________   FINAL CLINICAL IMPRESSION(S) / ED DIAGNOSES  Final diagnoses:  COPD exacerbation (HCC)  Influenza A        Note:  This document was prepared using Dragon voice recognition software and may include unintentional dictation errors       Sharyn Creamer, MD 10/19/20 1931

## 2020-10-20 DIAGNOSIS — I2510 Atherosclerotic heart disease of native coronary artery without angina pectoris: Secondary | ICD-10-CM | POA: Diagnosis present

## 2020-10-20 DIAGNOSIS — J09X2 Influenza due to identified novel influenza A virus with other respiratory manifestations: Secondary | ICD-10-CM | POA: Diagnosis not present

## 2020-10-20 DIAGNOSIS — F1410 Cocaine abuse, uncomplicated: Secondary | ICD-10-CM | POA: Diagnosis present

## 2020-10-20 DIAGNOSIS — E785 Hyperlipidemia, unspecified: Secondary | ICD-10-CM | POA: Diagnosis present

## 2020-10-20 DIAGNOSIS — I252 Old myocardial infarction: Secondary | ICD-10-CM | POA: Diagnosis not present

## 2020-10-20 DIAGNOSIS — J9601 Acute respiratory failure with hypoxia: Secondary | ICD-10-CM | POA: Diagnosis present

## 2020-10-20 DIAGNOSIS — I5032 Chronic diastolic (congestive) heart failure: Secondary | ICD-10-CM | POA: Diagnosis present

## 2020-10-20 DIAGNOSIS — Z87891 Personal history of nicotine dependence: Secondary | ICD-10-CM | POA: Diagnosis not present

## 2020-10-20 DIAGNOSIS — Z79899 Other long term (current) drug therapy: Secondary | ICD-10-CM | POA: Diagnosis not present

## 2020-10-20 DIAGNOSIS — G4733 Obstructive sleep apnea (adult) (pediatric): Secondary | ICD-10-CM | POA: Diagnosis present

## 2020-10-20 DIAGNOSIS — Y900 Blood alcohol level of less than 20 mg/100 ml: Secondary | ICD-10-CM | POA: Diagnosis not present

## 2020-10-20 DIAGNOSIS — Z20822 Contact with and (suspected) exposure to covid-19: Secondary | ICD-10-CM | POA: Diagnosis present

## 2020-10-20 DIAGNOSIS — K219 Gastro-esophageal reflux disease without esophagitis: Secondary | ICD-10-CM | POA: Diagnosis present

## 2020-10-20 DIAGNOSIS — J441 Chronic obstructive pulmonary disease with (acute) exacerbation: Secondary | ICD-10-CM | POA: Diagnosis present

## 2020-10-20 DIAGNOSIS — E119 Type 2 diabetes mellitus without complications: Secondary | ICD-10-CM | POA: Diagnosis present

## 2020-10-20 DIAGNOSIS — J101 Influenza due to other identified influenza virus with other respiratory manifestations: Secondary | ICD-10-CM | POA: Diagnosis present

## 2020-10-20 DIAGNOSIS — Z7984 Long term (current) use of oral hypoglycemic drugs: Secondary | ICD-10-CM | POA: Diagnosis not present

## 2020-10-20 DIAGNOSIS — E0590 Thyrotoxicosis, unspecified without thyrotoxic crisis or storm: Secondary | ICD-10-CM | POA: Diagnosis present

## 2020-10-20 DIAGNOSIS — I509 Heart failure, unspecified: Secondary | ICD-10-CM | POA: Diagnosis not present

## 2020-10-20 DIAGNOSIS — R7303 Prediabetes: Secondary | ICD-10-CM | POA: Diagnosis not present

## 2020-10-20 DIAGNOSIS — G8929 Other chronic pain: Secondary | ICD-10-CM | POA: Diagnosis present

## 2020-10-20 DIAGNOSIS — R0602 Shortness of breath: Secondary | ICD-10-CM | POA: Diagnosis present

## 2020-10-20 DIAGNOSIS — I110 Hypertensive heart disease with heart failure: Secondary | ICD-10-CM | POA: Diagnosis present

## 2020-10-20 DIAGNOSIS — M549 Dorsalgia, unspecified: Secondary | ICD-10-CM | POA: Diagnosis present

## 2020-10-20 DIAGNOSIS — J449 Chronic obstructive pulmonary disease, unspecified: Secondary | ICD-10-CM | POA: Diagnosis not present

## 2020-10-20 DIAGNOSIS — F32A Depression, unspecified: Secondary | ICD-10-CM | POA: Diagnosis present

## 2020-10-20 DIAGNOSIS — F1010 Alcohol abuse, uncomplicated: Secondary | ICD-10-CM | POA: Diagnosis present

## 2020-10-20 DIAGNOSIS — Z951 Presence of aortocoronary bypass graft: Secondary | ICD-10-CM | POA: Diagnosis not present

## 2020-10-20 DIAGNOSIS — F411 Generalized anxiety disorder: Secondary | ICD-10-CM | POA: Diagnosis present

## 2020-10-20 DIAGNOSIS — M1990 Unspecified osteoarthritis, unspecified site: Secondary | ICD-10-CM | POA: Diagnosis present

## 2020-10-20 DIAGNOSIS — Z7982 Long term (current) use of aspirin: Secondary | ICD-10-CM | POA: Diagnosis not present

## 2020-10-20 DIAGNOSIS — Z6841 Body Mass Index (BMI) 40.0 and over, adult: Secondary | ICD-10-CM | POA: Diagnosis not present

## 2020-10-20 DIAGNOSIS — Z96642 Presence of left artificial hip joint: Secondary | ICD-10-CM | POA: Diagnosis present

## 2020-10-20 LAB — BASIC METABOLIC PANEL
Anion gap: 8 (ref 5–15)
BUN: 20 mg/dL (ref 8–23)
CO2: 22 mmol/L (ref 22–32)
Calcium: 9 mg/dL (ref 8.9–10.3)
Chloride: 105 mmol/L (ref 98–111)
Creatinine, Ser: 1.16 mg/dL (ref 0.61–1.24)
GFR, Estimated: >60 mL/min (ref >60)
Glucose, Bld: 214 mg/dL — ABNORMAL HIGH (ref 70–99)
Potassium: 4.5 mmol/L (ref 3.5–5.1)
Sodium: 135 mmol/L (ref 135–145)

## 2020-10-20 LAB — GLUCOSE, CAPILLARY
Glucose-Capillary: 144 mg/dL — ABNORMAL HIGH (ref 70–99)
Glucose-Capillary: 171 mg/dL — ABNORMAL HIGH (ref 70–99)
Glucose-Capillary: 175 mg/dL — ABNORMAL HIGH (ref 70–99)
Glucose-Capillary: 215 mg/dL — ABNORMAL HIGH (ref 70–99)
Glucose-Capillary: 218 mg/dL — ABNORMAL HIGH (ref 70–99)

## 2020-10-20 LAB — HIV ANTIBODY (ROUTINE TESTING W REFLEX): HIV Screen 4th Generation wRfx: NONREACTIVE

## 2020-10-20 MED ORDER — ARFORMOTEROL TARTRATE 15 MCG/2ML IN NEBU
15.0000 ug | INHALATION_SOLUTION | Freq: Two times a day (BID) | RESPIRATORY_TRACT | Status: DC
  Administered 2020-10-20 – 2020-10-21 (×3): 15 ug via RESPIRATORY_TRACT
  Filled 2020-10-20 (×6): qty 2

## 2020-10-20 MED ORDER — EYE WASH OPHTH SOLN
2.0000 [drp] | OPHTHALMIC | Status: DC | PRN
  Filled 2020-10-20: qty 118

## 2020-10-20 MED ORDER — BENZONATATE 100 MG PO CAPS: 200.0000 mg | ORAL_CAPSULE | Freq: Three times a day (TID) | ORAL | Status: DC

## 2020-10-20 MED ORDER — METHYLPREDNISOLONE SODIUM SUCC 40 MG IJ SOLR
40.0000 mg | Freq: Three times a day (TID) | INTRAMUSCULAR | Status: DC
  Administered 2020-10-20 – 2020-10-22 (×7): 40 mg via INTRAVENOUS
  Filled 2020-10-20 (×7): qty 1

## 2020-10-20 MED ORDER — BUDESONIDE 0.25 MG/2ML IN SUSP
0.2500 mg | Freq: Two times a day (BID) | RESPIRATORY_TRACT | Status: DC
  Administered 2020-10-20 – 2020-10-22 (×5): 0.25 mg via RESPIRATORY_TRACT
  Filled 2020-10-20 (×5): qty 2

## 2020-10-20 MED ORDER — GUAIFENESIN-DM 100-10 MG/5ML PO SYRP
5.0000 mL | ORAL_SOLUTION | ORAL | Status: DC | PRN
  Administered 2020-10-20 – 2020-10-22 (×6): 5 mL via ORAL
  Filled 2020-10-20 (×6): qty 5

## 2020-10-20 MED ORDER — PANTOPRAZOLE SODIUM 40 MG PO TBEC
40.0000 mg | DELAYED_RELEASE_TABLET | Freq: Every day | ORAL | Status: DC
  Administered 2020-10-20 – 2020-10-22 (×3): 40 mg via ORAL
  Filled 2020-10-20 (×3): qty 1

## 2020-10-20 MED ORDER — HYDROCOD POLST-CPM POLST ER 10-8 MG/5ML PO SUER
5.0000 mL | Freq: Two times a day (BID) | ORAL | Status: DC
Start: 2020-10-20 — End: 2020-10-22
  Administered 2020-10-20 – 2020-10-22 (×5): 5 mL via ORAL
  Filled 2020-10-20 (×5): qty 5

## 2020-10-20 MED ORDER — BENZONATATE 100 MG PO CAPS
200.0000 mg | ORAL_CAPSULE | Freq: Three times a day (TID) | ORAL | Status: DC
  Administered 2020-10-20 – 2020-10-22 (×6): 200 mg via ORAL
  Filled 2020-10-20 (×6): qty 2

## 2020-10-20 MED ORDER — POLYVINYL ALCOHOL 1.4 % OP SOLN
1.0000 [drp] | Freq: Every day | OPHTHALMIC | Status: DC
  Administered 2020-10-20 – 2020-10-22 (×3): 1 [drp] via OPHTHALMIC
  Filled 2020-10-20: qty 15

## 2020-10-20 NOTE — Progress Notes (Signed)
PROGRESS NOTE    [name] LOUALLEN Sr.  IEP:[id number] DOB: 1956/07/16 DOA: 10/19/2020 PCP: Bubba Camp, MD  Brief Narrative:  65 y.o. male with medical history significant of CHF, anxiety, depression, back pain, COPD, hyperlipidemia, CAD status post NSTEMI, hypertension, alcohol use, cocaine use, hyperthyroidism, obesity, orthostatic syncope, OSA intolerant of CPAP, GERD presents with ongoing cough and new onset shortness of breath.             Patient has had 5 days of cough and reports shortness of breath which began about 1 hour prior to presentation to the ED.  He states his cough has been productive and he has some rib pain due to the frequency of his cough. He also reports some sweats and chills  Presentation is consistent with acute decompensated COPD.  Patient also was found to be influenza A positive.  Remains hemodynamically stable on room air.  Still with significant cough.   Assessment & Plan:   Principal Problem:   COPD exacerbation (HCC) Active Problems:   Dyslipidemia   Depression   ANXIETY DISORDER, GENERALIZED   Essential hypertension, benign   Coronary atherosclerosis of native coronary artery   Sleep apnea- C-pap intol   Prediabetes   Chronic back pain   (HFpEF) heart failure with preserved ejection fraction (HCC)   Influenza A  Influenza A Acute decompensated COPD Acute hypoxic respiratory failure secondary to above Obstructive sleep apnea, intolerant of home CPAP Patient states symptoms have been worsening for 5 days Shortness of breath acutely worsened the day prior to presentation Symptoms wheezing, shortness of breath, productive cough Positive for influenza A in ED Chest x-ray atelectasis versus mild edema Plan: Tamiflu unlikely to be of benefit considering chronicity of symptoms Solu-Medrol 40 mg IV every 8 hours Scheduled DuoNebs every 6 hours Hold home Symbicort Brovana twice daily Pulmicort twice daily Supplemental oxygen, goal  88-92% Azithromycin for inflammatory effect  Chronic diastolic congestive heart failure Hypertension No indication of acute exacerbation BNP normal Continue home Lasix Continue Imdur  Anxiety Depression Continue home Wellbutrin, Celexa, Remeron, trazodone, hydroxyzine, melatonin  Back pain Continue home oxycodone  Hyperlipidemia CAD  status post NSTEMI in CABG at [facility] which was complicated by MRSA wound infection. - Continue home aspirin  Diabetes/prediabetes - SSI  GERD - Continue PPI  Morbid obesity BMI 43 This complicates overall care and prognosis  DVT prophylaxis: SQ Lovenox Code Status: Full code Family Communication: None today Disposition Plan: Status is: Observation  The patient will require care spanning > 2 midnights and should be moved to inpatient because: Inpatient level of care appropriate due to severity of illness  Dispo: The patient is from: Home              Anticipated d/c is to: Home              Patient currently is not medically stable to d/c.   Difficult to place patient No  Acute decompensated COPD in the setting of influenza.  Disposition plan pending.  Anticipate return to previous home environment.     Level of care: Med-Surg  Consultants:   None  Procedures:   None  Antimicrobials:   Azithromycin   Subjective: Patient seen and examined.  Reports improvement in shortness of breath since admission.  Still with persistent cough, mildly productive.  Objective: Vitals:   10/20/20 0331 10/20/20 0500 10/20/20 0752 10/20/20 0755  BP: 137/70  (!) 141/68   Pulse: 75  82   Resp: 18  16  Temp: 97.6 F (36.4 C)  98.2 F (36.8 C)   TempSrc:      SpO2: 94%  95% 96%  Weight:  (!) 159 kg    Height:        Intake/Output Summary (Last 24 hours) at 10/20/2020 1050 Last data filed at 10/20/2020 0805 Gross per 24 hour  Intake -  Output 800 ml  Net -800 ml   Filed Weights   10/19/20 1716 10/20/20 0500  Weight: (!)  158.8 kg (!) 159 kg    Examination:  General exam: Appears calm and comfortable  Respiratory system: Decreased air entry bilaterally.  Scattered end expiratory wheeze.  Scattered crackles.  Normal work of breathing.  Room air Cardiovascular system: S1 & S2 heard, RRR. No JVD, murmurs, rubs, gallops or clicks. No pedal edema. Gastrointestinal system: Obese, nontender, nondistended, normal bowel sounds. Central nervous system: Alert and oriented. No focal neurological deficits. Extremities: Symmetric 5 x 5 power. Skin: No rashes, lesions or ulcers Psychiatry: Judgement and insight appear normal. Mood & affect appropriate.     Data Reviewed: I have personally reviewed following labs and imaging studies  CBC: Recent Labs  Lab 10/19/20 1720 10/19/20 1738  WBC 7.1 7.9  NEUTROABS  --  4.4  HGB 13.0 12.6*  HCT 39.3 37.9*  MCV 95.4 95.5  PLT 210 205   Basic Metabolic Panel: Recent Labs  Lab 10/19/20 1720 10/19/20 1738 10/20/20 0630  NA 136 135 135  K 4.1 4.1 4.5  CL 106 104 105  CO2 22 24 22   GLUCOSE 86 89 214*  BUN 20 21 20   CREATININE 1.20 1.23 1.16  CALCIUM 8.9 8.7* 9.0   GFR: Estimated Creatinine Clearance: 106.7 mL/min (by C-G formula based on SCr of 1.16 mg/dL). Liver Function Tests: Recent Labs  Lab 10/19/20 1738  AST 28  ALT 20  ALKPHOS 54  BILITOT 0.9  PROT 7.9  ALBUMIN 4.2   No results for input(s): LIPASE, AMYLASE in the last 168 hours. No results for input(s): AMMONIA in the last 168 hours. Coagulation Profile: Recent Labs  Lab 10/19/20 1738  INR 1.1   Cardiac Enzymes: No results for input(s): CKTOTAL, CKMB, CKMBINDEX, TROPONINI in the last 168 hours. BNP (last 3 results) No results for input(s): PROBNP in the last 8760 hours. HbA1C: No results for input(s): HGBA1C in the last 72 hours. CBG: Recent Labs  Lab 10/19/20 2153 10/20/20 0032 10/20/20 0747  GLUCAP 168* 215* 218*   Lipid Profile: No results for input(s): CHOL, HDL, LDLCALC,  TRIG, CHOLHDL, LDLDIRECT in the last 72 hours. Thyroid Function Tests: No results for input(s): TSH, T4TOTAL, FREET4, T3FREE, THYROIDAB in the last 72 hours. Anemia Panel: No results for input(s): VITAMINB12, FOLATE, FERRITIN, TIBC, IRON, RETICCTPCT in the last 72 hours. Sepsis Labs: Recent Labs  Lab 10/19/20 1738 10/19/20 1929  LATICACIDVEN 1.4 1.2    Recent Results (from the past 240 hour(s))  Resp Panel by RT-PCR (Flu A&B, Covid) Nasopharyngeal Swab     Status: Abnormal   Collection Time: 10/19/20  5:35 PM   Specimen: Nasopharyngeal Swab; Nasopharyngeal(NP) swabs in vial transport medium  Result Value Ref Range Status   SARS Coronavirus 2 by RT PCR NEGATIVE NEGATIVE Final    Comment: (NOTE) SARS-CoV-2 target nucleic acids are NOT DETECTED.  The SARS-CoV-2 RNA is generally detectable in upper respiratory specimens during the acute phase of infection. The lowest concentration of SARS-CoV-2 viral copies this assay can detect is 138 copies/mL. A negative result does not preclude  SARS-Cov-2 infection and should not be used as the sole basis for treatment or other patient management decisions. A negative result may occur with  improper specimen collection/handling, submission of specimen other than nasopharyngeal swab, presence of viral mutation(s) within the areas targeted by this assay, and inadequate number of viral copies(<138 copies/mL). A negative result must be combined with clinical observations, patient history, and epidemiological information. The expected result is Negative.  Fact Sheet for Patients:  BloggerCourse.com  Fact Sheet for Healthcare Providers:  SeriousBroker.it  This test is no t yet approved or cleared by the [place] FDA and  has been authorized for detection and/or diagnosis of SARS-CoV-2 by FDA under an Emergency Use Authorization (EUA). This EUA will remain  in effect (meaning this test can be  used) for the duration of the COVID-19 declaration under Section 564(b)(1) of the Act, 21 U.S.C.section 360bbb-3(b)(1), unless the authorization is terminated  or revoked sooner.       Influenza A by PCR POSITIVE (A) NEGATIVE Final   Influenza B by PCR NEGATIVE NEGATIVE Final    Comment: (NOTE) The Xpert Xpress SARS-CoV-2/FLU/RSV plus assay is intended as an aid in the diagnosis of influenza from Nasopharyngeal swab specimens and should not be used as a sole basis for treatment. Nasal washings and aspirates are unacceptable for Xpert Xpress SARS-CoV-2/FLU/RSV testing.  Fact Sheet for Patients: BloggerCourse.com  Fact Sheet for Healthcare Providers: SeriousBroker.it  This test is not yet approved or cleared by the [place] FDA and has been authorized for detection and/or diagnosis of SARS-CoV-2 by FDA under an Emergency Use Authorization (EUA). This EUA will remain in effect (meaning this test can be used) for the duration of the COVID-19 declaration under Section 564(b)(1) of the Act, 21 U.S.C. section 360bbb-3(b)(1), unless the authorization is terminated or revoked.  Performed at [facility], [address] [place], [place] [address]   Blood culture (routine single)     Status: None (Preliminary result)   Collection Time: 10/19/20  6:57 PM   Specimen: BLOOD  Result Value Ref Range Status   Specimen Description BLOOD BLOOD RIGHT HAND  Final   Special Requests   Final    BOTTLES DRAWN AEROBIC AND ANAEROBIC Blood Culture results may not be optimal due to an inadequate volume of blood received in culture bottles   Culture   Final    NO GROWTH < 12 HOURS Performed at [facility], [address], [place], [place] [address]    Report Status PENDING  Incomplete         Radiology Studies: DG Chest Port 1 View  Result Date: 10/19/2020 CLINICAL DATA:  Shortness of breath, cough. EXAM: PORTABLE  CHEST 1 VIEW COMPARISON:  September 08, 2020. FINDINGS: Mild cardiomegaly is noted. No pneumothorax or pleural effusion is noted. Mild bibasilar subsegmental atelectasis or edema is noted. Bony thorax is unremarkable. IMPRESSION: Mild bibasilar subsegmental atelectasis or edema. Electronically Signed   By: Lupita Raider M.D.   On: 10/19/2020 18:10        Scheduled Meds: . arformoterol  15 mcg Nebulization BID  . aspirin EC  81 mg Oral Daily  . azithromycin  250 mg Oral Daily  . benzonatate  200 mg Oral TID  . budesonide (PULMICORT) nebulizer solution  0.25 mg Nebulization BID  . buPROPion  300 mg Oral Daily  . chlorpheniramine-HYDROcodone  5 mL Oral Q12H  . citalopram  10 mg Oral Daily  . enoxaparin (LOVENOX) injection  80 mg  Subcutaneous Q24H  . furosemide  40 mg Oral q morning  . hydrOXYzine  50 mg Oral QHS  . insulin aspart  0-15 Units Subcutaneous TID WC  . ipratropium-albuterol  3 mL Nebulization Q6H WA  . isosorbide mononitrate  60 mg Oral Daily  . melatonin  5 mg Oral QHS  . methylPREDNISolone (SOLU-MEDROL) injection  40 mg Intravenous Q8H  . mirtazapine  15 mg Oral QHS  . pantoprazole  40 mg Oral Daily  . polyvinyl alcohol  1 drop Both Eyes Daily  . sodium chloride flush  3 mL Intravenous Q12H   Continuous Infusions:   LOS: 0 days    Time spent: 25 minutes    Tresa Moore, MD [facility] Pager [phone]  If 7PM-7AM, please contact night-coverage 10/20/2020, 10:50 AM

## 2020-10-21 LAB — URINE CULTURE: Culture: NO GROWTH

## 2020-10-21 LAB — GLUCOSE, CAPILLARY
Glucose-Capillary: 171 mg/dL — ABNORMAL HIGH (ref 70–99)
Glucose-Capillary: 208 mg/dL — ABNORMAL HIGH (ref 70–99)
Glucose-Capillary: 188 mg/dL — ABNORMAL HIGH (ref 70–99)
Glucose-Capillary: 157 mg/dL — ABNORMAL HIGH (ref 70–99)

## 2020-10-21 NOTE — Progress Notes (Signed)
PROGRESS NOTE    [name] GOLDSON Sr.  QIO:[id number] DOB: Jul 30, 1956 DOA: 10/19/2020 PCP: Bubba Camp, MD  Brief Narrative:  65 y.o. male with medical history significant of CHF, anxiety, depression, back pain, COPD, hyperlipidemia, CAD status post NSTEMI, hypertension, alcohol use, cocaine use, hyperthyroidism, obesity, orthostatic syncope, OSA intolerant of CPAP, GERD presents with ongoing cough and new onset shortness of breath.             Patient has had 5 days of cough and reports shortness of breath which began about 1 hour prior to presentation to the ED.  He states his cough has been productive and he has some rib pain due to the frequency of his cough. He also reports some sweats and chills  Presentation is consistent with acute decompensated COPD.  Patient also was found to be influenza A positive.  Remains hemodynamically stable on room air.  Still with significant cough.   Assessment & Plan:   Principal Problem:   COPD exacerbation (HCC) Active Problems:   Dyslipidemia   Depression   ANXIETY DISORDER, GENERALIZED   Essential hypertension, benign   Coronary atherosclerosis of native coronary artery   Sleep apnea- C-pap intol   Prediabetes   Chronic back pain   (HFpEF) heart failure with preserved ejection fraction (HCC)   Influenza A  Influenza A Acute decompensated COPD Acute hypoxic respiratory failure secondary to above Obstructive sleep apnea, intolerant of home CPAP Patient states symptoms have been worsening for 5 days Shortness of breath acutely worsened the day prior to presentation Symptoms wheezing, shortness of breath, productive cough Positive for influenza A in ED Chest x-ray atelectasis versus mild edema Plan: Tamiflu unlikely to be of benefit considering chronicity of symptoms Continue Solu-Medrol 40 mg IV every 8 hours.  Consider taper tomorrow as respiratory status improved Scheduled DuoNebs every 6 hours Hold home Symbicort Brovana twice  daily Pulmicort twice daily Supplemental oxygen, goal 88-92% Azithromycin for inflammatory effect As needed antitussives  Chronic diastolic congestive heart failure Hypertension No indication of acute exacerbation BNP normal Continue home Lasix Continue Imdur  Anxiety Depression Continue home Wellbutrin, Celexa, Remeron, trazodone, hydroxyzine, melatonin  Back pain Continue home oxycodone  Hyperlipidemia CAD  status post NSTEMI in CABG at [facility] which was complicated by MRSA wound infection. - Continue home aspirin  Diabetes/prediabetes - SSI  GERD - Continue PPI  Morbid obesity BMI 43 This complicates overall care and prognosis  DVT prophylaxis: SQ Lovenox Code Status: Full code Family Communication: Left VM for son Stacey Matejka [phone] on 3/1 Disposition Plan: Status is: Inpatient  Remains inpatient appropriate because:Inpatient level of care appropriate due to severity of illness   Dispo: The patient is from: Home              Anticipated d/c is to: Home              Patient currently is not medically stable to d/c.   Difficult to place patient No   Acute decompensated COPD in the setting of influenza A.  Respiratory status improving.  Patient on room air.  Anticipate he will be medically ready for discharge in 48 hours.           Level of care: Med-Surg  Consultants:   None  Procedures:   None  Antimicrobials:   Azithromycin   Subjective: Patient seen and examined.  Improved shortness of breath.  Cough persistent.  No pain complaints.  Objective: Vitals:   10/21/20 0100 10/21/20 0402 10/21/20  0752 10/21/20 0813  BP: (!) 149/57 (!) 162/68 140/77   Pulse: 64 74 71   Resp: 18 20 17    Temp: (!) 97.2 F (36.2 C) 98.6 F (37 C) 98.9 F (37.2 C)   TempSrc: Oral Oral Oral   SpO2: 94% 97% 95% 96%  Weight:  (!) 162.4 kg    Height:  6\' 3"  (1.905 m)      Intake/Output Summary (Last 24 hours) at 10/21/2020 1346 Last data filed  at 10/21/2020 1226 Gross per 24 hour  Intake 603 ml  Output 3000 ml  Net -2397 ml   Filed Weights   10/19/20 1716 10/20/20 0500 10/21/20 0402  Weight: (!) 158.8 kg (!) 159 kg (!) 162.4 kg    Examination:  General exam: Appears calm and comfortable  Respiratory system: Decreased air entry bilaterally.  Scattered end expiratory wheeze.  Scattered crackles.  Normal work of breathing.  Room air Cardiovascular system: S1 & S2 heard, RRR. No JVD, murmurs, rubs, gallops or clicks. No pedal edema. Gastrointestinal system: Obese, nontender, nondistended, normal bowel sounds. Central nervous system: Alert and oriented. No focal neurological deficits. Extremities: Symmetric 5 x 5 power. Skin: No rashes, lesions or ulcers Psychiatry: Judgement and insight appear normal. Mood & affect appropriate.     Data Reviewed: I have personally reviewed following labs and imaging studies  CBC: Recent Labs  Lab 10/19/20 1720 10/19/20 1738  WBC 7.1 7.9  NEUTROABS  --  4.4  HGB 13.0 12.6*  HCT 39.3 37.9*  MCV 95.4 95.5  PLT 210 205   Basic Metabolic Panel: Recent Labs  Lab 10/19/20 1720 10/19/20 1738 10/20/20 0630  NA 136 135 135  K 4.1 4.1 4.5  CL 106 104 105  CO2 22 24 22   GLUCOSE 86 89 214*  BUN 20 21 20   CREATININE 1.20 1.23 1.16  CALCIUM 8.9 8.7* 9.0   GFR: Estimated Creatinine Clearance: 108.1 mL/min (by C-G formula based on SCr of 1.16 mg/dL). Liver Function Tests: Recent Labs  Lab 10/19/20 1738  AST 28  ALT 20  ALKPHOS 54  BILITOT 0.9  PROT 7.9  ALBUMIN 4.2   No results for input(s): LIPASE, AMYLASE in the last 168 hours. No results for input(s): AMMONIA in the last 168 hours. Coagulation Profile: Recent Labs  Lab 10/19/20 1738  INR 1.1   Cardiac Enzymes: No results for input(s): CKTOTAL, CKMB, CKMBINDEX, TROPONINI in the last 168 hours. BNP (last 3 results) No results for input(s): PROBNP in the last 8760 hours. HbA1C: No results for input(s): HGBA1C in the  last 72 hours. CBG: Recent Labs  Lab 10/20/20 1145 10/20/20 1551 10/20/20 1957 10/21/20 0756 10/21/20 1222  GLUCAP 144* 175* 171* 171* 208*   Lipid Profile: No results for input(s): CHOL, HDL, LDLCALC, TRIG, CHOLHDL, LDLDIRECT in the last 72 hours. Thyroid Function Tests: No results for input(s): TSH, T4TOTAL, FREET4, T3FREE, THYROIDAB in the last 72 hours. Anemia Panel: No results for input(s): VITAMINB12, FOLATE, FERRITIN, TIBC, IRON, RETICCTPCT in the last 72 hours. Sepsis Labs: Recent Labs  Lab 10/19/20 1738 10/19/20 1929  LATICACIDVEN 1.4 1.2    Recent Results (from the past 240 hour(s))  Urine culture     Status: None   Collection Time: 10/19/20  5:34 PM   Specimen: In/Out Cath Urine  Result Value Ref Range Status   Specimen Description   Final    IN/OUT CATH URINE Performed at [facility], [address], [place], 10/21/20 [facility]    Special  Requests   Final    NONE Performed at [facility], [address], [place], [place] [address]    Culture   Final    NO GROWTH Performed at [facility], [address] [place], [place] [address]    Report Status 10/21/2020 FINAL  Final  Resp Panel by RT-PCR (Flu A&B, Covid) Nasopharyngeal Swab     Status: Abnormal   Collection Time: 10/19/20  5:35 PM   Specimen: Nasopharyngeal Swab; Nasopharyngeal(NP) swabs in vial transport medium  Result Value Ref Range Status   SARS Coronavirus 2 by RT PCR NEGATIVE NEGATIVE Final    Comment: (NOTE) SARS-CoV-2 target nucleic acids are NOT DETECTED.  The SARS-CoV-2 RNA is generally detectable in upper respiratory specimens during the acute phase of infection. The lowest concentration of SARS-CoV-2 viral copies this assay can detect is 138 copies/mL. A negative result does not preclude SARS-Cov-2 infection and should not be used as the sole basis for treatment or other patient management decisions. A negative result may occur with  improper specimen  collection/handling, submission of specimen other than nasopharyngeal swab, presence of viral mutation(s) within the areas targeted by this assay, and inadequate number of viral copies(<138 copies/mL). A negative result must be combined with clinical observations, patient history, and epidemiological information. The expected result is Negative.  Fact Sheet for Patients:  BloggerCourse.com  Fact Sheet for Healthcare Providers:  SeriousBroker.it  This test is no t yet approved or cleared by the [place] FDA and  has been authorized for detection and/or diagnosis of SARS-CoV-2 by FDA under an Emergency Use Authorization (EUA). This EUA will remain  in effect (meaning this test can be used) for the duration of the COVID-19 declaration under Section 564(b)(1) of the Act, 21 U.S.C.section 360bbb-3(b)(1), unless the authorization is terminated  or revoked sooner.       Influenza A by PCR POSITIVE (A) NEGATIVE Final   Influenza B by PCR NEGATIVE NEGATIVE Final    Comment: (NOTE) The Xpert Xpress SARS-CoV-2/FLU/RSV plus assay is intended as an aid in the diagnosis of influenza from Nasopharyngeal swab specimens and should not be used as a sole basis for treatment. Nasal washings and aspirates are unacceptable for Xpert Xpress SARS-CoV-2/FLU/RSV testing.  Fact Sheet for Patients: BloggerCourse.com  Fact Sheet for Healthcare Providers: SeriousBroker.it  This test is not yet approved or cleared by the [place] FDA and has been authorized for detection and/or diagnosis of SARS-CoV-2 by FDA under an Emergency Use Authorization (EUA). This EUA will remain in effect (meaning this test can be used) for the duration of the COVID-19 declaration under Section 564(b)(1) of the Act, 21 U.S.C. section 360bbb-3(b)(1), unless the authorization is terminated or revoked.  Performed at  [facility], [address] [place], [place] [address]   Blood culture (routine single)     Status: None (Preliminary result)   Collection Time: 10/19/20  6:57 PM   Specimen: BLOOD  Result Value Ref Range Status   Specimen Description BLOOD BLOOD RIGHT HAND  Final   Special Requests   Final    BOTTLES DRAWN AEROBIC AND ANAEROBIC Blood Culture results may not be optimal due to an inadequate volume of blood received in culture bottles   Culture   Final    NO GROWTH 2 DAYS Performed at [facility], [address] [place], [place] [address]    Report Status PENDING  Incomplete         Radiology Studies: DG Chest  Port 1 View  Result Date: 10/19/2020 CLINICAL DATA:  Shortness of breath, cough. EXAM: PORTABLE CHEST 1 VIEW COMPARISON:  September 08, 2020. FINDINGS: Mild cardiomegaly is noted. No pneumothorax or pleural effusion is noted. Mild bibasilar subsegmental atelectasis or edema is noted. Bony thorax is unremarkable. IMPRESSION: Mild bibasilar subsegmental atelectasis or edema. Electronically Signed   By: Lupita Raider M.D.   On: 10/19/2020 18:10        Scheduled Meds: . arformoterol  15 mcg Nebulization BID  . aspirin EC  81 mg Oral Daily  . azithromycin  250 mg Oral Daily  . benzonatate  200 mg Oral TID  . budesonide (PULMICORT) nebulizer solution  0.25 mg Nebulization BID  . buPROPion  300 mg Oral Daily  . chlorpheniramine-HYDROcodone  5 mL Oral Q12H  . citalopram  10 mg Oral Daily  . enoxaparin (LOVENOX) injection  80 mg Subcutaneous Q24H  . furosemide  40 mg Oral q morning  . hydrOXYzine  50 mg Oral QHS  . insulin aspart  0-15 Units Subcutaneous TID WC  . ipratropium-albuterol  3 mL Nebulization Q6H WA  . isosorbide mononitrate  60 mg Oral Daily  . melatonin  5 mg Oral QHS  . methylPREDNISolone (SOLU-MEDROL) injection  40 mg Intravenous Q8H  . mirtazapine  15 mg Oral QHS  . pantoprazole  40 mg Oral Daily  . polyvinyl alcohol  1 drop Both  Eyes Daily  . sodium chloride flush  3 mL Intravenous Q12H   Continuous Infusions:   LOS: 1 day    Time spent: 15 minutes    Tresa Moore, MD [facility] Pager [phone]  If 7PM-7AM, please contact night-coverage 10/21/2020, 1:46 PM

## 2020-10-21 NOTE — Plan of Care (Signed)

## 2020-10-22 ENCOUNTER — Observation Stay
Admission: EM | Admit: 2020-10-22 | Discharge: 2020-10-23 | Disposition: A | Discharge status: Home / Self Care | Payer: Medicare Other | Attending: Internal Medicine | Admitting: Internal Medicine

## 2020-10-22 ENCOUNTER — Other Ambulatory Visit: Payer: Self-pay

## 2020-10-22 DIAGNOSIS — J09X2 Influenza due to identified novel influenza A virus with other respiratory manifestations: Secondary | ICD-10-CM | POA: Insufficient documentation

## 2020-10-22 DIAGNOSIS — J9601 Acute respiratory failure with hypoxia: Principal | ICD-10-CM | POA: Insufficient documentation

## 2020-10-22 DIAGNOSIS — Z20822 Contact with and (suspected) exposure to covid-19: Secondary | ICD-10-CM | POA: Diagnosis not present

## 2020-10-22 DIAGNOSIS — J101 Influenza due to other identified influenza virus with other respiratory manifestations: Secondary | ICD-10-CM

## 2020-10-22 DIAGNOSIS — I110 Hypertensive heart disease with heart failure: Secondary | ICD-10-CM | POA: Insufficient documentation

## 2020-10-22 DIAGNOSIS — Z79899 Other long term (current) drug therapy: Secondary | ICD-10-CM | POA: Diagnosis not present

## 2020-10-22 DIAGNOSIS — I509 Heart failure, unspecified: Secondary | ICD-10-CM | POA: Insufficient documentation

## 2020-10-22 DIAGNOSIS — R7303 Prediabetes: Secondary | ICD-10-CM | POA: Insufficient documentation

## 2020-10-22 DIAGNOSIS — Z951 Presence of aortocoronary bypass graft: Secondary | ICD-10-CM | POA: Diagnosis not present

## 2020-10-22 DIAGNOSIS — Z87891 Personal history of nicotine dependence: Secondary | ICD-10-CM | POA: Diagnosis not present

## 2020-10-22 DIAGNOSIS — R059 Cough, unspecified: Secondary | ICD-10-CM | POA: Diagnosis not present

## 2020-10-22 DIAGNOSIS — Z96642 Presence of left artificial hip joint: Secondary | ICD-10-CM | POA: Insufficient documentation

## 2020-10-22 DIAGNOSIS — J449 Chronic obstructive pulmonary disease, unspecified: Secondary | ICD-10-CM | POA: Insufficient documentation

## 2020-10-22 DIAGNOSIS — Z7982 Long term (current) use of aspirin: Secondary | ICD-10-CM | POA: Insufficient documentation

## 2020-10-22 DIAGNOSIS — R0602 Shortness of breath: Secondary | ICD-10-CM | POA: Diagnosis not present

## 2020-10-22 DIAGNOSIS — J9811 Atelectasis: Secondary | ICD-10-CM | POA: Diagnosis not present

## 2020-10-22 DIAGNOSIS — Y900 Blood alcohol level of less than 20 mg/100 ml: Secondary | ICD-10-CM | POA: Insufficient documentation

## 2020-10-22 DIAGNOSIS — Z7984 Long term (current) use of oral hypoglycemic drugs: Secondary | ICD-10-CM | POA: Diagnosis not present

## 2020-10-22 DIAGNOSIS — I517 Cardiomegaly: Secondary | ICD-10-CM | POA: Diagnosis not present

## 2020-10-22 DIAGNOSIS — J441 Chronic obstructive pulmonary disease with (acute) exacerbation: Secondary | ICD-10-CM | POA: Diagnosis present

## 2020-10-22 DIAGNOSIS — I2510 Atherosclerotic heart disease of native coronary artery without angina pectoris: Secondary | ICD-10-CM | POA: Diagnosis not present

## 2020-10-22 LAB — GLUCOSE, CAPILLARY: Glucose-Capillary: 157 mg/dL — ABNORMAL HIGH (ref 70–99)

## 2020-10-22 LAB — CBC WITH DIFFERENTIAL/PLATELET
Abs Immature Granulocytes: 0.17 10*3/uL — ABNORMAL HIGH (ref 0.00–0.07)
Basophils Absolute: 0 10*3/uL (ref 0.0–0.1)
Basophils Relative: 0 %
Eosinophils Absolute: 0 10*3/uL (ref 0.0–0.5)
Eosinophils Relative: 0 %
HCT: 39.6 % (ref 39.0–52.0)
Hemoglobin: 13 g/dL (ref 13.0–17.0)
Immature Granulocytes: 1 %
Lymphocytes Relative: 14 %
Lymphs Abs: 1.8 10*3/uL (ref 0.7–4.0)
MCH: 31.6 pg (ref 26.0–34.0)
MCHC: 32.8 g/dL (ref 30.0–36.0)
MCV: 96.4 fL (ref 80.0–100.0)
Monocytes Absolute: 1 10*3/uL (ref 0.1–1.0)
Monocytes Relative: 8 %
Neutro Abs: 10 10*3/uL — ABNORMAL HIGH (ref 1.7–7.7)
Neutrophils Relative %: 77 %
Platelets: 238 10*3/uL (ref 150–400)
RBC: 4.11 MIL/uL — ABNORMAL LOW (ref 4.22–5.81)
RDW: 13.1 % (ref 11.5–15.5)
WBC: 13 10*3/uL — ABNORMAL HIGH (ref 4.0–10.5)
nRBC: 0 % (ref 0.0–0.2)

## 2020-10-22 LAB — BASIC METABOLIC PANEL
Anion gap: 7 (ref 5–15)
BUN: 26 mg/dL — ABNORMAL HIGH (ref 8–23)
CO2: 26 mmol/L (ref 22–32)
Calcium: 8.8 mg/dL — ABNORMAL LOW (ref 8.9–10.3)
Chloride: 105 mmol/L (ref 98–111)
Creatinine, Ser: 1.13 mg/dL (ref 0.61–1.24)
GFR, Estimated: >60 mL/min (ref >60)
Glucose, Bld: 173 mg/dL — ABNORMAL HIGH (ref 70–99)
Potassium: 4.3 mmol/L (ref 3.5–5.1)
Sodium: 138 mmol/L (ref 135–145)

## 2020-10-22 MED ORDER — PREDNISONE 20 MG PO TABS: 40.0000 mg | ORAL_TABLET | Freq: Every day | ORAL | 0 refills | Status: AC

## 2020-10-22 MED ORDER — ALBUTEROL SULFATE (2.5 MG/3ML) 0.083% IN NEBU: 2.5000 mg | INHALATION_SOLUTION | Freq: Four times a day (QID) | RESPIRATORY_TRACT | 0 refills | Status: AC | PRN

## 2020-10-22 MED ORDER — HYDROCODONE-HOMATROPINE 5-1.5 MG/5ML PO SYRP: 5.0000 mL | ORAL_SOLUTION | Freq: Four times a day (QID) | ORAL | 0 refills | Status: AC | PRN

## 2020-10-22 MED ORDER — IPRATROPIUM-ALBUTEROL 0.5-2.5 (3) MG/3ML IN SOLN
3.0000 mL | Freq: Once | RESPIRATORY_TRACT | Status: AC
  Administered 2020-10-23: 3 mL via RESPIRATORY_TRACT

## 2020-10-22 MED ORDER — METHYLPREDNISOLONE SODIUM SUCC 125 MG IJ SOLR
125.0000 mg | Freq: Once | INTRAMUSCULAR | Status: AC
  Administered 2020-10-23: 125 mg via INTRAVENOUS
  Filled 2020-10-22: qty 2

## 2020-10-22 MED ORDER — IPRATROPIUM-ALBUTEROL 0.5-2.5 (3) MG/3ML IN SOLN
3.0000 mL | Freq: Once | RESPIRATORY_TRACT | Status: AC
  Administered 2020-10-23: 3 mL via RESPIRATORY_TRACT
  Filled 2020-10-22: qty 9

## 2020-10-22 NOTE — Progress Notes (Signed)
Alert and oriented x 4. C/o chronic generalized back pain. C/o cough. Pain and and cough medication given with some relief. Continues on droplet isolation at this time. IV steroids given. Pt states feeling better overall. Will continue plan of care.

## 2020-10-22 NOTE — ED Triage Notes (Signed)
Pt BIBA via [facility] from home c/o of shortness of breath. Pt states he was recently d/c from hospital for PNA. EMS reports that pt had coughing fit at home and passed out for 10-15 mins. Pt regained consciousness while en route.

## 2020-10-22 NOTE — Discharge Summary (Signed)
Physician Discharge Summary  [name]. EXN:[id number] DOB: 1956-07-14 DOA: 10/19/2020  PCP: Bubba Camp, MD  Admit date: 10/19/2020 Discharge date: 10/22/2020  Discharge disposition: Home   Recommendations for Outpatient Follow-Up:   Follow-up with PCP in 1 week   Discharge Diagnosis:   Principal Problem:   COPD exacerbation (HCC) Active Problems:   Dyslipidemia   Depression   ANXIETY DISORDER, GENERALIZED   Essential hypertension, benign   Coronary atherosclerosis of native coronary artery   Sleep apnea- C-pap intol   Prediabetes   Chronic back pain   (HFpEF) heart failure with preserved ejection fraction (HCC)   Influenza A    Discharge Condition: Stable.  Diet recommendation:  Diet Order            Diet - low sodium heart healthy           Diet Carb Modified           Diet heart healthy/carb modified Room service appropriate? Yes; Fluid consistency: Thin; Fluid restriction: 1800 mL Fluid  Diet effective now                   Code Status: Full Code     Hospital Course:   [name] medical history significant ofCHF, anxiety, depression, back pain, COPD, hyperlipidemia, CAD status post NSTEMI, hypertension, alcohol use, cocaine use, hyperthyroidism, obesity, orthostatic syncope, OSA intolerant of CPAP, GERD, who presented to the hospital with cough and shortness of breath.  He was admitted to the hospital for acute hypoxic respiratory failure secondary to COPD exacerbation and influenza A infection.  He was treated with oxygen, IV steroids and bronchodilators.  He was not treated with Tamiflu because his symptoms had been going on for about 5 days prior to presentation.  His condition improved significantly.  He felt better and he was eager to be discharged home.  He is deemed stable for discharge.     Discharge Exam:    Vitals:   10/21/20 2041 10/22/20 0429 10/22/20 0730 10/22/20 0817  BP: (!) 138/122 (!) 135/54  137/68   Pulse: (!) 102 71  70  Resp: 17 16  19   Temp: 98.2 F (36.8 C) 97.6 F (36.4 C)  98 F (36.7 C)  TempSrc:    Oral  SpO2: 95% 95% 95% 93%  Weight:      Height:         GEN: NAD SKIN: Warm and dry EYES: EOMI ENT: MMM CV: RRR PULM: CTA B ABD: soft, obese, NT, +BS CNS: AAO x 3, non focal EXT: No edema or tenderness   The results of significant diagnostics from this hospitalization (including imaging, microbiology, ancillary and laboratory) are listed below for reference.     Procedures and Diagnostic Studies:   DG Chest Port 1 View  Result Date: 10/19/2020 CLINICAL DATA:  Shortness of breath, cough. EXAM: PORTABLE CHEST 1 VIEW COMPARISON:  September 08, 2020. FINDINGS: Mild cardiomegaly is noted. No pneumothorax or pleural effusion is noted. Mild bibasilar subsegmental atelectasis or edema is noted. Bony thorax is unremarkable. IMPRESSION: Mild bibasilar subsegmental atelectasis or edema. Electronically Signed   By: September 10, 2020 M.D.   On: 10/19/2020 18:10     Labs:   Basic Metabolic Panel: Recent Labs  Lab 10/19/20 1720 10/19/20 1738 10/20/20 0630 10/22/20 0658  NA 136 135 135 138  K 4.1 4.1 4.5 4.3  CL 106 104 105 105  CO2 22 24 22 26   GLUCOSE 86 89 214*  173*  BUN 20 21 20  26*  CREATININE 1.20 1.23 1.16 1.13  CALCIUM 8.9 8.7* 9.0 8.8*   GFR Estimated Creatinine Clearance: 110.9 mL/min (by C-G formula based on SCr of 1.13 mg/dL). Liver Function Tests: Recent Labs  Lab 10/19/20 1738  AST 28  ALT 20  ALKPHOS 54  BILITOT 0.9  PROT 7.9  ALBUMIN 4.2   No results for input(s): LIPASE, AMYLASE in the last 168 hours. No results for input(s): AMMONIA in the last 168 hours. Coagulation profile Recent Labs  Lab 10/19/20 1738  INR 1.1    CBC: Recent Labs  Lab 10/19/20 1720 10/19/20 1738 10/22/20 0658  WBC 7.1 7.9 13.0*  NEUTROABS  --  4.4 10.0*  HGB 13.0 12.6* 13.0  HCT 39.3 37.9* 39.6  MCV 95.4 95.5 96.4  PLT 210 205 238   Cardiac  Enzymes: No results for input(s): CKTOTAL, CKMB, CKMBINDEX, TROPONINI in the last 168 hours. BNP: Invalid input(s): POCBNP CBG: Recent Labs  Lab 10/21/20 0756 10/21/20 1222 10/21/20 1644 10/21/20 2035 10/22/20 0818  GLUCAP 171* 208* 188* 157* 157*   D-Dimer No results for input(s): DDIMER in the last 72 hours. Hgb A1c No results for input(s): HGBA1C in the last 72 hours. Lipid Profile No results for input(s): CHOL, HDL, LDLCALC, TRIG, CHOLHDL, LDLDIRECT in the last 72 hours. Thyroid function studies No results for input(s): TSH, T4TOTAL, T3FREE, THYROIDAB in the last 72 hours.  Invalid input(s): FREET3 Anemia work up No results for input(s): VITAMINB12, FOLATE, FERRITIN, TIBC, IRON, RETICCTPCT in the last 72 hours. Microbiology Recent Results (from the past 240 hour(s))  Urine culture     Status: None   Collection Time: 10/19/20  5:34 PM   Specimen: In/Out Cath Urine  Result Value Ref Range Status   Specimen Description   Final    IN/OUT CATH URINE Performed at [facility], [address], [place], [place]    Special Requests   Final    NONE Performed at [facility], [address] [place], [place]    Culture   Final    NO GROWTH Performed at [facility], [address], [place], [place]    Report Status 10/21/2020 FINAL  Final  Resp Panel by RT-PCR (Flu A&B, Covid) Nasopharyngeal Swab     Status: Abnormal   Collection Time: 10/19/20  5:35 PM   Specimen: Nasopharyngeal Swab; Nasopharyngeal(NP) swabs in vial transport medium  Result Value Ref Range Status   SARS Coronavirus 2 by RT PCR NEGATIVE NEGATIVE Final    Comment: (NOTE) SARS-CoV-2 target nucleic acids are NOT DETECTED.  The SARS-CoV-2 RNA is generally detectable in upper respiratory specimens during the acute phase of infection. The lowest concentration of SARS-CoV-2 viral copies this assay can detect is 138 copies/mL. A negative result does not  preclude SARS-Cov-2 infection and should not be used as the sole basis for treatment or other patient management decisions. A negative result may occur with  improper specimen collection/handling, submission of specimen other than nasopharyngeal swab, presence of viral mutation(s) within the areas targeted by this assay, and inadequate number of viral copies(<138 copies/mL). A negative result must be combined with clinical observations, patient history, and epidemiological information. The expected result is Negative.  Fact Sheet for Patients:  10/21/20  Fact Sheet for Healthcare Providers:  BloggerCourse.com  This test is no t yet approved or cleared by the [place] FDA and  has been authorized for detection and/or diagnosis  of SARS-CoV-2 by FDA under an Emergency Use Authorization (EUA). This EUA will remain  in effect (meaning this test can be used) for the duration of the COVID-19 declaration under Section 564(b)(1) of the Act, 21 U.S.C.section 360bbb-3(b)(1), unless the authorization is terminated  or revoked sooner.       Influenza A by PCR POSITIVE (A) NEGATIVE Final   Influenza B by PCR NEGATIVE NEGATIVE Final    Comment: (NOTE) The Xpert Xpress SARS-CoV-2/FLU/RSV plus assay is intended as an aid in the diagnosis of influenza from Nasopharyngeal swab specimens and should not be used as a sole basis for treatment. Nasal washings and aspirates are unacceptable for Xpert Xpress SARS-CoV-2/FLU/RSV testing.  Fact Sheet for Patients: BloggerCourse.com  Fact Sheet for Healthcare Providers: SeriousBroker.it  This test is not yet approved or cleared by the [place] FDA and has been authorized for detection and/or diagnosis of SARS-CoV-2 by FDA under an Emergency Use Authorization (EUA). This EUA will remain in effect (meaning this test can be used) for the  duration of the COVID-19 declaration under Section 564(b)(1) of the Act, 21 U.S.C. section 360bbb-3(b)(1), unless the authorization is terminated or revoked.  Performed at [facility], [address] [place], [place] [address]   Blood culture (routine single)     Status: None (Preliminary result)   Collection Time: 10/19/20  6:57 PM   Specimen: BLOOD  Result Value Ref Range Status   Specimen Description BLOOD BLOOD RIGHT HAND  Final   Special Requests   Final    BOTTLES DRAWN AEROBIC AND ANAEROBIC Blood Culture results may not be optimal due to an inadequate volume of blood received in culture bottles   Culture   Final    NO GROWTH 3 DAYS Performed at [facility], [address] [place], [place] [address]    Report Status PENDING  Incomplete     Discharge Instructions:   Discharge Instructions    Diet - low sodium heart healthy   Complete by: As directed    Diet Carb Modified   Complete by: As directed    Increase activity slowly   Complete by: As directed      Allergies as of 10/22/2020      Reactions   Fluticasone Furoate-vilanterol Swelling   Tongue Swelling   Tiotropium Swelling   Tongue Swelling   Ativan [lorazepam] Other (See Comments)   Altered Mental Status   Haloperidol Decanoate Other (See Comments)   Altered Mental Status   Atorvastatin Other (See Comments)   Pain to joints   Sulfamethoxazole-trimethoprim Itching      Medication List    TAKE these medications   aspirin 81 MG tablet Take 1 tablet (81 mg total) by mouth daily.   budesonide-formoterol 160-4.5 MCG/ACT inhaler Commonly known as: SYMBICORT Inhale 2 puffs into the lungs 2 (two) times daily.   buPROPion 150 MG 24 hr tablet Commonly known as: WELLBUTRIN XL Take 300 mg by mouth daily.   citalopram 10 MG tablet Commonly known as: CELEXA Take 10 mg by mouth daily.   [employer] Melatonin 3 MG Tabs tablet Generic drug: melatonin Take 1 tablet by mouth at bedtime.    fluticasone 50 MCG/ACT nasal spray Commonly known as: FLONASE Place 2 sprays into both nostrils 2 (two) times daily.   furosemide 20 MG tablet Commonly known as: LASIX Take 40 mg by mouth every morning.   HYDROcodone-homatropine 5-1.5 MG/5ML syrup Commonly known as: HYCODAN Take 5 mLs by mouth every 6 (six) hours  as needed for cough.   hydrOXYzine 25 MG tablet Commonly known as: ATARAX/VISTARIL Take 50 mg by mouth at bedtime.   isosorbide mononitrate 60 MG 24 hr tablet Commonly known as: IMDUR Take 60 mg by mouth daily.   metFORMIN 500 MG 24 hr tablet Commonly known as: GLUCOPHAGE-XR Take 500 mg by mouth daily before supper.   mirtazapine 15 MG tablet Commonly known as: REMERON Take 15 mg by mouth at bedtime.   nitroGLYCERIN 0.4 MG SL tablet Commonly known as: NITROSTAT Place 0.4 mg under the tongue every 5 (five) minutes as needed. For chest pain   omeprazole 20 MG capsule Commonly known as: PRILOSEC Take 20 mg by mouth every morning.   oxyCODONE-acetaminophen 10-325 MG tablet Commonly known as: PERCOCET Take 1 tablet by mouth every 4 (four) hours as needed for pain.   predniSONE 20 MG tablet Commonly known as: DELTASONE Take 2 tablets (40 mg total) by mouth daily for 3 days.   ProAir HFA 108 (90 Base) MCG/ACT inhaler Generic drug: albuterol Inhale 2 puffs into the lungs every 4 (four) hours as needed for wheezing or shortness of breath. What changed: Another medication with the same name was added. Make sure you understand how and when to take each.   albuterol (2.5 MG/3ML) 0.083% nebulizer solution Commonly known as: PROVENTIL Take 3 mLs (2.5 mg total) by nebulization every 6 (six) hours as needed for wheezing or shortness of breath. What changed: You were already taking a medication with the same name, and this prescription was added. Make sure you understand how and when to take each.   Repatha SureClick 140 MG/ML Soaj Generic drug: Evolocumab Inject 140  mg into the skin every 14 (fourteen) days.   triamcinolone ointment 0.1 % Commonly known as: KENALOG Apply 1 application topically 2 (two) times daily.         Time coordinating discharge: 32 minutes  Signed:  Delois Tolbert  [facility] 10/22/2020, 10:56 AM   Pager on [email or web address]. If 7PM-7AM, please contact night-coverage at [email or web address]

## 2020-10-23 ENCOUNTER — Observation Stay (HOSPITAL_BASED_OUTPATIENT_CLINIC_OR_DEPARTMENT_OTHER)
Admit: 2020-10-23 | Discharge: 2020-10-23 | Disposition: A | Discharge status: Home / Self Care | Payer: Medicare Other | Attending: Family Medicine | Admitting: Family Medicine

## 2020-10-23 ENCOUNTER — Emergency Department: Payer: Medicare Other

## 2020-10-23 DIAGNOSIS — N179 Acute kidney failure, unspecified: Secondary | ICD-10-CM

## 2020-10-23 DIAGNOSIS — I5043 Acute on chronic combined systolic (congestive) and diastolic (congestive) heart failure: Secondary | ICD-10-CM

## 2020-10-23 DIAGNOSIS — J9601 Acute respiratory failure with hypoxia: Secondary | ICD-10-CM | POA: Diagnosis not present

## 2020-10-23 DIAGNOSIS — J9811 Atelectasis: Secondary | ICD-10-CM | POA: Diagnosis not present

## 2020-10-23 DIAGNOSIS — J101 Influenza due to other identified influenza virus with other respiratory manifestations: Secondary | ICD-10-CM | POA: Diagnosis not present

## 2020-10-23 DIAGNOSIS — I5031 Acute diastolic (congestive) heart failure: Secondary | ICD-10-CM | POA: Diagnosis not present

## 2020-10-23 DIAGNOSIS — I5033 Acute on chronic diastolic (congestive) heart failure: Secondary | ICD-10-CM | POA: Diagnosis not present

## 2020-10-23 DIAGNOSIS — R0602 Shortness of breath: Secondary | ICD-10-CM | POA: Diagnosis not present

## 2020-10-23 DIAGNOSIS — I110 Hypertensive heart disease with heart failure: Secondary | ICD-10-CM | POA: Diagnosis not present

## 2020-10-23 DIAGNOSIS — R059 Cough, unspecified: Secondary | ICD-10-CM | POA: Diagnosis not present

## 2020-10-23 DIAGNOSIS — J441 Chronic obstructive pulmonary disease with (acute) exacerbation: Secondary | ICD-10-CM

## 2020-10-23 DIAGNOSIS — I517 Cardiomegaly: Secondary | ICD-10-CM | POA: Diagnosis not present

## 2020-10-23 DIAGNOSIS — I509 Heart failure, unspecified: Secondary | ICD-10-CM | POA: Diagnosis not present

## 2020-10-23 LAB — BRAIN NATRIURETIC PEPTIDE: B Natriuretic Peptide: 339.3 pg/mL — ABNORMAL HIGH (ref 0.0–100.0)

## 2020-10-23 LAB — BLOOD GAS, VENOUS
Acid-base deficit: 0.4 mmol/L (ref 0.0–2.0)
Bicarbonate: 24.8 mmol/L (ref 20.0–28.0)
O2 Saturation: 95.3 %
Patient temperature: 37
pCO2, Ven: 42 mmHg — ABNORMAL LOW (ref 44.0–60.0)
pH, Ven: 7.38 (ref 7.250–7.430)
pO2, Ven: 79 mmHg — ABNORMAL HIGH (ref 32.0–45.0)

## 2020-10-23 LAB — CBC
HCT: 37.5 % — ABNORMAL LOW (ref 39.0–52.0)
Hemoglobin: 12.3 g/dL — ABNORMAL LOW (ref 13.0–17.0)
MCH: 31.4 pg (ref 26.0–34.0)
MCHC: 32.8 g/dL (ref 30.0–36.0)
MCV: 95.7 fL (ref 80.0–100.0)
Platelets: 235 10*3/uL (ref 150–400)
RBC: 3.92 MIL/uL — ABNORMAL LOW (ref 4.22–5.81)
RDW: 13.1 % (ref 11.5–15.5)
WBC: 12.2 10*3/uL — ABNORMAL HIGH (ref 4.0–10.5)
nRBC: 0 % (ref 0.0–0.2)

## 2020-10-23 LAB — CBC WITH DIFFERENTIAL/PLATELET
Abs Immature Granulocytes: 0.28 10*3/uL — ABNORMAL HIGH (ref 0.00–0.07)
Basophils Absolute: 0.1 10*3/uL (ref 0.0–0.1)
Basophils Relative: 0 %
Eosinophils Absolute: 0 10*3/uL (ref 0.0–0.5)
Eosinophils Relative: 0 %
HCT: 37.6 % — ABNORMAL LOW (ref 39.0–52.0)
Hemoglobin: 12.5 g/dL — ABNORMAL LOW (ref 13.0–17.0)
Immature Granulocytes: 2 %
Lymphocytes Relative: 12 %
Lymphs Abs: 1.7 10*3/uL (ref 0.7–4.0)
MCH: 31.9 pg (ref 26.0–34.0)
MCHC: 33.2 g/dL (ref 30.0–36.0)
MCV: 95.9 fL (ref 80.0–100.0)
Monocytes Absolute: 1.3 10*3/uL — ABNORMAL HIGH (ref 0.1–1.0)
Monocytes Relative: 9 %
Neutro Abs: 11.1 10*3/uL — ABNORMAL HIGH (ref 1.7–7.7)
Neutrophils Relative %: 77 %
Platelets: 255 10*3/uL (ref 150–400)
RBC: 3.92 MIL/uL — ABNORMAL LOW (ref 4.22–5.81)
RDW: 13 % (ref 11.5–15.5)
WBC: 14.5 10*3/uL — ABNORMAL HIGH (ref 4.0–10.5)
nRBC: 0 % (ref 0.0–0.2)

## 2020-10-23 LAB — COMPREHENSIVE METABOLIC PANEL
ALT: 43 U/L (ref 0–44)
AST: 44 U/L — ABNORMAL HIGH (ref 15–41)
Albumin: 3.8 g/dL (ref 3.5–5.0)
Alkaline Phosphatase: 50 U/L (ref 38–126)
Anion gap: 11 (ref 5–15)
BUN: 27 mg/dL — ABNORMAL HIGH (ref 8–23)
CO2: 21 mmol/L — ABNORMAL LOW (ref 22–32)
Calcium: 8.5 mg/dL — ABNORMAL LOW (ref 8.9–10.3)
Chloride: 105 mmol/L (ref 98–111)
Creatinine, Ser: 1.32 mg/dL — ABNORMAL HIGH (ref 0.61–1.24)
GFR, Estimated: >60 mL/min (ref >60)
Glucose, Bld: 174 mg/dL — ABNORMAL HIGH (ref 70–99)
Potassium: 3.6 mmol/L (ref 3.5–5.1)
Sodium: 137 mmol/L (ref 135–145)
Total Bilirubin: 0.4 mg/dL (ref 0.3–1.2)
Total Protein: 7.4 g/dL (ref 6.5–8.1)

## 2020-10-23 LAB — ECHOCARDIOGRAM COMPLETE
AR max vel: 2.88 cm2
AV Area VTI: 3.02 cm2
AV Area mean vel: 2.98 cm2
AV Mean grad: 6 mmHg
AV Peak grad: 12.1 mmHg
Ao pk vel: 1.74 m/s
Area-P 1/2: 3.17 cm2
MV VTI: 3.4 cm2
S' Lateral: 3.5 cm

## 2020-10-23 LAB — RESP PANEL BY RT-PCR (FLU A&B, COVID) ARPGX2
Influenza A by PCR: POSITIVE — AB
Influenza B by PCR: NEGATIVE
SARS Coronavirus 2 by RT PCR: NEGATIVE

## 2020-10-23 LAB — ETHANOL: Alcohol, Ethyl (B): 79 mg/dL — ABNORMAL HIGH (ref <10)

## 2020-10-23 LAB — BASIC METABOLIC PANEL
Anion gap: 10 (ref 5–15)
BUN: 29 mg/dL — ABNORMAL HIGH (ref 8–23)
CO2: 25 mmol/L (ref 22–32)
Calcium: 8.5 mg/dL — ABNORMAL LOW (ref 8.9–10.3)
Chloride: 103 mmol/L (ref 98–111)
Creatinine, Ser: 1.35 mg/dL — ABNORMAL HIGH (ref 0.61–1.24)
GFR, Estimated: 59 mL/min — ABNORMAL LOW (ref >60)
Glucose, Bld: 220 mg/dL — ABNORMAL HIGH (ref 70–99)
Potassium: 3.8 mmol/L (ref 3.5–5.1)
Sodium: 138 mmol/L (ref 135–145)

## 2020-10-23 LAB — URINE DRUG SCREEN, QUALITATIVE (ARMC ONLY)
Amphetamines, Ur Screen: NOT DETECTED
Barbiturates, Ur Screen: NOT DETECTED
Benzodiazepine, Ur Scrn: NOT DETECTED
Cannabinoid 50 Ng, Ur ~~LOC~~: NOT DETECTED
Cocaine Metabolite,Ur ~~LOC~~: NOT DETECTED
MDMA (Ecstasy)Ur Screen: NOT DETECTED
Methadone Scn, Ur: NOT DETECTED
Opiate, Ur Screen: POSITIVE — AB
Phencyclidine (PCP) Ur S: NOT DETECTED
Tricyclic, Ur Screen: NOT DETECTED

## 2020-10-23 LAB — PROCALCITONIN: Procalcitonin: <0.1 ng/mL

## 2020-10-23 LAB — TROPONIN I (HIGH SENSITIVITY)
Troponin I (High Sensitivity): 7 ng/L (ref <18)
Troponin I (High Sensitivity): 7 ng/L

## 2020-10-23 MED ORDER — ENOXAPARIN SODIUM 80 MG/0.8ML ~~LOC~~ SOLN
80.0000 mg | SUBCUTANEOUS | Status: DC
  Administered 2020-10-23: 80 mg via SUBCUTANEOUS
  Filled 2020-10-23: qty 0.8

## 2020-10-23 MED ORDER — HYDROCOD POLST-CPM POLST ER 10-8 MG/5ML PO SUER
5.0000 mL | Freq: Once | ORAL | Status: AC
  Administered 2020-10-23: 5 mL via ORAL
  Filled 2020-10-23: qty 5

## 2020-10-23 MED ORDER — HYDROXYZINE HCL 25 MG PO TABS: 50.0000 mg | ORAL_TABLET | Freq: Every day | ORAL | Status: DC

## 2020-10-23 MED ORDER — OXYCODONE-ACETAMINOPHEN 5-325 MG PO TABS
1.0000 | ORAL_TABLET | ORAL | Status: DC | PRN
  Administered 2020-10-23: 1 via ORAL
  Filled 2020-10-23 (×2): qty 1

## 2020-10-23 MED ORDER — HYDROCODONE-HOMATROPINE 5-1.5 MG/5ML PO SYRP
5.0000 mL | ORAL_SOLUTION | Freq: Four times a day (QID) | ORAL | Status: DC | PRN
  Administered 2020-10-23: 5 mL via ORAL
  Filled 2020-10-23: qty 5

## 2020-10-23 MED ORDER — ONDANSETRON HCL 4 MG PO TABS: 4.0000 mg | ORAL_TABLET | Freq: Four times a day (QID) | ORAL | Status: DC | PRN

## 2020-10-23 MED ORDER — MIRTAZAPINE 15 MG PO TABS: 15.0000 mg | ORAL_TABLET | Freq: Every day | ORAL | Status: DC

## 2020-10-23 MED ORDER — BUPROPION HCL ER (XL) 150 MG PO TB24
300.0000 mg | ORAL_TABLET | Freq: Every day | ORAL | Status: DC
  Administered 2020-10-23: 300 mg via ORAL
  Filled 2020-10-23: qty 2

## 2020-10-23 MED ORDER — SODIUM CHLORIDE 0.9 % IV SOLN
1.0000 g | INTRAVENOUS | Status: DC
  Administered 2020-10-23: 1 g via INTRAVENOUS
  Filled 2020-10-23: qty 10

## 2020-10-23 MED ORDER — TRAZODONE HCL 50 MG PO TABS: 25.0000 mg | ORAL_TABLET | Freq: Every evening | ORAL | Status: DC | PRN

## 2020-10-23 MED ORDER — FUROSEMIDE 10 MG/ML IJ SOLN: 40.0000 mg | Freq: Two times a day (BID) | INTRAMUSCULAR | Status: DC

## 2020-10-23 MED ORDER — ONDANSETRON HCL 4 MG/2ML IJ SOLN: 4.0000 mg | Freq: Four times a day (QID) | INTRAMUSCULAR | Status: DC | PRN

## 2020-10-23 MED ORDER — OXYCODONE HCL 5 MG PO TABS: 5.0000 mg | ORAL_TABLET | ORAL | Status: DC | PRN

## 2020-10-23 MED ORDER — EVOLOCUMAB 140 MG/ML ~~LOC~~ SOAJ: 140.0000 mg | SUBCUTANEOUS | Status: DC

## 2020-10-23 MED ORDER — PREDNISONE 20 MG PO TABS: 40.0000 mg | ORAL_TABLET | Freq: Every day | ORAL | Status: DC

## 2020-10-23 MED ORDER — DM-GUAIFENESIN ER 30-600 MG PO TB12
1.0000 | ORAL_TABLET | Freq: Two times a day (BID) | ORAL | Status: DC
  Administered 2020-10-23: 1 via ORAL
  Filled 2020-10-23: qty 1

## 2020-10-23 MED ORDER — ASPIRIN EC 81 MG PO TBEC
81.0000 mg | DELAYED_RELEASE_TABLET | Freq: Every day | ORAL | Status: DC
  Administered 2020-10-23: 81 mg via ORAL
  Filled 2020-10-23: qty 1

## 2020-10-23 MED ORDER — NITROGLYCERIN 0.4 MG SL SUBL: 0.4000 mg | SUBLINGUAL_TABLET | SUBLINGUAL | Status: DC | PRN

## 2020-10-23 MED ORDER — METFORMIN HCL ER 500 MG PO TB24
500.0000 mg | ORAL_TABLET | Freq: Every day | ORAL | Status: DC
  Filled 2020-10-23: qty 1

## 2020-10-23 MED ORDER — PANTOPRAZOLE SODIUM 40 MG PO TBEC
40.0000 mg | DELAYED_RELEASE_TABLET | Freq: Every day | ORAL | Status: DC
  Administered 2020-10-23: 40 mg via ORAL
  Filled 2020-10-23: qty 1

## 2020-10-23 MED ORDER — MAGNESIUM HYDROXIDE 400 MG/5ML PO SUSP: 30.0000 mL | Freq: Every day | ORAL | Status: DC | PRN

## 2020-10-23 MED ORDER — IPRATROPIUM-ALBUTEROL 0.5-2.5 (3) MG/3ML IN SOLN
3.0000 mL | Freq: Four times a day (QID) | RESPIRATORY_TRACT | Status: DC
  Administered 2020-10-23: 3 mL via RESPIRATORY_TRACT
  Filled 2020-10-23: qty 3

## 2020-10-23 MED ORDER — ACETAMINOPHEN 650 MG RE SUPP: 650.0000 mg | Freq: Four times a day (QID) | RECTAL | Status: DC | PRN

## 2020-10-23 MED ORDER — POTASSIUM CHLORIDE 20 MEQ PO PACK
40.0000 meq | PACK | Freq: Once | ORAL | Status: AC
  Administered 2020-10-23: 40 meq via ORAL
  Filled 2020-10-23: qty 2

## 2020-10-23 MED ORDER — FLUTICASONE PROPIONATE 50 MCG/ACT NA SUSP
2.0000 | Freq: Two times a day (BID) | NASAL | Status: DC
  Administered 2020-10-23: 2 via NASAL
  Filled 2020-10-23 (×2): qty 16

## 2020-10-23 MED ORDER — OXYCODONE-ACETAMINOPHEN 10-325 MG PO TABS: 1.0000 | ORAL_TABLET | ORAL | Status: DC | PRN

## 2020-10-23 MED ORDER — PERFLUTREN LIPID MICROSPHERE
1.0000 mL | INTRAVENOUS | Status: AC | PRN
  Administered 2020-10-23: 3 mL via INTRAVENOUS
  Filled 2020-10-23: qty 10

## 2020-10-23 MED ORDER — OSELTAMIVIR PHOSPHATE 75 MG PO CAPS
75.0000 mg | ORAL_CAPSULE | Freq: Two times a day (BID) | ORAL | Status: DC
  Administered 2020-10-23: 75 mg via ORAL
  Filled 2020-10-23 (×2): qty 1

## 2020-10-23 MED ORDER — GUAIFENESIN ER 600 MG PO TB12
600.0000 mg | ORAL_TABLET | Freq: Two times a day (BID) | ORAL | Status: DC
  Administered 2020-10-23: 600 mg via ORAL
  Filled 2020-10-23: qty 1

## 2020-10-23 MED ORDER — FUROSEMIDE 10 MG/ML IJ SOLN
40.0000 mg | Freq: Two times a day (BID) | INTRAMUSCULAR | Status: DC
  Filled 2020-10-23: qty 4

## 2020-10-23 MED ORDER — OXYCODONE-ACETAMINOPHEN 5-325 MG PO TABS
1.0000 | ORAL_TABLET | Freq: Once | ORAL | Status: AC
Start: 2020-10-23 — End: 2020-10-23
  Administered 2020-10-23: 1 via ORAL
  Filled 2020-10-23: qty 1

## 2020-10-23 MED ORDER — ALBUTEROL SULFATE (2.5 MG/3ML) 0.083% IN NEBU
10.0000 mg | INHALATION_SOLUTION | Freq: Once | RESPIRATORY_TRACT | Status: AC
  Administered 2020-10-23: 10 mg via RESPIRATORY_TRACT
  Filled 2020-10-23: qty 12

## 2020-10-23 MED ORDER — MELATONIN 3 MG PO TABS
3.0000 mg | ORAL_TABLET | Freq: Every day | ORAL | Status: DC
  Filled 2020-10-23: qty 1

## 2020-10-23 MED ORDER — FUROSEMIDE 10 MG/ML IJ SOLN
40.0000 mg | Freq: Once | INTRAMUSCULAR | Status: AC
  Administered 2020-10-23: 40 mg via INTRAVENOUS
  Filled 2020-10-23: qty 4

## 2020-10-23 MED ORDER — ISOSORBIDE MONONITRATE ER 60 MG PO TB24
60.0000 mg | ORAL_TABLET | Freq: Every day | ORAL | Status: DC
  Administered 2020-10-23: 60 mg via ORAL
  Filled 2020-10-23: qty 1

## 2020-10-23 MED ORDER — CITALOPRAM HYDROBROMIDE 20 MG PO TABS
10.0000 mg | ORAL_TABLET | Freq: Every day | ORAL | Status: DC
  Administered 2020-10-23: 10 mg via ORAL
  Filled 2020-10-23: qty 1

## 2020-10-23 MED ORDER — FUROSEMIDE 40 MG PO TABS: 40.0000 mg | ORAL_TABLET | Freq: Every morning | ORAL | Status: DC

## 2020-10-23 MED ORDER — METHYLPREDNISOLONE SODIUM SUCC 40 MG IJ SOLR
40.0000 mg | Freq: Four times a day (QID) | INTRAMUSCULAR | Status: DC
Start: 2020-10-23 — End: 2020-10-23
  Administered 2020-10-23: 40 mg via INTRAVENOUS
  Filled 2020-10-23: qty 1

## 2020-10-23 MED ORDER — SODIUM CHLORIDE 0.9 % IV SOLN
500.0000 mg | INTRAVENOUS | Status: DC
  Administered 2020-10-23: 500 mg via INTRAVENOUS
  Filled 2020-10-23 (×2): qty 500

## 2020-10-23 MED ORDER — ACETAMINOPHEN 325 MG PO TABS: 650.0000 mg | ORAL_TABLET | Freq: Four times a day (QID) | ORAL | Status: DC | PRN

## 2020-10-23 NOTE — ED Notes (Signed)
Pt uses guest phone to call ER number to state that he is leaving since he's being "forgotten about." This RN and Product manager went into pt room and asked why pt wanted to leave. Pt stated "I've been put in this room and forgotten about, I want to leave and go to [facility] in the morning." Pt made aware that he can find a ride and leave AMA.  This RN and Product manager prepared AMA paperwork. Once returned to pt room, pt asked how he could get a ride home. Pt made aware if he leaves AMA we are not responsible for finding himself a ride. This RN and Product manager once again made pt that he is still being assessed. Pt agreed to be treated.

## 2020-10-23 NOTE — ED Notes (Signed)
This RN to bedside, introduced self to patient. 20 to R hand initiated by this RN. Rocephin moved from [facility] to hand to flow, pt placed back on cardiac monitor. Pt requesting pain medication at this time.

## 2020-10-23 NOTE — H&P (Signed)
[facility]   PATIENT NAME: [name]    MR#:  [id number]  DATE OF BIRTH:  [birth year]  DATE OF ADMISSION:  10/22/2020  PRIMARY CARE PHYSICIAN: Bubba Camp, MD   Patient is coming from: Home  REQUESTING/REFERRING PHYSICIAN: Nita Sickle, MD CHIEF COMPLAINT:   Chief Complaint  Patient presents with  . Shortness of Breath    HISTORY OF PRESENT ILLNESS:  [name] FOTI Sr. is a 65 y.o. Caucasian male with medical history significant for COPD, coronary artery disease, dyslipidemia, anxiety, chronic back pain, hypertension and recent admission for COPD exacerbation and influenza A on 2/27 here and discharged yesterday.  He presents in emergency room with acute onset of worsening dyspnea as well as syncope after excessive cough while having dinner with his family.  He was satting initially at 96 to 98% on room air on route to the hospital but later on dropped his sats to 89% on room air.  He was having dyspnea as well as wheezing with his cough.  He apparently been feeling ready to be discharged yesterday per his report.  No fever or chills.  No chest pain or palpitations.  No nausea or vomiting or abdominal pain.  No dysuria, oliguria or hematuria or flank pain. ED Course: When he came to the ER, respiratory rate was 24 and blood pressure was 102/91 with otherwise normal vital signs. Later on respiratory rate was up to 33.   Pulse oximetry as mentioned above dropped to 89% on room air and after bronchodilator therapy mid to high 90s on 2 L of O2 by nasal cannula.   Labs revealed venous blood gas with pH 7.38 and HCO3 of 24.8.  CMP revealed blood glucose 174 with a creatinine of 1.32 and a BUN of 27.  Creatinine is up from 1.13 yesterday.  AST was 44 with otherwise unremarkable LFTs.  BNP was 339.3 and high-sensitivity troponin I was 7 and later the same.  CBC showed leukocytosis of 14.5 up from 13 yesterday with mild anemia.  Influenza antigen is still positive influenza B was  negative COVID-19 PCR came back negative.  Alcohol level was 79 and urine drug screen was positive for opiates. EKG as reviewed by me :  Showed sinus rhythm with rate 95 with short PR interval and low voltage QRS with borderline prolonged QT interval with QTC of 492 MS Imaging: Chest x-ray showed cardiomegaly with vascular congestion.  The patient was given 40 mg of IV Lasix, 125 mg IV Solu-Medrol, 1 p.o. Percocet, 5 mL p.o. Tussionex and 3 duo nebs as well as 10 mL of continuous albuterol nebulizer.  He will be admitted to an observation medical monitored bed for further evaluation and management. PAST MEDICAL HISTORY:   Past Medical History:  Diagnosis Date  . Anxiety disorder   . Arthritis   . CAD (coronary artery disease)    a. ~ 2000 cath at [facility];  b. 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  . CHF (congestive heart failure) (HCC)   . Chronic back pain   . Chronic hip pain   . COPD (chronic obstructive pulmonary disease) (HCC)   . Depression   . Essential hypertension, benign   . GERD (gastroesophageal reflux disease)   . Headache(784.0)   . History of alcohol abuse   .  History of cocaine abuse (HCC)   . Hyperlipidemia   . Obesity   . Pneumonia     PAST SURGICAL HISTORY:   Past Surgical History:  Procedure Laterality Date  . BACK SURGERY    . CHOLECYSTECTOMY    . CORONARY ARTERY BYPASS GRAFT    . HIP SURGERY     rt  . JOINT REPLACEMENT    . LEFT HEART CATHETERIZATION WITH CORONARY ANGIOGRAM N/A 09/12/2013   Procedure: LEFT HEART CATHETERIZATION WITH CORONARY ANGIOGRAM;  Surgeon: Kathleene Hazel, MD;  Location: [facility] CATH LAB;  Service: Cardiovascular;  Laterality: N/A;  . NASAL/SINUS ENDOSCOPY    . TOTAL HIP ARTHROPLASTY  05/08/2012   Procedure: TOTAL HIP ARTHROPLASTY;  Surgeon: Nestor Lewandowsky, MD;  Location: [facility] OR;  Service:  Orthopedics;  Laterality: Left;  . TOTAL HIP ARTHROPLASTY  2014 per pt    SOCIAL HISTORY:   Social History   Tobacco Use  . Smoking status: Former Smoker    Packs/day: 0.50    Years: 39.00    Pack years: 19.50    Types: Cigarettes    Quit date: 09/15/2015    Years since quitting: 5.1  . Smokeless tobacco: Never Used  Substance Use Topics  . Alcohol use: Yes    Alcohol/week: 0.0 standard drinks    FAMILY HISTORY:   Family History  Problem Relation Age of Onset  . Heart attack Mother   . Heart attack Brother   . Heart attack Sister   . Heart attack Father     DRUG ALLERGIES:   Allergies  Allergen Reactions  . Fluticasone Furoate-Vilanterol Swelling    Tongue Swelling  . Tiotropium Swelling    Tongue Swelling   . Ativan [Lorazepam] Other (See Comments)    Altered Mental Status   . Haloperidol Decanoate Other (See Comments)    Altered Mental Status   . Atorvastatin Other (See Comments)    Pain to joints  . Sulfamethoxazole-Trimethoprim Itching    REVIEW OF SYSTEMS:   ROS As per history of present illness. All pertinent systems were reviewed above. Constitutional, HEENT, cardiovascular, respiratory, GI, GU, musculoskeletal, neuro, psychiatric, endocrine, integumentary and hematologic systems were reviewed and are otherwise negative/unremarkable except for positive findings mentioned above in the HPI.   MEDICATIONS AT HOME:   Prior to Admission medications   Medication Sig Start Date End Date Taking? Authorizing Provider  albuterol (PROVENTIL) (2.5 MG/3ML) 0.083% nebulizer solution Take 3 mLs (2.5 mg total) by nebulization every 6 (six) hours as needed for wheezing or shortness of breath. 10/22/20   Lurene Shadow, MD  aspirin 81 MG tablet Take 1 tablet (81 mg total) by mouth daily. 05/31/12   Kathlen Mody, MD  budesonide-formoterol (SYMBICORT) 160-4.5 MCG/ACT inhaler Inhale 2 puffs into the lungs 2 (two) times daily.    [provider]  buPROPion  (WELLBUTRIN XL) 150 MG 24 hr tablet Take 300 mg by mouth daily.    [provider]  citalopram (CELEXA) 10 MG tablet Take 10 mg by mouth daily. 02/29/20   [provider]  [facility] MELATONIN 3 MG TABS Take 1 tablet by mouth at bedtime.  03/22/18   [provider]  fluticasone (FLONASE) 50 MCG/ACT nasal spray Place 2 sprays into both nostrils 2 (two) times daily. 12/04/19   [provider]  furosemide (LASIX) 20 MG tablet Take 40 mg by mouth every morning.     [provider]  HYDROcodone-homatropine (HYCODAN) 5-1.5 MG/5ML syrup Take 5 mLs by mouth every  6 (six) hours as needed for cough. 10/22/20   Lurene Shadow, MD  hydrOXYzine (ATARAX/VISTARIL) 25 MG tablet Take 50 mg by mouth at bedtime. 02/10/20   [provider]  isosorbide mononitrate (IMDUR) 60 MG 24 hr tablet Take 60 mg by mouth daily. 06/22/18   [provider]  metFORMIN (GLUCOPHAGE-XR) 500 MG 24 hr tablet Take 500 mg by mouth daily before supper. 09/26/20   [provider]  mirtazapine (REMERON) 15 MG tablet Take 15 mg by mouth at bedtime. 12/21/19   [provider]  nitroGLYCERIN (NITROSTAT) 0.4 MG SL tablet Place 0.4 mg under the tongue every 5 (five) minutes as needed. For chest pain    [provider]  omeprazole (PRILOSEC) 20 MG capsule Take 20 mg by mouth every morning.     [provider]  oxyCODONE-acetaminophen (PERCOCET) 10-325 MG tablet Take 1 tablet by mouth every 4 (four) hours as needed for pain.    [provider]  predniSONE (DELTASONE) 20 MG tablet Take 2 tablets (40 mg total) by mouth daily for 3 days. 10/22/20 10/25/20  Lurene Shadow, MD  PROAIR HFA 108 (90 BASE) MCG/ACT inhaler Inhale 2 puffs into the lungs every 4 (four) hours as needed for wheezing or shortness of breath.    [provider]  REPATHA SURECLICK 140 MG/ML SOAJ Inject 140 mg into the skin every 14 (fourteen) days.    [provider]  triamcinolone  ointment (KENALOG) 0.1 % Apply 1 application topically 2 (two) times daily. 09/25/20   [provider]      VITAL SIGNS:  Blood pressure 116/61, pulse 98, temperature 97.6 F (36.4 C), temperature source Oral, resp. rate (!) 27, SpO2 93 %.  PHYSICAL EXAMINATION:  Physical Exam  GENERAL:  65 y.o.-year-old Caucasian male patient lying in the bed with mild respiratory distress with conversational dyspnea EYES: Pupils equal, round, reactive to light and accommodation. No scleral icterus. Extraocular muscles intact.  HEENT: Head atraumatic, normocephalic. Oropharynx and nasopharynx clear.  NECK:  Supple, no jugular venous distention. No thyroid enlargement, no tenderness.  LUNGS: Slight diminished basal breath sounds with bibasal rales and diffuse expiratory wheezes with diminished expiratory airflow and harsh vesicular breathing.  CARDIOVASCULAR: Regular rate and rhythm, S1, S2 normal. No murmurs, rubs, or gallops.  ABDOMEN: Soft, nondistended, nontender. Bowel sounds present. No organomegaly or mass.  EXTREMITIES: No pedal edema, cyanosis, or clubbing.  NEUROLOGIC: Cranial nerves II through XII are intact. Muscle strength 5/5 in all extremities. Sensation intact. Gait not checked.  PSYCHIATRIC: The patient is alert and oriented x 3.  Normal affect and good eye contact. SKIN: No obvious rash, lesion, or ulcer.   LABORATORY PANEL:   CBC Recent Labs  Lab 10/23/20 0006  WBC 14.5*  HGB 12.5*  HCT 37.6*  PLT 255   ------------------------------------------------------------------------------------------------------------------  Chemistries  Recent Labs  Lab 10/23/20 0006  NA 137  K 3.6  CL 105  CO2 21*  GLUCOSE 174*  BUN 27*  CREATININE 1.32*  CALCIUM 8.5*  AST 44*  ALT 43  ALKPHOS 50  BILITOT 0.4   ------------------------------------------------------------------------------------------------------------------  Cardiac Enzymes No results for input(s): TROPONINI  in the last 168 hours. ------------------------------------------------------------------------------------------------------------------  RADIOLOGY:  DG Chest Portable 1 View  Result Date: 10/23/2020 CLINICAL DATA:  Cough and shortness of breath EXAM: PORTABLE CHEST 1 VIEW COMPARISON:  10/19/2020 FINDINGS: There is cardiomegaly with vascular congestion. Aortic calcifications are noted. There is no pneumothorax. No large pleural effusion. Bibasilar atelectasis is noted. Evaluation is  limited by patient body habitus. IMPRESSION: Cardiomegaly with vascular congestion. Electronically Signed   By: Katherine Mantle M.D.   On: 10/23/2020 00:25      IMPRESSION AND PLAN:  Active Problems:   COPD exacerbation (HCC)  1.  Mild acute on chronic diastolic CHF. -The patient will be admitted to a medical monitored bed. -We will diurese with IV Lasix. -We will follow serial troponin I's. -2D echo be obtained.  2.  COPD exacerbation. -We will continue IV steroid therapy as well as scheduled and as needed bronchodilator therapy with DuoNeb's. -Mucolytic therapy will be provided. -We will place the patient on IV Rocephin and Zithromax.  3.  Acute hypoxic respiratory failure secondary to #1 and #2. -O2 protocol will be followed.  4.  Recent influenza A likely contributing to #2. -Given persistent symptoms I will go ahead and place the patient on p.o. Tamiflu.  5.  Mild acute kidney injury likely prerenal secondary to acute CHF.. -We will monitor renal functions with diuresis.  6.  Type 2 diabetes mellitus. -We will place the patient on supplement coverage with NovoLog and hold off Metformin.  7.  GERD. -PPI therapy will be resumed.  8.  Depression. -We will continue butyrin XL and Lexapro.  DVT prophylaxis: Lovenox. Code Status: full code. Family Communication:  The plan of care was discussed in details with the patient (and family). I answered all questions. The patient agreed to proceed  with the above mentioned plan. Further management will depend upon hospital course. Disposition Plan: Back to previous home environment Consults called: none. All the records are reviewed and case discussed with ED provider.  Status is: Observation  The patient remains OBS appropriate and will d/c before 2 midnights.  Dispo: The patient is from: Home              Anticipated d/c is to: Home              Patient currently is not medically stable to d/c.   Difficult to place patient No   TOTAL TIME TAKING CARE OF THIS PATIENT: 55 minutes.    Hannah Beat M.D on 10/23/2020 at 4:21 AM  [facility]   From 7 PM-7 AM, contact night-coverage [email or web address]  CC: Primary care physician; Bubba Camp, MD

## 2020-10-23 NOTE — ED Notes (Signed)
At initial assessment, pt very loud and stating he needed an [place] IV and that anything else would blow. Kory RN and Viviann Spare RN made pt aware that they were still going to try to look for an IV without [place]. Pt continued to request [place] IV only and Tula Nakayama stated he would try to get someone to assist with [place]. Pt started to be verbal loud stated "Oh, so all of you are just trainees then?" RNs reassured that this was false and his remark was uncalled for.   ED provider notified of pt behavior and made pt aware that it is inappropriate to address hospital staff this way.

## 2020-10-23 NOTE — Progress Notes (Signed)
*  PRELIMINARY RESULTS* Echocardiogram 2D Echocardiogram has been performed.  [name] [name] 10/23/2020, 9:53 AM

## 2020-10-23 NOTE — Progress Notes (Addendum)
Pt left against Medical Advice despite being educated otherwise. Dr. Fran Lowes made aware and ordered to give patient the [facility] paperwork. The patient signed document. x2 R arm PIVs removed. Both cannulas intact. Pt tolerated well. Pt's son picked up patient from hospital for departure.

## 2020-10-23 NOTE — ED Provider Notes (Signed)
[facility] Emergency Department Provider Note  ____________________________________________  Time seen: Approximately 12:37 AM  I have reviewed the triage vital signs and the nursing notes.   HISTORY  Chief Complaint Shortness of Breath   HPI [name]. is a 65 y.o. male with a history of CAD, COPD, chronic pain, CHF (Ef 50-55%), alcohol abuse, cocaine abuse, HTN, HLD who presents for evaluation of SOB and syncope.  Patient was discharged from the hospital yesterday after being admitted for 3 days for respiratory failure, COPD exacerbation in the setting of influenza A.  Patient reports that he felt very weak but was discharged anyways.  This evening he was having dinner with his family when he had a coughing fit and passed out.  Patient regained consciousness on an ambulance.  Patient satting 96 to 98% on room air but frequent coughing spells.  Continues to complain of shortness of breath and wheezing.  No fevers, no chest pain.  Patient reports that he has not felt improved since being admitted to the hospital and knew it was the wrong thing to be discharged yesterday.  Past Medical History:  Diagnosis Date  . Anxiety disorder   . Arthritis   . CAD (coronary artery disease)    a. ~ 2000 cath at [facility];  b. 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  . CHF (congestive heart failure) (HCC)   . Chronic back pain   . Chronic hip pain   . COPD (chronic obstructive pulmonary disease) (HCC)   . Depression   . Essential hypertension, benign   . GERD (gastroesophageal reflux disease)   . Headache(784.0)   . History of alcohol abuse   . History of cocaine abuse (HCC)   . Hyperlipidemia   . Obesity   . Pneumonia     Patient Active Problem List   Diagnosis Date Noted  . COPD exacerbation (HCC) 10/19/2020  .  Influenza A 10/19/2020  . Respiratory failure (HCC) 10/22/2018  . COPD with acute exacerbation (HCC) 09/08/2018  . Hyperthyroidism 07/26/2018  . NSTEMI (non-ST elevated myocardial infarction) (HCC) 07/11/2015  . Chest pain 04/30/2015  . Syncope 04/30/2015  . Orthostatic syncope 04/30/2015  . Chronic back pain   . (HFpEF) heart failure with preserved ejection fraction (HCC)   . History of cocaine abuse (HCC)   . History of alcohol abuse   . Prediabetes 08/12/2014  . Tobacco abuse 09/21/2013  . Sleep apnea- C-pap intol 09/14/2013  . Coronary atherosclerosis of native coronary artery   . OBESITY, MORBID 05/29/2008  . Dyslipidemia 01/19/2007  . ANXIETY DISORDER, GENERALIZED 01/19/2007  . Depression 10/20/2006  . Essential hypertension, benign 10/20/2006  . COPD (chronic obstructive pulmonary disease) (HCC) 10/20/2006    Past Surgical History:  Procedure Laterality Date  . BACK SURGERY    . CHOLECYSTECTOMY    . CORONARY ARTERY BYPASS GRAFT    . HIP SURGERY     rt  . JOINT REPLACEMENT    . LEFT HEART CATHETERIZATION WITH CORONARY ANGIOGRAM N/A 09/12/2013   Procedure: LEFT HEART CATHETERIZATION WITH CORONARY ANGIOGRAM;  Surgeon: Kathleene Hazel, MD;  Location: [facility] CATH LAB;  Service: Cardiovascular;  Laterality: N/A;  . NASAL/SINUS ENDOSCOPY    . TOTAL HIP ARTHROPLASTY  05/08/2012   Procedure: TOTAL HIP ARTHROPLASTY;  Surgeon: Nestor Lewandowsky, MD;  Location: [facility] OR;  Service: Orthopedics;  Laterality: Left;  . TOTAL HIP ARTHROPLASTY  2014 per pt    Prior to Admission medications   Medication Sig Start Date End Date Taking? Authorizing Provider  albuterol (PROVENTIL) (2.5 MG/3ML) 0.083% nebulizer solution Take 3 mLs (2.5 mg total) by nebulization every 6 (six) hours as needed for wheezing or shortness of breath. 10/22/20   Lurene Shadow, MD  aspirin 81 MG tablet Take 1 tablet (81 mg total) by mouth daily. 05/31/12   Kathlen Mody, MD  budesonide-formoterol (SYMBICORT) 160-4.5  MCG/ACT inhaler Inhale 2 puffs into the lungs 2 (two) times daily.    [provider]  buPROPion (WELLBUTRIN XL) 150 MG 24 hr tablet Take 300 mg by mouth daily.    [provider]  citalopram (CELEXA) 10 MG tablet Take 10 mg by mouth daily. 02/29/20   [provider]  [employer] MELATONIN 3 MG TABS Take 1 tablet by mouth at bedtime.  03/22/18   [provider]  fluticasone (FLONASE) 50 MCG/ACT nasal spray Place 2 sprays into both nostrils 2 (two) times daily. 12/04/19   [provider]  furosemide (LASIX) 20 MG tablet Take 40 mg by mouth every morning.     [provider]  HYDROcodone-homatropine (HYCODAN) 5-1.5 MG/5ML syrup Take 5 mLs by mouth every 6 (six) hours as needed for cough. 10/22/20   Lurene Shadow, MD  hydrOXYzine (ATARAX/VISTARIL) 25 MG tablet Take 50 mg by mouth at bedtime. 02/10/20   [provider]  isosorbide mononitrate (IMDUR) 60 MG 24 hr tablet Take 60 mg by mouth daily. 06/22/18   [provider]  metFORMIN (GLUCOPHAGE-XR) 500 MG 24 hr tablet Take 500 mg by mouth daily before supper. 09/26/20   [provider]  mirtazapine (REMERON) 15 MG tablet Take 15 mg by mouth at bedtime. 12/21/19   [provider]  nitroGLYCERIN (NITROSTAT) 0.4 MG SL tablet Place 0.4 mg under the tongue every 5 (five) minutes as needed. For chest pain    [provider]  omeprazole (PRILOSEC) 20 MG capsule Take 20 mg by mouth every morning.     [provider]  oxyCODONE-acetaminophen (PERCOCET) 10-325 MG tablet Take 1 tablet by mouth every 4 (four) hours as needed for pain.    [provider]  predniSONE (DELTASONE) 20 MG tablet Take 2 tablets (40 mg total) by mouth daily for 3 days. 10/22/20 10/25/20  Lurene Shadow, MD  PROAIR HFA 108 (90 BASE) MCG/ACT inhaler Inhale 2 puffs into the lungs every 4 (four) hours as needed for wheezing or shortness of breath.    [provider]  REPATHA SURECLICK 140  MG/ML SOAJ Inject 140 mg into the skin every 14 (fourteen) days.    [provider]  triamcinolone ointment (KENALOG) 0.1 % Apply 1 application topically 2 (two) times daily. 09/25/20   [provider]    Allergies Fluticasone furoate-vilanterol, Tiotropium, Ativan [lorazepam], Haloperidol decanoate, Atorvastatin, and Sulfamethoxazole-trimethoprim  Family History  Problem Relation Age of Onset  . Heart attack Mother   . Heart attack Brother   . Heart attack Sister   . Heart attack Father     Social History Social History   Tobacco Use  . Smoking status: Former Smoker    Packs/day: 0.50    Years: 39.00    Pack years: 19.50    Types: Cigarettes    Quit date: 09/15/2015    Years since quitting: 5.1  . Smokeless tobacco: Never Used  Vaping Use  .  Vaping Use: Never used  Substance Use Topics  . Alcohol use: Yes    Alcohol/week: 0.0 standard drinks  . Drug use: No    Review of Systems  Constitutional: Negative for fever. + syncope Eyes: Negative for visual changes. ENT: Negative for sore throat. Neck: No neck pain  Cardiovascular: Negative for chest pain. Respiratory: + shortness of breath, wheezing, cough Gastrointestinal: Negative for abdominal pain, vomiting or diarrhea. Genitourinary: Negative for dysuria. Musculoskeletal: Negative for back pain. Skin: Negative for rash. Neurological: Negative for headaches, weakness or numbness. Psych: No SI or HI  ____________________________________________   PHYSICAL EXAM:  VITAL SIGNS: Vitals:   10/23/20 0003 10/23/20 0300  BP: (!) 102/91 116/61  Pulse: 88 98  Resp: 18 (!) 27  Temp: 97.6 F (36.4 C)   SpO2: 97% 93%     Constitutional: Alert and oriented, mild respiratory distress, actively coughing.  HEENT:      Head: Normocephalic and atraumatic.         Eyes: Conjunctivae are normal. Sclera is non-icteric.       Mouth/Throat: Mucous membranes are moist.       Neck: Supple with no signs of  meningismus. Cardiovascular: Regular rate and rhythm. No murmurs, gallops, or rubs. 2+ symmetrical distal pulses are present in all extremities. No JVD. Respiratory: Increased work of breathing with audible wheezing bilaterally, normal sats gastrointestinal: Soft, non tender, and non distended with positive bowel sounds. No rebound or guarding. Genitourinary: No CVA tenderness. Musculoskeletal:  No edema, cyanosis, or erythema of extremities. Neurologic: Normal speech and language. Face is symmetric. Moving all extremities. No gross focal neurologic deficits are appreciated. Skin: Skin is warm, dry and intact. No rash noted. Psychiatric: Mood and affect are normal. Speech and behavior are normal.  ____________________________________________   LABS (all labs ordered are listed, but only abnormal results are displayed)  Labs Reviewed  RESP PANEL BY RT-PCR (FLU A&B, COVID) ARPGX2 - Abnormal; Notable for the following components:      Result Value   Influenza A by PCR POSITIVE (*)    All other components within normal limits  BLOOD GAS, VENOUS - Abnormal; Notable for the following components:   pCO2, Ven 42 (*)    pO2, Ven 79.0 (*)    All other components within normal limits  CBC WITH DIFFERENTIAL/PLATELET - Abnormal; Notable for the following components:   WBC 14.5 (*)    RBC 3.92 (*)    Hemoglobin 12.5 (*)    HCT 37.6 (*)    Neutro Abs 11.1 (*)    Monocytes Absolute 1.3 (*)    Abs Immature Granulocytes 0.28 (*)    All other components within normal limits  COMPREHENSIVE METABOLIC PANEL - Abnormal; Notable for the following components:   CO2 21 (*)    Glucose, Bld 174 (*)    BUN 27 (*)    Creatinine, Ser 1.32 (*)    Calcium 8.5 (*)    AST 44 (*)    All other components within normal limits  BRAIN NATRIURETIC PEPTIDE - Abnormal; Notable for the following components:   B Natriuretic Peptide 339.3 (*)    All other components within normal limits  URINE DRUG SCREEN, QUALITATIVE  ([facility] ONLY) - Abnormal; Notable for the following components:   Opiate, Ur Screen POSITIVE (*)    All other components within normal limits  ETHANOL - Abnormal; Notable for the following components:   Alcohol, Ethyl (B) 79 (*)    All other components within normal limits  PROCALCITONIN  TROPONIN I (HIGH SENSITIVITY)  TROPONIN I (HIGH SENSITIVITY)   ____________________________________________  EKG  ED ECG REPORT I, Nita Sickle, the attending physician, personally viewed and interpreted this ECG.  Sinus rhythm, rate of 95, borderline prolonged QTC, no ST elevations or depressions. ____________________________________________  RADIOLOGY  I have personally reviewed the images performed during this visit and I agree with the Radiologist's read.   Interpretation by Radiologist:  DG Chest Portable 1 View  Result Date: 10/23/2020 CLINICAL DATA:  Cough and shortness of breath EXAM: PORTABLE CHEST 1 VIEW COMPARISON:  10/19/2020 FINDINGS: There is cardiomegaly with vascular congestion. Aortic calcifications are noted. There is no pneumothorax. No large pleural effusion. Bibasilar atelectasis is noted. Evaluation is limited by patient body habitus. IMPRESSION: Cardiomegaly with vascular congestion. Electronically Signed   By: Katherine Mantle M.D.   On: 10/23/2020 00:25     ____________________________________________   PROCEDURES  Procedure(s) performed:yes .1-3 Lead EKG Interpretation Performed by: Nita Sickle, MD Authorized by: Nita Sickle, MD     Interpretation: non-specific     ECG rate assessment: normal     Rhythm: sinus rhythm     Ectopy: none     Critical Care performed: yes  CRITICAL CARE Performed by: Nita Sickle  ?  Total critical care time: 40 min  Critical care time was exclusive of separately billable procedures and treating other patients.  Critical care was necessary to treat or prevent imminent or life-threatening  deterioration.  Critical care was time spent personally by me on the following activities: development of treatment plan with patient and/or surrogate as well as nursing, discussions with consultants, evaluation of patient's response to treatment, examination of patient, obtaining history from patient or surrogate, ordering and performing treatments and interventions, ordering and review of laboratory studies, ordering and review of radiographic studies, pulse oximetry and re-evaluation of patient's condition.  ____________________________________________   INITIAL IMPRESSION / ASSESSMENT AND PLAN / ED COURSE  65 y.o. male with a history of CAD, COPD, chronic pain, CHF (Ef 50-55%), alcohol abuse, cocaine abuse, HTN, HLD who presents for evaluation of SOB and syncope.  Patient discharged from the hospital yesterday after being admitted for 3 days in acute hypoxic respiratory failure in the setting of COPD exacerbation influenza A.  Patient arrives in mild respiratory distress actively coughing pretty vigorously with wheezing but no hypoxia.  Ddx Influenza A, COPD exacerbation, PNA, Covid, pericarditis, myocarditis, CHF exacerbation.   Will get CXR, labs, procalcitonin, VBG, Covid swab. Will give duonebs, tussionex for cough, solumedrol. Patient laced on telemetry for close monitoring of cardiorespiratory status. Old medical records reviewed including records of recent admission.   _________________________ 3:24 AM on 10/23/2020 -----------------------------------------  Chest x-ray and BNP concerning for mild CHF exacerbation with no infiltrate.  Procalcitonin is normal.  Patient was given 40 mg of IV Lasix and 3 duo nebs.  Was reassessed and still wheezing pretty bad therefore he was placed on 1 hour of continuous albuterol.  I just reassessed patient was still wheezing and satting 89% on room air.  Placed on 2 L and will discuss with the hospitalist for admission.  VBG showing no signs of COPD  exacerbation.  Covid is negative.  Labs show leukocytosis but patient is on steroids.  At this time no indication to cover with antibiotics.    _____________________________________________ Please note:  Patient was evaluated in Emergency Department today for the symptoms described in the history of present illness. Patient was evaluated in the context of the global COVID-19  pandemic, which necessitated consideration that the patient might be at risk for infection with the SARS-CoV-2 virus that causes COVID-19. Institutional protocols and algorithms that pertain to the evaluation of patients at risk for COVID-19 are in a state of rapid change based on information released by regulatory bodies including the CDC and federal and state organizations. These policies and algorithms were followed during the patient's care in the ED.  Some ED evaluations and interventions may be delayed as a result of limited staffing during the pandemic.   [place] Controlled Substance Database was reviewed by me. ____________________________________________   FINAL CLINICAL IMPRESSION(S) / ED DIAGNOSES   Final diagnoses:  Acute respiratory failure with hypoxia (HCC)  Influenza A  Acute on chronic congestive heart failure, unspecified heart failure type (HCC)      NEW MEDICATIONS STARTED DURING THIS VISIT:  ED Discharge Orders    None       Note:  This document was prepared using Dragon voice recognition software and may include unintentional dictation errors.    Don Perking, [place], MD 10/23/20 [phone]

## 2020-10-23 NOTE — Progress Notes (Signed)
PHARMACIST - PHYSICIAN COMMUNICATION  CONCERNING:  Enoxaparin (Lovenox) for DVT Prophylaxis    RECOMMENDATION: Patient was prescribed enoxaprin 40mg  q24 hours for VTE prophylaxis.   There were no vitals filed for this visit.  There is no height or weight on file to calculate BMI.  Estimated Creatinine Clearance: 92.8 mL/min (A) (by C-G formula based on SCr of 1.35 mg/dL (H)).   Based on [facility] policy patient is candidate for enoxaparin 0.5mg /kg TBW SQ every 24 hours based on BMI being >30.  DESCRIPTION: Pharmacy has adjusted enoxaparin dose per [facility] policy.  Patient is now receiving enoxaparin 80 mg every 24 hours    [facility], PharmD Clinical Pharmacist  10/23/2020 7:10 AM

## 2020-10-24 LAB — CULTURE, BLOOD (SINGLE): Culture: NO GROWTH

## 2020-10-28 NOTE — Discharge Summary (Signed)
Physician Discharge Summary  BETTY BROOKS Sr. FTD:[id number] DOB: 1955-10-11 DOA: 10/19/2020  PCP: Bubba Camp, MD  Admit date: 10/19/2020 Discharge date: 10/28/2020  Recommendations for Outpatient Follow-up:  1. Patient left AMA.  Discharge Diagnoses: Principal diagnosis is #1 1. Mild acute on chronic diastolic CHF 2. COPD exacerbation 3. Acute hypoxic respiratory failure secondary to COPD exacerbation and acute on chronic diastolic CHF 4. Recent influenza A contributing to COPD exacerbation 5. Mild acute kidney injury likely prerenal secondary to acute CHF 6. DM II 7. GERD 8. Depression  Discharge Condition: Patient left AMA Disposition: Patient left AMA  Diet recommendation: Patient left AMA  Filed Weights   10/19/20 1716 10/20/20 0500 10/21/20 0402  Weight: (!) 158.8 kg (!) 159 kg (!) 162.4 kg    History of present illness:  KARSEN NAKANISHI Sr. is a 65 y.o. Caucasian male with medical history significant for COPD, coronary artery disease, dyslipidemia, anxiety, chronic back pain, hypertension and recent admission for COPD exacerbation and influenza A on 2/27 here and discharged yesterday.  He presents in emergency room with acute onset of worsening dyspnea as well as syncope after excessive cough while having dinner with his family.  He was satting initially at 96 to 98% on room air on route to the hospital but later on dropped his sats to 89% on room air.  He was having dyspnea as well as wheezing with his cough.  He apparently been feeling ready to be discharged yesterday per his report.  No fever or chills.  No chest pain or palpitations.  No nausea or vomiting or abdominal pain.  No dysuria, oliguria or hematuria or flank pain. ED Course: When he came to the ER, respiratory rate was 24 and blood pressure was 102/91 with otherwise normal vital signs. Later on respiratory rate was up to 33.   Pulse oximetry as mentioned above dropped to 89% on room air and after  bronchodilator therapy mid to high 90s on 2 L of O2 by nasal cannula.   Labs revealed venous blood gas with pH 7.38 and HCO3 of 24.8.  CMP revealed blood glucose 174 with a creatinine of 1.32 and a BUN of 27.  Creatinine is up from 1.13 yesterday.  AST was 44 with otherwise unremarkable LFTs.  BNP was 339.3 and high-sensitivity troponin I was 7 and later the same.  CBC showed leukocytosis of 14.5 up from 13 yesterday with mild anemia.  Influenza antigen is still positive influenza B was negative COVID-19 PCR came back negative.  Alcohol level was 79 and urine drug screen was positive for opiates. EKG as reviewed by me :  Showed sinus rhythm with rate 95 with short PR interval and low voltage QRS with borderline prolonged QT interval with QTC of 492 MS Imaging: Chest x-ray showed cardiomegaly with vascular congestion.  The patient was given 40 mg of IV Lasix, 125 mg IV Solu-Medrol, 1 p.o. Percocet, 5 mL p.o. Tussionex and 3 duo nebs as well as 10 mL of continuous albuterol nebulizer.  He will be admitted to an observation medical monitored bed for further evaluation and management.  Hospital Course:  The patient left AMA.  Today's assessment: S: The patient was awake, alert and oriented x 3. No acute distress. O: Vitals:  Vitals:   10/22/20 0730 10/22/20 0817  BP:  137/68  Pulse:  70  Resp:  19  Temp:  98 F (36.7 C)  SpO2: 95% 93%   Exam:  Constitutional:   The  patient is awake, alert, and oriented x 3. No acute distress. Respiratory:   No increased work of breathing.  No wheezes, rales, or rhonchi  No tactile fremitus Cardiovascular:   Regular rate and rhythm  No murmurs, ectopy, or gallups.  No lateral PMI. No thrills. Abdomen:   Abdomen is soft, non-tender, non-distended  No hernias, masses, or organomegaly  Normoactive bowel sounds.  Musculoskeletal:   No cyanosis, clubbing, or edema Skin:   No rashes, lesions, ulcers  palpation of skin: no induration or  nodules Neurologic:   CN 2-12 intact  Sensation all 4 extremities intact Psychiatric:   Mental status o Mood, affect appropriate o Orientation to person, place, time   judgment and insight appear intact  The results of significant diagnostics from this hospitalization (including imaging, microbiology, ancillary and laboratory) are listed below for reference.    Significant Diagnostic Studies: DG Chest Portable 1 View  Result Date: 10/23/2020 CLINICAL DATA:  Cough and shortness of breath EXAM: PORTABLE CHEST 1 VIEW COMPARISON:  10/19/2020 FINDINGS: There is cardiomegaly with vascular congestion. Aortic calcifications are noted. There is no pneumothorax. No large pleural effusion. Bibasilar atelectasis is noted. Evaluation is limited by patient body habitus. IMPRESSION: Cardiomegaly with vascular congestion. Electronically Signed   By: Katherine Mantle M.D.   On: 10/23/2020 00:25   DG Chest Port 1 View  Result Date: 10/19/2020 CLINICAL DATA:  Shortness of breath, cough. EXAM: PORTABLE CHEST 1 VIEW COMPARISON:  September 08, 2020. FINDINGS: Mild cardiomegaly is noted. No pneumothorax or pleural effusion is noted. Mild bibasilar subsegmental atelectasis or edema is noted. Bony thorax is unremarkable. IMPRESSION: Mild bibasilar subsegmental atelectasis or edema. Electronically Signed   By: Lupita Raider M.D.   On: 10/19/2020 18:10   ECHOCARDIOGRAM COMPLETE  Result Date: 10/23/2020    ECHOCARDIOGRAM REPORT   Patient Name:   MORTY SCHAAL Sr. Date of Exam: 10/23/2020 Medical Rec #:  [id number]         Height:       75.0 in Accession #:    [id number]        Weight:       358.0 lb Date of Birth:  18-May-1956         BSA:          2.810 m Patient Age:    64 years          BP:           159/74 mmHg Patient Gender: M                 HR:           86 bpm. Exam Location:  [facility] Procedure: 2D Echo, Color Doppler, Cardiac Doppler and Intracardiac            Opacification Agent Indications:     I50.31 CHF-Acute  Diastolic  History:         Patient has prior history of Echocardiogram examinations. CHF,                  CAD, Prior CABG, COPD; Risk Factors:Hypertension and                  Dyslipidemia.  Sonographer:     Humphrey Rolls RDCS (AE) Referring Phys:  [id number] Vernetta Honey MANSY Diagnosing Phys: Julien Nordmann MD  Sonographer Comments: Technically difficult study due to poor echo windows. Image acquisition challenging due to patient body habitus and Image acquisition challenging due to COPD. IMPRESSIONS  1. Left ventricular ejection fraction, by estimation, is 60 to 65%. The left ventricle has normal function. The left ventricle has no regional wall motion abnormalities. Left ventricular diastolic parameters are consistent with Grade I diastolic dysfunction (impaired relaxation).  2. Right ventricular systolic function is normal. The right ventricular size is normal. Tricuspid regurgitation signal is inadequate for assessing PA pressure.  3. The mitral valve is normal in structure. No evidence of mitral valve regurgitation. No evidence of mitral stenosis. FINDINGS  Left Ventricle: Left ventricular ejection fraction, by estimation, is 60 to 65%. The left ventricle has normal function. The left ventricle has no regional wall motion abnormalities. Definity contrast agent was given IV to delineate the left ventricular  endocardial borders. The left ventricular internal cavity size was normal in size. There is no left ventricular hypertrophy. Left ventricular diastolic parameters are consistent with Grade I diastolic dysfunction (impaired relaxation). Right Ventricle: The right ventricular size is normal. No increase in right ventricular wall thickness. Right ventricular systolic function is normal. Tricuspid regurgitation signal is inadequate for assessing PA pressure. Left Atrium: Left atrial size was normal in size. Right Atrium: Right atrial size was normal in size. Pericardium: There is no evidence of pericardial effusion.  Mitral Valve: The mitral valve is normal in structure. No evidence of mitral valve regurgitation. No evidence of mitral valve stenosis. MV peak gradient, 6.8 mmHg. The mean mitral valve gradient is 3.0 mmHg. Tricuspid Valve: The tricuspid valve is normal in structure. Tricuspid valve regurgitation is not demonstrated. No evidence of tricuspid stenosis. Aortic Valve: The aortic valve was not well visualized. Aortic valve regurgitation is not visualized. No aortic stenosis is present. Aortic valve mean gradient measures 6.0 mmHg. Aortic valve peak gradient measures 12.1 mmHg. Aortic valve area, by VTI measures 3.02 cm. Pulmonic Valve: The pulmonic valve was normal in structure. Pulmonic valve regurgitation is not visualized. No evidence of pulmonic stenosis. Aorta: The aortic root is normal in size and structure. Venous: The inferior vena cava is normal in size with greater than 50% respiratory variability, suggesting right atrial pressure of 3 mmHg. IAS/Shunts: No atrial level shunt detected by color flow Doppler.  LEFT VENTRICLE PLAX 2D LVIDd:         5.10 cm  Diastology LVIDs:         3.50 cm  LV e' lateral:   11.10 cm/s LV PW:         1.20 cm  LV E/e' lateral: 8.4 LV IVS:        1.10 cm LVOT diam:     2.50 cm LV SV:         98 LV SV Index:   35 LVOT Area:     4.91 cm  LEFT ATRIUM         Index LA diam:    3.80 cm 1.35 cm/m  AORTIC VALVE                    PULMONIC VALVE AV Area (Vmax):    2.88 cm     PV Vmax:       1.24 m/s AV Area (Vmean):   2.98 cm     PV Vmean:      81.100 cm/s AV Area (VTI):     3.02 cm     PV VTI:        0.225 m AV Vmax:           174.00 cm/s  PV Peak grad:  6.2 mmHg AV  Vmean:          120.000 cm/s PV Mean grad:  3.0 mmHg AV VTI:            0.323 m AV Peak Grad:      12.1 mmHg AV Mean Grad:      6.0 mmHg LVOT Vmax:         102.00 cm/s LVOT Vmean:        72.900 cm/s LVOT VTI:          0.199 m LVOT/AV VTI ratio: 0.62  AORTA Ao Root diam: 3.20 cm MITRAL VALVE MV Area (PHT): 3.17 cm      SHUNTS MV Area VTI:   3.40 cm     Systemic VTI:  0.20 m MV Peak grad:  6.8 mmHg     Systemic Diam: 2.50 cm MV Mean grad:  3.0 mmHg MV Vmax:       1.30 m/s MV Vmean:      80.3 cm/s MV Decel Time: 239 msec MV E velocity: 93.00 cm/s MV A velocity: 108.00 cm/s MV E/A ratio:  0.86 Julien Nordmann MD Electronically signed by Julien Nordmann MD Signature Date/Time: 10/23/2020/8:38:12 PM    Final     Microbiology: Recent Results (from the past 240 hour(s))  Urine culture     Status: None   Collection Time: 10/19/20  5:34 PM   Specimen: In/Out Cath Urine  Result Value Ref Range Status   Specimen Description   Final    IN/OUT CATH URINE Performed at [facility], [address], [place], [place] [address]    Special Requests   Final    NONE Performed at [facility], [address] [place], [place] [address]    Culture   Final    NO GROWTH Performed at [facility], [address], [place], [place] [address]    Report Status 10/21/2020 FINAL  Final  Resp Panel by RT-PCR (Flu A&B, Covid) Nasopharyngeal Swab     Status: Abnormal   Collection Time: 10/19/20  5:35 PM   Specimen: Nasopharyngeal Swab; Nasopharyngeal(NP) swabs in vial transport medium  Result Value Ref Range Status   SARS Coronavirus 2 by RT PCR NEGATIVE NEGATIVE Final    Comment: (NOTE) SARS-CoV-2 target nucleic acids are NOT DETECTED.  The SARS-CoV-2 RNA is generally detectable in upper respiratory specimens during the acute phase of infection. The lowest concentration of SARS-CoV-2 viral copies this assay can detect is 138 copies/mL. A negative result does not preclude SARS-Cov-2 infection and should not be used as the sole basis for treatment or other patient management decisions. A negative result may occur with  improper specimen collection/handling, submission of specimen other than nasopharyngeal swab, presence of viral mutation(s) within the areas targeted by this assay, and inadequate number of  viral copies(<138 copies/mL). A negative result must be combined with clinical observations, patient history, and epidemiological information. The expected result is Negative.  Fact Sheet for Patients:  BloggerCourse.com  Fact Sheet for Healthcare Providers:  SeriousBroker.it  This test is no t yet approved or cleared by the [place] FDA and  has been authorized for detection and/or diagnosis of SARS-CoV-2 by FDA under an Emergency Use Authorization (EUA). This EUA will remain  in effect (meaning this test can be used) for the duration of the COVID-19 declaration under Section 564(b)(1) of the Act, 21 U.S.C.section 360bbb-3(b)(1), unless the authorization is terminated  or revoked sooner.       Influenza A by PCR  POSITIVE (A) NEGATIVE Final   Influenza B by PCR NEGATIVE NEGATIVE Final    Comment: (NOTE) The Xpert Xpress SARS-CoV-2/FLU/RSV plus assay is intended as an aid in the diagnosis of influenza from Nasopharyngeal swab specimens and should not be used as a sole basis for treatment. Nasal washings and aspirates are unacceptable for Xpert Xpress SARS-CoV-2/FLU/RSV testing.  Fact Sheet for Patients: BloggerCourse.comhttps://www.fda.gov/media/152166/download  Fact Sheet for Healthcare Providers: SeriousBroker.ithttps://www.fda.gov/media/152162/download  This test is not yet approved or cleared by the [place] FDA and has been authorized for detection and/or diagnosis of SARS-CoV-2 by FDA under an Emergency Use Authorization (EUA). This EUA will remain in effect (meaning this test can be used) for the duration of the COVID-19 declaration under Section 564(b)(1) of the Act, 21 U.S.C. section 360bbb-3(b)(1), unless the authorization is terminated or revoked.  Performed at [facility], [address] [place], [place] [address]   Blood culture (routine single)     Status: None   Collection Time: 10/19/20  6:57 PM   Specimen: BLOOD   Result Value Ref Range Status   Specimen Description BLOOD BLOOD RIGHT HAND  Final   Special Requests   Final    BOTTLES DRAWN AEROBIC AND ANAEROBIC Blood Culture results may not be optimal due to an inadequate volume of blood received in culture bottles   Culture   Final    NO GROWTH 5 DAYS Performed at [facility], [address] [place], [place] [address]    Report Status 10/24/2020 FINAL  Final  Resp Panel by RT-PCR (Flu A&B, Covid) Nasopharyngeal Swab     Status: Abnormal   Collection Time: 10/23/20 12:06 AM   Specimen: Nasopharyngeal Swab; Nasopharyngeal(NP) swabs in vial transport medium  Result Value Ref Range Status   SARS Coronavirus 2 by RT PCR NEGATIVE NEGATIVE Final    Comment: (NOTE) SARS-CoV-2 target nucleic acids are NOT DETECTED.  The SARS-CoV-2 RNA is generally detectable in upper respiratory specimens during the acute phase of infection. The lowest concentration of SARS-CoV-2 viral copies this assay can detect is 138 copies/mL. A negative result does not preclude SARS-Cov-2 infection and should not be used as the sole basis for treatment or other patient management decisions. A negative result may occur with  improper specimen collection/handling, submission of specimen other than nasopharyngeal swab, presence of viral mutation(s) within the areas targeted by this assay, and inadequate number of viral copies(<138 copies/mL). A negative result must be combined with clinical observations, patient history, and epidemiological information. The expected result is Negative.  Fact Sheet for Patients:  BloggerCourse.comhttps://www.fda.gov/media/152166/download  Fact Sheet for Healthcare Providers:  SeriousBroker.ithttps://www.fda.gov/media/152162/download  This test is no t yet approved or cleared by the [place] FDA and  has been authorized for detection and/or diagnosis of SARS-CoV-2 by FDA under an Emergency Use Authorization (EUA). This EUA will remain  in effect (meaning this  test can be used) for the duration of the COVID-19 declaration under Section 564(b)(1) of the Act, 21 U.S.C.section 360bbb-3(b)(1), unless the authorization is terminated  or revoked sooner.       Influenza A by PCR POSITIVE (A) NEGATIVE Final   Influenza B by PCR NEGATIVE NEGATIVE Final    Comment: (NOTE) The Xpert Xpress SARS-CoV-2/FLU/RSV plus assay is intended as an aid in the diagnosis of influenza from Nasopharyngeal swab specimens and should not be used as a sole basis for treatment. Nasal washings and aspirates are unacceptable for Xpert Xpress SARS-CoV-2/FLU/RSV testing.  Fact Sheet for Patients: BloggerCourse.comhttps://www.fda.gov/media/152166/download  Fact  Sheet for Healthcare Providers: SeriousBroker.it  This test is not yet approved or cleared by the [place] and has been authorized for detection and/or diagnosis of SARS-CoV-2 by FDA under an Emergency Use Authorization (EUA). This EUA will remain in effect (meaning this test can be used) for the duration of the COVID-19 declaration under Section 564(b)(1) of the Act, 21 U.S.C. section 360bbb-3(b)(1), unless the authorization is terminated or revoked.  Performed at [facility], [address] [place], [place] [address]      Labs: Basic Metabolic Panel: Recent Labs  Lab 10/22/20 0658 10/23/20 0006 10/23/20 0523  NA 138 137 138  K 4.3 3.6 3.8  CL 105 105 103  CO2 26 21* 25  GLUCOSE 173* 174* 220*  BUN 26* 27* 29*  CREATININE 1.13 1.32* 1.35*  CALCIUM 8.8* 8.5* 8.5*   Liver Function Tests: Recent Labs  Lab 10/23/20 0006  AST 44*  ALT 43  ALKPHOS 50  BILITOT 0.4  PROT 7.4  ALBUMIN 3.8   No results for input(s): LIPASE, AMYLASE in the last 168 hours. No results for input(s): AMMONIA in the last 168 hours. CBC: Recent Labs  Lab 10/22/20 0658 10/23/20 0006 10/23/20 0523  WBC 13.0* 14.5* 12.2*  NEUTROABS 10.0* 11.1*  --   HGB 13.0 12.5* 12.3*  HCT 39.6 37.6*  37.5*  MCV 96.4 95.9 95.7  PLT 238 255 235   Cardiac Enzymes: No results for input(s): CKTOTAL, CKMB, CKMBINDEX, TROPONINI in the last 168 hours. BNP: BNP (last 3 results) Recent Labs    03/02/20 1951 10/19/20 1738 10/23/20 0006  BNP 62.8 70.7 339.3*    ProBNP (last 3 results) No results for input(s): PROBNP in the last 8760 hours.  CBG: Recent Labs  Lab 10/21/20 2035 10/22/20 0818  GLUCAP 157* 157*    Principal Problem:   COPD exacerbation (HCC) Active Problems:   Dyslipidemia   Depression   ANXIETY DISORDER, GENERALIZED   Essential hypertension, benign   Coronary atherosclerosis of native coronary artery   Sleep apnea- C-pap intol   Prediabetes   Chronic back pain   (HFpEF) heart failure with preserved ejection fraction (HCC)   Influenza A   Time coordinating discharge: 38 minutes.  Signed:        Jencarlo Bonadonna, DO [facility]  10/28/2020, 5:53 PM

## 2020-12-09 DIAGNOSIS — Z5181 Encounter for therapeutic drug level monitoring: Secondary | ICD-10-CM | POA: Diagnosis not present

## 2020-12-09 DIAGNOSIS — Z79899 Other long term (current) drug therapy: Secondary | ICD-10-CM | POA: Diagnosis not present

## 2020-12-09 DIAGNOSIS — M5136 Other intervertebral disc degeneration, lumbar region: Secondary | ICD-10-CM | POA: Diagnosis not present

## 2020-12-09 DIAGNOSIS — M791 Myalgia, unspecified site: Secondary | ICD-10-CM | POA: Diagnosis not present

## 2020-12-09 DIAGNOSIS — Z79891 Long term (current) use of opiate analgesic: Secondary | ICD-10-CM | POA: Diagnosis not present

## 2020-12-09 DIAGNOSIS — G894 Chronic pain syndrome: Secondary | ICD-10-CM | POA: Diagnosis not present

## 2020-12-09 DIAGNOSIS — M47816 Spondylosis without myelopathy or radiculopathy, lumbar region: Secondary | ICD-10-CM | POA: Diagnosis not present

## 2021-01-01 DIAGNOSIS — Z87891 Personal history of nicotine dependence: Secondary | ICD-10-CM | POA: Diagnosis not present

## 2021-01-01 DIAGNOSIS — I5032 Chronic diastolic (congestive) heart failure: Secondary | ICD-10-CM | POA: Diagnosis not present

## 2021-01-01 DIAGNOSIS — I25119 Atherosclerotic heart disease of native coronary artery with unspecified angina pectoris: Secondary | ICD-10-CM | POA: Diagnosis not present

## 2021-01-01 DIAGNOSIS — I10 Essential (primary) hypertension: Secondary | ICD-10-CM | POA: Diagnosis not present

## 2021-01-01 DIAGNOSIS — Z23 Encounter for immunization: Secondary | ICD-10-CM | POA: Diagnosis not present

## 2021-01-01 DIAGNOSIS — I110 Hypertensive heart disease with heart failure: Secondary | ICD-10-CM | POA: Diagnosis not present

## 2021-01-23 DIAGNOSIS — H905 Unspecified sensorineural hearing loss: Secondary | ICD-10-CM | POA: Diagnosis not present

## 2021-01-29 ENCOUNTER — Ambulatory Visit (INDEPENDENT_AMBULATORY_CARE_PROVIDER_SITE_OTHER): Payer: Medicare Other | Admitting: Podiatry

## 2021-01-29 ENCOUNTER — Other Ambulatory Visit: Payer: Self-pay

## 2021-01-29 DIAGNOSIS — M79675 Pain in left toe(s): Secondary | ICD-10-CM

## 2021-01-29 DIAGNOSIS — B351 Tinea unguium: Secondary | ICD-10-CM | POA: Diagnosis not present

## 2021-01-29 DIAGNOSIS — M79674 Pain in right toe(s): Secondary | ICD-10-CM

## 2021-02-04 ENCOUNTER — Encounter: Payer: Self-pay | Admitting: Podiatry

## 2021-02-04 NOTE — Progress Notes (Signed)
  Subjective:  Patient ID: [name] Loge., male    DOB: 1956-03-31,  MRN: [id number]  Chief Complaint  Patient presents with   Nail Problem    Nails are thick and discolored    65 y.o. male returns for the above complaint.  Patient presents with thickened elongated dystrophic toenails x10.  Patient not able to do it himself.  He would like for me to debride them down.  He states is painful with ambulation.  He has not seen anyone else prior to seeing me.  He denies any other acute complaints.  He is a prediabetic  Objective:  There were no vitals filed for this visit. Podiatric Exam: Vascular: dorsalis pedis and posterior tibial pulses are palpable bilateral. Capillary return is immediate. Temperature gradient is WNL. Skin turgor WNL  Sensorium: Normal Semmes Weinstein monofilament test. Normal tactile sensation bilaterally. Nail Exam: Pt has thick disfigured discolored nails with subungual debris noted bilateral entire nail hallux through fifth toenails.  Pain on palpation to the nails. Ulcer Exam: There is no evidence of ulcer or pre-ulcerative changes or infection. Orthopedic Exam: Muscle tone and strength are WNL. No limitations in general ROM. No crepitus or effusions noted. HAV  B/L.  Hammer toes 2-5  B/L. Skin: No Porokeratosis. No infection or ulcers    Assessment & Plan:   1. Pain due to onychomycosis of toenails of both feet     Patient was evaluated and treated and all questions answered.  Onychomycosis with pain  -Nails palliatively debrided as below. -Educated on self-care  Procedure: Nail Debridement Rationale: pain  Type of Debridement: manual, sharp debridement. Instrumentation: Nail nipper, rotary burr. Number of Nails: 10  Procedures and Treatment: Consent by patient was obtained for treatment procedures. The patient understood the discussion of treatment and procedures well. All questions were answered thoroughly reviewed. Debridement of mycotic and  hypertrophic toenails, 1 through 5 bilateral and clearing of subungual debris. No ulceration, no infection noted.  Return Visit-Office Procedure: Patient instructed to return to the office for a follow up visit 3 months for continued evaluation and treatment.  Nicholes Rough, DPM    No follow-ups on file.

## 2021-02-19 ENCOUNTER — Telehealth: Payer: Self-pay | Admitting: *Deleted

## 2021-02-19 ENCOUNTER — Other Ambulatory Visit: Payer: Self-pay

## 2021-02-19 ENCOUNTER — Ambulatory Visit (INDEPENDENT_AMBULATORY_CARE_PROVIDER_SITE_OTHER): Payer: Medicare Other | Admitting: Podiatry

## 2021-02-19 DIAGNOSIS — L600 Ingrowing nail: Secondary | ICD-10-CM | POA: Diagnosis not present

## 2021-02-19 NOTE — Telephone Encounter (Signed)
"  I was there at 2:45 pm.  My left big toe is bleeding through my sock and my shoe is full of blood."  Are you on blood thinners?  "No, I'm not on blood thinners.  It's really poring out."  Can you come in so I can redress it?  "I'll be there in about 20 minutes."   [name] arrived at the office at 4:50 pm.  His socks were soiled with blood.  I did not remove his bandages.  I cleaned his right foot up and I reinforced both big toes with additional gauze and coflex, I applied a little compression.  I advised him to loosen the bandages if they feel too tight later in the evening.  I told him to elevate his feet as much as possible.  He said they felt good before he departed.

## 2021-02-23 ENCOUNTER — Encounter: Payer: Self-pay | Admitting: Podiatry

## 2021-02-23 NOTE — Progress Notes (Signed)
Subjective:  Patient ID: [name] Loge., male    DOB: [birth year],  MRN: [id number]  Chief Complaint  Patient presents with   Ingrown Toenail    Bilateral nail removal    65 y.o. male presents with the above complaint.  Patient presents with dystrophic bilateral hallux and left third digit nail.  Patient states is very painful to touch they do ingrown and he has been ill with this for a long period of time.  He would like to have the entire nail removed and made permanent with Phenol matricectomy.  He would like to have all of this done to all 3 nails.  He denies any other acute complaints.   Review of Systems: Negative except as noted in the HPI. Denies N/V/F/Ch.  Past Medical History:  Diagnosis Date   Anxiety disorder    Arthritis    CAD (coronary artery disease)    a. ~ 2000 cath at [facility];  b. 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   CHF (congestive heart failure) (HCC)    Chronic back pain    Chronic hip pain    COPD (chronic obstructive pulmonary disease) (HCC)    Depression    Essential hypertension, benign    GERD (gastroesophageal reflux disease)    Headache(784.0)    History of alcohol abuse    History of cocaine abuse (HCC)    Hyperlipidemia    Obesity    Pneumonia     Current Outpatient Medications:    albuterol (PROVENTIL) (2.5 MG/3ML) 0.083% nebulizer solution, Take 3 mLs (2.5 mg total) by nebulization every 6 (six) hours as needed for wheezing or shortness of breath., Disp: 360 mL, Rfl: 0   aspirin 81 MG tablet, Take 1 tablet (81 mg total) by mouth daily., Disp: 30 tablet, Rfl: 0   budesonide-formoterol (SYMBICORT) 160-4.5 MCG/ACT inhaler, Inhale 2 puffs into the lungs 2 (two) times daily., Disp: , Rfl:    buPROPion (WELLBUTRIN XL) 150 MG 24 hr tablet, Take 300 mg by mouth daily., Disp: , Rfl:    citalopram (CELEXA) 10  MG tablet, Take 10 mg by mouth daily., Disp: , Rfl:    [employer] MELATONIN 3 MG TABS, Take 1 tablet by mouth at bedtime. , Disp: , Rfl: 11   fluticasone (FLONASE) 50 MCG/ACT nasal spray, Place 2 sprays into both nostrils 2 (two) times daily., Disp: , Rfl:    furosemide (LASIX) 20 MG tablet, Take 40 mg by mouth every morning. , Disp: , Rfl:    HYDROcodone-homatropine (HYCODAN) 5-1.5 MG/5ML syrup, Take 5 mLs by mouth every 6 (six) hours as needed for cough., Disp: 100 mL, Rfl: 0   hydrOXYzine (ATARAX/VISTARIL) 25 MG tablet, Take 50 mg by mouth at bedtime., Disp: , Rfl:    isosorbide mononitrate (IMDUR) 60 MG 24 hr tablet, Take 60 mg by mouth daily., Disp: , Rfl:    metFORMIN (GLUCOPHAGE-XR) 500 MG 24 hr tablet, Take 500 mg by mouth daily before supper., Disp: , Rfl:    mirtazapine (REMERON) 15 MG tablet, Take 15 mg by mouth at bedtime., Disp: , Rfl:    nitroGLYCERIN (NITROSTAT) 0.4 MG SL tablet, Place 0.4 mg under the tongue every 5 (five) minutes as needed. For chest pain, Disp: , Rfl:    omeprazole (PRILOSEC) 20 MG capsule, Take 20 mg by mouth every morning. ,  Disp: , Rfl:    oxyCODONE-acetaminophen (PERCOCET) 10-325 MG tablet, Take 1 tablet by mouth every 4 (four) hours as needed for pain., Disp: , Rfl:    PROAIR HFA 108 (90 BASE) MCG/ACT inhaler, Inhale 2 puffs into the lungs every 4 (four) hours as needed for wheezing or shortness of breath., Disp: , Rfl:    REPATHA SURECLICK 140 MG/ML SOAJ, Inject 140 mg into the skin every 14 (fourteen) days., Disp: , Rfl:    triamcinolone ointment (KENALOG) 0.1 %, Apply 1 application topically 2 (two) times daily., Disp: , Rfl:   Social History   Tobacco Use  Smoking Status Former   Packs/day: 0.50   Years: 39.00   Pack years: 19.50   Types: Cigarettes   Quit date: 09/15/2015   Years since quitting: 5.4  Smokeless Tobacco Never    Allergies  Allergen Reactions   Fluticasone Furoate-Vilanterol Swelling    Tongue Swelling   Tiotropium Swelling     Tongue Swelling    Ativan [Lorazepam] Other (See Comments)    Altered Mental Status    Haloperidol Decanoate Other (See Comments)    Altered Mental Status    Atorvastatin Other (See Comments)    Pain to joints   Sulfamethoxazole-Trimethoprim Itching   Objective:  There were no vitals filed for this visit. There is no height or weight on file to calculate BMI. Constitutional Well developed. Well nourished.  Vascular Dorsalis pedis pulses palpable bilaterally. Posterior tibial pulses palpable bilaterally. Capillary refill normal to all digits.  No cyanosis or clubbing noted. Pedal hair growth normal.  Neurologic Normal speech. Oriented to person, place, and time. Epicritic sensation to light touch grossly present bilaterally.  Dermatologic Pain on palpation of the entire/total nail on 1st and 3rd digit of the bilaterally except toenail is on the left side only No other open wounds. No skin lesions.  Orthopedic: Normal joint ROM without pain or crepitus bilaterally. No visible deformities. No bony tenderness.   Radiographs: None Assessment:   1. Ingrown left big toenail   2. Ingrown right big toenail   3. Ingrown nail of third toe of left foot    Plan:  Patient was evaluated and treated and all questions answered.  Nail contusion/dystrophy bilateral hallux and left third toenail, bilaterally -Patient elects to proceed with minor surgery to remove entire toenail today. Consent reviewed and signed by patient. -Entire/total nail excised. See procedure note. -Educated on post-procedure care including soaking. Written instructions provided and reviewed. -Patient to follow up in 2 weeks for nail check.  Procedure: Excision of entire/total nail  Location: Bilateral 1st toe and left 3rd toe digit Anesthesia: Lidocaine 1% plain; 1.5 mL and Marcaine 0.5% plain; 1.5 mL, digital block. Skin Prep: Betadine. Dressing: Silvadene; telfa; dry, sterile, compression  dressing. Technique: Following skin prep, the toe was exsanguinated and a tourniquet was secured at the base of the toe. The affected nail border was freed and excised.  Phenol matricectomy was performed in standard technique.  The tourniquet was then removed and sterile dressing applied. Disposition: Patient tolerated procedure well. Patient to return in 2 weeks for follow-up.   No follow-ups on file.

## 2021-03-10 DIAGNOSIS — M179 Osteoarthritis of knee, unspecified: Secondary | ICD-10-CM | POA: Diagnosis not present

## 2021-03-10 DIAGNOSIS — M47816 Spondylosis without myelopathy or radiculopathy, lumbar region: Secondary | ICD-10-CM | POA: Diagnosis not present

## 2021-03-10 DIAGNOSIS — G894 Chronic pain syndrome: Secondary | ICD-10-CM | POA: Diagnosis not present

## 2021-03-10 DIAGNOSIS — M791 Myalgia, unspecified site: Secondary | ICD-10-CM | POA: Diagnosis not present

## 2021-03-10 DIAGNOSIS — M5136 Other intervertebral disc degeneration, lumbar region: Secondary | ICD-10-CM | POA: Diagnosis not present

## 2021-03-10 DIAGNOSIS — Z79899 Other long term (current) drug therapy: Secondary | ICD-10-CM | POA: Diagnosis not present

## 2021-03-17 IMAGING — DX DG CHEST 1V PORT
2 series · 2 of 2 positions shown · non-contrast
Comparison: 10/19/2020

CLINICAL DATA: Cough and shortness of breath

EXAM:
PORTABLE CHEST 1 VIEW

[chest ap (1 of 2)]
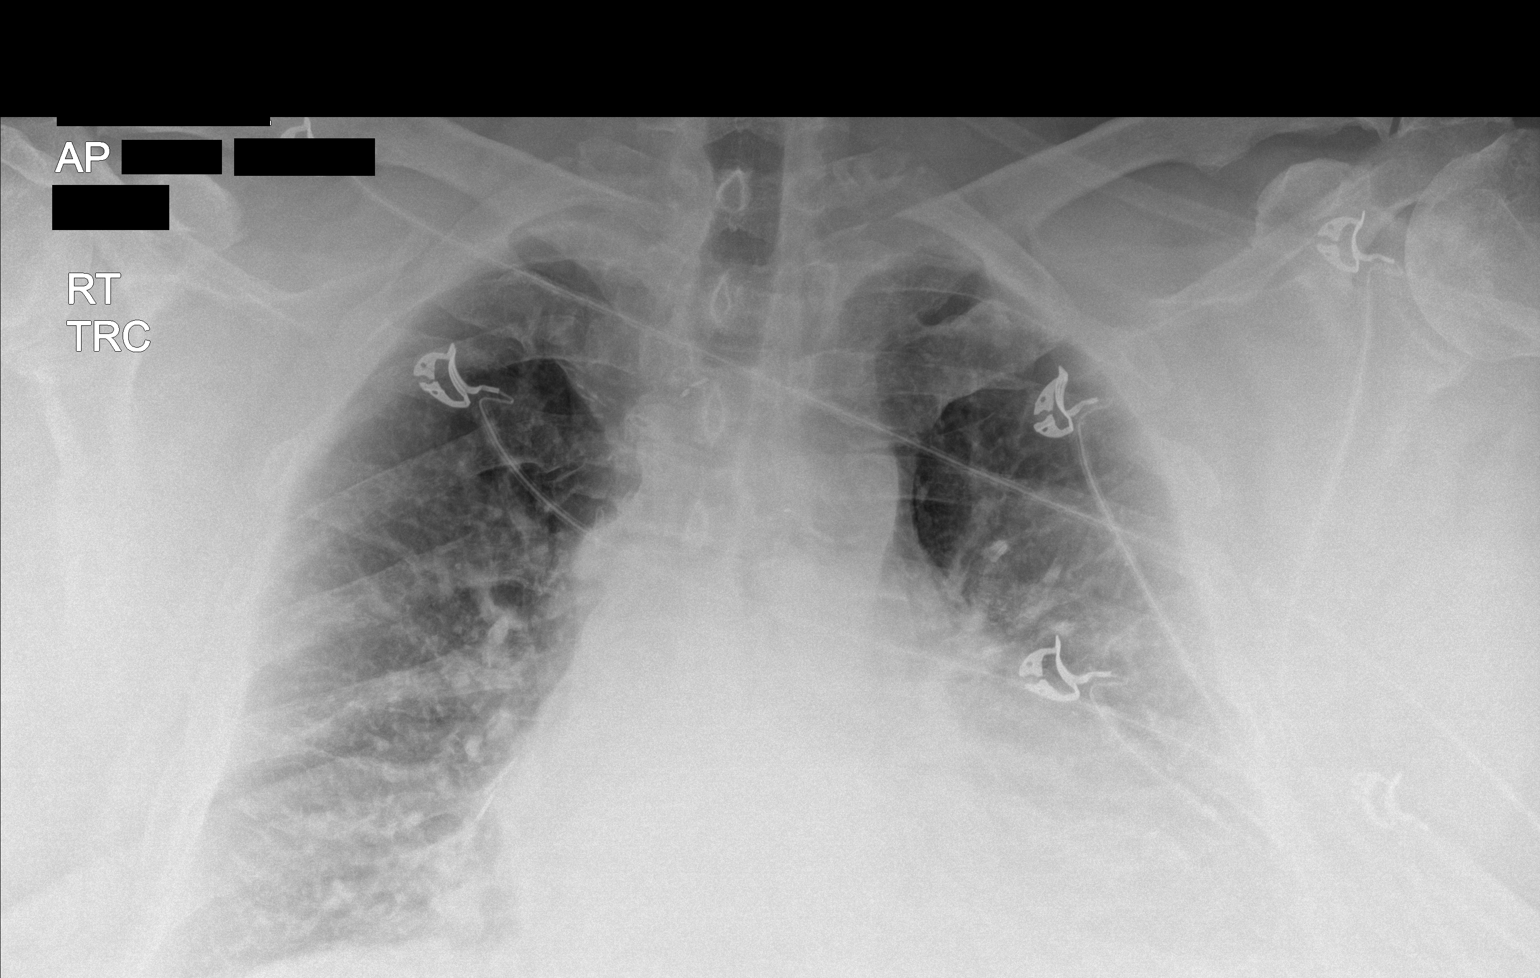

[chest ap (2 of 2)]
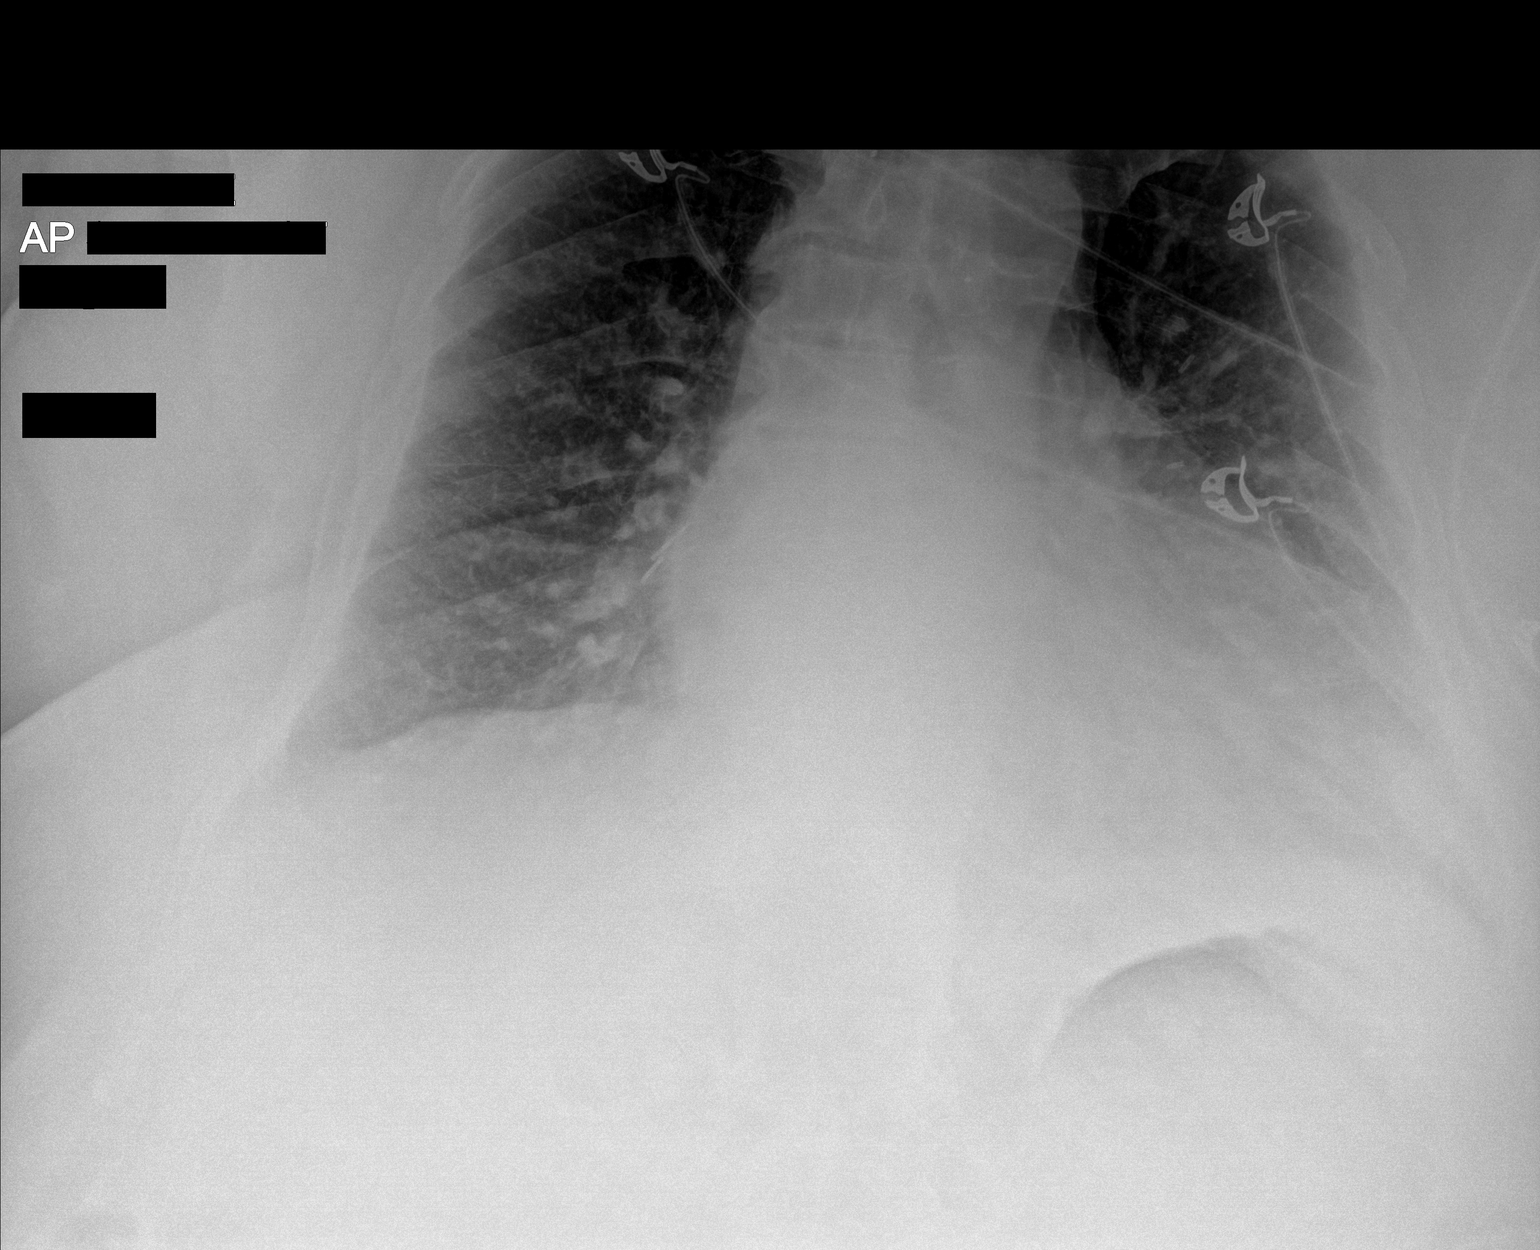

[2 of 2 positions shown; findings below may reference images not displayed]

FINDINGS: There is cardiomegaly with vascular congestion. Aortic
calcifications are noted. There is no pneumothorax. No large pleural
effusion. Bibasilar atelectasis is noted. Evaluation is limited by
patient body habitus.
IMPRESSION: Cardiomegaly with vascular congestion.

## 2021-05-08 DIAGNOSIS — I110 Hypertensive heart disease with heart failure: Secondary | ICD-10-CM | POA: Diagnosis not present

## 2021-05-08 DIAGNOSIS — R1319 Other dysphagia: Secondary | ICD-10-CM | POA: Diagnosis not present

## 2021-05-08 DIAGNOSIS — R7303 Prediabetes: Secondary | ICD-10-CM | POA: Diagnosis not present

## 2021-05-08 DIAGNOSIS — I10 Essential (primary) hypertension: Secondary | ICD-10-CM | POA: Diagnosis not present

## 2021-05-08 DIAGNOSIS — J449 Chronic obstructive pulmonary disease, unspecified: Secondary | ICD-10-CM | POA: Diagnosis not present

## 2021-05-08 DIAGNOSIS — K222 Esophageal obstruction: Secondary | ICD-10-CM | POA: Diagnosis not present

## 2021-05-08 DIAGNOSIS — I5032 Chronic diastolic (congestive) heart failure: Secondary | ICD-10-CM | POA: Diagnosis not present

## 2021-05-08 DIAGNOSIS — E7800 Pure hypercholesterolemia, unspecified: Secondary | ICD-10-CM | POA: Diagnosis not present

## 2021-05-08 DIAGNOSIS — I2510 Atherosclerotic heart disease of native coronary artery without angina pectoris: Secondary | ICD-10-CM | POA: Diagnosis not present

## 2021-05-17 ENCOUNTER — Other Ambulatory Visit: Payer: Self-pay

## 2021-05-17 ENCOUNTER — Emergency Department
Admission: EM | Admit: 2021-05-17 | Discharge: 2021-05-17 | Disposition: A | Discharge status: Home / Self Care | Payer: Medicare Other | Attending: Emergency Medicine | Admitting: Emergency Medicine

## 2021-05-17 ENCOUNTER — Emergency Department: Payer: Medicare Other

## 2021-05-17 DIAGNOSIS — I2510 Atherosclerotic heart disease of native coronary artery without angina pectoris: Secondary | ICD-10-CM | POA: Diagnosis not present

## 2021-05-17 DIAGNOSIS — Z87891 Personal history of nicotine dependence: Secondary | ICD-10-CM | POA: Diagnosis not present

## 2021-05-17 DIAGNOSIS — M542 Cervicalgia: Secondary | ICD-10-CM

## 2021-05-17 DIAGNOSIS — S299XXA Unspecified injury of thorax, initial encounter: Secondary | ICD-10-CM | POA: Diagnosis not present

## 2021-05-17 DIAGNOSIS — Y9289 Other specified places as the place of occurrence of the external cause: Secondary | ICD-10-CM | POA: Insufficient documentation

## 2021-05-17 DIAGNOSIS — M546 Pain in thoracic spine: Secondary | ICD-10-CM

## 2021-05-17 DIAGNOSIS — I6523 Occlusion and stenosis of bilateral carotid arteries: Secondary | ICD-10-CM | POA: Diagnosis not present

## 2021-05-17 DIAGNOSIS — J441 Chronic obstructive pulmonary disease with (acute) exacerbation: Secondary | ICD-10-CM | POA: Insufficient documentation

## 2021-05-17 DIAGNOSIS — Z951 Presence of aortocoronary bypass graft: Secondary | ICD-10-CM | POA: Insufficient documentation

## 2021-05-17 DIAGNOSIS — S199XXA Unspecified injury of neck, initial encounter: Secondary | ICD-10-CM | POA: Diagnosis not present

## 2021-05-17 DIAGNOSIS — Z7951 Long term (current) use of inhaled steroids: Secondary | ICD-10-CM | POA: Diagnosis not present

## 2021-05-17 DIAGNOSIS — M2578 Osteophyte, vertebrae: Secondary | ICD-10-CM | POA: Diagnosis not present

## 2021-05-17 DIAGNOSIS — W19XXXA Unspecified fall, initial encounter: Secondary | ICD-10-CM

## 2021-05-17 DIAGNOSIS — Z7982 Long term (current) use of aspirin: Secondary | ICD-10-CM | POA: Diagnosis not present

## 2021-05-17 DIAGNOSIS — S069X9A Unspecified intracranial injury with loss of consciousness of unspecified duration, initial encounter: Secondary | ICD-10-CM | POA: Diagnosis not present

## 2021-05-17 DIAGNOSIS — J439 Emphysema, unspecified: Secondary | ICD-10-CM | POA: Diagnosis not present

## 2021-05-17 DIAGNOSIS — Z96642 Presence of left artificial hip joint: Secondary | ICD-10-CM | POA: Diagnosis not present

## 2021-05-17 DIAGNOSIS — R519 Headache, unspecified: Secondary | ICD-10-CM | POA: Diagnosis not present

## 2021-05-17 DIAGNOSIS — I110 Hypertensive heart disease with heart failure: Secondary | ICD-10-CM | POA: Insufficient documentation

## 2021-05-17 DIAGNOSIS — W1839XA Other fall on same level, initial encounter: Secondary | ICD-10-CM | POA: Diagnosis not present

## 2021-05-17 DIAGNOSIS — I509 Heart failure, unspecified: Secondary | ICD-10-CM | POA: Insufficient documentation

## 2021-05-17 MED ORDER — HYDROMORPHONE HCL 1 MG/ML IJ SOLN
2.0000 mg | Freq: Once | INTRAMUSCULAR | Status: AC
Start: 2021-05-17 — End: 2021-05-17
  Administered 2021-05-17: 2 mg via INTRAMUSCULAR
  Filled 2021-05-17: qty 2

## 2021-05-17 MED ORDER — DEXAMETHASONE SODIUM PHOSPHATE 10 MG/ML IJ SOLN
10.0000 mg | Freq: Once | INTRAMUSCULAR | Status: AC
  Administered 2021-05-17: 10 mg via INTRAMUSCULAR
  Filled 2021-05-17: qty 1

## 2021-05-17 MED ORDER — KETOROLAC TROMETHAMINE 60 MG/2ML IM SOLN
60.0000 mg | Freq: Once | INTRAMUSCULAR | Status: AC
  Administered 2021-05-17: 60 mg via INTRAMUSCULAR
  Filled 2021-05-17: qty 2

## 2021-05-17 NOTE — ED Triage Notes (Signed)
Pt fell on Friday with loc injuring head and neck and upper back. Pt states he cannot lie down due to pain. PA in triage completing MSE.

## 2021-05-17 NOTE — ED Provider Notes (Signed)
[facility] Emergency Department Provider Note  ____________________________________________   Event Date/Time   First MD Initiated Contact with Patient 05/17/21 2236     (approximate)  I have reviewed the triage vital signs and the nursing notes.   HISTORY  Chief Complaint Neck Pain    HPI [name] NOBOA Sr. is a 65 y.o. male here with back pain.  The patient states that he fell on Friday.  He fell backwards, landing on his lower cervical and upper thoracic spine.  He experienced immediate onset of pain.  Since then, he has had aching, throbbing, significant pain in his back.  Is worse with any movement.  Is worse with deep inspiration.  He said no shortness of breath.  He states he believes he may have hit his head and lost consciousness.  He does not take blood thinners.  No specific alleviating factors.  He takes oxycodone 10 mg every 4 hours and has been taking this without significant relief.  This is a chronic medication for him.    Past Medical History:  Diagnosis Date   Anxiety disorder    Arthritis    CAD (coronary artery disease)    a. ~ 2000 cath at [facility];  b. 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   CHF (congestive heart failure) (HCC)    Chronic back pain    Chronic hip pain    COPD (chronic obstructive pulmonary disease) (HCC)    Depression    Essential hypertension, benign    GERD (gastroesophageal reflux disease)    Headache(784.0)    History of alcohol abuse    History of cocaine abuse (HCC)    Hyperlipidemia    Obesity    Pneumonia     Patient Active Problem List   Diagnosis Date Noted   COPD exacerbation (HCC) 10/19/2020   Influenza A 10/19/2020   Respiratory failure (HCC) 10/22/2018   COPD with acute exacerbation (HCC) 09/08/2018   Hyperthyroidism 07/26/2018   NSTEMI (non-ST  elevated myocardial infarction) (HCC) 07/11/2015   Chest pain 04/30/2015   Syncope 04/30/2015   Orthostatic syncope 04/30/2015   Chronic back pain    (HFpEF) heart failure with preserved ejection fraction (HCC)    History of cocaine abuse (HCC)    History of alcohol abuse    Prediabetes 08/12/2014   Tobacco abuse 09/21/2013   Sleep apnea- C-pap intol 09/14/2013   Coronary atherosclerosis of native coronary artery    OBESITY, MORBID 05/29/2008   Dyslipidemia 01/19/2007   ANXIETY DISORDER, GENERALIZED 01/19/2007   Depression 10/20/2006   Essential hypertension, benign 10/20/2006   COPD (chronic obstructive pulmonary disease) (HCC) 10/20/2006    Past Surgical History:  Procedure Laterality Date   BACK SURGERY     CHOLECYSTECTOMY     CORONARY ARTERY BYPASS GRAFT     HIP SURGERY     rt   JOINT REPLACEMENT     LEFT HEART CATHETERIZATION WITH CORONARY ANGIOGRAM N/A 09/12/2013   Procedure: LEFT HEART CATHETERIZATION WITH CORONARY ANGIOGRAM;  Surgeon: Kathleene Hazel, MD;  Location: [facility] CATH LAB;  Service: Cardiovascular;  Laterality: N/A;   NASAL/SINUS ENDOSCOPY     TOTAL HIP ARTHROPLASTY  05/08/2012   Procedure: TOTAL HIP ARTHROPLASTY;  Surgeon: Nestor Lewandowsky, MD;  Location: [facility] OR;  Service: Orthopedics;  Laterality: Left;   TOTAL  HIP ARTHROPLASTY  2014 per pt    Prior to Admission medications   Medication Sig Start Date End Date Taking? Authorizing Provider  albuterol (PROVENTIL) (2.5 MG/3ML) 0.083% nebulizer solution Take 3 mLs (2.5 mg total) by nebulization every 6 (six) hours as needed for wheezing or shortness of breath. 10/22/20   Lurene Shadow, MD  aspirin 81 MG tablet Take 1 tablet (81 mg total) by mouth daily. 05/31/12   Kathlen Mody, MD  budesonide-formoterol (SYMBICORT) 160-4.5 MCG/ACT inhaler Inhale 2 puffs into the lungs 2 (two) times daily.    [provider]  buPROPion (WELLBUTRIN XL) 150 MG 24 hr tablet Take 300 mg by mouth daily.    [provider]  citalopram (CELEXA) 10 MG tablet Take 10 mg by mouth daily. 02/29/20   [provider]  [facility] MELATONIN 3 MG TABS Take 1 tablet by mouth at bedtime.  03/22/18   [provider]  fluticasone (FLONASE) 50 MCG/ACT nasal spray Place 2 sprays into both nostrils 2 (two) times daily. 12/04/19   [provider]  furosemide (LASIX) 20 MG tablet Take 40 mg by mouth every morning.     [provider]  HYDROcodone-homatropine (HYCODAN) 5-1.5 MG/5ML syrup Take 5 mLs by mouth every 6 (six) hours as needed for cough. 10/22/20   Lurene Shadow, MD  hydrOXYzine (ATARAX/VISTARIL) 25 MG tablet Take 50 mg by mouth at bedtime. 02/10/20   [provider]  isosorbide mononitrate (IMDUR) 60 MG 24 hr tablet Take 60 mg by mouth daily. 06/22/18   [provider]  metFORMIN (GLUCOPHAGE-XR) 500 MG 24 hr tablet Take 500 mg by mouth daily before supper. 09/26/20   [provider]  mirtazapine (REMERON) 15 MG tablet Take 15 mg by mouth at bedtime. 12/21/19   [provider]  nitroGLYCERIN (NITROSTAT) 0.4 MG SL tablet Place 0.4 mg under the tongue every 5 (five) minutes as needed. For chest pain    [provider]  omeprazole (PRILOSEC) 20 MG capsule Take 20 mg by mouth every morning.     [provider]  oxyCODONE-acetaminophen (PERCOCET) 10-325 MG tablet Take 1 tablet by mouth every 4 (four) hours as needed for pain.    [provider]  PROAIR HFA 108 (90 BASE) MCG/ACT inhaler Inhale 2 puffs into the lungs every 4 (four) hours as needed for wheezing or shortness of breath.    [provider]  REPATHA SURECLICK 140 MG/ML SOAJ Inject 140 mg into the skin every 14 (fourteen) days.    [provider]  triamcinolone ointment (KENALOG) 0.1 % Apply 1 application topically 2 (two) times daily. 09/25/20   [provider]    Allergies Fluticasone furoate-vilanterol, Tiotropium, Ativan [lorazepam], Haloperidol decanoate,  Atorvastatin, and Sulfamethoxazole-trimethoprim  Family History  Problem Relation Age of Onset   Heart attack Mother    Heart attack Brother    Heart attack Sister    Heart attack Father     Social History Social History   Tobacco Use   Smoking status: Former    Packs/day: 0.50    Years: 39.00    Pack years: 19.50    Types: Cigarettes    Quit date: 09/15/2015    Years since quitting: 5.6   Smokeless tobacco: Never  Vaping Use   Vaping Use: Never used  Substance Use Topics   Alcohol use: Yes    Alcohol/week: 0.0 standard drinks   Drug use: No    Review of Systems  Review of Systems  Constitutional:  Negative for chills and fever.  HENT:  Negative for sore throat.   Respiratory:  Negative for shortness of breath.   Cardiovascular:  Negative for chest pain.  Gastrointestinal:  Negative for abdominal pain.  Genitourinary:  Negative for flank pain.  Musculoskeletal:  Positive for arthralgias and back pain. Negative for neck pain.  Skin:  Negative for rash and wound.  Allergic/Immunologic: Negative for immunocompromised state.  Neurological:  Negative for weakness and numbness.  Hematological:  Does not bruise/bleed easily.  All other systems reviewed and are negative.   ____________________________________________  PHYSICAL EXAM:      VITAL SIGNS: ED Triage Vitals  Enc Vitals Group     BP 05/17/21 1922 109/71     Pulse Rate 05/17/21 1921 70     Resp 05/17/21 1921 (!) 22     Temp 05/17/21 1921 98.8 F (37.1 C)     Temp Source 05/17/21 1921 Axillary     SpO2 05/17/21 1921 94 %     Weight 05/17/21 1922 (!) 381 lb (172.8 kg)     Height 05/17/21 1922 6\' 3"  (1.905 m)     Head Circumference --      Peak Flow --      Pain Score 05/17/21 1921 8     Pain Loc --      Pain Edu? --      Excl. in GC? --      Physical Exam Vitals and nursing note reviewed.  Constitutional:      General: He is not in acute distress.    Appearance: He is well-developed.  HENT:      Head: Normocephalic and atraumatic.  Eyes:     Conjunctiva/sclera: Conjunctivae normal.  Cardiovascular:     Rate and Rhythm: Normal rate and regular rhythm.     Heart sounds: Normal heart sounds.  Pulmonary:     Effort: Pulmonary effort is normal. No respiratory distress.     Breath sounds: No wheezing.  Abdominal:     General: There is no distension.  Musculoskeletal:     Cervical back: Neck supple.     Comments: Moderate paraspinal tenderness throughout the lower cervical and upper thoracic spine.  No midline deformity.  No step-offs.  Skin:    General: Skin is warm.     Capillary Refill: Capillary refill takes less than 2 seconds.     Findings: No rash.  Neurological:     Mental Status: He is alert and oriented to person, place, and time.     Motor: No abnormal muscle tone.     Comments: Strength 5 and 5 bilateral lower extremities.  Normal sensation to light touch.  Quad reflexes 2+ and symmetric.      ____________________________________________   LABS (all labs ordered are listed, but only abnormal results are displayed)  Labs Reviewed - No data to display  ____________________________________________  EKG:  ________________________________________  RADIOLOGY All imaging, including plain films, CT scans, and ultrasounds, independently reviewed by me, and interpretations confirmed via formal radiology reads.  ED MD interpretation:   CT head: Negative CT spine cervical: Negative CT spine thoracic: Negative  Official radiology report(s): CT HEAD WO CONTRAST (05/19/21)  Result Date: 05/17/2021 CLINICAL DATA:  t fell on Friday with loc injuring head and neck and upper back. Pt states he cannot lie down due to pain EXAM: CT HEAD WITHOUT CONTRAST CT THORACIC SPINE WITHOUT CONTRAST CT CERVICAL SPINE WITHOUT CONTRAST TECHNIQUE: Multidetector CT imaging of the head, cervical, thoracic spine  was performed following the standard protocol without intravenous contrast. Multiplanar  CT image reconstructions of the cervical spine were also generated. COMPARISON:  CT angiography chest 07/19/2020 FINDINGS: CT HEAD FINDINGS Brain: No evidence of large-territorial acute infarction. No parenchymal hemorrhage. No mass lesion. No extra-axial collection. No mass effect or midline shift. No hydrocephalus. Basilar cisterns are patent. Vascular: No hyperdense vessel. Atherosclerotic calcifications are present within the cavernous internal carotid arteries. Skull: No acute fracture or focal lesion. Surgical changes of the left orbital floor. Sinuses/Orbits: Paranasal sinuses and mastoid air cells are clear. The orbits are unremarkable. Other: None. CT CERVICAL SPINE FINDINGS Alignment: Normal. Skull base and vertebrae: Multilevel degenerative changes of the spine. No severe osseous neural foraminal or central canal stenosis. No acute fracture. No aggressive appearing focal osseous lesion or focal pathologic process. Soft tissues and spinal canal: No prevertebral fluid or swelling. No visible canal hematoma. Upper chest: Biapical paraseptal emphysematous changes. Other: None. CT THORACIC SPINE FINDINGS Alignment: Normal. Vertebrae: Multilevel osteophyte formation. No acute fracture or focal pathologic process. Paraspinal and other soft tissues: Negative. Disc levels: Maintained. Visualized portions of the chest and abdomen: Atherosclerotic plaque. Coronary artery calcifications. IMPRESSION: 1. No acute intracranial abnormality. 2. No acute displaced fracture or traumatic listhesis of the cervical spine. 3. No acute displaced fracture or traumatic listhesis of the thoracic spine. 4. Aortic Atherosclerosis (ICD10-I70.0) and Emphysema (ICD10-J43.9). Electronically Signed   By: Tish Frederickson M.D.   On: 05/17/2021 20:18   CT Cervical Spine Wo Contrast  Result Date: 05/17/2021 CLINICAL DATA:  t fell on Friday with loc injuring head and neck and upper back. Pt states he cannot lie down due to pain EXAM: CT  HEAD WITHOUT CONTRAST CT THORACIC SPINE WITHOUT CONTRAST CT CERVICAL SPINE WITHOUT CONTRAST TECHNIQUE: Multidetector CT imaging of the head, cervical, thoracic spine was performed following the standard protocol without intravenous contrast. Multiplanar CT image reconstructions of the cervical spine were also generated. COMPARISON:  CT angiography chest 07/19/2020 FINDINGS: CT HEAD FINDINGS Brain: No evidence of large-territorial acute infarction. No parenchymal hemorrhage. No mass lesion. No extra-axial collection. No mass effect or midline shift. No hydrocephalus. Basilar cisterns are patent. Vascular: No hyperdense vessel. Atherosclerotic calcifications are present within the cavernous internal carotid arteries. Skull: No acute fracture or focal lesion. Surgical changes of the left orbital floor. Sinuses/Orbits: Paranasal sinuses and mastoid air cells are clear. The orbits are unremarkable. Other: None. CT CERVICAL SPINE FINDINGS Alignment: Normal. Skull base and vertebrae: Multilevel degenerative changes of the spine. No severe osseous neural foraminal or central canal stenosis. No acute fracture. No aggressive appearing focal osseous lesion or focal pathologic process. Soft tissues and spinal canal: No prevertebral fluid or swelling. No visible canal hematoma. Upper chest: Biapical paraseptal emphysematous changes. Other: None. CT THORACIC SPINE FINDINGS Alignment: Normal. Vertebrae: Multilevel osteophyte formation. No acute fracture or focal pathologic process. Paraspinal and other soft tissues: Negative. Disc levels: Maintained. Visualized portions of the chest and abdomen: Atherosclerotic plaque. Coronary artery calcifications. IMPRESSION: 1. No acute intracranial abnormality. 2. No acute displaced fracture or traumatic listhesis of the cervical spine. 3. No acute displaced fracture or traumatic listhesis of the thoracic spine. 4. Aortic Atherosclerosis (ICD10-I70.0) and Emphysema (ICD10-J43.9).  Electronically Signed   By: Tish Frederickson M.D.   On: 05/17/2021 20:18   CT Thoracic Spine Wo Contrast  Result Date: 05/17/2021 CLINICAL DATA:  t fell on Friday with loc injuring head and neck and upper back. Pt states he cannot lie down due to pain  EXAM: CT HEAD WITHOUT CONTRAST CT THORACIC SPINE WITHOUT CONTRAST CT CERVICAL SPINE WITHOUT CONTRAST TECHNIQUE: Multidetector CT imaging of the head, cervical, thoracic spine was performed following the standard protocol without intravenous contrast. Multiplanar CT image reconstructions of the cervical spine were also generated. COMPARISON:  CT angiography chest 07/19/2020 FINDINGS: CT HEAD FINDINGS Brain: No evidence of large-territorial acute infarction. No parenchymal hemorrhage. No mass lesion. No extra-axial collection. No mass effect or midline shift. No hydrocephalus. Basilar cisterns are patent. Vascular: No hyperdense vessel. Atherosclerotic calcifications are present within the cavernous internal carotid arteries. Skull: No acute fracture or focal lesion. Surgical changes of the left orbital floor. Sinuses/Orbits: Paranasal sinuses and mastoid air cells are clear. The orbits are unremarkable. Other: None. CT CERVICAL SPINE FINDINGS Alignment: Normal. Skull base and vertebrae: Multilevel degenerative changes of the spine. No severe osseous neural foraminal or central canal stenosis. No acute fracture. No aggressive appearing focal osseous lesion or focal pathologic process. Soft tissues and spinal canal: No prevertebral fluid or swelling. No visible canal hematoma. Upper chest: Biapical paraseptal emphysematous changes. Other: None. CT THORACIC SPINE FINDINGS Alignment: Normal. Vertebrae: Multilevel osteophyte formation. No acute fracture or focal pathologic process. Paraspinal and other soft tissues: Negative. Disc levels: Maintained. Visualized portions of the chest and abdomen: Atherosclerotic plaque. Coronary artery calcifications. IMPRESSION: 1. No  acute intracranial abnormality. 2. No acute displaced fracture or traumatic listhesis of the cervical spine. 3. No acute displaced fracture or traumatic listhesis of the thoracic spine. 4. Aortic Atherosclerosis (ICD10-I70.0) and Emphysema (ICD10-J43.9). Electronically Signed   By: Tish Frederickson M.D.   On: 05/17/2021 20:18    ____________________________________________  PROCEDURES   Procedure(s) performed (including Critical Care):  Procedures  ____________________________________________  INITIAL IMPRESSION / MDM / ASSESSMENT AND PLAN / ED COURSE  As part of my medical decision making, I reviewed the following data within the electronic MEDICAL RECORD NUMBER Nursing notes reviewed and incorporated, Old chart reviewed, Notes from prior ED visits, and [place] Controlled Substance Database       *[name] MCCLENAHAN Sr. was evaluated in Emergency Department on 05/17/2021 for the symptoms described in the history of present illness. He was evaluated in the context of the global COVID-19 pandemic, which necessitated consideration that the patient might be at risk for infection with the SARS-CoV-2 virus that causes COVID-19. Institutional protocols and algorithms that pertain to the evaluation of patients at risk for COVID-19 are in a state of rapid change based on information released by regulatory bodies including the CDC and federal and state organizations. These policies and algorithms were followed during the patient's care in the ED.  Some ED evaluations and interventions may be delayed as a result of limited staffing during the pandemic.*     Medical Decision Making: 65 year old male here with lower cervical and upper thoracic pain after fall on Friday.  He is neurovascularly intact distally.  Is not on blood thinners.  CT of the head, C-spine, and T-spine obtained, reviewed by me and are negative.  Suspect possible rib sprain versus mild paraspinal sprain.  Patient given analgesia and dose of Toradol  and Decadron here.  He is on a pain contract so will hold on any additional meds as an outpatient.  Return precautions given.  No signs of occult cord injury other emergent pathology.  ____________________________________________  FINAL CLINICAL IMPRESSION(S) / ED DIAGNOSES  Final diagnoses:  Cervical pain  Acute midline thoracic back pain  Fall, initial encounter     MEDICATIONS GIVEN DURING THIS  VISIT:  Medications  HYDROmorphone (DILAUDID) injection 2 mg (has no administration in time range)  ketorolac (TORADOL) injection 60 mg (has no administration in time range)  dexamethasone (DECADRON) injection 10 mg (has no administration in time range)     ED Discharge Orders     None        Note:  This document was prepared using Dragon voice recognition software and may include unintentional dictation errors.   Shaune Pollack, MD 05/17/21 2251

## 2021-05-17 NOTE — ED Provider Notes (Signed)
Emergency Medicine Provider Triage Evaluation Note  [name] JOSWICK Sr., a 64 y.o. male  was evaluated in triage.  Pt complains of injuries from a mechanical fall at home. He slipped on his deck steps on Friday, hitting the back of his neck/upper back. He endorses momentary LOC. He notes ongoing upper back/neck pain.  Review of Systems  Positive: Neck/upper back pain Negative: CP, SOB  Physical Exam  BP 109/71   Pulse 70   Temp 98.8 F (37.1 C) (Axillary)   Resp (!) 22   Ht 6\' 3"  (1.905 m)   Wt (!) 172.8 kg   SpO2 94%   BMI 47.62 kg/m  Gen:   Awake, no distress  NAD Resp:  Normal effort CTA MSK:   Moves extremities without difficulty. Decreased neck ROM Other:  CVS: RRR  Medical Decision Making  Medically screening exam initiated at 7:28 PM.  Appropriate orders placed.  Sr. was informed that the remainder of the evaluation will be completed by another provider, this initial triage assessment does not replace that evaluation, and the importance of remaining in the ED until their evaluation is complete.  Patient with ED evaluation of injuries from a mechanical fall.    [place] Lions, PA-C 05/17/21 1931    05/19/21, MD 05/18/21 [phone]

## 2021-05-19 ENCOUNTER — Emergency Department
Admission: EM | Admit: 2021-05-19 | Discharge: 2021-05-19 | Discharge status: Against Medical Advice | Payer: Medicare Other

## 2021-05-26 ENCOUNTER — Ambulatory Visit: Payer: Medicare Other | Admitting: Podiatry

## 2021-06-09 DIAGNOSIS — Z79899 Other long term (current) drug therapy: Secondary | ICD-10-CM | POA: Diagnosis not present

## 2021-06-09 DIAGNOSIS — M5136 Other intervertebral disc degeneration, lumbar region: Secondary | ICD-10-CM | POA: Diagnosis not present

## 2021-06-09 DIAGNOSIS — Z5181 Encounter for therapeutic drug level monitoring: Secondary | ICD-10-CM | POA: Diagnosis not present

## 2021-06-09 DIAGNOSIS — M791 Myalgia, unspecified site: Secondary | ICD-10-CM | POA: Diagnosis not present

## 2021-06-09 DIAGNOSIS — M179 Osteoarthritis of knee, unspecified: Secondary | ICD-10-CM | POA: Diagnosis not present

## 2021-06-09 DIAGNOSIS — G894 Chronic pain syndrome: Secondary | ICD-10-CM | POA: Diagnosis not present

## 2021-06-09 DIAGNOSIS — M47816 Spondylosis without myelopathy or radiculopathy, lumbar region: Secondary | ICD-10-CM | POA: Diagnosis not present

## 2021-06-09 DIAGNOSIS — Z79891 Long term (current) use of opiate analgesic: Secondary | ICD-10-CM | POA: Diagnosis not present

## 2021-06-12 ENCOUNTER — Other Ambulatory Visit: Payer: Self-pay

## 2021-06-12 ENCOUNTER — Emergency Department: Payer: Medicare Other

## 2021-06-12 DIAGNOSIS — Z5321 Procedure and treatment not carried out due to patient leaving prior to being seen by health care provider: Secondary | ICD-10-CM | POA: Insufficient documentation

## 2021-06-12 DIAGNOSIS — R079 Chest pain, unspecified: Secondary | ICD-10-CM | POA: Diagnosis not present

## 2021-06-12 DIAGNOSIS — R110 Nausea: Secondary | ICD-10-CM | POA: Diagnosis not present

## 2021-06-12 DIAGNOSIS — R0602 Shortness of breath: Secondary | ICD-10-CM | POA: Insufficient documentation

## 2021-06-12 LAB — CBC
HCT: 39.7 % (ref 39.0–52.0)
Hemoglobin: 13.6 g/dL (ref 13.0–17.0)
MCH: 32.5 pg (ref 26.0–34.0)
MCHC: 34.3 g/dL (ref 30.0–36.0)
MCV: 95 fL (ref 80.0–100.0)
Platelets: 244 10*3/uL (ref 150–400)
RBC: 4.18 MIL/uL — ABNORMAL LOW (ref 4.22–5.81)
RDW: 12.5 % (ref 11.5–15.5)
WBC: 9.3 10*3/uL (ref 4.0–10.5)
nRBC: 0 % (ref 0.0–0.2)

## 2021-06-12 LAB — BASIC METABOLIC PANEL
Anion gap: 8 (ref 5–15)
BUN: 15 mg/dL (ref 8–23)
CO2: 24 mmol/L (ref 22–32)
Calcium: 8.9 mg/dL (ref 8.9–10.3)
Chloride: 107 mmol/L (ref 98–111)
Creatinine, Ser: 1.29 mg/dL — ABNORMAL HIGH (ref 0.61–1.24)
GFR, Estimated: >60 mL/min (ref >60)
Glucose, Bld: 124 mg/dL — ABNORMAL HIGH (ref 70–99)
Potassium: 3.7 mmol/L (ref 3.5–5.1)
Sodium: 139 mmol/L (ref 135–145)

## 2021-06-12 LAB — TROPONIN I (HIGH SENSITIVITY): Troponin I (High Sensitivity): 4 ng/L (ref <18)

## 2021-06-12 NOTE — ED Triage Notes (Signed)
Pt states that he developed chest pain and shortness of breath last night that feels similar to when he had a heart attack prior.

## 2021-06-13 ENCOUNTER — Emergency Department
Admission: EM | Admit: 2021-06-13 | Discharge: 2021-06-13 | Disposition: A | Discharge status: Home / Self Care | Payer: Medicare Other | Attending: Emergency Medicine | Admitting: Emergency Medicine

## 2021-06-13 NOTE — ED Notes (Signed)
No answer when called for repeat trop.

## 2021-06-13 NOTE — ED Notes (Signed)
No answer when called for repeat vitals.

## 2021-06-13 NOTE — ED Notes (Signed)
No answer in lobby when called for repeat vital signs 

## 2021-06-18 DIAGNOSIS — Z122 Encounter for screening for malignant neoplasm of respiratory organs: Secondary | ICD-10-CM | POA: Diagnosis not present

## 2021-06-18 DIAGNOSIS — Z87891 Personal history of nicotine dependence: Secondary | ICD-10-CM | POA: Diagnosis not present

## 2021-06-19 DIAGNOSIS — Z122 Encounter for screening for malignant neoplasm of respiratory organs: Secondary | ICD-10-CM | POA: Diagnosis not present

## 2021-06-19 DIAGNOSIS — R918 Other nonspecific abnormal finding of lung field: Secondary | ICD-10-CM | POA: Diagnosis not present

## 2021-06-19 DIAGNOSIS — Z87891 Personal history of nicotine dependence: Secondary | ICD-10-CM | POA: Diagnosis not present

## 2021-06-28 DIAGNOSIS — M65312 Trigger thumb, left thumb: Secondary | ICD-10-CM | POA: Diagnosis not present

## 2021-07-09 ENCOUNTER — Emergency Department: Payer: Medicare Other

## 2021-07-09 ENCOUNTER — Other Ambulatory Visit: Payer: Self-pay

## 2021-07-09 DIAGNOSIS — Z5321 Procedure and treatment not carried out due to patient leaving prior to being seen by health care provider: Secondary | ICD-10-CM | POA: Insufficient documentation

## 2021-07-09 DIAGNOSIS — R079 Chest pain, unspecified: Secondary | ICD-10-CM | POA: Diagnosis not present

## 2021-07-09 DIAGNOSIS — R110 Nausea: Secondary | ICD-10-CM | POA: Insufficient documentation

## 2021-07-09 DIAGNOSIS — R0602 Shortness of breath: Secondary | ICD-10-CM | POA: Diagnosis not present

## 2021-07-09 LAB — BASIC METABOLIC PANEL
Anion gap: 10 (ref 5–15)
BUN: 24 mg/dL — ABNORMAL HIGH (ref 8–23)
CO2: 21 mmol/L — ABNORMAL LOW (ref 22–32)
Calcium: 8.9 mg/dL (ref 8.9–10.3)
Chloride: 106 mmol/L (ref 98–111)
Creatinine, Ser: 1.2 mg/dL (ref 0.61–1.24)
GFR, Estimated: >60 mL/min (ref >60)
Glucose, Bld: 119 mg/dL — ABNORMAL HIGH (ref 70–99)
Potassium: 3.9 mmol/L (ref 3.5–5.1)
Sodium: 137 mmol/L (ref 135–145)

## 2021-07-09 LAB — CBC
HCT: 42 % (ref 39.0–52.0)
Hemoglobin: 14 g/dL (ref 13.0–17.0)
MCH: 32.6 pg (ref 26.0–34.0)
MCHC: 33.3 g/dL (ref 30.0–36.0)
MCV: 97.7 fL (ref 80.0–100.0)
Platelets: 228 10*3/uL (ref 150–400)
RBC: 4.3 MIL/uL (ref 4.22–5.81)
RDW: 12.9 % (ref 11.5–15.5)
WBC: 11.8 10*3/uL — ABNORMAL HIGH (ref 4.0–10.5)
nRBC: 0 % (ref 0.0–0.2)

## 2021-07-09 LAB — TROPONIN I (HIGH SENSITIVITY): Troponin I (High Sensitivity): 3 ng/L (ref <18)

## 2021-07-09 NOTE — ED Triage Notes (Signed)
Pt has left side chest pain and sob.  Sx began today.  Pt reports nausea. Pain radiates into left arm and neck  No cough.  Pt alert  speech clear.

## 2021-07-10 ENCOUNTER — Emergency Department
Admission: EM | Admit: 2021-07-10 | Discharge: 2021-07-10 | Disposition: A | Discharge status: Home / Self Care | Payer: Medicare Other | Attending: Emergency Medicine | Admitting: Emergency Medicine

## 2021-08-13 DIAGNOSIS — Z87891 Personal history of nicotine dependence: Secondary | ICD-10-CM | POA: Diagnosis not present

## 2021-08-13 DIAGNOSIS — G4709 Other insomnia: Secondary | ICD-10-CM | POA: Diagnosis not present

## 2021-08-13 DIAGNOSIS — J449 Chronic obstructive pulmonary disease, unspecified: Secondary | ICD-10-CM | POA: Diagnosis not present

## 2021-08-13 DIAGNOSIS — R7303 Prediabetes: Secondary | ICD-10-CM | POA: Diagnosis not present

## 2021-08-13 DIAGNOSIS — I5032 Chronic diastolic (congestive) heart failure: Secondary | ICD-10-CM | POA: Diagnosis not present

## 2021-08-13 DIAGNOSIS — R1319 Other dysphagia: Secondary | ICD-10-CM | POA: Diagnosis not present

## 2021-08-13 DIAGNOSIS — I110 Hypertensive heart disease with heart failure: Secondary | ICD-10-CM | POA: Diagnosis not present

## 2021-08-13 DIAGNOSIS — Z23 Encounter for immunization: Secondary | ICD-10-CM | POA: Diagnosis not present

## 2021-08-13 DIAGNOSIS — Z79899 Other long term (current) drug therapy: Secondary | ICD-10-CM | POA: Diagnosis not present

## 2021-08-13 DIAGNOSIS — K222 Esophageal obstruction: Secondary | ICD-10-CM | POA: Diagnosis not present

## 2021-08-13 DIAGNOSIS — I2510 Atherosclerotic heart disease of native coronary artery without angina pectoris: Secondary | ICD-10-CM | POA: Diagnosis not present

## 2021-08-13 DIAGNOSIS — G4700 Insomnia, unspecified: Secondary | ICD-10-CM | POA: Diagnosis not present

## 2021-08-28 DIAGNOSIS — H905 Unspecified sensorineural hearing loss: Secondary | ICD-10-CM | POA: Diagnosis not present

## 2021-09-03 DIAGNOSIS — M179 Osteoarthritis of knee, unspecified: Secondary | ICD-10-CM | POA: Diagnosis not present

## 2021-09-03 DIAGNOSIS — M5136 Other intervertebral disc degeneration, lumbar region: Secondary | ICD-10-CM | POA: Diagnosis not present

## 2021-09-03 DIAGNOSIS — Z79899 Other long term (current) drug therapy: Secondary | ICD-10-CM | POA: Diagnosis not present

## 2021-09-03 DIAGNOSIS — M791 Myalgia, unspecified site: Secondary | ICD-10-CM | POA: Diagnosis not present

## 2021-09-03 DIAGNOSIS — G894 Chronic pain syndrome: Secondary | ICD-10-CM | POA: Diagnosis not present

## 2021-09-03 DIAGNOSIS — M47816 Spondylosis without myelopathy or radiculopathy, lumbar region: Secondary | ICD-10-CM | POA: Diagnosis not present

## 2021-11-02 DIAGNOSIS — Z87891 Personal history of nicotine dependence: Secondary | ICD-10-CM | POA: Diagnosis not present

## 2021-11-02 DIAGNOSIS — G4700 Insomnia, unspecified: Secondary | ICD-10-CM | POA: Diagnosis not present

## 2021-11-02 DIAGNOSIS — Z0000 Encounter for general adult medical examination without abnormal findings: Secondary | ICD-10-CM | POA: Diagnosis not present

## 2021-11-02 DIAGNOSIS — I25119 Atherosclerotic heart disease of native coronary artery with unspecified angina pectoris: Secondary | ICD-10-CM | POA: Diagnosis not present

## 2021-11-02 DIAGNOSIS — J449 Chronic obstructive pulmonary disease, unspecified: Secondary | ICD-10-CM | POA: Diagnosis not present

## 2021-11-02 DIAGNOSIS — E7800 Pure hypercholesterolemia, unspecified: Secondary | ICD-10-CM | POA: Diagnosis not present

## 2021-11-02 DIAGNOSIS — G4709 Other insomnia: Secondary | ICD-10-CM | POA: Diagnosis not present

## 2021-11-02 DIAGNOSIS — I5032 Chronic diastolic (congestive) heart failure: Secondary | ICD-10-CM | POA: Diagnosis not present

## 2021-11-02 DIAGNOSIS — M150 Primary generalized (osteo)arthritis: Secondary | ICD-10-CM | POA: Diagnosis not present

## 2021-12-01 DIAGNOSIS — G894 Chronic pain syndrome: Secondary | ICD-10-CM | POA: Diagnosis not present

## 2021-12-01 DIAGNOSIS — M5136 Other intervertebral disc degeneration, lumbar region: Secondary | ICD-10-CM | POA: Diagnosis not present

## 2021-12-01 DIAGNOSIS — M47816 Spondylosis without myelopathy or radiculopathy, lumbar region: Secondary | ICD-10-CM | POA: Diagnosis not present

## 2021-12-01 DIAGNOSIS — M170 Bilateral primary osteoarthritis of knee: Secondary | ICD-10-CM | POA: Diagnosis not present

## 2021-12-01 DIAGNOSIS — Z79899 Other long term (current) drug therapy: Secondary | ICD-10-CM | POA: Diagnosis not present

## 2021-12-01 DIAGNOSIS — Z79891 Long term (current) use of opiate analgesic: Secondary | ICD-10-CM | POA: Diagnosis not present

## 2021-12-01 DIAGNOSIS — M791 Myalgia, unspecified site: Secondary | ICD-10-CM | POA: Diagnosis not present

## 2021-12-01 IMAGING — CR DG CHEST 2V
1 series · 2 of 2 positions shown · non-contrast
Comparison: 06/12/2021

CLINICAL DATA: Left-sided chest pain and shortness of breath

EXAM:
CHEST - 2 VIEW

[Series 1: dg chest 2 view · 0.14mm/px · 2 of 2 slices shown]
[im 1/2]
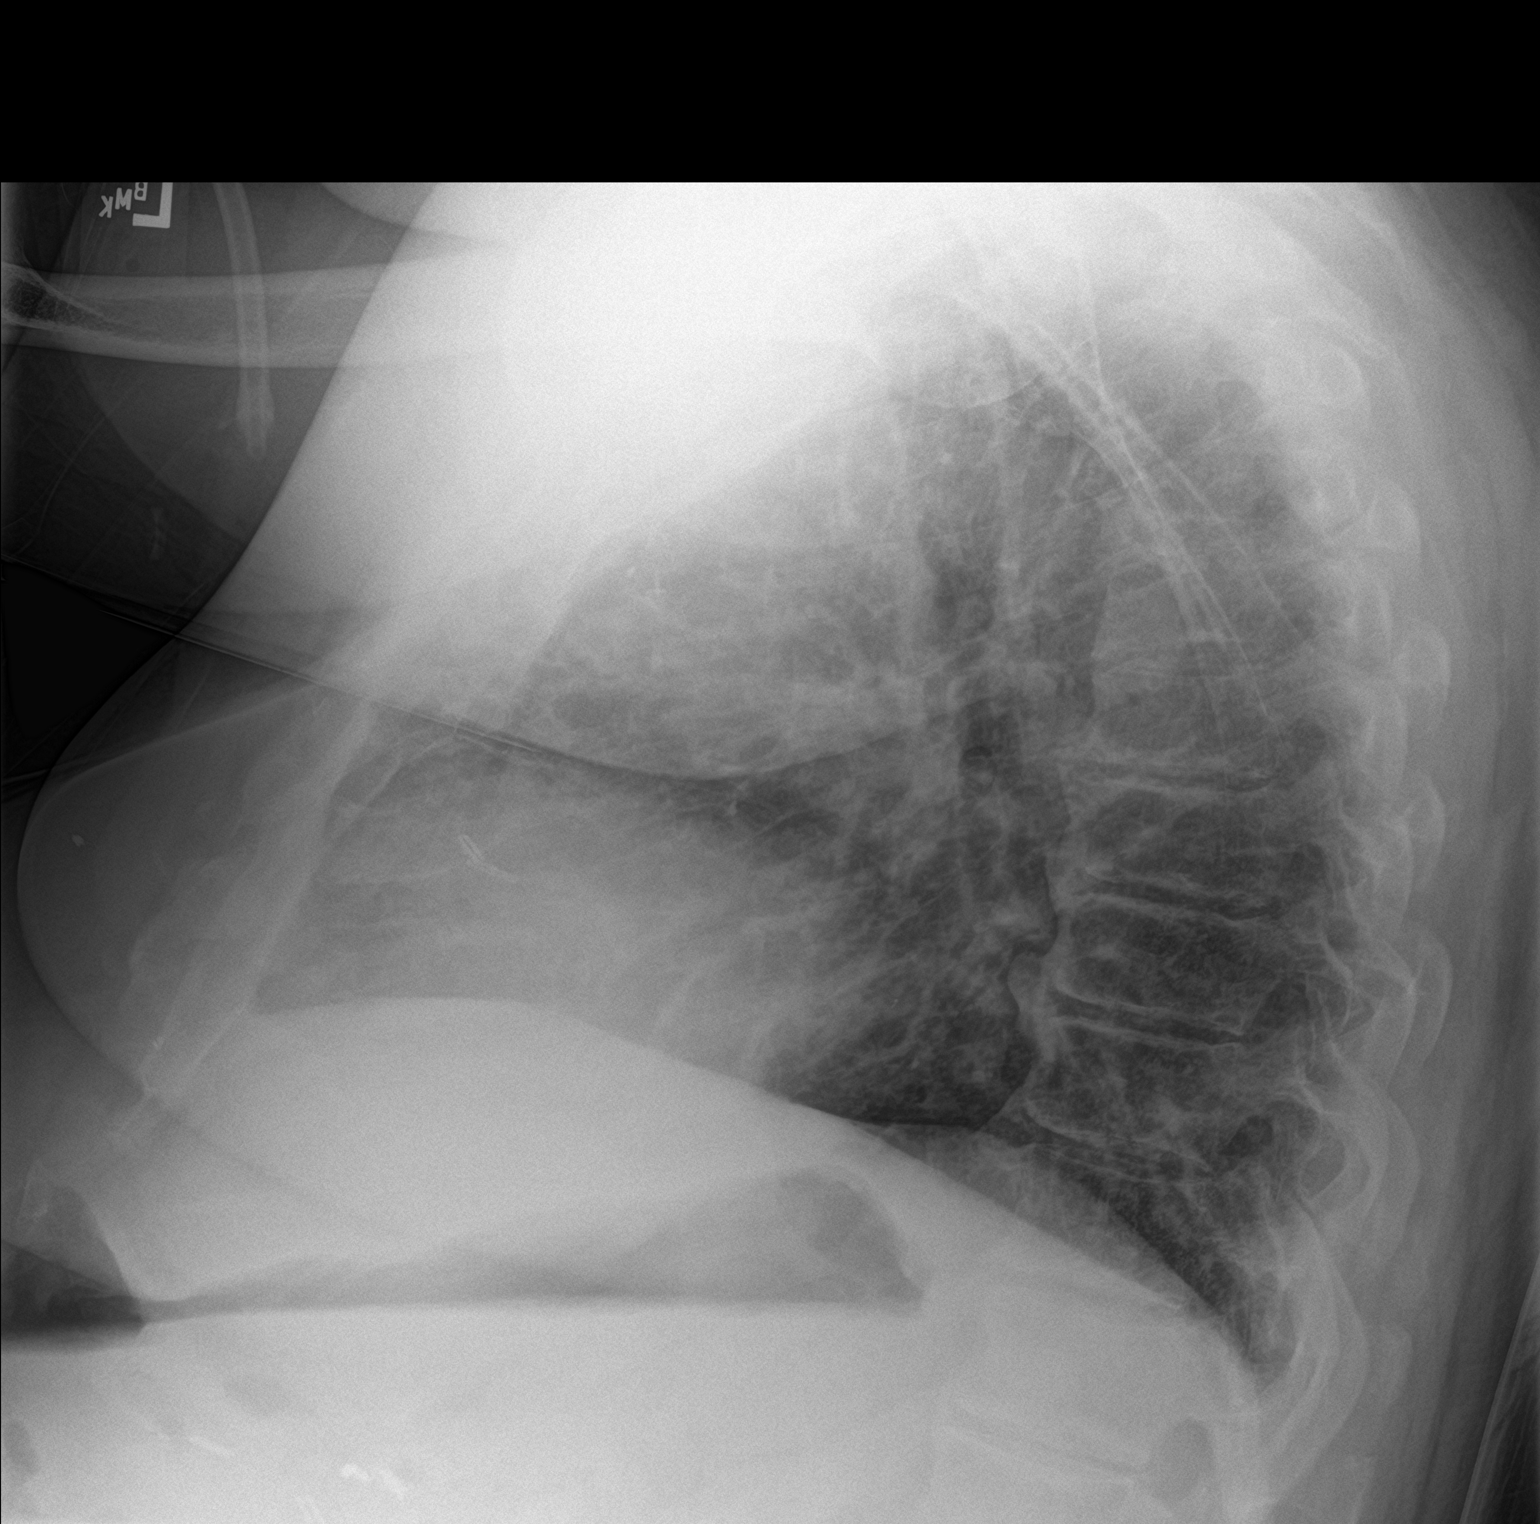
[im 2/2]
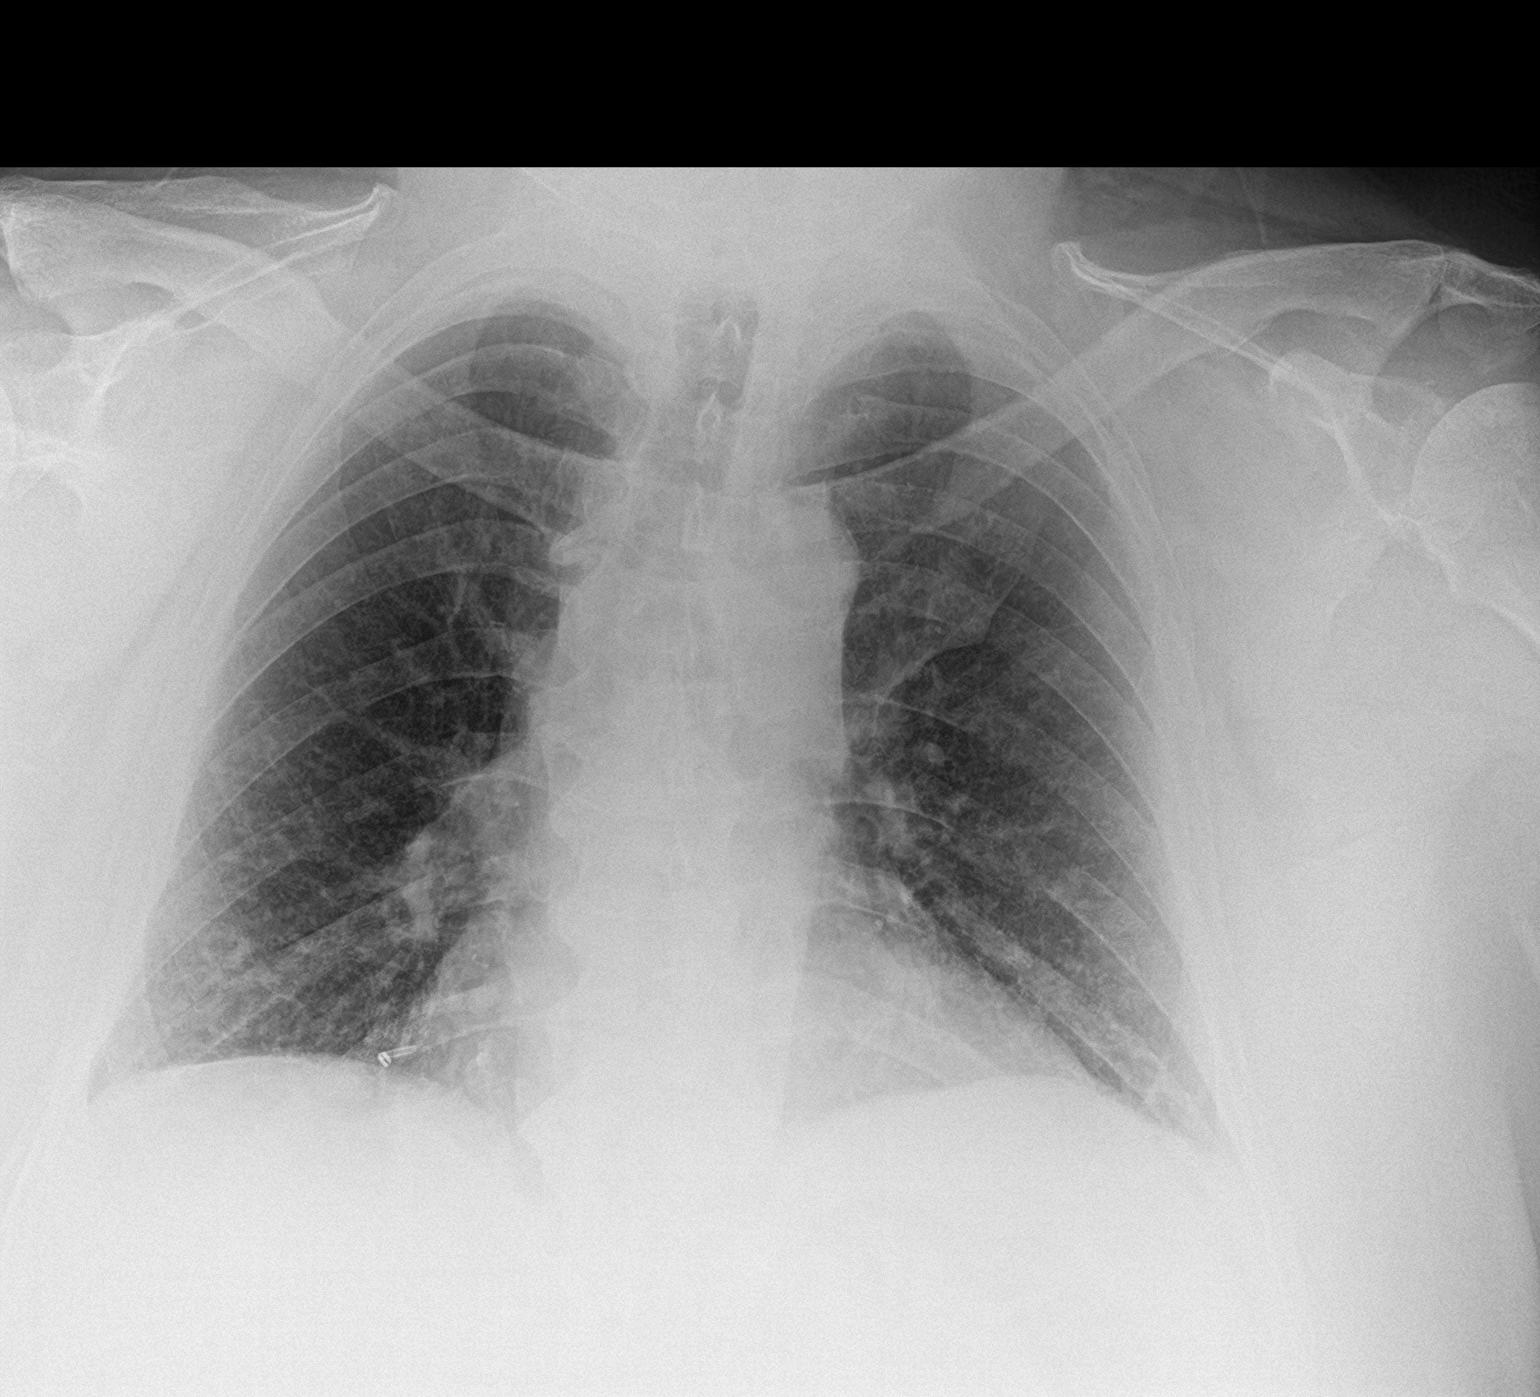

[2 of 2 positions shown; findings below may reference images not displayed]

FINDINGS: The heart size and mediastinal contours are within normal limits.
Both lungs are clear. The visualized skeletal structures are
unremarkable.
IMPRESSION: No active cardiopulmonary disease.

## 2022-01-14 ENCOUNTER — Ambulatory Visit: Payer: Medicare Other | Admitting: Podiatry

## 2022-02-18 DIAGNOSIS — I700 Atherosclerosis of aorta: Secondary | ICD-10-CM | POA: Diagnosis not present

## 2022-02-18 DIAGNOSIS — I10 Essential (primary) hypertension: Secondary | ICD-10-CM | POA: Diagnosis not present

## 2022-02-18 DIAGNOSIS — J449 Chronic obstructive pulmonary disease, unspecified: Secondary | ICD-10-CM | POA: Diagnosis not present

## 2022-02-18 DIAGNOSIS — E782 Mixed hyperlipidemia: Secondary | ICD-10-CM | POA: Diagnosis not present

## 2022-02-18 DIAGNOSIS — K219 Gastro-esophageal reflux disease without esophagitis: Secondary | ICD-10-CM | POA: Diagnosis not present

## 2022-02-18 DIAGNOSIS — F32A Depression, unspecified: Secondary | ICD-10-CM | POA: Diagnosis not present

## 2022-02-19 DIAGNOSIS — K219 Gastro-esophageal reflux disease without esophagitis: Secondary | ICD-10-CM | POA: Diagnosis not present

## 2022-02-19 DIAGNOSIS — F32A Depression, unspecified: Secondary | ICD-10-CM | POA: Diagnosis not present

## 2022-02-19 DIAGNOSIS — J449 Chronic obstructive pulmonary disease, unspecified: Secondary | ICD-10-CM | POA: Diagnosis not present

## 2022-02-19 DIAGNOSIS — I10 Essential (primary) hypertension: Secondary | ICD-10-CM | POA: Diagnosis not present

## 2022-02-19 DIAGNOSIS — R7303 Prediabetes: Secondary | ICD-10-CM | POA: Diagnosis not present

## 2022-02-25 DIAGNOSIS — G894 Chronic pain syndrome: Secondary | ICD-10-CM | POA: Diagnosis not present

## 2022-02-25 DIAGNOSIS — M5136 Other intervertebral disc degeneration, lumbar region: Secondary | ICD-10-CM | POA: Diagnosis not present

## 2022-02-25 DIAGNOSIS — Z79899 Other long term (current) drug therapy: Secondary | ICD-10-CM | POA: Diagnosis not present

## 2022-02-25 DIAGNOSIS — M47816 Spondylosis without myelopathy or radiculopathy, lumbar region: Secondary | ICD-10-CM | POA: Diagnosis not present

## 2022-02-25 DIAGNOSIS — M791 Myalgia, unspecified site: Secondary | ICD-10-CM | POA: Diagnosis not present

## 2022-03-17 DIAGNOSIS — I5032 Chronic diastolic (congestive) heart failure: Secondary | ICD-10-CM | POA: Diagnosis not present

## 2022-03-17 DIAGNOSIS — J438 Other emphysema: Secondary | ICD-10-CM | POA: Diagnosis not present

## 2022-03-17 DIAGNOSIS — J439 Emphysema, unspecified: Secondary | ICD-10-CM | POA: Diagnosis not present

## 2022-03-17 DIAGNOSIS — I10 Essential (primary) hypertension: Secondary | ICD-10-CM | POA: Diagnosis not present

## 2022-03-17 DIAGNOSIS — Z87891 Personal history of nicotine dependence: Secondary | ICD-10-CM | POA: Diagnosis not present

## 2022-03-17 DIAGNOSIS — J449 Chronic obstructive pulmonary disease, unspecified: Secondary | ICD-10-CM | POA: Diagnosis not present

## 2022-03-17 DIAGNOSIS — I110 Hypertensive heart disease with heart failure: Secondary | ICD-10-CM | POA: Diagnosis not present

## 2022-03-17 DIAGNOSIS — R7303 Prediabetes: Secondary | ICD-10-CM | POA: Diagnosis not present

## 2022-04-08 ENCOUNTER — Other Ambulatory Visit: Payer: Self-pay

## 2022-04-08 ENCOUNTER — Emergency Department
Admission: EM | Admit: 2022-04-08 | Discharge: 2022-04-08 | Discharge status: Against Medical Advice | Payer: Medicare Other | Attending: Emergency Medicine | Admitting: Emergency Medicine

## 2022-04-08 ENCOUNTER — Encounter: Payer: Self-pay | Admitting: Emergency Medicine

## 2022-04-08 DIAGNOSIS — M542 Cervicalgia: Secondary | ICD-10-CM | POA: Insufficient documentation

## 2022-04-08 DIAGNOSIS — R519 Headache, unspecified: Secondary | ICD-10-CM | POA: Diagnosis not present

## 2022-04-08 DIAGNOSIS — Z5321 Procedure and treatment not carried out due to patient leaving prior to being seen by health care provider: Secondary | ICD-10-CM | POA: Diagnosis not present

## 2022-04-08 NOTE — ED Triage Notes (Signed)
Patient ambulatory to triage with steady gait, without difficulty or distress noted; pt reports that he awoke this morning with tender knot to left side posterior neck; causing pain that is shooting up into his head and causing a headache

## 2022-04-14 ENCOUNTER — Emergency Department: Payer: Medicare Other

## 2022-04-14 ENCOUNTER — Encounter: Payer: Self-pay | Admitting: Emergency Medicine

## 2022-04-14 DIAGNOSIS — Z5321 Procedure and treatment not carried out due to patient leaving prior to being seen by health care provider: Secondary | ICD-10-CM | POA: Diagnosis not present

## 2022-04-14 DIAGNOSIS — R079 Chest pain, unspecified: Secondary | ICD-10-CM | POA: Diagnosis not present

## 2022-04-14 DIAGNOSIS — R0602 Shortness of breath: Secondary | ICD-10-CM | POA: Diagnosis not present

## 2022-04-14 LAB — CBC
HCT: 40.6 % (ref 39.0–52.0)
Hemoglobin: 13.8 g/dL (ref 13.0–17.0)
MCH: 31.9 pg (ref 26.0–34.0)
MCHC: 34 g/dL (ref 30.0–36.0)
MCV: 94 fL (ref 80.0–100.0)
Platelets: 246 10*3/uL (ref 150–400)
RBC: 4.32 MIL/uL (ref 4.22–5.81)
RDW: 12 % (ref 11.5–15.5)
WBC: 10.4 10*3/uL (ref 4.0–10.5)
nRBC: 0 % (ref 0.0–0.2)

## 2022-04-14 LAB — BASIC METABOLIC PANEL
Anion gap: 8 (ref 5–15)
BUN: 14 mg/dL (ref 8–23)
CO2: 22 mmol/L (ref 22–32)
Calcium: 9.1 mg/dL (ref 8.9–10.3)
Chloride: 109 mmol/L (ref 98–111)
Creatinine, Ser: 1.15 mg/dL (ref 0.61–1.24)
GFR, Estimated: >60 mL/min (ref >60)
Glucose, Bld: 151 mg/dL — ABNORMAL HIGH (ref 70–99)
Potassium: 3.7 mmol/L (ref 3.5–5.1)
Sodium: 139 mmol/L (ref 135–145)

## 2022-04-14 LAB — TROPONIN I (HIGH SENSITIVITY): Troponin I (High Sensitivity): 4 ng/L (ref <18)

## 2022-04-14 LAB — D-DIMER, QUANTITATIVE: D-Dimer, Quant: 0.43 ug/mL-FEU (ref 0.00–0.50)

## 2022-04-14 NOTE — ED Triage Notes (Signed)
Pt presents via POV with complaints of left sided CP with radiation to the left arm that started this afternoon. Pt has a significant cardiac hx with last MI being 5 years ago. Pt endorses associated SOB due to the pain and pressure in his chest. Pt states he felt warm today but did not obtain a temp because he "hurting to bad".

## 2022-04-15 ENCOUNTER — Emergency Department
Admission: EM | Admit: 2022-04-15 | Discharge: 2022-04-15 | Discharge status: Against Medical Advice | Payer: Medicare Other | Attending: Student in an Organized Health Care Education/Training Program | Admitting: Student in an Organized Health Care Education/Training Program

## 2022-04-15 NOTE — ED Notes (Signed)
Pt to STAT desk stating he is leaving and will go elsewhere.  Pt ambulated out of ED lobby

## 2022-05-13 DIAGNOSIS — Z79899 Other long term (current) drug therapy: Secondary | ICD-10-CM | POA: Diagnosis not present

## 2022-05-13 DIAGNOSIS — G894 Chronic pain syndrome: Secondary | ICD-10-CM | POA: Diagnosis not present

## 2022-05-13 DIAGNOSIS — M791 Myalgia, unspecified site: Secondary | ICD-10-CM | POA: Diagnosis not present

## 2022-05-13 DIAGNOSIS — M5136 Other intervertebral disc degeneration, lumbar region: Secondary | ICD-10-CM | POA: Diagnosis not present

## 2022-05-13 DIAGNOSIS — M47816 Spondylosis without myelopathy or radiculopathy, lumbar region: Secondary | ICD-10-CM | POA: Diagnosis not present

## 2022-07-12 DIAGNOSIS — G4700 Insomnia, unspecified: Secondary | ICD-10-CM | POA: Diagnosis not present

## 2022-07-12 DIAGNOSIS — I110 Hypertensive heart disease with heart failure: Secondary | ICD-10-CM | POA: Diagnosis not present

## 2022-07-12 DIAGNOSIS — Z23 Encounter for immunization: Secondary | ICD-10-CM | POA: Diagnosis not present

## 2022-07-12 DIAGNOSIS — J4489 Other specified chronic obstructive pulmonary disease: Secondary | ICD-10-CM | POA: Diagnosis not present

## 2022-07-12 DIAGNOSIS — E7800 Pure hypercholesterolemia, unspecified: Secondary | ICD-10-CM | POA: Diagnosis not present

## 2022-07-12 DIAGNOSIS — I5032 Chronic diastolic (congestive) heart failure: Secondary | ICD-10-CM | POA: Diagnosis not present

## 2022-07-12 DIAGNOSIS — G4709 Other insomnia: Secondary | ICD-10-CM | POA: Diagnosis not present

## 2022-07-12 DIAGNOSIS — Z87891 Personal history of nicotine dependence: Secondary | ICD-10-CM | POA: Diagnosis not present

## 2022-07-12 DIAGNOSIS — G4733 Obstructive sleep apnea (adult) (pediatric): Secondary | ICD-10-CM | POA: Diagnosis not present

## 2022-07-12 DIAGNOSIS — I2510 Atherosclerotic heart disease of native coronary artery without angina pectoris: Secondary | ICD-10-CM | POA: Diagnosis not present

## 2022-07-12 DIAGNOSIS — J438 Other emphysema: Secondary | ICD-10-CM | POA: Diagnosis not present

## 2022-07-12 DIAGNOSIS — R7303 Prediabetes: Secondary | ICD-10-CM | POA: Diagnosis not present

## 2022-07-28 DIAGNOSIS — G894 Chronic pain syndrome: Secondary | ICD-10-CM | POA: Diagnosis not present

## 2022-07-28 DIAGNOSIS — M791 Myalgia, unspecified site: Secondary | ICD-10-CM | POA: Diagnosis not present

## 2022-07-28 DIAGNOSIS — M47816 Spondylosis without myelopathy or radiculopathy, lumbar region: Secondary | ICD-10-CM | POA: Diagnosis not present

## 2022-07-28 DIAGNOSIS — Z79899 Other long term (current) drug therapy: Secondary | ICD-10-CM | POA: Diagnosis not present

## 2022-07-28 DIAGNOSIS — M5136 Other intervertebral disc degeneration, lumbar region: Secondary | ICD-10-CM | POA: Diagnosis not present

## 2022-08-26 ENCOUNTER — Emergency Department: Payer: Medicare Other

## 2022-08-26 ENCOUNTER — Observation Stay
Admission: EM | Admit: 2022-08-26 | Discharge: 2022-08-27 | Disposition: A | Discharge status: Home / Self Care | Payer: Medicare Other | Attending: Internal Medicine | Admitting: Internal Medicine

## 2022-08-26 ENCOUNTER — Other Ambulatory Visit: Payer: Self-pay

## 2022-08-26 DIAGNOSIS — I110 Hypertensive heart disease with heart failure: Secondary | ICD-10-CM | POA: Diagnosis not present

## 2022-08-26 DIAGNOSIS — Z87891 Personal history of nicotine dependence: Secondary | ICD-10-CM | POA: Diagnosis not present

## 2022-08-26 DIAGNOSIS — R0789 Other chest pain: Secondary | ICD-10-CM | POA: Diagnosis present

## 2022-08-26 DIAGNOSIS — Z951 Presence of aortocoronary bypass graft: Secondary | ICD-10-CM | POA: Diagnosis not present

## 2022-08-26 DIAGNOSIS — Z7952 Long term (current) use of systemic steroids: Secondary | ICD-10-CM | POA: Diagnosis not present

## 2022-08-26 DIAGNOSIS — I10 Essential (primary) hypertension: Secondary | ICD-10-CM | POA: Diagnosis present

## 2022-08-26 DIAGNOSIS — I2510 Atherosclerotic heart disease of native coronary artery without angina pectoris: Secondary | ICD-10-CM | POA: Insufficient documentation

## 2022-08-26 DIAGNOSIS — H905 Unspecified sensorineural hearing loss: Secondary | ICD-10-CM | POA: Diagnosis not present

## 2022-08-26 DIAGNOSIS — Z955 Presence of coronary angioplasty implant and graft: Secondary | ICD-10-CM | POA: Insufficient documentation

## 2022-08-26 DIAGNOSIS — Z7982 Long term (current) use of aspirin: Secondary | ICD-10-CM | POA: Insufficient documentation

## 2022-08-26 DIAGNOSIS — I5032 Chronic diastolic (congestive) heart failure: Secondary | ICD-10-CM | POA: Insufficient documentation

## 2022-08-26 DIAGNOSIS — R079 Chest pain, unspecified: Secondary | ICD-10-CM | POA: Diagnosis not present

## 2022-08-26 DIAGNOSIS — Z79899 Other long term (current) drug therapy: Secondary | ICD-10-CM | POA: Insufficient documentation

## 2022-08-26 DIAGNOSIS — I200 Unstable angina: Secondary | ICD-10-CM

## 2022-08-26 DIAGNOSIS — I5030 Unspecified diastolic (congestive) heart failure: Secondary | ICD-10-CM | POA: Diagnosis present

## 2022-08-26 DIAGNOSIS — I25118 Atherosclerotic heart disease of native coronary artery with other forms of angina pectoris: Secondary | ICD-10-CM | POA: Diagnosis present

## 2022-08-26 DIAGNOSIS — G894 Chronic pain syndrome: Secondary | ICD-10-CM

## 2022-08-26 DIAGNOSIS — J449 Chronic obstructive pulmonary disease, unspecified: Secondary | ICD-10-CM | POA: Insufficient documentation

## 2022-08-26 DIAGNOSIS — I25110 Atherosclerotic heart disease of native coronary artery with unstable angina pectoris: Secondary | ICD-10-CM | POA: Diagnosis not present

## 2022-08-26 DIAGNOSIS — Z7984 Long term (current) use of oral hypoglycemic drugs: Secondary | ICD-10-CM | POA: Insufficient documentation

## 2022-08-26 DIAGNOSIS — Z96642 Presence of left artificial hip joint: Secondary | ICD-10-CM | POA: Diagnosis not present

## 2022-08-26 HISTORY — DX: Mediastinitis: J98.51

## 2022-08-26 HISTORY — DX: Chronic diastolic (congestive) heart failure: I50.32

## 2022-08-26 LAB — CBC
HCT: 42 % (ref 39.0–52.0)
Hemoglobin: 13.8 g/dL (ref 13.0–17.0)
MCH: 30.7 pg (ref 26.0–34.0)
MCHC: 32.9 g/dL (ref 30.0–36.0)
MCV: 93.3 fL (ref 80.0–100.0)
Platelets: 255 10*3/uL (ref 150–400)
RBC: 4.5 MIL/uL (ref 4.22–5.81)
RDW: 12.2 % (ref 11.5–15.5)
WBC: 9.1 10*3/uL (ref 4.0–10.5)
nRBC: 0 % (ref 0.0–0.2)

## 2022-08-26 LAB — TROPONIN I (HIGH SENSITIVITY)
Troponin I (High Sensitivity): 4 ng/L (ref <18)
Troponin I (High Sensitivity): 4 ng/L (ref <18)

## 2022-08-26 LAB — BASIC METABOLIC PANEL
Anion gap: 12 (ref 5–15)
BUN: 12 mg/dL (ref 8–23)
CO2: 21 mmol/L — ABNORMAL LOW (ref 22–32)
Calcium: 8.9 mg/dL (ref 8.9–10.3)
Chloride: 103 mmol/L (ref 98–111)
Creatinine, Ser: 1.15 mg/dL (ref 0.61–1.24)
GFR, Estimated: >60 mL/min (ref >60)
Glucose, Bld: 129 mg/dL — ABNORMAL HIGH (ref 70–99)
Potassium: 4 mmol/L (ref 3.5–5.1)
Sodium: 136 mmol/L (ref 135–145)

## 2022-08-26 MED ORDER — ONDANSETRON HCL 4 MG/2ML IJ SOLN
4.0000 mg | Freq: Once | INTRAMUSCULAR | Status: AC
  Administered 2022-08-27: 4 mg via INTRAVENOUS
  Filled 2022-08-26: qty 2

## 2022-08-26 MED ORDER — ASPIRIN 81 MG PO CHEW
324.0000 mg | CHEWABLE_TABLET | Freq: Once | ORAL | Status: AC
  Administered 2022-08-27: 81 mg via ORAL
  Filled 2022-08-26: qty 4

## 2022-08-26 MED ORDER — MORPHINE SULFATE (PF) 4 MG/ML IV SOLN
4.0000 mg | Freq: Once | INTRAVENOUS | Status: AC
  Administered 2022-08-27: 4 mg via INTRAVENOUS
  Filled 2022-08-26: qty 1

## 2022-08-26 NOTE — ED Provider Notes (Signed)
[facility] Provider Note    Event Date/Time   First MD Initiated Contact with Patient 08/26/22 2327     (approximate)   History   Chest Pain   HPI  [name] TUBERVILLE Sr. is a 67 y.o. male with history of coronary artery disease status post CABG, stents, CHF, COPD, history of substance abuse, hypertension, hyperlipidemia who presents to the emergency department with complaints of central chest pain that feels like a pressure, tightness that started this morning at rest.  Has had shortness of breath, nausea, diaphoresis, dizziness.  No aggravating or alleviating factors other than some improvement after 3 baby aspirin at home and 2 nitroglycerin tablets.  Still having discomfort.  States this feels similar to his anginal equivalent and when he had to have a CABG about 5 years ago at [facility]   History provided by patient.    Past Medical History:  Diagnosis Date   Anxiety disorder    Arthritis    CAD (coronary artery disease)    a. ~ 2000 cath at [facility];  b. 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   CHF (congestive heart failure) (HCC)    Chronic back pain    Chronic hip pain    COPD (chronic obstructive pulmonary disease) (HCC)    Depression    Essential hypertension, benign    GERD (gastroesophageal reflux disease)    Headache(784.0)    History of alcohol abuse    History of cocaine abuse (HCC)    Hyperlipidemia    Obesity    Pneumonia     Past Surgical History:  Procedure Laterality Date   BACK SURGERY     CHOLECYSTECTOMY     CORONARY ARTERY BYPASS GRAFT     HIP SURGERY     rt   JOINT REPLACEMENT     LEFT HEART CATHETERIZATION WITH CORONARY ANGIOGRAM N/A 09/12/2013   Procedure: LEFT HEART CATHETERIZATION WITH CORONARY ANGIOGRAM;  Surgeon: Kathleene Hazel, MD;  Location: [facility] CATH LAB;  Service:  Cardiovascular;  Laterality: N/A;   NASAL/SINUS ENDOSCOPY     TOTAL HIP ARTHROPLASTY  05/08/2012   Procedure: TOTAL HIP ARTHROPLASTY;  Surgeon: Nestor Lewandowsky, MD;  Location: [facility] OR;  Service: Orthopedics;  Laterality: Left;   TOTAL HIP ARTHROPLASTY  2014 per pt    MEDICATIONS:  Prior to Admission medications   Medication Sig Start Date End Date Taking? Authorizing Provider  albuterol (PROVENTIL) (2.5 MG/3ML) 0.083% nebulizer solution Take 3 mLs (2.5 mg total) by nebulization every 6 (six) hours as needed for wheezing or shortness of breath. 10/22/20   Lurene Shadow, MD  aspirin 81 MG tablet Take 1 tablet (81 mg total) by mouth daily. 05/31/12   Kathlen Mody, MD  budesonide-formoterol (SYMBICORT) 160-4.5 MCG/ACT inhaler Inhale 2 puffs into the lungs 2 (two) times daily.    [provider]  buPROPion (WELLBUTRIN XL) 150 MG 24 hr tablet Take 300 mg by mouth daily.    [provider]  citalopram (CELEXA) 10 MG tablet Take 10 mg by mouth daily. 02/29/20   [provider]  [employer] MELATONIN 3 MG TABS Take 1 tablet by mouth at bedtime.  03/22/18   [provider]  fluticasone (FLONASE) 50 MCG/ACT nasal spray Place 2 sprays into both nostrils 2 (two) times daily. 12/04/19   [provider]  furosemide (LASIX) 20 MG tablet Take 40 mg by mouth every morning.     [provider]  HYDROcodone-homatropine (HYCODAN) 5-1.5 MG/5ML syrup Take 5 mLs by mouth every 6 (six) hours as needed for cough. 10/22/20   Lurene Shadow, MD  hydrOXYzine (ATARAX/VISTARIL) 25 MG tablet Take 50 mg by mouth at bedtime. 02/10/20   [provider]  isosorbide mononitrate (IMDUR) 60 MG 24 hr tablet Take 60 mg by mouth daily. 06/22/18   [provider]  metFORMIN (GLUCOPHAGE-XR) 500 MG 24 hr tablet Take 500 mg by mouth daily before supper. 09/26/20   [provider]  mirtazapine (REMERON) 15 MG tablet Take 15 mg by mouth at bedtime. 12/21/19   [provider]   nitroGLYCERIN (NITROSTAT) 0.4 MG SL tablet Place 0.4 mg under the tongue every 5 (five) minutes as needed. For chest pain    [provider]  omeprazole (PRILOSEC) 20 MG capsule Take 20 mg by mouth every morning.     [provider]  oxyCODONE-acetaminophen (PERCOCET) 10-325 MG tablet Take 1 tablet by mouth every 4 (four) hours as needed for pain.    [provider]  PROAIR HFA 108 (90 BASE) MCG/ACT inhaler Inhale 2 puffs into the lungs every 4 (four) hours as needed for wheezing or shortness of breath.    [provider]  REPATHA SURECLICK 140 MG/ML SOAJ Inject 140 mg into the skin every 14 (fourteen) days.    [provider]  triamcinolone ointment (KENALOG) 0.1 % Apply 1 application topically 2 (two) times daily. 09/25/20   [provider]    Physical Exam   Triage Vital Signs: ED Triage Vitals  Enc Vitals Group     BP 08/26/22 2108 132/88     Pulse Rate 08/26/22 1901 70     Resp 08/26/22 1901 20     Temp 08/26/22 1901 98.3 F (36.8 C)     Temp Source 08/26/22 1901 Oral     SpO2 08/26/22 1901 98 %     Weight --      Height --      Head Circumference --      Peak Flow --      Pain Score 08/26/22 1859 7     Pain Loc --      Pain Edu? --      Excl. in [place]? --     Most recent vital signs: Vitals:   08/26/22 2108 08/26/22 2353  BP: 132/88 109/70  Pulse: 60 64  Resp: 18 20  Temp: 98.2 F (36.8 C)   SpO2: 94% 98%    CONSTITUTIONAL: Alert and oriented and responds appropriately to questions.  Chronically ill-appearing, morbidly obese HEAD: Normocephalic, atraumatic EYES: Conjunctivae clear, pupils appear equal, sclera nonicteric ENT: normal nose; moist mucous membranes NECK: Supple, normal ROM CARD: RRR; S1 and S2 appreciated; no murmurs, no clicks, no rubs, no gallops RESP: Normal chest excursion without splinting or tachypnea; breath sounds clear and equal bilaterally; no wheezes, no rhonchi, no rales, no hypoxia or  respiratory distress, speaking full sentences ABD/GI: Normal bowel sounds; non-distended; soft, non-tender, no rebound, no guarding, no peritoneal signs BACK: The back appears normal EXT: Normal ROM in all joints; no deformity noted, no edema; no cyanosis, no calf tenderness or calf swelling SKIN: Normal color for age and race; warm; no rash on exposed skin NEURO: Moves all extremities equally, normal speech PSYCH: The patient's mood and manner are appropriate.   ED Results / Procedures /  Treatments   LABS: (all labs ordered are listed, but only abnormal results are displayed) Labs Reviewed  BASIC METABOLIC PANEL - Abnormal; Notable for the following components:      Result Value   CO2 21 (*)    Glucose, Bld 129 (*)    All other components within normal limits  CBC  URINE DRUG SCREEN, QUALITATIVE ([facility] ONLY)  TROPONIN I (HIGH SENSITIVITY)  TROPONIN I (HIGH SENSITIVITY)     EKG:  EKG Interpretation  Date/Time:  Thursday August 26 2022 19:01:01 EST Ventricular Rate:  70 PR Interval:  200 QRS Duration: 104 QT Interval:  422 QTC Calculation: 455 R Axis:   -23 Text Interpretation: Normal sinus rhythm Low voltage QRS Incomplete right bundle branch block Borderline ECG When compared with ECG of 14-Apr-2022 22:41, No significant change was found Confirmed by Pryor Curia [id number]) on 08/26/2022 11:31:41 PM         RADIOLOGY: My personal review and interpretation of imaging: Chest x-ray shows cardiomegaly.  I have personally reviewed all radiology reports.   DG Chest 2 View  Result Date: 08/26/2022 CLINICAL DATA:  Chest pain.  Midsternal pain radiating to left arm EXAM: CHEST - 2 VIEW COMPARISON:  04/14/2022 FINDINGS: Borderline cardiomegaly.The cardiomediastinal contours are stable. Mild diffuse peribronchial thickening. Pulmonary vasculature is normal. No consolidation, pleural effusion, or pneumothorax. No acute osseous abnormalities are seen. IMPRESSION: Borderline  cardiomegaly. Mild diffuse peribronchial thickening. Electronically Signed   By: Keith Rake M.D.   On: 08/26/2022 19:34     PROCEDURES:  Critical Care performed: No   .1-3 Lead EKG Interpretation  Performed by: Rafaella Kole, Delice Bison, DO Authorized by: Fujie Dickison, Delice Bison, DO     Interpretation: normal       IMPRESSION / MDM / [place] / ED COURSE  I reviewed the triage vital signs and the nursing notes.    Patient here with chest pain and multiple risk factors.  States this feels like his anginal equivalent.  The patient is on the cardiac monitor to evaluate for evidence of arrhythmia and/or significant heart rate changes.   DIFFERENTIAL DIAGNOSIS (includes but not limited to):   ACS, PE, dissection, COPD, pneumonia, pneumothorax   Patient's presentation is most consistent with acute presentation with potential threat to life or bodily function.   PLAN: Patient here with complaints of chest pain that feels like his previous anginal equivalent.  Troponin x 2 negative from triage.  EKG shows incomplete right bundle branch block with no new ischemic change.  Chest x-ray reviewed and interpreted by myself and the radiologist and shows cardiomegaly but no edema or widened mediastinum.  No leukocytosis, normal electrolytes.  Will discuss with hospitalist for admission for concerns for unstable angina.  Will start heparin.  Will give morphine for pain control.   MEDICATIONS GIVEN IN ED: Medications  aspirin chewable tablet 324 mg (81 mg Oral Given 08/27/22 0004)  morphine (PF) 4 MG/ML injection 4 mg (4 mg Intravenous Given 08/27/22 0005)  ondansetron (ZOFRAN) injection 4 mg (4 mg Intravenous Given 08/27/22 0004)     ED COURSE:  Consulted and discussed patient's case with hospitalist, Dr. Maudie Mercury.  I have recommended admission and consulting physician agrees and will place admission orders.  Patient (and family if present) agree with this plan.   I reviewed all nursing notes,  vitals, pertinent previous records.  All labs, EKGs, imaging ordered have been independently reviewed and interpreted by myself.      OUTSIDE RECORDS REVIEWED: Reviewed last  PCP note on November 2023.       FINAL CLINICAL IMPRESSION(S) / ED DIAGNOSES   Final diagnoses:  Unstable angina ([place])     Rx / DC Orders   ED Discharge Orders     None        Note:  This document was prepared using Dragon voice recognition software and may include unintentional dictation errors.   Elaisha Zahniser, Delice Bison, DO 08/27/22 [id number]

## 2022-08-26 NOTE — ED Triage Notes (Signed)
Pt comes with c/o mid sternal cp that radiates to left arm. Pt states it feels like his last heart attack. Pt states pressure. Pt unsure if on thinners

## 2022-08-26 NOTE — ED Provider Notes (Incomplete)
[facility] Provider Note    Event Date/Time   First MD Initiated Contact with Patient 08/26/22 2327     (approximate)   History   Chest Pain   HPI  [name]. is a 67 y.o. male with history of coronary  History provided by ***.    Past Medical History:  Diagnosis Date  . Anxiety disorder   . Arthritis   . CAD (coronary artery disease)    a. ~ 2000 cath at [facility];  b. 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  . CHF (congestive heart failure) ([place])   . Chronic back pain   . Chronic hip pain   . COPD (chronic obstructive pulmonary disease) ([place])   . Depression   . Essential hypertension, benign   . GERD (gastroesophageal reflux disease)   . Headache(784.0)   . History of alcohol abuse   . History of cocaine abuse ([place])   . Hyperlipidemia   . Obesity   . Pneumonia     Past Surgical History:  Procedure Laterality Date  . BACK SURGERY    . CHOLECYSTECTOMY    . CORONARY ARTERY BYPASS GRAFT    . HIP SURGERY     rt  . JOINT REPLACEMENT    . LEFT HEART CATHETERIZATION WITH CORONARY ANGIOGRAM N/A 09/12/2013   Procedure: LEFT HEART CATHETERIZATION WITH CORONARY ANGIOGRAM;  Surgeon: Burnell Blanks, MD;  Location: [facility] CATH LAB;  Service: Cardiovascular;  Laterality: N/A;  . NASAL/SINUS ENDOSCOPY    . TOTAL HIP ARTHROPLASTY  05/08/2012   Procedure: TOTAL HIP ARTHROPLASTY;  Surgeon: Kerin Salen, MD;  Location: [place];  Service: Orthopedics;  Laterality: Left;  . TOTAL HIP ARTHROPLASTY  2014 per pt    MEDICATIONS:  Prior to Admission medications   Medication Sig Start Date End Date Taking? Authorizing Provider  albuterol (PROVENTIL) (2.5 MG/3ML) 0.083% nebulizer solution Take 3 mLs (2.5 mg total) by nebulization every 6 (six) hours as needed for wheezing or shortness of breath. 10/22/20   Jennye Boroughs, MD  aspirin 81 MG tablet Take 1 tablet (81 mg total) by mouth daily. 05/31/12   Hosie Poisson, MD  budesonide-formoterol (SYMBICORT) 160-4.5 MCG/ACT inhaler Inhale 2 puffs into the lungs 2 (two) times daily.    [provider]  buPROPion (WELLBUTRIN XL) 150 MG 24 hr tablet Take 300 mg by mouth daily.    [provider]  citalopram (CELEXA) 10 MG tablet Take 10 mg by mouth daily. 02/29/20   [provider]  [employer]S MELATONIN 3 MG TABS Take 1 tablet by mouth at bedtime.  03/22/18   [provider]  fluticasone (FLONASE) 50 MCG/ACT nasal spray Place 2 sprays into both nostrils 2 (two) times daily. 12/04/19   [provider]  furosemide (LASIX) 20 MG tablet Take 40 mg by mouth every morning.     [provider]  HYDROcodone-homatropine (HYCODAN) 5-1.5 MG/5ML syrup Take 5 mLs by mouth every 6 (six) hours as needed for cough. 10/22/20   Jennye Boroughs, MD  hydrOXYzine (ATARAX/VISTARIL) 25 MG tablet Take 50 mg by mouth at bedtime. 02/10/20   [provider]  isosorbide mononitrate (IMDUR) 60 MG 24 hr tablet Take 60 mg by mouth daily. 06/22/18   [provider]  metFORMIN (GLUCOPHAGE-XR) 500 MG 24 hr tablet Take  500 mg by mouth daily before supper. 09/26/20   [provider]  mirtazapine (REMERON) 15 MG tablet Take 15 mg by mouth at bedtime. 12/21/19   [provider]  nitroGLYCERIN (NITROSTAT) 0.4 MG SL tablet Place 0.4 mg under the tongue every 5 (five) minutes as needed. For chest pain    [provider]  omeprazole (PRILOSEC) 20 MG capsule Take 20 mg by mouth every morning.     [provider]  oxyCODONE-acetaminophen (PERCOCET) 10-325 MG tablet Take 1 tablet by mouth every 4 (four) hours as needed for pain.    [provider]  PROAIR HFA 108 (90 BASE) MCG/ACT inhaler Inhale 2 puffs into the lungs every 4 (four) hours as needed for wheezing or shortness of breath.    [provider]   REPATHA SURECLICK 409 MG/ML SOAJ Inject 140 mg into the skin every 14 (fourteen) days.    [provider]  triamcinolone ointment (KENALOG) 0.1 % Apply 1 application topically 2 (two) times daily. 09/25/20   [provider]    Physical Exam   Triage Vital Signs: ED Triage Vitals  Enc Vitals Group     BP 08/26/22 2108 132/88     Pulse Rate 08/26/22 1901 70     Resp 08/26/22 1901 20     Temp 08/26/22 1901 98.3 F (36.8 C)     Temp Source 08/26/22 1901 Oral     SpO2 08/26/22 1901 98 %     Weight --      Height --      Head Circumference --      Peak Flow --      Pain Score 08/26/22 1859 7     Pain Loc --      Pain Edu? --      Excl. in [place]? --     Most recent vital signs: Vitals:   08/26/22 1901 08/26/22 2108  BP:  132/88  Pulse: 70 60  Resp: 20 18  Temp: 98.3 F (36.8 C) 98.2 F (36.8 C)  SpO2: 98% 94%    CONSTITUTIONAL: Alert and oriented and responds appropriately to questions. Well-appearing; well-nourished HEAD: Normocephalic, atraumatic EYES: Conjunctivae clear, pupils appear equal, sclera nonicteric ENT: normal nose; moist mucous membranes NECK: Supple, normal ROM CARD: RRR; S1 and S2 appreciated; no murmurs, no clicks, no rubs, no gallops RESP: Normal chest excursion without splinting or tachypnea; breath sounds clear and equal bilaterally; no wheezes, no rhonchi, no rales, no hypoxia or respiratory distress, speaking full sentences ABD/GI: Normal bowel sounds; non-distended; soft, non-tender, no rebound, no guarding, no peritoneal signs BACK: The back appears normal EXT: Normal ROM in all joints; no deformity noted, no edema; no cyanosis SKIN: Normal color for age and race; warm; no rash on exposed skin NEURO: Moves all extremities equally, normal speech PSYCH: The patient's mood and manner are appropriate.   ED Results / Procedures / Treatments   LABS: (all labs ordered are listed, but only abnormal results are displayed) Labs Reviewed   BASIC METABOLIC PANEL - Abnormal; Notable for the following components:      Result Value   CO2 21 (*)    Glucose, Bld 129 (*)    All other components within normal limits  CBC  TROPONIN I (HIGH SENSITIVITY)  TROPONIN I (HIGH SENSITIVITY)     EKG:  EKG Interpretation  Date/Time:  Thursday August 26 2022 19:01:01 EST Ventricular Rate:  70 PR Interval:  200 QRS Duration: 104 QT Interval:  422  QTC Calculation: 455 R Axis:   -23 Text Interpretation: Normal sinus rhythm Low voltage QRS Incomplete right bundle branch block Borderline ECG When compared with ECG of 14-Apr-2022 22:41, No significant change was found Confirmed by Rochele Raring [phone]) on 08/26/2022 11:31:41 PM         RADIOLOGY: My personal review and interpretation of imaging:  ***  I have personally reviewed all radiology reports.   DG Chest 2 View  Result Date: 08/26/2022 CLINICAL DATA:  Chest pain.  Midsternal pain radiating to left arm EXAM: CHEST - 2 VIEW COMPARISON:  04/14/2022 FINDINGS: Borderline cardiomegaly.The cardiomediastinal contours are stable. Mild diffuse peribronchial thickening. Pulmonary vasculature is normal. No consolidation, pleural effusion, or pneumothorax. No acute osseous abnormalities are seen. IMPRESSION: Borderline cardiomegaly. Mild diffuse peribronchial thickening. Electronically Signed   By: Narda Rutherford M.D.   On: 08/26/2022 19:34     PROCEDURES:  Critical Care performed: {CriticalCareYesNo:19197::"Yes, see critical care procedure note(s)","No"}   CRITICAL CARE Performed by: Baxter Hire Ezechiel [place]   Total critical care time: *** minutes  Critical care time was exclusive of separately billable procedures and treating other patients.  Critical care was necessary to treat or prevent imminent or life-threatening deterioration.  Critical care was time spent personally by me on the following activities: development of treatment plan with patient and/or surrogate as well as nursing,  discussions with consultants, evaluation of patient's response to treatment, examination of patient, obtaining history from patient or surrogate, ordering and performing treatments and interventions, ordering and review of laboratory studies, ordering and review of radiographic studies, pulse oximetry and re-evaluation of patient's condition.   Procedures    IMPRESSION / MDM / ASSESSMENT AND PLAN / ED COURSE  I reviewed the triage vital signs and the nursing notes.    ***  The patient is on the cardiac monitor to evaluate for evidence of arrhythmia and/or significant heart rate changes.   DIFFERENTIAL DIAGNOSIS (includes but not limited to):   ***   Patient's presentation is most consistent with {EM COPA:27473}   PLAN: ***   MEDICATIONS GIVEN IN ED: Medications - No data to display   ED COURSE:  ***   CONSULTS:  ***   OUTSIDE RECORDS REVIEWED:  ***       FINAL CLINICAL IMPRESSION(S) / ED DIAGNOSES   Final diagnoses:  None     Rx / DC Orders   ED Discharge Orders     None        Note:  This document was prepared using Dragon voice recognition software and may include unintentional dictation errors.

## 2022-08-26 NOTE — ED Notes (Signed)
Pt took 2 ntg prior to arrival per pt.

## 2022-08-27 ENCOUNTER — Encounter: Payer: Self-pay | Admitting: Internal Medicine

## 2022-08-27 ENCOUNTER — Other Ambulatory Visit: Payer: Medicare Other

## 2022-08-27 DIAGNOSIS — I10 Essential (primary) hypertension: Secondary | ICD-10-CM | POA: Diagnosis not present

## 2022-08-27 DIAGNOSIS — I200 Unstable angina: Secondary | ICD-10-CM

## 2022-08-27 DIAGNOSIS — G894 Chronic pain syndrome: Secondary | ICD-10-CM

## 2022-08-27 DIAGNOSIS — I25118 Atherosclerotic heart disease of native coronary artery with other forms of angina pectoris: Secondary | ICD-10-CM | POA: Diagnosis not present

## 2022-08-27 DIAGNOSIS — I25110 Atherosclerotic heart disease of native coronary artery with unstable angina pectoris: Secondary | ICD-10-CM | POA: Diagnosis not present

## 2022-08-27 DIAGNOSIS — I5032 Chronic diastolic (congestive) heart failure: Secondary | ICD-10-CM | POA: Diagnosis not present

## 2022-08-27 LAB — URINE DRUG SCREEN, QUALITATIVE (ARMC ONLY)
Amphetamines, Ur Screen: NOT DETECTED
Barbiturates, Ur Screen: NOT DETECTED
Benzodiazepine, Ur Scrn: NOT DETECTED
Cannabinoid 50 Ng, Ur ~~LOC~~: NOT DETECTED
Cocaine Metabolite,Ur ~~LOC~~: NOT DETECTED
MDMA (Ecstasy)Ur Screen: NOT DETECTED
Methadone Scn, Ur: NOT DETECTED
Opiate, Ur Screen: POSITIVE — AB
Phencyclidine (PCP) Ur S: NOT DETECTED
Tricyclic, Ur Screen: NOT DETECTED

## 2022-08-27 LAB — COMPREHENSIVE METABOLIC PANEL
ALT: 15 U/L (ref 0–44)
AST: 19 U/L (ref 15–41)
Albumin: 3.6 g/dL (ref 3.5–5.0)
Alkaline Phosphatase: 51 U/L (ref 38–126)
Anion gap: 6 (ref 5–15)
BUN: 13 mg/dL (ref 8–23)
CO2: 24 mmol/L (ref 22–32)
Calcium: 8.6 mg/dL — ABNORMAL LOW (ref 8.9–10.3)
Chloride: 105 mmol/L (ref 98–111)
Creatinine, Ser: 1.07 mg/dL (ref 0.61–1.24)
GFR, Estimated: >60 mL/min (ref >60)
Glucose, Bld: 142 mg/dL — ABNORMAL HIGH (ref 70–99)
Potassium: 3.4 mmol/L — ABNORMAL LOW (ref 3.5–5.1)
Sodium: 135 mmol/L (ref 135–145)
Total Bilirubin: 0.6 mg/dL (ref 0.3–1.2)
Total Protein: 7.4 g/dL (ref 6.5–8.1)

## 2022-08-27 LAB — LIPID PANEL
Cholesterol: 103 mg/dL (ref 0–200)
HDL: 31 mg/dL — ABNORMAL LOW (ref >40)
LDL Cholesterol: 54 mg/dL (ref 0–99)
Total CHOL/HDL Ratio: 3.3 RATIO
Triglycerides: 91 mg/dL (ref <150)
VLDL: 18 mg/dL (ref 0–40)

## 2022-08-27 LAB — CBC
HCT: 38.2 % — ABNORMAL LOW (ref 39.0–52.0)
Hemoglobin: 12.7 g/dL — ABNORMAL LOW (ref 13.0–17.0)
MCH: 31.2 pg (ref 26.0–34.0)
MCHC: 33.2 g/dL (ref 30.0–36.0)
MCV: 93.9 fL (ref 80.0–100.0)
Platelets: 229 10*3/uL (ref 150–400)
RBC: 4.07 MIL/uL — ABNORMAL LOW (ref 4.22–5.81)
RDW: 12.3 % (ref 11.5–15.5)
WBC: 9.5 10*3/uL (ref 4.0–10.5)
nRBC: 0 % (ref 0.0–0.2)

## 2022-08-27 LAB — HIV ANTIBODY (ROUTINE TESTING W REFLEX): HIV Screen 4th Generation wRfx: NONREACTIVE

## 2022-08-27 LAB — TROPONIN I (HIGH SENSITIVITY): Troponin I (High Sensitivity): 4 ng/L (ref <18)

## 2022-08-27 LAB — CBG MONITORING, ED
Glucose-Capillary: 156 mg/dL — ABNORMAL HIGH (ref 70–99)
Glucose-Capillary: 82 mg/dL (ref 70–99)
Glucose-Capillary: 105 mg/dL — ABNORMAL HIGH (ref 70–99)
Glucose-Capillary: 106 mg/dL — ABNORMAL HIGH (ref 70–99)

## 2022-08-27 LAB — PROTIME-INR
INR: 1.1 (ref 0.8–1.2)
Prothrombin Time: 14.3 seconds (ref 11.4–15.2)

## 2022-08-27 LAB — HEMOGLOBIN A1C
Hgb A1c MFr Bld: 6 % — ABNORMAL HIGH (ref 4.8–5.6)
Mean Plasma Glucose: 126 mg/dL

## 2022-08-27 LAB — D-DIMER, QUANTITATIVE: D-Dimer, Quant: 0.43 ug/mL-FEU (ref 0.00–0.50)

## 2022-08-27 LAB — APTT: aPTT: 32 seconds (ref 24–36)

## 2022-08-27 LAB — HEPARIN LEVEL (UNFRACTIONATED): Heparin Unfractionated: 0.72 IU/mL — ABNORMAL HIGH (ref 0.30–0.70)

## 2022-08-27 MED ORDER — FLUTICASONE FUROATE-VILANTEROL 200-25 MCG/ACT IN AEPB
1.0000 | INHALATION_SPRAY | Freq: Every day | RESPIRATORY_TRACT | Status: DC
  Administered 2022-08-27: 1 via RESPIRATORY_TRACT

## 2022-08-27 MED ORDER — FLUTICASONE PROPIONATE 50 MCG/ACT NA SUSP
2.0000 | Freq: Two times a day (BID) | NASAL | Status: DC
  Administered 2022-08-27 (×2): 2 via NASAL
  Filled 2022-08-27: qty 16

## 2022-08-27 MED ORDER — FUROSEMIDE 40 MG PO TABS
40.0000 mg | ORAL_TABLET | Freq: Every morning | ORAL | Status: DC
  Administered 2022-08-27: 40 mg via ORAL
  Filled 2022-08-27: qty 1

## 2022-08-27 MED ORDER — MIRTAZAPINE 15 MG PO TABS: 15.0000 mg | ORAL_TABLET | Freq: Every day | ORAL | Status: DC

## 2022-08-27 MED ORDER — PANTOPRAZOLE SODIUM 40 MG PO TBEC
40.0000 mg | DELAYED_RELEASE_TABLET | Freq: Every day | ORAL | Status: DC
  Administered 2022-08-27: 40 mg via ORAL
  Filled 2022-08-27: qty 1

## 2022-08-27 MED ORDER — ONDANSETRON HCL 4 MG PO TABS: 4.0000 mg | ORAL_TABLET | Freq: Four times a day (QID) | ORAL | Status: DC | PRN

## 2022-08-27 MED ORDER — HEPARIN BOLUS VIA INFUSION
4000.0000 [IU] | Freq: Once | INTRAVENOUS | Status: AC
  Administered 2022-08-27: 4000 [IU] via INTRAVENOUS
  Filled 2022-08-27: qty 4000

## 2022-08-27 MED ORDER — CITALOPRAM HYDROBROMIDE 20 MG PO TABS
10.0000 mg | ORAL_TABLET | Freq: Every day | ORAL | Status: DC
  Administered 2022-08-27: 10 mg via ORAL
  Filled 2022-08-27: qty 1

## 2022-08-27 MED ORDER — ASPIRIN 81 MG PO TBEC
81.0000 mg | DELAYED_RELEASE_TABLET | Freq: Every day | ORAL | Status: DC
  Administered 2022-08-27: 81 mg via ORAL
  Filled 2022-08-27: qty 1

## 2022-08-27 MED ORDER — ONDANSETRON HCL 4 MG/2ML IJ SOLN: 4.0000 mg | Freq: Four times a day (QID) | INTRAMUSCULAR | Status: DC | PRN

## 2022-08-27 MED ORDER — NAPHAZOLINE-GLYCERIN 0.012-0.25 % OP SOLN: 1.0000 [drp] | Freq: Four times a day (QID) | OPHTHALMIC | Status: DC | PRN

## 2022-08-27 MED ORDER — HEPARIN (PORCINE) 25000 UT/250ML-% IV SOLN
1600.0000 [IU]/h | INTRAVENOUS | Status: DC
  Administered 2022-08-27: 1700 [IU]/h via INTRAVENOUS
  Filled 2022-08-27: qty 250

## 2022-08-27 MED ORDER — ACETAMINOPHEN 325 MG PO TABS: 650.0000 mg | ORAL_TABLET | Freq: Four times a day (QID) | ORAL | Status: DC | PRN

## 2022-08-27 MED ORDER — OXYCODONE-ACETAMINOPHEN 5-325 MG PO TABS
2.0000 | ORAL_TABLET | Freq: Four times a day (QID) | ORAL | Status: DC | PRN
  Administered 2022-08-27: 2 via ORAL
  Filled 2022-08-27: qty 2

## 2022-08-27 MED ORDER — INSULIN ASPART 100 UNIT/ML IJ SOLN: 0.0000 [IU] | INTRAMUSCULAR | Status: DC

## 2022-08-27 MED ORDER — CYCLOBENZAPRINE HCL 10 MG PO TABS
10.0000 mg | ORAL_TABLET | Freq: Three times a day (TID) | ORAL | Status: DC | PRN
  Administered 2022-08-27: 10 mg via ORAL
  Filled 2022-08-27: qty 1

## 2022-08-27 MED ORDER — NITROGLYCERIN IN D5W 200-5 MCG/ML-% IV SOLN
0.0000 ug/min | INTRAVENOUS | Status: DC
  Administered 2022-08-27: 5 ug/min via INTRAVENOUS
  Filled 2022-08-27: qty 250

## 2022-08-27 MED ORDER — CYCLOBENZAPRINE HCL 10 MG PO TABS: 10.0000 mg | ORAL_TABLET | Freq: Three times a day (TID) | ORAL | 0 refills | Status: AC | PRN

## 2022-08-27 MED ORDER — ALBUTEROL SULFATE (2.5 MG/3ML) 0.083% IN NEBU: 2.5000 mg | INHALATION_SOLUTION | Freq: Four times a day (QID) | RESPIRATORY_TRACT | Status: DC | PRN

## 2022-08-27 MED ORDER — BUPROPION HCL ER (XL) 150 MG PO TB24
300.0000 mg | ORAL_TABLET | Freq: Every day | ORAL | Status: DC
  Administered 2022-08-27: 300 mg via ORAL
  Filled 2022-08-27: qty 2

## 2022-08-27 MED ORDER — ACETAMINOPHEN 325 MG RE SUPP: 650.0000 mg | Freq: Four times a day (QID) | RECTAL | Status: DC | PRN

## 2022-08-27 NOTE — Progress Notes (Signed)
ANTICOAGULATION CONSULT NOTE - Initial Consult  Pharmacy Consult for Heparin  Indication: chest pain/ACS  Allergies  Allergen Reactions   Fluticasone Furoate-Vilanterol Swelling    Tongue Swelling   Tiotropium Swelling    Tongue Swelling    Ativan [Lorazepam] Other (See Comments)    Altered Mental Status    Haloperidol Decanoate Other (See Comments)    Altered Mental Status    Atorvastatin Other (See Comments)    Pain to joints   Sulfamethoxazole-Trimethoprim Itching    Patient Measurements: Height: 6\' 3"  (190.5 cm) Weight: (!) 162.4 kg (358 lb) IBW/kg (Calculated) : 84.5 Heparin Dosing Weight: 122.7 kg   Vital Signs: Temp: 98.2 F (36.8 C) (01/04 2108) Temp Source: Oral (01/04 2108) BP: 131/96 (01/05 0000) Pulse Rate: 58 (01/05 0000)  Labs: Recent Labs    08/26/22 1903 08/26/22 2107 08/27/22 0049  HGB 13.8  --   --   HCT 42.0  --   --   PLT 255  --   --   APTT  --   --  32  LABPROT  --   --  14.3  INR  --   --  1.1  CREATININE 1.15  --   --   TROPONINIHS 4 4  --     Estimated Creatinine Clearance: 106.2 mL/min (by C-G formula based on SCr of 1.15 mg/dL).   Medical History: Past Medical History:  Diagnosis Date   Anxiety disorder    Arthritis    CAD (coronary artery disease)    a. ~ 2000 cath at [facility];  b. 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   CHF (congestive heart failure) (HCC)    Chronic back pain    Chronic hip pain    COPD (chronic obstructive pulmonary disease) (HCC)    Depression    Essential hypertension, benign    GERD (gastroesophageal reflux disease)    Headache(784.0)    History of alcohol abuse    History of cocaine abuse (HCC)    Hyperlipidemia    Obesity    Pneumonia     Medications:  (Not in a hospital admission)   Assessment: Pharmacy consulted to dose heparin in this 67 year  old male admitted with ACS/NSTEMI.  No prior anticoag noted. CrCl = 106.2 ml/min   Goal of Therapy:  Heparin level 0.3-0.7 units/ml Monitor platelets by anticoagulation protocol: Yes   Plan:  Give 4000 units bolus x 1 Start heparin infusion at 1700 units/hr Check anti-Xa level in 6 hours and daily while on heparin Continue to monitor H&H and platelets  Milfred Krammes D 08/27/2022,1:27 AM

## 2022-08-27 NOTE — Progress Notes (Signed)
[place] for IV Heparin  Indication: chest pain/ACS  Patient Measurements: Height: 6\' 3"  (190.5 cm) Weight: (!) 162.4 kg (358 lb) IBW/kg (Calculated) : 84.5 Heparin Dosing Weight: 122.7 kg   Labs: Recent Labs    08/26/22 1903 08/26/22 2107 08/27/22 0049 08/27/22 0050 08/27/22 0517 08/27/22 0819  HGB 13.8  --   --  12.7*  --   --   HCT 42.0  --   --  38.2*  --   --   PLT 255  --   --  229  --   --   APTT  --   --  32  --   --   --   LABPROT  --   --  14.3  --   --   --   INR  --   --  1.1  --   --   --   HEPARINUNFRC  --   --   --   --   --  0.72*  CREATININE 1.15  --   --  1.07  --   --   TROPONINIHS 4 4  --   --  4  --      Estimated Creatinine Clearance: 114.1 mL/min (by C-G formula based on SCr of 1.07 mg/dL).   Medical History: Past Medical History:  Diagnosis Date   Anxiety disorder    Arthritis    CAD (coronary artery disease)    a. ~ 2000 cath at [facility];  b. 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   CHF (congestive heart failure) (HCC)    Chronic back pain    Chronic hip pain    COPD (chronic obstructive pulmonary disease) (HCC)    Depression    Essential hypertension, benign    GERD (gastroesophageal reflux disease)    Headache(784.0)    History of alcohol abuse    History of cocaine abuse ([place])    Hyperlipidemia    Obesity    Pneumonia    Assessment: Pharmacy consulted to dose heparin in this 67 year old male admitted with ACS/NSTEMI.  No prior anticoag noted.  Baseline aPTT, INR, CBC within normal limits.  Goal of Therapy:  Heparin level 0.3-0.7 units/ml Monitor platelets by anticoagulation protocol: Yes   Plan:  --Heparin level is supratherapeutic --Decrease heparin infusion to 1600 units/hr --Re-check HL 6 hours from rate change --Daily CBC per protocol while  on IV heparin  Benita Gutter 08/27/2022,8:37 AM

## 2022-08-27 NOTE — H&P (Signed)
History and Physical    Patient: [name] ZOX:[id number] DOB: [birth year] DOA: 08/26/2022 DOS: the patient was seen and examined on 08/27/2022 PCP: Ronnald Nian, MD  Patient coming from: Home Cardiologist- Dr. Laurance Flatten [facility])  Chief Complaint:  Chief Complaint  Patient presents with   Chest Pain   HPI: [name] Cadman. is a 67 y.o. male with medical history significant of hypertension, hyperlipidemia, CAD s/p CABG 2018 , CHF pEF, hx of ETOH abuse/ coccaine abuse presents with c/o chest tightness starting this AM, while lying in bed.  Pt states substernal chest discomfort with radiation to the left shoulder. + dyspnea, and nausea.  Pt notes dyspnea with exertion (walking) this am Pt states that he took 2 nitro with only partial relief. The chest pain continued throughout the day and therefore he presented to the ED.  This  discomfort  reminded him of his prior chest pain before he required CABG.   In ED, T 98.3, P 70, Bp 132/88  R 20, pox 98% on RA  Wbc 9.1  hgb 13.8, Plt 255 Na 136, K 4.0, Bun 12, Creat 1.15 Glucose 123 Hco3 21 AG 12  Trop 4-> 4  EKG nsr at 70, borderline LAD, 1st degree AVB, prolonged QTC, incomplete RBBB, no st-t changes c/w acute ischemia  CXR- mild cardiomegaly, no acute process.   Pt started on heparin GTT by ED, Pt will be admitted for w/up of chest pain, unstable angina, CP at rest. + dyspnea. + nausea.    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 ~ 2000 cath at [facility];  b. 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   CHF (congestive heart failure) (HCC)    Chronic back pain    Chronic hip pain    COPD (chronic obstructive pulmonary disease) (HCC)    Depression    Essential hypertension, benign    GERD (gastroesophageal reflux disease)    Headache(784.0)    History of alcohol abuse    History of cocaine abuse ([place])    Hyperlipidemia    Obesity    Pneumonia    Past Surgical History:  Procedure Laterality Date   BACK SURGERY     CHOLECYSTECTOMY     CORONARY ARTERY BYPASS GRAFT     HIP SURGERY     rt   JOINT REPLACEMENT     LEFT HEART CATHETERIZATION WITH CORONARY ANGIOGRAM N/A 09/12/2013   Procedure: LEFT HEART CATHETERIZATION WITH CORONARY ANGIOGRAM;  Surgeon: Burnell Blanks, MD;  Location: [facility] CATH LAB;  Service: Cardiovascular;  Laterality: N/A;   NASAL/SINUS ENDOSCOPY     TOTAL HIP ARTHROPLASTY  05/08/2012   Procedure: TOTAL HIP ARTHROPLASTY;  Surgeon: Kerin Salen, MD;  Location: [place];  Service: Orthopedics;  Laterality: Left;   TOTAL HIP ARTHROPLASTY  2014 per pt   Social History:  reports that he quit smoking about 6 years ago. His smoking use included cigarettes. He has a 19.50  pack-year smoking history. He has never used smokeless tobacco. He reports current alcohol use. He reports that he does not use drugs.  Allergies  Allergen Reactions   Fluticasone Furoate-Vilanterol Swelling    Tongue Swelling   Tiotropium Swelling    Tongue Swelling    Ativan [Lorazepam] Other (See Comments)    Altered Mental Status    Haloperidol Decanoate Other (See Comments)    Altered Mental Status    Atorvastatin Other (See Comments)    Pain to joints   Sulfamethoxazole-Trimethoprim Itching    Family History  Problem Relation Age of Onset   Heart attack Mother    Heart attack Brother    Heart attack Sister    Heart attack Father     Prior to Admission medications   Medication Sig Start Date End Date Taking? Authorizing Provider  albuterol (PROVENTIL) (2.5  MG/3ML) 0.083% nebulizer solution Take 3 mLs (2.5 mg total) by nebulization every 6 (six) hours as needed for wheezing or shortness of breath. 10/22/20   Jennye Boroughs, MD  aspirin 81 MG tablet Take 1 tablet (81 mg total) by mouth daily. 05/31/12   Hosie Poisson, MD  budesonide-formoterol (SYMBICORT) 160-4.5 MCG/ACT inhaler Inhale 2 puffs into the lungs 2 (two) times daily.    [provider]  buPROPion (WELLBUTRIN XL) 150 MG 24 hr tablet Take 300 mg by mouth daily.    [provider]  citalopram (CELEXA) 10 MG tablet Take 10 mg by mouth daily. 02/29/20   [provider]  CVS MELATONIN 3 MG TABS Take 1 tablet by mouth at bedtime.  03/22/18   [provider]  fluticasone (FLONASE) 50 MCG/ACT nasal spray Place 2 sprays into both nostrils 2 (two) times daily. 12/04/19   [provider]  furosemide (LASIX) 20 MG tablet Take 40 mg by mouth every morning.     [provider]  HYDROcodone-homatropine (HYCODAN) 5-1.5 MG/5ML syrup Take 5 mLs by mouth every 6 (six) hours as needed for cough. 10/22/20   Jennye Boroughs, MD  hydrOXYzine (ATARAX/VISTARIL) 25 MG tablet Take 50 mg by mouth at bedtime. 02/10/20   [provider]  isosorbide mononitrate (IMDUR) 60 MG 24 hr tablet Take 60 mg by mouth daily. 06/22/18   [provider]  metFORMIN (GLUCOPHAGE-XR) 500 MG 24 hr tablet Take 500 mg by mouth daily before supper. 09/26/20   [provider]  mirtazapine (REMERON) 15 MG tablet Take 15 mg by mouth at bedtime. 12/21/19   [provider]  nitroGLYCERIN (NITROSTAT) 0.4 MG SL tablet Place 0.4 mg under the tongue every 5 (five) minutes as needed. For chest pain    [provider]  omeprazole (PRILOSEC) 20 MG capsule Take 20 mg by mouth every morning.     [provider]  oxyCODONE-acetaminophen (PERCOCET) 10-325 MG tablet Take 1 tablet by mouth every 4 (four) hours as needed for pain.    [provider]  PROAIR HFA  108 (90 BASE) MCG/ACT inhaler Inhale 2 puffs into the lungs every 4 (four) hours as needed for wheezing or shortness of breath.    [provider]  REPATHA SURECLICK 924 MG/ML SOAJ Inject 140 mg into the skin every 14 (fourteen) days.    [provider]  triamcinolone ointment (KENALOG) 0.1 % Apply 1 application topically 2 (two) times daily. 09/25/20   [provider]    Physical Exam: Vitals:   08/26/22 1901 08/26/22 2108 08/26/22 2353  BP:  132/88 109/70  Pulse: 70 60  64  Resp: 20 18 20   Temp: 98.3 F (36.8 C) 98.2 F (36.8 C)   TempSrc: Oral Oral   SpO2: 98% 94% 98%   Heent: anicteric, small oropharynx Neck: no jvd, no bruit Heart: midline scar, + pectus excavatum,  rrr s1, s2, no m/g/r Lung: CTAB Abd: soft, obese, nt, nd +bs Ext: no c/c/e Skin : no rash Lymph: no adenopathy Neuro: nonfocal   Data Reviewed:   Assessment and Plan: Chest pain , unstable angina NPO Trop (cycle) D dimer, if + then CTA chest Check cardiac echo Heparin gtt (started by ED) Tridil iv, Hold imdur Aspirin 81mg  po qday Hold Repatha for now Requested ED, consult cardiology but they declined.  Please consult cardiology in am for possibly cath vs stress testing  CHFpEF Cont Lasix 40mg  po qday  Dm2 STOP Metformin  Fsbs q4h, ISS  Copd Cont Symbicort 2puff bid  Anxiety/Depression Cont Wellbutrin, Cont Celexa  DVT prophylaxis: heparin iv FULL CODE Dispo: home  Pt will be admitted observation < 2 nites stay for unstable angina. Depending upon results of testing might require change to inpatient stay    Advance Care Planning:   Code Status: Prior FULL CODE  Consults: please consult cardiology in am  Family Communication: w patient  Severity of Illness: The appropriate patient status for this patient is OBSERVATION. Observation status is judged to be reasonable and necessary in order to provide the required intensity of service to ensure the patient's  safety. The patient's presenting symptoms, physical exam findings, and initial radiographic and laboratory data in the context of their medical condition is felt to place them at decreased risk for further clinical deterioration. Furthermore, it is anticipated that the patient will be medically stable for discharge from the hospital within 2 midnights of admission.   Author: Jani Gravel, MD 08/27/2022 12:10 AM  For on call review [email or web address].

## 2022-08-27 NOTE — Discharge Summary (Signed)
Discharge Summary  [name] TGP:[id number] DOB: January 27, 1956  PCP: Bubba Camp, MD  Admit date: 08/26/2022 Discharge date: 08/27/2022  Recommendations for Outpatient Follow-up:  Please follow up with your PCP with CBC and BMP in 1-2 weeks Follow up with cardiology Dr. Christell Constant in the next few weeks as already scheduled.  Discharge Diagnoses:  Active Hospital Problems   Diagnosis Date Noted   atypical angina (HCC) 08/27/2022   Chronic pain syndrome 08/27/2022   (HFpEF) heart failure with preserved ejection fraction [name])    Coronary artery disease of native artery of native heart with stable angina pectoris [name])    Essential hypertension, benign 10/20/2006    Resolved Hospital Problems  No resolved problems to display.   Discharge Condition: Stable   Diet recommendation: Diet Orders (From admission, onward)     Start     Ordered   08/27/22 1047  Diet Heart Room service appropriate? Yes; Fluid consistency: Thin  Diet effective now       Question Answer Comment  Room service appropriate? Yes   Fluid consistency: Thin      08/27/22 1046           HPI and Brief Hospital Course:   [name] Sr. is a 67 y.o. male with medical history significant of hypertension, hyperlipidemia, CAD s/p CABG 2018 , CHF pEF, hx of ETOH abuse/ coccaine abuse presents with c/o chest tightness  and pain for the last few days.  Show workup in the emergency department was unremarkable, with EKG showing no acute changes, and negative troponin.  Due to his high risk history, patient was started on nitroglycerin drip, as well as heparin drip.  Patient had continuation of slight chest pain, but was significantly improved from the time of presentation.  I consulted cardiology Dr. Mariah Milling this morning who has seen the patient in consultation in the emergency department.  He felt that his chest pain was very atypical, and given negative troponins and no changes on EKG, he did not require any  further inpatient evaluation.  Cardiology discontinued the patient's heparin drip and nitroglycerin drip this morning, and placed the patient back on his home narcotic medication, with the addition of a muscle relaxer.  This afternoon, the patient has eaten, his pain is significantly improved, and he feels ready for discharge home from the hospital.  Per cardiology recommendations, the patient has been instructed to follow-up with his primary care provider within the next couple of weeks, and to keep his outpatient follow-up appointment with his cardiologist Dr. Christell Constant.  Procedures: None   Consultations: Cardiology Dr. Mariah Milling   Discharge details, plan of care and follow up instructions were discussed with patient and any available family or care providers. Patient and family are in agreement with discharge from the hospital today and all questions were answered to their satisfaction.  Discharge Exam: BP 108/67 (BP Location: Left Arm)   Pulse 72   Temp 98.1 F (36.7 C) (Oral)   Resp 18   Ht 6\' 3"  (1.905 m)   Wt (!) 162.4 kg   SpO2 94%   BMI 44.75 kg/m  General:  Alert, oriented, calm, in no acute distress  Eyes: EOMI, clear sclerea Neck: supple, no masses, trachea mildline  Cardiovascular: RRR, no murmurs or rubs, no peripheral edema  Respiratory: clear to auscultation bilaterally, no wheezes, no crackles  Abdomen: soft, nontender, nondistended, normal bowel tones heard  Skin: dry, no rashes  Musculoskeletal: no joint effusions, normal range of motion  Psychiatric: appropriate affect, normal speech  Neurologic: extraocular muscles intact, clear speech, moving all extremities with intact sensorium   Discharge Instructions You were cared for by a hospitalist during your hospital stay. If you have any questions about your discharge medications or the care you received while you were in the hospital after you are discharged, you can call the unit and asked to speak with the hospitalist on  call if the hospitalist that took care of you is not available. Once you are discharged, your primary care physician will handle any further medical issues. Please note that NO REFILLS for any discharge medications will be authorized once you are discharged, as it is imperative that you return to your primary care physician (or establish a relationship with a primary care physician if you do not have one) for your aftercare needs so that they can reassess your need for medications and monitor your lab values.   Allergies as of 08/27/2022       Reactions   Fluticasone Furoate-vilanterol Swelling   Tongue Swelling   Tiotropium Swelling   Tongue Swelling   Ativan [lorazepam] Other (See Comments)   Altered Mental Status   Haloperidol Decanoate Other (See Comments)   Altered Mental Status   Atorvastatin Other (See Comments)   Pain to joints   Sulfamethoxazole-trimethoprim Itching        Medication List     TAKE these medications    ALPRAZolam 1 MG tablet Commonly known as: XANAX Take 1 mg by mouth at bedtime.   amiodarone 200 MG tablet Commonly known as: PACERONE Take 1 tablet by mouth daily.   aspirin 81 MG tablet Take 1 tablet (81 mg total) by mouth daily.   atorvastatin 10 MG tablet Commonly known as: LIPITOR Take 10 mg by mouth daily.   benzonatate 200 MG capsule Commonly known as: TESSALON Take 200 mg by mouth 3 (three) times daily as needed for cough.   Breo Ellipta 100-25 MCG/ACT Aepb Generic drug: fluticasone furoate-vilanterol Inhale 1 puff into the lungs daily.   budesonide-formoterol 160-4.5 MCG/ACT inhaler Commonly known as: SYMBICORT Inhale 2 puffs into the lungs 2 (two) times daily.   buPROPion 150 MG 24 hr tablet Commonly known as: WELLBUTRIN XL Take 300 mg by mouth daily.   carboxymethylcellulose 0.5 % Soln Commonly known as: REFRESH PLUS 1 drop 3 (three) times daily as needed.   carvedilol 3.125 MG tablet Commonly known as: COREG Take 3.125 mg  by mouth 2 (two) times daily.   citalopram 10 MG tablet Commonly known as: CELEXA Take 10 mg by mouth daily.   CVS Melatonin 3 MG Tabs tablet Generic drug: melatonin Take 1 tablet by mouth at bedtime.   cyclobenzaprine 10 MG tablet Commonly known as: FLEXERIL Take 1 tablet (10 mg total) by mouth 3 (three) times daily as needed for muscle spasms.   diclofenac Sodium 1 % Gel Commonly known as: VOLTAREN Apply 4 g topically 4 (four) times daily.   fluticasone 50 MCG/ACT nasal spray Commonly known as: FLONASE Place 2 sprays into both nostrils 2 (two) times daily.   furosemide 20 MG tablet Commonly known as: LASIX Take 40 mg by mouth every morning.   HYDROcodone-homatropine 5-1.5 MG/5ML syrup Commonly known as: HYCODAN Take 5 mLs by mouth every 6 (six) hours as needed for cough.   hydrOXYzine 25 MG tablet Commonly known as: ATARAX Take 50 mg by mouth at bedtime.   isosorbide mononitrate 60 MG 24 hr tablet Commonly known as: IMDUR Take 60  mg by mouth daily.   metFORMIN 500 MG 24 hr tablet Commonly known as: GLUCOPHAGE-XR Take 500 mg by mouth daily before supper.   metroNIDAZOLE 0.75 % gel Commonly known as: METROGEL Apply 1 Application topically 2 (two) times daily.   mirtazapine 15 MG tablet Commonly known as: REMERON Take 15 mg by mouth at bedtime.   Narcan 4 MG/0.1ML Liqd nasal spray kit Generic drug: naloxone Place 1 spray into the nose once.   nitroGLYCERIN 0.4 MG SL tablet Commonly known as: NITROSTAT Place 0.4 mg under the tongue every 5 (five) minutes as needed. For chest pain   omeprazole 20 MG capsule Commonly known as: PRILOSEC Take 20 mg by mouth every morning.   oxyCODONE-acetaminophen 10-325 MG tablet Commonly known as: PERCOCET Take 1 tablet by mouth every 6 (six) hours as needed for pain.   ProAir HFA 108 (90 Base) MCG/ACT inhaler Generic drug: albuterol Inhale 2 puffs into the lungs every 4 (four) hours as needed for wheezing or shortness  of breath.   albuterol (2.5 MG/3ML) 0.083% nebulizer solution Commonly known as: PROVENTIL Take 3 mLs (2.5 mg total) by nebulization every 6 (six) hours as needed for wheezing or shortness of breath.   Repatha SureClick 403 MG/ML Soaj Generic drug: Evolocumab Inject 140 mg into the skin every 14 (fourteen) days.   triamcinolone ointment 0.1 % Commonly known as: KENALOG Apply 1 application topically 2 (two) times daily.       Allergies  Allergen Reactions   Fluticasone Furoate-Vilanterol Swelling    Tongue Swelling   Tiotropium Swelling    Tongue Swelling    Ativan [Lorazepam] Other (See Comments)    Altered Mental Status    Haloperidol Decanoate Other (See Comments)    Altered Mental Status    Atorvastatin Other (See Comments)    Pain to joints   Sulfamethoxazole-Trimethoprim Itching    Follow-up Information     Ronnald Nian, MD Follow up in 2 week(s).   Specialties: Family Medicine, Psychiatry Contact information: [address] [place] [address] [place], [place] Follow up.   Why: As scheduled. Contact information: [address] [place] [address] [phone]                 The results of significant diagnostics from this hospitalization (including imaging, microbiology, ancillary and laboratory) are listed below for reference.    Significant Diagnostic Studies: DG Chest 2 View  Result Date: 08/26/2022 CLINICAL DATA:  Chest pain.  Midsternal pain radiating to left arm EXAM: CHEST - 2 VIEW COMPARISON:  04/14/2022 FINDINGS: Borderline cardiomegaly.The cardiomediastinal contours are stable. Mild diffuse peribronchial thickening. Pulmonary vasculature is normal. No consolidation, pleural effusion, or pneumothorax. No acute osseous abnormalities are seen. IMPRESSION: Borderline cardiomegaly. Mild diffuse peribronchial thickening. Electronically Signed   By: Keith Rake M.D.   On: 08/26/2022 19:34     Microbiology: No results found for this or any previous visit (from the past 240 hour(s)).   Labs: Basic Metabolic Panel: Recent Labs  Lab 08/26/22 1903 08/27/22 0050  NA 136 135  K 4.0 3.4*  CL 103 105  CO2 21* 24  GLUCOSE 129* 142*  BUN 12 13  CREATININE 1.15 1.07  CALCIUM 8.9 8.6*   Liver Function Tests: Recent Labs  Lab 08/27/22 0050  AST 19  ALT 15  ALKPHOS 51  BILITOT 0.6  PROT 7.4  ALBUMIN 3.6   No results for input(s): "LIPASE", "AMYLASE"  in the last 168 hours. No results for input(s): "AMMONIA" in the last 168 hours. CBC: Recent Labs  Lab 08/26/22 1903 08/27/22 0050  WBC 9.1 9.5  HGB 13.8 12.7*  HCT 42.0 38.2*  MCV 93.3 93.9  PLT 255 229   Cardiac Enzymes: No results for input(s): "CKTOTAL", "CKMB", "CKMBINDEX", "TROPONINI" in the last 168 hours. BNP: BNP (last 3 results) No results for input(s): "BNP" in the last 8760 hours.  ProBNP (last 3 results) No results for input(s): "PROBNP" in the last 8760 hours.  CBG: Recent Labs  Lab 08/27/22 0143 08/27/22 0512 08/27/22 0734 08/27/22 1146  GLUCAP 156* 82 105* 106*   Time spent: > 30 minutes were spent in preparing this discharge including medication reconciliation, counseling, and coordination of care.  Signed:  Qusai Kem Marry Guan, MD  [facility] 08/27/2022, 1:07 PM

## 2022-08-27 NOTE — Consult Note (Signed)
Cardiology Consult    Patient ID: [name] KLATT Sr. MRN: [id number], DOB/AGE: 1956-01-15   Admit date: 08/26/2022 Date of Consult: 08/27/2022  Primary Physician: Ronnald Nian, MD Primary Cardiologist: Elsie Stain, MD - [facility] Requesting Provider: Jerilynn Mages. Hollice Gong, MD  Patient Profile    [name] LARICCIA Sr. is a 67 y.o. male with a history of CAD s/p 1 vessel bypass in 03/9380 complicated by MRSA mediastinitis req multiple repeat surgeries, HL, HTN, prior polysubstance abuse, COPD, GERD, anxiety, and morbid obesity, who is being seen today for the evaluation of chest pain at the request of Dr. Hollice Gong.  Past Medical History   Past Medical History:  Diagnosis Date   Anxiety disorder    Arthritis    CAD (coronary artery disease)    a. ~ 2000 cath at [facility];  b. 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 08/2015 NSTEMI/Cath: 90ost RCA, otw stable anatomy-->CABG x 1 (RIMA->RCA @ [facility]); e. 07/2017 Last Cath: LM/pLAD insig plaque, LCX/OM3 30-40 diff, RCA 90ost, RIMA->RCA patent.   Chronic back pain    Chronic heart failure with preserved ejection fraction (HFpEF) ([place])    a. 08/2017 Echo: EF>55%. Mild LVH. Nl RV fxn. Triv TR.   Chronic hip pain    COPD (chronic obstructive pulmonary disease) (HCC)    Depression    Essential hypertension, benign    GERD (gastroesophageal reflux disease)    Headache(784.0)    History of alcohol abuse    History of cocaine abuse ([place])    Hyperlipidemia    Mediastinitis    a. following CABG 08/2015-->MRSA-->mult surgeries.   Obesity    Pneumonia     Past Surgical History:  Procedure Laterality Date   BACK SURGERY     CHOLECYSTECTOMY     CORONARY ARTERY BYPASS GRAFT     HIP SURGERY     rt   JOINT REPLACEMENT     LEFT HEART CATHETERIZATION WITH CORONARY ANGIOGRAM N/A 09/12/2013   Procedure:  LEFT HEART CATHETERIZATION WITH CORONARY ANGIOGRAM;  Surgeon: Burnell Blanks, MD;  Location: [facility] CATH LAB;  Service: Cardiovascular;  Laterality: N/A;   NASAL/SINUS ENDOSCOPY     TOTAL HIP ARTHROPLASTY  05/08/2012   Procedure: TOTAL HIP ARTHROPLASTY;  Surgeon: Kerin Salen, MD;  Location: [place];  Service: Orthopedics;  Laterality: Left;   TOTAL HIP ARTHROPLASTY  2014 per pt     Allergies  Allergies  Allergen Reactions   Fluticasone Furoate-Vilanterol Swelling    Tongue Swelling   Tiotropium Swelling    Tongue Swelling    Ativan [Lorazepam] Other (See Comments)    Altered Mental Status    Haloperidol Decanoate Other (See Comments)    Altered Mental Status    Atorvastatin Other (See Comments)    Pain to joints   Sulfamethoxazole-Trimethoprim Itching    History of Present Illness    67 y/o ? w/ a  h/o CAD, mediastinitis, HTN, HL, obesity, anxiety, GERD, and COPD.  He has a long h/o chest pain w/ multiple prior cardiac caths revealing an anomalous LM and nonobs CAD until 08/2015, when he presented w/ chest pain and NSTEMI.  Cath at that time showed new, severe, ostial RCA dzs.  Cor CTA showed a subpulmonic course of the LM.  He underwent CABG x 1 w/ a RIMA  RCA.  Post-op course was complicated by MRSA mediastinitis req multiple surgical interventions.  Following this, he had many bouts of chest pain in 2017 and 2018 and underwent three caths in that timeframe, the last in 07/2017, revealing patent RIMA  RCA, ost RCA dzs, and otw nonobs dzs in the left coronary system.  He says he has been followed @ [facility] in [place], however, he hasn't been seen in several years.  [name] was in his USOH until Monday 1/1, when he says he had just gotten out of the shower and was drying himself off when upon standing, he noted 5/10 left chest pain and tenderness.  In the setting of chronic arthritic pain, he says that he uses percocet 10-325 QID and continued to take his percocet, which  seemed to keep the pain at a tolerable level.  Over the course of the week, as the chest pain persisted, he became concerned that it might be cardiac in origin, and so he stopped taking his pecocet by midweek.  On 1/4, shortly after lunchtime, he noted worsening of chest pain and tenderness, which became associated with nausea.  He recognized symptoms as similar to what he experienced when he has his MI in early 2017.  He presented to the [facility] ED in the early evening of 1/4 where ECG was w/o acute ST/T changes and HsTrops have been normal.  CXR w/ cardimegaly and peribronchial thickening.  He was placed on IV ntg and heparin, and also received a dose of morphine.  Currently, he reports low-level chest pain and chest wall tenderness, which he notes is much better.  Inpatient Medications     aspirin EC  81 mg Oral Daily   buPROPion  300 mg Oral Daily   citalopram  10 mg Oral Daily   fluticasone  2 spray Each Nare BID   fluticasone furoate-vilanterol  1 puff Inhalation Daily   furosemide  40 mg Oral q morning   insulin aspart  0-6 Units Subcutaneous Q4H   mirtazapine  15 mg Oral QHS   pantoprazole  40 mg Oral Daily    Family History    Family History  Problem Relation Age of Onset   Heart attack Mother    Heart attack Brother    Heart attack Sister    Heart attack Father    He indicated that his mother is deceased. He indicated that his father is deceased. He indicated that his sister is deceased. He indicated that his brother is deceased.   Social History    Social History   Socioeconomic History   Marital status: Widowed    Spouse name: Not on file   Number of children: Not on file   Years of education: Not on file   Highest education level: Not on file  Occupational History   Occupation: UNEMPLOYED  Tobacco Use   Smoking status: Former    Packs/day: 0.50    Years: 39.00    Total pack years: 19.50    Types: Cigarettes    Quit date: 09/15/2015    Years since quitting: 6.9    Smokeless tobacco: Never  Vaping Use   Vaping Use: Never used  Substance and Sexual Activity   Alcohol use: Yes    Alcohol/week: 0.0 standard drinks of alcohol   Drug use: No   Sexual activity: Not on file  Other Topics Concern   Not on file  Social History Narrative   Not on file   Social Determinants of Health  Financial Resource Strain: Low Risk  (07/26/2018)   Overall Financial Resource Strain (CARDIA)    Difficulty of Paying Living Expenses: Not hard at all  Food Insecurity: No Food Insecurity (07/26/2018)   Hunger Vital Sign    Worried About Running Out of Food in the Last Year: Never true    Ran Out of Food in the Last Year: Never true  Transportation Needs: No Transportation Needs (07/26/2018)   PRAPARE - Hydrologist (Medical): No    Lack of Transportation (Non-Medical): No  Physical Activity: Unknown (07/26/2018)   Exercise Vital Sign    Days of Exercise per Week: Patient refused    Minutes of Exercise per Session: Patient refused  Stress: No Stress Concern Present (07/26/2018)   [place]    Feeling of Stress : Not at all  Social Connections: Unknown (07/26/2018)   Social Connection and Isolation Panel [NHANES]    Frequency of Communication with Friends and Family: Patient refused    Frequency of Social Gatherings with Friends and Family: Patient refused    Attends Religious Services: Patient refused    Active Member of Clubs or Organizations: Patient refused    Attends Archivist Meetings: Patient refused    Marital Status: Patient refused  Intimate Partner Violence: Unknown (07/26/2018)   Humiliation, Afraid, Rape, and Kick questionnaire    Fear of Current or Ex-Partner: Patient refused    Emotionally Abused: Patient refused    Physically Abused: Patient refused    Sexually Abused: Patient refused     Review of Systems    General:  No chills, fever,  night sweats or weight changes.  Cardiovascular:  +++ chest pain/tenderness, +++ chronic dyspnea on exertion, no edema, orthopnea, palpitations, paroxysmal nocturnal dyspnea. Dermatological: No rash, lesions/masses Respiratory: No cough, +++ dyspnea Urologic: No hematuria, dysuria Abdominal:   No nausea, vomiting, diarrhea, bright red blood per rectum, melena, or hematemesis Neurologic:  No visual changes, wkns, changes in mental status. All other systems reviewed and are otherwise negative except as noted above.  Physical Exam    Blood pressure 100/61, pulse 66, temperature 98.1 F (36.7 C), temperature source Oral, resp. rate 12, height 6\' 3"  (1.905 m), weight (!) 162.4 kg, SpO2 97 %.  General: Pleasant, NAD Psych: Normal affect. Neuro: Alert and oriented X 3. Moves all extremities spontaneously. HEENT: Normal  Neck: Supple, obese, difficult to gauge JVP.  No bruits. Lungs:  Resp regular and unlabored, CTA. Heart: RRR no s3, s4, or murmurs. Abdomen: Obese, soft, non-tender, non-distended, BS + x 4.  Extremities: No clubbing, cyanosis or edema. DP/PT2+, Radials 2+ and equal bilaterally.  Labs    Cardiac Enzymes Recent Labs  Lab 08/26/22 1903 08/26/22 2107 08/27/22 0517  TROPONINIHS 4 4 4      BNP    Component Value Date/Time   BNP 339.3 (H) 10/23/2020 0006    ProBNP    Component Value Date/Time   PROBNP 29.7 08/09/2014 0314    Lab Results  Component Value Date   WBC 9.5 08/27/2022   HGB 12.7 (L) 08/27/2022   HCT 38.2 (L) 08/27/2022   MCV 93.9 08/27/2022   PLT 229 08/27/2022    Recent Labs  Lab 08/27/22 0050  NA 135  K 3.4*  CL 105  CO2 24  BUN 13  CREATININE 1.07  CALCIUM 8.6*  PROT 7.4  BILITOT 0.6  ALKPHOS 51  ALT 15  AST 19  GLUCOSE 142*  Lab Results  Component Value Date   CHOL 103 08/27/2022   HDL 31 (L) 08/27/2022   LDLCALC 54 08/27/2022   TRIG 91 08/27/2022   Lab Results  Component Value Date   DDIMER 0.43 08/27/2022     Radiology Studies    DG Chest 2 View  Result Date: 08/26/2022 CLINICAL DATA:  Chest pain.  Midsternal pain radiating to left arm EXAM: CHEST - 2 VIEW COMPARISON:  04/14/2022 FINDINGS: Borderline cardiomegaly.The cardiomediastinal contours are stable. Mild diffuse peribronchial thickening. Pulmonary vasculature is normal. No consolidation, pleural effusion, or pneumothorax. No acute osseous abnormalities are seen. IMPRESSION: Borderline cardiomegaly. Mild diffuse peribronchial thickening. Electronically Signed   By: Keith Rake M.D.   On: 08/26/2022 19:34    ECG & Cardiac Imaging    RSR, 70, incomplete RBBB - personally reviewed.  Assessment & Plan    1.  Chest/Chest Wall pain; CAD: Long h/o chest pain w/ multiple caths and known anomalous LM w/ subsequent NSTEMI in 08/2015 w/ finding of severe ost RCA dzs and CTA chest showing subpulmonic course of LM.  He underwent CABG x 1 @ [facility].  Post-op course complicated by MRSA mediastinitis req multiple surgeries.  He subsequently had 3 caths and multiple CTA's following CABG, in the setting of recurrent chest pain, the last of which in 07/2017 showing patent RIMA  RCA and mild diffuse dzs in the left coronary tree.  He has been followed by Elsie Stain @ [facility] and is due to see him within the next few wks.  Beginning 1/1, he started noting 5/10 left chest pain and tenderness.  This has since been constant and he has been managing it w/ percocet.  He stopped his percocet ~ 2 days ago to see if pain worsened, as he was concerned pain may be heart related.  On 1/4, chest pain worsened significantly and became assoc w/ nausea.  Ss reminiscent of prior chest pain episodes and MI.  He presented to the ED where ECG unremarkable and HsTrops have remained normal.  Pain improved w/ morphine, though heparin and IV ntg also started.  Currently reports only mild left chest pain and tenderness.  With prolonged symptoms x several days and w/o objective evidence of  ischemia, we have stopped heparin and IV ntg.  Orders written for home dose of percocet and prn flexeril.  Provided that symptoms stable/improved, he can be d/c'd from ED w/ plan to f/u with his primary cardiologist as scheduled in a few wks.  Cont home doses of asa, statin, nitrate, ? blocker, and repatha, provided that his home list is accurate (unclear if he is on amiodarone, which is on old list).  2.  Essential HTN:  stable.  Cont home meds.  3.  HL:  cont home meds.  Signed, Murray Hodgkins, NP 08/27/2022, 9:15 AM  For questions or updates, please contact   Please consult [email or web address] for contact info under Cardiology/STEMI.

## 2022-09-12 ENCOUNTER — Emergency Department
Admission: EM | Admit: 2022-09-12 | Discharge: 2022-09-12 | Discharge status: Against Medical Advice | Payer: 59 | Attending: Emergency Medicine | Admitting: Emergency Medicine

## 2022-09-12 ENCOUNTER — Other Ambulatory Visit: Payer: Self-pay

## 2022-09-12 ENCOUNTER — Emergency Department: Payer: 59

## 2022-09-12 DIAGNOSIS — R0789 Other chest pain: Secondary | ICD-10-CM | POA: Insufficient documentation

## 2022-09-12 DIAGNOSIS — M549 Dorsalgia, unspecified: Secondary | ICD-10-CM | POA: Diagnosis not present

## 2022-09-12 DIAGNOSIS — R519 Headache, unspecified: Secondary | ICD-10-CM | POA: Insufficient documentation

## 2022-09-12 DIAGNOSIS — Z5321 Procedure and treatment not carried out due to patient leaving prior to being seen by health care provider: Secondary | ICD-10-CM | POA: Insufficient documentation

## 2022-09-12 DIAGNOSIS — I499 Cardiac arrhythmia, unspecified: Secondary | ICD-10-CM | POA: Diagnosis not present

## 2022-09-12 DIAGNOSIS — R404 Transient alteration of awareness: Secondary | ICD-10-CM | POA: Diagnosis not present

## 2022-09-12 DIAGNOSIS — R079 Chest pain, unspecified: Secondary | ICD-10-CM | POA: Diagnosis not present

## 2022-09-12 LAB — BASIC METABOLIC PANEL
Anion gap: 10 (ref 5–15)
BUN: 13 mg/dL (ref 8–23)
CO2: 21 mmol/L — ABNORMAL LOW (ref 22–32)
Calcium: 9 mg/dL (ref 8.9–10.3)
Chloride: 105 mmol/L (ref 98–111)
Creatinine, Ser: 0.99 mg/dL (ref 0.61–1.24)
GFR, Estimated: >60 mL/min (ref >60)
Glucose, Bld: 146 mg/dL — ABNORMAL HIGH (ref 70–99)
Potassium: 3.4 mmol/L — ABNORMAL LOW (ref 3.5–5.1)
Sodium: 136 mmol/L (ref 135–145)

## 2022-09-12 LAB — CBC
HCT: 41.7 % (ref 39.0–52.0)
Hemoglobin: 14.1 g/dL (ref 13.0–17.0)
MCH: 31.3 pg (ref 26.0–34.0)
MCHC: 33.8 g/dL (ref 30.0–36.0)
MCV: 92.5 fL (ref 80.0–100.0)
Platelets: 213 10*3/uL (ref 150–400)
RBC: 4.51 MIL/uL (ref 4.22–5.81)
RDW: 12.5 % (ref 11.5–15.5)
WBC: 11.1 10*3/uL — ABNORMAL HIGH (ref 4.0–10.5)
nRBC: 0 % (ref 0.0–0.2)

## 2022-09-12 LAB — TROPONIN I (HIGH SENSITIVITY): Troponin I (High Sensitivity): 3 ng/L (ref <18)

## 2022-09-12 NOTE — ED Notes (Signed)
Pt called this RN and triage RN stating that he wants to leave.  Advised pt that if he is having a cardiac event that it could be fatal.  Pt states "then it would be my time to go."  Again discussed MSE waiver with pt who states that he understand the information shared and that he wants to "go to the house and take my chances".  Pt awaiting ride, will d/c at that time if pt still wishes to leave.

## 2022-09-12 NOTE — ED Notes (Signed)
Pt overheard in lobby call a ride to pick him up.

## 2022-09-12 NOTE — ED Notes (Signed)
Pt already stating not willing to wait in lobby and if has to wait too long, will leave. Strongly advised pt not to leave as he has cardiac hx and blood work not even back. Pt in xray now.

## 2022-09-12 NOTE — ED Triage Notes (Signed)
Pt to ED from home [facility] for L chest pain non radiating pressure 7/10 since 2 hours ago. Hx MI. Took 324mg  ASA before calling EMS, EMS gave 3 NTG tablets. Pt complains of splitting HA and is on phone with family member to come pick him up if he has to wait for room in lobby.   EMS VS: 121/78 after 3 NTG, HR 82, 97%. Refused EMS IV. EMS EKG sinus arrythmia.

## 2022-09-12 NOTE — ED Notes (Signed)
PT seen walking from department at this time.  Pt picked up by his ride. Pt LWOT at this time.

## 2022-09-12 NOTE — ED Notes (Signed)
Pt to ER via EMS from home with c/o CP for 2  hours.  Cardiac hx.

## 2022-09-16 ENCOUNTER — Telehealth: Payer: Self-pay

## 2022-09-16 NOTE — Telephone Encounter (Signed)
     Patient  visit on 1/21  at [facility]   Have you been able to follow up with your primary care physician? NO  The patient was or was not able to obtain any needed medicine or equipment. NA  Are there diet recommendations that you are having difficulty following?NA   Patient expresses understanding of discharge instructions and education provided has no other needs at this time.  [place], [facility], Care Management  [phone] [address], [place], [place] [address] Phone: [phone] Email: [email or web address]

## 2022-10-19 DIAGNOSIS — I5032 Chronic diastolic (congestive) heart failure: Secondary | ICD-10-CM | POA: Diagnosis not present

## 2022-10-19 DIAGNOSIS — I2510 Atherosclerotic heart disease of native coronary artery without angina pectoris: Secondary | ICD-10-CM | POA: Diagnosis not present

## 2022-10-20 DIAGNOSIS — F17210 Nicotine dependence, cigarettes, uncomplicated: Secondary | ICD-10-CM | POA: Diagnosis not present

## 2022-10-20 DIAGNOSIS — I5032 Chronic diastolic (congestive) heart failure: Secondary | ICD-10-CM | POA: Diagnosis not present

## 2022-10-20 DIAGNOSIS — Z87891 Personal history of nicotine dependence: Secondary | ICD-10-CM | POA: Diagnosis not present

## 2022-10-21 DIAGNOSIS — J4489 Other specified chronic obstructive pulmonary disease: Secondary | ICD-10-CM | POA: Diagnosis not present

## 2022-10-21 DIAGNOSIS — F17210 Nicotine dependence, cigarettes, uncomplicated: Secondary | ICD-10-CM | POA: Diagnosis not present

## 2022-11-08 DIAGNOSIS — M5136 Other intervertebral disc degeneration, lumbar region: Secondary | ICD-10-CM | POA: Diagnosis not present

## 2022-11-08 DIAGNOSIS — M47816 Spondylosis without myelopathy or radiculopathy, lumbar region: Secondary | ICD-10-CM | POA: Diagnosis not present

## 2022-11-08 DIAGNOSIS — Z79899 Other long term (current) drug therapy: Secondary | ICD-10-CM | POA: Diagnosis not present

## 2022-11-08 DIAGNOSIS — M25551 Pain in right hip: Secondary | ICD-10-CM | POA: Diagnosis not present

## 2022-11-08 DIAGNOSIS — M791 Myalgia, unspecified site: Secondary | ICD-10-CM | POA: Diagnosis not present

## 2022-11-08 DIAGNOSIS — M25552 Pain in left hip: Secondary | ICD-10-CM | POA: Diagnosis not present

## 2022-11-08 DIAGNOSIS — G894 Chronic pain syndrome: Secondary | ICD-10-CM | POA: Diagnosis not present

## 2022-11-10 DIAGNOSIS — J449 Chronic obstructive pulmonary disease, unspecified: Secondary | ICD-10-CM | POA: Diagnosis not present

## 2022-11-10 DIAGNOSIS — Z0000 Encounter for general adult medical examination without abnormal findings: Secondary | ICD-10-CM | POA: Diagnosis not present

## 2022-11-10 DIAGNOSIS — Z87891 Personal history of nicotine dependence: Secondary | ICD-10-CM | POA: Diagnosis not present

## 2022-11-10 DIAGNOSIS — J4489 Other specified chronic obstructive pulmonary disease: Secondary | ICD-10-CM | POA: Diagnosis not present

## 2022-11-10 DIAGNOSIS — J309 Allergic rhinitis, unspecified: Secondary | ICD-10-CM | POA: Diagnosis not present

## 2022-11-10 DIAGNOSIS — I10 Essential (primary) hypertension: Secondary | ICD-10-CM | POA: Diagnosis not present

## 2022-11-10 DIAGNOSIS — J438 Other emphysema: Secondary | ICD-10-CM | POA: Diagnosis not present

## 2022-11-10 DIAGNOSIS — R7303 Prediabetes: Secondary | ICD-10-CM | POA: Diagnosis not present

## 2022-11-16 DIAGNOSIS — G894 Chronic pain syndrome: Secondary | ICD-10-CM | POA: Diagnosis not present

## 2022-11-16 DIAGNOSIS — M13861 Other specified arthritis, right knee: Secondary | ICD-10-CM | POA: Diagnosis not present

## 2022-11-16 DIAGNOSIS — G8929 Other chronic pain: Secondary | ICD-10-CM | POA: Diagnosis not present

## 2022-11-16 DIAGNOSIS — M25561 Pain in right knee: Secondary | ICD-10-CM | POA: Diagnosis not present

## 2022-11-16 DIAGNOSIS — M5441 Lumbago with sciatica, right side: Secondary | ICD-10-CM | POA: Diagnosis not present

## 2022-11-17 ENCOUNTER — Other Ambulatory Visit: Payer: Self-pay | Admitting: Neurosurgery

## 2022-11-17 DIAGNOSIS — G8929 Other chronic pain: Secondary | ICD-10-CM

## 2022-11-26 ENCOUNTER — Ambulatory Visit
Admission: RE | Admit: 2022-11-26 | Discharge: 2022-11-26 | Disposition: A | Discharge status: Home / Self Care | Payer: 59 | Source: Ambulatory Visit | Attending: Neurosurgery | Admitting: Neurosurgery

## 2022-11-26 DIAGNOSIS — M5441 Lumbago with sciatica, right side: Secondary | ICD-10-CM | POA: Insufficient documentation

## 2022-11-26 DIAGNOSIS — G8929 Other chronic pain: Secondary | ICD-10-CM | POA: Diagnosis not present

## 2022-11-26 DIAGNOSIS — M545 Low back pain, unspecified: Secondary | ICD-10-CM | POA: Diagnosis not present

## 2022-12-08 DIAGNOSIS — R1310 Dysphagia, unspecified: Secondary | ICD-10-CM | POA: Diagnosis not present

## 2022-12-08 DIAGNOSIS — R519 Headache, unspecified: Secondary | ICD-10-CM | POA: Diagnosis not present

## 2022-12-08 DIAGNOSIS — R0602 Shortness of breath: Secondary | ICD-10-CM | POA: Diagnosis not present

## 2022-12-14 DIAGNOSIS — R1311 Dysphagia, oral phase: Secondary | ICD-10-CM | POA: Diagnosis not present

## 2022-12-14 DIAGNOSIS — T1490XA Injury, unspecified, initial encounter: Secondary | ICD-10-CM | POA: Diagnosis not present

## 2022-12-14 DIAGNOSIS — I5032 Chronic diastolic (congestive) heart failure: Secondary | ICD-10-CM | POA: Diagnosis not present

## 2022-12-14 DIAGNOSIS — R1310 Dysphagia, unspecified: Secondary | ICD-10-CM | POA: Diagnosis not present

## 2022-12-14 DIAGNOSIS — I2510 Atherosclerotic heart disease of native coronary artery without angina pectoris: Secondary | ICD-10-CM | POA: Diagnosis not present

## 2022-12-14 DIAGNOSIS — R220 Localized swelling, mass and lump, head: Secondary | ICD-10-CM | POA: Diagnosis not present

## 2022-12-22 DIAGNOSIS — R7303 Prediabetes: Secondary | ICD-10-CM | POA: Diagnosis not present

## 2022-12-22 DIAGNOSIS — E538 Deficiency of other specified B group vitamins: Secondary | ICD-10-CM | POA: Diagnosis not present

## 2022-12-22 DIAGNOSIS — R519 Headache, unspecified: Secondary | ICD-10-CM | POA: Diagnosis not present

## 2022-12-22 DIAGNOSIS — R1310 Dysphagia, unspecified: Secondary | ICD-10-CM | POA: Diagnosis not present

## 2022-12-22 DIAGNOSIS — K222 Esophageal obstruction: Secondary | ICD-10-CM | POA: Diagnosis not present

## 2022-12-29 DIAGNOSIS — M25561 Pain in right knee: Secondary | ICD-10-CM | POA: Diagnosis not present

## 2023-01-06 DIAGNOSIS — M25561 Pain in right knee: Secondary | ICD-10-CM | POA: Diagnosis not present

## 2023-01-10 DIAGNOSIS — M1711 Unilateral primary osteoarthritis, right knee: Secondary | ICD-10-CM | POA: Diagnosis not present

## 2023-01-12 DIAGNOSIS — K2289 Other specified disease of esophagus: Secondary | ICD-10-CM | POA: Diagnosis not present

## 2023-01-12 DIAGNOSIS — K222 Esophageal obstruction: Secondary | ICD-10-CM | POA: Diagnosis not present

## 2023-01-12 DIAGNOSIS — R1310 Dysphagia, unspecified: Secondary | ICD-10-CM | POA: Diagnosis not present

## 2023-01-12 DIAGNOSIS — Z951 Presence of aortocoronary bypass graft: Secondary | ICD-10-CM | POA: Diagnosis not present

## 2023-01-12 DIAGNOSIS — I2510 Atherosclerotic heart disease of native coronary artery without angina pectoris: Secondary | ICD-10-CM | POA: Diagnosis not present

## 2023-01-12 DIAGNOSIS — Z9049 Acquired absence of other specified parts of digestive tract: Secondary | ICD-10-CM | POA: Diagnosis not present

## 2023-01-12 DIAGNOSIS — Z955 Presence of coronary angioplasty implant and graft: Secondary | ICD-10-CM | POA: Diagnosis not present

## 2023-01-12 DIAGNOSIS — G4733 Obstructive sleep apnea (adult) (pediatric): Secondary | ICD-10-CM | POA: Diagnosis not present

## 2023-01-12 DIAGNOSIS — K209 Esophagitis, unspecified without bleeding: Secondary | ICD-10-CM | POA: Diagnosis not present

## 2023-01-12 DIAGNOSIS — Z7984 Long term (current) use of oral hypoglycemic drugs: Secondary | ICD-10-CM | POA: Diagnosis not present

## 2023-01-12 DIAGNOSIS — Z87891 Personal history of nicotine dependence: Secondary | ICD-10-CM | POA: Diagnosis not present

## 2023-01-12 DIAGNOSIS — Z7982 Long term (current) use of aspirin: Secondary | ICD-10-CM | POA: Diagnosis not present

## 2023-01-12 DIAGNOSIS — E0580 Other thyrotoxicosis without thyrotoxic crisis or storm: Secondary | ICD-10-CM | POA: Diagnosis not present

## 2023-01-12 DIAGNOSIS — I252 Old myocardial infarction: Secondary | ICD-10-CM | POA: Diagnosis not present

## 2023-01-12 DIAGNOSIS — K2100 Gastro-esophageal reflux disease with esophagitis, without bleeding: Secondary | ICD-10-CM | POA: Diagnosis not present

## 2023-01-12 DIAGNOSIS — I10 Essential (primary) hypertension: Secondary | ICD-10-CM | POA: Diagnosis not present

## 2023-01-12 DIAGNOSIS — G629 Polyneuropathy, unspecified: Secondary | ICD-10-CM | POA: Diagnosis not present

## 2023-01-12 DIAGNOSIS — Z79899 Other long term (current) drug therapy: Secondary | ICD-10-CM | POA: Diagnosis not present

## 2023-01-12 DIAGNOSIS — K2950 Unspecified chronic gastritis without bleeding: Secondary | ICD-10-CM | POA: Diagnosis not present

## 2023-01-12 DIAGNOSIS — I4891 Unspecified atrial fibrillation: Secondary | ICD-10-CM | POA: Diagnosis not present

## 2023-01-12 DIAGNOSIS — J4489 Other specified chronic obstructive pulmonary disease: Secondary | ICD-10-CM | POA: Diagnosis not present

## 2023-01-12 DIAGNOSIS — R7303 Prediabetes: Secondary | ICD-10-CM | POA: Diagnosis not present

## 2023-01-12 DIAGNOSIS — K2970 Gastritis, unspecified, without bleeding: Secondary | ICD-10-CM | POA: Diagnosis not present

## 2023-02-02 DIAGNOSIS — M25559 Pain in unspecified hip: Secondary | ICD-10-CM | POA: Diagnosis not present

## 2023-02-02 DIAGNOSIS — M179 Osteoarthritis of knee, unspecified: Secondary | ICD-10-CM | POA: Diagnosis not present

## 2023-02-02 DIAGNOSIS — Z79899 Other long term (current) drug therapy: Secondary | ICD-10-CM | POA: Diagnosis not present

## 2023-02-02 DIAGNOSIS — M47816 Spondylosis without myelopathy or radiculopathy, lumbar region: Secondary | ICD-10-CM | POA: Diagnosis not present

## 2023-02-02 DIAGNOSIS — Z79891 Long term (current) use of opiate analgesic: Secondary | ICD-10-CM | POA: Diagnosis not present

## 2023-02-25 ENCOUNTER — Other Ambulatory Visit: Payer: Self-pay

## 2023-02-25 ENCOUNTER — Encounter: Payer: Self-pay | Admitting: Emergency Medicine

## 2023-02-25 ENCOUNTER — Emergency Department
Admission: EM | Admit: 2023-02-25 | Discharge: 2023-02-25 | Disposition: A | Discharge status: Home / Self Care | Payer: 59 | Attending: Student | Admitting: Student

## 2023-02-25 DIAGNOSIS — W44G1XA Audio device entering into or through a natural orifice, initial encounter: Secondary | ICD-10-CM | POA: Insufficient documentation

## 2023-02-25 DIAGNOSIS — T162XXA Foreign body in left ear, initial encounter: Secondary | ICD-10-CM

## 2023-02-25 NOTE — ED Provider Notes (Signed)
   [facility] Provider Note    None    (approximate)   History   Foreign Body in Ear   HPI  [name]. is a 67 y.o. male presents with the rubber part of his hearing aid stuck in his left ear.  He attempted to remove it himself at home but was unsuccessful.  He denies pain.     Physical Exam   Triage Vital Signs: ED Triage Vitals  Enc Vitals Group     BP 02/25/23 2228 126/70     Pulse Rate 02/25/23 2228 88     Resp 02/25/23 2228 20     Temp 02/25/23 2228 98 F (36.7 C)     Temp Source 02/25/23 2228 Oral     SpO2 02/25/23 2228 98 %     Weight --      Height --      Head Circumference --      Peak Flow --      Pain Score 02/25/23 2226 0     Pain Loc --      Pain Edu? --      Excl. in GC? --     Most recent vital signs: Vitals:   02/25/23 2228  BP: 126/70  Pulse: 88  Resp: 20  Temp: 98 F (36.7 C)  SpO2: 98%     General: Awake, no distress.  CV:  Good peripheral perfusion.  Resp:  Normal effort.  Abd:  No distention.  Other:  Rubber part of hearing aid easily visualized in EAC, no discharge   ED Results / Procedures / Treatments   Labs (all labs ordered are listed, but only abnormal results are displayed) Labs Reviewed - No data to display   PROCEDURES:  Critical Care performed: No  .Foreign Body Removal  Date/Time: 02/25/2023 10:47 PM  Performed by: Cameron Ali, PA-C Authorized by: Cameron Ali, PA-C  Consent: Verbal consent obtained. Consent given by: patient Body area: ear Location details: left ear  Sedation: Patient sedated: no  Patient restrained: no Patient cooperative: yes Localization method: visualized Removal mechanism: alligator forceps Complexity: simple 1 objects recovered. Objects recovered: rubber part of hearing aid Post-procedure assessment: foreign body removed     MEDICATIONS ORDERED IN ED: Medications - No data to display   IMPRESSION / MDM / ASSESSMENT AND  PLAN / ED COURSE  I reviewed the triage vital signs and the nursing notes.                             67 year old male presents for removal of foreign body in left ear.  Differential diagnosis includes, but is not limited to, foreign body in left ear.  Patient's presentation is most consistent with acute, uncomplicated illness.  Foreign body easily removed with alligator forceps, see procedure note above.  Patient stable at discharge.   FINAL CLINICAL IMPRESSION(S) / ED DIAGNOSES   Final diagnoses:  Foreign body of left ear, initial encounter     Rx / DC Orders   ED Discharge Orders     None        Note:  This document was prepared using Dragon voice recognition software and may include unintentional dictation errors.   Cameron Ali, PA-C 02/25/23 2250    Dionne Bucy, MD 02/26/23 [phone]

## 2023-02-25 NOTE — ED Triage Notes (Signed)
Pt arrived via POV with reports of rubber part of hearing aid stuck in R ear.

## 2023-03-02 ENCOUNTER — Telehealth: Payer: Self-pay

## 2023-03-02 NOTE — Telephone Encounter (Signed)
Transition Care Management Unsuccessful Follow-up Telephone Call  Date of discharge and from where:  02/25/2023 [facility]  Attempts:  1st Attempt  Reason for unsuccessful TCM follow-up call:  No answer/busy  Sade Hollon [facility]  [facility] Population Health Community Resource Care Guide   [email or web address]  ?? [phone]   Website: [email or web address]  [email or web address]

## 2023-03-03 ENCOUNTER — Telehealth: Payer: Self-pay

## 2023-03-03 NOTE — Telephone Encounter (Signed)
Transition Care Management Unsuccessful Follow-up Telephone Call  Date of discharge and from where:  02/25/2023 [facility]  Attempts:  2nd Attempt  Reason for unsuccessful TCM follow-up call:  No answer/busy  Hoke Baer [facility]  [facility] Population Health Community Resource Care Guide   [email or web address]  ?? [phone]   Website: [email or web address]  [email or web address]

## 2023-03-21 DIAGNOSIS — M1711 Unilateral primary osteoarthritis, right knee: Secondary | ICD-10-CM | POA: Diagnosis not present

## 2023-03-29 ENCOUNTER — Ambulatory Visit (INDEPENDENT_AMBULATORY_CARE_PROVIDER_SITE_OTHER): Payer: 59 | Admitting: Podiatry

## 2023-03-29 DIAGNOSIS — B351 Tinea unguium: Secondary | ICD-10-CM

## 2023-03-29 DIAGNOSIS — B353 Tinea pedis: Secondary | ICD-10-CM

## 2023-03-29 DIAGNOSIS — M65311 Trigger thumb, right thumb: Secondary | ICD-10-CM | POA: Diagnosis not present

## 2023-03-29 DIAGNOSIS — M79675 Pain in left toe(s): Secondary | ICD-10-CM

## 2023-03-29 DIAGNOSIS — M79674 Pain in right toe(s): Secondary | ICD-10-CM | POA: Diagnosis not present

## 2023-03-29 MED ORDER — CLOTRIMAZOLE-BETAMETHASONE 1-0.05 % EX CREA: 1.0000 | TOPICAL_CREAM | Freq: Every day | CUTANEOUS | 0 refills | Status: DC

## 2023-03-29 NOTE — Progress Notes (Signed)
  Subjective:  Patient ID: [name] Loge., male    DOB: [birth year],  MRN: [id number]  Chief Complaint  Patient presents with   Nail Problem    Nail trim    67 y.o. male returns for the above complaint.  Patient presents with thickened elongated dystrophic toenails x10.  Patient not able to do it himself.  He would like for me to debride them down.  He states is painful with ambulation.  He has secondary complaint of right athlete's foot with some itching but mostly skin peeling.  He would like to discuss treatment options for this  Objective:  There were no vitals filed for this visit. Podiatric Exam: Vascular: dorsalis pedis and posterior tibial pulses are palpable bilateral. Capillary return is immediate. Temperature gradient is WNL. Skin turgor WNL  Sensorium: Normal Semmes Weinstein monofilament test. Normal tactile sensation bilaterally. Nail Exam: Pt has thick disfigured discolored nails with subungual debris noted bilateral entire nail hallux through fifth toenails.  Pain on palpation to the nails. Ulcer Exam: There is no evidence of ulcer or pre-ulcerative changes or infection. Orthopedic Exam: Muscle tone and strength are WNL. No limitations in general ROM. No crepitus or effusions noted. HAV  B/L.  Hammer toes 2-5  B/L. Skin: No Porokeratosis. No infection or ulcers foot.  Skin epidermolysis noted with subjective component of itching.  No open wounds or lesion noted   Assessment & Plan:   1. Athlete's foot on right   2. Pain due to onychomycosis of toenails of both feet      Patient was evaluated and treated and all questions answered.  Athlete's foot -In the region the etiology arises with numerous treatment options were discussed.  Given the epidermal lysis and itching I believe patient will benefit from Lotrisone cream Lotrisone cream was sent to the pharmacy.  He states understanding will apply twice a day  Onychomycosis with pain  -Nails palliatively debrided as  below. -Educated on self-care  Procedure: Nail Debridement Rationale: pain  Type of Debridement: manual, sharp debridement. Instrumentation: Nail nipper, rotary burr. Number of Nails: 10  Procedures and Treatment: Consent by patient was obtained for treatment procedures. The patient understood the discussion of treatment and procedures well. All questions were answered thoroughly reviewed. Debridement of mycotic and hypertrophic toenails, 1 through 5 bilateral and clearing of subungual debris. No ulceration, no infection noted.  Return Visit-Office Procedure: Patient instructed to return to the office for a follow up visit 3 months for continued evaluation and treatment.  Nicholes Rough, DPM    No follow-ups on file.

## 2023-04-05 ENCOUNTER — Telehealth: Payer: Self-pay | Admitting: Podiatry

## 2023-04-05 NOTE — Telephone Encounter (Signed)
Pt called stating his foot is not any better and I have scheduled him to come in next week to see you again.  He also stated that you gave him a rx for [employer] for diabetic shoes and they are needing additional paperwork to be signed and his pcp said they would not sign it..  I explained that the pcp has to sign the paperwork as we are not the providers office that treats your diabetes. That medicare requires a signature from them stating you are diabetic.

## 2023-04-06 ENCOUNTER — Telehealth: Payer: Self-pay | Admitting: Podiatry

## 2023-04-06 NOTE — Telephone Encounter (Signed)
[employer] would like the paperwork from pt last foot exam faxed over the fax number is [phone]

## 2023-04-12 ENCOUNTER — Ambulatory Visit: Payer: 59 | Admitting: Podiatry

## 2023-04-13 DIAGNOSIS — M47816 Spondylosis without myelopathy or radiculopathy, lumbar region: Secondary | ICD-10-CM | POA: Diagnosis not present

## 2023-04-13 DIAGNOSIS — Z79899 Other long term (current) drug therapy: Secondary | ICD-10-CM | POA: Diagnosis not present

## 2023-04-13 DIAGNOSIS — M25559 Pain in unspecified hip: Secondary | ICD-10-CM | POA: Diagnosis not present

## 2023-04-13 DIAGNOSIS — M179 Osteoarthritis of knee, unspecified: Secondary | ICD-10-CM | POA: Diagnosis not present

## 2023-04-15 DIAGNOSIS — R7303 Prediabetes: Secondary | ICD-10-CM | POA: Diagnosis not present

## 2023-04-15 DIAGNOSIS — R21 Rash and other nonspecific skin eruption: Secondary | ICD-10-CM | POA: Diagnosis not present

## 2023-04-15 DIAGNOSIS — G629 Polyneuropathy, unspecified: Secondary | ICD-10-CM | POA: Diagnosis not present

## 2023-04-15 DIAGNOSIS — R252 Cramp and spasm: Secondary | ICD-10-CM | POA: Diagnosis not present

## 2023-04-18 ENCOUNTER — Encounter: Payer: Self-pay | Admitting: Emergency Medicine

## 2023-04-18 ENCOUNTER — Other Ambulatory Visit: Payer: Self-pay

## 2023-04-18 ENCOUNTER — Emergency Department: Payer: 59

## 2023-04-18 ENCOUNTER — Emergency Department
Admission: EM | Admit: 2023-04-18 | Discharge: 2023-04-18 | Disposition: A | Discharge status: Home / Self Care | Payer: 59 | Attending: Emergency Medicine | Admitting: Emergency Medicine

## 2023-04-18 DIAGNOSIS — L089 Local infection of the skin and subcutaneous tissue, unspecified: Secondary | ICD-10-CM | POA: Insufficient documentation

## 2023-04-18 DIAGNOSIS — E039 Hypothyroidism, unspecified: Secondary | ICD-10-CM | POA: Insufficient documentation

## 2023-04-18 DIAGNOSIS — J449 Chronic obstructive pulmonary disease, unspecified: Secondary | ICD-10-CM | POA: Insufficient documentation

## 2023-04-18 DIAGNOSIS — I110 Hypertensive heart disease with heart failure: Secondary | ICD-10-CM | POA: Insufficient documentation

## 2023-04-18 DIAGNOSIS — I509 Heart failure, unspecified: Secondary | ICD-10-CM | POA: Diagnosis not present

## 2023-04-18 DIAGNOSIS — R519 Headache, unspecified: Secondary | ICD-10-CM | POA: Insufficient documentation

## 2023-04-18 LAB — CBC WITH DIFFERENTIAL/PLATELET
Abs Immature Granulocytes: 0.07 10*3/uL (ref 0.00–0.07)
Basophils Absolute: 0.1 10*3/uL (ref 0.0–0.1)
Basophils Relative: 1 %
Eosinophils Absolute: 0.3 10*3/uL (ref 0.0–0.5)
Eosinophils Relative: 3 %
HCT: 40.3 % (ref 39.0–52.0)
Hemoglobin: 13.4 g/dL (ref 13.0–17.0)
Immature Granulocytes: 1 %
Lymphocytes Relative: 23 %
Lymphs Abs: 1.9 10*3/uL (ref 0.7–4.0)
MCH: 31.6 pg (ref 26.0–34.0)
MCHC: 33.3 g/dL (ref 30.0–36.0)
MCV: 95 fL (ref 80.0–100.0)
Monocytes Absolute: 0.8 10*3/uL (ref 0.1–1.0)
Monocytes Relative: 10 %
Neutro Abs: 5.4 10*3/uL (ref 1.7–7.7)
Neutrophils Relative %: 62 %
Platelets: 240 10*3/uL (ref 150–400)
RBC: 4.24 MIL/uL (ref 4.22–5.81)
RDW: 12.1 % (ref 11.5–15.5)
WBC: 8.6 10*3/uL (ref 4.0–10.5)
nRBC: 0 % (ref 0.0–0.2)

## 2023-04-18 LAB — COMPREHENSIVE METABOLIC PANEL
ALT: 19 U/L (ref 0–44)
AST: 17 U/L (ref 15–41)
Albumin: 3.7 g/dL (ref 3.5–5.0)
Alkaline Phosphatase: 44 U/L (ref 38–126)
Anion gap: 9 (ref 5–15)
BUN: 15 mg/dL (ref 8–23)
CO2: 26 mmol/L (ref 22–32)
Calcium: 8.9 mg/dL (ref 8.9–10.3)
Chloride: 103 mmol/L (ref 98–111)
Creatinine, Ser: 1.18 mg/dL (ref 0.61–1.24)
GFR, Estimated: >60 mL/min (ref >60)
Glucose, Bld: 175 mg/dL — ABNORMAL HIGH (ref 70–99)
Potassium: 3.7 mmol/L (ref 3.5–5.1)
Sodium: 138 mmol/L (ref 135–145)
Total Bilirubin: 0.5 mg/dL (ref 0.3–1.2)
Total Protein: 7.5 g/dL (ref 6.5–8.1)

## 2023-04-18 MED ORDER — CEPHALEXIN 500 MG PO CAPS: 500.0000 mg | ORAL_CAPSULE | Freq: Three times a day (TID) | ORAL | 0 refills | Status: AC

## 2023-04-18 MED ORDER — OXYCODONE-ACETAMINOPHEN 5-325 MG PO TABS
2.0000 | ORAL_TABLET | Freq: Once | ORAL | Status: AC
  Administered 2023-04-18: 2 via ORAL
  Filled 2023-04-18: qty 2

## 2023-04-18 NOTE — ED Triage Notes (Signed)
Patient to ED via POV for headache. States he woke up with knot on back of head 2 days ago and pain since. Denies fall or hitting head. NAD noted.

## 2023-04-18 NOTE — ED Provider Notes (Signed)
[facility] Provider Note    Event Date/Time   First MD Initiated Contact with Patient 04/18/23 0845     (approximate)   History   Headache   HPI  [name] Sr. is a 67 y.o. male   presents to the ED with complaint of headache posteriorly for the last 2 days.  Patient also reports a knot to the left posterior portion of his scalp.  He reports objective fever last night.  He denies any other URI symptoms, pharyngitis, ear pain.  No recent or prior injury to his head.  Patient has history of non-STEMI, heart failure, hypertension, COPD, morbid obesity, anxiety disorder, history of polysubstance abuse, hypothyroidism, chronic pain syndrome.      Physical Exam   Triage Vital Signs: ED Triage Vitals [04/18/23 0839]  Encounter Vitals Group     BP 98/79     Systolic BP Percentile      Diastolic BP Percentile      Pulse Rate 74     Resp 18     Temp 98.2 F (36.8 C)     Temp Source Oral     SpO2 96 %     Weight (!) 372 lb (168.7 kg)     Height 6\' 3"  (1.905 m)     Head Circumference      Peak Flow      Pain Score 6     Pain Loc      Pain Education      Exclude from Growth Chart     Most recent vital signs: Vitals:   04/18/23 0839  BP: 98/79  Pulse: 74  Resp: 18  Temp: 98.2 F (36.8 C)  SpO2: 96%     General: Awake, no distress.  Alert, talkative, answers questions appropriately. CV:  Good peripheral perfusion.  Resp:  Normal effort.  Abd:  No distention.  Other:  There is tenderness on palpation of the left posterior scalp with some mild soft tissue edema.  No abrasions pustules or papules are noted.  Minimal erythema noted in the skin fold at the base of his skull.  No drainage or discoloration appreciated.  Neck is supple without cervical lymphadenopathy.  Nontender cervical spine palpation posteriorly.  PERRLA, EOMI's, cranial nerves II through XII grossly intact.   ED Results / Procedures / Treatments   Labs (all labs ordered  are listed, but only abnormal results are displayed) Labs Reviewed  COMPREHENSIVE METABOLIC PANEL - Abnormal; Notable for the following components:      Result Value   Glucose, Bld 175 (*)    All other components within normal limits  CBC WITH DIFFERENTIAL/PLATELET     RADIOLOGY CT head per radiologist is negative for any acute intracranial  abnormalities.    PROCEDURES:  Critical Care performed:   Procedures   MEDICATIONS ORDERED IN ED: Medications  oxyCODONE-acetaminophen (PERCOCET/ROXICET) 5-325 MG per tablet 2 tablet (2 tablets Oral Given 04/18/23 0903)     IMPRESSION / MDM / ASSESSMENT AND PLAN / ED COURSE  I reviewed the triage vital signs and the nursing notes.   Differential diagnosis includes, but is not limited to, early skin abscess considered, headache.  67 year old male presents to the ED with complaint of a knot to the back of his head causing headache.  Exam was negative for skin discoloration or rash.  There was marked localized tenderness which could represent an early bacterial infection versus abscess.  CT scan was negative for acute intracranial normalities.  A prescription for Keflex was sent to the pharmacy and patient is encouraged to use warm moist compresses to the area frequently.  He is to follow-up with his PCP if any continued problems.  Patient currently has pain medication at home written by his PCP for his chronic pain.      Patient's presentation is most consistent with acute complicated illness / injury requiring diagnostic workup.  FINAL CLINICAL IMPRESSION(S) / ED DIAGNOSES   Final diagnoses:  Skin infection     Rx / DC Orders   ED Discharge Orders          Ordered    cephALEXin (KEFLEX) 500 MG capsule  3 times daily        04/18/23 1115             Note:  This document was prepared using Dragon voice recognition software and may include unintentional dictation errors.   Tommi Rumps, PA-C 04/18/23 1331     Trinna Post, MD 04/18/23 1537

## 2023-04-18 NOTE — Discharge Instructions (Signed)
Call make an appointment with your primary care provider if any continued problems or concerns.  Use warm moist compresses to the back of your head which should help with the tenderness along with your headache.  A prescription for cephalexin was sent to the pharmacy for you to take 3 times a day for the next 10 days.  You may continue your regularly scheduled pain medication as prescribed by your doctor.

## 2023-05-18 DIAGNOSIS — M179 Osteoarthritis of knee, unspecified: Secondary | ICD-10-CM | POA: Diagnosis not present

## 2023-05-18 DIAGNOSIS — G894 Chronic pain syndrome: Secondary | ICD-10-CM | POA: Diagnosis not present

## 2023-05-18 DIAGNOSIS — M5136 Other intervertebral disc degeneration, lumbar region: Secondary | ICD-10-CM | POA: Diagnosis not present

## 2023-05-18 DIAGNOSIS — M47816 Spondylosis without myelopathy or radiculopathy, lumbar region: Secondary | ICD-10-CM | POA: Diagnosis not present

## 2023-05-18 DIAGNOSIS — Z79899 Other long term (current) drug therapy: Secondary | ICD-10-CM | POA: Diagnosis not present

## 2023-05-18 DIAGNOSIS — M25559 Pain in unspecified hip: Secondary | ICD-10-CM | POA: Diagnosis not present

## 2023-05-23 ENCOUNTER — Emergency Department
Admission: EM | Admit: 2023-05-23 | Discharge: 2023-05-23 | Discharge status: Against Medical Advice | Payer: 59 | Attending: Emergency Medicine | Admitting: Emergency Medicine

## 2023-05-23 DIAGNOSIS — Z5321 Procedure and treatment not carried out due to patient leaving prior to being seen by health care provider: Secondary | ICD-10-CM | POA: Insufficient documentation

## 2023-05-23 DIAGNOSIS — L0211 Cutaneous abscess of neck: Secondary | ICD-10-CM | POA: Insufficient documentation

## 2023-05-23 LAB — COMPREHENSIVE METABOLIC PANEL
ALT: 22 U/L (ref 0–44)
AST: 20 U/L (ref 15–41)
Albumin: 4.1 g/dL (ref 3.5–5.0)
Alkaline Phosphatase: 46 U/L (ref 38–126)
Anion gap: 11 (ref 5–15)
BUN: 19 mg/dL (ref 8–23)
CO2: 23 mmol/L (ref 22–32)
Calcium: 9.2 mg/dL (ref 8.9–10.3)
Chloride: 105 mmol/L (ref 98–111)
Creatinine, Ser: 1.13 mg/dL (ref 0.61–1.24)
GFR, Estimated: >60 mL/min (ref >60)
Glucose, Bld: 138 mg/dL — ABNORMAL HIGH (ref 70–99)
Potassium: 3.4 mmol/L — ABNORMAL LOW (ref 3.5–5.1)
Sodium: 139 mmol/L (ref 135–145)
Total Bilirubin: 0.8 mg/dL (ref 0.3–1.2)
Total Protein: 8.3 g/dL — ABNORMAL HIGH (ref 6.5–8.1)

## 2023-05-23 LAB — CBC WITH DIFFERENTIAL/PLATELET
Abs Immature Granulocytes: 0.04 10*3/uL (ref 0.00–0.07)
Basophils Absolute: 0.1 10*3/uL (ref 0.0–0.1)
Basophils Relative: 1 %
Eosinophils Absolute: 0.3 10*3/uL (ref 0.0–0.5)
Eosinophils Relative: 3 %
HCT: 44.2 % (ref 39.0–52.0)
Hemoglobin: 14.9 g/dL (ref 13.0–17.0)
Immature Granulocytes: 0 %
Lymphocytes Relative: 26 %
Lymphs Abs: 2.6 10*3/uL (ref 0.7–4.0)
MCH: 31.8 pg (ref 26.0–34.0)
MCHC: 33.7 g/dL (ref 30.0–36.0)
MCV: 94.2 fL (ref 80.0–100.0)
Monocytes Absolute: 1.3 10*3/uL — ABNORMAL HIGH (ref 0.1–1.0)
Monocytes Relative: 13 %
Neutro Abs: 5.9 10*3/uL (ref 1.7–7.7)
Neutrophils Relative %: 57 %
Platelets: 251 10*3/uL (ref 150–400)
RBC: 4.69 MIL/uL (ref 4.22–5.81)
RDW: 12.2 % (ref 11.5–15.5)
WBC: 10.2 10*3/uL (ref 4.0–10.5)
nRBC: 0 % (ref 0.0–0.2)

## 2023-05-23 NOTE — ED Triage Notes (Signed)
Pt presents to the ED with a known abscess to the left posterior neck. Reports that he has been seen for this in the past and that they started him on antibiotics. States that he finished this course of antibiotics 2 days ago. Pt reports that it started hurting this morning and he was told that if it started hurting he needed to come back to the Emergency department.

## 2023-05-28 ENCOUNTER — Other Ambulatory Visit: Payer: Self-pay | Admitting: Podiatry

## 2023-09-21 ENCOUNTER — Emergency Department: Payer: Medicare HMO

## 2023-09-21 ENCOUNTER — Encounter: Payer: Self-pay | Admitting: *Deleted

## 2023-09-21 ENCOUNTER — Other Ambulatory Visit: Payer: Self-pay

## 2023-09-21 ENCOUNTER — Emergency Department
Admission: EM | Admit: 2023-09-21 | Discharge: 2023-09-21 | Discharge status: Against Medical Advice | Payer: Medicare HMO | Attending: Student | Admitting: Student

## 2023-09-21 DIAGNOSIS — R200 Anesthesia of skin: Secondary | ICD-10-CM | POA: Diagnosis not present

## 2023-09-21 DIAGNOSIS — M542 Cervicalgia: Secondary | ICD-10-CM | POA: Insufficient documentation

## 2023-09-21 DIAGNOSIS — Z5321 Procedure and treatment not carried out due to patient leaving prior to being seen by health care provider: Secondary | ICD-10-CM | POA: Insufficient documentation

## 2023-09-21 LAB — CBC WITH DIFFERENTIAL/PLATELET
Abs Immature Granulocytes: 0.06 10*3/uL (ref 0.00–0.07)
Basophils Absolute: 0.1 10*3/uL (ref 0.0–0.1)
Basophils Relative: 1 %
Eosinophils Absolute: 0.2 10*3/uL (ref 0.0–0.5)
Eosinophils Relative: 2 %
HCT: 43.9 % (ref 39.0–52.0)
Hemoglobin: 14.9 g/dL (ref 13.0–17.0)
Immature Granulocytes: 1 %
Lymphocytes Relative: 18 %
Lymphs Abs: 2.4 10*3/uL (ref 0.7–4.0)
MCH: 31.4 pg (ref 26.0–34.0)
MCHC: 33.9 g/dL (ref 30.0–36.0)
MCV: 92.6 fL (ref 80.0–100.0)
Monocytes Absolute: 1.5 10*3/uL — ABNORMAL HIGH (ref 0.1–1.0)
Monocytes Relative: 11 %
Neutro Abs: 8.8 10*3/uL — ABNORMAL HIGH (ref 1.7–7.7)
Neutrophils Relative %: 67 %
Platelets: 269 10*3/uL (ref 150–400)
RBC: 4.74 MIL/uL (ref 4.22–5.81)
RDW: 12.5 % (ref 11.5–15.5)
WBC: 13.1 10*3/uL — ABNORMAL HIGH (ref 4.0–10.5)
nRBC: 0 % (ref 0.0–0.2)

## 2023-09-21 LAB — COMPREHENSIVE METABOLIC PANEL
ALT: 17 U/L (ref 0–44)
AST: 17 U/L (ref 15–41)
Albumin: 3.9 g/dL (ref 3.5–5.0)
Alkaline Phosphatase: 50 U/L (ref 38–126)
Anion gap: 12 (ref 5–15)
BUN: 18 mg/dL (ref 8–23)
CO2: 21 mmol/L — ABNORMAL LOW (ref 22–32)
Calcium: 9 mg/dL (ref 8.9–10.3)
Chloride: 103 mmol/L (ref 98–111)
Creatinine, Ser: 1.16 mg/dL (ref 0.61–1.24)
GFR, Estimated: >60 mL/min (ref >60)
Glucose, Bld: 107 mg/dL — ABNORMAL HIGH (ref 70–99)
Potassium: 4 mmol/L (ref 3.5–5.1)
Sodium: 136 mmol/L (ref 135–145)
Total Bilirubin: 0.7 mg/dL (ref 0.0–1.2)
Total Protein: 8.1 g/dL (ref 6.5–8.1)

## 2023-09-21 LAB — TROPONIN I (HIGH SENSITIVITY): Troponin I (High Sensitivity): 3 ng/L (ref <18)

## 2023-09-21 NOTE — ED Notes (Signed)
Patient to First Nurse desk stating he no longer wants to stay and will be going to [facility]. IV removed at this time. Pt ambulatory out of ED. NAD noted.

## 2023-09-21 NOTE — ED Notes (Signed)
Pt seen by PA in triage

## 2023-09-21 NOTE — ED Provider Triage Note (Signed)
Emergency Medicine Provider Triage Evaluation Note  [name] MCGLORY Sr. , a 68 y.o. male  was evaluated in triage.  Pt complains of left-sided neck pain that radiates to the hand, associated with numbness.  Patient has history of myocardial infarction.  Denies chest pain  Review of Systems  Positive:  Negative:  Physical Exam  Wt (!) 153.5 kg   BMI 42.30 kg/m  Gen:   Awake, no distress   Resp:  Normal effort, no wheezing no crackles MSK:   Moves extremities without difficulty  Other:  Cardiopulmonary regular rhythm  Medical Decision Making  Medically screening exam initiated at 4:44 PM.  Appropriate orders placed.  [name] Lions Sr. was informed that the remainder of the evaluation will be completed by another provider, this initial triage assessment does not replace that evaluation, and the importance of remaining in the ED until their evaluation is complete.  Patient with neck pain that radiates to the left hand, ordered CT scan of the neck, troponins   Gladys Damme, PA-C 09/21/23 1646

## 2023-09-21 NOTE — ED Triage Notes (Signed)
Pt with pain in back of neck on left side with numbness into left hand.  Pt was seen in triage by PA.  NO CP with this

## 2024-03-29 ENCOUNTER — Ambulatory Visit (INDEPENDENT_AMBULATORY_CARE_PROVIDER_SITE_OTHER): Admitting: Podiatry

## 2024-03-29 DIAGNOSIS — B353 Tinea pedis: Secondary | ICD-10-CM | POA: Diagnosis not present

## 2024-03-29 MED ORDER — TERBINAFINE HCL 250 MG PO TABS: 250.0000 mg | ORAL_TABLET | Freq: Every day | ORAL | 0 refills | Status: AC

## 2024-03-29 MED ORDER — CLOTRIMAZOLE-BETAMETHASONE 1-0.05 % EX CREA: 1.0000 | TOPICAL_CREAM | Freq: Every day | CUTANEOUS | 0 refills | Status: AC

## 2024-03-29 NOTE — Progress Notes (Signed)
  Subjective:  Patient ID: [name] ONEIDA Reatha Chrystal., male    DOB: Dec 06, 1955,  MRN: [id number]  Chief Complaint  Patient presents with   Tinea Pedis   68 y.o. male returns for the above complaint.  Patient presents with complaint of bilateral athlete's foot.  He states lotion helped a little bit he would like to discuss stronger options he is still causing some discomfort itching burning.  Objective:  There were no vitals filed for this visit. Podiatric Exam: Vascular: dorsalis pedis and posterior tibial pulses are palpable bilateral. Capillary return is immediate. Temperature gradient is WNL. Skin turgor WNL  Sensorium: Normal Semmes Weinstein monofilament test. Normal tactile sensation bilaterally. Nail Exam: Pt has thick disfigured discolored nails with subungual debris noted bilateral entire nail hallux through fifth toenails.  Pain on palpation to the nails. Ulcer Exam: There is no evidence of ulcer or pre-ulcerative changes or infection. Orthopedic Exam: Muscle tone and strength are WNL. No limitations in general ROM. No crepitus or effusions noted. HAV  B/L.  Hammer toes 2-5  B/L. Skin: No Porokeratosis. No infection or ulcers foot.  Skin epidermolysis noted with subjective component of itching.  No open wounds or lesion noted   Assessment & Plan:   1. Athlete's foot on right   2. Athlete's foot on left       Patient was evaluated and treated and all questions answered.  Athlete's foot -In the region the etiology arises with numerous treatment options were discussed.  Given the patient's athlete's foot keeps reoccurring he will benefit from 30 days of Lamisil .  He denies any liver issues.  He will also benefit from Lotrisone  cream which was sent to the pharmacy to be applied twice a day he states understand will do so immediately.   Franky Blanch, DPM    No follow-ups on file.

## 2024-07-19 ENCOUNTER — Emergency Department

## 2024-07-19 ENCOUNTER — Other Ambulatory Visit: Payer: Self-pay

## 2024-07-19 ENCOUNTER — Emergency Department
Admission: EM | Admit: 2024-07-19 | Discharge: 2024-07-19 | Disposition: A | Discharge status: Home / Self Care | Attending: Emergency Medicine | Admitting: Emergency Medicine

## 2024-07-19 DIAGNOSIS — I10 Essential (primary) hypertension: Secondary | ICD-10-CM | POA: Insufficient documentation

## 2024-07-19 DIAGNOSIS — W19XXXA Unspecified fall, initial encounter: Secondary | ICD-10-CM | POA: Insufficient documentation

## 2024-07-19 DIAGNOSIS — S80811A Abrasion, right lower leg, initial encounter: Secondary | ICD-10-CM | POA: Insufficient documentation

## 2024-07-19 DIAGNOSIS — J449 Chronic obstructive pulmonary disease, unspecified: Secondary | ICD-10-CM | POA: Insufficient documentation

## 2024-07-19 DIAGNOSIS — M25561 Pain in right knee: Secondary | ICD-10-CM | POA: Insufficient documentation

## 2024-07-19 DIAGNOSIS — E039 Hypothyroidism, unspecified: Secondary | ICD-10-CM | POA: Diagnosis not present

## 2024-07-19 DIAGNOSIS — S8991XA Unspecified injury of right lower leg, initial encounter: Secondary | ICD-10-CM | POA: Diagnosis present

## 2024-07-19 MED ORDER — OXYCODONE HCL 5 MG PO TABS
5.0000 mg | ORAL_TABLET | Freq: Once | ORAL | Status: AC
  Administered 2024-07-19: 5 mg via ORAL
  Filled 2024-07-19: qty 1

## 2024-07-19 NOTE — ED Notes (Signed)
 Pt provided with knee immobilizer by ED Tech.

## 2024-07-19 NOTE — ED Notes (Signed)
 Pt refused crutches. Pt discharged to ED circle at this time and left with all belongings. Pt ABCs intact. RR even and unlabored. Pt in NAD. Pt denies further needs from this RN.

## 2024-07-19 NOTE — ED Triage Notes (Signed)
 Pt to ED for R knee pain since yesterday after mechanical fall. Pain much worse since this morning.

## 2024-07-19 NOTE — ED Notes (Signed)
 Patient asked to inform the provider that he would rather go home and follow-up with his knee doctor on Monday. Shona Gin, PA-C aware.

## 2024-07-19 NOTE — ED Provider Notes (Signed)
 [facility] Provider Note    None    (approximate)   History   Knee Pain   HPI  [name]. is a 68 y.o. male presents to the ED with complaint of right knee pain after mechanical fall that happened yesterday.  Patient states that he was outside on an incline landing directly onto his knee.  Patient states that it is painful to bear weight.  No previous knee injury or surgeries.  Patient has history of COPD, sleep apnea, hypertension, prediabetes, chronic back pain, history of polysubstance abuse, non-STEMI, syncope, hypothyroidism, chronic pain syndrome.     Physical Exam   Triage Vital Signs: ED Triage Vitals  Encounter Vitals Group     BP 07/19/24 1542 (!) 169/81     Girls Systolic BP Percentile --      Girls Diastolic BP Percentile --      Boys Systolic BP Percentile --      Boys Diastolic BP Percentile --      Pulse Rate 07/19/24 1542 73     Resp 07/19/24 1542 16     Temp 07/19/24 1542 97.8 F (36.6 C)     Temp Source 07/19/24 1542 Oral     SpO2 07/19/24 1542 100 %     Weight 07/19/24 1544 (!) 312 lb (141.5 kg)     Height 07/19/24 1544 6' 3 (1.905 m)     Head Circumference --      Peak Flow --      Pain Score 07/19/24 1541 8     Pain Loc --      Pain Education --      Exclude from Growth Chart --     Most recent vital signs: Vitals:   07/19/24 1542  BP: (!) 169/81  Pulse: 73  Resp: 16  Temp: 97.8 F (36.6 C)  SpO2: 100%     General: Awake, no distress.  Alert, talkative, cooperative, hard of hearing. CV:  Good peripheral perfusion.  Resp:  Normal effort.  Abd:  No distention.  Other:  On examination of the right knee there is no gross deformity however range of motion is painful.  Patient has limited flexion and extension secondary to pain.  Skin directly over the patella is intact.  No ecchymosis or abrasions.  There is an abrasion anterior tibia proximal aspect.  No point tenderness on palpation of the tib-fib area or  midshaft.  No shortening or rotation is noted of the right lower extremity.  Patient was able to bear some weight for a very short period of time without difficulty.  He also was able to bend his leg and assist us  in taking his pants off.   ED Results / Procedures / Treatments   Labs (all labs ordered are listed, but only abnormal results are displayed) Labs Reviewed - No data to display   RADIOLOGY  Right knee x-ray images were reviewed by myself independent of the radiologist with noted degenerative changes.  Official radiology report is cortical irregularity within the ventral cortex lower pole patella on lateral view which may represent a visible fracture line or overlying soft tissue swelling.  Mild 3 compartmental osteoarthritis.    PROCEDURES:  Critical Care performed:   Procedures   MEDICATIONS ORDERED IN ED: Medications  oxyCODONE  (Oxy IR/ROXICODONE ) immediate release tablet 5 mg (5 mg Oral Given 07/19/24 1701)     IMPRESSION / MDM / ASSESSMENT AND PLAN / ED COURSE  I reviewed the triage  vital signs and the nursing notes.   Differential diagnosis includes, but is not limited to, contusion right knee, fracture, internal derangement, dislocation, acute exacerbation of degenerative joint disease secondary to fall.  68 year old male presents to the ED with complaint of right knee pain after a fall that occurred yesterday.  This was a mechanical fall outside.  Patient states the pain has worsened since waking up this morning.  He does have a prescription for Percocet 10 mg that he takes for chronic pain.  X-ray results was reviewed with the patient.  Because he is tender and having a great deal of pain in the knee area we will evaluate with a CT scan.  Patient was made aware.  He was given oxycodone  while here in the ED as his son dropped him off.  ----------------------------------------- 6:20 PM on 07/19/2024 ----------------------------------------- At this time [name], PA-C is assuming care for the remainder of this patient's ED visit.  Currently patient is in CT.    Patient's presentation is most consistent with acute complicated illness / injury requiring diagnostic workup.  FINAL CLINICAL IMPRESSION(S) / ED DIAGNOSES   Final diagnoses:  Acute pain of right knee  Fall, initial encounter     Rx / DC Orders   ED Discharge Orders     None        Note:  This document was prepared using Dragon voice recognition software and may include unintentional dictation errors.   Saunders Shona CROME, PA-C 07/19/24 1821    Floy Roberts, MD 07/19/24 [phone]

## 2024-07-19 NOTE — ED Provider Notes (Signed)
  Physical Exam  BP (!) 169/81 (BP Location: Right Arm)   Pulse 73   Temp 97.8 F (36.6 C) (Oral)   Resp 16   Ht 6' 3 (1.905 m)   Wt (!) 141.5 kg   SpO2 100%   BMI 39.00 kg/m   Physical Exam  Procedures  Procedures  ED Course / MDM    Medical Decision Making Amount and/or Complexity of Data Reviewed Radiology: ordered.  Risk Prescription drug management.   Assuming care of the patient from Shona Gin, PA-C at shift change.  Plan at this time is to assess CT to see if the fracture of the knee  CT of the right knee, independent review and interpretation by me as being negative for any acute abnormality  I did explain these findings to the patient.  Nursing staff placement knee immobilizer, given crutches.  Follow-up with orthopedics.  Patient was given instructions about arthritis.  Follow-up with his regular doctor as needed.  Discharged in stable condition.  He is in agreement treatment plan     [name] 07/19/24 [name] [name] Roberts, MD 07/19/24 2233

## 2024-07-19 NOTE — ED Notes (Signed)
 After receiving report on pt, pt came to nurses station, stating that he was ready to leave. Pt asked to go back to room to wait for supplies and discharge paperwork.
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
## 2024-08-23 ENCOUNTER — Other Ambulatory Visit: Payer: Self-pay

## 2024-08-23 ENCOUNTER — Emergency Department: Admission: EM | Admit: 2024-08-23 | Discharge: 2024-08-23 | Disposition: A | Discharge status: Home / Self Care

## 2024-08-23 ENCOUNTER — Emergency Department

## 2024-08-23 DIAGNOSIS — R110 Nausea: Secondary | ICD-10-CM | POA: Diagnosis not present

## 2024-08-23 DIAGNOSIS — I10 Essential (primary) hypertension: Secondary | ICD-10-CM | POA: Diagnosis not present

## 2024-08-23 DIAGNOSIS — R0602 Shortness of breath: Secondary | ICD-10-CM | POA: Diagnosis not present

## 2024-08-23 DIAGNOSIS — I2510 Atherosclerotic heart disease of native coronary artery without angina pectoris: Secondary | ICD-10-CM | POA: Insufficient documentation

## 2024-08-23 DIAGNOSIS — J449 Chronic obstructive pulmonary disease, unspecified: Secondary | ICD-10-CM | POA: Insufficient documentation

## 2024-08-23 DIAGNOSIS — Z951 Presence of aortocoronary bypass graft: Secondary | ICD-10-CM | POA: Insufficient documentation

## 2024-08-23 DIAGNOSIS — R079 Chest pain, unspecified: Secondary | ICD-10-CM | POA: Insufficient documentation

## 2024-08-23 LAB — CBC
HCT: 44.5 % (ref 39.0–52.0)
Hemoglobin: 15 g/dL (ref 13.0–17.0)
MCH: 31.4 pg (ref 26.0–34.0)
MCHC: 33.7 g/dL (ref 30.0–36.0)
MCV: 93.1 fL (ref 80.0–100.0)
Platelets: 253 K/uL (ref 150–400)
RBC: 4.78 MIL/uL (ref 4.22–5.81)
RDW: 12.6 % (ref 11.5–15.5)
WBC: 9 K/uL (ref 4.0–10.5)
nRBC: 0 % (ref 0.0–0.2)

## 2024-08-23 LAB — D-DIMER, QUANTITATIVE: D-Dimer, Quant: 0.33 ug{FEU}/mL (ref 0.00–0.50)

## 2024-08-23 LAB — LIPASE, BLOOD: Lipase: 60 U/L — ABNORMAL HIGH (ref 11–51)

## 2024-08-23 LAB — BASIC METABOLIC PANEL WITH GFR
Anion gap: 14 (ref 5–15)
BUN: 13 mg/dL (ref 8–23)
CO2: 21 mmol/L — ABNORMAL LOW (ref 22–32)
Calcium: 9.6 mg/dL (ref 8.9–10.3)
Chloride: 105 mmol/L (ref 98–111)
Creatinine, Ser: 0.96 mg/dL (ref 0.61–1.24)
GFR, Estimated: >60 mL/min
Glucose, Bld: 117 mg/dL — ABNORMAL HIGH (ref 70–99)
Potassium: 4.1 mmol/L (ref 3.5–5.1)
Sodium: 140 mmol/L (ref 135–145)

## 2024-08-23 LAB — HEPATIC FUNCTION PANEL
ALT: 9 U/L (ref 0–44)
AST: 17 U/L (ref 15–41)
Albumin: 4.4 g/dL (ref 3.5–5.0)
Alkaline Phosphatase: 63 U/L (ref 38–126)
Bilirubin, Direct: 0.1 mg/dL (ref 0.0–0.2)
Indirect Bilirubin: 0.2 mg/dL — ABNORMAL LOW (ref 0.3–0.9)
Total Bilirubin: 0.4 mg/dL (ref 0.0–1.2)
Total Protein: 7.5 g/dL (ref 6.5–8.1)

## 2024-08-23 LAB — TROPONIN T, HIGH SENSITIVITY
Troponin T High Sensitivity: <15 ng/L (ref 0–19)
Troponin T High Sensitivity: <15 ng/L (ref 0–19)

## 2024-08-23 MED ORDER — HYDROCODONE-ACETAMINOPHEN 10-325 MG PO TABS
1.0000 | ORAL_TABLET | Freq: Once | ORAL | Status: AC
  Administered 2024-08-23: 1 via ORAL
  Filled 2024-08-23: qty 1

## 2024-08-23 MED ORDER — MORPHINE SULFATE (PF) 4 MG/ML IV SOLN
4.0000 mg | Freq: Once | INTRAVENOUS | Status: AC
  Administered 2024-08-23: 4 mg via INTRAVENOUS
  Filled 2024-08-23: qty 1

## 2024-08-23 MED ORDER — ASPIRIN 81 MG PO CHEW
324.0000 mg | CHEWABLE_TABLET | Freq: Once | ORAL | Status: AC
  Administered 2024-08-23: 324 mg via ORAL
  Filled 2024-08-23: qty 4

## 2024-08-23 NOTE — ED Triage Notes (Addendum)
 Pt to ED via POV from home. Pt reports left sided CP, SOB and nausea x4hrs. Cardiac hx  MI and CHF  Pt took 2 nitroglycerin  today PTA with no relief

## 2024-08-23 NOTE — Discharge Instructions (Signed)
 You were seen in the emergency department for an episode of chest pain.  Workup today was reassuring and at this time you are safe to return home.  Continue your regular medications.  Stick to a bland diet.  Please call your cardiology team tomorrow morning.  Return with any acutely worsening symptoms or any other emergency. -- RETURN PRECAUTIONS & AFTERCARE: (ENGLISH) RETURN PRECAUTIONS: Return immediately to the emergency department or see/call your doctor if you feel worse, weak or have changes in speech or vision, are short of breath, have fever, vomiting, pain, bleeding or dark stool, trouble urinating or any new issues. Return here or see/call your doctor if not improving as expected for your suspected condition. FOLLOW-UP CARE: Call your doctor and/or any doctors we referred you to for more advice and to make an appointment. Do this today, tomorrow or after the weekend. Some doctors only take PPO insurance so if you have HMO insurance you may want to contact your HMO or your regular doctor for referral to a specialist within your plan. Either way tell the doctor's office that it was a referral from the emergency department so you get the soonest possible appointment.  YOUR TEST RESULTS: Take result reports of any blood or urine tests, imaging tests and EKG's to your doctor and any referral doctor. Have any abnormal tests repeated. Your doctor or a referral doctor can let you know when this should be done. Also make sure your doctor contacts this hospital to get any test results that are not currently available such as cultures or special tests for infection and final imaging reports, which are often not available at the time you leave the ER but which may list additional important findings that are not documented on the preliminary report. BLOOD PRESSURE: If your blood pressure was greater than 120/80 have your blood pressure rechecked within 1 to 2 weeks. MEDICATION SIDE EFFECTS: Do not drive, walk,  bike, take the bus, etc. if you have received or are being prescribed any sedating medications such as those for pain or anxiety or certain antihistamines like Benadryl . If you have been give one of these here get a taxi home or have a friend drive you home. Ask your pharmacist to counsel you on potential side effects of any new medication

## 2024-08-23 NOTE — ED Provider Notes (Signed)
 "  [facility] Provider Note    Event Date/Time   First MD Initiated Contact with Patient 08/23/24 1546     (approximate)   History   Chest Pain   HPI  [name] SELLS Sr. is a 69 y.o. male CAD status post CABG, COPD, chronic pain, hypertension, GAD, GERD, history of EtOH abuse who presents to the emergency department with an episode of chest pain and shortness of breath along with nausea that initiated 4 hours ago.  Patient states that he was sitting in his recliner and developed left-sided chest pain.  He took 2 nitroglycerin  without relief and subsequently decided to go to the emergency department.  He reports compliance with his medications.  He last ate 1 hour prior to initiating the chest pain.  Denies any fevers chills cough congestion abdominal pain changes in urinary or bowel habits.  Denies any lightheadedness.  Since arriving in the emergency department he reports that his chest pain is dissipated and he is symptom-free.  Last seen cardiology on 12/14/2022 ([facility] Dr. Camellia Ada).  He states that he missed his appointment in November secondary to a COVID exacerbation     Physical Exam   Triage Vital Signs: ED Triage Vitals [08/23/24 1455]  Encounter Vitals Group     BP (!) 127/90     Girls Systolic BP Percentile      Girls Diastolic BP Percentile      Boys Systolic BP Percentile      Boys Diastolic BP Percentile      Pulse Rate 82     Resp 20     Temp 98 F (36.7 C)     Temp Source Oral     SpO2 98 %     Weight      Height      Head Circumference      Peak Flow      Pain Score 8     Pain Loc      Pain Education      Exclude from Growth Chart     Most recent vital signs: Vitals:   08/23/24 1603 08/23/24 1834  BP:  118/75  Pulse:  84  Resp:  20  Temp:  97.8 F (36.6 C)  SpO2: 100% 99%    Nursing Triage Note reviewed. Vital signs reviewed and patients oxygen  saturation is normoxic  General: Patient is well nourished, well  developed, awake and alert, resting comfortably in no acute distress Head: Normocephalic and atraumatic Eyes: Normal inspection, extraocular muscles intact, no conjunctival pallor Ear, nose, throat: Normal external exam Neck: Normal range of motion Respiratory: Patient is in no respiratory distress, lungs CTAB Cardiovascular: Patient is not tachycardic, RRR without murmur appreciated GI: Abd SNT with no guarding or rebound  Back: Normal inspection of the back with good strength and range of motion throughout all ext Extremities: pulses intact with good cap refills, no LE pitting edema or calf tenderness Neuro: The patient is alert and oriented to person, place, and time, appropriately conversive, with 5/5 bilat UE/LE strength, no gross motor or sensory defects noted. Coordination appears to be adequate. Skin: Warm, dry, and intact Psych: normal mood and affect, no SI or HI  ED Results / Procedures / Treatments   Labs (all labs ordered are listed, but only abnormal results are displayed) Labs Reviewed  BASIC METABOLIC PANEL WITH GFR - Abnormal; Notable for the following components:      Result Value   CO2 21 (*)  Glucose, Bld 117 (*)    All other components within normal limits  LIPASE, BLOOD - Abnormal; Notable for the following components:   Lipase 60 (*)    All other components within normal limits  HEPATIC FUNCTION PANEL - Abnormal; Notable for the following components:   Indirect Bilirubin 0.2 (*)    All other components within normal limits  CBC  D-DIMER, QUANTITATIVE  TROPONIN T, HIGH SENSITIVITY  TROPONIN T, HIGH SENSITIVITY     EKG EKG and rhythm strip are interpreted by myself:   EKG: [Normal sinus rhythm] at heart rate of 83, normal QRS duration, QTc 460, normal ST segments and T waves no ectopy EKG not consistent with Acute STEMI Rhythm strip: NSR in lead II   RADIOLOGY CXR: No acute abnormality on my independent review interpretation and radiologist  agrees    PROCEDURES:  Critical Care performed: No  Procedures   MEDICATIONS ORDERED IN ED: Medications  aspirin  chewable tablet 324 mg (324 mg Oral Given 08/23/24 1608)  morphine  (PF) 4 MG/ML injection 4 mg (4 mg Intravenous Given 08/23/24 1609)  HYDROcodone -acetaminophen  (NORCO) 10-325 MG per tablet 1 tablet (1 tablet Oral Given 08/23/24 1826)     IMPRESSION / MDM / ASSESSMENT AND PLAN / ED COURSE           HEART Score: 5                     Differential diagnosis includes, but is not limited to, atypical ACS, PE, pneumonia, GERD, musculoskeletal  ED course: Patient presents and he is well-appearing and EKG demonstrates no evidence of acute ischemia.  He did receive 324 mg of aspirin  on arrival and morphine  as well.  He received 2 high-sensitivity troponins 2 hours apart both which were not elevated. His heart score was 5 which was in the intermediate range, mainly risk factor driven.  I am reassured that he is not having chest pain currently.  I did consider PE but patient's D-dimer was not elevated.  Chest x-ray revealed no pneumonia or pneumothorax.  At this time I do not of a cause for the patient's symptoms but I am reassured by his workup.  I do think he is safe for discharge home today and he will call his cardiologist office at [facility] tomorrow.  Advised him to return with any acutely worsening symptoms  Clinical Course as of 08/23/24 1847  Thu Aug 23, 2024  1710 D-Dimer, Quant: 0.33 Not elevated [HD]  1814 Patient reassessed and he feels improved.  He feels reassured by the workup thus far.  He requests his home opioid medication. [HD]  1819 Troponin T High Sensitivity: <15 Second troponin not elevated [HD]  1822 Patient reassessed and continues to do well and is asymptomatic.  At this time I reviewed with him the need to follow-up with his outpatient cardiologist stick to his regular medications return with any acutely worsening symptoms he.  He voiced understanding and  requested discharge [HD]  1847 Basic metabolic panel(!) No profound electrolyte derangements [HD]  1847 Hepatic function panel(!) No elevated LFTs [HD]  1847 CBC No anemia or leukocytosis [HD]    Clinical Course User Index [HD] Nicholaus Rolland BRAVO, MD   At time of discharge there is no evidence of acute life, limb, vision, or fertility threat. Patient has stable vital signs, pain is well controlled, patient is ambulatory and p.o. tolerant.  Discharge instructions were completed using the EPIC system. I would refer you to those at  this time. All warnings prescriptions follow-up etc. were discussed in detail with the patient. Patient indicates understanding and is agreeable with this plan. All questions answered.  Patient is made aware that they may return to the emergency department for any worsening or new condition or for any other emergency.  -- Risk: 5 This patient has a high risk of morbidity due to further diagnostic testing or treatment. Rationale: This patients evaluation and management involve a high risk of morbidity due to the potential severity of presenting symptoms, need for diagnostic testing, and/or initiation of treatment that may require close monitoring. The differential includes conditions with potential for significant deterioration or requiring escalation of care. Treatment decisions in the ED, including medication administration, procedural interventions, or disposition planning, reflect this level of risk. COPA: 5 The patient has the following acute or chronic illness/injury that poses a possible threat to life or bodily function: [X] : The patient has a potentially serious acute condition or an acute exacerbation of a chronic illness requiring urgent evaluation and management in the Emergency Department. The clinical presentation necessitates immediate consideration of life-threatening or function-threatening diagnoses, even if they are ultimately ruled out.   FINAL  CLINICAL IMPRESSION(S) / ED DIAGNOSES   Final diagnoses:  Nonspecific chest pain     Rx / DC Orders   ED Discharge Orders     None        Note:  This document was prepared using Dragon voice recognition software and may include unintentional dictation errors.   Nicholaus Rolland BRAVO, MD 08/23/24 [phone]  "
